# Patient Record
Sex: Male | Born: 1950
Health system: Southern US, Community
[De-identification: ages and names within clinical notes are randomized; demographics above are authoritative.]

## PROBLEM LIST (undated history)

## (undated) DIAGNOSIS — H269 Unspecified cataract: Secondary | ICD-10-CM

## (undated) DIAGNOSIS — E119 Type 2 diabetes mellitus without complications: Secondary | ICD-10-CM

## (undated) DIAGNOSIS — Z9989 Dependence on other enabling machines and devices: Secondary | ICD-10-CM

## (undated) DIAGNOSIS — G20 Parkinson's disease: Secondary | ICD-10-CM

## (undated) DIAGNOSIS — M51369 Other intervertebral disc degeneration, lumbar region without mention of lumbar back pain or lower extremity pain: Secondary | ICD-10-CM

## (undated) DIAGNOSIS — G4730 Sleep apnea, unspecified: Secondary | ICD-10-CM

## (undated) DIAGNOSIS — K219 Gastro-esophageal reflux disease without esophagitis: Secondary | ICD-10-CM

## (undated) DIAGNOSIS — G709 Myoneural disorder, unspecified: Secondary | ICD-10-CM

## (undated) DIAGNOSIS — I10 Essential (primary) hypertension: Secondary | ICD-10-CM

## (undated) DIAGNOSIS — G20A1 Parkinson's disease without dyskinesia, without mention of fluctuations: Secondary | ICD-10-CM

## (undated) DIAGNOSIS — F419 Anxiety disorder, unspecified: Secondary | ICD-10-CM

## (undated) DIAGNOSIS — J189 Pneumonia, unspecified organism: Secondary | ICD-10-CM

## (undated) DIAGNOSIS — J45909 Unspecified asthma, uncomplicated: Secondary | ICD-10-CM

## (undated) DIAGNOSIS — G4733 Obstructive sleep apnea (adult) (pediatric): Secondary | ICD-10-CM

## (undated) DIAGNOSIS — E785 Hyperlipidemia, unspecified: Secondary | ICD-10-CM

## (undated) DIAGNOSIS — M5136 Other intervertebral disc degeneration, lumbar region: Secondary | ICD-10-CM

## (undated) DIAGNOSIS — T7840XA Allergy, unspecified, initial encounter: Secondary | ICD-10-CM

## (undated) HISTORY — DX: Anxiety disorder, unspecified: F41.9

## (undated) HISTORY — DX: Unspecified asthma, uncomplicated: J45.909

## (undated) HISTORY — PX: JOINT REPLACEMENT: SHX530

## (undated) HISTORY — PX: SHOULDER ARTHROSCOPY W/ ROTATOR CUFF REPAIR: SHX2400

## (undated) HISTORY — DX: Sleep apnea, unspecified: G47.30

## (undated) HISTORY — PX: FRACTURE SURGERY: SHX138

## (undated) HISTORY — DX: Parkinson's disease: G20

## (undated) HISTORY — DX: Other intervertebral disc degeneration, lumbar region without mention of lumbar back pain or lower extremity pain: M51.369

## (undated) HISTORY — DX: Myoneural disorder, unspecified: G70.9

## (undated) HISTORY — DX: Dependence on other enabling machines and devices: Z99.89

## (undated) HISTORY — DX: Allergy, unspecified, initial encounter: T78.40XA

## (undated) HISTORY — DX: Gastro-esophageal reflux disease without esophagitis: K21.9

## (undated) HISTORY — DX: Parkinson's disease without dyskinesia, without mention of fluctuations: G20.A1

## (undated) HISTORY — DX: Hyperlipidemia, unspecified: E78.5

## (undated) HISTORY — DX: Unspecified cataract: H26.9

## (undated) HISTORY — DX: Obstructive sleep apnea (adult) (pediatric): G47.33

## (undated) HISTORY — PX: URETERAL STENT PLACEMENT: SHX822

## (undated) HISTORY — DX: Essential (primary) hypertension: I10

## (undated) HISTORY — DX: Other intervertebral disc degeneration, lumbar region: M51.36

---
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
## 1980-08-12 HISTORY — PX: NASAL SEPTUM SURGERY: SHX37

## 1999-08-13 HISTORY — PX: CARPAL TUNNEL RELEASE: SHX101

## 2002-01-05 ENCOUNTER — Ambulatory Visit (HOSPITAL_BASED_OUTPATIENT_CLINIC_OR_DEPARTMENT_OTHER): Admission: RE | Admit: 2002-01-05 | Discharge: 2002-01-06 | Payer: Self-pay | Admitting: Orthopaedic Surgery

## 2006-03-04 ENCOUNTER — Ambulatory Visit (HOSPITAL_COMMUNITY): Admission: RE | Admit: 2006-03-04 | Discharge: 2006-03-05 | Payer: Self-pay | Admitting: Orthopaedic Surgery

## 2006-08-30 ENCOUNTER — Emergency Department (HOSPITAL_COMMUNITY): Admission: EM | Admit: 2006-08-30 | Discharge: 2006-08-30 | Payer: Self-pay | Admitting: Emergency Medicine

## 2006-09-05 ENCOUNTER — Ambulatory Visit (HOSPITAL_BASED_OUTPATIENT_CLINIC_OR_DEPARTMENT_OTHER): Admission: RE | Admit: 2006-09-05 | Discharge: 2006-09-05 | Payer: Self-pay | Admitting: Urology

## 2006-09-19 ENCOUNTER — Ambulatory Visit (HOSPITAL_BASED_OUTPATIENT_CLINIC_OR_DEPARTMENT_OTHER): Admission: RE | Admit: 2006-09-19 | Discharge: 2006-09-19 | Payer: Self-pay | Admitting: Urology

## 2010-08-12 DIAGNOSIS — I10 Essential (primary) hypertension: Secondary | ICD-10-CM

## 2010-08-12 HISTORY — DX: Essential (primary) hypertension: I10

## 2010-12-20 ENCOUNTER — Encounter: Payer: Self-pay | Admitting: Family Medicine

## 2010-12-20 ENCOUNTER — Ambulatory Visit (INDEPENDENT_AMBULATORY_CARE_PROVIDER_SITE_OTHER): Payer: BC Managed Care – PPO | Admitting: Family Medicine

## 2010-12-20 DIAGNOSIS — R5383 Other fatigue: Secondary | ICD-10-CM | POA: Insufficient documentation

## 2010-12-20 DIAGNOSIS — R7301 Impaired fasting glucose: Secondary | ICD-10-CM

## 2010-12-20 DIAGNOSIS — E785 Hyperlipidemia, unspecified: Secondary | ICD-10-CM | POA: Insufficient documentation

## 2010-12-20 DIAGNOSIS — R635 Abnormal weight gain: Secondary | ICD-10-CM | POA: Insufficient documentation

## 2010-12-20 DIAGNOSIS — R5381 Other malaise: Secondary | ICD-10-CM

## 2010-12-20 DIAGNOSIS — I10 Essential (primary) hypertension: Secondary | ICD-10-CM | POA: Insufficient documentation

## 2010-12-20 MED ORDER — PHENTERMINE HCL 15 MG PO CAPS: 15.0000 mg | ORAL_CAPSULE | ORAL | Status: DC

## 2010-12-20 MED ORDER — SIMVASTATIN 10 MG PO TABS: 10.0000 mg | ORAL_TABLET | Freq: Every day | ORAL | Status: DC

## 2010-12-20 MED ORDER — OLMESARTAN MEDOXOMIL-HCTZ 40-25 MG PO TABS: 1.0000 | ORAL_TABLET | Freq: Every day | ORAL | Status: DC

## 2010-12-20 NOTE — Assessment & Plan Note (Signed)
BMI 46 with co-morbidities of OSA, dyslipidemia, IFG, HTN and LBP. He is very motivated to make changes but has struggled with weight regain and the habit of snacking at night.  We discussed the needed diet changes and plain for exercise in his routine.  Will add low dose phentermine 15 mg 2 hrs after breakfast that way it covers his late night snacking but hopefully does not cause insomnia.  Covered the common adverse SEs and he will call if any problems.  RTC for nurse BP/WT/ HR check in 1 month and to see me in 2 mos.  Goal for wt loss: 1-2 lbs/ wk.

## 2010-12-20 NOTE — Patient Instructions (Addendum)
EKG today. OK   Update fasting labs one morning downstairs. Will call you w/ results.  Start Phentermine 15 mg tab once daily (take 2 hrs after breakfast).  Return in 1 month for a nurse WT/ BP check and in 2 mos to see me. Call if any problems.

## 2010-12-20 NOTE — Assessment & Plan Note (Addendum)
EKG done today shows 63  NSR at bpm.  Left axis deviation, Qtc 413 ms, PR 188 ms, no sign of ischemia or arrhytmia. BP well controlled on Benicar/ HCTZ.  Denies any symptoms of angina and EKG is normal.  Definitely needs to work on weight loss, focusing on lean proteins, lots of veggies, some fruit, limited meals out and processed foods ( to reduce sodium intake).    Will update labs today and carefully watch his BP/ HR on phentermine.

## 2010-12-20 NOTE — Progress Notes (Signed)
Subjective:    Patient ID: Jonathan Yu, male    DOB: 1951/02/27, 60 y.o.   MRN: [id number]  HPI 60 yo WM presents for NOV.  He has a hx of IFG, HTN and dyslipidemia.  He is struggling to lose weight.  Last year, after being diagnosed with IFG, he lost 35 lbs on his own but has regained 30 lbs.  He denies chest pain or DOE.  He is doing well on his current meds, not on anything for his sugar.  His AM fastings are 110-160s.  Admits to a fairly poor diet and late night snacking.  Time is a factor.  He works long hrs and has a 58 yr old at home.  He would like to start going back to the gym to do weights and cardio.  He has lumbar DDD -- improved by seeing a chiropractor and has had RTC syndrome bilat which has kept him from lifting wts.  He tried OTC SENSA but it has not helped him lose weight.  Denies tremor, insomnia or heart palpitations.  Had labs done in 2011.  C/O feeling more tired since gaining weight.  BP 125/77  Pulse 85  Ht 5\' 6"  (1.676 m)  Wt 289 lb (131.09 kg)  BMI 46.65 kg/m2  SpO2 96%  Past Medical History  Diagnosis Date  . Allergy   . Hyperlipidemia   . Hypertension   . Diabetes mellitus   . DDD (degenerative disc disease), lumbar   . OSA on CPAP     Past Surgical History  Procedure Date  . Rtc repair   . Ureteral stent     Family History  Problem Relation Age of Onset  . Cancer Mother 73    Lung    History   Social History  . Marital Status: Single    Spouse Name: N/A    Number of Children: 5  . Years of Education: N/A   Occupational History  . sales/ DJ    Social History Main Topics  . Smoking status: Current Some Day Smoker    Types: Pipe, Cigars  . Smokeless tobacco: Not on file  . Alcohol Use: Yes  . Drug Use: No  . Sexually Active: Yes   Other Topics Concern  . Not on file   Social History Narrative  . No narrative on file    Not on File  Current outpatient prescriptions:Ascorbic Acid (VITAMIN C) 1000 MG tablet, Take 1,000 mg by  mouth daily.  , Disp: , Rfl: ;  aspirin 81 MG tablet, Take 81 mg by mouth daily.  , Disp: , Rfl: ;  cyclobenzaprine (FLEXERIL) 10 MG tablet, Take 10 mg by mouth at bedtime as needed.  , Disp: , Rfl: ;  DHEA 50 MG CAPS, Take by mouth.  , Disp: , Rfl: ;  fish oil-omega-3 fatty acids 1000 MG capsule, Take 2 g by mouth daily.  , Disp: , Rfl:  fluticasone (FLONASE) 50 MCG/ACT nasal spray, 2 sprays by Nasal route daily.  , Disp: , Rfl: ;  ibuprofen (ADVIL,MOTRIN) 800 MG tablet, Take 800 mg by mouth every 8 (eight) hours as needed.  , Disp: , Rfl: ;  Loratadine 10 MG CAPS, Take by mouth 2 (two) times daily.  , Disp: , Rfl: ;  Minoxidil (ROGAINE EXTRA STRENGTH FOR MEN EX), Apply topically.  , Disp: , Rfl:  olmesartan-hydrochlorothiazide (BENICAR HCT) 40-25 MG per tablet, Take 1 tablet by mouth daily., Disp: 90 tablet, Rfl: 1;  simvastatin (ZOCOR) 10  MG tablet, Take 1 tablet (10 mg total) by mouth at bedtime., Disp: 90 tablet, Rfl: 0;  Vitamin Mixture (CHROMIUM PICOLINATE PO), Take by mouth.  , Disp: , Rfl: ;  DISCONTD: olmesartan-hydrochlorothiazide (BENICAR HCT) 40-25 MG per tablet, Take 1 tablet by mouth daily.  , Disp: , Rfl:  DISCONTD: simvastatin (ZOCOR) 10 MG tablet, Take 10 mg by mouth at bedtime.  , Disp: , Rfl: ;  fexofenadine (ALLEGRA) 180 MG tablet, Take 180 mg by mouth daily.  , Disp: , Rfl: ;  phentermine 15 MG capsule, Take 1 capsule (15 mg total) by mouth every morning., Disp: 30 capsule, Rfl: 0    Review of Systems  Constitutional: Positive for fatigue and unexpected weight change. Negative for appetite change. Fever: gain.  Eyes: Negative for visual disturbance.  Respiratory: Negative for shortness of breath.   Cardiovascular: Negative for chest pain, palpitations and leg swelling.  Gastrointestinal: Negative for nausea, abdominal pain and diarrhea.  Genitourinary: Negative for frequency, decreased urine volume and difficulty urinating.  Musculoskeletal: Positive for back pain.  Neurological:  Negative for numbness and headaches.  Psychiatric/Behavioral: Negative for sleep disturbance and dysphoric mood. The patient is not nervous/anxious.         Objective:   Physical Exam  Constitutional: He appears well-developed and well-nourished.       Morbidly obese  Eyes: Conjunctivae are normal. No scleral icterus.  Neck: Neck supple. No thyromegaly present.  Cardiovascular: Normal rate, regular rhythm and normal heart sounds.   No murmur heard. Pulmonary/Chest: Effort normal and breath sounds normal. No respiratory distress. He has no wheezes.  Abdominal: Soft. Bowel sounds are normal. He exhibits no distension. There is no tenderness.  Musculoskeletal: He exhibits no edema.  Lymphadenopathy:    He has no cervical adenopathy.  Skin: Skin is warm and dry.  Psychiatric: He has a normal mood and affect.          Assessment & Plan:

## 2010-12-20 NOTE — Assessment & Plan Note (Signed)
Update fasting labs. On Zocor.  Will definitely need to work on diet/ exercise/ wt loss.  If seeing significant improvements in weight in 6 mos, will recheck labs.

## 2010-12-20 NOTE — Assessment & Plan Note (Signed)
Will get an A1C with his labs today.  If > 6.5. Would start metformin.  Work on diet, exercise, wt loss essential.

## 2010-12-21 ENCOUNTER — Emergency Department (HOSPITAL_BASED_OUTPATIENT_CLINIC_OR_DEPARTMENT_OTHER)
Admission: EM | Admit: 2010-12-21 | Discharge: 2010-12-21 | Disposition: A | Discharge status: Home / Self Care | Payer: No Typology Code available for payment source | Attending: Emergency Medicine | Admitting: Emergency Medicine

## 2010-12-21 DIAGNOSIS — I10 Essential (primary) hypertension: Secondary | ICD-10-CM | POA: Insufficient documentation

## 2010-12-21 DIAGNOSIS — M549 Dorsalgia, unspecified: Secondary | ICD-10-CM | POA: Insufficient documentation

## 2010-12-21 DIAGNOSIS — Y92410 Unspecified street and highway as the place of occurrence of the external cause: Secondary | ICD-10-CM | POA: Insufficient documentation

## 2010-12-21 DIAGNOSIS — E7800 Pure hypercholesterolemia, unspecified: Secondary | ICD-10-CM | POA: Insufficient documentation

## 2010-12-21 DIAGNOSIS — R109 Unspecified abdominal pain: Secondary | ICD-10-CM | POA: Insufficient documentation

## 2010-12-21 LAB — URINALYSIS, ROUTINE W REFLEX MICROSCOPIC
Bilirubin Urine: NEGATIVE
Glucose, UA: NEGATIVE mg/dL
Hgb urine dipstick: NEGATIVE
Ketones, ur: NEGATIVE mg/dL
Nitrite: NEGATIVE
Protein, ur: NEGATIVE mg/dL
Specific Gravity, Urine: 1.017 (ref 1.005–1.030)
Urobilinogen, UA: 0.2 mg/dL (ref 0.0–1.0)
pH: 7 (ref 5.0–8.0)

## 2011-01-09 ENCOUNTER — Encounter: Payer: Self-pay | Admitting: Family Medicine

## 2011-01-15 ENCOUNTER — Telehealth: Payer: Self-pay | Admitting: Family Medicine

## 2011-01-15 LAB — COMPLETE METABOLIC PANEL WITH GFR
ALT: 41 U/L (ref 0–53)
AST: 23 U/L (ref 0–37)
Albumin: 4.4 g/dL (ref 3.5–5.2)
Alkaline Phosphatase: 45 U/L (ref 39–117)
BUN: 15 mg/dL (ref 6–23)
CO2: 30 mEq/L (ref 19–32)
Calcium: 9.9 mg/dL (ref 8.4–10.5)
Chloride: 101 mEq/L (ref 96–112)
Creat: 0.98 mg/dL (ref 0.50–1.35)
GFR, Est African American: >60 mL/min (ref >60)
GFR, Est Non African American: >60 mL/min (ref >60)
Glucose, Bld: 133 mg/dL — ABNORMAL HIGH (ref 70–99)
Potassium: 4.4 mEq/L (ref 3.5–5.3)
Sodium: 139 mEq/L (ref 135–145)
Total Bilirubin: 1.4 mg/dL — ABNORMAL HIGH (ref 0.3–1.2)
Total Protein: 7 g/dL (ref 6.0–8.3)

## 2011-01-15 LAB — CBC WITH DIFFERENTIAL/PLATELET
Basophils Absolute: 0 10*3/uL (ref 0.0–0.1)
Basophils Relative: 0 % (ref 0–1)
Eosinophils Absolute: 0.2 10*3/uL (ref 0.0–0.7)
Eosinophils Relative: 3 % (ref 0–5)
HCT: 47.7 % (ref 39.0–52.0)
Hemoglobin: 15.6 g/dL (ref 13.0–17.0)
Lymphocytes Relative: 41 % (ref 12–46)
Lymphs Abs: 3.1 10*3/uL (ref 0.7–4.0)
MCH: 29.7 pg (ref 26.0–34.0)
MCHC: 32.7 g/dL (ref 30.0–36.0)
MCV: 90.7 fL (ref 78.0–100.0)
Monocytes Absolute: 0.6 10*3/uL (ref 0.1–1.0)
Monocytes Relative: 8 % (ref 3–12)
Neutro Abs: 3.6 10*3/uL (ref 1.7–7.7)
Neutrophils Relative %: 48 % (ref 43–77)
Platelets: 270 10*3/uL (ref 150–400)
RBC: 5.26 MIL/uL (ref 4.22–5.81)
RDW: 12.9 % (ref 11.5–15.5)
WBC: 7.6 10*3/uL (ref 4.0–10.5)

## 2011-01-15 LAB — TESTOSTERONE, FREE, TOTAL, SHBG
Sex Hormone Binding: 17 nmol/L (ref 13–71)
Testosterone, Free: 55.5 pg/mL (ref 47.0–244.0)
Testosterone-% Free: 2.7 % (ref 1.6–2.9)
Testosterone: 209.5 ng/dL — ABNORMAL LOW (ref 250–890)

## 2011-01-15 LAB — LIPID PANEL
Cholesterol: 174 mg/dL (ref 0–200)
HDL: 37 mg/dL — ABNORMAL LOW (ref >39)
LDL Cholesterol: 87 mg/dL (ref 0–99)
Total CHOL/HDL Ratio: 4.7 Ratio
Triglycerides: 251 mg/dL — ABNORMAL HIGH (ref <150)
VLDL: 50 mg/dL — ABNORMAL HIGH (ref 0–40)

## 2011-01-15 LAB — HEMOGLOBIN A1C
Hgb A1c MFr Bld: 6.8 % — ABNORMAL HIGH (ref <5.7)
Mean Plasma Glucose: 148 mg/dL — ABNORMAL HIGH (ref <117)

## 2011-01-15 LAB — TSH: TSH: 1.39 u[IU]/mL (ref 0.350–4.500)

## 2011-01-15 LAB — VITAMIN B12: Vitamin B-12: 525 pg/mL (ref 211–911)

## 2011-01-15 NOTE — Telephone Encounter (Signed)
LMOM for Pt to CB to schedule OV to discuss labs

## 2011-01-15 NOTE — Telephone Encounter (Signed)
Pls call pt and schedule him an office visit in the next wk to review lab results in person.

## 2011-01-23 ENCOUNTER — Ambulatory Visit: Payer: BC Managed Care – PPO | Admitting: Family Medicine

## 2011-01-24 ENCOUNTER — Encounter: Payer: Self-pay | Admitting: Family Medicine

## 2011-01-24 ENCOUNTER — Ambulatory Visit (INDEPENDENT_AMBULATORY_CARE_PROVIDER_SITE_OTHER): Payer: BC Managed Care – PPO | Admitting: Family Medicine

## 2011-01-24 VITALS — BP 119/70 | HR 78 | Ht 66.5 in | Wt 284.0 lb

## 2011-01-24 DIAGNOSIS — E291 Testicular hypofunction: Secondary | ICD-10-CM

## 2011-01-24 DIAGNOSIS — G4733 Obstructive sleep apnea (adult) (pediatric): Secondary | ICD-10-CM | POA: Insufficient documentation

## 2011-01-24 DIAGNOSIS — M5416 Radiculopathy, lumbar region: Secondary | ICD-10-CM | POA: Insufficient documentation

## 2011-01-24 DIAGNOSIS — IMO0002 Reserved for concepts with insufficient information to code with codable children: Secondary | ICD-10-CM

## 2011-01-24 DIAGNOSIS — E349 Endocrine disorder, unspecified: Secondary | ICD-10-CM | POA: Insufficient documentation

## 2011-01-24 DIAGNOSIS — E119 Type 2 diabetes mellitus without complications: Secondary | ICD-10-CM | POA: Insufficient documentation

## 2011-01-24 DIAGNOSIS — Z9989 Dependence on other enabling machines and devices: Secondary | ICD-10-CM | POA: Insufficient documentation

## 2011-01-24 DIAGNOSIS — I10 Essential (primary) hypertension: Secondary | ICD-10-CM

## 2011-01-24 DIAGNOSIS — E785 Hyperlipidemia, unspecified: Secondary | ICD-10-CM

## 2011-01-24 MED ORDER — PHENTERMINE HCL 30 MG PO CAPS: 30.0000 mg | ORAL_CAPSULE | ORAL | Status: DC

## 2011-01-24 MED ORDER — METFORMIN HCL ER 500 MG PO TB24: 500.0000 mg | ORAL_TABLET | Freq: Every day | ORAL | Status: DC
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
## 2011-01-24 NOTE — Assessment & Plan Note (Addendum)
BP looks great on Benicar/ HCTZ.  Labs recently done.  No increase in BP/ HR on Phentermine.

## 2011-01-24 NOTE — Assessment & Plan Note (Signed)
Doing well on Zocor but has high TGs and a low HDL which are diet/ obesity related.  Plan to recheck once > 20 lbs lost.
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
## 2011-01-24 NOTE — Assessment & Plan Note (Signed)
His testosterone (total ) did come back low along with a low normal SHBG (likely from obesity) and a low normal free testerosterone.  Will hold out on treatment at this time since we have other new meds to start today.  F/u in 1 month.

## 2011-01-24 NOTE — Assessment & Plan Note (Addendum)
BMi 47 c/w class III obesity complicated by T2DM, dyslipidemia, HTN, OSA. BP/HR good on phentermine and it did help him cut back on portion sizes.  New RX for 30 mg given.  F/u in 1 month.  Use this along with a low sugar/ low carb diet, avoiding late night snacking and aim for 1 hr exercise 4-5 days/ wk.

## 2011-01-24 NOTE — Assessment & Plan Note (Signed)
Newly diagnosed.  A1C is 6.8.  We discussed what this means.  He has a glucometer at home and will start checking an AM fasting and 2 hr after dinner reading.  Will set him up to see a nutritionist.  His dilated eye exam is UTD. Plan to do a Pneumovax, urine microalbumin and monofilament at his next visit.    Start metformin XR 500 mg once daily with breakfast.  Work on diet/ exercise/ wt loss.

## 2011-01-24 NOTE — Patient Instructions (Signed)
MRI L spine ordered for this weekend.  Stay on Advil as needed for pain (800 mg 3 x a day max) for now.  Start Metformin 500 mg XR with breakfast daily for sugars.  Dietary referral made for nutrition counseling. Work on low sugar/ low carb diet and aim for 1 hr of exercise 4- 5 days/ wk.  Add Phentermine each AM, 30 min before breakfast. Call me if any problems.  Return for f/u visit in 1 month.

## 2011-01-25 ENCOUNTER — Other Ambulatory Visit: Payer: BC Managed Care – PPO

## 2011-01-28 ENCOUNTER — Encounter: Payer: Self-pay | Admitting: Family Medicine

## 2011-01-28 ENCOUNTER — Ambulatory Visit
Admission: RE | Admit: 2011-01-28 | Discharge: 2011-01-28 | Disposition: A | Discharge status: Home / Self Care | Payer: BC Managed Care – PPO | Source: Ambulatory Visit | Attending: Family Medicine | Admitting: Family Medicine

## 2011-01-28 DIAGNOSIS — N200 Calculus of kidney: Secondary | ICD-10-CM | POA: Insufficient documentation

## 2011-01-28 DIAGNOSIS — E119 Type 2 diabetes mellitus without complications: Secondary | ICD-10-CM

## 2011-01-28 MED ORDER — GADOBENATE DIMEGLUMINE 529 MG/ML IV SOLN
20.0000 mL | Freq: Once | INTRAVENOUS | Status: AC | PRN
  Administered 2011-01-28: 20 mL via INTRAVENOUS

## 2011-01-29 ENCOUNTER — Telehealth: Payer: Self-pay | Admitting: Family Medicine

## 2011-01-29 DIAGNOSIS — M5136 Other intervertebral disc degeneration, lumbar region: Secondary | ICD-10-CM

## 2011-01-29 NOTE — Telephone Encounter (Signed)
Pls let pt know that he has disc dz at L4-5 that is pushing on the Left L5 nerve root.  Will get him in with Dr Shon Baton downstairs (orthopedist downstairs who  Specializes in backs).

## 2011-01-29 NOTE — Telephone Encounter (Signed)
Pt aware of the above  
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
## 2011-02-20 ENCOUNTER — Ambulatory Visit (INDEPENDENT_AMBULATORY_CARE_PROVIDER_SITE_OTHER): Payer: BC Managed Care – PPO | Admitting: Family Medicine

## 2011-02-20 ENCOUNTER — Encounter: Payer: Self-pay | Admitting: Family Medicine

## 2011-02-20 VITALS — BP 124/76 | HR 73 | Wt 279.0 lb

## 2011-02-20 DIAGNOSIS — E669 Obesity, unspecified: Secondary | ICD-10-CM

## 2011-02-20 DIAGNOSIS — E291 Testicular hypofunction: Secondary | ICD-10-CM

## 2011-02-20 MED ORDER — PHENTERMINE HCL 30 MG PO CAPS: 30.0000 mg | ORAL_CAPSULE | ORAL | Status: DC

## 2011-02-20 MED ORDER — TESTOSTERONE CYPIONATE 200 MG/ML IM SOLN
200.0000 mg | Freq: Once | INTRAMUSCULAR | Status: AC
  Administered 2011-02-20: 200 mg via INTRAMUSCULAR

## 2011-02-20 NOTE — Progress Notes (Signed)
  Subjective:    Patient ID: Jonathan Yu, male    DOB: October 06, 1950, 60 y.o.   MRN: [id number]  HPI  Pt denies chest pain, SOB, dizziness, or heart palpitations.  Taking meds as directed w/o problems.  Denies medication side effects.  5 min spent with pt. Patient doing well on appetite suppressant.  Here for nurse visit, weight, BP, HR check.  Denies problems with insomnia, heart palpitations or tremors.  Satisfied with weight loss thus far and is working on [employer] and regular exercise.       Review of Systems     Objective:   Physical Exam        Assessment & Plan:

## 2011-02-20 NOTE — Progress Notes (Signed)
  Subjective:    Patient ID: Jonathan Yu, male    DOB: 25-May-1951, 60 y.o.   MRN: [id number]  HPI  Patient doing well on appetite suppressant.  Here for nurse visit, weight, BP, HR check.  Denies problems with insomnia, heart palpitations or tremors.  Satisfied with weight loss thus far and is working on [employer] and regular exercise.    Review of Systems     Objective:   Physical Exam        Assessment & Plan:  Obesity:  Continue Phentermine.  He has lost 5 lbs in the first 4 wks.  BMI is down from 45 to 44.  His BP and HR look perfect.  He has co- morbidities of HTN, hyperlipidemia, DM, OSA and LBP.   Low Testosterone:  Will start on Testosterone Cypionate 200 mg IM once a month.  Recheck labs in 3 mos.  Will contact [facility] for his most up to date PSA.

## 2011-02-20 NOTE — Patient Instructions (Signed)
Start Testosterone 200 mg IM once a month.  PSA at the lab today downstairs.  Stay on Phentermine.  Return for f/u in 1 month (office visit)

## 2011-02-28 ENCOUNTER — Ambulatory Visit: Payer: BC Managed Care – PPO | Admitting: Family Medicine

## 2011-03-11 ENCOUNTER — Ambulatory Visit: Payer: BC Managed Care – PPO | Admitting: Family Medicine

## 2011-03-25 ENCOUNTER — Ambulatory Visit (INDEPENDENT_AMBULATORY_CARE_PROVIDER_SITE_OTHER): Payer: BC Managed Care – PPO | Admitting: Family Medicine

## 2011-03-25 VITALS — BP 112/74 | HR 87 | Ht 66.5 in | Wt 275.0 lb

## 2011-03-25 DIAGNOSIS — E291 Testicular hypofunction: Secondary | ICD-10-CM

## 2011-03-25 MED ORDER — TESTOSTERONE CYPIONATE 200 MG/ML IM SOLN
200.0000 mg | INTRAMUSCULAR | Status: DC
  Administered 2011-03-25: 200 mg via INTRAMUSCULAR

## 2011-03-25 NOTE — Progress Notes (Signed)
  Subjective:    Patient ID: Jonathan Yu, male    DOB: 10/27/1950, 60 y.o.   MRN: [id number]  HPI  Pt denies chest pain, SOB, dizziness, or heart palpitations.  Taking meds as directed w/o problems.  Denies medication side effects.  5 min spent with pt. Patient doing well on appetite suppressant.  Here for nurse visit, weight, BP, HR check.  Denies problems with insomnia, heart palpitations or tremors.  Satisfied with weight loss thus far and is working on [employer] and regular exercise.   Testosterone inj given  Also here for testosterone injection  Review of Systems     Objective:   Physical Exam        Assessment & Plan:  His blood pressure is well controlled and he has lost 4 more pounds which is fantastic.    Testosterone administered.

## 2011-03-27 MED ORDER — PHENTERMINE HCL 30 MG PO CAPS: 30.0000 mg | ORAL_CAPSULE | ORAL | Status: DC

## 2011-03-27 NOTE — Progress Notes (Signed)
Addended by: Ellsworth Lennox on: 03/27/2011 09:47 AM   Modules accepted: Orders

## 2011-04-01 ENCOUNTER — Other Ambulatory Visit: Payer: Self-pay | Admitting: Family Medicine

## 2011-04-25 ENCOUNTER — Ambulatory Visit (INDEPENDENT_AMBULATORY_CARE_PROVIDER_SITE_OTHER): Payer: BC Managed Care – PPO | Admitting: Family Medicine

## 2011-04-25 ENCOUNTER — Encounter: Payer: Self-pay | Admitting: Family Medicine

## 2011-04-25 VITALS — BP 100/62 | HR 76 | Wt 252.0 lb

## 2011-04-25 DIAGNOSIS — E119 Type 2 diabetes mellitus without complications: Secondary | ICD-10-CM

## 2011-04-25 DIAGNOSIS — I10 Essential (primary) hypertension: Secondary | ICD-10-CM

## 2011-04-25 DIAGNOSIS — J069 Acute upper respiratory infection, unspecified: Secondary | ICD-10-CM

## 2011-04-25 DIAGNOSIS — Z23 Encounter for immunization: Secondary | ICD-10-CM

## 2011-04-25 DIAGNOSIS — E291 Testicular hypofunction: Secondary | ICD-10-CM

## 2011-04-25 LAB — POCT GLYCOSYLATED HEMOGLOBIN (HGB A1C): Hemoglobin A1C: 5.8

## 2011-04-25 MED ORDER — PHENTERMINE HCL 30 MG PO CAPS: 30.0000 mg | ORAL_CAPSULE | ORAL | Status: DC

## 2011-04-25 MED ORDER — TESTOSTERONE CYPIONATE 200 MG/ML IM SOLN
200.0000 mg | Freq: Once | INTRAMUSCULAR | Status: AC
  Administered 2011-04-25: 200 mg via INTRAMUSCULAR

## 2011-04-25 MED ORDER — OLMESARTAN MEDOXOMIL-HCTZ 20-12.5 MG PO TABS: 1.0000 | ORAL_TABLET | Freq: Every day | ORAL | Status: DC

## 2011-04-25 NOTE — Patient Instructions (Addendum)
Decrease the Benicar. Recheck BP in one month with a nurse visit.   Stop the metformin and keep up the good work with your diet and exercise.

## 2011-04-25 NOTE — Progress Notes (Signed)
  Subjective:    Patient ID: Jonathan Yu, male    DOB: 12-Nov-1950, 60 y.o.   MRN: [id number]  HPI Has lost 26 lbs in the last moth. He has been using some over-the-counter antioxidant drinks. Has battled with his weight since 3rd grade.  Had some dizziness when bends over.  Strated about a month ago.  EVery time bent down and then got up would feel dizzy.  Sugars were normal. Out of glucose strips for the last month.  Diabetes-he says he is compliant with his medications. He has not checked his sugars in the last month because he has been out of strips. He denies any hypoglycemic episodes. No recent chest pain or short of breath.  URI sxs started last night.' ST and rhinorrhea.  Feels a little achey. Positive sick contact. No fever. Not using any over-the-counter cold medications.  Review of Systems     Objective:   Physical Exam  Constitutional: He is oriented to person, place, and time. He appears well-developed and well-nourished.  HENT:  Head: Normocephalic and atraumatic.  Right Ear: External ear normal.  Left Ear: External ear normal.  Nose: Nose normal.  Mouth/Throat: Oropharynx is clear and moist.       TMs and canals are clear.   Eyes: Conjunctivae and EOM are normal. Pupils are equal, round, and reactive to light.  Neck: Neck supple. No thyromegaly present.  Cardiovascular: Normal rate, regular rhythm and normal heart sounds.   Pulmonary/Chest: Effort normal and breath sounds normal.  Lymphadenopathy:    He has no cervical adenopathy.  Neurological: He is alert and oriented to person, place, and time.  Skin: Skin is warm and dry.  Psychiatric: He has a normal mood and affect. His behavior is normal.          Assessment & Plan:  HTN- Will decrease his Benicar to half of a tab. I think she will do well on this. I did ask that he come in for nurse blood pressure check in one month. If his blood pressure is still on the low end and we can consider discontinuing the ARB  and sticking with just the diuretic. He says he doesn't like taking at least a small amount of diuretic to help with lower extremity swelling. Continue eating a low salt diet.  Diabetes-he is extremely well controlled. His A1c looks fantastic today at 5.8. In fact I'm going to stop his metformin. I think his weight loss has made significant difference in his diabetic control. I was still like to see him back in 3 months and recheck an A1c off of the medication to make sure that he is maintaining control. Hopefully he will maintain dietary control. I congratulated him on his recent weight loss and encouraged him to continue with this.  Obesity-I did refill his phentermine today. He is doing fantastic.  URI-likely viral. Call if symptoms worsen or not better in one week. Handout given for acute symptomatic treatments.

## 2011-04-26 LAB — POCT UA - MICROALBUMIN

## 2011-05-02 ENCOUNTER — Telehealth: Payer: Self-pay | Admitting: *Deleted

## 2011-05-02 NOTE — Telephone Encounter (Signed)
Stop the benicar completely. Just monitor BP

## 2011-05-02 NOTE — Telephone Encounter (Signed)
Pt called stating he is still feeling dizzy at times. He is taking 1/2 benicar daily and BP is 112/68.

## 2011-05-02 NOTE — Telephone Encounter (Signed)
Pt aware.

## 2011-05-13 HISTORY — PX: ANKLE RECONSTRUCTION: SHX1151

## 2011-05-22 ENCOUNTER — Telehealth: Payer: Self-pay | Admitting: Family Medicine

## 2011-05-22 ENCOUNTER — Telehealth: Payer: Self-pay | Admitting: *Deleted

## 2011-05-22 DIAGNOSIS — S82891B Other fracture of right lower leg, initial encounter for open fracture type I or II: Secondary | ICD-10-CM

## 2011-05-22 DIAGNOSIS — S82209B Unspecified fracture of shaft of unspecified tibia, initial encounter for open fracture type I or II: Secondary | ICD-10-CM
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
## 2011-05-22 NOTE — Telephone Encounter (Signed)
Need referral to Dr. Lajoyce Corners for f/u

## 2011-05-24 ENCOUNTER — Telehealth: Payer: Self-pay | Admitting: Family Medicine
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
## 2011-05-28 ENCOUNTER — Ambulatory Visit: Payer: BC Managed Care – PPO

## 2011-05-29 ENCOUNTER — Other Ambulatory Visit: Payer: Self-pay | Admitting: *Deleted

## 2011-05-29 MED ORDER — HYDROCODONE-ACETAMINOPHEN 10-500 MG PO TABS: ORAL_TABLET | ORAL | Status: DC

## 2011-06-06 ENCOUNTER — Other Ambulatory Visit: Payer: Self-pay | Admitting: Orthopedic Surgery

## 2011-06-06 ENCOUNTER — Ambulatory Visit (INDEPENDENT_AMBULATORY_CARE_PROVIDER_SITE_OTHER): Payer: BC Managed Care – PPO | Admitting: Family Medicine

## 2011-06-06 ENCOUNTER — Ambulatory Visit
Admission: RE | Admit: 2011-06-06 | Discharge: 2011-06-06 | Disposition: A | Discharge status: Home / Self Care | Payer: BC Managed Care – PPO | Source: Ambulatory Visit | Attending: Orthopedic Surgery | Admitting: Orthopedic Surgery

## 2011-06-06 VITALS — BP 129/75 | HR 78 | Wt 236.0 lb

## 2011-06-06 DIAGNOSIS — S82899A Other fracture of unspecified lower leg, initial encounter for closed fracture: Secondary | ICD-10-CM

## 2011-06-06 DIAGNOSIS — E291 Testicular hypofunction: Secondary | ICD-10-CM

## 2011-06-06 DIAGNOSIS — E669 Obesity, unspecified: Secondary | ICD-10-CM

## 2011-06-06 DIAGNOSIS — E785 Hyperlipidemia, unspecified: Secondary | ICD-10-CM

## 2011-06-06 MED ORDER — PHENTERMINE HCL 30 MG PO CAPS: 30.0000 mg | ORAL_CAPSULE | ORAL | Status: DC

## 2011-06-06 MED ORDER — TESTOSTERONE CYPIONATE 200 MG/ML IM SOLN
200.0000 mg | Freq: Once | INTRAMUSCULAR | Status: AC
  Administered 2011-06-06: 200 mg via INTRAMUSCULAR
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
## 2011-06-07 ENCOUNTER — Ambulatory Visit (HOSPITAL_COMMUNITY)
Admission: RE | Admit: 2011-06-07 | Discharge: 2011-06-09 | Disposition: A | Discharge status: Home / Self Care | Payer: BC Managed Care – PPO | Source: Ambulatory Visit | Attending: Orthopedic Surgery | Admitting: Orthopedic Surgery

## 2011-06-07 DIAGNOSIS — Z01812 Encounter for preprocedural laboratory examination: Secondary | ICD-10-CM | POA: Insufficient documentation

## 2011-06-07 DIAGNOSIS — I10 Essential (primary) hypertension: Secondary | ICD-10-CM | POA: Insufficient documentation

## 2011-06-07 DIAGNOSIS — G4733 Obstructive sleep apnea (adult) (pediatric): Secondary | ICD-10-CM | POA: Insufficient documentation

## 2011-06-07 DIAGNOSIS — K219 Gastro-esophageal reflux disease without esophagitis: Secondary | ICD-10-CM | POA: Insufficient documentation

## 2011-06-07 DIAGNOSIS — E119 Type 2 diabetes mellitus without complications: Secondary | ICD-10-CM | POA: Insufficient documentation

## 2011-06-07 DIAGNOSIS — M24476 Recurrent dislocation, unspecified foot: Secondary | ICD-10-CM | POA: Insufficient documentation

## 2011-06-07 DIAGNOSIS — M24473 Recurrent dislocation, unspecified ankle: Secondary | ICD-10-CM | POA: Insufficient documentation

## 2011-06-07 DIAGNOSIS — IMO0002 Reserved for concepts with insufficient information to code with codable children: Secondary | ICD-10-CM | POA: Insufficient documentation

## 2011-06-07 LAB — SURGICAL PCR SCREEN
MRSA, PCR: NEGATIVE
Staphylococcus aureus: NEGATIVE

## 2011-06-07 LAB — COMPREHENSIVE METABOLIC PANEL
ALT: 31 U/L (ref 0–53)
AST: 23 U/L (ref 0–37)
Albumin: 3.7 g/dL (ref 3.5–5.2)
Alkaline Phosphatase: 57 U/L (ref 39–117)
BUN: 16 mg/dL (ref 6–23)
CO2: 25 mEq/L (ref 19–32)
Calcium: 10.3 mg/dL (ref 8.4–10.5)
Chloride: 104 mEq/L (ref 96–112)
Creatinine, Ser: 0.87 mg/dL (ref 0.50–1.35)
GFR calc Af Amer: >90 mL/min (ref >90)
GFR calc non Af Amer: >90 mL/min (ref >90)
Glucose, Bld: 104 mg/dL — ABNORMAL HIGH (ref 70–99)
Potassium: 4.2 mEq/L (ref 3.5–5.1)
Sodium: 140 mEq/L (ref 135–145)
Total Bilirubin: 0.8 mg/dL (ref 0.3–1.2)
Total Protein: 7.5 g/dL (ref 6.0–8.3)

## 2011-06-07 LAB — APTT: aPTT: 33 seconds (ref 24–37)

## 2011-06-07 LAB — PROTIME-INR
INR: 1.07 (ref 0.00–1.49)
Prothrombin Time: 14.1 s (ref 11.6–15.2)

## 2011-06-07 LAB — CBC
HCT: 40.5 % (ref 39.0–52.0)
Hemoglobin: 13.9 g/dL (ref 13.0–17.0)
MCH: 29.6 pg (ref 26.0–34.0)
MCHC: 34.3 g/dL (ref 30.0–36.0)
MCV: 86.4 fL (ref 78.0–100.0)
Platelets: 412 10*3/uL — ABNORMAL HIGH (ref 150–400)
RBC: 4.69 MIL/uL (ref 4.22–5.81)
RDW: 12.8 % (ref 11.5–15.5)
WBC: 9.2 10*3/uL (ref 4.0–10.5)

## 2011-06-07 LAB — GLUCOSE, CAPILLARY: Glucose-Capillary: 120 mg/dL — ABNORMAL HIGH (ref 70–99)
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
## 2011-06-13 ENCOUNTER — Other Ambulatory Visit: Payer: Self-pay | Admitting: Orthopedic Surgery

## 2011-06-13 ENCOUNTER — Ambulatory Visit
Admission: RE | Admit: 2011-06-13 | Discharge: 2011-06-13 | Disposition: A | Discharge status: Home / Self Care | Payer: BC Managed Care – PPO | Source: Ambulatory Visit | Attending: Orthopedic Surgery | Admitting: Orthopedic Surgery

## 2011-06-13 DIAGNOSIS — S82899A Other fracture of unspecified lower leg, initial encounter for closed fracture: Secondary | ICD-10-CM

## 2011-06-14 ENCOUNTER — Telehealth: Payer: Self-pay | Admitting: Family Medicine

## 2011-06-14 LAB — LIPID PANEL
Cholesterol: 151 mg/dL (ref 0–200)
HDL: 30 mg/dL — ABNORMAL LOW (ref >39)
LDL Cholesterol: 96 mg/dL (ref 0–99)
Total CHOL/HDL Ratio: 5 Ratio
Triglycerides: 125 mg/dL (ref <150)
VLDL: 25 mg/dL (ref 0–40)

## 2011-06-14 NOTE — Telephone Encounter (Signed)
Angel called with [facility] and reported pt is still non wt-bearing and they cannot start PT at this point.  They can provide other services in the interim and needs official orders as to what direction the provider would like to go at this point. Plan:  Sent encounter to the provider to advise. Jarvis Newcomer, LPN [facility] Dimes

## 2011-06-14 NOTE — Telephone Encounter (Signed)
No further orders. We will call them again when he is able to do PT. He doesn't need any further asst at this time.

## 2011-06-17 NOTE — Telephone Encounter (Signed)
Jonathan Yu w/ [facility] informed of instructions. KJ LPN

## 2011-06-19 ENCOUNTER — Other Ambulatory Visit: Payer: Self-pay | Admitting: Orthopedic Surgery

## 2011-06-19 DIAGNOSIS — S82899A Other fracture of unspecified lower leg, initial encounter for closed fracture: Secondary | ICD-10-CM

## 2011-06-20 ENCOUNTER — Ambulatory Visit
Admission: RE | Admit: 2011-06-20 | Discharge: 2011-06-20 | Disposition: A | Discharge status: Home / Self Care | Payer: BC Managed Care – PPO | Source: Ambulatory Visit | Attending: Orthopedic Surgery | Admitting: Orthopedic Surgery

## 2011-06-20 DIAGNOSIS — S82899A Other fracture of unspecified lower leg, initial encounter for closed fracture: Secondary | ICD-10-CM

## 2011-06-25 ENCOUNTER — Ambulatory Visit: Payer: BC Managed Care – PPO

## 2011-06-27 ENCOUNTER — Ambulatory Visit (INDEPENDENT_AMBULATORY_CARE_PROVIDER_SITE_OTHER): Payer: BC Managed Care – PPO | Admitting: Family Medicine

## 2011-06-27 VITALS — BP 136/81 | HR 79 | Wt 230.0 lb

## 2011-06-27 DIAGNOSIS — E291 Testicular hypofunction: Secondary | ICD-10-CM

## 2011-06-27 MED ORDER — TESTOSTERONE CYPIONATE 200 MG/ML IM SOLN
200.0000 mg | Freq: Once | INTRAMUSCULAR | Status: AC
  Administered 2011-06-27: 200 mg via INTRAMUSCULAR

## 2011-06-27 MED ORDER — PHENTERMINE HCL 30 MG PO CAPS: 30.0000 mg | ORAL_CAPSULE | ORAL | Status: DC

## 2011-06-27 NOTE — Progress Notes (Signed)
  Subjective:    Patient ID: Jonathan Yu, male    DOB: 03-24-1951, 60 y.o.   MRN: [id number] Testosterone injection and needs refill on Phentermine HPI    Review of Systems     Objective:   Physical Exam        Assessment & Plan:  Weight loss looks great. At goal will refill the phentermine. BP is ok.

## 2011-07-29 ENCOUNTER — Ambulatory Visit: Payer: BC Managed Care – PPO | Admitting: Family Medicine

## 2011-08-01 ENCOUNTER — Other Ambulatory Visit: Payer: Self-pay | Admitting: *Deleted

## 2011-08-01 ENCOUNTER — Ambulatory Visit (INDEPENDENT_AMBULATORY_CARE_PROVIDER_SITE_OTHER): Payer: BC Managed Care – PPO | Admitting: Family Medicine

## 2011-08-01 VITALS — BP 144/87 | HR 81 | Wt 232.0 lb

## 2011-08-01 DIAGNOSIS — E669 Obesity, unspecified: Secondary | ICD-10-CM

## 2011-08-01 DIAGNOSIS — E291 Testicular hypofunction: Secondary | ICD-10-CM

## 2011-08-01 MED ORDER — PHENTERMINE HCL 30 MG PO CAPS: 30.0000 mg | ORAL_CAPSULE | ORAL | Status: DC

## 2011-08-01 MED ORDER — TESTOSTERONE CYPIONATE 200 MG/ML IM SOLN
200.0000 mg | Freq: Once | INTRAMUSCULAR | Status: AC
  Administered 2011-08-01: 200 mg via INTRAMUSCULAR

## 2011-08-01 NOTE — Progress Notes (Signed)
  Subjective:    Patient ID: Jonathan Yu, male    DOB: 1950/11/23, 60 y.o.   MRN: [id number] BP check and wt check. Needs phentermine refilled and sent to [facility]. Testosterone injection given HPI    Review of Systems     Objective:   Physical Exam        Assessment & Plan:  Obesity - Weight loss.  Weight is up 2 lbs. Will refill one more month and f/u in one month for weight check. If doesn't lose weight will not refill.

## 2011-08-08 ENCOUNTER — Ambulatory Visit (INDEPENDENT_AMBULATORY_CARE_PROVIDER_SITE_OTHER): Payer: BC Managed Care – PPO | Admitting: Family Medicine

## 2011-08-08 ENCOUNTER — Encounter: Payer: Self-pay | Admitting: Family Medicine

## 2011-08-08 DIAGNOSIS — J329 Chronic sinusitis, unspecified: Secondary | ICD-10-CM

## 2011-08-08 NOTE — Progress Notes (Signed)
  Subjective:    Patient ID: Jonathan Yu, male    DOB: 02-26-1951, 60 y.o.   MRN: [id number]  HPI + sick contact.  Sputum with dark color and blood in the nose.  Feels a little better today.  Has been taking vitamin C as well. Voice is raspy and throat is raspy. Tried some cough meds ands started zinc.  Mild sinus pressure. No HA. Not taken any meds for this.   Review of Systems     Objective:   Physical Exam  Constitutional: He is oriented to person, place, and time. He appears well-developed and well-nourished.  HENT:  Head: Normocephalic and atraumatic.  Right Ear: External ear normal.  Left Ear: External ear normal.  Nose: Nose normal.  Mouth/Throat: Oropharynx is clear and moist.       TMs and canals are clear.   Eyes: Conjunctivae and EOM are normal. Pupils are equal, round, and reactive to light.  Neck: Neck supple. No thyromegaly present.  Cardiovascular: Normal rate and normal heart sounds.   Pulmonary/Chest: Effort normal and breath sounds normal.  Lymphadenopathy:    He has no cervical adenopathy.  Neurological: He is alert and oriented to person, place, and time.  Skin: Skin is warm and dry.  Psychiatric: He has a normal mood and affect.          Assessment & Plan:  Sinusitis - Likely viral.  Call if not better in one week or if suddenly gets worse.  Can continue the zince for next 3-4 days.

## 2011-08-08 NOTE — Patient Instructions (Signed)
Call if not better in one week or if suddenly getting worse.

## 2011-08-21 ENCOUNTER — Other Ambulatory Visit: Payer: Self-pay | Admitting: Orthopedic Surgery

## 2011-08-21 DIAGNOSIS — T148XXA Other injury of unspecified body region, initial encounter: Secondary | ICD-10-CM

## 2011-08-22 ENCOUNTER — Ambulatory Visit
Admission: RE | Admit: 2011-08-22 | Discharge: 2011-08-22 | Disposition: A | Discharge status: Home / Self Care | Payer: BC Managed Care – PPO | Source: Ambulatory Visit | Attending: Orthopedic Surgery | Admitting: Orthopedic Surgery

## 2011-08-22 DIAGNOSIS — T148XXA Other injury of unspecified body region, initial encounter: Secondary | ICD-10-CM

## 2011-09-02 ENCOUNTER — Ambulatory Visit (INDEPENDENT_AMBULATORY_CARE_PROVIDER_SITE_OTHER): Payer: BC Managed Care – PPO | Admitting: Physician Assistant

## 2011-09-02 DIAGNOSIS — E291 Testicular hypofunction: Secondary | ICD-10-CM

## 2011-09-02 NOTE — Progress Notes (Signed)
Patient ID: Jonathan Yu, male   DOB: 1950/11/24, 61 y.o.   MRN: [id number] Testosterone inj given

## 2011-09-06 LAB — PSA: PSA: 1.24 ng/mL

## 2011-09-10 ENCOUNTER — Encounter: Payer: Self-pay | Admitting: Family Medicine

## 2011-09-10 ENCOUNTER — Other Ambulatory Visit: Payer: Self-pay | Admitting: *Deleted

## 2011-09-10 DIAGNOSIS — N400 Enlarged prostate without lower urinary tract symptoms: Secondary | ICD-10-CM | POA: Insufficient documentation

## 2011-09-10 DIAGNOSIS — N529 Male erectile dysfunction, unspecified: Secondary | ICD-10-CM | POA: Insufficient documentation

## 2011-09-10 MED ORDER — PHENTERMINE HCL 30 MG PO CAPS: 30.0000 mg | ORAL_CAPSULE | ORAL | Status: DC

## 2011-09-12 ENCOUNTER — Telehealth: Payer: Self-pay | Admitting: *Deleted

## 2011-09-12 DIAGNOSIS — E785 Hyperlipidemia, unspecified: Secondary | ICD-10-CM

## 2011-09-12 NOTE — Telephone Encounter (Signed)
Labs enetered

## 2011-09-13 LAB — TESTOSTERONE: Testosterone: 849.65 ng/dL (ref 250–890)

## 2011-09-13 LAB — LIPID PANEL
Cholesterol: 122 mg/dL (ref 0–200)
HDL: 43 mg/dL (ref >39)
LDL Cholesterol: 69 mg/dL (ref 0–99)
Total CHOL/HDL Ratio: 2.8 Ratio
Triglycerides: 50 mg/dL (ref <150)
VLDL: 10 mg/dL (ref 0–40)

## 2011-09-17 ENCOUNTER — Encounter: Payer: Self-pay | Admitting: *Deleted

## 2011-09-30 ENCOUNTER — Ambulatory Visit: Payer: BC Managed Care – PPO | Admitting: Family Medicine

## 2011-09-30 ENCOUNTER — Ambulatory Visit (INDEPENDENT_AMBULATORY_CARE_PROVIDER_SITE_OTHER): Payer: BC Managed Care – PPO | Admitting: Family Medicine

## 2011-09-30 DIAGNOSIS — R42 Dizziness and giddiness: Secondary | ICD-10-CM

## 2011-09-30 DIAGNOSIS — Z0130 Encounter for examination of blood pressure without abnormal findings: Secondary | ICD-10-CM

## 2011-09-30 DIAGNOSIS — Z136 Encounter for screening for cardiovascular disorders: Secondary | ICD-10-CM

## 2011-09-30 NOTE — Progress Notes (Signed)
Patient ID: Jonathan Yu, male   DOB: 12-Aug-1951, 61 y.o.   MRN: [id number] Pt came in for BP check bc he is having occasional dizziness and thought BP was running low. If dizziness continues Pt will make an apt.    BP is normal. Make sure staying hydrated. Can stop the phentermine if continue and make an anppt.

## 2011-09-30 NOTE — Progress Notes (Signed)
Pt informed

## 2011-10-09 ENCOUNTER — Other Ambulatory Visit: Payer: Self-pay | Admitting: *Deleted

## 2011-10-09 MED ORDER — PHENTERMINE HCL 30 MG PO CAPS: 30.0000 mg | ORAL_CAPSULE | ORAL | Status: DC

## 2011-10-15 ENCOUNTER — Ambulatory Visit: Payer: BC Managed Care – PPO | Admitting: Family Medicine

## 2011-11-07 ENCOUNTER — Telehealth: Payer: Self-pay | Admitting: *Deleted

## 2011-11-07 NOTE — Telephone Encounter (Signed)
Dr. Linford Arnold can review this- I came in this morning and this was in my result box- looks like scanned and sent from [facility]

## 2011-11-07 NOTE — Telephone Encounter (Signed)
Reviewed notes 

## 2011-12-18 ENCOUNTER — Ambulatory Visit (INDEPENDENT_AMBULATORY_CARE_PROVIDER_SITE_OTHER): Payer: BC Managed Care – PPO | Admitting: Physician Assistant

## 2011-12-18 ENCOUNTER — Encounter: Payer: Self-pay | Admitting: Physician Assistant

## 2011-12-18 VITALS — BP 130/78 | HR 77 | Temp 98.1°F | Ht 65.5 in | Wt 204.0 lb

## 2011-12-18 DIAGNOSIS — J329 Chronic sinusitis, unspecified: Secondary | ICD-10-CM

## 2011-12-18 DIAGNOSIS — M436 Torticollis: Secondary | ICD-10-CM

## 2011-12-18 MED ORDER — AMOXICILLIN-POT CLAVULANATE 875-125 MG PO TABS: 1.0000 | ORAL_TABLET | Freq: Two times a day (BID) | ORAL | Status: AC

## 2011-12-18 NOTE — Progress Notes (Signed)
  Subjective:    Patient ID: Jonathan Yu, male    DOB: 1950/11/28, 61 y.o.   MRN: [id number]  HPI Patient presents with 1 1/2 weeks of sinus pressure and headache. He has tried sinus rinses, Mucinex D, alka seltzer plus and nothing has worked. He feels like his has been punched in the face. He has felt hot a couple of times last week but has not taken his temperature. Denies n/v, SOB. He has had a mild sore throat and ear pain that is mostly right ear. He has also tried the vaprorizer to help with nasal congestion.   He also complains of neck stiffness that has been ongoing for 1 week. He has a history of neck pain and stiffness. He has not tried anything to make better. He denies any injury or sleeping on it wrong.     Review of Systems     Objective:   Physical Exam  Constitutional: He is oriented to person, place, and time. He appears well-developed and well-nourished.  HENT:  Head: Normocephalic and atraumatic.  Right Ear: External ear normal.  Left Ear: External ear normal.       TM's were starting to get dull at the base but ossicles were able to be visualized and light reflex present. Bilateral maxillary tenderness with palpation. Oropharynx red without exudate. Turbinates red and swollen.  Eyes: Conjunctivae are normal.  Neck: Normal range of motion. Neck supple. No thyromegaly present.       Bilateral ant. Cervical lymphnode enlargement with mild tenderness.  Cardiovascular: Normal rate, regular rhythm and normal heart sounds.   Pulmonary/Chest: Effort normal and breath sounds normal. He has no wheezes.  Musculoskeletal:       Neck has normal ROM. Strength 5/5. NO tenderness to palpation.  Lymphadenopathy:    He has cervical adenopathy.  Neurological: He is alert and oriented to person, place, and time.  Skin: Skin is warm and dry.  Psychiatric: He has a normal mood and affect. His behavior is normal.          Assessment & Plan:  Sinusitis- Continue with symptomatic  treatment for sinuses. Start with Augmentin twice a day for 10 days. Call if not improving by Friday.   Neck stiffness-Use Relafen, since already have prescription, for neck pain and stiffness this may also help with sinus headache pain.

## 2011-12-18 NOTE — Patient Instructions (Signed)
Continue with symptomatic treatment for sinuses. Start with Augmentin twice a day for 10 days. Call if not improving by Friday.   Use Relafen for neck pain and stiffness this may also help with sinus headache pain.   Sinusitis Sinuses are air pockets within the bones of your face. The growth of bacteria within a sinus leads to infection. The infection prevents the sinuses from draining. This infection is called sinusitis. SYMPTOMS  There will be different areas of pain depending on which sinuses have become infected.  The maxillary sinuses often produce pain beneath the eyes.   Frontal sinusitis may cause pain in the middle of the forehead and above the eyes.  Other problems (symptoms) include:  Toothaches.   Colored, pus-like (purulent) drainage from the nose.   Swelling, warmth, and tenderness over the sinus areas may be signs of infection.  TREATMENT  Sinusitis is most often determined by an exam.X-rays may be taken. If x-rays have been taken, make sure you obtain your results or find out how you are to obtain them. Your caregiver may give you medications (antibiotics). These are medications that will help kill the bacteria causing the infection. You may also be given a medication (decongestant) that helps to reduce sinus swelling.  HOME CARE INSTRUCTIONS   Only take over-the-counter or prescription medicines for pain, discomfort, or fever as directed by your caregiver.   Drink extra fluids. Fluids help thin the mucus so your sinuses can drain more easily.   Applying either moist heat or ice packs to the sinus areas may help relieve discomfort.   Use saline nasal sprays to help moisten your sinuses. The sprays can be found at your local drugstore.  SEEK IMMEDIATE MEDICAL CARE IF:  You have a fever.   You have increasing pain, severe headaches, or toothache.   You have nausea, vomiting, or drowsiness.   You develop unusual swelling around the face or trouble seeing.  MAKE  SURE YOU:   Understand these instructions.   Will watch your condition.   Will get help right away if you are not doing well or get worse.  Document Released: 07/29/2005 Document Revised: 07/18/2011 Document Reviewed: 02/25/2007 [facility] Patient Information 2012 [place], [place].

## 2012-07-16 ENCOUNTER — Ambulatory Visit (INDEPENDENT_AMBULATORY_CARE_PROVIDER_SITE_OTHER): Payer: BC Managed Care – PPO | Admitting: Family Medicine

## 2012-07-16 ENCOUNTER — Encounter: Payer: Self-pay | Admitting: Family Medicine

## 2012-07-16 VITALS — BP 122/74 | HR 74 | Ht 65.5 in | Wt 230.0 lb

## 2012-07-16 DIAGNOSIS — Z298 Encounter for other specified prophylactic measures: Secondary | ICD-10-CM

## 2012-07-16 DIAGNOSIS — Z23 Encounter for immunization: Secondary | ICD-10-CM

## 2012-07-16 DIAGNOSIS — E291 Testicular hypofunction: Secondary | ICD-10-CM

## 2012-07-16 DIAGNOSIS — Z13220 Encounter for screening for lipoid disorders: Secondary | ICD-10-CM

## 2012-07-16 DIAGNOSIS — Z125 Encounter for screening for malignant neoplasm of prostate: Secondary | ICD-10-CM

## 2012-07-16 DIAGNOSIS — Z0000 Encounter for general adult medical examination without abnormal findings: Secondary | ICD-10-CM

## 2012-07-16 DIAGNOSIS — E119 Type 2 diabetes mellitus without complications: Secondary | ICD-10-CM

## 2012-07-16 DIAGNOSIS — E669 Obesity, unspecified: Secondary | ICD-10-CM

## 2012-07-16 LAB — HEMOGLOBIN A1C
Hgb A1c MFr Bld: 5.4 % (ref <5.7)
Mean Plasma Glucose: 108 mg/dL (ref <117)

## 2012-07-16 LAB — LIPID PANEL
Cholesterol: 192 mg/dL (ref 0–200)
HDL: 44 mg/dL (ref >39)
LDL Cholesterol: 122 mg/dL — ABNORMAL HIGH (ref 0–99)
Total CHOL/HDL Ratio: 4.4 Ratio
Triglycerides: 131 mg/dL (ref <150)
VLDL: 26 mg/dL (ref 0–40)

## 2012-07-16 LAB — BASIC METABOLIC PANEL WITH GFR
BUN: 19 mg/dL (ref 6–23)
CO2: 28 mEq/L (ref 19–32)
Calcium: 9.7 mg/dL (ref 8.4–10.5)
Chloride: 102 mEq/L (ref 96–112)
Creat: 0.9 mg/dL (ref 0.50–1.35)
GFR, Est African American: >89 mL/min
GFR, Est Non African American: >89 mL/min
Glucose, Bld: 94 mg/dL (ref 70–99)
Potassium: 4.6 mEq/L (ref 3.5–5.3)
Sodium: 138 mEq/L (ref 135–145)

## 2012-07-16 MED ORDER — PHENTERMINE HCL 30 MG PO CAPS: 30.0000 mg | ORAL_CAPSULE | ORAL | Status: DC

## 2012-07-16 NOTE — Patient Instructions (Signed)
Dr. Navin Dogan's General Advice Following Your Complete Physical Exam  The Benefits of Regular Exercise: Unless you suffer from an uncontrolled cardiovascular condition, studies strongly suggest that regular exercise and physical activity will add to both the quality and length of your life.  The World Health Organization recommends 150 minutes of moderate intensity aerobic activity every week.  This is best split over 3-4 days a week, and can be as simple as a brisk walk for just over 35 minutes "most days of the week".  This type of exercise has been shown to lower LDL-Cholesterol, lower average blood sugars, lower blood pressure, lower cardiovascular disease risk, improve memory, and increase one's overall sense of wellbeing.  The addition of anaerobic (or "strength training") exercises offers additional benefits including but not limited to increased metabolism, prevention of osteoporosis, and improved overall cholesterol levels.  How Can I Strive For A Low-Fat Diet?: Current guidelines recommend that 25-35 percent of your daily energy (food) intake should come from fats.  One might ask how can this be achieved without having to dissect each meal on a daily basis?  Switch to skim or 1% milk instead of whole milk.  Focus on lean meats such as ground turkey, fresh fish, baked chicken, and lean cuts of beef as your source of dietary protein.  Consume less than 300mg/day of dietary cholesterol.  Limit trans fatty acid consumption primarily by limiting synthetic trans fats such as partially hydrogenated oils (Ex: fried fast foods).  Focus efforts on reducing your intake of "solid" fats (Ex: Butter).  Substitute olive or vegetable oil for solid fats where possible.  Moderation of Salt Intake: Provided you don't carry a diagnosis of congestive heart failure nor renal failure, I recommend a daily allowance of no more than 2300 mg of salt (sodium).  Keeping under this daily goal is associated with a  decreased risk of cardiovascular events, creeping above it can lead to elevated blood pressures and increases your risk of cardiovascular events.  Milligrams (mg) of salt is listed on all nutrition labels, and your daily intake can add up faster than you think.  Most canned and frozen dinners can pack in over half your daily salt allowance in one meal.    Lifestyle Health Risks: Certain lifestyle choices carry specific health risks.  As you may already know, tobacco use has been associated with increasing one's risk of cardiovascular disease, pulmonary disease, numerous cancers, among many other issues.  What you may not know is that there are medications and nicotine replacement strategies that can more than double your chances of successfully quitting.  I would be thrilled to help manage your quitting strategy if you currently use tobacco products.  When it comes to alcohol use, I've yet to find an "ideal" daily allowance.  Provided an individual does not have a medical condition that is exacerbated by alcohol consumption, general guidelines determine "safe drinking" as no more than two standard drinks for a man or no more than one standard drink for a male per day.  However, much debate still exists on whether any amount of alcohol consumption is technically "safe".  My general advice, keep alcohol consumption to a minimum for general health promotion.  If you or others believe that alcohol, tobacco, or recreational drug use is interfering with your life, I would be happy to provide confidential counseling regarding treatment options.  General "Over The Counter" Nutrition Advice: Postmenopausal women should aim for a daily calcium intake of 1200 mg, however a significant   portion of this might already be provided by diets including milk, yogurt, cheese, and other dairy products.  Vitamin D has been shown to help preserve bone density, prevent fatigue, and has even been shown to help reduce falls in the  elderly.  Ensuring a daily intake of 800 Units of Vitamin D is a good place to start to enjoy the above benefits, we can easily check your Vitamin D level to see if you'd potentially benefit from supplementation beyond 800 Units a day.  Folic Acid intake should be of particular concern to women of childbearing age.  Daily consumption of 400-800 mcg of Folic Acid is recommended to minimize the chance of spinal cord defects in a fetus should pregnancy occur.    For many adults, accidents still remain one of the most common culprits when it comes to cause of death.  Some of the simplest but most effective preventitive habits you can adopt include regular seatbelt use, proper helmet use, securing firearms, and regularly testing your smoke and carbon monoxide detectors.  Milani Lowenstein B. Chaniyah Jahr DO [facility] [address], [address] [place],  [address] Phone: [phone]  

## 2012-07-16 NOTE — Progress Notes (Signed)
CC: Jonathan Yu is a 62 y.o. male is here for Annual Exam, wants to restart phentermine and flu and pneumonia vaccine  Colonoscopy: Colonoscopy Spring 2012 cleared for 5 years, no recent rectal bleeding or tarry-like stools Prostate:  1/13 1.24, Had a "good" PSA with his Urologist two months ago, believes it was less than 4.0  DEXA: Not indicated no risk factors for osteoporosis  Influenza Vaccine: He will receive this today Pneumovax: 06/2009 Td/Tdap: 04/2011 Zoster: Not indicated a however he will be turning 62 in 3 months  Subjective: HPI:  74 with PMHx of HTN, OSA, Type 2 DM, BPH, Obesity, HLD, History low T.  no major complaints however he would like to restart phentermine, he's been off of this for almost a year now, while on it he lost 30-40 pounds however has regained it over the past 2-3 months, he treats this to stress with his sons overcoming addiction issues. He also mentioned he's been a little bit more moody and emotional lately, the symptoms were also present when his testosterone was low but resolves when replacement began. He is no longer taking testosterone and wonders if we can check his testosterone today.  He's exercising most days of the week, cardio and strength training area he tries to watch what he eats sticking to a low-fat diet however he does have late night snacking issues.  There's no alcohol use, tobacco use, nor street drug use.  Over the past 2 weeks have you been bothered by: - Little interest or pleasure in doing things: no - Feeling down depressed or hopeless: no  He is no longer taking a daily aspirin as it was causing some lightheadedness.  Review of Systems - General ROS: negative for - chills, fever, night sweats, weight gain or weight loss Ophthalmic ROS: negative for - decreased vision Psychological ROS: negative for - anxiety or depression ENT ROS: negative for - hearing change, nasal congestion, tinnitus or allergies Hematological and  Lymphatic ROS: negative for - bleeding problems, bruising or swollen lymph nodes Breast ROS: negative Respiratory ROS: no cough, shortness of breath, or wheezing Cardiovascular ROS: no chest pain or dyspnea on exertion Gastrointestinal ROS: no abdominal pain, change in bowel habits, or black or bloody stools Genito-Urinary ROS: negative for - genital discharge, genital ulcers, incontinence or abnormal bleeding from genitals Musculoskeletal ROS: negative for - joint pain or muscle pain Neurological ROS: negative for - headaches or memory loss Dermatological ROS: negative for lumps, mole changes, rash and skin lesion changes  Past Medical History  Diagnosis Date  . Allergy   . Hyperlipidemia   . Hypertension   . Diabetes mellitus   . DDD (degenerative disc disease), lumbar   . OSA on CPAP   . Type II or unspecified type diabetes mellitus without mention of complication, not stated as uncontrolled 01/24/2011    Pneumovax given 06-2009      Family History  Problem Relation Age of Onset  . Cancer Mother 70    Lung     History  Substance Use Topics  . Smoking status: Current Some Day Smoker    Types: Pipe, Cigars  . Smokeless tobacco: Not on file  . Alcohol Use: Yes     Objective: Filed Vitals:   07/16/12 0858  BP: 122/74  Pulse: 74    General: No Acute Distress HEENT: Atraumatic, normocephalic, conjunctivae normal without scleral icterus.  No nasal discharge, hearing grossly intact, TMs with good landmarks bilaterally with no middle ear abnormalities, posterior pharynx  clear without oral lesions. Neck: Supple, trachea midline, no cervical nor supraclavicular adenopathy. Pulmonary: Clear to auscultation bilaterally without wheezing, rhonchi, nor rales. Cardiac: Regular rate and rhythm.  No murmurs, rubs, nor gallops. No peripheral edema.  2+ peripheral pulses bilaterally. Abdomen: Bowel sounds normal.  No masses.  Non-tender without rebound.  Negative Murphy's sign. GU:  Penis without lesions.  Bilateral descended non-tender testicles without palpable abnormal masses. MSK: Grossly intact, no signs of weakness.  Full strength throughout upper and lower extremities.  Full ROM in upper and lower extremities.  No midline spinal tenderness. Neuro: Gait unremarkable, CN II-XII grossly intact.  C5-C6 Reflex 2/4 Bilaterally, L4 Reflex 2/4 Bilaterally.  Cerebellar function intact. Skin: No rashes. Psych: Alert and oriented to person/place/time.  Thought process normal. No anxiety/depression.  Assessment & Plan: Jonathan Yu was seen today for annual exam, wants to restart phentermine and flu and pneumonia vaccine.  Diagnoses and associated orders for this visit:  Type 2 diabetes mellitus - HgB A1c - BASIC METABOLIC PANEL WITH GFR - Microalbumin / creatinine urine ratio  Need for lipid screening - Lipid panel  Prostate cancer screening  Annual physical exam - Lipid panel  Need for prophylactic immunotherapy - Flu vaccine greater than or equal to 3yo with preservative IM  Hypogonadism male - Testosterone, free, total  Obesity - phentermine 30 MG capsule; Take 1 capsule (30 mg total) by mouth every morning.    Depression screen negative, no need for immediate followup however will readdress annually or sooner if needed.  He has not had A1c in over 3 months, history of diabetes therefore we'll check this in addition to basic metabolic panel and microalbumin creatinine ratio. Lipid screen as above no need for PSA as this was done relatively recent for his report. He is up-to-date on immunizations other than an flu shot which will receive today. We'll check testosterone since its morning per patient's request. Restart phentermine he'll return in 30 days for a blood pressure, pulse, weight check.  Discuss health maintenance topics to promote general health and well-being, handout was given to reinforce use topics. I've asked him to take a baby aspirin daily, if  not tolerated please try every other day. Health maintenance topics updated above history of present illness  Return in about 4 weeks (around 08/13/2012) for BP and weight check.

## 2012-07-17 ENCOUNTER — Encounter: Payer: Self-pay | Admitting: Family Medicine

## 2012-07-17 LAB — TESTOSTERONE, FREE, TOTAL, SHBG
Sex Hormone Binding: 32 nmol/L (ref 13–71)
Testosterone, Free: 51.2 pg/mL (ref 47.0–244.0)
Testosterone-% Free: 2 % (ref 1.6–2.9)
Testosterone: 259.33 ng/dL — ABNORMAL LOW (ref 300–890)

## 2012-07-17 LAB — MICROALBUMIN / CREATININE URINE RATIO
Creatinine, Urine: 158.7 mg/dL
Microalb Creat Ratio: 5.9 mg/g (ref 0.0–30.0)
Microalb, Ur: 0.93 mg/dL (ref 0.00–1.89)

## 2012-08-03 ENCOUNTER — Ambulatory Visit: Payer: BC Managed Care – PPO

## 2012-08-14 ENCOUNTER — Encounter: Payer: Self-pay | Admitting: Family Medicine

## 2012-08-14 ENCOUNTER — Ambulatory Visit (INDEPENDENT_AMBULATORY_CARE_PROVIDER_SITE_OTHER): Payer: BC Managed Care – PPO | Admitting: Family Medicine

## 2012-08-14 VITALS — BP 117/74 | HR 88

## 2012-08-14 DIAGNOSIS — R635 Abnormal weight gain: Secondary | ICD-10-CM

## 2012-08-14 DIAGNOSIS — E291 Testicular hypofunction: Secondary | ICD-10-CM

## 2012-08-14 DIAGNOSIS — E669 Obesity, unspecified: Secondary | ICD-10-CM

## 2012-08-14 MED ORDER — TESTOSTERONE CYPIONATE 200 MG/ML IM SOLN
200.0000 mg | Freq: Once | INTRAMUSCULAR | Status: AC
  Administered 2012-08-14: 200 mg via INTRAMUSCULAR

## 2012-08-14 MED ORDER — PHENTERMINE HCL 30 MG PO CAPS: 30.0000 mg | ORAL_CAPSULE | ORAL | Status: DC

## 2012-08-14 NOTE — Progress Notes (Signed)
  Subjective:    Patient ID: Jonathan Yu, male    DOB: February 07, 1951, 62 y.o.   MRN: [id number]  HPI   Patient doing well on appetite suppressant.  Here for nurse visit, weight, BP, HR check.  Denies problems with insomnia, heart palpitations or tremors. Patient has gained some weight. Here to restart  testosterone injections. Review of Systems     Objective:   Physical Exam        Assessment & Plan:  Per Dr. Linford Arnold pt will come to get an injection once monthly per his request. Pt will schedule a f/u with her when the third injection is due. At that time will recheck labs. Pt aware that his phentermine will not be refilled at next BP and wt check if he has not lost weight. Pt does not want to start a cholesterol pill at this time and will do diet and exercise.  Agree with above.  Nani Gasser, MD

## 2012-09-22 ENCOUNTER — Telehealth: Payer: Self-pay | Admitting: *Deleted

## 2012-09-22 ENCOUNTER — Ambulatory Visit (INDEPENDENT_AMBULATORY_CARE_PROVIDER_SITE_OTHER): Payer: BC Managed Care – PPO | Admitting: Family Medicine

## 2012-09-22 VITALS — BP 129/78 | HR 66 | Wt 233.0 lb

## 2012-09-22 DIAGNOSIS — E291 Testicular hypofunction: Secondary | ICD-10-CM

## 2012-09-22 MED ORDER — TESTOSTERONE CYPIONATE 200 MG/ML IM SOLN
200.0000 mg | Freq: Once | INTRAMUSCULAR | Status: AC
  Administered 2012-09-22: 200 mg via INTRAMUSCULAR

## 2012-09-22 NOTE — Progress Notes (Signed)
  Subjective:    Patient ID: Jonathan Yu, male    DOB: [birth year], 62 y.o.   MRN: [id number] Testosterone injection 200mg  HPI    Review of Systems     Objective:   Physical Exam        Assessment & Plan:

## 2012-09-22 NOTE — Telephone Encounter (Signed)
Decline. His weight has gone up th last 2 times recorded.

## 2012-09-22 NOTE — Telephone Encounter (Signed)
Pt came in for testosterone injection & a weight check for phentermine.  I looked in his chart & there was not a weight entered in on his last visit.  His weight today was 233.  Would you like me to refill? Please advise

## 2012-09-22 NOTE — Telephone Encounter (Signed)
LMOM

## 2012-09-23 ENCOUNTER — Telehealth: Payer: Self-pay | Admitting: *Deleted

## 2012-09-23 NOTE — Telephone Encounter (Signed)
Ok to fill for one more month

## 2012-09-23 NOTE — Telephone Encounter (Signed)
Pt notified that his phentermine was declined. Pt states his weight was 235 his last visit.  This wasn't documented though.  Please advise.

## 2012-09-25 ENCOUNTER — Other Ambulatory Visit: Payer: Self-pay | Admitting: *Deleted

## 2012-09-25 DIAGNOSIS — E669 Obesity, unspecified: Secondary | ICD-10-CM

## 2012-09-25 MED ORDER — PHENTERMINE HCL 30 MG PO CAPS: 30.0000 mg | ORAL_CAPSULE | ORAL | Status: DC

## 2013-02-18 ENCOUNTER — Other Ambulatory Visit: Payer: Self-pay

## 2013-05-19 ENCOUNTER — Other Ambulatory Visit (HOSPITAL_COMMUNITY): Payer: Self-pay | Admitting: Orthopedic Surgery

## 2013-06-09 ENCOUNTER — Inpatient Hospital Stay (HOSPITAL_COMMUNITY): Admission: RE | Admit: 2013-06-09 | Payer: 59 | Source: Ambulatory Visit

## 2013-08-06 ENCOUNTER — Encounter (HOSPITAL_COMMUNITY): Payer: Self-pay | Admitting: Pharmacy Technician

## 2013-08-13 ENCOUNTER — Ambulatory Visit (HOSPITAL_COMMUNITY)
Admission: RE | Admit: 2013-08-13 | Discharge: 2013-08-13 | Disposition: A | Discharge status: Home / Self Care | Payer: 59 | Source: Ambulatory Visit | Attending: Orthopedic Surgery | Admitting: Orthopedic Surgery

## 2013-08-13 ENCOUNTER — Encounter (HOSPITAL_COMMUNITY): Payer: Self-pay

## 2013-08-13 ENCOUNTER — Encounter (HOSPITAL_COMMUNITY)
Admission: RE | Admit: 2013-08-13 | Discharge: 2013-08-13 | Disposition: A | Discharge status: Home / Self Care | Payer: 59 | Source: Ambulatory Visit | Attending: Orthopedic Surgery | Admitting: Orthopedic Surgery

## 2013-08-13 DIAGNOSIS — M12579 Traumatic arthropathy, unspecified ankle and foot: Secondary | ICD-10-CM | POA: Insufficient documentation

## 2013-08-13 DIAGNOSIS — Z01812 Encounter for preprocedural laboratory examination: Secondary | ICD-10-CM | POA: Insufficient documentation

## 2013-08-13 DIAGNOSIS — M47814 Spondylosis without myelopathy or radiculopathy, thoracic region: Secondary | ICD-10-CM | POA: Insufficient documentation

## 2013-08-13 DIAGNOSIS — Z01818 Encounter for other preprocedural examination: Secondary | ICD-10-CM | POA: Insufficient documentation

## 2013-08-13 DIAGNOSIS — Z01810 Encounter for preprocedural cardiovascular examination: Secondary | ICD-10-CM | POA: Insufficient documentation

## 2013-08-13 LAB — COMPREHENSIVE METABOLIC PANEL
ALT: 38 U/L (ref 0–53)
AST: 25 U/L (ref 0–37)
Albumin: 3.7 g/dL (ref 3.5–5.2)
Alkaline Phosphatase: 54 U/L (ref 39–117)
BUN: 20 mg/dL (ref 6–23)
CO2: 28 mEq/L (ref 19–32)
Calcium: 9.2 mg/dL (ref 8.4–10.5)
Chloride: 103 mEq/L (ref 96–112)
Creatinine, Ser: 0.79 mg/dL (ref 0.50–1.35)
GFR calc Af Amer: >90 mL/min (ref >90)
GFR calc non Af Amer: >90 mL/min (ref >90)
Glucose, Bld: 96 mg/dL (ref 70–99)
Potassium: 4.4 mEq/L (ref 3.7–5.3)
Sodium: 143 mEq/L (ref 137–147)
Total Bilirubin: 1 mg/dL (ref 0.3–1.2)
Total Protein: 7.1 g/dL (ref 6.0–8.3)

## 2013-08-13 LAB — APTT: aPTT: 28 seconds (ref 24–37)

## 2013-08-13 LAB — CBC
HCT: 41.2 % (ref 39.0–52.0)
Hemoglobin: 14.5 g/dL (ref 13.0–17.0)
MCH: 30.7 pg (ref 26.0–34.0)
MCHC: 35.2 g/dL (ref 30.0–36.0)
MCV: 87.1 fL (ref 78.0–100.0)
Platelets: 188 10*3/uL (ref 150–400)
RBC: 4.73 MIL/uL (ref 4.22–5.81)
RDW: 12.6 % (ref 11.5–15.5)
WBC: 6.6 10*3/uL (ref 4.0–10.5)

## 2013-08-13 LAB — PROTIME-INR
INR: 0.96 (ref 0.00–1.49)
Prothrombin Time: 12.6 seconds (ref 11.6–15.2)

## 2013-08-13 NOTE — Pre-Procedure Instructions (Signed)
Jonathan Yu  08/13/2013   Your procedure is scheduled on:  08/18/13  Report to [place]  2 * 3 at 630 AM.  Call this number if you have problems the morning of surgery: [phone]   Remember:   Do not eat food or drink liquids after midnight.   Take these medicines the morning of surgery with A SIP OF WATER: none   Do not wear jewelry, make-up or nail polish.  Do not wear lotions, powders, or perfumes. You may wear deodorant.  Do not shave 48 hours prior to surgery. Men may shave face and neck.  Do not bring valuables to the hospital.  [facility] is not responsible                  for any belongings or valuables.               Contacts, dentures or bridgework may not be worn into surgery.  Leave suitcase in the car. After surgery it may be brought to your room.  For patients admitted to the hospital, discharge time is determined by your                treatment team.               Patients discharged the day of surgery will not be allowed to drive  home.  Name and phone number of your driver:   Special Instructions: Incentive Spirometry - Practice and bring it with you on the day of surgery.   Please read over the following fact sheets that you were given: Pain Booklet, Coughing and Deep Breathing and Surgical Site Infection Prevention

## 2013-08-17 MED ORDER — CEFAZOLIN SODIUM-DEXTROSE 2-3 GM-% IV SOLR
2.0000 g | INTRAVENOUS | Status: AC
  Administered 2013-08-18: 2 g via INTRAVENOUS
  Filled 2013-08-17: qty 50

## 2013-08-18 ENCOUNTER — Encounter (HOSPITAL_COMMUNITY)
Admission: RE | Disposition: A | Discharge status: Home / Self Care | Payer: Self-pay | Source: Ambulatory Visit | Attending: Orthopedic Surgery

## 2013-08-18 ENCOUNTER — Inpatient Hospital Stay (HOSPITAL_COMMUNITY)
Admission: RE | Admit: 2013-08-18 | Discharge: 2013-08-21 | DRG: 470 | Disposition: A | Discharge status: Home / Self Care | Payer: 59 | Source: Ambulatory Visit | Attending: Orthopedic Surgery | Admitting: Orthopedic Surgery

## 2013-08-18 ENCOUNTER — Encounter (HOSPITAL_COMMUNITY): Payer: 59 | Admitting: Anesthesiology

## 2013-08-18 ENCOUNTER — Inpatient Hospital Stay (HOSPITAL_COMMUNITY): Payer: 59 | Admitting: Anesthesiology

## 2013-08-18 ENCOUNTER — Inpatient Hospital Stay (HOSPITAL_COMMUNITY): Payer: 59

## 2013-08-18 ENCOUNTER — Encounter (HOSPITAL_COMMUNITY): Payer: Self-pay | Admitting: *Deleted

## 2013-08-18 DIAGNOSIS — M12579 Traumatic arthropathy, unspecified ankle and foot: Principal | ICD-10-CM | POA: Diagnosis present

## 2013-08-18 DIAGNOSIS — F17200 Nicotine dependence, unspecified, uncomplicated: Secondary | ICD-10-CM | POA: Diagnosis present

## 2013-08-18 DIAGNOSIS — Z96661 Presence of right artificial ankle joint: Secondary | ICD-10-CM

## 2013-08-18 DIAGNOSIS — G4733 Obstructive sleep apnea (adult) (pediatric): Secondary | ICD-10-CM | POA: Diagnosis present

## 2013-08-18 DIAGNOSIS — Z6841 Body Mass Index (BMI) 40.0 and over, adult: Secondary | ICD-10-CM

## 2013-08-18 DIAGNOSIS — I10 Essential (primary) hypertension: Secondary | ICD-10-CM | POA: Diagnosis present

## 2013-08-18 DIAGNOSIS — E785 Hyperlipidemia, unspecified: Secondary | ICD-10-CM | POA: Diagnosis present

## 2013-08-18 DIAGNOSIS — Z472 Encounter for removal of internal fixation device: Secondary | ICD-10-CM

## 2013-08-18 DIAGNOSIS — E119 Type 2 diabetes mellitus without complications: Secondary | ICD-10-CM | POA: Diagnosis present

## 2013-08-18 HISTORY — PX: TOTAL ANKLE ARTHROPLASTY: SHX811

## 2013-08-18 LAB — GLUCOSE, CAPILLARY
Glucose-Capillary: 100 mg/dL — ABNORMAL HIGH (ref 70–99)
Glucose-Capillary: 142 mg/dL — ABNORMAL HIGH (ref 70–99)

## 2013-08-18 SURGERY — ARTHROPLASTY, ANKLE, TOTAL
Anesthesia: General | Site: Ankle | Laterality: Right

## 2013-08-18 MED ORDER — METOCLOPRAMIDE HCL 10 MG PO TABS: 5.0000 mg | ORAL_TABLET | Freq: Three times a day (TID) | ORAL | Status: DC | PRN

## 2013-08-18 MED ORDER — PHENTERMINE HCL 30 MG PO CAPS: 30.0000 mg | ORAL_CAPSULE | ORAL | Status: DC

## 2013-08-18 MED ORDER — MIDAZOLAM HCL 5 MG/5ML IJ SOLN
INTRAMUSCULAR | Status: DC | PRN
  Administered 2013-08-18: 2 mg via INTRAVENOUS

## 2013-08-18 MED ORDER — LACTATED RINGERS IV SOLN
INTRAVENOUS | Status: DC | PRN
  Administered 2013-08-18 (×2): via INTRAVENOUS

## 2013-08-18 MED ORDER — WARFARIN - PHARMACIST DOSING INPATIENT: Freq: Every day | Status: DC

## 2013-08-18 MED ORDER — WARFARIN VIDEO: Freq: Once | Status: DC

## 2013-08-18 MED ORDER — DEXTROSE 5 % IV SOLN
500.0000 mg | Freq: Four times a day (QID) | INTRAVENOUS | Status: DC | PRN
Start: 2013-08-18 — End: 2013-08-21
  Filled 2013-08-18: qty 5

## 2013-08-18 MED ORDER — OXYCODONE-ACETAMINOPHEN 5-325 MG PO TABS
1.0000 | ORAL_TABLET | ORAL | Status: DC | PRN
  Administered 2013-08-18 (×2): 1 via ORAL
  Administered 2013-08-19 (×3): 2 via ORAL
  Filled 2013-08-18: qty 1
  Filled 2013-08-18: qty 2
  Filled 2013-08-18: qty 1
  Filled 2013-08-18 (×2): qty 2

## 2013-08-18 MED ORDER — CEFAZOLIN SODIUM 1-5 GM-% IV SOLN
1.0000 g | Freq: Four times a day (QID) | INTRAVENOUS | Status: AC
  Administered 2013-08-18 – 2013-08-19 (×3): 1 g via INTRAVENOUS
  Filled 2013-08-18 (×4): qty 50

## 2013-08-18 MED ORDER — WARFARIN SODIUM 7.5 MG PO TABS
7.5000 mg | ORAL_TABLET | Freq: Once | ORAL | Status: AC
  Administered 2013-08-18: 7.5 mg via ORAL
  Filled 2013-08-18: qty 1

## 2013-08-18 MED ORDER — SODIUM CHLORIDE 0.9 % IV SOLN
INTRAVENOUS | Status: DC
  Administered 2013-08-18: 20 mL/h via INTRAVENOUS

## 2013-08-18 MED ORDER — PROMETHAZINE HCL 25 MG/ML IJ SOLN: 6.2500 mg | INTRAMUSCULAR | Status: DC | PRN

## 2013-08-18 MED ORDER — HYDROMORPHONE HCL PF 1 MG/ML IJ SOLN
0.5000 mg | INTRAMUSCULAR | Status: DC | PRN
  Administered 2013-08-18 – 2013-08-20 (×11): 1 mg via INTRAVENOUS
  Filled 2013-08-18 (×11): qty 1

## 2013-08-18 MED ORDER — LIDOCAINE HCL (CARDIAC) 20 MG/ML IV SOLN
INTRAVENOUS | Status: DC | PRN
  Administered 2013-08-18: 100 mg via INTRAVENOUS

## 2013-08-18 MED ORDER — METHOCARBAMOL 500 MG PO TABS
500.0000 mg | ORAL_TABLET | Freq: Four times a day (QID) | ORAL | Status: DC | PRN
  Administered 2013-08-18 – 2013-08-21 (×7): 500 mg via ORAL
  Filled 2013-08-18 (×7): qty 1

## 2013-08-18 MED ORDER — HYDROMORPHONE HCL PF 1 MG/ML IJ SOLN
INTRAMUSCULAR | Status: AC
  Administered 2013-08-18: 0.5 mg via INTRAVENOUS
  Filled 2013-08-18: qty 1

## 2013-08-18 MED ORDER — GLYCOPYRROLATE 0.2 MG/ML IJ SOLN
INTRAMUSCULAR | Status: DC | PRN
  Administered 2013-08-18: 0.6 mg via INTRAVENOUS

## 2013-08-18 MED ORDER — METOCLOPRAMIDE HCL 5 MG/ML IJ SOLN: 5.0000 mg | Freq: Three times a day (TID) | INTRAMUSCULAR | Status: DC | PRN

## 2013-08-18 MED ORDER — OXYCODONE HCL 5 MG PO TABS: 5.0000 mg | ORAL_TABLET | Freq: Once | ORAL | Status: DC | PRN

## 2013-08-18 MED ORDER — FENTANYL CITRATE 0.05 MG/ML IJ SOLN
INTRAMUSCULAR | Status: DC | PRN
  Administered 2013-08-18 (×2): 50 ug via INTRAVENOUS
  Administered 2013-08-18: 100 ug via INTRAVENOUS
  Administered 2013-08-18: 50 ug via INTRAVENOUS
  Administered 2013-08-18: 100 ug via INTRAVENOUS
  Administered 2013-08-18 (×3): 50 ug via INTRAVENOUS

## 2013-08-18 MED ORDER — ARTIFICIAL TEARS OP OINT
TOPICAL_OINTMENT | OPHTHALMIC | Status: DC | PRN
  Administered 2013-08-18: 1 via OPHTHALMIC

## 2013-08-18 MED ORDER — HYDROMORPHONE HCL PF 1 MG/ML IJ SOLN
0.2500 mg | INTRAMUSCULAR | Status: DC | PRN
  Administered 2013-08-18 (×3): 0.5 mg via INTRAVENOUS

## 2013-08-18 MED ORDER — ONDANSETRON HCL 4 MG PO TABS: 4.0000 mg | ORAL_TABLET | Freq: Four times a day (QID) | ORAL | Status: DC | PRN

## 2013-08-18 MED ORDER — 0.9 % SODIUM CHLORIDE (POUR BTL) OPTIME
TOPICAL | Status: DC | PRN
  Administered 2013-08-18: 1000 mL

## 2013-08-18 MED ORDER — ONDANSETRON HCL 4 MG/2ML IJ SOLN
INTRAMUSCULAR | Status: DC | PRN
  Administered 2013-08-18: 4 mg via INTRAVENOUS

## 2013-08-18 MED ORDER — HYDROMORPHONE HCL PF 1 MG/ML IJ SOLN
INTRAMUSCULAR | Status: AC
  Administered 2013-08-18: 1 mg
  Filled 2013-08-18: qty 1

## 2013-08-18 MED ORDER — ROCURONIUM BROMIDE 100 MG/10ML IV SOLN
INTRAVENOUS | Status: DC | PRN
  Administered 2013-08-18: 10 mg via INTRAVENOUS
  Administered 2013-08-18: 20 mg via INTRAVENOUS
  Administered 2013-08-18: 50 mg via INTRAVENOUS
  Administered 2013-08-18: 10 mg via INTRAVENOUS

## 2013-08-18 MED ORDER — ONDANSETRON HCL 4 MG/2ML IJ SOLN: 4.0000 mg | Freq: Four times a day (QID) | INTRAMUSCULAR | Status: DC | PRN

## 2013-08-18 MED ORDER — OXYCODONE HCL 5 MG/5ML PO SOLN
5.0000 mg | Freq: Once | ORAL | Status: DC | PRN
Start: 2013-08-18 — End: 2013-08-18
  Administered 2013-08-18: 5 mg via ORAL

## 2013-08-18 MED ORDER — PROPOFOL 10 MG/ML IV BOLUS
INTRAVENOUS | Status: DC | PRN
  Administered 2013-08-18: 200 mg via INTRAVENOUS

## 2013-08-18 MED ORDER — OXYCODONE HCL 5 MG PO TABS
ORAL_TABLET | ORAL | Status: AC
  Administered 2013-08-18: 5 mg
  Filled 2013-08-18: qty 1

## 2013-08-18 MED ORDER — OXYCODONE HCL 5 MG PO TABS
ORAL_TABLET | ORAL | Status: AC
  Filled 2013-08-18: qty 1

## 2013-08-18 MED ORDER — SUCCINYLCHOLINE CHLORIDE 20 MG/ML IJ SOLN
INTRAMUSCULAR | Status: DC | PRN
  Administered 2013-08-18: 120 mg via INTRAVENOUS

## 2013-08-18 MED ORDER — PATIENT'S GUIDE TO USING COUMADIN BOOK
Freq: Once | Status: AC
  Administered 2013-08-18: 18:00:00
  Filled 2013-08-18: qty 1

## 2013-08-18 MED ORDER — NEOSTIGMINE METHYLSULFATE 1 MG/ML IJ SOLN
INTRAMUSCULAR | Status: DC | PRN
  Administered 2013-08-18: 4 mg via INTRAVENOUS

## 2013-08-18 SURGICAL SUPPLY — 61 items
BANDAGE ESMARK 6X9 LF (GAUZE/BANDAGES/DRESSINGS) ×1 IMPLANT
BANDAGE GAUZE ELAST BULKY 4 IN (GAUZE/BANDAGES/DRESSINGS) ×3 IMPLANT
BIT DRILL 110X2.5XQCK CNCT (BIT) IMPLANT
BIT DRILL 2.5 (BIT) ×2
BIT DRILL STD 2.0MM (DRILL) IMPLANT
BIT DRL 110X2.5XQCK CNCT (BIT) ×1
BLADE SAW SGTL HD 18.5X60.5X1. (BLADE) ×2 IMPLANT
BLADE SURG 10 STRL SS (BLADE) IMPLANT
BNDG CMPR 9X6 STRL LF SNTH (GAUZE/BANDAGES/DRESSINGS) ×1
BNDG COHESIVE 4X5 TAN STRL (GAUZE/BANDAGES/DRESSINGS) ×2 IMPLANT
BNDG COHESIVE 6X5 TAN STRL LF (GAUZE/BANDAGES/DRESSINGS) ×1 IMPLANT
BNDG ESMARK 6X9 LF (GAUZE/BANDAGES/DRESSINGS) ×2
BUR ANKLE TM (BUR) ×1 IMPLANT
CLOTH BEACON ORANGE TIMEOUT ST (SAFETY) ×2 IMPLANT
COVER MAYO STAND STRL (DRAPES) IMPLANT
COVER SURGICAL LIGHT HANDLE (MISCELLANEOUS) ×2 IMPLANT
DRAPE INCISE IOBAN 66X45 STRL (DRAPES) ×2 IMPLANT
DRAPE OEC MINIVIEW 54X84 (DRAPES) IMPLANT
DRAPE U-SHAPE 47X51 STRL (DRAPES) ×2 IMPLANT
DRILL STANDARD 2.0MM (DRILL) ×2
DRSG ADAPTIC 3X8 NADH LF (GAUZE/BANDAGES/DRESSINGS) ×2 IMPLANT
DRSG PAD ABDOMINAL 8X10 ST (GAUZE/BANDAGES/DRESSINGS) ×1 IMPLANT
DURAPREP 26ML APPLICATOR (WOUND CARE) ×2 IMPLANT
ELECT REM PT RETURN 9FT ADLT (ELECTROSURGICAL) ×2
ELECTRODE REM PT RTRN 9FT ADLT (ELECTROSURGICAL) ×1 IMPLANT
GLOVE BIOGEL PI IND STRL 9 (GLOVE) ×1 IMPLANT
GLOVE BIOGEL PI INDICATOR 9 (GLOVE) ×1
GLOVE SURG ORTHO 9.0 STRL STRW (GLOVE) ×2 IMPLANT
GOWN PREVENTION PLUS XLARGE (GOWN DISPOSABLE) ×2 IMPLANT
GOWN SRG XL XLNG 56XLVL 4 (GOWN DISPOSABLE) ×2 IMPLANT
GOWN STRL NON-REIN XL XLG LVL4 (GOWN DISPOSABLE) ×4
INSERT TIBIAL PROLONG SZ4 P2 (Insert) ×1 IMPLANT
K-WIRE ANKLE TM 1.6 (WIRE) ×1 IMPLANT
KIT BASIN OR (CUSTOM PROCEDURE TRAY) ×2 IMPLANT
KIT CEMENT BONE 40GM (Cement) ×1 IMPLANT
KIT CEMENT MIX/DELIVERY SYS (Kit) ×1 IMPLANT
KIT ROOM TURNOVER OR (KITS) ×2 IMPLANT
NS IRRIG 1000ML POUR BTL (IV SOLUTION) ×2 IMPLANT
PACK ORTHO EXTREMITY (CUSTOM PROCEDURE TRAY) ×2 IMPLANT
PAD ARMBOARD 7.5X6 YLW CONV (MISCELLANEOUS) ×4 IMPLANT
PIN ANKLE TM 4X1500 (PIN) ×1 IMPLANT
PIN ANKLE TM 5X200 (PIN) ×2 IMPLANT
PIN CALCANEUS (PIN) ×2 IMPLANT
PLATE LOCKING DIST FIB 10H (Plate) ×1 IMPLANT
SCREW LOCK 14X2.7X NS (Screw) IMPLANT
SCREW LOCK 16X2.7X (Screw) IMPLANT
SCREW LOCKING 2.7X14 (Screw) ×2 IMPLANT
SCREW LOCKING 2.7X16 (Screw) ×2 IMPLANT
SCREW PERI 3.5X14MM W/2.7 (Screw) ×4 IMPLANT
SCREW PERI 3.5X2.7X14 (Screw) ×1 IMPLANT
SCREW PERI CORTICAL 3.5X20MM (Screw) ×1 IMPLANT
SPONGE GAUZE 4X4 12PLY (GAUZE/BANDAGES/DRESSINGS) ×2 IMPLANT
SPONGE LAP 18X18 X RAY DECT (DISPOSABLE) ×2 IMPLANT
SUCTION FRAZIER TIP 10 FR DISP (SUCTIONS) ×2 IMPLANT
SUT ETHILON 2 0 PSLX (SUTURE) ×6 IMPLANT
TALUS COMP ANKLE RT SZ4 (Ankle) ×1 IMPLANT
TIBIA TOTAL ANKLE TM ASZ4 (Ankle) ×1 IMPLANT
TOWEL OR 17X24 6PK STRL BLUE (TOWEL DISPOSABLE) ×2 IMPLANT
TOWEL OR 17X26 10 PK STRL BLUE (TOWEL DISPOSABLE) ×2 IMPLANT
TUBE CONNECTING 12X1/4 (SUCTIONS) ×2 IMPLANT
WATER STERILE IRR 1000ML POUR (IV SOLUTION) ×2 IMPLANT

## 2013-08-18 NOTE — Op Note (Signed)
OPERATIVE REPORT  DATE OF SURGERY: 08/18/2013  PATIENT:  Jonathan Yu,  63 y.o. male  PRE-OPERATIVE DIAGNOSIS:  Right Ankle Traumatic Arthritis. Shortening and malreduction of the fibula  POST-OPERATIVE DIAGNOSIS:  Right Ankle Traumatic Arthritis. Shortening and malreduction of the fibula  PROCEDURE:  Procedure(s): TOTAL ANKLE ARTHOPLASTY. Lengthening reconstruction internal fixation of the fibula. Removal of deep retained hardware. C-arm fluoroscopy   SURGEON:  Surgeon(s): Newt Minion, MD  ANESTHESIA:   general  EBL:  min ML  SPECIMEN:  No Specimen  TOURNIQUET:  * No tourniquets in log *  PROCEDURE DETAILS: Patient is a 63 year old gentleman who presents with traumatic arthritis of the right ankle. He has a valgus alignment widening of the medial joint space now reduction and shortening of the fibula. Patient initially had an open fracture this underwent to surgical interventions he eventually underwent a third surgical intervention for reconstruction of the fibula. Do to progressive traumatic arthritis patient presents at this time for total ankle arthroplasty and reconstruction of the fibula. Risks and benefits were discussed including an increased risk of infection neurovascular injury failure of the implant DVT need for additional surgery. Patient states he understands and wished to proceed at this time. Description of procedure patient was brought to the operating room and underwent a general anesthetic. After time out was called the right lower extremity was prepped using DuraPrep draped into a sterile field an [place] was used to cover all exposed skin. Attention was first focused on the Mal reduction of the fibula. A lateral incision was made 2 plates and screws were removed as well as 2 syndesmotic screws. A Weber B. osteotomy was then performed and the fibula 2 cm proximal to the joint line. The fibula was then taken down of the anterior talofibular ligament and retracted  distally. Patient has significant amount of scar tissue within the joint. The great toe time was taken to debride the anterior and posterior capsule a medial incision was made to also facilitate debridement of the medial gutter. Patient still had persistent valgus alignment but the joint was cleansed and was now mobile. The leg was then placed in the frame a transverse calcaneal pin was placed in alignment with the joints to try to reduce the valgus alignment of the hindfoot. A separate pin was then placed in the talus again in line with the joint space to further reduce the malalignment of valgus. This did facilitate alignment. 2 Steinmann pins were then placed in the tibia these were attached to the jig and this facilitate alignment of the tibia and this was aligned in both AP and lateral planes to be congruent. After alignment of the joint the milling jig was then placed and this was set to mimic the joint line. This was then milled for both the tibia and talus and further medial gutter was cleansed. The trial components were placed and this had a good stability with a +2 mm polyethylene tray. The talar and tibial components were placed and cement was used to reinforce the anchorage. C-arm fluoroscopy verified congruent mortise congruent alignment in the AP and lateral planes. The fibula was then translated laterally and stabilized with a plate and screws. The anterior talofibular ligament was reconstructed. Weightbearing radiographs confirm congruent joint space. The joint was irrigated throughout the case. Subcutaneous is closed in 2-0 Vicryl the skin was closed using nylon and staples. The wounds were covered with Adaptic orthopedic sponges AB dressing Kerlix and Coban. Patient was extubated taken to the PACU in  stable condition.  PLAN OF CARE: Admit to inpatient   PATIENT DISPOSITION:  PACU - hemodynamically stable.   Newt Minion, MD 08/18/2013 12:25 PM

## 2013-08-18 NOTE — Progress Notes (Signed)
ANTICOAGULATION CONSULT NOTE - Initial Consult  Pharmacy Consult for coumadin Indication: VTE prophylaxis  Allergies  Allergen Reactions  . Sesame Oil Hives and Shortness Of Breath    Patient Measurements: Height: 5\' 6"  (167.6 cm) Weight: 254 lb (115.214 kg) IBW/kg (Calculated) : 63.8   Temp: 96.9 F (36.1 C) (01/07 1224) Temp src: Oral (01/07 0703) BP: 132/63 mmHg (01/07 1345) Pulse Rate: 79 (01/07 1345)  Labs: No results found for this basename: HGB, HCT, PLT, APTT, LABPROT, INR, HEPARINUNFRC, CREATININE, CKTOTAL, CKMB, TROPONINI,  in the last 72 hours  Estimated Creatinine Clearance: 117.2 ml/min (by C-G formula based on Cr of 0.79).   Medical History: Past Medical History  Diagnosis Date  . Allergy   . Hyperlipidemia   . Hypertension   . DDD (degenerative disc disease), lumbar   . OSA on CPAP   . Diabetes mellitus     no meds  . Type II or unspecified type diabetes mellitus without mention of complication, not stated as uncontrolled 01/24/2011    Pneumovax given 06-2009     Medications:  Facility-administered medications prior to admission  Medication Dose Route Frequency Provider Last Rate Last Dose  . testosterone cypionate (DEPOTESTOTERONE CYPIONATE) injection 200 mg  200 mg Intramuscular Q30 days Hali Marry, MD   200 mg at 03/25/11 1058   Prescriptions prior to admission  Medication Sig Dispense Refill  . ibuprofen (ADVIL,MOTRIN) 200 MG tablet Take 800 mg by mouth as needed.      . Multiple Vitamin (MULTIVITAMIN WITH MINERALS) TABS tablet Take 1 tablet by mouth daily.      Marland Kitchen OVER THE COUNTER MEDICATION Take 1 tablet by mouth 4 (four) times daily - after meals and at bedtime. -pH balance formula Alkolete Yoli      . phentermine 30 MG capsule Take 1 capsule (30 mg total) by mouth every morning.  30 capsule  0    Assessment: 63 yo M to start coumadin per pharmacy for VTE px  S/p R ankle arthroplasty.  Coumadin score is 6. Wt 115 kg.  Baseline INR  0.96.   Goal of Therapy:  INR 2-3 Monitor platelets by anticoagulation protocol: Yes   Plan:  1. Coumadin 7.5 mg po x 1 dose today 2. Daily INR 3. Coumadin book and video ordered Eudelia Bunch, Pharm.D. [phone] 08/18/2013 3:58 PM

## 2013-08-18 NOTE — Preoperative (Signed)
Beta Blockers   Reason not to administer Beta Blockers:Not Applicable 

## 2013-08-18 NOTE — Anesthesia Postprocedure Evaluation (Signed)
  Anesthesia Post-op Note  Patient: Jonathan Yu  Procedure(s) Performed: Procedure(s) with comments: TOTAL ANKLE ARTHOPLASTY (Right) - Right Total Ankle Arthroplasty, Revision Fibular Fracture  Patient Location: PACU  Anesthesia Type:General  Level of Consciousness: awake and alert   Airway and Oxygen Therapy: Patient Spontanous Breathing  Post-op Pain: mild  Post-op Assessment: Post-op Vital signs reviewed  Post-op Vital Signs: stable  Complications: No apparent anesthesia complications

## 2013-08-18 NOTE — Anesthesia Procedure Notes (Signed)
Procedure Name: Intubation Date/Time: 08/18/2013 8:38 AM Performed by: Scheryl Darter Pre-anesthesia Checklist: Patient identified, Emergency Drugs available, Suction available, Patient being monitored and Timeout performed Patient Re-evaluated:Patient Re-evaluated prior to inductionOxygen Delivery Method: Circle system utilized Preoxygenation: Pre-oxygenation with 100% oxygen Intubation Type: IV induction Ventilation: Mask ventilation without difficulty Grade View: Grade III Tube type: Oral Tube size: 7.5 mm Number of attempts: 1 Airway Equipment and Method: Stylet Placement Confirmation: ETT inserted through vocal cords under direct vision,  positive ETCO2 and breath sounds checked- equal and bilateral Secured at: 25 cm Tube secured with: Tape Dental Injury: Teeth and Oropharynx as per pre-operative assessment

## 2013-08-18 NOTE — Transfer of Care (Signed)
Immediate Anesthesia Transfer of Care Note  Patient: Jonathan Yu  Procedure(s) Performed: Procedure(s) with comments: TOTAL ANKLE ARTHOPLASTY (Right) - Right Total Ankle Arthroplasty, Revision Fibular Fracture  Patient Location: PACU  Anesthesia Type:General  Level of Consciousness: awake, alert , oriented and sedated  Airway & Oxygen Therapy: Patient Spontanous Breathing and Patient connected to nasal cannula oxygen  Post-op Assessment: Report given to PACU RN, Post -op Vital signs reviewed and stable and Patient moving all extremities  Post vital signs: Reviewed and stable  Complications: No apparent anesthesia complications

## 2013-08-18 NOTE — H&P (Signed)
Jonathan Yu is an 63 y.o. male.   Chief Complaint: Osteoarthritis right ankle HPI: Patient is a 63 year old gentleman with traumatic arthritis right ankle. He has failed prolonged conservative therapy and presents at this time for total ankle arthroplasty.  Past Medical History  Diagnosis Date  . Allergy   . Hyperlipidemia   . Hypertension   . DDD (degenerative disc disease), lumbar   . OSA on CPAP   . Diabetes mellitus     no meds  . Type II or unspecified type diabetes mellitus without mention of complication, not stated as uncontrolled 01/24/2011    Pneumovax given 06-2009     Past Surgical History  Procedure Laterality Date  . Rtc repair    . Ureteral stent    . Ankle arthrodesis      Family History  Problem Relation Age of Onset  . Cancer Mother 49    Lung   Social History:  reports that he has been smoking Pipe and Cigars.  He does not have any smokeless tobacco history on file. He reports that he drinks alcohol. He reports that he does not use illicit drugs.  Allergies: No Known Allergies  No prescriptions prior to admission    No results found for this or any previous visit (from the past 48 hour(s)). No results found.  Review of Systems  All other systems reviewed and are negative.    There were no vitals taken for this visit. Physical Exam   On examination patient is crepitation with range of motion of the right ankle there is tenderness to palpation. He has a good dorsalis pedis pulse. Radiographs show destruction of the tibial talar joint with no significant malalignment. Assessment/Plan Assessment: Traumatic arthritis right ankle.  Plan: Will plan for right total ankle arthroplasty. Risks and benefits were discussed including risk of infection neurovascular injury pain failure of the implant need for additional surgery. Patient states he understands and wished to proceed at this time.  Jonathan Yu V 08/18/2013, 6:30 AM

## 2013-08-18 NOTE — Progress Notes (Signed)
Orthopedic Tech Progress Note Patient Details:  Jonathan Yu 11/01/1950 [id number] Patient said he has cam walker boot at home and did not need. Patient ID: Javed Cotto, male   DOB: [birth year], 63 y.o.   MRN: [id number]   Braulio Bosch 08/18/2013, 8:05 PM

## 2013-08-18 NOTE — Progress Notes (Signed)
Patient's glasses given to [facility]- dtr, in surgical waiting area.

## 2013-08-18 NOTE — Anesthesia Preprocedure Evaluation (Addendum)
Anesthesia Evaluation  Patient identified by MRN, date of birth, ID band Patient awake    Reviewed: Allergy & Precautions, H&P , NPO status , Patient's Chart, lab work & pertinent test results  History of Anesthesia Complications Negative for: history of anesthetic complications  Airway       Dental   Pulmonary sleep apnea , Current Smoker,          Cardiovascular hypertension,     Neuro/Psych  Neuromuscular disease    GI/Hepatic   Endo/Other  negative endocrine ROSneg diabetesMorbid obesityDenies DM  Renal/GU Renal InsufficiencyRenal diseasenegative Renal ROS     Musculoskeletal   Abdominal   Peds  Hematology   Anesthesia Other Findings   Reproductive/Obstetrics                          Anesthesia Physical Anesthesia Plan  ASA: III  Anesthesia Plan: General   Post-op Pain Management:    Induction: Intravenous  Airway Management Planned: Oral ETT  Additional Equipment:   Intra-op Plan:   Post-operative Plan: Extubation in OR  Informed Consent: I have reviewed the patients History and Physical, chart, labs and discussed the procedure including the risks, benefits and alternatives for the proposed anesthesia with the patient or authorized representative who has indicated his/her understanding and acceptance.   Dental advisory given  Plan Discussed with: CRNA and Surgeon  Anesthesia Plan Comments:         Anesthesia Quick Evaluation

## 2013-08-19 LAB — PROTIME-INR
INR: 1 (ref 0.00–1.49)
Prothrombin Time: 13 seconds (ref 11.6–15.2)

## 2013-08-19 MED ORDER — DIPHENHYDRAMINE HCL 12.5 MG/5ML PO ELIX
12.5000 mg | ORAL_SOLUTION | Freq: Four times a day (QID) | ORAL | Status: DC | PRN
Start: 2013-08-19 — End: 2013-08-21
  Administered 2013-08-19 – 2013-08-21 (×3): 25 mg via ORAL
  Filled 2013-08-19 (×3): qty 10

## 2013-08-19 MED ORDER — OXYCODONE HCL 5 MG PO TABS
5.0000 mg | ORAL_TABLET | ORAL | Status: DC | PRN
  Administered 2013-08-20 – 2013-08-21 (×7): 10 mg via ORAL
  Filled 2013-08-19 (×7): qty 2

## 2013-08-19 MED ORDER — WARFARIN SODIUM 7.5 MG PO TABS
7.5000 mg | ORAL_TABLET | Freq: Once | ORAL | Status: AC
  Administered 2013-08-19: 7.5 mg via ORAL
  Filled 2013-08-19: qty 1

## 2013-08-19 NOTE — Progress Notes (Signed)
ANTICOAGULATION CONSULT NOTE - Follow Up Consult  Pharmacy Consult for coumadin Indication: VTE prophylaxis  Allergies  Allergen Reactions  . Sesame Oil Hives and Shortness Of Breath    Patient Measurements: Height: 5\' 6"  (167.6 cm) Weight: 254 lb (115.214 kg) IBW/kg (Calculated) : 63.8   Vital Signs: Temp: 98.1 F (36.7 C) (01/08 0626) BP: 125/70 mmHg (01/08 0626) Pulse Rate: 95 (01/08 0626)  Labs:  Recent Labs  08/19/13 0440  LABPROT 13.0  INR 1.00    Estimated Creatinine Clearance: 117.2 ml/min (by C-G formula based on Cr of 0.79).  Assessment: Patient is a 63 y.o M s/p right TKA on coumadin for VTE prophylaxis.  INR is 1.0 with coumadin started last night.  No bleeding documented.  Goal of Therapy:  INR 2-3    Plan:  1) coumadin 7.5mg  PO x1 today  Dimetri Armitage P 08/19/2013,9:12 AM

## 2013-08-19 NOTE — Progress Notes (Signed)
Patient states that he wears CPAP at home but not on a regular basis.  Patient brought CPAP mask with him, but declined CPAP at this time.  VS stable, SPO2 = 95% on room air.  Will continue to monitor patient's VS and SPO2.

## 2013-08-19 NOTE — Evaluation (Signed)
Physical Therapy Evaluation Patient Details Name: Jonathan Yu MRN: [id number] DOB: 11-Jul-1951 Today's Date: 08/19/2013 Time: 1010-1033 PT Time Calculation (min): 23 min  PT Assessment / Plan / Recommendation History of Present Illness  Pt is a 63 y/o male admitted s/p total ankle arthroplasty.   Clinical Impression  This patient presents with acute pain and decreased functional independence following the above mentioned procedure. At the time of PT eval, pt was able to perform functional mobility and ambulation with min guard and occasional assist for movement/support of the RLE. This patient is appropriate for skilled PT interventions to address functional limitations, improve safety and independence with functional mobility, and return to PLOF.     PT Assessment  Patient needs continued PT services    Follow Up Recommendations  Outpatient PT (When appropriate per post-op protocol)    Does the patient have the potential to tolerate intense rehabilitation      Barriers to Discharge        Equipment Recommendations  Rolling walker with 5" wheels (Possibly - lent to a family member?)    Recommendations for Other Services     Frequency Min 5X/week    Precautions / Restrictions Precautions Precautions: Fall Restrictions Weight Bearing Restrictions: Yes RLE Weight Bearing: Non weight bearing   Pertinent Vitals/Pain Pt reports minimal pain throughout session, and 7.5/10 pain at end of session. Nursing notified.       Mobility  Bed Mobility Overal bed mobility: Needs Assistance Bed Mobility: Supine to Sit Supine to sit: Min assist General bed mobility comments: Assist for movement and support of RLE to transition to EOB. Transfers Overall transfer level: Needs assistance Equipment used: Rolling walker (2 wheeled) Transfers: Sit to/from Stand Sit to Stand: Min guard General transfer comment: VC's for hand placement on seated surface for safety.   Ambulation/Gait Ambulation/Gait assistance: Min guard Ambulation Distance (Feet): 40 Feet Assistive device: Rolling walker (2 wheeled) Gait Pattern/deviations: Step-to pattern Gait velocity: Decreased Gait velocity interpretation: Below normal speed for age/gender General Gait Details: VC's for NWB status and safety awareness with the RW.     Exercises Total Joint Exercises Ankle Circles/Pumps: 10 reps Quad Sets: 10 reps   PT Diagnosis: Difficulty walking;Acute pain  PT Problem List: Decreased strength;Decreased range of motion;Decreased activity tolerance;Decreased balance;Decreased mobility;Decreased knowledge of use of DME;Decreased safety awareness;Decreased knowledge of precautions;Pain PT Treatment Interventions: DME instruction;Gait training;Stair training;Functional mobility training;Therapeutic activities;Therapeutic exercise;Neuromuscular re-education;Patient/family education     PT Goals(Current goals can be found in the care plan section) Acute Rehab PT Goals Patient Stated Goal: To return home PT Goal Formulation: With patient Time For Goal Achievement: 09/02/13 Potential to Achieve Goals: Good  Visit Information  Last PT Received On: 08/19/13 Assistance Needed: +1 History of Present Illness: Pt is a 63 y/o male admitted s/p total ankle arthroplasty.        Prior [place] expects to be discharged to:: Private residence Living Arrangements: Alone Available Help at Discharge: Family;Available 24 hours/day Type of Home: House Home Access: Stairs to enter [employer] of Steps: 4 Entrance Stairs-Rails: None Home Layout: Two level;Full bath on main level Home Equipment: Walker - 2 wheels;Bedside commode;Crutches;Shower seat;Cane - single point;Electric scooter Additional Comments: 6 children that will be caring for him Prior Function Level of Independence: Independent with assistive  device(s) Communication Communication: No difficulties Dominant Hand: Right    Cognition  Cognition Arousal/Alertness: Awake/alert Behavior During Therapy: WFL for tasks assessed/performed Overall Cognitive Status: Within Functional Limits for tasks assessed  Extremity/Trunk Assessment Upper Extremity Assessment Upper Extremity Assessment: Defer to OT evaluation Lower Extremity Assessment Lower Extremity Assessment: Overall WFL for tasks assessed;RLE deficits/detail RLE Deficits / Details: Decreased strength and AROM consistent with TAA RLE: Unable to fully assess due to pain Cervical / Trunk Assessment Cervical / Trunk Assessment: Normal   Balance Balance Overall balance assessment: No apparent balance deficits (not formally assessed)  End of Session PT - End of Session Equipment Utilized During Treatment: Gait belt Activity Tolerance: Patient tolerated treatment well Patient left: in chair;with call bell/phone within reach Nurse Communication: Mobility status;Patient requests pain meds  GP     Jolyn Lent 08/19/2013, 12:34 PM  Jolyn Lent, [place], DPT [phone]

## 2013-08-19 NOTE — Discharge Instructions (Signed)
Information on my medicine - Coumadin®   (Warfarin) ° °This medication education was reviewed with me or my healthcare representative as part of my discharge preparation.  The pharmacist that spoke with me during my hospital stay was:  Demetri Goshert P, RPH ° °Why was Coumadin prescribed for you? °Coumadin was prescribed for you because you have a blood clot or a medical condition that can cause an increased risk of forming blood clots. Blood clots can cause serious health problems by blocking the flow of blood to the heart, lung, or brain. Coumadin can prevent harmful blood clots from forming. °As a reminder your indication for Coumadin is:   Blood Clot Prevention After Orthopedic Surgery ° °What test will check on my response to Coumadin? °While on Coumadin (warfarin) you will need to have an INR test regularly to ensure that your dose is keeping you in the desired range. The INR (international normalized ratio) number is calculated from the result of the laboratory test called prothrombin time (PT). ° °If an INR APPOINTMENT HAS NOT ALREADY BEEN MADE FOR YOU please schedule an appointment to have this lab work done by your health care provider within 7 days. °Your INR goal is usually a number between:  2 to 3 or your provider may give you a more narrow range like 2-2.5.  Ask your health care provider during an office visit what your goal INR is. ° °What  do you need to  know  About  COUMADIN? °Take Coumadin (warfarin) exactly as prescribed by your healthcare provider about the same time each day.  DO NOT stop taking without talking to the doctor who prescribed the medication.  Stopping without other blood clot prevention medication to take the place of Coumadin may increase your risk of developing a new clot or stroke.  Get refills before you run out. ° °What do you do if you miss a dose? °If you miss a dose, take it as soon as you remember on the same day then continue your regularly scheduled regimen the next day.   Do not take two doses of Coumadin at the same time. ° °Important Safety Information °A possible side effect of Coumadin (Warfarin) is an increased risk of bleeding. You should call your healthcare provider right away if you experience any of the following: °  Bleeding from an injury or your nose that does not stop. °  Unusual colored urine (red or dark brown) or unusual colored stools (red or black). °  Unusual bruising for unknown reasons. °  A serious fall or if you hit your head (even if there is no bleeding). ° °Some foods or medicines interact with Coumadin® (warfarin) and might alter your response to warfarin. To help avoid this: °  Eat a balanced diet, maintaining a consistent amount of Vitamin K. °  Notify your provider about major diet changes you plan to make. °  Avoid alcohol or limit your intake to 1 drink for women and 2 drinks for men per day. °(1 drink is 5 oz. wine, 12 oz. beer, or 1.5 oz. liquor.) ° °Make sure that ANY health care provider who prescribes medication for you knows that you are taking Coumadin (warfarin).  Also make sure the healthcare provider who is monitoring your Coumadin knows when you have started a new medication including herbals and non-prescription products. ° °Coumadin® (Warfarin)  Major Drug Interactions  °Increased Warfarin Effect Decreased Warfarin Effect  °Alcohol (large quantities) °Antibiotics (esp. Septra/Bactrim, Flagyl, Cipro) °Amiodarone (Cordarone) °Aspirin (  ASA) °Cimetidine (Tagamet) °Megestrol (Megace) °NSAIDs (ibuprofen, naproxen, etc.) °Piroxicam (Feldene) °Propafenone (Rythmol SR) °Propranolol (Inderal) °Isoniazid (INH) °Posaconazole (Noxafil) Barbiturates (Phenobarbital) °Carbamazepine (Tegretol) °Chlordiazepoxide (Librium) °Cholestyramine (Questran) °Griseofulvin °Oral Contraceptives °Rifampin °Sucralfate (Carafate) °Vitamin K  ° °Coumadin® (Warfarin) Major Herbal Interactions  °Increased Warfarin Effect Decreased Warfarin Effect  °Garlic °Ginseng °Ginkgo  biloba Coenzyme Q10 °Green tea °St. John’s wort   ° °Coumadin® (Warfarin) FOOD Interactions  °Eat a consistent number of servings per week of foods HIGH in Vitamin K °(1 serving = ½ cup)  °Collards (cooked, or boiled & drained) °Kale (cooked, or boiled & drained) °Mustard greens (cooked, or boiled & drained) °Parsley *serving size only = ¼ cup °Spinach (cooked, or boiled & drained) °Swiss chard (cooked, or boiled & drained) °Turnip greens (cooked, or boiled & drained)  °Eat a consistent number of servings per week of foods MEDIUM-HIGH in Vitamin K °(1 serving = 1 cup)  °Asparagus (cooked, or boiled & drained) °Broccoli (cooked, boiled & drained, or raw & chopped) °Brussel sprouts (cooked, or boiled & drained) *serving size only = ½ cup °Lettuce, raw (green leaf, endive, romaine) °Spinach, raw °Turnip greens, raw & chopped  ° °These websites have more information on Coumadin (warfarin):  www.coumadin.com; °www.ahrq.gov/consumer/coumadin.htm; ° ° °

## 2013-08-19 NOTE — Plan of Care (Signed)
Problem: Consults Goal: Diagnosis- Total Joint Replacement Right Total Ankle

## 2013-08-19 NOTE — Progress Notes (Signed)
Patient ID: Jonathan Yu, male   DOB: 1951/05/28, 63 y.o.   MRN: [id number] Postoperative day 1 right total ankle arthroplasty. Patient has good motion of the toes. No complaints at this time. Physical therapy progressive ambulation nonweightbearing on the right. Patient to wear his fracture boot while up ambulating. Plan for discharge to home tomorrow.

## 2013-08-20 LAB — PROTIME-INR
INR: 1.25 (ref 0.00–1.49)
Prothrombin Time: 15.4 seconds — ABNORMAL HIGH (ref 11.6–15.2)

## 2013-08-20 LAB — GLUCOSE, CAPILLARY: Glucose-Capillary: 114 mg/dL — ABNORMAL HIGH (ref 70–99)

## 2013-08-20 MED ORDER — BISACODYL 10 MG RE SUPP: 10.0000 mg | Freq: Every day | RECTAL | Status: DC | PRN

## 2013-08-20 MED ORDER — WARFARIN SODIUM 7.5 MG PO TABS
7.5000 mg | ORAL_TABLET | Freq: Once | ORAL | Status: AC
  Administered 2013-08-20: 7.5 mg via ORAL
  Filled 2013-08-20 (×2): qty 1

## 2013-08-20 MED ORDER — DOCUSATE SODIUM 100 MG PO CAPS
100.0000 mg | ORAL_CAPSULE | Freq: Two times a day (BID) | ORAL | Status: DC
  Administered 2013-08-20 (×2): 100 mg via ORAL
  Filled 2013-08-20 (×4): qty 1

## 2013-08-20 MED ORDER — SENNOSIDES-DOCUSATE SODIUM 8.6-50 MG PO TABS
1.0000 | ORAL_TABLET | Freq: Every evening | ORAL | Status: DC | PRN
  Administered 2013-08-20: 1 via ORAL
  Filled 2013-08-20: qty 1

## 2013-08-20 NOTE — Progress Notes (Signed)
Patient ID: Jonathan Yu, male   DOB: February 25, 1951, 63 y.o.   MRN: [id number] Patient is postoperative day 2 status post right total ankle arthroplasty. The incision is clean and dry. He moves his toes well. He was ambulating well with therapy yesterday but complains of increasing pain and swelling in his left shoulder and right ankle. Patient has had reactions to both Vicodin and Percocet the IV Dilaudid seems to be helping him the best. Will keep him in one additional night for pain control discharge Saturday morning. Prescription for stool softeners today.

## 2013-08-20 NOTE — Progress Notes (Signed)
Physical Therapy Treatment Patient Details Name: Jonathan Yu MRN: [id number] DOB: 05/24/1951 Today's Date: 08/20/2013 Time: 1017-1040 PT Time Calculation (min): 23 min  PT Assessment / Plan / Recommendation  History of Present Illness Pt is a 63 y/o male admitted s/p total ankle arthroplasty.    PT Comments   Pt very pleasant & willing to participate in therapy.  Trialed ambulation with RW vs crutches per pt's request.  Pt at min guard level for both but appeared more unsteady with crutches; pt even aware that he was more steady with walker at this time.  Pt also wanting to perform stairs with crutches- required min (A) for balance.     Follow Up Recommendations  Outpatient PT (when appropriate per post-op protocol)     Does the patient have the potential to tolerate intense rehabilitation     Barriers to Discharge        Equipment Recommendations  Rolling walker with 5" wheels    Recommendations for Other Services    Frequency Min 5X/week   Progress towards PT Goals Progress towards PT goals: Progressing toward goals  Plan Current plan remains appropriate    Precautions / Restrictions Precautions Precautions: Fall Restrictions RLE Weight Bearing: Non weight bearing   Pertinent Vitals/Pain "it's throbbing now" with ambulation.  Repositioned for comfort propped up on 2 pillows.      Mobility  Bed Mobility Overal bed mobility: Modified Independent Bed Mobility: Supine to Sit Supine to sit: Modified independent (Device/Increase time) Transfers Overall transfer level: Needs assistance Equipment used: Rolling walker (2 wheeled);Crutches Transfers: Sit to/from Stand Sit to Stand: Min guard General transfer comment: cues for hand placement with crutches/RW & safe placement when using crutches.   Ambulation/Gait Ambulation/Gait assistance: Min guard Ambulation Distance (Feet): 30 Feet Assistive device: Rolling walker (2 wheeled) Gait Pattern/deviations: Step-to  pattern General Gait Details: guarding for safety.  Pt wanted to trial with crutches.  Still at min guard level with crutches but more unsteady.   Stairs: Yes Stairs assistance: Min assist Stair Management: No rails;Step to pattern;Forwards;With crutches Number of Stairs: 3 General stair comments: (A) for balance.  Pt requesting to use crutches as he did not want to go backwards with walker.  Cues for crutch placement      PT Goals (current goals can now be found in the care plan section) Acute Rehab PT Goals PT Goal Formulation: With patient Time For Goal Achievement: 09/02/13 Potential to Achieve Goals: Good  Visit Information  Last PT Received On: 08/20/13 Assistance Needed: +1 History of Present Illness: Pt is a 63 y/o male admitted s/p total ankle arthroplasty.     Subjective Data      Cognition  Cognition Arousal/Alertness: Awake/alert Behavior During Therapy: WFL for tasks assessed/performed Overall Cognitive Status: Within Functional Limits for tasks assessed    Balance     End of Session PT - End of Session Activity Tolerance: Patient tolerated treatment well Patient left: in chair;with call bell/phone within reach Nurse Communication: Mobility status;Patient requests pain meds   GP     Sena Hitch 08/20/2013, 10:52 AM   Sarajane Marek, PTA [phone] 08/20/2013

## 2013-08-20 NOTE — Progress Notes (Signed)
ANTICOAGULATION CONSULT NOTE - Follow Up Consult  Pharmacy Consult for coumadin Indication: VTE prophylaxis  Allergies  Allergen Reactions  . Sesame Oil Hives and Shortness Of Breath    Patient Measurements: Height: 5\' 6"  (167.6 cm) Weight: 254 lb (115.214 kg) IBW/kg (Calculated) : 63.8  Vital Signs: Temp: 98.1 F (36.7 C) (01/09 1220) Temp src: Oral (01/09 1220) BP: 126/55 mmHg (01/09 1220) Pulse Rate: 88 (01/09 1220)  Labs:  Recent Labs  08/19/13 0440 08/20/13 0556  LABPROT 13.0 15.4*  INR 1.00 1.25    Estimated Creatinine Clearance: 117.2 ml/min (by C-G formula based on Cr of 0.79).  Assessment: Patient is a 63 y.o M s/p right ankle arthroplasty on coumadin for VTE prophylaxis.  INR is 1.25, trending up after Coumadin 7.5mg  x 2 doses.    Goal of Therapy:  INR 2-3   Plan:  1) Repeat Coumadin 7.5mg  PO x1 today  [employer], Pharm.D., BCPS Clinical Pharmacist Pager [phone] 08/20/2013 1:22 PM

## 2013-08-21 LAB — PROTIME-INR
INR: 1.32 (ref 0.00–1.49)
Prothrombin Time: 16.1 seconds — ABNORMAL HIGH (ref 11.6–15.2)

## 2013-08-21 LAB — GLUCOSE, CAPILLARY: Glucose-Capillary: 98 mg/dL (ref 70–99)

## 2013-08-21 MED ORDER — METHOCARBAMOL 500 MG PO TABS: 500.0000 mg | ORAL_TABLET | Freq: Three times a day (TID) | ORAL | Status: DC

## 2013-08-21 MED ORDER — OXYCODONE-ACETAMINOPHEN 5-325 MG PO TABS: 1.0000 | ORAL_TABLET | ORAL | Status: DC | PRN

## 2013-08-21 NOTE — Discharge Summary (Signed)
Physician Discharge Summary  Patient ID: Jonathan Yu MRN: [id number] DOB/AGE: [birth year] 63 y.o.  Admit date: 08/18/2013 Discharge date: 08/21/2013  Admission Diagnoses: Traumatic arthritis right ankle.  Discharge Diagnoses: Traumatic arthritis right ankle Active Problems:   History of total ankle replacement   Discharged Condition: stable  Hospital Course: Patient's hospital course was essentially unremarkable. He underwent a total ankle arthroplasty postoperatively patient progressed well had no complaints and was discharged to home in stable condition.  Consults: None  Significant Diagnostic Studies: labs: Routine labs  Treatments: surgery: See operative note  Discharge Exam: Blood pressure 131/59, pulse 75, temperature 98.6 F (37 C), temperature source Oral, resp. rate 18, height 5\' 6"  (1.676 m), weight 115.214 kg (254 lb), SpO2 95.00%. Incision/Wound: dressing clean dry and intact  Disposition: 01-Home or Self Care  Discharge Orders   Future Orders Complete By Expires   Call MD / Call 911  As directed    Comments:     If you experience chest pain or shortness of breath, CALL 911 and be transported to the hospital emergency room.  If you develope a fever above 101 F, pus (white drainage) or increased drainage or redness at the wound, or calf pain, call your surgeon's office.   Constipation Prevention  As directed    Comments:     Drink plenty of fluids.  Prune juice may be helpful.  You may use a stool softener, such as Colace (over the counter) 100 mg twice a day.  Use MiraLax (over the counter) for constipation as needed.   Diet - low sodium heart healthy  As directed    Increase activity slowly as tolerated  As directed        Medication List         ibuprofen 200 MG tablet  Commonly known as:  ADVIL,MOTRIN  Take 800 mg by mouth as needed.     methocarbamol 500 MG tablet  Commonly known as:  ROBAXIN  Take 1 tablet (500 mg total) by mouth 3 (three) times  daily.     multivitamin with minerals Tabs tablet  Take 1 tablet by mouth daily.     OVER THE COUNTER MEDICATION  Take 1 tablet by mouth 4 (four) times daily - after meals and at bedtime. -pH balance formula Alkolete Yoli     oxyCODONE-acetaminophen 5-325 MG per tablet  Commonly known as:  ROXICET  Take 1 tablet by mouth every 4 (four) hours as needed for severe pain.     phentermine 30 MG capsule  Take 1 capsule (30 mg total) by mouth every morning.           Follow-up Information   Follow up with DUDA,MARCUS V, MD In 1 week.   Specialty:  Orthopedic Surgery   Contact information:   [place] [address] [phone]       Signed: Newt Minion 08/21/2013, 8:48 AM

## 2013-08-21 NOTE — Progress Notes (Signed)
Physical Therapy Treatment Patient Details Name: Jonathan Yu MRN: [id number] DOB: 12-Apr-1951 Today's Date: 08/21/2013 Time: 9924-2683 PT Time Calculation (min): 23 min  PT Assessment / Plan / Recommendation  History of Present Illness Pt is a 63 y/o male admitted s/p total ankle arthroplasty.    PT Comments   Pt making good progress. A little more steady with crutches this session.  Follow Up Recommendations  Outpatient PT     Equipment Recommendations  Rolling walker with 5" wheels    Frequency Min 5X/week   Progress towards PT Goals Progress towards PT goals: Progressing toward goals  Plan Current plan remains appropriate    Precautions / Restrictions Precautions Precautions: Fall Restrictions RLE Weight Bearing: Non weight bearing    Mobility  Bed Mobility Overal bed mobility: Modified Independent Supine to sit: Modified independent (Device/Increase time) General bed mobility comments: with bed flat and no rails used. Transfers Overall transfer level: Modified independent Equipment used: Rolling walker (2 wheeled) Sit to Stand: Modified independent (Device/Increase time);Min guard General transfer comment: mod I with using RW and supervion/min guard for use of crutches with cues on technique and crutch position with transfers Ambulation/Gait Ambulation/Gait assistance: Min guard;Supervision Ambulation Distance (Feet): 50 Feet Assistive device: Rolling walker (2 wheeled) Gait Pattern/deviations: Step-to pattern Gait velocity: Decreased General Gait Details: mod i with use of walker. Min guard assist for use of crutches for 10 feet Stairs assistance: Min assist Stair Management: No rails;Step to pattern;Forwards;With crutches Number of Stairs: 3 General stair comments: asssist for balance with turning at top of stairs and with going down 1st step, min guard with last steps. Pt reports his son will be there to help him.       PT Goals (current goals can now be  found in the care plan section) Acute Rehab PT Goals Patient Stated Goal: To return home PT Goal Formulation: With patient Time For Goal Achievement: 09/02/13 Potential to Achieve Goals: Good  Visit Information  Last PT Received On: 08/21/13 Assistance Needed: +1 History of Present Illness: Pt is a 63 y/o male admitted s/p total ankle arthroplasty.     Subjective Data  Patient Stated Goal: To return home   Cognition  Cognition Arousal/Alertness: Awake/alert Behavior During Therapy: WFL for tasks assessed/performed Overall Cognitive Status: Within Functional Limits for tasks assessed    End of Session PT - End of Session Equipment Utilized During Treatment: Gait belt Activity Tolerance: Patient tolerated treatment well Patient left: in chair;with call bell/phone within reach Nurse Communication: Mobility status;Patient requests pain meds   GP     Willow Ora 08/21/2013, 12:35 PM  Willow Ora, PTA Office- [phone]

## 2013-08-21 NOTE — Progress Notes (Signed)
Patient provided with discharge instructions and follow up information. He is going home at this time with outpatient PT. He has been educated on weightbearing status and signs/symptoms of infection. He is going home with support from his wife.

## 2013-08-23 ENCOUNTER — Encounter (HOSPITAL_COMMUNITY): Payer: Self-pay | Admitting: Orthopedic Surgery

## 2013-09-06 ENCOUNTER — Other Ambulatory Visit (HOSPITAL_COMMUNITY): Payer: Self-pay | Admitting: Orthopedic Surgery

## 2013-09-06 ENCOUNTER — Encounter (HOSPITAL_COMMUNITY): Payer: Self-pay | Admitting: Pharmacy Technician

## 2013-09-07 ENCOUNTER — Encounter (HOSPITAL_COMMUNITY): Payer: Self-pay | Admitting: *Deleted

## 2013-09-07 ENCOUNTER — Telehealth: Payer: Self-pay | Admitting: *Deleted

## 2013-09-07 DIAGNOSIS — S82899B Other fracture of unspecified lower leg, initial encounter for open fracture type I or II: Secondary | ICD-10-CM

## 2013-09-07 MED ORDER — CEFAZOLIN SODIUM-DEXTROSE 2-3 GM-% IV SOLR
2.0000 g | INTRAVENOUS | Status: AC
  Administered 2013-09-08: 2 g via INTRAVENOUS

## 2013-09-07 NOTE — Addendum Note (Signed)
Addended by: Teddy Spike on: 09/07/2013 03:11 PM   Modules accepted: Orders

## 2013-09-07 NOTE — Telephone Encounter (Signed)
Calling for referral for pt to have surgery referral sent for [facility] pt is scheduled for tomorrow and will need to have the referral back dated for yesterday so that insurance will cover the visits. I gave this information to J. Todd for completion and informed her that this is Clinical cytogeneticist, Lahoma Crocker

## 2013-09-07 NOTE — Telephone Encounter (Signed)
Pt called back and stated that the referral will need a procedure code. He will call J.Todd back w/procedure code.Audelia Hives [place]

## 2013-09-08 ENCOUNTER — Encounter (HOSPITAL_COMMUNITY)
Admission: RE | Disposition: A | Discharge status: Home / Self Care | Payer: Self-pay | Source: Ambulatory Visit | Attending: Orthopedic Surgery

## 2013-09-08 ENCOUNTER — Encounter (HOSPITAL_COMMUNITY): Payer: 59 | Admitting: Anesthesiology

## 2013-09-08 ENCOUNTER — Ambulatory Visit (HOSPITAL_COMMUNITY): Payer: 59 | Admitting: Anesthesiology

## 2013-09-08 ENCOUNTER — Ambulatory Visit (HOSPITAL_COMMUNITY)
Admission: RE | Admit: 2013-09-08 | Discharge: 2013-09-08 | Disposition: A | Discharge status: Home / Self Care | Payer: 59 | Source: Ambulatory Visit | Attending: Orthopedic Surgery | Admitting: Orthopedic Surgery

## 2013-09-08 ENCOUNTER — Encounter (HOSPITAL_COMMUNITY): Payer: Self-pay

## 2013-09-08 DIAGNOSIS — M249 Joint derangement, unspecified: Secondary | ICD-10-CM | POA: Insufficient documentation

## 2013-09-08 DIAGNOSIS — G4733 Obstructive sleep apnea (adult) (pediatric): Secondary | ICD-10-CM | POA: Insufficient documentation

## 2013-09-08 DIAGNOSIS — S93439A Sprain of tibiofibular ligament of unspecified ankle, initial encounter: Secondary | ICD-10-CM

## 2013-09-08 DIAGNOSIS — F17200 Nicotine dependence, unspecified, uncomplicated: Secondary | ICD-10-CM | POA: Insufficient documentation

## 2013-09-08 DIAGNOSIS — M24176 Other articular cartilage disorders, unspecified foot: Secondary | ICD-10-CM | POA: Insufficient documentation

## 2013-09-08 DIAGNOSIS — E119 Type 2 diabetes mellitus without complications: Secondary | ICD-10-CM | POA: Insufficient documentation

## 2013-09-08 DIAGNOSIS — N289 Disorder of kidney and ureter, unspecified: Secondary | ICD-10-CM | POA: Insufficient documentation

## 2013-09-08 DIAGNOSIS — E785 Hyperlipidemia, unspecified: Secondary | ICD-10-CM | POA: Insufficient documentation

## 2013-09-08 DIAGNOSIS — I10 Essential (primary) hypertension: Secondary | ICD-10-CM | POA: Insufficient documentation

## 2013-09-08 DIAGNOSIS — Z96669 Presence of unspecified artificial ankle joint: Secondary | ICD-10-CM | POA: Insufficient documentation

## 2013-09-08 DIAGNOSIS — M24173 Other articular cartilage disorders, unspecified ankle: Secondary | ICD-10-CM | POA: Insufficient documentation

## 2013-09-08 HISTORY — PX: ORIF ANKLE FRACTURE: SHX5408

## 2013-09-08 LAB — BASIC METABOLIC PANEL
BUN: 19 mg/dL (ref 6–23)
CO2: 26 mEq/L (ref 19–32)
Calcium: 9.3 mg/dL (ref 8.4–10.5)
Chloride: 106 mEq/L (ref 96–112)
Creatinine, Ser: 0.91 mg/dL (ref 0.50–1.35)
GFR calc Af Amer: >90 mL/min (ref >90)
GFR calc non Af Amer: >90 mL/min (ref >90)
Glucose, Bld: 96 mg/dL (ref 70–99)
Potassium: 4.2 mEq/L (ref 3.7–5.3)
Sodium: 144 mEq/L (ref 137–147)

## 2013-09-08 LAB — CBC
HCT: 38.1 % — ABNORMAL LOW (ref 39.0–52.0)
Hemoglobin: 13 g/dL (ref 13.0–17.0)
MCH: 30.2 pg (ref 26.0–34.0)
MCHC: 34.1 g/dL (ref 30.0–36.0)
MCV: 88.6 fL (ref 78.0–100.0)
Platelets: 297 10*3/uL (ref 150–400)
RBC: 4.3 MIL/uL (ref 4.22–5.81)
RDW: 12.8 % (ref 11.5–15.5)
WBC: 7.4 10*3/uL (ref 4.0–10.5)

## 2013-09-08 LAB — GLUCOSE, CAPILLARY: Glucose-Capillary: 97 mg/dL (ref 70–99)

## 2013-09-08 SURGERY — OPEN REDUCTION INTERNAL FIXATION (ORIF) ANKLE FRACTURE
Anesthesia: General | Site: Ankle | Laterality: Right

## 2013-09-08 MED ORDER — ONDANSETRON HCL 4 MG/2ML IJ SOLN
INTRAMUSCULAR | Status: AC
  Filled 2013-09-08: qty 2

## 2013-09-08 MED ORDER — LIDOCAINE HCL (CARDIAC) 20 MG/ML IV SOLN
INTRAVENOUS | Status: AC
  Filled 2013-09-08: qty 5

## 2013-09-08 MED ORDER — PROPOFOL 10 MG/ML IV BOLUS
INTRAVENOUS | Status: DC | PRN
  Administered 2013-09-08: 200 mg via INTRAVENOUS

## 2013-09-08 MED ORDER — DEXAMETHASONE SODIUM PHOSPHATE 10 MG/ML IJ SOLN
INTRAMUSCULAR | Status: AC
  Filled 2013-09-08: qty 1

## 2013-09-08 MED ORDER — DIPHENHYDRAMINE HCL 50 MG/ML IJ SOLN
6.2500 mg | Freq: Once | INTRAMUSCULAR | Status: AC
  Administered 2013-09-08: 6.25 mg via INTRAVENOUS

## 2013-09-08 MED ORDER — MIDAZOLAM HCL 5 MG/5ML IJ SOLN
INTRAMUSCULAR | Status: DC | PRN
  Administered 2013-09-08: 2 mg via INTRAVENOUS

## 2013-09-08 MED ORDER — HYDROMORPHONE HCL PF 1 MG/ML IJ SOLN
INTRAMUSCULAR | Status: AC
  Filled 2013-09-08: qty 1

## 2013-09-08 MED ORDER — DEXAMETHASONE SODIUM PHOSPHATE 4 MG/ML IJ SOLN
INTRAMUSCULAR | Status: DC | PRN
  Administered 2013-09-08: 10 mg via INTRAVENOUS

## 2013-09-08 MED ORDER — 0.9 % SODIUM CHLORIDE (POUR BTL) OPTIME
TOPICAL | Status: DC | PRN
  Administered 2013-09-08: 1000 mL

## 2013-09-08 MED ORDER — HYDROMORPHONE HCL PF 1 MG/ML IJ SOLN
0.2500 mg | INTRAMUSCULAR | Status: DC | PRN
  Administered 2013-09-08 (×4): 0.5 mg via INTRAVENOUS

## 2013-09-08 MED ORDER — FENTANYL CITRATE 0.05 MG/ML IJ SOLN
INTRAMUSCULAR | Status: AC
  Filled 2013-09-08: qty 5

## 2013-09-08 MED ORDER — LACTATED RINGERS IV SOLN
INTRAVENOUS | Status: DC
  Administered 2013-09-08 (×3): via INTRAVENOUS

## 2013-09-08 MED ORDER — DIPHENHYDRAMINE HCL 50 MG/ML IJ SOLN
12.5000 mg | Freq: Once | INTRAMUSCULAR | Status: AC
  Administered 2013-09-08: 12.5 mg via INTRAVENOUS

## 2013-09-08 MED ORDER — PROPOFOL 10 MG/ML IV BOLUS
INTRAVENOUS | Status: AC
  Filled 2013-09-08: qty 20

## 2013-09-08 MED ORDER — LIDOCAINE HCL (CARDIAC) 10 MG/ML IV SOLN
INTRAVENOUS | Status: DC | PRN
  Administered 2013-09-08: 60 mg via INTRAVENOUS

## 2013-09-08 MED ORDER — ONDANSETRON HCL 4 MG/2ML IJ SOLN
INTRAMUSCULAR | Status: DC | PRN
  Administered 2013-09-08: 4 mg via INTRAVENOUS

## 2013-09-08 MED ORDER — ONDANSETRON HCL 4 MG/2ML IJ SOLN: 4.0000 mg | Freq: Once | INTRAMUSCULAR | Status: DC | PRN

## 2013-09-08 MED ORDER — DIPHENHYDRAMINE HCL 50 MG/ML IJ SOLN
INTRAMUSCULAR | Status: AC
  Filled 2013-09-08: qty 1

## 2013-09-08 MED ORDER — DIPHENHYDRAMINE HCL 50 MG/ML IJ SOLN: 6.2500 mg | Freq: Four times a day (QID) | INTRAMUSCULAR | Status: DC | PRN

## 2013-09-08 MED ORDER — FENTANYL CITRATE 0.05 MG/ML IJ SOLN
INTRAMUSCULAR | Status: DC | PRN
  Administered 2013-09-08: 50 ug via INTRAVENOUS
  Administered 2013-09-08: 100 ug via INTRAVENOUS
  Administered 2013-09-08 (×3): 50 ug via INTRAVENOUS
  Administered 2013-09-08 (×2): 100 ug via INTRAVENOUS

## 2013-09-08 MED ORDER — MIDAZOLAM HCL 2 MG/2ML IJ SOLN
INTRAMUSCULAR | Status: AC
  Filled 2013-09-08: qty 2

## 2013-09-08 SURGICAL SUPPLY — 52 items
BANDAGE ESMARK 6X9 LF (GAUZE/BANDAGES/DRESSINGS) IMPLANT
BNDG CMPR 9X4 STRL LF SNTH (GAUZE/BANDAGES/DRESSINGS) ×1
BNDG CMPR 9X6 STRL LF SNTH (GAUZE/BANDAGES/DRESSINGS)
BNDG COHESIVE 4X5 TAN STRL (GAUZE/BANDAGES/DRESSINGS) ×1 IMPLANT
BNDG ESMARK 4X9 LF (GAUZE/BANDAGES/DRESSINGS) ×1 IMPLANT
BNDG ESMARK 6X9 LF (GAUZE/BANDAGES/DRESSINGS)
BNDG GAUZE STRTCH 6 (GAUZE/BANDAGES/DRESSINGS) ×1 IMPLANT
COVER SURGICAL LIGHT HANDLE (MISCELLANEOUS) ×2 IMPLANT
CUFF TOURNIQUET SINGLE 34IN LL (TOURNIQUET CUFF) IMPLANT
CUFF TOURNIQUET SINGLE 44IN (TOURNIQUET CUFF) IMPLANT
DRAPE C-ARM MINI 42X72 WSTRAPS (DRAPES) IMPLANT
DRAPE INCISE IOBAN 66X45 STRL (DRAPES) ×2 IMPLANT
DRAPE OEC MINIVIEW 54X84 (DRAPES) ×1 IMPLANT
DRAPE PROXIMA HALF (DRAPES) ×2 IMPLANT
DRAPE U-SHAPE 47X51 STRL (DRAPES) ×2 IMPLANT
DRSG ADAPTIC 3X8 NADH LF (GAUZE/BANDAGES/DRESSINGS) ×2 IMPLANT
DURAPREP 26ML APPLICATOR (WOUND CARE) ×2 IMPLANT
ELECT REM PT RETURN 9FT ADLT (ELECTROSURGICAL) ×2
ELECTRODE REM PT RTRN 9FT ADLT (ELECTROSURGICAL) ×1 IMPLANT
GLOVE BIO SURGEON STRL SZ 6.5 (GLOVE) ×1 IMPLANT
GLOVE BIOGEL PI IND STRL 7.0 (GLOVE) IMPLANT
GLOVE BIOGEL PI IND STRL 9 (GLOVE) ×1 IMPLANT
GLOVE BIOGEL PI INDICATOR 7.0 (GLOVE) ×2
GLOVE BIOGEL PI INDICATOR 9 (GLOVE) ×1
GLOVE SURG ORTHO 9.0 STRL STRW (GLOVE) ×2 IMPLANT
GLOVE SURG SS PI 7.0 STRL IVOR (GLOVE) ×1 IMPLANT
GOWN STRL REUS W/ TWL LRG LVL3 (GOWN DISPOSABLE) IMPLANT
GOWN STRL REUS W/ TWL XL LVL3 (GOWN DISPOSABLE) IMPLANT
GOWN STRL REUS W/TWL LRG LVL3 (GOWN DISPOSABLE) ×4
GOWN STRL REUS W/TWL XL LVL3 (GOWN DISPOSABLE) ×4
KIT BASIN OR (CUSTOM PROCEDURE TRAY) ×2 IMPLANT
KIT ROOM TURNOVER OR (KITS) ×2 IMPLANT
MANIFOLD NEPTUNE II (INSTRUMENTS) ×1 IMPLANT
NS IRRIG 1000ML POUR BTL (IV SOLUTION) ×2 IMPLANT
PACK ORTHO EXTREMITY (CUSTOM PROCEDURE TRAY) ×2 IMPLANT
PAD ABD 8X10 STRL (GAUZE/BANDAGES/DRESSINGS) ×1 IMPLANT
PAD ARMBOARD 7.5X6 YLW CONV (MISCELLANEOUS) ×4 IMPLANT
PADDING CAST ABS 6INX4YD NS (CAST SUPPLIES) ×1
PADDING CAST ABS COTTON 6X4 NS (CAST SUPPLIES) IMPLANT
PADDING CAST COTTON 6X4 STRL (CAST SUPPLIES) ×1 IMPLANT
REPAIR TROPE KNTLS SS SYNDESMO (Orthopedic Implant) ×4 IMPLANT
SPONGE GAUZE 4X4 12PLY (GAUZE/BANDAGES/DRESSINGS) ×2 IMPLANT
SPONGE LAP 18X18 X RAY DECT (DISPOSABLE) ×2 IMPLANT
STAPLER VISISTAT 35W (STAPLE) ×1 IMPLANT
SUCTION FRAZIER TIP 10 FR DISP (SUCTIONS) ×2 IMPLANT
SUT ETHILON 2 0 FS 18 (SUTURE) ×1 IMPLANT
SUT ETHILON 2 0 PSLX (SUTURE) ×1 IMPLANT
SUT VIC AB 2-0 CTB1 (SUTURE) ×3 IMPLANT
TOWEL OR 17X24 6PK STRL BLUE (TOWEL DISPOSABLE) ×2 IMPLANT
TOWEL OR 17X26 10 PK STRL BLUE (TOWEL DISPOSABLE) ×2 IMPLANT
TUBE CONNECTING 12X1/4 (SUCTIONS) ×2 IMPLANT
WATER STERILE IRR 1000ML POUR (IV SOLUTION) ×1 IMPLANT

## 2013-09-08 NOTE — Transfer of Care (Signed)
Immediate Anesthesia Transfer of Care Note  Patient: Jonathan Yu  Procedure(s) Performed: Procedure(s): REPAIR SYNDESMOSIS DISRUPTION RIGHT ANKLE (Right)  Patient Location: PACU  Anesthesia Type:General  Level of Consciousness: awake, alert , oriented and patient cooperative  Airway & Oxygen Therapy: Patient Spontanous Breathing and Patient connected to nasal cannula oxygen  Post-op Assessment: Report given to PACU RN, Post -op Vital signs reviewed and stable and Patient moving all extremities  Post vital signs: Reviewed and stable  Complications: No apparent anesthesia complications

## 2013-09-08 NOTE — Op Note (Signed)
OPERATIVE REPORT  DATE OF SURGERY: 09/08/2013  PATIENT:  Jonathan Yu,  63 y.o. male  PRE-OPERATIVE DIAGNOSIS:  Right syndesmosis disruption  POST-OPERATIVE DIAGNOSIS:  right syndesmosis disruption  PROCEDURE:  Procedure(s): REPAIR SYNDESMOSIS DISRUPTION RIGHT ANKLE  SURGEON:  Surgeon(s): Newt Minion, MD  ANESTHESIA:   general  EBL:  min ML  SPECIMEN:  No Specimen  TOURNIQUET:  * No tourniquets in log *  PROCEDURE DETAILS: Patient is a 63 year old gentleman who presents for reconstruction of the syndesmosis right ankle. Patient is 2 weeks status post total ankle replacement. Patient has had progressive disruption of the syndesmosis and presents at this time for reconstruction. Risks and benefits were discussed including infection neurovascular injury recurrent disruption need for additional surgery. Patient states he understands and wished to proceed at this time. Description of procedure patient was brought to the operating room underwent a general anesthetic. After adequate levels of anesthesia were obtained patient's right lower extremity was first prepped using a knife scrub dried and then prepped using DuraPrep and draped into a sterile field. The previous lateral incision was used this was carried down to the fibular plate and percutaneous incision was made medially and a cane tong was used to repair the syndesmosis. C-arm fluoroscopy was used to verify reduction of the syndesmosis. Using the tight rope technique the tight rope was passed through the lateral plate to the medial cortex C-arm fluoroscopy verified reduction in the tight rope was pulled down and tightened. Too tight rope sutures were left in place. Too tight rope sutures failed to the mid portal and pulled through the medial cortex of the bone. These anchors of the tight rope were left in place. The wound was irrigated with normal saline the incisions were closed using 2-0 nylon. The wound was covered Adaptic  orthopedic sponges AB dressing web roll and Coban. Patient was extubated taken to the PACU in stable condition plan for discharge to home.  PLAN OF CARE: Discharge to home after PACU  PATIENT DISPOSITION:  PACU - hemodynamically stable.   Newt Minion, MD 09/08/2013 2:07 PM

## 2013-09-08 NOTE — Anesthesia Preprocedure Evaluation (Signed)
Anesthesia Evaluation  Patient identified by MRN, date of birth, ID band Patient awake    Reviewed: Allergy & Precautions, H&P , NPO status , Patient's Chart, lab work & pertinent test results  Airway       Dental   Pulmonary sleep apnea , Current Smoker,          Cardiovascular hypertension,     Neuro/Psych  Neuromuscular disease    GI/Hepatic   Endo/Other  diabetes, Type 2  Renal/GU Renal disease     Musculoskeletal   Abdominal   Peds  Hematology   Anesthesia Other Findings   Reproductive/Obstetrics                           Anesthesia Physical Anesthesia Plan  ASA: III  Anesthesia Plan: General   Post-op Pain Management:    Induction:   Airway Management Planned: LMA and Oral ETT  Additional Equipment:   Intra-op Plan:   Post-operative Plan: Extubation in OR  Informed Consent: I have reviewed the patients History and Physical, chart, labs and discussed the procedure including the risks, benefits and alternatives for the proposed anesthesia with the patient or authorized representative who has indicated his/her understanding and acceptance.     Plan Discussed with:   Anesthesia Plan Comments:         Anesthesia Quick Evaluation

## 2013-09-08 NOTE — H&P (Signed)
Jamahl Lemmons is an 63 y.o. male.   Chief Complaint: Widening of the syndesmosis right ankle status post total ankle arthroplasty HPI: Patient is a 63 year old gentleman who presents status post total ankle arthroplasty. Patient's initial radiographs showed a congruent ankle however this time patient has widening of the syndesmosis with a non-congruent ankle joint.  Past Medical History  Diagnosis Date  . Allergy   . Hyperlipidemia   . DDD (degenerative disc disease), lumbar   . Hypertension 2012    prior to weight loss  . OSA on CPAP     CPAP  . Diabetes mellitus     no meds  . Type II or unspecified type diabetes mellitus without mention of complication, not stated as uncontrolled 01/24/2011    Pneumovax given 06-2009     Past Surgical History  Procedure Laterality Date  . Rtc repair    . Ureteral stent    . Ankle arthrodesis    . Total ankle arthroplasty Right 08/18/2013    Procedure: TOTAL ANKLE ARTHOPLASTY;  Surgeon: Newt Minion, MD;  Location: [facility];  Service: Orthopedics;  Laterality: Right;  Right Total Ankle Arthroplasty, Revision Fibular Fracture  . Nasal septum surgery    . Ankle surgery      post fracture  . Rotator cuff repair Right     x 2    Family History  Problem Relation Age of Onset  . Cancer Mother 48    Lung   Social History:  reports that he has been smoking Pipe and Cigars.  He does not have any smokeless tobacco history on file. He reports that he drinks alcohol. He reports that he does not use illicit drugs.  Allergies:  Allergies  Allergen Reactions  . Sesame Oil Hives and Shortness Of Breath  . Percocet [Oxycodone-Acetaminophen] Rash  . Vicodin [Hydrocodone-Acetaminophen] Rash    No prescriptions prior to admission    No results found for this or any previous visit (from the past 48 hour(s)). No results found.  Review of Systems  All other systems reviewed and are negative.    There were no vitals taken for this visit. Physical  Exam  On examination the incisions are well-healed there is no redness no cellulitis no signs of infection. Radiographs shows a widening of the syndesmosis with a non-congruent ankle joint. Assessment/Plan Syndesmosis widening with a non-congruent ankle joint status post total ankle arthroplasty.  Plan: Will plan for reconstruction of the syndesmosis with the tight rope construct. Risks and benefits were discussed including infection neurovascular injury failure of fixation. Patient states he understands and wished to proceed at this time.  Journi Moffa V 09/08/2013, 6:48 AM

## 2013-09-08 NOTE — Anesthesia Postprocedure Evaluation (Signed)
  Anesthesia Post-op Note  Patient: Jonathan Yu  Procedure(s) Performed: Procedure(s): REPAIR SYNDESMOSIS DISRUPTION RIGHT ANKLE (Right)  Patient Location: PACU  Anesthesia Type:General  Level of Consciousness: awake, alert , oriented and patient cooperative  Airway and Oxygen Therapy: Patient Spontanous Breathing  Post-op Pain: moderate  Post-op Assessment: Post-op Vital signs reviewed, Patient's Cardiovascular Status Stable, Respiratory Function Stable, Patent Airway, No signs of Nausea or vomiting and Pain level controlled  Post-op Vital Signs: stable  Complications: No apparent anesthesia complications

## 2013-09-09 ENCOUNTER — Encounter (HOSPITAL_COMMUNITY): Payer: Self-pay | Admitting: Orthopedic Surgery

## 2013-09-21 ENCOUNTER — Telehealth: Payer: Self-pay | Admitting: Family Medicine

## 2013-09-21 NOTE — Telephone Encounter (Signed)
OK with me but will defer decision to Dr. Jerilynn Mages.

## 2013-09-21 NOTE — Telephone Encounter (Signed)
Pt called to schedule an appt and he wants to switch from Dr. Madilyn Fireman to Dr. Ileene Rubens.

## 2013-09-22 NOTE — Telephone Encounter (Signed)
Ok with me as well  

## 2013-09-23 ENCOUNTER — Encounter: Payer: Self-pay | Admitting: Family Medicine

## 2013-09-23 ENCOUNTER — Ambulatory Visit (INDEPENDENT_AMBULATORY_CARE_PROVIDER_SITE_OTHER): Payer: 59 | Admitting: Family Medicine

## 2013-09-23 VITALS — BP 131/86 | HR 76 | Ht 66.5 in | Wt 241.0 lb

## 2013-09-23 DIAGNOSIS — E785 Hyperlipidemia, unspecified: Secondary | ICD-10-CM

## 2013-09-23 DIAGNOSIS — Z13220 Encounter for screening for lipoid disorders: Secondary | ICD-10-CM

## 2013-09-23 DIAGNOSIS — E669 Obesity, unspecified: Secondary | ICD-10-CM

## 2013-09-23 DIAGNOSIS — E349 Endocrine disorder, unspecified: Secondary | ICD-10-CM

## 2013-09-23 DIAGNOSIS — E291 Testicular hypofunction: Secondary | ICD-10-CM

## 2013-09-23 DIAGNOSIS — I10 Essential (primary) hypertension: Secondary | ICD-10-CM

## 2013-09-23 DIAGNOSIS — Z0000 Encounter for general adult medical examination without abnormal findings: Secondary | ICD-10-CM

## 2013-09-23 DIAGNOSIS — Z79899 Other long term (current) drug therapy: Secondary | ICD-10-CM

## 2013-09-23 MED ORDER — LORCASERIN HCL 10 MG PO TABS: ORAL_TABLET | ORAL | Status: DC

## 2013-09-23 NOTE — Patient Instructions (Signed)
Dr. Avni Traore's General Advice Following Your Complete Physical Exam  The Benefits of Regular Exercise: Unless you suffer from an uncontrolled cardiovascular condition, studies strongly suggest that regular exercise and physical activity will add to both the quality and length of your life.  The World Health Organization recommends 150 minutes of moderate intensity aerobic activity every week.  This is best split over 3-4 days a week, and can be as simple as a brisk walk for just over 35 minutes "most days of the week".  This type of exercise has been shown to lower LDL-Cholesterol, lower average blood sugars, lower blood pressure, lower cardiovascular disease risk, improve memory, and increase one's overall sense of wellbeing.  The addition of anaerobic (or "strength training") exercises offers additional benefits including but not limited to increased metabolism, prevention of osteoporosis, and improved overall cholesterol levels.  How Can I Strive For A Low-Fat Diet?: Current guidelines recommend that 25-35 percent of your daily energy (food) intake should come from fats.  One might ask how can this be achieved without having to dissect each meal on a daily basis?  Switch to skim or 1% milk instead of whole milk.  Focus on lean meats such as ground turkey, fresh fish, baked chicken, and lean cuts of beef as your source of dietary protein.  Consume less than 300mg/day of dietary cholesterol.  Limit trans fatty acid consumption primarily by limiting synthetic trans fats such as partially hydrogenated oils (Ex: fried fast foods).  Focus efforts on reducing your intake of "solid" fats (Ex: Butter).  Substitute olive or vegetable oil for solid fats where possible.  Moderation of Salt Intake: Provided you don't carry a diagnosis of congestive heart failure nor renal failure, I recommend a daily allowance of no more than 2300 mg of salt (sodium).  Keeping under this daily goal is associated with a  decreased risk of cardiovascular events, creeping above it can lead to elevated blood pressures and increases your risk of cardiovascular events.  Milligrams (mg) of salt is listed on all nutrition labels, and your daily intake can add up faster than you think.  Most canned and frozen dinners can pack in over half your daily salt allowance in one meal.    Lifestyle Health Risks: Certain lifestyle choices carry specific health risks.  As you may already know, tobacco use has been associated with increasing one's risk of cardiovascular disease, pulmonary disease, numerous cancers, among many other issues.  What you may not know is that there are medications and nicotine replacement strategies that can more than double your chances of successfully quitting.  I would be thrilled to help manage your quitting strategy if you currently use tobacco products.  When it comes to alcohol use, I've yet to find an "ideal" daily allowance.  Provided an individual does not have a medical condition that is exacerbated by alcohol consumption, general guidelines determine "safe drinking" as no more than two standard drinks for a man or no more than one standard drink for a male per day.  However, much debate still exists on whether any amount of alcohol consumption is technically "safe".  My general advice, keep alcohol consumption to a minimum for general health promotion.  If you or others believe that alcohol, tobacco, or recreational drug use is interfering with your life, I would be happy to provide confidential counseling regarding treatment options.  General "Over The Counter" Nutrition Advice: Postmenopausal women should aim for a daily calcium intake of 1200 mg, however a significant   portion of this might already be provided by diets including milk, yogurt, cheese, and other dairy products.  Vitamin D has been shown to help preserve bone density, prevent fatigue, and has even been shown to help reduce falls in the  elderly.  Ensuring a daily intake of 800 Units of Vitamin D is a good place to start to enjoy the above benefits, we can easily check your Vitamin D level to see if you'd potentially benefit from supplementation beyond 800 Units a day.  Folic Acid intake should be of particular concern to women of childbearing age.  Daily consumption of 400-800 mcg of Folic Acid is recommended to minimize the chance of spinal cord defects in a fetus should pregnancy occur.    For many adults, accidents still remain one of the most common culprits when it comes to cause of death.  Some of the simplest but most effective preventitive habits you can adopt include regular seatbelt use, proper helmet use, securing firearms, and regularly testing your smoke and carbon monoxide detectors.  Jaikob Borgwardt B. Ilias Stcharles DO [facility] [address], [address] [place], [place] [address] Phone: [phone]   Testicular Self-Exam A self-examination of your testicles involves looking at and feeling your testicles for abnormal lumps or swelling. Several things can cause swelling, lumps, or pain in your testicles. Some of these causes are:  Injuries.  Inflammation.  Infection.  Accumulation of fluids around your testicle (hydrocele).  Twisted testicles (testicular torsion).  Testicular cancer. Self-examination of the testicles and groin areas may be advised if you are at risk for testicular cancer. Risks for testicular cancer include:  An undescended testicle (cryptorchidism).  A history of previous testicular cancer.  A family history of testicular cancer. The testicles are easiest to examine after warm baths or showers and are more difficult to examine when you are cold. This is because the muscles attached to the testicles retract and pull them up higher or into the abdomen. Follow these steps while you are standing:  Hold your penis away from your body.  Roll one testicle between your thumb and forefinger,  feeling the entire testicle.  Roll the other testicle between your thumb and forefinger, feeling the entire testicle. Feel for lumps, swelling, or discomfort. A normal testicle is egg shaped and feels firm. It is smooth and not tender. The spermatic cord can be felt as a firm spaghetti-like cord at the back of your testicle. It is also important to examine the crease between the front of your leg and your abdomen. Feel for any bumps that are tender. These could be enlarged lymph nodes.  Document Released: 11/04/2000 Document Revised: 03/31/2013 Document Reviewed: 01/18/2013 [employer] Patient Information 2014 [employer].  

## 2013-09-23 NOTE — Progress Notes (Signed)
CC: Jonathan Yu is a 63 y.o. male is here for Annual Exam   Subjective: HPI:  Colonoscopy:~ 2011 no polpys and was given tender clearance Prostate: Discussed screening risks/beneifts with patient today. PSA of 1.24 08/2011 consistently below 4 at [facility] urology, most recent check in October of 2014  Influenza Vaccine: [facility] in 2014 Pneumovax: 2010 Td/Tdap: 2012  His only acute complaint today is difficulty with losing weight complicated by recent right ankle fracture. Symptoms are complicated by constant hunger and cravings for food and snacking. No formal exercise routine are now due to injury. Has been battling weight for years.  Rare alcohol use no tobacco or recreational drug use  Review of Systems - General ROS: negative for - chills, fever, night sweats,  weight loss Ophthalmic ROS: negative for - decreased vision Psychological ROS: negative for - anxiety or depression ENT ROS: negative for - hearing change, nasal congestion, tinnitus or allergies Hematological and Lymphatic ROS: negative for - bleeding problems, bruising or swollen lymph nodes Breast ROS: negative Respiratory ROS: no cough, shortness of breath, or wheezing Cardiovascular ROS: no chest pain or dyspnea on exertion Gastrointestinal ROS: no abdominal pain, change in bowel habits, or black or bloody stools Genito-Urinary ROS: negative for - genital discharge, genital ulcers, incontinence or abnormal bleeding from genitals Musculoskeletal ROS: negative for - joint pain or muscle pain Neurological ROS: negative for - headaches or memory loss Dermatological ROS: negative for lumps, mole changes, rash and skin lesion changes  Past Medical History  Diagnosis Date  . Allergy   . Hyperlipidemia   . DDD (degenerative disc disease), lumbar   . Hypertension 2012    prior to weight loss  . OSA on CPAP     CPAP  . Diabetes mellitus     no meds  . Type II or unspecified type diabetes mellitus without mention of  complication, not stated as uncontrolled 01/24/2011    Pneumovax given 06-2009     Past Surgical History  Procedure Laterality Date  . Rtc repair    . Ureteral stent    . Ankle arthrodesis    . Total ankle arthroplasty Right 08/18/2013    Procedure: TOTAL ANKLE ARTHOPLASTY;  Surgeon: Newt Minion, MD;  Location: [place];  Service: Orthopedics;  Laterality: Right;  Right Total Ankle Arthroplasty, Revision Fibular Fracture  . Nasal septum surgery    . Ankle surgery      post fracture  . Rotator cuff repair Right     x 2  . Orif ankle fracture Right 09/08/2013    Procedure: REPAIR SYNDESMOSIS DISRUPTION RIGHT ANKLE;  Surgeon: Newt Minion, MD;  Location: [place];  Service: Orthopedics;  Laterality: Right;   Family History  Problem Relation Age of Onset  . Cancer Mother 28    Lung    History   Social History  . Marital Status: Divorced    Spouse Name: N/A    Number of Children: 51  . Years of Education: N/A   Occupational History  . sales/ DJ    Social History Main Topics  . Smoking status: Current Some Day Smoker    Types: Pipe, Cigars  . Smokeless tobacco: Not on file  . Alcohol Use: Yes     Comment: ocassional  . Drug Use: No  . Sexual Activity: Yes     Comment: 1 every 2 months   Other Topics Concern  . Not on file   Social History Narrative  . No narrative on file  Objective: BP 131/86  Pulse 76  Ht 5' 6.5" (1.689 m)  Wt 241 lb (109.317 kg)  BMI 38.32 kg/m2  General: No Acute Distress HEENT: Atraumatic, normocephalic, conjunctivae normal without scleral icterus.  No nasal discharge, hearing grossly intact, TMs with good landmarks bilaterally with no middle ear abnormalities, posterior pharynx clear without oral lesions. Neck: Supple, trachea midline, no cervical nor supraclavicular adenopathy. Pulmonary: Clear to auscultation bilaterally without wheezing, rhonchi, nor rales. Cardiac: Regular rate and rhythm.  No murmurs, rubs, nor gallops. No peripheral  edema.  2+ peripheral pulses bilaterally. Abdomen: Bowel sounds normal.  No masses.  Non-tender without rebound.  Negative Murphy's sign. GU: Bilateral descended testes with no inguinal hernia MSK: Grossly intact, no signs of weakness.  Full strength throughout upper and lower extremities.  Full ROM in upper and lower extremities.  No midline spinal tenderness. Neuro: Gait unremarkable, CN II-XII grossly intact.  C5-C6 Reflex 2/4 Bilaterally, L4 Reflex 2/4 Bilaterally.  Cerebellar function intact. Skin: No rashes. Psych: Alert and oriented to person/place/time.  Thought process normal. No anxiety/depression.  Assessment & Plan: Jonathan Yu was seen today for annual exam.  Diagnoses and associated orders for this visit:  Annual physical exam  Essential hypertension, benign - BASIC METABOLIC PANEL WITH GFR  Testosterone deficiency - CBC  High risk medication use - CBC  Lipid screening - Lipid panel  Dyslipidemia  Other Orders - Discontinue: Lorcaserin HCl 10 MG TABS; One by mouth twice a day. (Two week starter/sample) - Lorcaserin HCl 10 MG TABS; One by mouth twice a day - Lorcaserin HCl 10 MG TABS; One by mouth twice a day. (Starter Pack)    Obesity: Uncontrolled, Start belviq Dyslipidemia: Repeat routine lipid screening/panel Testosterone supplementation for hypogonadism: Checking hemoglobin Essential hypertension: Controlled checking renal function Healthy lifestyle interventions including but not limited to regular exercise, a healthy low fat diet, moderation of salt intake, the dangers of tobacco/alcohol/recreational drug use, nutrition supplementation, and accident avoidance were discussed with the patient and a handout was provided for future reference.  Return in about 3 months (around 12/21/2013) for Weight check.

## 2013-09-24 ENCOUNTER — Encounter: Payer: Self-pay | Admitting: Family Medicine

## 2013-09-24 LAB — CBC
HCT: 42 % (ref 39.0–52.0)
Hemoglobin: 14.2 g/dL (ref 13.0–17.0)
MCH: 29.6 pg (ref 26.0–34.0)
MCHC: 33.8 g/dL (ref 30.0–36.0)
MCV: 87.5 fL (ref 78.0–100.0)
Platelets: 274 10*3/uL (ref 150–400)
RBC: 4.8 MIL/uL (ref 4.22–5.81)
RDW: 13.8 % (ref 11.5–15.5)
WBC: 7.9 10*3/uL (ref 4.0–10.5)

## 2013-09-24 LAB — BASIC METABOLIC PANEL WITH GFR
BUN: 17 mg/dL (ref 6–23)
CO2: 28 mEq/L (ref 19–32)
Calcium: 9.7 mg/dL (ref 8.4–10.5)
Chloride: 103 mEq/L (ref 96–112)
Creat: 0.88 mg/dL (ref 0.50–1.35)
GFR, Est African American: >89 mL/min
GFR, Est Non African American: >89 mL/min
Glucose, Bld: 96 mg/dL (ref 70–99)
Potassium: 4.3 mEq/L (ref 3.5–5.3)
Sodium: 139 mEq/L (ref 135–145)

## 2013-09-24 LAB — LIPID PANEL
Cholesterol: 191 mg/dL (ref 0–200)
HDL: 45 mg/dL (ref >39)
LDL Cholesterol: 120 mg/dL — ABNORMAL HIGH (ref 0–99)
Total CHOL/HDL Ratio: 4.2 Ratio
Triglycerides: 129 mg/dL (ref <150)
VLDL: 26 mg/dL (ref 0–40)

## 2013-10-06 ENCOUNTER — Other Ambulatory Visit (HOSPITAL_COMMUNITY): Payer: Self-pay | Admitting: Orthopedic Surgery

## 2013-10-06 ENCOUNTER — Inpatient Hospital Stay: Admit: 2013-10-06 | Payer: Self-pay

## 2013-10-06 ENCOUNTER — Inpatient Hospital Stay (HOSPITAL_COMMUNITY)
Admission: AD | Admit: 2013-10-06 | Discharge: 2013-10-09 | DRG: 497 | Disposition: A | Discharge status: Home / Self Care | Payer: 59 | Source: Ambulatory Visit | Attending: Orthopedic Surgery | Admitting: Orthopedic Surgery

## 2013-10-06 ENCOUNTER — Encounter (HOSPITAL_COMMUNITY): Payer: Self-pay | Admitting: General Practice

## 2013-10-06 DIAGNOSIS — E785 Hyperlipidemia, unspecified: Secondary | ICD-10-CM | POA: Diagnosis present

## 2013-10-06 DIAGNOSIS — I10 Essential (primary) hypertension: Secondary | ICD-10-CM | POA: Diagnosis present

## 2013-10-06 DIAGNOSIS — E119 Type 2 diabetes mellitus without complications: Secondary | ICD-10-CM | POA: Diagnosis present

## 2013-10-06 DIAGNOSIS — Z79899 Other long term (current) drug therapy: Secondary | ICD-10-CM

## 2013-10-06 DIAGNOSIS — Y831 Surgical operation with implant of artificial internal device as the cause of abnormal reaction of the patient, or of later complication, without mention of misadventure at the time of the procedure: Secondary | ICD-10-CM | POA: Diagnosis present

## 2013-10-06 DIAGNOSIS — T847XXA Infection and inflammatory reaction due to other internal orthopedic prosthetic devices, implants and grafts, initial encounter: Principal | ICD-10-CM | POA: Diagnosis present

## 2013-10-06 DIAGNOSIS — G4733 Obstructive sleep apnea (adult) (pediatric): Secondary | ICD-10-CM | POA: Diagnosis present

## 2013-10-06 DIAGNOSIS — F17200 Nicotine dependence, unspecified, uncomplicated: Secondary | ICD-10-CM | POA: Diagnosis present

## 2013-10-06 HISTORY — DX: Pneumonia, unspecified organism: J18.9

## 2013-10-06 HISTORY — DX: Type 2 diabetes mellitus without complications: E11.9

## 2013-10-06 MED ORDER — VANCOMYCIN HCL IN DEXTROSE 1-5 GM/200ML-% IV SOLN
1000.0000 mg | Freq: Two times a day (BID) | INTRAVENOUS | Status: DC
  Administered 2013-10-06 – 2013-10-09 (×6): 1000 mg via INTRAVENOUS
  Filled 2013-10-06 (×7): qty 200

## 2013-10-06 MED ORDER — PIPERACILLIN-TAZOBACTAM 3.375 G IVPB
3.3750 g | Freq: Four times a day (QID) | INTRAVENOUS | Status: DC
  Administered 2013-10-06: 3.375 g via INTRAVENOUS
  Filled 2013-10-06 (×4): qty 50

## 2013-10-06 MED ORDER — PIPERACILLIN-TAZOBACTAM 3.375 G IVPB
3.3750 g | Freq: Three times a day (TID) | INTRAVENOUS | Status: DC
  Administered 2013-10-07 – 2013-10-09 (×7): 3.375 g via INTRAVENOUS
  Filled 2013-10-06 (×9): qty 50

## 2013-10-06 MED ORDER — PIPERACILLIN-TAZOBACTAM 3.375 G IVPB
3.3750 g | Freq: Four times a day (QID) | INTRAVENOUS | Status: DC
  Filled 2013-10-06 (×3): qty 50

## 2013-10-06 MED ORDER — VANCOMYCIN HCL IN DEXTROSE 1-5 GM/200ML-% IV SOLN: 1000.0000 mg | Freq: Two times a day (BID) | INTRAVENOUS | Status: DC

## 2013-10-06 MED ORDER — CHLORHEXIDINE GLUCONATE 4 % EX LIQD
60.0000 mL | Freq: Once | CUTANEOUS | Status: DC
  Filled 2013-10-06: qty 60

## 2013-10-06 MED ORDER — SODIUM CHLORIDE 0.9 % IV SOLN: INTRAVENOUS | Status: DC

## 2013-10-07 ENCOUNTER — Encounter (HOSPITAL_COMMUNITY)
Admission: AD | Disposition: A | Discharge status: Home / Self Care | Payer: Self-pay | Source: Ambulatory Visit | Attending: Orthopedic Surgery

## 2013-10-07 ENCOUNTER — Inpatient Hospital Stay (HOSPITAL_COMMUNITY): Payer: 59 | Admitting: Certified Registered"

## 2013-10-07 ENCOUNTER — Encounter (HOSPITAL_COMMUNITY): Payer: Self-pay | Admitting: Certified Registered"

## 2013-10-07 ENCOUNTER — Encounter (HOSPITAL_COMMUNITY): Payer: 59 | Admitting: Certified Registered"

## 2013-10-07 ENCOUNTER — Inpatient Hospital Stay (HOSPITAL_COMMUNITY): Admission: RE | Admit: 2013-10-07 | Payer: 59 | Source: Ambulatory Visit | Admitting: Orthopedic Surgery

## 2013-10-07 HISTORY — PX: HARDWARE REMOVAL: SHX979

## 2013-10-07 LAB — SURGICAL PCR SCREEN
MRSA, PCR: NEGATIVE
Staphylococcus aureus: NEGATIVE

## 2013-10-07 LAB — GLUCOSE, CAPILLARY: Glucose-Capillary: 94 mg/dL (ref 70–99)

## 2013-10-07 SURGERY — REMOVAL, HARDWARE
Anesthesia: General | Site: Ankle | Laterality: Right

## 2013-10-07 MED ORDER — VANCOMYCIN HCL 500 MG IV SOLR
INTRAVENOUS | Status: AC
  Filled 2013-10-07: qty 500

## 2013-10-07 MED ORDER — PROPOFOL 10 MG/ML IV BOLUS
INTRAVENOUS | Status: DC | PRN
  Administered 2013-10-07: 150 mg via INTRAVENOUS

## 2013-10-07 MED ORDER — LIDOCAINE HCL (CARDIAC) 20 MG/ML IV SOLN
INTRAVENOUS | Status: AC
  Filled 2013-10-07: qty 5

## 2013-10-07 MED ORDER — OXYCODONE HCL 5 MG/5ML PO SOLN: 5.0000 mg | Freq: Once | ORAL | Status: DC | PRN

## 2013-10-07 MED ORDER — SODIUM CHLORIDE 0.9 % IR SOLN
Status: DC | PRN
  Administered 2013-10-07 (×2): 1000 mL

## 2013-10-07 MED ORDER — MEPERIDINE HCL 25 MG/ML IJ SOLN
INTRAMUSCULAR | Status: AC
  Filled 2013-10-07: qty 1

## 2013-10-07 MED ORDER — HYDROMORPHONE HCL PF 1 MG/ML IJ SOLN
0.2500 mg | INTRAMUSCULAR | Status: DC | PRN
  Administered 2013-10-07 (×4): 0.5 mg via INTRAVENOUS

## 2013-10-07 MED ORDER — OXYCODONE HCL 5 MG PO TABS: 5.0000 mg | ORAL_TABLET | Freq: Once | ORAL | Status: DC | PRN

## 2013-10-07 MED ORDER — ONDANSETRON HCL 4 MG/2ML IJ SOLN: 4.0000 mg | Freq: Four times a day (QID) | INTRAMUSCULAR | Status: DC | PRN

## 2013-10-07 MED ORDER — SODIUM CHLORIDE 0.9 % IV SOLN: INTRAVENOUS | Status: DC

## 2013-10-07 MED ORDER — LIDOCAINE HCL (CARDIAC) 20 MG/ML IV SOLN
INTRAVENOUS | Status: DC | PRN
  Administered 2013-10-07: 100 mg via INTRAVENOUS

## 2013-10-07 MED ORDER — VANCOMYCIN HCL 500 MG IV SOLR
INTRAVENOUS | Status: DC | PRN
  Administered 2013-10-07: 160 mg

## 2013-10-07 MED ORDER — MIDAZOLAM HCL 2 MG/2ML IJ SOLN
INTRAMUSCULAR | Status: AC
  Filled 2013-10-07: qty 2

## 2013-10-07 MED ORDER — VANCOMYCIN HCL 1000 MG IV SOLR
INTRAVENOUS | Status: AC
  Filled 2013-10-07: qty 1000

## 2013-10-07 MED ORDER — LACTATED RINGERS IV SOLN
INTRAVENOUS | Status: DC
  Administered 2013-10-07: 08:00:00 via INTRAVENOUS

## 2013-10-07 MED ORDER — GENTAMICIN SULFATE 40 MG/ML IJ SOLN
INTRAMUSCULAR | Status: AC
  Filled 2013-10-07: qty 6

## 2013-10-07 MED ORDER — ONDANSETRON HCL 4 MG/2ML IJ SOLN
INTRAMUSCULAR | Status: DC | PRN
  Administered 2013-10-07: 4 mg via INTRAVENOUS

## 2013-10-07 MED ORDER — ONDANSETRON HCL 4 MG/2ML IJ SOLN: 4.0000 mg | Freq: Once | INTRAMUSCULAR | Status: DC | PRN

## 2013-10-07 MED ORDER — 0.9 % SODIUM CHLORIDE (POUR BTL) OPTIME
TOPICAL | Status: DC | PRN
  Administered 2013-10-07: 1000 mL

## 2013-10-07 MED ORDER — HYDROMORPHONE HCL PF 1 MG/ML IJ SOLN
0.5000 mg | INTRAMUSCULAR | Status: DC | PRN
  Administered 2013-10-07 – 2013-10-08 (×4): 1 mg via INTRAVENOUS
  Filled 2013-10-07 (×4): qty 1

## 2013-10-07 MED ORDER — METOCLOPRAMIDE HCL 5 MG/ML IJ SOLN: 5.0000 mg | Freq: Three times a day (TID) | INTRAMUSCULAR | Status: DC | PRN

## 2013-10-07 MED ORDER — MIDAZOLAM HCL 5 MG/5ML IJ SOLN
INTRAMUSCULAR | Status: DC | PRN
  Administered 2013-10-07: 2 mg via INTRAVENOUS

## 2013-10-07 MED ORDER — HYDROMORPHONE HCL PF 1 MG/ML IJ SOLN
INTRAMUSCULAR | Status: AC
  Filled 2013-10-07: qty 1

## 2013-10-07 MED ORDER — FENTANYL CITRATE 0.05 MG/ML IJ SOLN
INTRAMUSCULAR | Status: DC | PRN
  Administered 2013-10-07 (×3): 50 ug via INTRAVENOUS

## 2013-10-07 MED ORDER — METOCLOPRAMIDE HCL 10 MG PO TABS: 5.0000 mg | ORAL_TABLET | Freq: Three times a day (TID) | ORAL | Status: DC | PRN

## 2013-10-07 MED ORDER — GENTAMICIN SULFATE 40 MG/ML IJ SOLN
INTRAMUSCULAR | Status: DC | PRN
  Administered 2013-10-07: 160 mg via INTRAMUSCULAR

## 2013-10-07 MED ORDER — ONDANSETRON HCL 4 MG/2ML IJ SOLN
INTRAMUSCULAR | Status: AC
  Filled 2013-10-07: qty 2

## 2013-10-07 MED ORDER — ONDANSETRON HCL 4 MG PO TABS: 4.0000 mg | ORAL_TABLET | Freq: Four times a day (QID) | ORAL | Status: DC | PRN

## 2013-10-07 MED ORDER — MEPERIDINE HCL 25 MG/ML IJ SOLN
6.2500 mg | INTRAMUSCULAR | Status: DC | PRN
  Administered 2013-10-07: 12.5 mg via INTRAVENOUS

## 2013-10-07 MED ORDER — FENTANYL CITRATE 0.05 MG/ML IJ SOLN
INTRAMUSCULAR | Status: AC
  Filled 2013-10-07: qty 5

## 2013-10-07 MED ORDER — PROPOFOL 10 MG/ML IV BOLUS
INTRAVENOUS | Status: AC
  Filled 2013-10-07: qty 20

## 2013-10-07 SURGICAL SUPPLY — 61 items
BANDAGE ELASTIC 4 VELCRO ST LF (GAUZE/BANDAGES/DRESSINGS) IMPLANT
BANDAGE ELASTIC 6 VELCRO ST LF (GAUZE/BANDAGES/DRESSINGS) IMPLANT
BANDAGE ESMARK 6X9 LF (GAUZE/BANDAGES/DRESSINGS) IMPLANT
BANDAGE GAUZE ELAST BULKY 4 IN (GAUZE/BANDAGES/DRESSINGS) ×1 IMPLANT
BNDG CMPR 9X6 STRL LF SNTH (GAUZE/BANDAGES/DRESSINGS)
BNDG COHESIVE 4X5 TAN STRL (GAUZE/BANDAGES/DRESSINGS) ×1 IMPLANT
BNDG ESMARK 6X9 LF (GAUZE/BANDAGES/DRESSINGS)
CANISTER WOUND CARE 500ML ATS (WOUND CARE) ×1 IMPLANT
COVER SURGICAL LIGHT HANDLE (MISCELLANEOUS) ×2 IMPLANT
CUFF TOURNIQUET SINGLE 34IN LL (TOURNIQUET CUFF) IMPLANT
CUFF TOURNIQUET SINGLE 44IN (TOURNIQUET CUFF) IMPLANT
DRAPE C-ARM 42X72 X-RAY (DRAPES) IMPLANT
DRAPE INCISE IOBAN 66X45 STRL (DRAPES) IMPLANT
DRAPE OEC MINIVIEW 54X84 (DRAPES) ×1 IMPLANT
DRAPE ORTHO SPLIT 77X108 STRL (DRAPES) ×2
DRAPE SURG ORHT 6 SPLT 77X108 (DRAPES) IMPLANT
DRAPE U-SHAPE 47X51 STRL (DRAPES) ×1 IMPLANT
DRSG EMULSION OIL 3X3 NADH (GAUZE/BANDAGES/DRESSINGS) ×1 IMPLANT
DRSG PAD ABDOMINAL 8X10 ST (GAUZE/BANDAGES/DRESSINGS) ×1 IMPLANT
DRSG VAC ATS SM SENSATRAC (GAUZE/BANDAGES/DRESSINGS) ×1 IMPLANT
ELECT CAUTERY BLADE 6.4 (BLADE) ×1 IMPLANT
ELECT REM PT RETURN 9FT ADLT (ELECTROSURGICAL) ×2
ELECTRODE REM PT RTRN 9FT ADLT (ELECTROSURGICAL) ×1 IMPLANT
GLOVE BIOGEL PI IND STRL 6 (GLOVE) IMPLANT
GLOVE BIOGEL PI IND STRL 7.5 (GLOVE) IMPLANT
GLOVE BIOGEL PI IND STRL 9 (GLOVE) ×1 IMPLANT
GLOVE BIOGEL PI INDICATOR 6 (GLOVE) ×1
GLOVE BIOGEL PI INDICATOR 7.5 (GLOVE) ×1
GLOVE BIOGEL PI INDICATOR 9 (GLOVE) ×1
GLOVE SURG ORTHO 9.0 STRL STRW (GLOVE) ×2 IMPLANT
GLOVE SURG SS PI 7.0 STRL IVOR (GLOVE) ×2 IMPLANT
GOWN STRL REUS W/ TWL LRG LVL3 (GOWN DISPOSABLE) IMPLANT
GOWN STRL REUS W/ TWL XL LVL3 (GOWN DISPOSABLE) IMPLANT
GOWN STRL REUS W/TWL LRG LVL3 (GOWN DISPOSABLE) ×2
GOWN STRL REUS W/TWL XL LVL3 (GOWN DISPOSABLE) ×4
HANDPIECE INTERPULSE COAX TIP (DISPOSABLE) ×2
KIT BASIN OR (CUSTOM PROCEDURE TRAY) ×2 IMPLANT
KIT ROOM TURNOVER OR (KITS) ×2 IMPLANT
KIT STIMULAN RAPID CURE 5CC (Orthopedic Implant) ×1 IMPLANT
MANIFOLD NEPTUNE II (INSTRUMENTS) ×2 IMPLANT
NDL 18GX1X1/2 (RX/OR ONLY) (NEEDLE) IMPLANT
NEEDLE 18GX1X1/2 (RX/OR ONLY) (NEEDLE) ×2 IMPLANT
NS IRRIG 1000ML POUR BTL (IV SOLUTION) ×2 IMPLANT
PACK ORTHO EXTREMITY (CUSTOM PROCEDURE TRAY) ×2 IMPLANT
PAD ARMBOARD 7.5X6 YLW CONV (MISCELLANEOUS) ×4 IMPLANT
PAD CAST 4YDX4 CTTN HI CHSV (CAST SUPPLIES) ×1 IMPLANT
PADDING CAST COTTON 4X4 STRL (CAST SUPPLIES)
SET HNDPC FAN SPRY TIP SCT (DISPOSABLE) IMPLANT
SPONGE GAUZE 4X4 12PLY (GAUZE/BANDAGES/DRESSINGS) ×1 IMPLANT
SPONGE LAP 18X18 X RAY DECT (DISPOSABLE) ×3 IMPLANT
STAPLER VISISTAT 35W (STAPLE) ×1 IMPLANT
STOCKINETTE IMPERVIOUS 9X36 MD (GAUZE/BANDAGES/DRESSINGS) IMPLANT
SUT ETHILON 2 0 PSLX (SUTURE) ×4 IMPLANT
SUT VIC AB 0 CT1 27 (SUTURE) ×2
SUT VIC AB 0 CT1 27XBRD ANBCTR (SUTURE) IMPLANT
SUT VIC AB 2-0 CT1 27 (SUTURE) ×2
SUT VIC AB 2-0 CT1 TAPERPNT 27 (SUTURE) IMPLANT
SYR 20CC LL (SYRINGE) ×1 IMPLANT
TOWEL OR 17X24 6PK STRL BLUE (TOWEL DISPOSABLE) ×2 IMPLANT
TOWEL OR 17X26 10 PK STRL BLUE (TOWEL DISPOSABLE) ×2 IMPLANT
WATER STERILE IRR 1000ML POUR (IV SOLUTION) ×2 IMPLANT

## 2013-10-07 NOTE — Anesthesia Preprocedure Evaluation (Addendum)
Anesthesia Evaluation  Patient identified by MRN, date of birth, ID band Patient awake    Reviewed: Allergy & Precautions, H&P , NPO status , Patient's Chart, lab work & pertinent test results  Airway Mallampati: I TM Distance: >3 FB Neck ROM: Full    Dental  (+) Teeth Intact   Pulmonary sleep apnea and Continuous Positive Airway Pressure Ventilation , Current Smoker,          Cardiovascular hypertension, Rhythm:Regular Rate:Normal     Neuro/Psych    GI/Hepatic   Endo/Other  diabetes  Renal/GU History of kidney stones     Musculoskeletal   Abdominal   Peds  Hematology   Anesthesia Other Findings   Reproductive/Obstetrics                        Anesthesia Physical Anesthesia Plan  ASA: II  Anesthesia Plan: General   Post-op Pain Management:    Induction: Intravenous  Airway Management Planned: LMA  Additional Equipment:   Intra-op Plan:   Post-operative Plan: Extubation in OR  Informed Consent: I have reviewed the patients History and Physical, chart, labs and discussed the procedure including the risks, benefits and alternatives for the proposed anesthesia with the patient or authorized representative who has indicated his/her understanding and acceptance.   Dental advisory given  Plan Discussed with: Surgeon and CRNA  Anesthesia Plan Comments:        Anesthesia Quick Evaluation

## 2013-10-07 NOTE — Op Note (Signed)
OPERATIVE REPORT  DATE OF SURGERY: 10/07/2013  PATIENT:  Jonathan Yu,  63 y.o. male  PRE-OPERATIVE DIAGNOSIS:  Infected Hardware Right Ankle  POST-OPERATIVE DIAGNOSIS:  Infected Hardware Right Ankle  PROCEDURE:  Procedure(s): RIGHT ANKLE HARDWARE REMOVAL Local tissue rearrangement for a wound 7 x 3 cm. Application of wound VAC. Application antibiotic beads 5 cc of stimulant +500 mg vancomycin and 160 mg gentamicin  SURGEON:  Surgeon(s): Newt Minion, MD  ANESTHESIA:   general  EBL:  min ML  SPECIMEN:  No Specimen  TOURNIQUET:  * No tourniquets in log *  PROCEDURE DETAILS: Patient is a 63 year old gentleman status post fibular osteotomy for total ankle arthroplasty. Patient has had wound breakdown with exposed hardware wound is 2 x 5 mm and presents at this time for excision of the deep retained hardware for the fibular osteotomy. Risks and benefits were discussed including persistent infection need for additional surgery. Patient states he understands and wished to proceed this time. Description of procedure patient was brought to the operating room and underwent a general anesthetic. After adequate levels and anesthesia obtained patient's right lower extremity was prepped using DuraPrep and draped into a sterile field. His previous lateral incision was used and  the ulcer was ellipsed out with the incision. The hardware was removed the bone was debrided with an periosteal elevator there was good bleeding in the bone no evidence of avascular necrosis the osteotomy site was well aligned and stable. The hardware deep screws and a tight rope anchor was removed. There were too tight Robaxin was left in place. The wound was irrigated with pulsatile lavage. The tissue was healthy there is no evidence of purulence no evidence of abscess no fluid collections. The wound was then filled with antibiotic beads 5 cc of stimulant beads with 500 mg vancomycin and 160 mg gentamicin. The skin was  closed without tension the skin using 2-0 nylon. A wound VAC was applied set to 75 mm of suction. This had a good suction fit. The patient was extubated taken to the PACU in stable condition. Plan for discharge on Saturday continue thank and Zosyn in the hospital with discharge on doxycycline.  PLAN OF CARE: Admit to inpatient   PATIENT DISPOSITION:  PACU - hemodynamically stable.   Newt Minion, MD 10/07/2013 10:46 AM

## 2013-10-07 NOTE — Anesthesia Postprocedure Evaluation (Signed)
Anesthesia Post Note  Patient: Jonathan Yu  Procedure(s) Performed: Procedure(s) (LRB): RIGHT ANKLE HARDWARE REMOVAL (Right)  Anesthesia type: general  Patient location: PACU  Post pain: Pain level controlled  Post assessment: Patient's Cardiovascular Status Stable  Last Vitals:  Filed Vitals:   10/07/13 1145  BP:   Pulse: 93  Temp: 36.8 C  Resp: 17    Post vital signs: Reviewed and stable  Level of consciousness: sedated  Complications: No apparent anesthesia complications

## 2013-10-07 NOTE — H&P (Signed)
Jonathan Yu is an 63 y.o. male.   Chief Complaint: Exposed hardware right ankle HPI: Patient is a 63 year old gentleman who is status post total ankle arthroplasty. Patient has had the wound breakdown over the fibular plate has exposed hardware and presents at this time about 5 weeks postoperatively for removal of deep retained hardware.  Past Medical History  Diagnosis Date  . Allergy   . Hyperlipidemia     "no meds since losing 106# 2 yr ago" (10/06/2013)  . Walking pneumonia 1968  . OSA on CPAP     "don't wear mask much since losing 106# 2 yr ago" (10/06/2013)  . Type II diabetes mellitus     "no meds since losing 106# 2 yr ago" (10/06/2013)  . Hypertension 2012    "no meds since losing 106# 2 yr ago" (10/06/2013)  . DDD (degenerative disc disease), lumbar     Past Surgical History  Procedure Laterality Date  . Shoulder arthroscopy w/ rotator cuff repair Right 2006; 2009  . Ureteral stent placement  ~ 2010 X 2  . Ankle reconstruction Right 05/2011  . Total ankle arthroplasty Right 08/18/2013    Procedure: TOTAL ANKLE ARTHOPLASTY;  Surgeon: Newt Minion, MD;  Location: [place];  Service: Orthopedics;  Laterality: Right;  Right Total Ankle Arthroplasty, Revision Fibular Fracture  . Nasal septum surgery  1982  . Ankle fracture surgery Left 1973  . Orif ankle fracture Right 09/08/2013    Procedure: REPAIR SYNDESMOSIS DISRUPTION RIGHT ANKLE;  Surgeon: Newt Minion, MD;  Location: [place];  Service: Orthopedics;  Laterality: Right;  . Tonsillectomy  1959  . Carpal tunnel release Bilateral 2001    Family History  Problem Relation Age of Onset  . Cancer Mother 60    Lung   Social History:  reports that he has been smoking Pipe and Cigars.  He has never used smokeless tobacco. He reports that he drinks alcohol. He reports that he does not use illicit drugs.  Allergies:  Allergies  Allergen Reactions  . Sesame Oil Hives and Shortness Of Breath  . Percocet [Oxycodone-Acetaminophen]  Rash  . Vicodin [Hydrocodone-Acetaminophen] Rash    Medications Prior to Admission  Medication Dose Route Frequency Provider Last Rate Last Dose  . testosterone cypionate (DEPOTESTOTERONE CYPIONATE) injection 200 mg  200 mg Intramuscular Q30 days Hali Marry, MD   200 mg at 03/25/11 1058   Medications Prior to Admission  Medication Sig Dispense Refill  . ibuprofen (ADVIL,MOTRIN) 200 MG tablet Take 800 mg by mouth every 6 (six) hours as needed for moderate pain.       Marland Kitchen loratadine (CLARITIN) 10 MG tablet Take 10 mg by mouth daily.      . Lorcaserin HCl (BELVIQ) 10 MG TABS Take 1 tablet by mouth 2 (two) times daily.      . Multiple Vitamin (MULTIVITAMIN WITH MINERALS) TABS tablet Take 1 tablet by mouth daily.      . mupirocin ointment (BACTROBAN) 2 % Apply 1 application topically 2 (two) times daily.      . Omega-3 Fatty Acids (FISH OIL PO) Take 1 capsule by mouth daily.      Marland Kitchen OVER THE COUNTER MEDICATION Take 1 tablet by mouth 4 (four) times daily - after meals and at bedtime. -pH balance formula Alkolete Yoli      . vitamin C (ASCORBIC ACID) 500 MG tablet Take 500 mg by mouth daily.        No results found for this or any  previous visit (from the past 48 hour(s)). No results found.  Review of Systems  All other systems reviewed and are negative.    Blood pressure 109/56, pulse 60, temperature 97.7 F (36.5 C), resp. rate 18, height 5' 6.5" (1.689 m), weight 109.317 kg (241 lb), SpO2 97.00%. Physical Exam  On examination there is no cellulitis no purulence no signs of infection. The wound is approximately 1 cm in length 2 mm in width and there is exposed hardware. Assessment/Plan Assessment: Exposed hardware right fibula status post total ankle arthroplasty.  Plan: Will plan for removal of deep retained hardware placement of antibiotic beads local tissue rearrangement for wound closure and placement of a wound VAC. Risks and benefits were discussed including persistent  infection need for additional surgery. Patient states he understands was to proceed at this time.  Archer Moist V 10/07/2013, 6:41 AM

## 2013-10-07 NOTE — Transfer of Care (Signed)
Immediate Anesthesia Transfer of Care Note  Patient: Jonathan Yu  Procedure(s) Performed: Procedure(s): RIGHT ANKLE HARDWARE REMOVAL (Right)  Patient Location: PACU  Anesthesia Type:General  Level of Consciousness: awake, alert  and oriented  Airway & Oxygen Therapy: Patient Spontanous Breathing and Patient connected to nasal cannula oxygen  Post-op Assessment: Report given to PACU RN and Post -op Vital signs reviewed and stable  Post vital signs: Reviewed and stable  Complications: No apparent anesthesia complications

## 2013-10-08 ENCOUNTER — Encounter (HOSPITAL_COMMUNITY): Payer: Self-pay | Admitting: Orthopedic Surgery

## 2013-10-08 DIAGNOSIS — T847XXA Infection and inflammatory reaction due to other internal orthopedic prosthetic devices, implants and grafts, initial encounter: Secondary | ICD-10-CM | POA: Diagnosis present

## 2013-10-08 MED ORDER — DIPHENHYDRAMINE HCL 25 MG PO CAPS
50.0000 mg | ORAL_CAPSULE | Freq: Four times a day (QID) | ORAL | Status: DC | PRN
  Administered 2013-10-08 – 2013-10-09 (×2): 50 mg via ORAL
  Filled 2013-10-08 (×3): qty 2

## 2013-10-08 MED ORDER — DOXYCYCLINE HYCLATE 50 MG PO CAPS: 100.0000 mg | ORAL_CAPSULE | Freq: Two times a day (BID) | ORAL | Status: DC

## 2013-10-08 MED ORDER — HYDROMORPHONE HCL 2 MG PO TABS
2.0000 mg | ORAL_TABLET | ORAL | Status: DC | PRN
  Administered 2013-10-08 – 2013-10-09 (×3): 2 mg via ORAL
  Filled 2013-10-08 (×3): qty 1

## 2013-10-08 MED ORDER — HYDROMORPHONE HCL 2 MG PO TABS: 2.0000 mg | ORAL_TABLET | ORAL | Status: DC | PRN

## 2013-10-08 NOTE — Discharge Instructions (Signed)
Keep dressing clean and dry. Minimize weightbearing. Continue elevation.

## 2013-10-08 NOTE — Progress Notes (Addendum)
Patient ID: Jonathan Yu, male   DOB: Dec 22, 1950, 63 y.o.   MRN: [id number] Status post removal of deep retained hardware. Plan for discharge to home on Saturday. Prescriptions on chart for doxycycline and Dilaudid by mouth. Minimize weightbearing. Wound VAC removed. Wound edge is well approximated there is no swelling no cellulitis clear serosanguineous drainage. Mepilex dressing and Ace wrap applied. Patient will continue with Mepilex dressing changes at home and use his compression socks.

## 2013-10-08 NOTE — Progress Notes (Signed)
Utilization review completed. Jazzlene Huot, RN, BSN. 

## 2013-10-08 NOTE — Discharge Summary (Signed)
Physician Discharge Summary  Patient ID: Jonathan Yu MRN: [id number] DOB/AGE: March 12, 1951 63 y.o.  Admit date: 10/06/2013 Discharge date: 10/08/2013  Admission Diagnoses: Infected deep retained hardware lateral malleolus right ankle  Discharge Diagnoses: Same Active Problems:   Infected hardware in right leg   Discharged Condition: stable  Hospital Course: Patient's hospital course was essentially unremarkable. He underwent removal of deep retained hardware for the fibula. Antibiotic beads were placed a wound VAC was placed. Patient was maintained on IV vancomycin and Zosyn preoperatively and postoperatively. Plan for discharge to home on Saturday with oral doxycycline.  Consults: None  Significant Diagnostic Studies: labs: Routine labs  Treatments: surgery: See operative note  Discharge Exam: Blood pressure 124/59, pulse 70, temperature 98.3 F (36.8 C), temperature source Oral, resp. rate 18, height 5' 6.5" (1.689 m), weight 109.317 kg (241 lb), SpO2 93.00%. Incision/Wound: dressing clean dry and intact  Disposition: 01-Home or Self Care   Future Appointments Provider Department Dept Phone   12/21/2013 9:30 AM Marcial Pacas, DO [facility] [phone]       Medication List    ASK your doctor about these medications       BELVIQ 10 MG Tabs  Generic drug:  Lorcaserin HCl  Take 1 tablet by mouth 2 (two) times daily.     FISH OIL PO  Take 1 capsule by mouth daily.     ibuprofen 200 MG tablet  Commonly known as:  ADVIL,MOTRIN  Take 800 mg by mouth every 6 (six) hours as needed for moderate pain.     loratadine 10 MG tablet  Commonly known as:  CLARITIN  Take 10 mg by mouth daily.     multivitamin with minerals Tabs tablet  Take 1 tablet by mouth daily.     mupirocin ointment 2 %  Commonly known as:  BACTROBAN  Apply 1 application topically 2 (two) times daily.     OVER THE COUNTER MEDICATION  Take 1 tablet by mouth 4 (four)  times daily - after meals and at bedtime. -pH balance formula Alkolete Yoli     vitamin C 500 MG tablet  Commonly known as:  ASCORBIC ACID  Take 500 mg by mouth daily.           Follow-up Information   Follow up with DUDA,MARCUS V, MD In 1 week.   Specialty:  Orthopedic Surgery   Contact information:   [place] [address] [phone]       Signed: Newt Minion 10/08/2013, 6:30 AM

## 2013-10-09 NOTE — Progress Notes (Signed)
Written and verbal discharge instructions provided to patient and family. Prescriptions for doxycycline and dilaudid given to patient with side effects to watch for instructions. IV out, area neg for reddness or adverse signs or symptoms. Patient discharge to home via wheelchair to car driven by family. R leg shows no s/s of adversity.

## 2013-10-09 NOTE — Progress Notes (Signed)
Subjective: 2 Days Post-Op Procedure(s) (LRB): RIGHT ANKLE HARDWARE REMOVAL (Right) Patient reports pain as mild.    Objective: Vital signs in last 24 hours: Temp:  [97.7 F (36.5 C)-98.4 F (36.9 C)] 97.7 F (36.5 C) (02/28 0557) Pulse Rate:  [61-67] 61 (02/28 0557) Resp:  [18-19] 18 (02/28 0557) BP: (115-122)/(55-70) 122/70 mmHg (02/28 0557) SpO2:  [95 %-97 %] 96 % (02/28 0557)  Intake/Output from previous day: 02/27 0701 - 02/28 0700 In: 370 [P.O.:120; IV Piggyback:250] Out: 2700 [Urine:2700] Intake/Output this shift:    No results found for this basename: HGB,  in the last 72 hours No results found for this basename: WBC, RBC, HCT, PLT,  in the last 72 hours No results found for this basename: NA, K, CL, CO2, BUN, CREATININE, GLUCOSE, CALCIUM,  in the last 72 hours No results found for this basename: LABPT, INR,  in the last 72 hours  Sensation intact distally small amount of serosanguinous drainage  Assessment/Plan: 2 Days Post-Op Procedure(s) (LRB): RIGHT ANKLE HARDWARE REMOVAL (Right) Advance diet Discharge home today Going to [place] on Dilaudid and Doxycycline.  Jonathan Yu 10/09/2013, 10:07 AM

## 2013-11-18 ENCOUNTER — Other Ambulatory Visit (HOSPITAL_COMMUNITY): Payer: Self-pay | Admitting: Orthopedic Surgery

## 2013-11-18 ENCOUNTER — Inpatient Hospital Stay (HOSPITAL_COMMUNITY)
Admission: AD | Admit: 2013-11-18 | Discharge: 2013-11-22 | DRG: 497 | Disposition: A | Discharge status: Home / Self Care | Payer: 59 | Source: Ambulatory Visit | Attending: Orthopedic Surgery | Admitting: Orthopedic Surgery

## 2013-11-18 ENCOUNTER — Encounter (HOSPITAL_COMMUNITY): Payer: 59 | Admitting: Certified Registered Nurse Anesthetist

## 2013-11-18 ENCOUNTER — Encounter (HOSPITAL_COMMUNITY)
Admission: AD | Disposition: A | Discharge status: Home / Self Care | Payer: Self-pay | Source: Ambulatory Visit | Attending: Orthopedic Surgery

## 2013-11-18 ENCOUNTER — Encounter (HOSPITAL_COMMUNITY): Payer: Self-pay | Admitting: Surgery

## 2013-11-18 ENCOUNTER — Inpatient Hospital Stay (HOSPITAL_COMMUNITY): Payer: 59 | Admitting: Certified Registered Nurse Anesthetist

## 2013-11-18 DIAGNOSIS — Z96669 Presence of unspecified artificial ankle joint: Secondary | ICD-10-CM

## 2013-11-18 DIAGNOSIS — E119 Type 2 diabetes mellitus without complications: Secondary | ICD-10-CM | POA: Diagnosis present

## 2013-11-18 DIAGNOSIS — M869 Osteomyelitis, unspecified: Secondary | ICD-10-CM | POA: Diagnosis present

## 2013-11-18 DIAGNOSIS — B957 Other staphylococcus as the cause of diseases classified elsewhere: Secondary | ICD-10-CM

## 2013-11-18 DIAGNOSIS — M86169 Other acute osteomyelitis, unspecified tibia and fibula: Principal | ICD-10-CM | POA: Diagnosis present

## 2013-11-18 DIAGNOSIS — Z113 Encounter for screening for infections with a predominantly sexual mode of transmission: Secondary | ICD-10-CM

## 2013-11-18 DIAGNOSIS — T847XXA Infection and inflammatory reaction due to other internal orthopedic prosthetic devices, implants and grafts, initial encounter: Secondary | ICD-10-CM

## 2013-11-18 DIAGNOSIS — F17200 Nicotine dependence, unspecified, uncomplicated: Secondary | ICD-10-CM | POA: Diagnosis present

## 2013-11-18 DIAGNOSIS — M5416 Radiculopathy, lumbar region: Secondary | ICD-10-CM

## 2013-11-18 DIAGNOSIS — M86161 Other acute osteomyelitis, right tibia and fibula: Secondary | ICD-10-CM | POA: Diagnosis present

## 2013-11-18 DIAGNOSIS — Z6838 Body mass index (BMI) 38.0-38.9, adult: Secondary | ICD-10-CM

## 2013-11-18 DIAGNOSIS — E785 Hyperlipidemia, unspecified: Secondary | ICD-10-CM | POA: Diagnosis present

## 2013-11-18 DIAGNOSIS — Z888 Allergy status to other drugs, medicaments and biological substances status: Secondary | ICD-10-CM

## 2013-11-18 DIAGNOSIS — I10 Essential (primary) hypertension: Secondary | ICD-10-CM | POA: Diagnosis present

## 2013-11-18 DIAGNOSIS — G4733 Obstructive sleep apnea (adult) (pediatric): Secondary | ICD-10-CM | POA: Diagnosis present

## 2013-11-18 HISTORY — PX: I & D EXTREMITY: SHX5045

## 2013-11-18 LAB — CBC
HCT: 44.3 % (ref 39.0–52.0)
Hemoglobin: 15.4 g/dL (ref 13.0–17.0)
MCH: 28.8 pg (ref 26.0–34.0)
MCHC: 34.8 g/dL (ref 30.0–36.0)
MCV: 82.8 fL (ref 78.0–100.0)
Platelets: 230 10*3/uL (ref 150–400)
RBC: 5.35 MIL/uL (ref 4.22–5.81)
RDW: 13 % (ref 11.5–15.5)
WBC: 7.9 10*3/uL (ref 4.0–10.5)

## 2013-11-18 LAB — COMPREHENSIVE METABOLIC PANEL
ALT: 29 U/L (ref 0–53)
AST: 26 U/L (ref 0–37)
Albumin: 4 g/dL (ref 3.5–5.2)
Alkaline Phosphatase: 55 U/L (ref 39–117)
BUN: 20 mg/dL (ref 6–23)
CO2: 21 mEq/L (ref 19–32)
Calcium: 10 mg/dL (ref 8.4–10.5)
Chloride: 100 mEq/L (ref 96–112)
Creatinine, Ser: 0.73 mg/dL (ref 0.50–1.35)
GFR calc Af Amer: >90 mL/min (ref >90)
GFR calc non Af Amer: >90 mL/min (ref >90)
Glucose, Bld: 93 mg/dL (ref 70–99)
Potassium: 4.1 mEq/L (ref 3.7–5.3)
Sodium: 139 mEq/L (ref 137–147)
Total Bilirubin: 1.2 mg/dL (ref 0.3–1.2)
Total Protein: 7.5 g/dL (ref 6.0–8.3)

## 2013-11-18 LAB — GLUCOSE, CAPILLARY
Glucose-Capillary: 82 mg/dL (ref 70–99)
Glucose-Capillary: 103 mg/dL — ABNORMAL HIGH (ref 70–99)

## 2013-11-18 LAB — PROTIME-INR
INR: 0.94 (ref 0.00–1.49)
Prothrombin Time: 12.4 seconds (ref 11.6–15.2)

## 2013-11-18 SURGERY — IRRIGATION AND DEBRIDEMENT EXTREMITY
Anesthesia: General | Site: Ankle | Laterality: Right

## 2013-11-18 MED ORDER — VANCOMYCIN HCL 500 MG IV SOLR
INTRAVENOUS | Status: DC | PRN
  Administered 2013-11-18: 500 mg

## 2013-11-18 MED ORDER — METOCLOPRAMIDE HCL 5 MG/ML IJ SOLN: 10.0000 mg | Freq: Once | INTRAMUSCULAR | Status: DC | PRN

## 2013-11-18 MED ORDER — VANCOMYCIN HCL IN DEXTROSE 1-5 GM/200ML-% IV SOLN
1000.0000 mg | Freq: Two times a day (BID) | INTRAVENOUS | Status: AC
  Administered 2013-11-19: 1000 mg via INTRAVENOUS
  Filled 2013-11-18: qty 200

## 2013-11-18 MED ORDER — PROPOFOL 10 MG/ML IV BOLUS
INTRAVENOUS | Status: DC | PRN
  Administered 2013-11-18: 200 mg via INTRAVENOUS

## 2013-11-18 MED ORDER — VANCOMYCIN HCL IN DEXTROSE 1-5 GM/200ML-% IV SOLN
INTRAVENOUS | Status: AC
  Filled 2013-11-18: qty 200

## 2013-11-18 MED ORDER — LIDOCAINE HCL (CARDIAC) 20 MG/ML IV SOLN
INTRAVENOUS | Status: DC | PRN
  Administered 2013-11-18: 60 mg via INTRAVENOUS

## 2013-11-18 MED ORDER — PIPERACILLIN-TAZOBACTAM 3.375 G IVPB
3.3750 g | Freq: Three times a day (TID) | INTRAVENOUS | Status: DC
  Administered 2013-11-18 – 2013-11-19 (×3): 3.375 g via INTRAVENOUS
  Filled 2013-11-18 (×5): qty 50

## 2013-11-18 MED ORDER — LACTATED RINGERS IV SOLN
INTRAVENOUS | Status: DC
  Administered 2013-11-18: 16:00:00 via INTRAVENOUS

## 2013-11-18 MED ORDER — METOCLOPRAMIDE HCL 5 MG/ML IJ SOLN: 5.0000 mg | Freq: Three times a day (TID) | INTRAMUSCULAR | Status: DC | PRN

## 2013-11-18 MED ORDER — 0.9 % SODIUM CHLORIDE (POUR BTL) OPTIME
TOPICAL | Status: DC | PRN
  Administered 2013-11-18: 1000 mL

## 2013-11-18 MED ORDER — MIDAZOLAM HCL 5 MG/5ML IJ SOLN
INTRAMUSCULAR | Status: DC | PRN
  Administered 2013-11-18: 2 mg via INTRAVENOUS

## 2013-11-18 MED ORDER — SODIUM CHLORIDE 0.9 % IV SOLN
INTRAVENOUS | Status: DC
  Administered 2013-11-18: 21:00:00 via INTRAVENOUS

## 2013-11-18 MED ORDER — GENTAMICIN SULFATE 40 MG/ML IJ SOLN
INTRAMUSCULAR | Status: DC | PRN
  Administered 2013-11-18: 80 mg via INTRAMUSCULAR

## 2013-11-18 MED ORDER — FENTANYL CITRATE 0.05 MG/ML IJ SOLN
INTRAMUSCULAR | Status: AC
  Filled 2013-11-18: qty 5

## 2013-11-18 MED ORDER — LACTATED RINGERS IV SOLN
INTRAVENOUS | Status: DC | PRN
  Administered 2013-11-18: 19:00:00 via INTRAVENOUS

## 2013-11-18 MED ORDER — VANCOMYCIN HCL 1000 MG IV SOLR
1000.0000 mg | INTRAVENOUS | Status: DC | PRN
  Administered 2013-11-18: 1000 mg via INTRAVENOUS

## 2013-11-18 MED ORDER — TRAMADOL HCL 50 MG PO TABS
100.0000 mg | ORAL_TABLET | Freq: Four times a day (QID) | ORAL | Status: DC | PRN
  Administered 2013-11-21 (×2): 100 mg via ORAL
  Filled 2013-11-18 (×2): qty 2

## 2013-11-18 MED ORDER — ONDANSETRON HCL 4 MG/2ML IJ SOLN
INTRAMUSCULAR | Status: DC | PRN
  Administered 2013-11-18: 4 mg via INTRAVENOUS

## 2013-11-18 MED ORDER — ONDANSETRON HCL 4 MG PO TABS: 4.0000 mg | ORAL_TABLET | Freq: Four times a day (QID) | ORAL | Status: DC | PRN

## 2013-11-18 MED ORDER — SODIUM CHLORIDE 0.9 % IR SOLN
Status: DC | PRN
  Administered 2013-11-18: 3000 mL

## 2013-11-18 MED ORDER — HYDROMORPHONE HCL PF 1 MG/ML IJ SOLN
INTRAMUSCULAR | Status: AC
  Administered 2013-11-18: 0.5 mg via INTRAVENOUS
  Filled 2013-11-18: qty 2

## 2013-11-18 MED ORDER — ONDANSETRON HCL 4 MG/2ML IJ SOLN: 4.0000 mg | Freq: Four times a day (QID) | INTRAMUSCULAR | Status: DC | PRN

## 2013-11-18 MED ORDER — HYDROMORPHONE HCL PF 1 MG/ML IJ SOLN
0.5000 mg | INTRAMUSCULAR | Status: DC | PRN
  Administered 2013-11-18 – 2013-11-22 (×15): 1 mg via INTRAVENOUS
  Filled 2013-11-18 (×15): qty 1

## 2013-11-18 MED ORDER — METOCLOPRAMIDE HCL 10 MG PO TABS: 5.0000 mg | ORAL_TABLET | Freq: Three times a day (TID) | ORAL | Status: DC | PRN

## 2013-11-18 MED ORDER — MIDAZOLAM HCL 2 MG/2ML IJ SOLN
INTRAMUSCULAR | Status: AC
  Filled 2013-11-18: qty 2

## 2013-11-18 MED ORDER — PROPOFOL 10 MG/ML IV BOLUS
INTRAVENOUS | Status: AC
  Filled 2013-11-18: qty 20

## 2013-11-18 MED ORDER — HYDROMORPHONE HCL PF 1 MG/ML IJ SOLN
0.2500 mg | INTRAMUSCULAR | Status: DC | PRN
  Administered 2013-11-18 (×4): 0.5 mg via INTRAVENOUS

## 2013-11-18 MED ORDER — FENTANYL CITRATE 0.05 MG/ML IJ SOLN
INTRAMUSCULAR | Status: DC | PRN
  Administered 2013-11-18: 100 ug via INTRAVENOUS
  Administered 2013-11-18 (×3): 50 ug via INTRAVENOUS

## 2013-11-18 SURGICAL SUPPLY — 45 items
BLADE SURG 10 STRL SS (BLADE) IMPLANT
BNDG COHESIVE 4X5 TAN STRL (GAUZE/BANDAGES/DRESSINGS) IMPLANT
BNDG COHESIVE 6X5 TAN STRL LF (GAUZE/BANDAGES/DRESSINGS) IMPLANT
CANISTER WOUND CARE 500ML ATS (WOUND CARE) ×1 IMPLANT
COVER SURGICAL LIGHT HANDLE (MISCELLANEOUS) ×2 IMPLANT
CUFF TOURNIQUET SINGLE 18IN (TOURNIQUET CUFF) IMPLANT
CUFF TOURNIQUET SINGLE 24IN (TOURNIQUET CUFF) IMPLANT
CUFF TOURNIQUET SINGLE 34IN LL (TOURNIQUET CUFF) IMPLANT
CUFF TOURNIQUET SINGLE 44IN (TOURNIQUET CUFF) IMPLANT
DRAPE INCISE IOBAN 66X45 STRL (DRAPES) ×3 IMPLANT
DRAPE U-SHAPE 47X51 STRL (DRAPES) ×2 IMPLANT
DRSG ADAPTIC 3X8 NADH LF (GAUZE/BANDAGES/DRESSINGS) ×2 IMPLANT
DRSG VAC ATS SM SENSATRAC (GAUZE/BANDAGES/DRESSINGS) ×1 IMPLANT
DURAPREP 26ML APPLICATOR (WOUND CARE) ×2 IMPLANT
ELECT CAUTERY BLADE 6.4 (BLADE) IMPLANT
ELECT REM PT RETURN 9FT ADLT (ELECTROSURGICAL)
ELECTRODE REM PT RTRN 9FT ADLT (ELECTROSURGICAL) IMPLANT
GLOVE BIOGEL PI IND STRL 9 (GLOVE) ×1 IMPLANT
GLOVE BIOGEL PI INDICATOR 9 (GLOVE) ×1
GLOVE SURG ORTHO 9.0 STRL STRW (GLOVE) ×2 IMPLANT
GOWN STRL REUS W/ TWL XL LVL3 (GOWN DISPOSABLE) ×2 IMPLANT
GOWN STRL REUS W/TWL XL LVL3 (GOWN DISPOSABLE) ×4
HANDPIECE INTERPULSE COAX TIP (DISPOSABLE) ×4
KIT BASIN OR (CUSTOM PROCEDURE TRAY) ×2 IMPLANT
KIT ROOM TURNOVER OR (KITS) ×2 IMPLANT
KIT STIMULAN RAPID CURE 5CC (Orthopedic Implant) ×1 IMPLANT
MANIFOLD NEPTUNE II (INSTRUMENTS) ×2 IMPLANT
NS IRRIG 1000ML POUR BTL (IV SOLUTION) ×2 IMPLANT
PACK ORTHO EXTREMITY (CUSTOM PROCEDURE TRAY) ×2 IMPLANT
PAD ARMBOARD 7.5X6 YLW CONV (MISCELLANEOUS) ×4 IMPLANT
PADDING CAST COTTON 6X4 STRL (CAST SUPPLIES) ×2 IMPLANT
SET HNDPC FAN SPRY TIP SCT (DISPOSABLE) IMPLANT
SPONGE GAUZE 4X4 12PLY (GAUZE/BANDAGES/DRESSINGS) ×2 IMPLANT
SPONGE LAP 18X18 X RAY DECT (DISPOSABLE) ×3 IMPLANT
STOCKINETTE IMPERVIOUS 9X36 MD (GAUZE/BANDAGES/DRESSINGS) IMPLANT
SUT ETHILON 2 0 PSLX (SUTURE) ×1 IMPLANT
SWAB COLLECTION DEVICE MRSA (MISCELLANEOUS) ×2 IMPLANT
SWAB CULTURE LIQUID MINI MALE (MISCELLANEOUS) ×2 IMPLANT
TOWEL OR 17X24 6PK STRL BLUE (TOWEL DISPOSABLE) ×2 IMPLANT
TOWEL OR 17X26 10 PK STRL BLUE (TOWEL DISPOSABLE) ×2 IMPLANT
TUBE ANAEROBIC SPECIMEN COL (MISCELLANEOUS) ×1 IMPLANT
TUBE CONNECTING 12X1/4 (SUCTIONS) ×2 IMPLANT
UNDERPAD 30X30 INCONTINENT (UNDERPADS AND DIAPERS) ×2 IMPLANT
WATER STERILE IRR 1000ML POUR (IV SOLUTION) ×1 IMPLANT
YANKAUER SUCT BULB TIP NO VENT (SUCTIONS) ×2 IMPLANT

## 2013-11-18 NOTE — Anesthesia Postprocedure Evaluation (Signed)
Anesthesia Post Note  Patient: Jonathan Yu  Procedure(s) Performed: Procedure(s) (LRB): IRRIGATION AND DEBRIDEMENT EXTREMITY (Right)  Anesthesia type: General  Patient location: PACU  Post pain: Pain level controlled  Post assessment: Patient's Cardiovascular Status Stable  Last Vitals:  Filed Vitals:   11/18/13 2012  BP: 126/77  Pulse:   Temp:   Resp:     Post vital signs: Reviewed and stable  Level of consciousness: alert  Complications: No apparent anesthesia complications

## 2013-11-18 NOTE — Anesthesia Preprocedure Evaluation (Signed)
Anesthesia Evaluation  Patient identified by MRN, date of birth, ID band Patient awake    Reviewed: Allergy & Precautions, H&P , NPO status , Patient's Chart, lab work & pertinent test results, reviewed documented beta blocker date and time   Airway Mallampati: II TM Distance: >3 FB Neck ROM: full    Dental   Pulmonary sleep apnea , pneumonia -, resolved, Current Smoker,  breath sounds clear to auscultation        Cardiovascular hypertension, On Medications Rhythm:regular     Neuro/Psych  Neuromuscular disease negative psych ROS   GI/Hepatic negative GI ROS, Neg liver ROS,   Endo/Other  diabetesMorbid obesity  Renal/GU Renal diseasenegative Renal ROS  negative genitourinary   Musculoskeletal   Abdominal   Peds  Hematology negative hematology ROS (+)   Anesthesia Other Findings See surgeon's H&P   Reproductive/Obstetrics negative OB ROS                           Anesthesia Physical Anesthesia Plan  ASA: III  Anesthesia Plan: General   Post-op Pain Management:    Induction: Intravenous  Airway Management Planned: LMA  Additional Equipment:   Intra-op Plan:   Post-operative Plan:   Informed Consent: I have reviewed the patients History and Physical, chart, labs and discussed the procedure including the risks, benefits and alternatives for the proposed anesthesia with the patient or authorized representative who has indicated his/her understanding and acceptance.   Dental Advisory Given  Plan Discussed with: CRNA and Surgeon  Anesthesia Plan Comments:         Anesthesia Quick Evaluation

## 2013-11-18 NOTE — Progress Notes (Signed)
Orthopedic Tech Progress Note Patient Details:  Jonathan Yu [birth year] [id number]  Casting Type of Cast: Short leg cast Cast Material: Fiberglass Cast Intervention: Removal     Ashok Cordia 11/18/2013, 5:18 PM

## 2013-11-18 NOTE — Progress Notes (Signed)
ANTIBIOTIC CONSULT NOTE - INITIAL  Pharmacy Consult for Zosyn Indication: R fibula osteo  Allergies  Allergen Reactions  . Sesame Oil Hives and Shortness Of Breath  . Percocet [Oxycodone-Acetaminophen] Rash  . Vicodin [Hydrocodone-Acetaminophen] Rash    Patient Measurements: Height: 5\' 6"  (167.6 cm) Weight: 240 lb (108.863 kg) IBW/kg (Calculated) : 63.8 Adjusted Body Weight:   Vital Signs: Temp: 97 F (36.1 C) (04/09 2026) Temp src: Oral (04/09 1531) BP: 119/82 mmHg (04/09 2026) Pulse Rate: 73 (04/09 2031) Intake/Output from previous day:   Intake/Output from this shift: Total I/O In: 60 [I.V.:60] Out: 25 [Drains:25]  Labs:  Recent Labs  11/18/13 1522  WBC 7.9  HGB 15.4  PLT 230  CREATININE 0.73   Estimated Creatinine Clearance: 112.2 ml/min (by C-G formula based on Cr of 0.73). No results found for this basename: VANCOTROUGH, VANCOPEAK, VANCORANDOM, GENTTROUGH, GENTPEAK, GENTRANDOM, TOBRATROUGH, TOBRAPEAK, TOBRARND, AMIKACINPEAK, AMIKACINTROU, AMIKACIN,  in the last 72 hours   Microbiology: No results found for this or any previous visit (from the past 720 hour(s)).  Medical History: Past Medical History  Diagnosis Date  . Allergy   . Hyperlipidemia     "no meds since losing 106# 2 yr ago" (10/06/2013)  . Walking pneumonia 1968  . OSA on CPAP     "don't wear mask much since losing 106# 2 yr ago" (10/06/2013)  . Type II diabetes mellitus     "no meds since losing 106# 2 yr ago" (10/06/2013)  . Hypertension 2012    "no meds since losing 106# 2 yr ago" (10/06/2013)  . DDD (degenerative disc disease), lumbar     Medications:   Assessment: 63 y/o M s/p ankle arthoplasty with infection from retained hardware, hardware removed, and has had progressive wound breakdown with further need for I&D. Has had placement of abx beads, PICC line, and plans for 6wks abs. ID consult pending. Cultures pending  Labs: WBC 7.9. Scr 0.73, CrCl 112   Plan:  Zosyn 3.375g IV  q8hrs.  No further dose adjustment required unless CrCl<20. Pharmacy will sign off. Please reconsult if further dosing assistance required. Thanks!  Prince Couey S. Alford Highland, PharmD, [facility] Clinical Staff Pharmacist Pager [phone]  Coralyn Pear Alford Highland 11/18/2013,8:57 PM

## 2013-11-18 NOTE — Op Note (Signed)
OPERATIVE REPORT  DATE OF SURGERY: 11/18/2013  PATIENT:  Jonathan Yu,  63 y.o. male  PRE-OPERATIVE DIAGNOSIS:  Infected Right Fibula  POST-OPERATIVE DIAGNOSIS:  Infected Right Fibula  PROCEDURE:  Procedure(s): IRRIGATION AND DEBRIDEMENT EXTREMITY excision skin soft tissue muscle and bone. Excision of skin soft tissue muscle and bone as well as removal of 2 tight rope anchors Cultures obtained x2. Placement of antibiotic beads with stimulant 5 cc with 500 mg vancomycin 160 mg gentamicin. Local tissue rearrangement wound 15 x 1 cm. Placement of an incisional wound VAC.  SURGEON:  Surgeon(s): Newt Minion, MD  ANESTHESIA:   general  EBL:  min ML  SPECIMEN:  Source of Specimen:  Wound cultures x2  TOURNIQUET:  * No tourniquets in log *  PROCEDURE DETAILS: Patient is a 63 year old gentleman status post total ankle status post infection from fibular hardware who has had the hardware removed underwent irrigation and debridement and this had a wound dermatitis and wound breakdown and presents at this time for repeat irrigation and debridement of the fibula. Risks and benefits were discussed including nonhealing of the wound need for further debridement. Patient states he understands and wishes to proceed at this time. Description of procedure patient brought to operating room underwent a general anesthetic. After adequate levels of anesthesia were obtained patient's right lower extremity was prepped using Hibiclens scrub to dry and then prepped using DuraPrep and draped into a sterile field. An elliptical incision was made around the wound dehiscence which was approximately 1 cm wide and approximately 15 cm long. This was carried sharply down to bone. The bone was debridement there were 2 tight rope anchors which were loose and could be the source of the infection these were removed and the tight rope suture was removed as well as the buttons. The bone was debrided back to bleeding viable  bone. The wound was irrigated with pulsatile lavage. The wound had good healthy muscle there is no purulence no abscess no signs of any significant infection cultures were obtained. The wound was then filled with antibiotic beads with stimulant beads 5 cc with 500 mg vancomycin 160 mg gentamicin. The wound was closed with local tissue rearrangement with 2-0 nylon. The wound closed without tension the skin. A wound VAC was applied a posterior splint was applied patient was extubated taken to the PACU in stable condition.  PLAN OF CARE: Admit to inpatient   PATIENT DISPOSITION:  PACU - hemodynamically stable.   Newt Minion, MD 11/18/2013 7:39 PM

## 2013-11-18 NOTE — H&P (Signed)
Jonathan Yu is an 63 y.o. male.   Chief Complaint: Osteomyelitis right fibula HPI: Patient is 63 year old gentleman status post total ankle arthroplasty status post infection from retained hardware of the fibula who has had fibular hardware removed has had progressive wound breakdown and presents at this time for repeat irrigation and debridement.  Past Medical History  Diagnosis Date  . Allergy   . Hyperlipidemia     "no meds since losing 106# 2 yr ago" (10/06/2013)  . Walking pneumonia 1968  . OSA on CPAP     "don't wear mask much since losing 106# 2 yr ago" (10/06/2013)  . Type II diabetes mellitus     "no meds since losing 106# 2 yr ago" (10/06/2013)  . Hypertension 2012    "no meds since losing 106# 2 yr ago" (10/06/2013)  . DDD (degenerative disc disease), lumbar     Past Surgical History  Procedure Laterality Date  . Shoulder arthroscopy w/ rotator cuff repair Right 2006; 2009  . Ureteral stent placement  ~ 2010 X 2  . Ankle reconstruction Right 05/2011  . Total ankle arthroplasty Right 08/18/2013    Procedure: TOTAL ANKLE ARTHOPLASTY;  Surgeon: Newt Minion, MD;  Location: [facility];  Service: Orthopedics;  Laterality: Right;  Right Total Ankle Arthroplasty, Revision Fibular Fracture  . Nasal septum surgery  1982  . Ankle fracture surgery Left 1973  . Orif ankle fracture Right 09/08/2013    Procedure: REPAIR SYNDESMOSIS DISRUPTION RIGHT ANKLE;  Surgeon: Newt Minion, MD;  Location: [facility];  Service: Orthopedics;  Laterality: Right;  . Tonsillectomy  1959  . Carpal tunnel release Bilateral 2001  . Hardware removal Right 10/07/2013    Procedure: RIGHT ANKLE HARDWARE REMOVAL;  Surgeon: Newt Minion, MD;  Location: [facility];  Service: Orthopedics;  Laterality: Right;    Family History  Problem Relation Age of Onset  . Cancer Mother 60    Lung   Social History:  reports that he has been smoking [employer] Cigars.  He has never used smokeless tobacco. He reports that he drinks  alcohol. He reports that he does not use illicit drugs.  Allergies:  Allergies  Allergen Reactions  . Sesame Oil Hives and Shortness Of Breath  . Percocet [Oxycodone-Acetaminophen] Rash  . Vicodin [Hydrocodone-Acetaminophen] Rash    Medications Prior to Admission  Medication Dose Route Frequency Provider Last Rate Last Dose  . testosterone cypionate (DEPOTESTOTERONE CYPIONATE) injection 200 mg  200 mg Intramuscular Q30 days Hali Marry, MD   200 mg at 03/25/11 1058   Medications Prior to Admission  Medication Sig Dispense Refill  . doxycycline (VIBRAMYCIN) 50 MG capsule Take 2 capsules (100 mg total) by mouth 2 (two) times daily.  60 capsule  0  . HYDROmorphone (DILAUDID) 2 MG tablet Take 1 tablet (2 mg total) by mouth every 4 (four) hours as needed for severe pain.  30 tablet  0  . ibuprofen (ADVIL,MOTRIN) 200 MG tablet Take 800 mg by mouth every 6 (six) hours as needed for moderate pain.       Marland Kitchen loratadine (CLARITIN) 10 MG tablet Take 10 mg by mouth daily.      . Lorcaserin HCl (BELVIQ) 10 MG TABS Take 1 tablet by mouth 2 (two) times daily.      . Multiple Vitamin (MULTIVITAMIN WITH MINERALS) TABS tablet Take 1 tablet by mouth daily.      . Omega-3 Fatty Acids (FISH OIL PO) Take 1 capsule by mouth daily.      Marland Kitchen  vitamin C (ASCORBIC ACID) 500 MG tablet Take 500 mg by mouth daily.      . mupirocin ointment (BACTROBAN) 2 % Apply 1 application topically 2 (two) times daily.      Marland Kitchen OVER THE COUNTER MEDICATION Take 1 tablet by mouth 4 (four) times daily - after meals and at bedtime. -pH balance formula Alkolete Yoli        Results for orders placed during the hospital encounter of 11/18/13 (from the past 48 hour(s))  CBC     Status: None   Collection Time    11/18/13  3:22 PM      Result Value Ref Range   WBC 7.9  4.0 - 10.5 K/uL   RBC 5.35  4.22 - 5.81 MIL/uL   Hemoglobin 15.4  13.0 - 17.0 g/dL   HCT 44.3  39.0 - 52.0 %   MCV 82.8  78.0 - 100.0 fL   MCH 28.8  26.0 - 34.0 pg    MCHC 34.8  30.0 - 36.0 g/dL   RDW 13.0  11.5 - 15.5 %   Platelets 230  150 - 400 K/uL  COMPREHENSIVE METABOLIC PANEL     Status: None   Collection Time    11/18/13  3:22 PM      Result Value Ref Range   Sodium 139  137 - 147 mEq/L   Potassium 4.1  3.7 - 5.3 mEq/L   Chloride 100  96 - 112 mEq/L   CO2 21  19 - 32 mEq/L   Glucose, Bld 93  70 - 99 mg/dL   BUN 20  6 - 23 mg/dL   Creatinine, Ser 0.73  0.50 - 1.35 mg/dL   Calcium 10.0  8.4 - 10.5 mg/dL   Total Protein 7.5  6.0 - 8.3 g/dL   Albumin 4.0  3.5 - 5.2 g/dL   AST 26  0 - 37 U/L   ALT 29  0 - 53 U/L   Alkaline Phosphatase 55  39 - 117 U/L   Total Bilirubin 1.2  0.3 - 1.2 mg/dL   GFR calc non Af Amer >90  >90 mL/min   GFR calc Af Amer >90  >90 mL/min   Comment: (NOTE)     The eGFR has been calculated using the CKD EPI equation.     This calculation has not been validated in all clinical situations.     eGFR's persistently <90 mL/min signify possible Chronic Kidney     Disease.  PROTIME-INR     Status: None   Collection Time    11/18/13  3:22 PM      Result Value Ref Range   Prothrombin Time 12.4  11.6 - 15.2 seconds   INR 0.94  0.00 - 1.49  GLUCOSE, CAPILLARY     Status: None   Collection Time    11/18/13  3:51 PM      Result Value Ref Range   Glucose-Capillary 82  70 - 99 mg/dL  GLUCOSE, CAPILLARY     Status: Abnormal   Collection Time    11/18/13  7:38 PM      Result Value Ref Range   Glucose-Capillary 103 (*) 70 - 99 mg/dL   No results found.  Review of Systems  All other systems reviewed and are negative.   Blood pressure 138/82, pulse 78, temperature 98 F (36.7 C), temperature source Oral, resp. rate 16, height $RemoveBe'5\' 6"'RDkoTTsnr$  (1.676 m), weight 108.863 kg (240 lb), SpO2 97.00%. Physical Exam  On examination patient  has wound breakdown with dermatitis around the wound edges. Assessment/Plan Assessment: Osteomyelitis fibula secondary to complications from hardware.  Plan: We'll plan for repeat irrigation and  debridement of the fibula removal of the deep syndesmotic tight rope anchors. Debridement of bone skin soft tissue muscle placement of antibiotic beads placement of a PICC line with IV antibiotics for 6 weeks. We'll consult infectious disease.  Newt Minion 11/18/2013, 7:52 PM

## 2013-11-18 NOTE — Transfer of Care (Signed)
Immediate Anesthesia Transfer of Care Note  Patient: Jonathan Yu  Procedure(s) Performed: Procedure(s) with comments: IRRIGATION AND DEBRIDEMENT EXTREMITY (Right) - Irrigation and Debridement Right Fibula , Place Antibiotic Beads and VAC  Patient Location: PACU  Anesthesia Type:General  Level of Consciousness: awake, alert  and oriented  Airway & Oxygen Therapy: Patient Spontanous Breathing and Patient connected to nasal cannula oxygen  Post-op Assessment: Report given to PACU RN and Post -op Vital signs reviewed and stable  Post vital signs: Reviewed and stable  Complications: No apparent anesthesia complications

## 2013-11-19 DIAGNOSIS — G4733 Obstructive sleep apnea (adult) (pediatric): Secondary | ICD-10-CM

## 2013-11-19 DIAGNOSIS — Y849 Medical procedure, unspecified as the cause of abnormal reaction of the patient, or of later complication, without mention of misadventure at the time of the procedure: Secondary | ICD-10-CM

## 2013-11-19 DIAGNOSIS — M5137 Other intervertebral disc degeneration, lumbosacral region: Secondary | ICD-10-CM

## 2013-11-19 DIAGNOSIS — E785 Hyperlipidemia, unspecified: Secondary | ICD-10-CM

## 2013-11-19 DIAGNOSIS — M86169 Other acute osteomyelitis, unspecified tibia and fibula: Principal | ICD-10-CM

## 2013-11-19 DIAGNOSIS — I10 Essential (primary) hypertension: Secondary | ICD-10-CM

## 2013-11-19 DIAGNOSIS — E119 Type 2 diabetes mellitus without complications: Secondary | ICD-10-CM

## 2013-11-19 DIAGNOSIS — T847XXA Infection and inflammatory reaction due to other internal orthopedic prosthetic devices, implants and grafts, initial encounter: Secondary | ICD-10-CM

## 2013-11-19 MED ORDER — SODIUM CHLORIDE 0.9 % IJ SOLN
10.0000 mL | INTRAMUSCULAR | Status: DC | PRN
  Administered 2013-11-20 – 2013-11-22 (×2): 10 mL

## 2013-11-19 MED ORDER — RIFAMPIN 300 MG PO CAPS
300.0000 mg | ORAL_CAPSULE | Freq: Two times a day (BID) | ORAL | Status: DC
  Administered 2013-11-19 – 2013-11-22 (×6): 300 mg via ORAL
  Filled 2013-11-19 (×7): qty 1

## 2013-11-19 MED ORDER — DEXTROSE 5 % IV SOLN
2.0000 g | INTRAVENOUS | Status: DC
  Administered 2013-11-19 – 2013-11-21 (×3): 2 g via INTRAVENOUS
  Filled 2013-11-19 (×4): qty 2

## 2013-11-19 MED ORDER — DIPHENHYDRAMINE HCL 12.5 MG/5ML PO ELIX
25.0000 mg | ORAL_SOLUTION | Freq: Four times a day (QID) | ORAL | Status: DC | PRN
  Administered 2013-11-19 – 2013-11-22 (×3): 25 mg via ORAL
  Filled 2013-11-19 (×3): qty 10

## 2013-11-19 NOTE — Progress Notes (Signed)
UR completed. Pavlos Yon RN CCM Case Mgmt phone [phone] 

## 2013-11-19 NOTE — Progress Notes (Signed)
Orthopedic Tech Progress Note Patient Details:  Jonathan Yu 17-Feb-1951 [id number]  Patient ID: [name], male   DOB: 23-Sep-1950, 63 y.o.   MRN: [id number]   [name] 11/19/2013, 4:16 PMTrapeze bar

## 2013-11-19 NOTE — Progress Notes (Signed)
Peripherally Inserted Central Catheter/Midline Placement  The IV Nurse has discussed with the patient and/or persons authorized to consent for the patient, the purpose of this procedure and the potential benefits and risks involved with this procedure.  The benefits include less needle sticks, lab draws from the catheter and patient may be discharged home with the catheter.  Risks include, but not limited to, infection, bleeding, blood clot (thrombus formation), and puncture of an artery; nerve damage and irregular heat beat.  Alternatives to this procedure were also discussed.  PICC/Midline Placement Documentation        Jonathan Yu 11/19/2013, 6:47 PM

## 2013-11-19 NOTE — Care Management Note (Signed)
  CARE MANAGEMENT NOTE 11/19/2013  Patient:  Jonathan Yu,Jonathan Yu   Account Number:  [id number]  Date Initiated:  11/19/2013  Documentation initiated by:  [facility]  Subjective/Objective Assessment:   osteomyelitis of the right fibula     Action/Plan:   HH, home IV abx   Anticipated DC Date:  11/21/2013   Anticipated DC Plan:  [place]  CM consult      Choice offered to / List presented to:  C-1 Patient        [place] arranged  HH-1 RN  IV Antibiotics      [place].   Status of service:  In process, will continue to follow Medicare Important Message given?   (If response is "NO", the following Medicare IM given date fields will be blank) Date Medicare IM given:   Date Additional Medicare IM given:    Discharge Disposition:    Per UR Regulation:  Reviewed for med. necessity/level of care/duration of stay  If discussed at [place] of Stay Meetings, dates discussed:    Comments:  11/19/13 10:18 AM Ricki Miller, RN BSN Case Manager Spoke with patient concerning home health needs at discharge. Choice offered. Patient selected [facility], referral called to Pearletha Forge RN, [place]. Patient will be on IV antibiotics for 6 weeks.

## 2013-11-19 NOTE — Consult Note (Signed)
[facility] for Infectious Disease    Date of Admission:  11/18/2013  Date of Consult:  11/19/2013  Reason for Consult: infected right fibula sp I and D and removal of anchors  Referring Physician: Dr. Sharol Given   HPI: Jonathan Yu is an 63 y.o. male with extensive surgical history. He had  traumatic arthritis of the right ankle. He has a valgus alignment widening of the medial joint spac  reduction and shortening of the fibula. Patient initially had an open fracture this underwent to surgical interventions he eventually underwent a third surgical intervention for reconstruction of the fibula. Due  progressive traumatic arthritis patient  Had  total ankle arthroplasty and reconstruction of the fibula.on 08/18/13  He then underwent  REPAIR SYNDESMOSIS DISRUPTION RIGHT ANKLE  On 09/08/13.  He developed wound breakdown with  with exposed hardware wound is 2 x 5 mm and underwent removal of hardware from right ankle on 10/07/13.   He had further wound breakdown and he underwent Excision of skin soft tissue muscle and bone as well as removal of 2 tight rope anchors on 11/18/13 with deep cultures having been taken.   He was on oral doxycycline for 2 weeks prior to the surgery.  Gram stain negative, cultures incubating.    Past Medical History  Diagnosis Date  . Allergy   . Hyperlipidemia     "no meds since losing 106# 2 yr ago" (10/06/2013)  . Walking pneumonia 1968  . OSA on CPAP     "don't wear mask much since losing 106# 2 yr ago" (10/06/2013)  . Type II diabetes mellitus     "no meds since losing 106# 2 yr ago" (10/06/2013)  . Hypertension 2012    "no meds since losing 106# 2 yr ago" (10/06/2013)  . DDD (degenerative disc disease), lumbar     Past Surgical History  Procedure Laterality Date  . Shoulder arthroscopy w/ rotator cuff repair Right 2006; 2009  . Ureteral stent placement  ~ 2010 X 2  . Ankle reconstruction Right 05/2011  . Total ankle arthroplasty Right 08/18/2013   Procedure: TOTAL ANKLE ARTHOPLASTY;  Surgeon: Newt Minion, MD;  Location: [place] [facility];  Service: Orthopedics;  Laterality: Right;  Right Total Ankle Arthroplasty, Revision Fibular Fracture  . Nasal septum surgery  1982  . Ankle fracture surgery Left 1973  . Orif ankle fracture Right 09/08/2013    Procedure: REPAIR SYNDESMOSIS DISRUPTION RIGHT ANKLE;  Surgeon: Newt Minion, MD;  Location: [facility];  Service: Orthopedics;  Laterality: Right;  . Tonsillectomy  1959  . Carpal tunnel release Bilateral 2001  . Hardware removal Right 10/07/2013    Procedure: RIGHT ANKLE HARDWARE REMOVAL;  Surgeon: Newt Minion, MD;  Location: [facility];  Service: Orthopedics;  Laterality: Right;  ergies:   Allergies  Allergen Reactions  . Sesame Oil Hives and Shortness Of Breath  . Percocet [Oxycodone-Acetaminophen] Rash  . Vicodin [Hydrocodone-Acetaminophen] Rash     Medications: I have reviewed patients current medications as documented in Epic Anti-infectives   Start     Dose/Rate Route Frequency Ordered Stop   11/19/13 2200  rifampin (RIFADIN) capsule 300 mg     300 mg Oral Every 12 hours 11/19/13 1657     11/19/13 1800  cefTRIAXone (ROCEPHIN) 2 g in dextrose 5 % 50 mL IVPB     2 g 100 mL/hr over 30 Minutes Intravenous Every 24 hours 11/19/13 1657     11/19/13 0630  vancomycin (VANCOCIN) IVPB 1000 mg/200  mL premix    Comments:  Anticipate 6 weeks of IV vancomycin. Please have pharmacy dose for 6 weeks of treatment.   1,000 mg 200 mL/hr over 60 Minutes Intravenous Every 12 hours 11/18/13 2050 11/19/13 0741   11/18/13 2200  piperacillin-tazobactam (ZOSYN) IVPB 3.375 g  Status:  Discontinued     3.375 g 12.5 mL/hr over 240 Minutes Intravenous 3 times per day 11/18/13 2050 11/19/13 1651   11/18/13 1927  gentamicin (GARAMYCIN) injection  Status:  Discontinued       As needed 11/18/13 1927 11/18/13 1931   11/18/13 1926  vancomycin (VANCOCIN) powder  Status:  Discontinued       As needed 11/18/13 1926 11/18/13 1931        Social History:  reports that he has been smoking [employer].  He has never used smokeless tobacco. He reports that he drinks alcohol. He reports that he does not use illicit drugs.  Family History  Problem Relation Age of Onset  . Cancer Mother 67    Lung    As in HPI and primary teams notes otherwise 12 point review of systems is negative  Blood pressure 109/62, pulse 79, temperature 97.9 F (36.6 C), temperature source Oral, resp. rate 18, height _0  (1.676 m), weight 240 lb (108.863 kg), SpO2 94.00%. General: Alert and awake, oriented x3, not in any acute distress. HEENT: anicteric sclera,EOMI, oropharynx clear and without exudate CVS regular rate, normal r,  no murmur rubs or gallops Chest: clear to auscultation bilaterally, no wheezing, rales or rhonchi Abdomen: soft nontender, nondistended, normal bowel sounds, Extremities:  Ankle with dressing Skin: no rashes Neuro: nonfocal, strength and sensation intact   Results for orders placed during the hospital encounter of 11/18/13 (from the past 48 hour(s))  CBC     Status: None   Collection Time    11/18/13  3:22 PM      Result Value Ref Range   WBC 7.9  4.0 - 10.5 K/uL   RBC 5.35  4.22 - 5.81 MIL/uL   Hemoglobin 15.4  13.0 - 17.0 g/dL   HCT 44.3  39.0 - 52.0 %   MCV 82.8  78.0 - 100.0 fL   MCH 28.8  26.0 - 34.0 pg   MCHC 34.8  30.0 - 36.0 g/dL   RDW 13.0  11.5 - 15.5 %   Platelets 230  150 - 400 K/uL  COMPREHENSIVE METABOLIC PANEL     Status: None   Collection Time    11/18/13  3:22 PM      Result Value Ref Range   Sodium 139  137 - 147 mEq/L   Potassium 4.1  3.7 - 5.3 mEq/L   Chloride 100  96 - 112 mEq/L   CO2 21  19 - 32 mEq/L   Glucose, Bld 93  70 - 99 mg/dL   BUN 20  6 - 23 mg/dL   Creatinine, Ser 0.73  0.50 - 1.35 mg/dL   Calcium 10.0  8.4 - 10.5 mg/dL   Total Protein 7.5  6.0 - 8.3 g/dL   Albumin 4.0  3.5 - 5.2 g/dL   AST 26  0 - 37 U/L   ALT 29  0 - 53 U/L   Alkaline Phosphatase 55  39 -  117 U/L   Total Bilirubin 1.2  0.3 - 1.2 mg/dL   GFR calc non Af Amer >90  >90 mL/min   GFR calc Af Amer >90  >90 mL/min   Comment: (  NOTE)     The eGFR has been calculated using the CKD EPI equation.     This calculation has not been validated in all clinical situations.     eGFR's persistently <90 mL/min signify possible Chronic Kidney     Disease.  PROTIME-INR     Status: None   Collection Time    11/18/13  3:22 PM      Result Value Ref Range   Prothrombin Time 12.4  11.6 - 15.2 seconds   INR 0.94  0.00 - 1.49  GLUCOSE, CAPILLARY     Status: None   Collection Time    11/18/13  3:51 PM      Result Value Ref Range   Glucose-Capillary 82  70 - 99 mg/dL  WOUND CULTURE     Status: None   Collection Time    11/18/13  7:26 PM      Result Value Ref Range   Specimen Description WOUND RIGHT ANKLE     Special Requests NONE     Gram Stain       Value: RARE WBC PRESENT,BOTH PMN AND MONONUCLEAR     NO SQUAMOUS EPITHELIAL CELLS SEEN     NO ORGANISMS SEEN     Performed at [facility]   Culture PENDING     Report Status PENDING    ANAEROBIC CULTURE     Status: None   Collection Time    11/18/13  7:26 PM      Result Value Ref Range   Specimen Description WOUND RIGHT ANKLE     Special Requests NONE     Gram Stain       Value: RARE WBC PRESENT,BOTH PMN AND MONONUCLEAR     NO SQUAMOUS EPITHELIAL CELLS SEEN     NO ORGANISMS SEEN     Performed at [facility]   Culture       Value: NO ANAEROBES ISOLATED; CULTURE IN PROGRESS FOR 5 DAYS     Performed at [facility]   Report Status PENDING    GLUCOSE, CAPILLARY     Status: Abnormal   Collection Time    11/18/13  7:38 PM      Result Value Ref Range   Glucose-Capillary 103 (*) 70 - 99 mg/dL      Component Value Date/Time   SDES WOUND RIGHT ANKLE 11/18/2013 1926   SDES WOUND RIGHT ANKLE 11/18/2013 1926   SPECREQUEST NONE 11/18/2013 1926   SPECREQUEST NONE 11/18/2013 1926   CULT PENDING 11/18/2013 1926   CULT  Value:  NO ANAEROBES ISOLATED; CULTURE IN PROGRESS FOR 5 DAYS Performed at [facility] 11/18/2013 1926   REPTSTATUS PENDING 11/18/2013 1926   REPTSTATUS PENDING 11/18/2013 1926   No results found.   Recent Results (from the past 720 hour(s))  WOUND CULTURE     Status: None   Collection Time    11/18/13  7:26 PM      Result Value Ref Range Status   Specimen Description WOUND RIGHT ANKLE   Final   Special Requests NONE   Final   Gram Stain     Final   Value: RARE WBC PRESENT,BOTH PMN AND MONONUCLEAR     NO SQUAMOUS EPITHELIAL CELLS SEEN     NO ORGANISMS SEEN     Performed at [facility]   Culture PENDING   Incomplete   Report Status PENDING   Incomplete  ANAEROBIC CULTURE     Status: None   Collection Time  11/18/13  7:26 PM      Result Value Ref Range Status   Specimen Description WOUND RIGHT ANKLE   Final   Special Requests NONE   Final   Gram Stain     Final   Value: RARE WBC PRESENT,BOTH PMN AND MONONUCLEAR     NO SQUAMOUS EPITHELIAL CELLS SEEN     NO ORGANISMS SEEN     Performed at [facility]   Culture     Final   Value: NO ANAEROBES ISOLATED; CULTURE IN PROGRESS FOR 5 DAYS     Performed at [facility]   Report Status PENDING   Incomplete     Impression/Recommendation  Active Problems:   Acute osteomyelitis of right fibula   Jonathan Yu is a 63 y.o. 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 He still has remaining Total ankle arthroplasty though this was not felt to be overtly infected itself   #1 OSteomyelitis of fibula +/- infection of hardware in ankle   --will change to vancomycin, ceftriaxone and rifampin, pending ID and sensis  --anticipate 8 weeks of parenteral abx + likely rifampin with close followup with [place] in ID and with Dr. Sharol Given  #2 Screening: check HIV and hepatitis panel   11/19/2013, 7:35 PM   Thank you so much for this interesting consult  Dr. Baxter Flattery  will be covering this weekend and is available for questions and will follow cultures, I will be back on Monday.  [facility] [phone] (pager) [phone] (office) 11/19/2013, 7:35 PM  [place] 11/19/2013, 7:35 PM

## 2013-11-19 NOTE — Progress Notes (Signed)
Patient ID: Jonathan Yu, male   DOB: 31-Jan-1951, 63 y.o.   MRN: [id number] Postoperative day 1 irrigation and debridement and placement of antibiotic beads with vancomycin and gentamicin for osteomyelitis of the right fibula. Patient is currently on IV vancomycin and Zosyn. Intraoperative cultures are pending. Will have any PICC line placed today. Plan for infectious disease consult for 6 weeks of IV antibiotics

## 2013-11-20 LAB — SEDIMENTATION RATE: Sed Rate: 12 mm/hr (ref 0–16)

## 2013-11-20 LAB — HEPATITIS PANEL, ACUTE
HCV Ab: NEGATIVE
Hep A IgM: NONREACTIVE
Hep B C IgM: NONREACTIVE
Hepatitis B Surface Ag: NEGATIVE

## 2013-11-20 LAB — C-REACTIVE PROTEIN: CRP: 1.2 mg/dL — ABNORMAL HIGH (ref <0.60)

## 2013-11-20 NOTE — Progress Notes (Signed)
Subjective: Pt stable - pain ok vac on   Objective: Vital signs in last 24 hours: Temp:  [97.9 F (36.6 C)-98.4 F (36.9 C)] 98.1 F (36.7 C) (04/11 4401) Pulse Rate:  [76-79] 76 (04/11 0614) Resp:  [18] 18 (04/11 0614) BP: (108-132)/(62-75) 132/63 mmHg (04/11 0614) SpO2:  [92 %-98 %] 98 % (04/11 0614)  Intake/Output from previous day: 04/10 0701 - 04/11 0700 In: 1440 [P.O.:1200; I.V.:240] Out: 900 [Urine:850; Drains:50] Intake/Output this shift:    Exam:  Dorsiflexion/Plantar flexion intact Compartment soft  Labs:  Recent Labs  11/18/13 1522  HGB 15.4    Recent Labs  11/18/13 1522  WBC 7.9  RBC 5.35  HCT 44.3  PLT 230    Recent Labs  11/18/13 1522  NA 139  K 4.1  CL 100  CO2 21  BUN 20  CREATININE 0.73  GLUCOSE 93  CALCIUM 10.0    Recent Labs  11/18/13 1522  INR 0.94    Assessment/Plan: Plan to cont iv abx - thx to id for input to narrow coverage - dc next week   Meredith Pel 11/20/2013, 7:26 AM

## 2013-11-21 LAB — WOUND CULTURE

## 2013-11-21 NOTE — Progress Notes (Signed)
Subjective: 3 Days Post-Op Procedure(s) (LRB): IRRIGATION AND DEBRIDEMENT EXTREMITY (Right) Patient reports pain as mild.    Objective: Vital signs in last 24 hours: Temp:  [97.9 F (36.6 C)-98 F (36.7 C)] 97.9 F (36.6 C) (04/12 0611) Pulse Rate:  [69-72] 69 (04/12 0611) Resp:  [16-18] 16 (04/12 0611) BP: (105-132)/(61-71) 105/61 mmHg (04/12 0611) SpO2:  [95 %] 95 % (04/12 0611)  Intake/Output from previous day: 04/11 0701 - 04/12 0700 In: 680 [P.O.:680] Out: 500 [Urine:500] Intake/Output this shift: Total I/O In: 360 [P.O.:360] Out: 0    Recent Labs  11/18/13 1522  HGB 15.4    Recent Labs  11/18/13 1522  WBC 7.9  RBC 5.35  HCT 44.3  PLT 230    Recent Labs  11/18/13 1522  NA 139  K 4.1  CL 100  CO2 21  BUN 20  CREATININE 0.73  GLUCOSE 93  CALCIUM 10.0    Recent Labs  11/18/13 1522  INR 0.94    Neurologically intact  Assessment/Plan: 3 Days Post-Op Procedure(s) (LRB): IRRIGATION AND DEBRIDEMENT EXTREMITY (Right)   Continue IV ABX  For infection  Marybelle Killings 11/21/2013, 2:31 PM

## 2013-11-22 ENCOUNTER — Encounter (HOSPITAL_COMMUNITY): Payer: Self-pay | Admitting: Orthopedic Surgery

## 2013-11-22 DIAGNOSIS — Y831 Surgical operation with implant of artificial internal device as the cause of abnormal reaction of the patient, or of later complication, without mention of misadventure at the time of the procedure: Secondary | ICD-10-CM

## 2013-11-22 LAB — HIV-1 RNA QUANT-NO REFLEX-BLD
HIV 1 RNA Quant: <20 copies/mL (ref <20)
HIV-1 RNA Quant, Log: <1.3 {Log} (ref <1.30)

## 2013-11-22 MED ORDER — HYDROMORPHONE HCL 2 MG PO TABS: 2.0000 mg | ORAL_TABLET | ORAL | Status: DC | PRN

## 2013-11-22 MED ORDER — RIFAMPIN 300 MG PO CAPS: 300.0000 mg | ORAL_CAPSULE | Freq: Two times a day (BID) | ORAL | Status: DC

## 2013-11-22 MED ORDER — HYDROMORPHONE HCL 2 MG PO TABS
2.0000 mg | ORAL_TABLET | ORAL | Status: DC | PRN
  Administered 2013-11-22: 2 mg via ORAL
  Filled 2013-11-22 (×2): qty 1

## 2013-11-22 MED ORDER — HEPARIN SOD (PORK) LOCK FLUSH 100 UNIT/ML IV SOLN
250.0000 [IU] | INTRAVENOUS | Status: AC | PRN
  Administered 2013-11-22: 500 [IU]

## 2013-11-22 MED ORDER — DEXTROSE 5 % IV SOLN: 2.0000 g | INTRAVENOUS | Status: DC

## 2013-11-22 MED ORDER — VANCOMYCIN HCL IN DEXTROSE 1-5 GM/200ML-% IV SOLN
1000.0000 mg | Freq: Two times a day (BID) | INTRAVENOUS | Status: DC
  Administered 2013-11-22: 1000 mg via INTRAVENOUS
  Filled 2013-11-22 (×2): qty 200

## 2013-11-22 MED ORDER — VANCOMYCIN HCL IN DEXTROSE 1-5 GM/200ML-% IV SOLN: 1000.0000 mg | Freq: Two times a day (BID) | INTRAVENOUS | Status: DC

## 2013-11-22 NOTE — Discharge Summary (Signed)
Physician Discharge Summary  Patient ID: Jonathan Yu MRN: [id number] DOB/AGE: [birth year] 61 y.o.  Admit date: 11/18/2013 Discharge date: 11/22/2013  Admission Diagnoses: Osteomyelitis right fibula  Discharge Diagnoses:  Active Problems:   Acute osteomyelitis of right fibula   Discharged Condition: stable  Hospital Course: Patient's hospital course was essentially unremarkable. He underwent irrigation debridement of skin soft tissue muscle and bone. Placement of antibiotic beads placement of wound VAC. Infectious disease was consulted and patient was discharged on 8 weeks of IV antibiotics and oral antibiotics.  Consults: ID  Significant Diagnostic Studies: labs: Routine labs  Treatments: surgery: See operative note  Discharge Exam: Blood pressure 128/75, pulse 70, temperature 98.3 F (36.8 C), temperature source Oral, resp. rate 18, height 5\' 6"  (1.676 m), weight 108.863 kg (240 lb), SpO2 98.00%. Incision/Wound: incision clean dry and intact at time of discharge  Disposition: 01-Home or Self Care  Discharge Orders   Future Appointments Provider Department Dept Phone   12/21/2013 9:30 AM Marcial Pacas, DO [facility] [place] [phone]   Future Orders Complete By Expires   Call MD / Call 911  As directed    Constipation Prevention  As directed    Diet - low sodium heart healthy  As directed    Increase activity slowly as tolerated  As directed        Medication List    STOP taking these medications       doxycycline 50 MG capsule  Commonly known as:  VIBRAMYCIN      TAKE these medications       BELVIQ 10 MG Tabs  Generic drug:  Lorcaserin HCl  Take 1 tablet by mouth 2 (two) times daily.     cefTRIAXone 2 g in dextrose 5 % 50 mL  Inject 2 g into the vein daily.     FISH OIL PO  Take 1 capsule by mouth daily.     HYDROmorphone 2 MG tablet  Commonly known as:  DILAUDID  Take 1 tablet (2 mg total) by mouth every 4 (four) hours as  needed for severe pain.     ibuprofen 200 MG tablet  Commonly known as:  ADVIL,MOTRIN  Take 800 mg by mouth every 6 (six) hours as needed for moderate pain.     loratadine 10 MG tablet  Commonly known as:  CLARITIN  Take 10 mg by mouth daily.     multivitamin with minerals Tabs tablet  Take 1 tablet by mouth daily.     mupirocin ointment 2 %  Commonly known as:  BACTROBAN  Apply 1 application topically 2 (two) times daily.     OVER THE COUNTER MEDICATION  Take 1 tablet by mouth 4 (four) times daily - after meals and at bedtime. -pH balance formula Alkolete Yoli     rifampin 300 MG capsule  Commonly known as:  RIFADIN  Take 1 capsule (300 mg total) by mouth every 12 (twelve) hours.     vancomycin 1 GM/200ML Soln  Commonly known as:  VANCOCIN  Inject 200 mLs (1,000 mg total) into the vein every 12 (twelve) hours.     vitamin C 500 MG tablet  Commonly known as:  ASCORBIC ACID  Take 500 mg by mouth daily.           Follow-up Information   Follow up with Lilly Gasser V, MD In 1 week.   Specialty:  Orthopedic Surgery   Contact information:   [place] [address]  [phone]       Signed: Newt Minion 11/22/2013, 5:27 PM

## 2013-11-22 NOTE — Progress Notes (Signed)
Patient ID: Jonathan Yu, male   DOB: 07/01/1951, 63 y.o.   MRN: [id number] Plan for discharge to home today pending confirmation from infectious disease of antibiotics to be discharged with.  Infectious disease recommends vancomycin ceftriaxone and rifampin.  Patient is currently only on ceftriaxone and rifampin.  Will need the vancomycin ordered for discharge if this is recommended by infectious disease.

## 2013-11-22 NOTE — Progress Notes (Signed)
[place] for Infectious Disease    Subjective: No new complaints   Antibiotics:  Anti-infectives   Start     Dose/Rate Route Frequency Ordered Stop   11/22/13 0800  vancomycin (VANCOCIN) IVPB 1000 mg/200 mL premix    Comments:  Please dose for discharge to home for 8 weeks of IV vancomycin   1,000 mg 200 mL/hr over 60 Minutes Intravenous Every 12 hours 11/22/13 0648     11/22/13 0000  cefTRIAXone 2 g in dextrose 5 % 50 mL     2 g 100 mL/hr over 30 Minutes Intravenous Every 24 hours 11/22/13 0636     11/22/13 0000  rifampin (RIFADIN) 300 MG capsule     300 mg Oral Every 12 hours 11/22/13 0636     11/22/13 0000  vancomycin (VANCOCIN) 1 GM/200ML SOLN     1,000 mg 200 mL/hr over 60 Minutes Intravenous Every 12 hours 11/22/13 0649     11/19/13 2200  rifampin (RIFADIN) capsule 300 mg     300 mg Oral Every 12 hours 11/19/13 1657     11/19/13 1800  cefTRIAXone (ROCEPHIN) 2 g in dextrose 5 % 50 mL IVPB  Status:  Discontinued     2 g 100 mL/hr over 30 Minutes Intravenous Every 24 hours 11/19/13 1657 11/22/13 0944   11/19/13 0630  vancomycin (VANCOCIN) IVPB 1000 mg/200 mL premix    Comments:  Anticipate 6 weeks of IV vancomycin. Please have pharmacy dose for 6 weeks of treatment.   1,000 mg 200 mL/hr over 60 Minutes Intravenous Every 12 hours 11/18/13 2050 11/19/13 0741   11/18/13 2200  piperacillin-tazobactam (ZOSYN) IVPB 3.375 g  Status:  Discontinued     3.375 g 12.5 mL/hr over 240 Minutes Intravenous 3 times per day 11/18/13 2050 11/19/13 1651   11/18/13 1927  gentamicin (GARAMYCIN) injection  Status:  Discontinued       As needed 11/18/13 1927 11/18/13 1931   11/18/13 1926  vancomycin (VANCOCIN) powder  Status:  Discontinued       As needed 11/18/13 1926 11/18/13 1931      Medications: Scheduled Meds: . rifampin  300 mg Oral Q12H  . testosterone cypionate  200 mg Intramuscular Q30 days  . vancomycin  1,000 mg Intravenous Q12H   Continuous Infusions: . sodium  chloride 20 mL/hr at 11/18/13 2105   PRN Meds:.diphenhydrAMINE, HYDROmorphone (DILAUDID) injection, HYDROmorphone, metoCLOPramide (REGLAN) injection, metoCLOPramide, ondansetron (ZOFRAN) IV, ondansetron, sodium chloride, traMADol    Objective: Weight change:   Intake/Output Summary (Last 24 hours) at 11/22/13 1315 Last data filed at 11/21/13 2048  Gross per 24 hour  Intake    240 ml  Output    400 ml  Net   -160 ml   Blood pressure 128/75, pulse 70, temperature 98.3 F (36.8 C), temperature source Oral, resp. rate 18, height 5\' 6"  (1.676 m), weight 240 lb (108.863 kg), SpO2 98.00%. Temp:  [98 F (36.7 C)-98.3 F (36.8 C)] 98.3 F (36.8 C) (04/13 0700) Pulse Rate:  [60-70] 70 (04/13 0700) Resp:  [18] 18 (04/13 0700) BP: (128-129)/(66-75) 128/75 mmHg (04/13 0700) SpO2:  [93 %-98 %] 98 % (04/12 2047)  Physical Exam: General: Alert and awake, oriented x3, not in any acute distress. HEENT: anicteric sclera, pupils reactive to light and accommodation, EOMI CVS regular rate, normal r,  no murmur rubs or gallops Chest: clear to auscultation bilaterally, no wheezing, rales or rhonchi Abdomen: soft nontender, nondistended, normal bowel sounds, Extremities: no  clubbing or edema  noted bilaterally Skin: no rashes Lymph: no new lymphadenopathy Neuro: nonfocal  CBC:  Recent Labs Lab 11/18/13 1522  HGB 15.4  HCT 44.3  PLT 230  INR 0.94     BMET No results found for this basename: NA, K, CL, CO2, GLUCOSE, BUN, CREATININE, CALCIUM,  in the last 72 hours   Liver Panel  No results found for this basename: PROT, ALBUMIN, AST, ALT, ALKPHOS, BILITOT, BILIDIR, IBILI,  in the last 72 hours     Sedimentation Rate  Recent Labs  11/20/13 0532  ESRSEDRATE 12   C-Reactive Protein  Recent Labs  11/20/13 0532  CRP 1.2*    Micro Results: Recent Results (from the past 240 hour(s))  WOUND CULTURE     Status: None   Collection Time    11/18/13  7:26 PM      Result Value  Ref Range Status   Specimen Description WOUND RIGHT ANKLE   Final   Special Requests NONE   Final   Gram Stain     Final   Value: RARE WBC PRESENT,BOTH PMN AND MONONUCLEAR     NO SQUAMOUS EPITHELIAL CELLS SEEN     NO ORGANISMS SEEN     Performed at [facility]   Culture     Final   Value: FEW STAPHYLOCOCCUS SPECIES (COAGULASE NEGATIVE)     Performed at [facility]   Report Status 11/21/2013 FINAL   Final  ANAEROBIC CULTURE     Status: None   Collection Time    11/18/13  7:26 PM      Result Value Ref Range Status   Specimen Description WOUND RIGHT ANKLE   Final   Special Requests NONE   Final   Gram Stain     Final   Value: RARE WBC PRESENT,BOTH PMN AND MONONUCLEAR     NO SQUAMOUS EPITHELIAL CELLS SEEN     NO ORGANISMS SEEN     Performed at [facility]   Culture     Final   Value: NO ANAEROBES ISOLATED; CULTURE IN PROGRESS FOR 5 DAYS     Performed at [facility]   Report Status PENDING   Incomplete    Studies/Results: No results found.    Assessment/Plan:  Active Problems:   Acute osteomyelitis of right fibula    Jonathan Yu is a 63 y.o. 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 He still has remaining Total ankle arthroplasty though this was not felt to be overtly infected itself. Cultures yielded COag negative staph  #1 OSteomyelitis of fibula +/- infection of hardware in ankle  --narrwow to vancomycin, and rifampin, --anticipate 8 weeks of parenteral abx + likely rifampin with close followup with [place] in ID and with Dr. Sharol Given  I will arrange [facility] in my clinic next 2-3 weeks.     LOS: 4 days   Truman Hayward 11/22/2013, 1:15 PM

## 2013-11-23 LAB — ANAEROBIC CULTURE

## 2013-12-20 ENCOUNTER — Encounter: Payer: Self-pay | Admitting: Infectious Disease

## 2013-12-21 ENCOUNTER — Ambulatory Visit (INDEPENDENT_AMBULATORY_CARE_PROVIDER_SITE_OTHER): Payer: 59 | Admitting: Family Medicine

## 2013-12-21 ENCOUNTER — Encounter: Payer: Self-pay | Admitting: Family Medicine

## 2013-12-21 VITALS — BP 134/82 | HR 83 | Wt 266.0 lb

## 2013-12-21 DIAGNOSIS — E669 Obesity, unspecified: Secondary | ICD-10-CM

## 2013-12-21 DIAGNOSIS — M869 Osteomyelitis, unspecified: Secondary | ICD-10-CM

## 2013-12-21 NOTE — Progress Notes (Signed)
CC: Jonathan Yu is a 63 y.o. male is here for bp and wt check   Subjective: HPI:  Followup obesity: He stopped taking belviq 2 weeks after starting medication as he did not see any weight loss. He denies any known side effects or intolerance. He has not been able to engage in any physical activity due to multiple surgeries on his right lower extremity since I saw him last.  He would like to discuss his recent troubles with osteomyelitis in the right lower extremity. He has had to have hardware removed but still retains internal fixation hardware in his right ankle. We went over his recent hospitalization and progress notes from infectious disease and his orthopedic physician.  He denies any pain in his right lower extremity nor discharge over the past week and has been on ankle myosin on a daily basis managed by infectious disease. He has a PICC line in the right upper extremity and believes that he will be through with vancomycin in approximately one week. He denies fevers, chills, motor or sensory disturbances in the right lower extremity. He denies hearing loss or any rash.   Review Of Systems Outlined In HPI  Past Medical History  Diagnosis Date  . Allergy   . Hyperlipidemia     "no meds since losing 106# 2 yr ago" (10/06/2013)  . Walking pneumonia 1968  . OSA on CPAP     "don't wear mask much since losing 106# 2 yr ago" (10/06/2013)  . Type II diabetes mellitus     "no meds since losing 106# 2 yr ago" (10/06/2013)  . Hypertension 2012    "no meds since losing 106# 2 yr ago" (10/06/2013)  . DDD (degenerative disc disease), lumbar     Past Surgical History  Procedure Laterality Date  . Shoulder arthroscopy w/ rotator cuff repair Right 2006; 2009  . Ureteral stent placement  ~ 2010 X 2  . Ankle reconstruction Right 05/2011  . Total ankle arthroplasty Right 08/18/2013    Procedure: TOTAL ANKLE ARTHOPLASTY;  Surgeon: Newt Minion, MD;  Location: [place];  Service: Orthopedics;   Laterality: Right;  Right Total Ankle Arthroplasty, Revision Fibular Fracture  . Nasal septum surgery  1982  . Ankle fracture surgery Left 1973  . Orif ankle fracture Right 09/08/2013    Procedure: REPAIR SYNDESMOSIS DISRUPTION RIGHT ANKLE;  Surgeon: Newt Minion, MD;  Location: [place];  Service: Orthopedics;  Laterality: Right;  . Tonsillectomy  1959  . Carpal tunnel release Bilateral 2001  . Hardware removal Right 10/07/2013    Procedure: RIGHT ANKLE HARDWARE REMOVAL;  Surgeon: Newt Minion, MD;  Location: [place];  Service: Orthopedics;  Laterality: Right;  . I&d extremity Right 11/18/2013    Procedure: IRRIGATION AND DEBRIDEMENT EXTREMITY;  Surgeon: Newt Minion, MD;  Location: [place];  Service: Orthopedics;  Laterality: Right;  Irrigation and Debridement Right Fibula , Place Antibiotic Beads and VAC   Family History  Problem Relation Age of Onset  . Cancer Mother 61    Lung    History   Social History  . Marital Status: Divorced    Spouse Name: N/A    Number of Children: 36  . Years of Education: N/A   Occupational History  . sales/ DJ    Social History Main Topics  . Smoking status: Current Some Day Smoker -- 6 years    Types: Pipe, Cigars  . Smokeless tobacco: Never Used     Comment: 10/06/2013 "use pipe  or cigar 1-2 times/month"  . Alcohol Use: Yes     Comment: 10/06/2013 "have 4-5 drinks/yr"  . Drug Use: No  . Sexual Activity: Yes     Comment: 1 every 2 months   Other Topics Concern  . Not on file   Social History Narrative  . No narrative on file     Objective: BP 134/82  Pulse 83  Wt 266 lb (120.657 kg)  General: Alert and Oriented, No Acute Distress HEENT: Pupils equal, round, reactive to light. Conjunctivae clear. Moist mucous membranes pharynx unremarkable Lungs: Clear to auscultation bilaterally, no wheezing/ronchi/rales.  Comfortable work of breathing. Good air movement. Cardiac: Regular rate and rhythm. Normal S1/S2.  No murmurs, rubs, nor gallops.    Abdomen: Obese soft nontender Extremities: No peripheral edema.  Strong peripheral pulses. Well healing surgical scar extending from approximately the head of the fibula to the lateral malleolus on the right lower extremity without any discharge and only mild erythema at the surgical site. Neurovascularly intact in the right lower extremity Mental Status: No depression, anxiety, nor agitation. Skin: Warm and dry. Right PICC line shows no signs of infection, no tenderness redness nor discharge at the site of insertion  Assessment & Plan: Amin was seen today for bp and wt check.  Diagnoses and associated orders for this visit:  Obesity  Osteomyelitis    obesity: Uncontrolled discussed that belviq does not typically show signs of weight loss in the first 2 weeks and patient's typically need to wait one to 2 months before weight loss is apparent. We both agree that his weight loss will be best managed once he is able to get physically active and he is cleared from an orthopedic standpoint with respect to his right lower family. I've offered to coordinate physical therapy for him is his orthopedist does not do so in order to guide the patient with a safe return to the gym Osteomyelitis: Discussed with patient I believe he is on the proper course of treatment and that he is feeling well, continue to follow orthopedic and infectious disease recommendations  25 minutes spent face-to-face during visit today of which at least 50% was counseling or coordinating care regarding: 1. Obesity   2. Osteomyelitis       Return if symptoms worsen or fail to improve.

## 2013-12-27 ENCOUNTER — Telehealth: Payer: Self-pay | Admitting: *Deleted

## 2013-12-27 NOTE — Telephone Encounter (Signed)
I would like him to be on oral bactrim DS one tablet twice daily along with continue rifampin 300mg  twice daily

## 2013-12-27 NOTE — Telephone Encounter (Signed)
Patient Advanced nurse susan called and advised that the patient PICC came out on Saturday and he only had 3 doses of his medication left. She wanted to let the doctor know and ask if the patient should be seen sooner or if he wants him on oral antibiotics. Advised her will ask the doctor and give the patient a call once he answers.

## 2013-12-29 ENCOUNTER — Other Ambulatory Visit: Payer: Self-pay | Admitting: *Deleted

## 2013-12-29 DIAGNOSIS — M86169 Other acute osteomyelitis, unspecified tibia and fibula: Secondary | ICD-10-CM

## 2013-12-29 MED ORDER — SULFAMETHOXAZOLE-TMP DS 800-160 MG PO TABS: 1.0000 | ORAL_TABLET | Freq: Two times a day (BID) | ORAL | Status: DC

## 2013-12-29 MED ORDER — RIFAMPIN 300 MG PO CAPS: 300.0000 mg | ORAL_CAPSULE | Freq: Two times a day (BID) | ORAL | Status: DC

## 2013-12-29 NOTE — Telephone Encounter (Signed)
Per Dr Tommy Medal called the patient and advised him we are aware that his PICC came out on Saturday. Dr Tommy Medal wants him to continue his Rifampin and also add Bactrim DS. Verified his pharmacy and advised him to keep his appt next week 01/05/14.

## 2013-12-31 ENCOUNTER — Encounter: Payer: Self-pay | Admitting: Infectious Disease

## 2014-01-05 ENCOUNTER — Ambulatory Visit (INDEPENDENT_AMBULATORY_CARE_PROVIDER_SITE_OTHER): Payer: 59 | Admitting: Infectious Disease

## 2014-01-05 ENCOUNTER — Telehealth: Payer: Self-pay | Admitting: Family Medicine

## 2014-01-05 ENCOUNTER — Encounter: Payer: Self-pay | Admitting: Infectious Disease

## 2014-01-05 VITALS — BP 125/71 | HR 68 | Temp 98.1°F | Wt 268.0 lb

## 2014-01-05 DIAGNOSIS — T847XXA Infection and inflammatory reaction due to other internal orthopedic prosthetic devices, implants and grafts, initial encounter: Secondary | ICD-10-CM

## 2014-01-05 DIAGNOSIS — M86169 Other acute osteomyelitis, unspecified tibia and fibula: Secondary | ICD-10-CM

## 2014-01-05 DIAGNOSIS — S82891A Other fracture of right lower leg, initial encounter for closed fracture: Secondary | ICD-10-CM

## 2014-01-05 DIAGNOSIS — B957 Other staphylococcus as the cause of diseases classified elsewhere: Secondary | ICD-10-CM

## 2014-01-05 LAB — CBC WITH DIFFERENTIAL/PLATELET
Basophils Absolute: 0.1 10*3/uL (ref 0.0–0.1)
Basophils Relative: 1 % (ref 0–1)
Eosinophils Absolute: 0.3 10*3/uL (ref 0.0–0.7)
Eosinophils Relative: 5 % (ref 0–5)
HCT: 38.4 % — ABNORMAL LOW (ref 39.0–52.0)
Hemoglobin: 13 g/dL (ref 13.0–17.0)
Lymphocytes Relative: 38 % (ref 12–46)
Lymphs Abs: 2.2 10*3/uL (ref 0.7–4.0)
MCH: 27.6 pg (ref 26.0–34.0)
MCHC: 33.9 g/dL (ref 30.0–36.0)
MCV: 81.5 fL (ref 78.0–100.0)
Monocytes Absolute: 0.6 10*3/uL (ref 0.1–1.0)
Monocytes Relative: 10 % (ref 3–12)
Neutro Abs: 2.7 10*3/uL (ref 1.7–7.7)
Neutrophils Relative %: 46 % (ref 43–77)
Platelets: 229 10*3/uL (ref 150–400)
RBC: 4.71 MIL/uL (ref 4.22–5.81)
RDW: 15 % (ref 11.5–15.5)
WBC: 5.8 10*3/uL (ref 4.0–10.5)

## 2014-01-05 LAB — COMPLETE METABOLIC PANEL WITH GFR
ALT: 20 U/L (ref 0–53)
AST: 17 U/L (ref 0–37)
Albumin: 3.8 g/dL (ref 3.5–5.2)
Alkaline Phosphatase: 71 U/L (ref 39–117)
BUN: 16 mg/dL (ref 6–23)
CO2: 28 mEq/L (ref 19–32)
Calcium: 9.2 mg/dL (ref 8.4–10.5)
Chloride: 104 mEq/L (ref 96–112)
Creat: 0.92 mg/dL (ref 0.50–1.35)
GFR, Est African American: >89 mL/min
GFR, Est Non African American: 89 mL/min
Glucose, Bld: 84 mg/dL (ref 70–99)
Potassium: 4.5 mEq/L (ref 3.5–5.3)
Sodium: 139 mEq/L (ref 135–145)
Total Bilirubin: 0.5 mg/dL (ref 0.2–1.2)
Total Protein: 6.6 g/dL (ref 6.0–8.3)

## 2014-01-05 LAB — C-REACTIVE PROTEIN: CRP: <0.5 mg/dL (ref <0.60)

## 2014-01-05 NOTE — Progress Notes (Signed)
   Subjective:    Patient ID: Jonathan Yu, male    DOB: 1951-08-11, 63 y.o.   MRN: [id number]  HPI  63 y.o. male with Osteomyelitis hardware associated infection in fibula sp I and D and removal of anchors. He still has remaining Total ankle arthroplasty though this was not felt to be overtly infected itself. Cultures yielded COag negative staphylococcus. He was on course to complete 8 weeks of IV vancomycin with oral rifampin and he finished all of these except for one day when PICC failed. Changed to bactrim DS bid and rifampin and continued on these. WOund doing well and Dr. Sharol Given happy with progress. He is now weight bearing. He is with minimal pain at surgical site no fevers, chills or malaise.     Review of Systems  Constitutional: Negative for fever, chills, diaphoresis, activity change, appetite change, fatigue and unexpected weight change.  HENT: Negative for congestion, rhinorrhea, sinus pressure, sneezing, sore throat and trouble swallowing.   Eyes: Negative for photophobia and visual disturbance.  Respiratory: Negative for cough, chest tightness, shortness of breath, wheezing and stridor.   Cardiovascular: Negative for chest pain, palpitations and leg swelling.  Gastrointestinal: Negative for nausea, vomiting, abdominal pain, diarrhea, constipation, blood in stool, abdominal distention and anal bleeding.  Genitourinary: Negative for dysuria, hematuria, flank pain and difficulty urinating.  Musculoskeletal: Negative for arthralgias, back pain, gait problem, joint swelling and myalgias.  Skin: Positive for wound. Negative for color change, pallor and rash.  Neurological: Negative for dizziness, tremors, weakness and light-headedness.  Hematological: Negative for adenopathy. Does not bruise/bleed easily.  Psychiatric/Behavioral: Negative for behavioral problems, confusion, sleep disturbance, dysphoric mood, decreased concentration and agitation.       Objective:   Physical Exam    Nursing note and vitals reviewed. Constitutional: He is oriented to person, place, and time. He appears well-developed and well-nourished. No distress.  HENT:  Head: Normocephalic and atraumatic.  Mouth/Throat: Oropharynx is clear and moist. No oropharyngeal exudate.  Eyes: Conjunctivae and EOM are normal. No scleral icterus.  Neck: Normal range of motion. Neck supple. No JVD present.  Cardiovascular: Normal rate, regular rhythm and normal heart sounds.   No murmur heard. Pulmonary/Chest: Effort normal. No respiratory distress. He has no wheezes.  Abdominal: Soft. He exhibits no distension. There is no tenderness.  Musculoskeletal: He exhibits edema. He exhibits no tenderness.       Feet:  Lymphadenopathy:    He has no cervical adenopathy.  Neurological: He is alert and oriented to person, place, and time. He exhibits normal muscle tone. Coordination normal.  Skin: Skin is warm and dry. He is not diaphoretic. No erythema. No pallor.  Psychiatric: He has a normal mood and affect. His behavior is normal. Judgment and thought content normal.    Surgical site healing well no purulence:          Assessment & Plan:  \Osteomyelitis hardware associated infection in fibula sp I and D and removal of anchors. He still has remaining Total ankle arthroplasty though this was not felt to be overtly infected itself. Cultures yielded COag negative staph sp 8 weeks of IV vanco and rifampin (though had difficulty getting therapeutic levels of vancomycin and was on up 6 grams a day at one point.  I have still some anxiety re the total ankle artrhoplasty  Recheck ESR, CRp cbc cmp    Continue bactrim bid and rifampin and rtc in 2 months will likely extend for total of 6 months if not longer

## 2014-01-05 NOTE — Telephone Encounter (Signed)
Patient called and states that his insurance requires him to get a NEW referral for him to see Dr.Duda, [facility]vere[facility]- [facility] for a surgery follow-up. Patient is also needing a new referral for Dr.Cornelius VanDam with Infectious Disease in [place].

## 2014-01-05 NOTE — Telephone Encounter (Signed)
Jonathan Yu, Referrals placed up front with [facility]

## 2014-01-06 LAB — SEDIMENTATION RATE: Sed Rate: 5 mm/hr (ref 0–16)

## 2014-03-07 ENCOUNTER — Ambulatory Visit: Payer: 59 | Admitting: Infectious Disease

## 2014-03-09 ENCOUNTER — Encounter: Payer: Self-pay | Admitting: Infectious Disease

## 2014-03-09 ENCOUNTER — Ambulatory Visit (INDEPENDENT_AMBULATORY_CARE_PROVIDER_SITE_OTHER): Payer: Medicare PPO | Admitting: Infectious Disease

## 2014-03-09 VITALS — BP 127/84 | HR 65 | Temp 98.0°F | Wt 255.0 lb

## 2014-03-09 DIAGNOSIS — T847XXS Infection and inflammatory reaction due to other internal orthopedic prosthetic devices, implants and grafts, sequela: Secondary | ICD-10-CM

## 2014-03-09 DIAGNOSIS — T889XXS Complication of surgical and medical care, unspecified, sequela: Secondary | ICD-10-CM

## 2014-03-09 DIAGNOSIS — B957 Other staphylococcus as the cause of diseases classified elsewhere: Secondary | ICD-10-CM

## 2014-03-09 LAB — C-REACTIVE PROTEIN: CRP: <0.5 mg/dL (ref <0.60)

## 2014-03-09 LAB — SEDIMENTATION RATE: Sed Rate: 4 mm/hr (ref 0–16)

## 2014-03-09 NOTE — Progress Notes (Signed)
 Subjective:    Patient ID: Jonathan Yu, male    DOB: 05/23/1951, 63 y.o.   MRN: [id number]  HPI   63 y.o. male with Osteomyelitis hardware associated infection in fibula sp I and D and removal of anchors. He still has remaining Total ankle arthroplasty though this was not felt to be overtly infected itself. Cultures yielded COag negative staphylococcus. He was on course to complete 8 weeks of IV vancomycin with oral rifampin and he finished all of these except for one day when PICC failed. Changed to bactrim DS bid and rifampin and continued on these. WOund doing well and Dr. Duda happy with progress. I saw him in May and his ESR and CRP were encouraging and he was doing well. He took bactrim and rifampin for a month then doxy for a week and has been off all abx since then with no return of pain or systemic ssx.    Review of Systems  Constitutional: Negative for fever, chills, diaphoresis, activity change, appetite change, fatigue and unexpected weight change.  HENT: Negative for congestion, rhinorrhea, sinus pressure, sneezing, sore throat and trouble swallowing.   Eyes: Negative for photophobia and visual disturbance.  Respiratory: Negative for cough, chest tightness, shortness of breath, wheezing and stridor.   Cardiovascular: Negative for chest pain, palpitations and leg swelling.  Gastrointestinal: Negative for nausea, vomiting, abdominal pain, diarrhea, constipation, blood in stool, abdominal distention and anal bleeding.  Genitourinary: Negative for dysuria, hematuria, flank pain and difficulty urinating.  Musculoskeletal: Negative for arthralgias, back pain, gait problem, joint swelling and myalgias.  Skin: Positive for wound. Negative for color change, pallor and rash.  Neurological: Negative for dizziness, tremors, weakness and light-headedness.  Hematological: Negative for adenopathy. Does not bruise/bleed easily.  Psychiatric/Behavioral: Negative for behavioral problems,  confusion, sleep disturbance, dysphoric mood, decreased concentration and agitation.       Objective:   Physical Exam  Nursing note and vitals reviewed. Constitutional: He is oriented to person, place, and time. He appears well-developed and well-nourished. No distress.  HENT:  Head: Normocephalic and atraumatic.  Mouth/Throat: Oropharynx is clear and moist. No oropharyngeal exudate.  Eyes: Conjunctivae and EOM are normal. No scleral icterus.  Neck: Normal range of motion. Neck supple. No JVD present.  Cardiovascular: Normal rate, regular rhythm and normal heart sounds.   No murmur heard. Pulmonary/Chest: Effort normal. No respiratory distress. He has no wheezes.  Abdominal: Soft. He exhibits no distension. There is no tenderness.  Musculoskeletal: He exhibits edema. He exhibits no tenderness.       Feet:  Lymphadenopathy:    He has no cervical adenopathy.  Neurological: He is alert and oriented to person, place, and time. He exhibits normal muscle tone. Coordination normal.  Skin: Skin is warm and dry. He is not diaphoretic. No erythema. No pallor.  Psychiatric: He has a normal mood and affect. His behavior is normal. Judgment and thought content normal.    Surgical site healing well no purulence picutre in May:      Picture today:         Assessment & Plan:  \Osteomyelitis hardware associated infection in fibula sp I and D and removal of anchors. He still has remaining Total ankle arthroplasty though this was not felt to be overtly infected itself. Cultures yielded COag negative staph sp 8 weeks of IV vanco and rifampin (though had difficulty getting therapeutic levels of vancomycin and was on up 6 grams a day at one point. I DID and DO have still   some anxiety re the total ankle artrhoplasty but he appears to be doing well off abx  Recheck ESR, CRp today and RTC in 3 months time  I spent greater than 25 minutes with the patient including greater than 50% of time in face  to face counsel of the patient and in coordination of their care.     Continue bactrim bid and rifampin and rtc in 2 months will likely extend for total of 6 months if not longer 

## 2014-03-23 ENCOUNTER — Encounter: Payer: Self-pay | Admitting: Family Medicine

## 2014-03-23 ENCOUNTER — Ambulatory Visit (INDEPENDENT_AMBULATORY_CARE_PROVIDER_SITE_OTHER): Payer: Medicare PPO | Admitting: Family Medicine

## 2014-03-23 VITALS — BP 134/81 | HR 61 | Wt 256.0 lb

## 2014-03-23 DIAGNOSIS — I10 Essential (primary) hypertension: Secondary | ICD-10-CM

## 2014-03-23 DIAGNOSIS — E669 Obesity, unspecified: Secondary | ICD-10-CM

## 2014-03-23 DIAGNOSIS — E785 Hyperlipidemia, unspecified: Secondary | ICD-10-CM

## 2014-03-23 DIAGNOSIS — H16139 Photokeratitis, unspecified eye: Secondary | ICD-10-CM

## 2014-03-23 DIAGNOSIS — L570 Actinic keratosis: Secondary | ICD-10-CM

## 2014-03-23 DIAGNOSIS — Z23 Encounter for immunization: Secondary | ICD-10-CM

## 2014-03-23 DIAGNOSIS — R635 Abnormal weight gain: Secondary | ICD-10-CM

## 2014-03-23 MED ORDER — PHENTERMINE HCL 30 MG PO CAPS: 30.0000 mg | ORAL_CAPSULE | ORAL | Status: DC

## 2014-03-23 NOTE — Progress Notes (Signed)
CC: Jonathan Yu is a 63 y.o. male is here for Follow-up   Subjective: HPI:  Followup dyslipidemia: Patient continues to want to avoid taking a statin or any other cholesterol medication. He's been focusing on diet by minimizing carbs in his diet and reducing fat in his diet. Additionally portion control has been helping him with losing weight. However he believes that portion control is his biggest struggle. Denies chest pain shortness of breath limb claudication or right upper quadrant pain  Followup obesity and abnormal weight gain: Patient reports that over the past month or two he has been yo-yo-ing with respect to weight and ability to stick to a portion-controlled diet.  Is wondering if he can go back on phentermine to help with portion control. He just started going back to the gym this week has not had any pain in his right lower extremity.  Followup essential hypertension: No outside blood pressures report. He continues to focus on salt restriction and exercise to keep this in check. Denies motor or sensory disturbances peripheral edema nor orthopnea  He has a spot on his left chest just medial to the nipple that he believes has been present for a little over a year now. He tells me gets scaly and somewhat tender, no interventions as of yet. He does not be getting bigger or smaller over the past year. He denies discharge or bleeding.   Review Of Systems Outlined In HPI  Past Medical History  Diagnosis Date  . Allergy   . Hyperlipidemia     "no meds since losing 106# 2 yr ago" (10/06/2013)  . Walking pneumonia 1968  . OSA on CPAP     "don't wear mask much since losing 106# 2 yr ago" (10/06/2013)  . Type II diabetes mellitus     "no meds since losing 106# 2 yr ago" (10/06/2013)  . Hypertension 2012    "no meds since losing 106# 2 yr ago" (10/06/2013)  . DDD (degenerative disc disease), lumbar     Past Surgical History  Procedure Laterality Date  . Shoulder arthroscopy w/  rotator cuff repair Right 2006; 2009  . Ureteral stent placement  ~ 2010 X 2  . Ankle reconstruction Right 05/2011  . Total ankle arthroplasty Right 08/18/2013    Procedure: TOTAL ANKLE ARTHOPLASTY;  Surgeon: Newt Minion, MD;  Location: [place];  Service: Orthopedics;  Laterality: Right;  Right Total Ankle Arthroplasty, Revision Fibular Fracture  . Nasal septum surgery  1982  . Ankle fracture surgery Left 1973  . Orif ankle fracture Right 09/08/2013    Procedure: REPAIR SYNDESMOSIS DISRUPTION RIGHT ANKLE;  Surgeon: Newt Minion, MD;  Location: [place];  Service: Orthopedics;  Laterality: Right;  . Tonsillectomy  1959  . Carpal tunnel release Bilateral 2001  . Hardware removal Right 10/07/2013    Procedure: RIGHT ANKLE HARDWARE REMOVAL;  Surgeon: Newt Minion, MD;  Location: [place];  Service: Orthopedics;  Laterality: Right;  . I&d extremity Right 11/18/2013    Procedure: IRRIGATION AND DEBRIDEMENT EXTREMITY;  Surgeon: Newt Minion, MD;  Location: [place];  Service: Orthopedics;  Laterality: Right;  Irrigation and Debridement Right Fibula , Place Antibiotic Beads and VAC   Family History  Problem Relation Age of Onset  . Cancer Mother 87    Lung    History   Social History  . Marital Status: Divorced    Spouse Name: N/A    Number of Children: 73  . Years of Education: N/A  Occupational History  . sales/ DJ    Social History Main Topics  . Smoking status: Current Some Day Smoker -- 6 years    Types: Pipe, Cigars  . Smokeless tobacco: Never Used     Comment: 10/06/2013 "use pipe or cigar 1-2 times/month"  . Alcohol Use: Yes     Comment: 10/06/2013 "have 4-5 drinks/yr"  . Drug Use: No  . Sexual Activity: Yes     Comment: 1 every 2 months   Other Topics Concern  . Not on file   Social History Narrative  . No narrative on file     Objective: BP 134/81  Pulse 61  Wt 256 lb (116.121 kg)  General: Alert and Oriented, No Acute Distress HEENT: Pupils equal, round, reactive to  light. Conjunctivae clear.  Moist mucous membranes pharynx unremarkable Lungs: Clear to auscultation bilaterally, no wheezing/ronchi/rales.  Comfortable work of breathing. Good air movement. Cardiac: Regular rate and rhythm. Normal S1/S2.  No murmurs, rubs, nor gallops.   Abdomen: Obese and soft Extremities: No peripheral edema.  Strong peripheral pulses.  Mental Status: No depression, anxiety, nor agitation. Skin: Warm and dry. 1 cm diameter actinic keratosis noninflamed on the left chest just medial to the nipple without fluctuance or tenderness nor discharge  Assessment & Plan: Cartel was seen today for follow-up.  Diagnoses and associated orders for this visit:  Abnormal weight gain - phentermine 30 MG capsule; Take 1 capsule (30 mg total) by mouth every morning.  Actinic keratitis, unspecified laterality  Essential hypertension, benign  Obesity  Flu vaccine need - Flu Vaccine QUAD 36+ mos IM  Dyslipidemia    Abnormal weight gain in the setting of obesity: Restarting phentermine to help with portion control provided he continues to visit the gym most days of the week Actinic keratosis: Cryotherapy today, if not resolved in 3 months we'll consider biopsy Essential hypertension: Controlled chronic condition continue diet and exercise interventions Dyslipidemia: He politely declines a lipid panel today and would rather have this done at the next followup visit after he gets back into his daily gym routine   Return in about 4 weeks (around 04/20/2014) for Nurse Visit Weight Check.  Cryotherapy Procedure Note  Pre-operative Diagnosis: Actinic keratosis  Post-operative Diagnosis: Actinic keratosis  Locations: left chest, medial  Indications: pre-malignant  Anesthesia: none  Procedure Details  History of allergy to iodine: no. Pacemaker? no.  Patient informed of risks (permanent scarring, infection, light or dark discoloration, bleeding, infection, weakness, numbness  and recurrence of the lesion) and benefits of the procedure and verbal informed consent obtained.  The areas are treated with liquid nitrogen therapy, frozen until ice ball extended 3 mm beyond lesion, allowed to thaw, and treated again. The patient tolerated procedure well.  The patient was instructed on post-op care, warned that there may be blister formation, redness and pain. Recommend OTC analgesia as needed for pain.  Condition: Stable  Complications: none.  Plan: 1. Instructed to keep the area dry and covered for 24-48h and clean thereafter. 2. Warning signs of infection were reviewed.   3. Recommended that the patient use OTC analgesics as needed for pain.  4. Return in 3 months.

## 2014-04-22 ENCOUNTER — Ambulatory Visit (INDEPENDENT_AMBULATORY_CARE_PROVIDER_SITE_OTHER): Payer: Medicare PPO | Admitting: Family Medicine

## 2014-04-22 ENCOUNTER — Encounter: Payer: Self-pay | Admitting: *Deleted

## 2014-04-22 VITALS — BP 119/75 | HR 78 | Ht 66.0 in | Wt 256.0 lb

## 2014-04-22 DIAGNOSIS — R635 Abnormal weight gain: Secondary | ICD-10-CM

## 2014-04-22 MED ORDER — PHENTERMINE HCL 30 MG PO CAPS: 30.0000 mg | ORAL_CAPSULE | ORAL | Status: DC

## 2014-04-22 NOTE — Progress Notes (Signed)
   Subjective:    Patient ID: Jonathan Yu, male    DOB: 11-Jan-1951, 63 y.o.   MRN: [id number]  HPI [name] reported today for weight check and med refill for phentermine. He denies any CP or SOB and states he "feels great". [name], CMA   Review of Systems     Objective:   Physical Exam        Assessment & Plan:

## 2014-05-20 ENCOUNTER — Ambulatory Visit (INDEPENDENT_AMBULATORY_CARE_PROVIDER_SITE_OTHER): Payer: Medicare PPO | Admitting: Family Medicine

## 2014-05-20 ENCOUNTER — Encounter: Payer: Self-pay | Admitting: Family Medicine

## 2014-05-20 VITALS — BP 144/85 | HR 71 | Ht 67.0 in | Wt 255.0 lb

## 2014-05-20 DIAGNOSIS — R635 Abnormal weight gain: Secondary | ICD-10-CM

## 2014-05-20 MED ORDER — PHENTERMINE HCL 30 MG PO CAPS: 30.0000 mg | ORAL_CAPSULE | ORAL | Status: DC

## 2014-05-20 NOTE — Progress Notes (Signed)
Mild weight loss, refill provided.

## 2014-05-20 NOTE — Progress Notes (Signed)
   Subjective:    Patient ID: Jonathan Yu, male    DOB: November 24, 1950, 63 y.o.   MRN: [id number]  HPI Jonathan Yu reported to office today for weight and BP check to obtain phentermine refill. He denies CP, SOB or headaches at this time. He reports feeling somewhat fatigued but not ill. His BP was elevated at first and rechecked at end of visit where it came down to 132/80. [name], CMA    Review of Systems     Objective:   Physical Exam        Assessment & Plan:   Jonathan Yu was told to schedule appointment to see Dr. Ileene Rubens when next refill is due. He verbalized understanding.

## 2014-06-08 ENCOUNTER — Ambulatory Visit: Payer: Medicare PPO | Admitting: Infectious Disease

## 2014-06-10 ENCOUNTER — Encounter: Payer: Self-pay | Admitting: Infectious Disease

## 2014-06-10 ENCOUNTER — Ambulatory Visit (INDEPENDENT_AMBULATORY_CARE_PROVIDER_SITE_OTHER): Payer: Medicare PPO | Admitting: Infectious Disease

## 2014-06-10 VITALS — BP 129/88 | HR 83 | Temp 98.9°F | Wt 271.0 lb

## 2014-06-10 DIAGNOSIS — Z23 Encounter for immunization: Secondary | ICD-10-CM

## 2014-06-10 DIAGNOSIS — M009 Pyogenic arthritis, unspecified: Secondary | ICD-10-CM

## 2014-06-10 NOTE — Progress Notes (Signed)
Subjective:    Patient ID: Jonathan Yu, male    DOB: 12-31-1950, 63 y.o.   MRN: [id number]  HPI   63 y.o. male with Osteomyelitis hardware associated infection in fibula sp I and D and removal of anchors. He still has remaining Total ankle arthroplasty though this was not felt to be overtly infected itself. Cultures yielded COag negative staphylococcus. He was on course to complete 8 weeks of IV vancomycin with oral rifampin and he finished all of these except for one day when PICC failed. Changed to bactrim DS bid and rifampin and continued on these.    He took bactrim and rifampin for a month then doxy for a week and has been off all abx since then with no return of pain or systemic ssx.   He has now off antibiotics for many many months and has had no recurrence of symptoms of pain at rest or fever or malaise inflammatory markers have been normal.   Review of Systems  Constitutional: Negative for fever, chills, diaphoresis, activity change, appetite change, fatigue and unexpected weight change.  HENT: Negative for congestion, rhinorrhea, sinus pressure, sneezing, sore throat and trouble swallowing.   Eyes: Negative for photophobia and visual disturbance.  Respiratory: Negative for cough, chest tightness, shortness of breath, wheezing and stridor.   Cardiovascular: Negative for chest pain, palpitations and leg swelling.  Gastrointestinal: Negative for nausea, vomiting, abdominal pain, diarrhea, constipation, blood in stool, abdominal distention and anal bleeding.  Genitourinary: Negative for dysuria, hematuria, flank pain and difficulty urinating.  Musculoskeletal: Negative for arthralgias, back pain, gait problem, joint swelling and myalgias.  Skin: Positive for wound. Negative for color change, pallor and rash.  Neurological: Negative for dizziness, tremors, weakness and light-headedness.  Hematological: Negative for adenopathy. Does not bruise/bleed easily.  Psychiatric/Behavioral:  Negative for behavioral problems, confusion, sleep disturbance, dysphoric mood, decreased concentration and agitation.       Objective:   Physical Exam  Nursing note and vitals reviewed. Constitutional: He is oriented to person, place, and time. He appears well-developed and well-nourished. No distress.  HENT:  Head: Normocephalic and atraumatic.  Mouth/Throat: Oropharynx is clear and moist. No oropharyngeal exudate.  Eyes: Conjunctivae and EOM are normal. No scleral icterus.  Neck: Normal range of motion. Neck supple. No JVD present.  Cardiovascular: Normal rate, regular rhythm and normal heart sounds.   No murmur heard. Pulmonary/Chest: Effort normal. No respiratory distress. He has no wheezes.  Abdominal: Soft. He exhibits no distension. There is no tenderness.  Musculoskeletal: He exhibits edema. He exhibits no tenderness.       Feet:  Lymphadenopathy:    He has no cervical adenopathy.  Neurological: He is alert and oriented to person, place, and time. He exhibits normal muscle tone. Coordination normal.  Skin: Skin is warm and dry. He is not diaphoretic. No erythema. No pallor.  Psychiatric: He has a normal mood and affect. His behavior is normal. Judgment and thought content normal.    Surgical site healing well no purulence picutre in May:      Picture last visit     Ankle looks great today     Assessment & Plan:  Osteomyelitis hardware associated infection in fibula sp I and D and removal of anchors. He still has remaining Total ankle arthroplasty though this was not felt to be overtly infected itself. Cultures yielded COag negative staph sp 8 weeks of IV vanco and rifampin (though had difficulty getting therapeutic levels of vancomycin and was on up 6  grams a day at one point. I DID and DO have still some anxiety re the total ankle artrhoplasty but he appears to be doing well off abx  RTC prn    Continue bactrim bid and rifampin and rtc in 2 months will likely  extend for total of 6 months if not longer

## 2014-06-24 ENCOUNTER — Ambulatory Visit (INDEPENDENT_AMBULATORY_CARE_PROVIDER_SITE_OTHER): Payer: Medicare PPO | Admitting: Family Medicine

## 2014-06-24 ENCOUNTER — Encounter: Payer: Self-pay | Admitting: Family Medicine

## 2014-06-24 VITALS — BP 123/77 | HR 81 | Wt 267.0 lb

## 2014-06-24 DIAGNOSIS — E669 Obesity, unspecified: Secondary | ICD-10-CM

## 2014-06-24 MED ORDER — TOPIRAMATE 50 MG PO TABS: 50.0000 mg | ORAL_TABLET | Freq: Two times a day (BID) | ORAL | Status: DC

## 2014-06-24 NOTE — Progress Notes (Signed)
CC: Jonathan Yu is a 63 y.o. male is here for f/u phentermine   Subjective: HPI:  Follow-up obesity: Patient states that he believes phentermine is no longer effective at helping with portion control. It still causes difficulty with sleep. He denies any other side effects. He is having difficulty with losing weight despite calorie counting. He's having difficulty getting back into his normal exercise routine due to some mild persistent right ankle pain over the past months.   Denies fevers, chills, gastrointestinal complaints, skin complaints, genitourinary complaints.      Review Of Systems Outlined In HPI  Past Medical History  Diagnosis Date  . Allergy   . Hyperlipidemia     "no meds since losing 106# 2 yr ago" (10/06/2013)  . Walking pneumonia 1968  . OSA on CPAP     "don't wear mask much since losing 106# 2 yr ago" (10/06/2013)  . Type II diabetes mellitus     "no meds since losing 106# 2 yr ago" (10/06/2013)  . Hypertension 2012    "no meds since losing 106# 2 yr ago" (10/06/2013)  . DDD (degenerative disc disease), lumbar     Past Surgical History  Procedure Laterality Date  . Shoulder arthroscopy w/ rotator cuff repair Right 2006; 2009  . Ureteral stent placement  ~ 2010 X 2  . Ankle reconstruction Right 05/2011  . Total ankle arthroplasty Right 08/18/2013    Procedure: TOTAL ANKLE ARTHOPLASTY;  Surgeon: Newt Minion, MD;  Location: [place];  Service: Orthopedics;  Laterality: Right;  Right Total Ankle Arthroplasty, Revision Fibular Fracture  . Nasal septum surgery  1982  . Ankle fracture surgery Left 1973  . Orif ankle fracture Right 09/08/2013    Procedure: REPAIR SYNDESMOSIS DISRUPTION RIGHT ANKLE;  Surgeon: Newt Minion, MD;  Location: [place];  Service: Orthopedics;  Laterality: Right;  . Tonsillectomy  1959  . Carpal tunnel release Bilateral 2001  . Hardware removal Right 10/07/2013    Procedure: RIGHT ANKLE HARDWARE REMOVAL;  Surgeon: Newt Minion, MD;  Location:  [place];  Service: Orthopedics;  Laterality: Right;  . I&d extremity Right 11/18/2013    Procedure: IRRIGATION AND DEBRIDEMENT EXTREMITY;  Surgeon: Newt Minion, MD;  Location: [place];  Service: Orthopedics;  Laterality: Right;  Irrigation and Debridement Right Fibula , Place Antibiotic Beads and VAC   Family History  Problem Relation Age of Onset  . Cancer Mother 48    Lung    History   Social History  . Marital Status: Divorced    Spouse Name: N/A    Number of Children: 60  . Years of Education: N/A   Occupational History  . sales/ DJ    Social History Main Topics  . Smoking status: Current Some Day Smoker -- 6 years    Types: Pipe, Cigars  . Smokeless tobacco: Never Used     Comment: 10/06/2013 "use pipe or cigar 1-2 times/month"  . Alcohol Use: Yes     Comment: 10/06/2013 "have 4-5 drinks/yr"  . Drug Use: No  . Sexual Activity: Yes     Comment: 1 every 2 months   Other Topics Concern  . Not on file   Social History Narrative     Objective: BP 123/77 mmHg  Pulse 81  Wt 267 lb (121.11 kg)  Vital signs reviewed. General: Alert and Oriented, No Acute Distress HEENT: Pupils equal, round, reactive to light. Conjunctivae clear.  External ears unremarkable.  Moist mucous membranes. Lungs: Clear and comfortable  work of breathing, speaking in full sentences without accessory muscle use. Cardiac: Regular rate and rhythm.  Neuro: CN II-XII grossly intact, gait normal. Extremities: No peripheral edema.  Strong peripheral pulses.  Mental Status: No depression, anxiety, nor agitation. Logical though process. Skin: Warm and dry.  Assessment & Plan: Jonathan Yu was seen today for f/u phentermine.  Diagnoses and associated orders for this visit:  Obesity - Discontinue: topiramate (TOPAMAX) 50 MG tablet; Take 1 tablet (50 mg total) by mouth 2 (two) times daily. - topiramate (TOPAMAX) 50 MG tablet; Take 1 tablet (50 mg total) by mouth 2 (two) times daily.    Obesity: Stopping  phentermine begin Topamax. Continue dietary and exercise interventions.   Return in about 2 months (around 08/24/2014).

## 2014-07-05 ENCOUNTER — Telehealth: Payer: Self-pay | Admitting: *Deleted

## 2014-07-05 NOTE — Telephone Encounter (Signed)
This is an FYI

## 2014-07-05 NOTE — Telephone Encounter (Signed)
closed

## 2014-07-05 NOTE — Telephone Encounter (Signed)
Pt wanted you to know that he was not able to continue to take the topamax because it made him feel disoriented. He also states he didn't feel like it was really controlling his appetite

## 2014-08-09 ENCOUNTER — Encounter: Payer: Self-pay | Admitting: Infectious Disease

## 2014-08-26 ENCOUNTER — Ambulatory Visit: Payer: Medicare PPO | Admitting: Family Medicine

## 2014-09-23 ENCOUNTER — Ambulatory Visit (INDEPENDENT_AMBULATORY_CARE_PROVIDER_SITE_OTHER): Payer: Medicare PPO | Admitting: Family Medicine

## 2014-09-23 ENCOUNTER — Ambulatory Visit: Payer: Medicare PPO | Admitting: Family Medicine

## 2014-09-23 ENCOUNTER — Encounter: Payer: Self-pay | Admitting: Family Medicine

## 2014-09-23 VITALS — BP 120/76 | HR 69 | Wt 251.0 lb

## 2014-09-23 DIAGNOSIS — I10 Essential (primary) hypertension: Secondary | ICD-10-CM

## 2014-09-23 DIAGNOSIS — E119 Type 2 diabetes mellitus without complications: Secondary | ICD-10-CM

## 2014-09-23 DIAGNOSIS — E669 Obesity, unspecified: Secondary | ICD-10-CM

## 2014-09-23 LAB — POCT GLYCOSYLATED HEMOGLOBIN (HGB A1C): Hemoglobin A1C: 5.4

## 2014-09-23 MED ORDER — PHENTERMINE HCL 30 MG PO CAPS: 30.0000 mg | ORAL_CAPSULE | ORAL | Status: DC

## 2014-09-23 NOTE — Addendum Note (Signed)
Addended by: Terance Hart on: 09/23/2014 02:51 PM   Modules accepted: Orders

## 2014-09-23 NOTE — Progress Notes (Signed)
CC: Jonathan Yu is a 64 y.o. male is here for restart phentermine   Subjective: HPI:  Follow-up obesity: He's been losing 2-3 pounds weak ever since the middle of January since he restarted going to gym and watching what he eats. He is having difficulty with controlling himself with snacking late in the day. This occurs on a daily basis and is moderate to severe in severity. He had leftover phentermine that he is taking occasionally over the last week has noticed that this helps drastically with the above snacking problem. Is working out at least 4 days a week doing cardio without any chest pain  Essential hypertension: Continues to not require any antihypertensive medication. He's noticed that blood pressure remains below 140/95 daily basis and is at its lowest the days that he is working out. Denies chest pain shortness of breath orthopnea nor peripheral edema  Follow-up type 2 diabetes: Denies polyuria polyphagia or polydipsia. No poorly healing wounds. Currently not taking any anti-hyperglycemics. No outside blood sugars to report   Review Of Systems Outlined In HPI  Past Medical History  Diagnosis Date  . Allergy   . Hyperlipidemia     "no meds since losing 106# 2 yr ago" (10/06/2013)  . Walking pneumonia 1968  . OSA on CPAP     "don't wear mask much since losing 106# 2 yr ago" (10/06/2013)  . Type II diabetes mellitus     "no meds since losing 106# 2 yr ago" (10/06/2013)  . Hypertension 2012    "no meds since losing 106# 2 yr ago" (10/06/2013)  . DDD (degenerative disc disease), lumbar     Past Surgical History  Procedure Laterality Date  . Shoulder arthroscopy w/ rotator cuff repair Right 2006; 2009  . Ureteral stent placement  ~ 2010 X 2  . Ankle reconstruction Right 05/2011  . Total ankle arthroplasty Right 08/18/2013    Procedure: TOTAL ANKLE ARTHOPLASTY;  Surgeon: Newt Minion, MD;  Location: [place];  Service: Orthopedics;  Laterality: Right;  Right Total Ankle  Arthroplasty, Revision Fibular Fracture  . Nasal septum surgery  1982  . Ankle fracture surgery Left 1973  . Orif ankle fracture Right 09/08/2013    Procedure: REPAIR SYNDESMOSIS DISRUPTION RIGHT ANKLE;  Surgeon: Newt Minion, MD;  Location: [place];  Service: Orthopedics;  Laterality: Right;  . Tonsillectomy  1959  . Carpal tunnel release Bilateral 2001  . Hardware removal Right 10/07/2013    Procedure: RIGHT ANKLE HARDWARE REMOVAL;  Surgeon: Newt Minion, MD;  Location: [place];  Service: Orthopedics;  Laterality: Right;  . I&d extremity Right 11/18/2013    Procedure: IRRIGATION AND DEBRIDEMENT EXTREMITY;  Surgeon: Newt Minion, MD;  Location: [place];  Service: Orthopedics;  Laterality: Right;  Irrigation and Debridement Right Fibula , Place Antibiotic Beads and VAC   Family History  Problem Relation Age of Onset  . Cancer Mother 21    Lung    History   Social History  . Marital Status: Divorced    Spouse Name: N/A  . Number of Children: 5  . Years of Education: N/A   Occupational History  . sales/ DJ    Social History Main Topics  . Smoking status: Current Some Day Smoker -- 6 years    Types: Pipe, Cigars  . Smokeless tobacco: Never Used     Comment: 10/06/2013 "use pipe or cigar 1-2 times/month"  . Alcohol Use: Yes     Comment: 10/06/2013 "have 4-5 drinks/yr"  .  Drug Use: No  . Sexual Activity: Yes     Comment: 1 every 2 months   Other Topics Concern  . Not on file   Social History Narrative     Objective: BP 120/76 mmHg  Pulse 69  Wt 251 lb (113.853 kg)  General: Alert and Oriented, No Acute Distress HEENT: Pupils equal, round, reactive to light. Conjunctivae clear.   Lungs: Clear to auscultation bilaterally, no wheezing/ronchi/rales.  Comfortable work of breathing. Good air movement. Cardiac: Regular rate and rhythm. Normal S1/S2.  No murmurs, rubs, nor gallops.   Abdomen: Obese Extremities: No peripheral edema.  Strong peripheral pulses.  Mental Status: No  depression, anxiety, nor agitation. Skin: Warm and dry.  Assessment & Plan: Errin was seen today for restart phentermine.  Diagnoses and all orders for this visit:  Obesity  Essential hypertension, benign  Type 2 diabetes mellitus without complication  Other orders -     phentermine 30 MG capsule; Take 1 capsule (30 mg total) by mouth every morning.   Obesity: Congratulated his weight loss, encouraged him to continue working out 4 days a week. Is requesting another trial phentermine, approved if he continues to lose weight. Type 2 diabetes: Clinically controlled will check an A1c today essential hypertension: Controlled no need for antihypertensives ay   Return in about 4 weeks (around 10/21/2014) for CPE.

## 2014-09-30 ENCOUNTER — Ambulatory Visit: Payer: Medicare PPO | Admitting: Family Medicine

## 2014-11-01 ENCOUNTER — Encounter: Payer: Medicare PPO | Admitting: Sports Medicine

## 2014-11-01 ENCOUNTER — Encounter: Payer: Self-pay | Admitting: Family Medicine

## 2014-11-01 ENCOUNTER — Ambulatory Visit (INDEPENDENT_AMBULATORY_CARE_PROVIDER_SITE_OTHER): Payer: Medicare PPO | Admitting: Family Medicine

## 2014-11-01 VITALS — BP 124/78 | HR 70 | Ht 65.5 in | Wt 242.0 lb

## 2014-11-01 DIAGNOSIS — Z0000 Encounter for general adult medical examination without abnormal findings: Secondary | ICD-10-CM | POA: Diagnosis not present

## 2014-11-01 MED ORDER — PHENTERMINE HCL 30 MG PO CAPS: 30.0000 mg | ORAL_CAPSULE | ORAL | Status: DC

## 2014-11-01 MED ORDER — ZOSTER VACCINE LIVE 19400 UNT/0.65ML ~~LOC~~ SOLR: 0.6500 mL | Freq: Once | SUBCUTANEOUS | Status: DC

## 2014-11-01 NOTE — Progress Notes (Signed)
Subjective:    Jonathan Yu is a 64 y.o. male who presents for Medicare Initial preventive examination.   Preventive Screening-Counseling & Management  Tobacco History  Smoking status  . Current Some Day Smoker -- 6 years  . Types: Pipe, Cigars  Smokeless tobacco  . Never Used    Comment: 10/06/2013 "use pipe or cigar 1-2 times/month"    Colonoscopy:~ 2011 no polpys and was given ten year clearance Prostate: Discussed screening risks/beneifts with patient today. PSA of 1.24 08/2011 consistently below 4 at [facility], most recent check in October of 2014  Influenza Vaccine: UTD Pneumovax: 2010 Td/Tdap: 2012 Zoster: given Rx today  Problems Prior to Visit 1. Difficulty losing weight however improving with exercise regimens.  Current Problems (verified) Patient Active Problem List   Diagnosis Date Noted  . Obesity 12/21/2013  . Osteomyelitis 12/21/2013  . Acute osteomyelitis of right fibula 11/18/2013  . Infected hardware in right leg 10/08/2013  . History of total ankle replacement 08/18/2013  . ED (erectile dysfunction) 09/10/2011  . BPH (benign prostatic hyperplasia) 09/10/2011  . Kidney stones 01/28/2011  . Testosterone deficiency 01/24/2011  . OSA on CPAP 01/24/2011  . Right lumbar radiculopathy 01/24/2011  . Essential hypertension, benign 12/20/2010  . Fatigue 12/20/2010  . Dyslipidemia 12/20/2010    Medications Prior to Visit Current Outpatient Prescriptions on File Prior to Visit  Medication Sig Dispense Refill  . ibuprofen (ADVIL,MOTRIN) 200 MG tablet Take 800 mg by mouth every 6 (six) hours as needed for moderate pain.     Marland Kitchen loratadine (CLARITIN) 10 MG tablet Take 10 mg by mouth daily.    . Misc. Devices (FLEX THERAPY) MISC by Does not apply route.    . Multiple Vitamin (MULTIVITAMIN WITH MINERALS) TABS tablet Take 1 tablet by mouth daily.    . Omega-3 Fatty Acids (FISH OIL PO) Take 1 capsule by mouth daily.    Marland Kitchen OVER THE COUNTER MEDICATION Take 1  tablet by mouth 4 (four) times daily - after meals and at bedtime. -pH balance formula Alkolete Yoli     No current facility-administered medications on file prior to visit.    Current Medications (verified) Current Outpatient Prescriptions  Medication Sig Dispense Refill  . ibuprofen (ADVIL,MOTRIN) 200 MG tablet Take 800 mg by mouth every 6 (six) hours as needed for moderate pain.     Marland Kitchen loratadine (CLARITIN) 10 MG tablet Take 10 mg by mouth daily.    . Misc. Devices (FLEX THERAPY) MISC by Does not apply route.    . Multiple Vitamin (MULTIVITAMIN WITH MINERALS) TABS tablet Take 1 tablet by mouth daily.    . Omega-3 Fatty Acids (FISH OIL PO) Take 1 capsule by mouth daily.    Marland Kitchen OVER THE COUNTER MEDICATION Take 1 tablet by mouth 4 (four) times daily - after meals and at bedtime. -pH balance formula Alkolete Yoli    . phentermine 30 MG capsule Take 1 capsule (30 mg total) by mouth every morning. 30 capsule 0  . zoster vaccine live, PF, (ZOSTAVAX) 28413 UNT/0.65ML injection Inject 19,400 Units into the skin once. 1 each 0   No current facility-administered medications for this visit.     Allergies (verified) Sesame oil; Topamax; Percocet; and Vicodin   PAST HISTORY  Family History Family History  Problem Relation Age of Onset  . Cancer Mother 48    Lung    Social History History  Substance Use Topics  . Smoking status: Current Some Day Smoker -- 6 years  Types: Pipe, Cigars  . Smokeless tobacco: Never Used     Comment: 10/06/2013 "use pipe or cigar 1-2 times/month"  . Alcohol Use: Yes     Comment: 10/06/2013 "have 4-5 drinks/yr"    Are there smokers in your home (other than you)?  No  Risk Factors Current exercise habits: Gym/ health club routine includes cardio and light weights.  Dietary issues discussed: DASH   Cardiac risk factors: advanced age (older than 58 for men, 64 for women), dyslipidemia, hypertension and male gender.  Depression Screen (Note: if answer to  either of the following is "Yes", a more complete depression screening is indicated)   Q1: Over the past two weeks, have you felt down, depressed or hopeless? No  Q2: Over the past two weeks, have you felt little interest or pleasure in doing things? No  Have you lost interest or pleasure in daily life? No  Do you often feel hopeless? No  Do you cry easily over simple problems? No  Activities of Daily Living In your present state of health, do you have any difficulty performing the following activities?:  Driving? No Managing money?  No Feeding yourself? No Getting from bed to chair? No Climbing a flight of stairs? No Preparing food and eating?: No Bathing or showering? No Getting dressed: No Getting to the toilet? No Using the toilet:No Moving around from place to place: No In the past year have you fallen or had a near fall?:No   Are you sexually active?  No  Do you have more than one partner?  No  Hearing Difficulties: Yes Do you often ask people to speak up or repeat themselves? Yes Do you experience ringing or noises in your ears? No Do you have difficulty understanding soft or whispered voices? No   Do you feel that you have a problem with memory? No  Do you often misplace items? No  Do you feel safe at home?  No  Cognitive Testing  Alert? Yes  Normal Appearance?Yes  Oriented to person? Yes  Place? Yes   Time? Yes  Recall of three objects?  Yes  Can perform simple calculations? Yes  Displays appropriate judgment?Yes  Can read the correct time from a watch face?Yes   Advanced Directives have been discussed with the patient? Yes   List the Names of Other Physician/Practitioners you currently use: 1.  Dr. Carson Myrtle any recent Medical Services you may have received from other than [facility] providers in the past year (date may be approximate).  Immunization History  Administered Date(s) Administered  . Influenza Split 07/16/2012  . Influenza Whole 04/25/2011   . Influenza,inj,Quad PF,36+ Mos 03/23/2014, 06/10/2014  . Influenza-Unspecified 05/12/2013  . Pneumococcal Conjugate-13 06/16/2009  . Tdap 04/25/2011    Screening Tests Health Maintenance  Topic Date Due  . HIV Screening  09/17/1967  . OPHTHALMOLOGY EXAM  07/24/2012  . ZOSTAVAX  09/16/2012  . FOOT EXAM  07/16/2013  . URINE MICROALBUMIN  07/16/2013  . PNEUMOCOCCAL POLYSACCHARIDE VACCINE (2) 06/12/2014  . INFLUENZA VACCINE  03/13/2015  . HEMOGLOBIN A1C  03/24/2015  . COLONOSCOPY  12/18/2019  . TETANUS/TDAP  04/24/2021    All answers were reviewed with the patient and necessary referrals were made:  Marcial Pacas, DO   11/01/2014   History reviewed: allergies, current medications, past family history, past medical history, past social history, past surgical history and problem list  Review of Systems Review of Systems - General ROS: negative for - chills, fever, night sweats,  weight gain or weight loss Ophthalmic ROS: negative for - decreased vision Psychological ROS: negative for - anxiety or depression ENT ROS: negative for - hearing change, nasal congestion, tinnitus or allergies Hematological and Lymphatic ROS: negative for - bleeding problems, bruising or swollen lymph nodes Breast ROS: negative Respiratory ROS: no cough, shortness of breath, or wheezing Cardiovascular ROS: no chest pain or dyspnea on exertion Gastrointestinal ROS: no abdominal pain, change in bowel habits, or black or bloody stools Genito-Urinary ROS: negative for - genital discharge, genital ulcers, incontinence or abnormal bleeding from genitals Musculoskeletal ROS: negative for - joint pain or muscle pain Neurological ROS: negative for - headaches or memory loss Dermatological ROS: negative for lumps, mole changes, rash and skin lesion changes   Objective:     Vision by Snellen chart: right eye:20/40, left eye:20/50 Blood pressure 124/78, pulse 70, height 5' 5.5" (1.664 m), weight 242 lb (109.77  kg). Body mass index is 39.64 kg/(m^2).  General: No Acute Distress HEENT: Atraumatic, normocephalic, conjunctivae normal without scleral icterus.  No nasal discharge, hearing grossly intact, TMs with good landmarks bilaterally with no middle ear abnormalities, posterior pharynx clear without oral lesions. Neck: Supple, trachea midline, no cervical nor supraclavicular adenopathy. Pulmonary: Clear to auscultation bilaterally without wheezing, rhonchi, nor rales. Cardiac: Regular rate and rhythm.  No murmurs, rubs, nor gallops. No peripheral edema.  2+ peripheral pulses bilaterally. Abdomen: Bowel sounds normal.  No masses.  Non-tender without rebound.  Negative Murphy's sign. GU: Penis without lesions.  Bilateral descended non-tender testicles without palpable abnormal masses. MSK: Grossly intact, no signs of weakness.  Full strength throughout upper and lower extremities.  Full ROM in upper and lower extremities.  No midline spinal tenderness. Neuro: Gait unremarkable, CN II-XII grossly intact.  C5-C6 Reflex 2/4 Bilaterally, L4 Reflex 2/4 Bilaterally.  Cerebellar function intact. Skin: No rashes. Psych: Alert and oriented to person/place/time.  Thought process normal. No anxiety/depression.     Assessment:      hearing loss and vision loss, he is going to consider going to see him scrubbed to have his hearing checked but right now he is not interested in considering hearing aids so no formal referral to cardiology. Will be following up with his ophthalmologist for vision loss.      Plan:     During the course of the visit the patient was educated and counseled about appropriate screening and preventive services including:    Prostate cancer screening  Zostavas  Diet review for nutrition referral? Not indicated  Patient Instructions (the written plan) was given to the patient.  Medicare Attestation I have personally reviewed: The patient's medical and social history Their use of  alcohol, tobacco or illicit drugs Their current medications and supplements The patient's functional ability including ADLs,fall risks, home safety risks, cognitive, and hearing and visual impairment Diet and physical activities Evidence for depression or mood disorders  The patient's weight, height, BMI, and visual acuity have been recorded in the chart.  I have made referrals, counseling, and provided education to the patient based on review of the above and I have provided the patient with a written personalized care plan for preventive services.     Marcial Pacas, DO   11/01/2014

## 2014-11-02 ENCOUNTER — Telehealth: Payer: Self-pay | Admitting: Family Medicine

## 2014-11-02 DIAGNOSIS — E785 Hyperlipidemia, unspecified: Secondary | ICD-10-CM

## 2014-11-02 LAB — CBC
HCT: 46.9 % (ref 39.0–52.0)
Hemoglobin: 15.9 g/dL (ref 13.0–17.0)
MCH: 29.6 pg (ref 26.0–34.0)
MCHC: 33.9 g/dL (ref 30.0–36.0)
MCV: 87.3 fL (ref 78.0–100.0)
MPV: 10.2 fL (ref 8.6–12.4)
Platelets: 241 10*3/uL (ref 150–400)
RBC: 5.37 MIL/uL (ref 4.22–5.81)
RDW: 13.9 % (ref 11.5–15.5)
WBC: 6.3 10*3/uL (ref 4.0–10.5)

## 2014-11-02 LAB — COMPLETE METABOLIC PANEL WITH GFR
ALT: 31 U/L (ref 0–53)
AST: 21 U/L (ref 0–37)
Albumin: 4.2 g/dL (ref 3.5–5.2)
Alkaline Phosphatase: 55 U/L (ref 39–117)
BUN: 19 mg/dL (ref 6–23)
CO2: 29 mEq/L (ref 19–32)
Calcium: 9.6 mg/dL (ref 8.4–10.5)
Chloride: 103 mEq/L (ref 96–112)
Creat: 0.89 mg/dL (ref 0.50–1.35)
GFR, Est African American: >89 mL/min
GFR, Est Non African American: >89 mL/min
Glucose, Bld: 94 mg/dL (ref 70–99)
Potassium: 4.4 mEq/L (ref 3.5–5.3)
Sodium: 138 mEq/L (ref 135–145)
Total Bilirubin: 0.7 mg/dL (ref 0.2–1.2)
Total Protein: 7 g/dL (ref 6.0–8.3)

## 2014-11-02 LAB — LIPID PANEL
Cholesterol: 180 mg/dL (ref 0–200)
HDL: 41 mg/dL (ref >=40)
LDL Cholesterol: 121 mg/dL — ABNORMAL HIGH (ref 0–99)
Total CHOL/HDL Ratio: 4.4 Ratio
Triglycerides: 91 mg/dL (ref <150)
VLDL: 18 mg/dL (ref 0–40)

## 2014-11-02 LAB — PSA: PSA: 2.63 ng/mL (ref <=4.00)

## 2014-11-02 NOTE — Telephone Encounter (Signed)
Jonathan Yu, Will you please let patient know that his kidney function, liver function, PSA, blood sugar and blood cell counts were all normal.  His cholesterol was almost identical to last year putting him at a 10% risk of having a heart attack in the next ten years.  Like last year I'd recommend starting a cholesterol lowering medication called atorvastatin, please let me know if he's interested in taking me up on this offer.

## 2014-11-07 NOTE — Telephone Encounter (Signed)
Pt notified. At this time he does not want to start medication. He has started an exercise program and diet. I advised that we could recheck labs in 3-6 months. He wanted me to rout to provider to make sure this was ok

## 2014-11-08 NOTE — Telephone Encounter (Signed)
Understood 

## 2014-11-29 ENCOUNTER — Encounter: Payer: Self-pay | Admitting: Family Medicine

## 2014-11-29 ENCOUNTER — Ambulatory Visit (INDEPENDENT_AMBULATORY_CARE_PROVIDER_SITE_OTHER): Payer: Medicare PPO | Admitting: Family Medicine

## 2014-11-29 VITALS — BP 138/81 | HR 70 | Wt 241.0 lb

## 2014-11-29 DIAGNOSIS — E669 Obesity, unspecified: Secondary | ICD-10-CM | POA: Diagnosis not present

## 2014-11-29 DIAGNOSIS — R635 Abnormal weight gain: Secondary | ICD-10-CM | POA: Diagnosis not present

## 2014-11-29 MED ORDER — PHENTERMINE HCL 37.5 MG PO CAPS: 37.5000 mg | ORAL_CAPSULE | ORAL | Status: DC

## 2014-11-29 NOTE — Progress Notes (Signed)
CC: Jonathan Yu is a 64 y.o. male is here for follow up on meds   Subjective: HPI:  Follow-up obesity: He tells me that he feels like his current dose of phentermine was not as potent as his dose in the past. It has helped with appetite suppression but only to a mild degree. No formal exercise routine but he has been extremely physically active with hobbies and helping family members move since I saw him last. He feels like he's lost a few pounds of weight. He is trying to reduce the amount of carbohydrates in his diet along with overall calories. No gastrointestinal complaints.   Review Of Systems Outlined In HPI  Past Medical History  Diagnosis Date  . Allergy   . Hyperlipidemia     "no meds since losing 106# 2 yr ago" (10/06/2013)  . Walking pneumonia 1968  . OSA on CPAP     "don't wear mask much since losing 106# 2 yr ago" (10/06/2013)  . Type II diabetes mellitus     "no meds since losing 106# 2 yr ago" (10/06/2013)  . Hypertension 2012    "no meds since losing 106# 2 yr ago" (10/06/2013)  . DDD (degenerative disc disease), lumbar     Past Surgical History  Procedure Laterality Date  . Shoulder arthroscopy w/ rotator cuff repair Right 2006; 2009  . Ureteral stent placement  ~ 2010 X 2  . Ankle reconstruction Right 05/2011  . Total ankle arthroplasty Right 08/18/2013    Procedure: TOTAL ANKLE ARTHOPLASTY;  Surgeon: Newt Minion, MD;  Location: [place];  Service: Orthopedics;  Laterality: Right;  Right Total Ankle Arthroplasty, Revision Fibular Fracture  . Nasal septum surgery  1982  . Ankle fracture surgery Left 1973  . Orif ankle fracture Right 09/08/2013    Procedure: REPAIR SYNDESMOSIS DISRUPTION RIGHT ANKLE;  Surgeon: Newt Minion, MD;  Location: [place];  Service: Orthopedics;  Laterality: Right;  . Tonsillectomy  1959  . Carpal tunnel release Bilateral 2001  . Hardware removal Right 10/07/2013    Procedure: RIGHT ANKLE HARDWARE REMOVAL;  Surgeon: Newt Minion, MD;   Location: [place];  Service: Orthopedics;  Laterality: Right;  . I&d extremity Right 11/18/2013    Procedure: IRRIGATION AND DEBRIDEMENT EXTREMITY;  Surgeon: Newt Minion, MD;  Location: [place];  Service: Orthopedics;  Laterality: Right;  Irrigation and Debridement Right Fibula , Place Antibiotic Beads and VAC   Family History  Problem Relation Age of Onset  . Cancer Mother 61    Lung    History   Social History  . Marital Status: Divorced    Spouse Name: N/A  . Number of Children: 5  . Years of Education: N/A   Occupational History  . sales/ DJ    Social History Main Topics  . Smoking status: Current Some Day Smoker -- 6 years    Types: Pipe, Cigars  . Smokeless tobacco: Never Used     Comment: 10/06/2013 "use pipe or cigar 1-2 times/month"  . Alcohol Use: Yes     Comment: 10/06/2013 "have 4-5 drinks/yr"  . Drug Use: No  . Sexual Activity: Yes     Comment: 1 every 2 months   Other Topics Concern  . Not on file   Social History Narrative     Objective: BP 138/81 mmHg  Pulse 70  Wt 241 lb (109.317 kg)  Vital signs reviewed. General: Alert and Oriented, No Acute Distress HEENT: Pupils equal, round, reactive to light.  Conjunctivae clear.  External ears unremarkable.  Moist mucous membranes. Lungs: Clear and comfortable work of breathing, speaking in full sentences without accessory muscle use. Cardiac: Regular rate and rhythm.  Neuro: CN II-XII grossly intact, gait normal. Extremities: No peripheral edema.  Strong peripheral pulses.  Mental Status: No depression, anxiety, nor agitation. Logical though process. Skin: Warm and dry.  Assessment & Plan: [name] was seen today for follow up on meds.  Diagnoses and all orders for this visit:  Abnormal weight gain  Obesity  Other orders -     phentermine 37.5 MG capsule; Take 1 capsule (37.5 mg total) by mouth every morning.   Obesity: Informed him that I gave him the 30 mg formulation as opposed to 37.5 formulation  to leave room for improvement if he was in a situation where he was having difficult losing weight. Since he has lost weight but not as much as we expected increasing to the 37.5 mg formulation. Discussed physical activity and sodium reduction to help keep blood pressure down.  Return in about 4 weeks (around 12/27/2014) for Nurse visit BP/Weight.

## 2014-12-27 ENCOUNTER — Ambulatory Visit (INDEPENDENT_AMBULATORY_CARE_PROVIDER_SITE_OTHER): Payer: Medicare PPO | Admitting: Family Medicine

## 2014-12-27 ENCOUNTER — Encounter: Payer: Self-pay | Admitting: Family Medicine

## 2014-12-27 VITALS — BP 131/81 | HR 68 | Wt 239.0 lb

## 2014-12-27 DIAGNOSIS — R635 Abnormal weight gain: Secondary | ICD-10-CM

## 2014-12-27 MED ORDER — PHENTERMINE HCL 37.5 MG PO CAPS: 37.5000 mg | ORAL_CAPSULE | ORAL | Status: DC

## 2014-12-27 NOTE — Progress Notes (Signed)
   Subjective:    Patient ID: Jonathan Yu, male    DOB: 1950-11-03, 64 y.o.   MRN: [id number]  HPI  Mareo Portilla is here for blood pressure and weight check. Diet and exercise is going well. Denies trouble sleeping or palpitations.    Review of Systems     Objective:   Physical Exam        Assessment & Plan:  Abnormal weight loss - Patient has lost weight. A refill will be faxed to the pharmacy.

## 2014-12-27 NOTE — Progress Notes (Signed)
Success with weight loss, refills provided

## 2015-01-24 ENCOUNTER — Telehealth: Payer: Self-pay | Admitting: *Deleted

## 2015-01-24 ENCOUNTER — Ambulatory Visit (INDEPENDENT_AMBULATORY_CARE_PROVIDER_SITE_OTHER): Payer: Medicare PPO | Admitting: Family Medicine

## 2015-01-24 VITALS — BP 136/89 | HR 69 | Wt 238.0 lb

## 2015-01-24 DIAGNOSIS — R635 Abnormal weight gain: Secondary | ICD-10-CM

## 2015-01-24 MED ORDER — PHENTERMINE HCL 37.5 MG PO CAPS: 37.5000 mg | ORAL_CAPSULE | ORAL | Status: DC

## 2015-01-24 NOTE — Progress Notes (Signed)
Weight loss success, Rx in Andrea's inbox.

## 2015-01-24 NOTE — Telephone Encounter (Signed)
Pt notified and rx faxed

## 2015-01-24 NOTE — Progress Notes (Signed)
   Subjective:    Patient ID: Jonathan Yu, male    DOB: 07-07-1951, 64 y.o.   MRN: [id number]  HPI Patient is here for blood pressure and weight check. Denies any trouble sleeping, palpitations, or any other medication problems. Pt reports he has not been very careful with his diet this past month because he has been helping two of his children move. He also reports his Orthopedic Dr. Sharol Given put the patient on "nitro patches" for some achilles pain he has been having. Pt used these patches for three days then discontinued them due to headaches. States he contacted his Ortho and advised them he was no longer using the patches.    Review of Systems     Objective:   Physical Exam        Assessment & Plan:  Patient has lost some weight, reports he was 7 pounds lighter last week than he is today. A refill for Phentermine will need to be approved by PCP prior to being sent to patient preferred pharmacy. Patient advised to schedule a four week nurse visit and keep her upcoming appointment with her PCP. Verbalized understanding, no further questions.

## 2015-03-10 ENCOUNTER — Ambulatory Visit (INDEPENDENT_AMBULATORY_CARE_PROVIDER_SITE_OTHER): Payer: Medicare PPO | Admitting: Family Medicine

## 2015-03-10 VITALS — BP 130/88 | HR 83 | Wt 249.0 lb

## 2015-03-10 DIAGNOSIS — R635 Abnormal weight gain: Secondary | ICD-10-CM

## 2015-03-10 MED ORDER — PHENTERMINE HCL 37.5 MG PO CAPS: 37.5000 mg | ORAL_CAPSULE | ORAL | Status: DC

## 2015-03-10 NOTE — Progress Notes (Signed)
Weight loss success, refill ok.

## 2015-03-10 NOTE — Progress Notes (Signed)
Pt.notified

## 2015-03-10 NOTE — Progress Notes (Signed)
   Subjective:    Patient ID: Jonathan Yu, male    DOB: June 07, 1951, 64 y.o.   MRN: [id number]  HPI  Patient is here for blood pressure and weight check. Denies any trouble sleeping, palpitations, or any other medication problems. Pt has not been exercising or dieting lately, and has been off his Phentermine for the past 8 days.   Review of Systems     Objective:   Physical Exam        Assessment & Plan:  Patient has not lost weight. A refill for Phentermine will be sent to Provider for review based on weight gain. Advised I would contact him regarding the status on his refill. Verbalized understanding, no further questions.

## 2015-09-28 DIAGNOSIS — M25571 Pain in right ankle and joints of right foot: Secondary | ICD-10-CM | POA: Diagnosis not present

## 2015-09-28 DIAGNOSIS — M7542 Impingement syndrome of left shoulder: Secondary | ICD-10-CM | POA: Diagnosis not present

## 2015-09-28 DIAGNOSIS — M25511 Pain in right shoulder: Secondary | ICD-10-CM | POA: Diagnosis not present

## 2015-09-29 ENCOUNTER — Ambulatory Visit (INDEPENDENT_AMBULATORY_CARE_PROVIDER_SITE_OTHER): Payer: Medicare PPO | Admitting: Family Medicine

## 2015-09-29 ENCOUNTER — Encounter: Payer: Self-pay | Admitting: Family Medicine

## 2015-09-29 VITALS — BP 137/83 | HR 73 | Wt 243.0 lb

## 2015-09-29 DIAGNOSIS — Z23 Encounter for immunization: Secondary | ICD-10-CM

## 2015-09-29 DIAGNOSIS — I10 Essential (primary) hypertension: Secondary | ICD-10-CM | POA: Diagnosis not present

## 2015-09-29 DIAGNOSIS — Z0000 Encounter for general adult medical examination without abnormal findings: Secondary | ICD-10-CM | POA: Diagnosis not present

## 2015-09-29 DIAGNOSIS — Z125 Encounter for screening for malignant neoplasm of prostate: Secondary | ICD-10-CM | POA: Diagnosis not present

## 2015-09-29 DIAGNOSIS — E785 Hyperlipidemia, unspecified: Secondary | ICD-10-CM

## 2015-09-29 NOTE — Progress Notes (Signed)
Subjective:    Jonathan Yu is a 65 y.o. male who presents for Medicare Annual/Subsequent preventive examination.   Preventive Screening-Counseling & Management  Tobacco History  Smoking status  . Current Some Day Smoker -- 6 years  . Types: Pipe, Cigars  Smokeless tobacco  . Never Used    Comment: 10/06/2013 "use pipe or cigar 1-2 times/month"    Colonoscopy: UTD from 2011 Prostate: Discussed screening risks/beneifts with patient today, needs a PSA   Influenza Vaccine: will receive today Pneumovax: UTD Td/Tdap: UTD from 2012 Zoster: UTD    Problems Prior to Visit 1. None  Current Problems (verified) Patient Active Problem List   Diagnosis Date Noted  . Obesity 12/21/2013  . Osteomyelitis ([place]) 12/21/2013  . Acute osteomyelitis of right fibula ([place]) 11/18/2013  . Infected hardware in right leg ([place]) 10/08/2013  . History of total ankle replacement 08/18/2013  . ED (erectile dysfunction) 09/10/2011  . BPH (benign prostatic hyperplasia) 09/10/2011  . Kidney stones 01/28/2011  . Testosterone deficiency 01/24/2011  . OSA on CPAP 01/24/2011  . Right lumbar radiculopathy 01/24/2011  . Essential hypertension, benign 12/20/2010  . Fatigue 12/20/2010  . Dyslipidemia 12/20/2010    Medications Prior to Visit Current Outpatient Prescriptions on File Prior to Visit  Medication Sig Dispense Refill  . ibuprofen (ADVIL,MOTRIN) 200 MG tablet Take 800 mg by mouth every 6 (six) hours as needed for moderate pain.     Marland Kitchen loratadine (CLARITIN) 10 MG tablet Take 10 mg by mouth daily.    . Misc. Devices (FLEX THERAPY) MISC by Does not apply route.    . Multiple Vitamin (MULTIVITAMIN WITH MINERALS) TABS tablet Take 1 tablet by mouth daily.    . naproxen sodium (ALEVE) 220 MG tablet Take 220 mg by mouth 2 (two) times daily with a meal.    . Omega-3 Fatty Acids (FISH OIL PO) Take 1 capsule by mouth daily.    Marland Kitchen OVER THE COUNTER MEDICATION Take 1 tablet by mouth 4 (four) times daily -  after meals and at bedtime. -pH balance formula Alkolete Yoli    . zoster vaccine live, PF, (ZOSTAVAX) 16109 UNT/0.65ML injection Inject 19,400 Units into the skin once. 1 each 0   No current facility-administered medications on file prior to visit.    Current Medications (verified) Current Outpatient Prescriptions  Medication Sig Dispense Refill  . ibuprofen (ADVIL,MOTRIN) 200 MG tablet Take 800 mg by mouth every 6 (six) hours as needed for moderate pain.     Marland Kitchen loratadine (CLARITIN) 10 MG tablet Take 10 mg by mouth daily.    . Misc. Devices (FLEX THERAPY) MISC by Does not apply route.    . Multiple Vitamin (MULTIVITAMIN WITH MINERALS) TABS tablet Take 1 tablet by mouth daily.    . naproxen sodium (ALEVE) 220 MG tablet Take 220 mg by mouth 2 (two) times daily with a meal.    . Omega-3 Fatty Acids (FISH OIL PO) Take 1 capsule by mouth daily.    Marland Kitchen OVER THE COUNTER MEDICATION Take 1 tablet by mouth 4 (four) times daily - after meals and at bedtime. -pH balance formula Alkolete Yoli    . zoster vaccine live, PF, (ZOSTAVAX) 60454 UNT/0.65ML injection Inject 19,400 Units into the skin once. 1 each 0   No current facility-administered medications for this visit.     Allergies (verified) Sesame oil; Topamax; Percocet; and Vicodin   PAST HISTORY  Family History Family History  Problem Relation Age of Onset  . Cancer Mother 102  Lung    Social History Social History  Substance Use Topics  . Smoking status: Current Some Day Smoker -- 6 years    Types: Pipe, Cigars  . Smokeless tobacco: Never Used     Comment: 10/06/2013 "use pipe or cigar 1-2 times/month"  . Alcohol Use: Yes     Comment: 10/06/2013 "have 4-5 drinks/yr"    Are there smokers in your home (other than you)?  Yes  Risk Factors Current exercise habits: Gym/ health club routine includes cardio and light weights.  Dietary issues discussed: DASH   Cardiac risk factors: advanced age (older than 51 for men, 20 for women)  and dyslipidemia.  Depression Screen (Note: if answer to either of the following is "Yes", a more complete depression screening is indicated)   Q1: Over the past two weeks, have you felt down, depressed or hopeless? No  Q2: Over the past two weeks, have you felt little interest or pleasure in doing things? No  Have you lost interest or pleasure in daily life? No  Do you often feel hopeless? No  Do you cry easily over simple problems? No  Activities of Daily Living In your present state of health, do you have any difficulty performing the following activities?:  Driving? No Managing money?  No Feeding yourself? No Getting from bed to chair? No Climbing a flight of stairs? No Preparing food and eating?: No Bathing or showering? No Getting dressed: No Getting to the toilet? No Using the toilet:No Moving around from place to place: No In the past year have you fallen or had a near fall?:No   Are you sexually active?  Yes  Do you have more than one partner?  No  Hearing Difficulties: No Do you often ask people to speak up or repeat themselves? No Do you experience ringing or noises in your ears? No Do you have difficulty understanding soft or whispered voices? No   Do you feel that you have a problem with memory? No  Do you often misplace items? No  Do you feel safe at home?  Yes Cognitive Testing  Alert? Yes  Normal Appearance?Yes  Oriented to person? Yes  Place? Yes   Time? Yes  Recall of three objects?  Yes  Can perform simple calculations? Yes  Displays appropriate judgment?Yes  Can read the correct time from a watch face?Yes   Advanced Directives have been discussed with the patient? Yes   List the Names of Other Physician/Practitioners you currently use: 1.  [facility] any recent Medical Services you may have received from other than [facility] providers in the past year (date may be approximate).  Immunization History  Administered Date(s) Administered  .  Influenza Split 07/16/2012  . Influenza Whole 04/25/2011  . Influenza,inj,Quad PF,36+ Mos 03/23/2014, 06/10/2014  . Influenza-Unspecified 05/12/2013  . Pneumococcal Conjugate-13 06/16/2009  . Tdap 04/25/2011  . Zoster 10/11/2014    Screening Tests Health Maintenance  Topic Date Due  . HIV Screening  09/17/1967  . OPHTHALMOLOGY EXAM  07/24/2012  . ZOSTAVAX  09/16/2012  . FOOT EXAM  07/16/2013  . URINE MICROALBUMIN  07/16/2013  . PNEUMOCOCCAL POLYSACCHARIDE VACCINE (2) 06/12/2014  . INFLUENZA VACCINE  03/13/2015  . HEMOGLOBIN A1C  03/24/2015  . COLONOSCOPY  12/18/2019  . TETANUS/TDAP  04/24/2021  . Hepatitis C Screening  Completed    All answers were reviewed with the patient and necessary referrals were made:  Marcial Pacas, DO   09/29/2015   History reviewed: allergies, current medications,  past family history, past medical history, past social history, past surgical history and problem list  Review of Systems Review of Systems - General ROS: negative for - chills, fever, night sweats, weight gain or weight loss Ophthalmic ROS: negative for - decreased vision Psychological ROS: negative for - anxiety or depression ENT ROS: negative for - hearing change, nasal congestion, tinnitus or allergies Hematological and Lymphatic ROS: negative for - bleeding problems, bruising or swollen lymph nodes Breast ROS: negative Respiratory ROS: no cough, shortness of breath, or wheezing Cardiovascular ROS: no chest pain or dyspnea on exertion Gastrointestinal ROS: no abdominal pain, change in bowel habits, or black or bloody stools Genito-Urinary ROS: negative for - genital discharge, genital ulcers, incontinence or abnormal bleeding from genitals Musculoskeletal ROS: negative for - joint pain or muscle pain Neurological ROS: negative for - headaches or memory loss Dermatological ROS: negative for lumps, mole changes, rash and skin lesion changes   Objective:     Vision by Snellen chart:  right eye:20/30, left eye:20/50 Blood pressure 137/83, pulse 73, weight 243 lb (110.224 kg). Body mass index is 39.81 kg/(m^2).  General: No Acute Distress HEENT: Atraumatic, normocephalic, conjunctivae normal without scleral icterus.  No nasal discharge, hearing grossly intact, TMs with good landmarks bilaterally with no middle ear abnormalities, posterior pharynx clear without oral lesions. Neck: Supple, trachea midline, no cervical nor supraclavicular adenopathy. Pulmonary: Clear to auscultation bilaterally without wheezing, rhonchi, nor rales. Cardiac: Regular rate and rhythm.  No murmurs, rubs, nor gallops. No peripheral edema.  2+ peripheral pulses bilaterally. Abdomen: Bowel sounds normal.  No masses.  Non-tender without rebound.  Negative Murphy's sign. MSK: Grossly intact, no signs of weakness.  Full strength throughout upper and lower extremities.  Full ROM in upper and lower extremities.  No midline spinal tenderness. Neuro: Gait unremarkable, CN II-XII grossly intact.  C5-C6 Reflex 2/4 Bilaterally, L4 Reflex 2/4 Bilaterally.  Cerebellar function intact. Skin: No rashes. Psych: Alert and oriented to person/place/time.  Thought process normal. No anxiety/depression.     Assessment:     Healthy adult due for labs and flu shot today     Plan:     During the course of the visit the patient was educated and counseled about appropriate screening and preventive services including:    Influenza vaccine  Prostate cancer screening  Diabetes screening  Diet review for nutrition referral? Not Indicated ____   Patient Instructions (the written plan) was given to the patient.  Medicare Attestation I have personally reviewed: The patient's medical and social history Their use of alcohol, tobacco or illicit drugs Their current medications and supplements The patient's functional ability including ADLs,fall risks, home safety risks, cognitive, and hearing and visual  impairment Diet and physical activities Evidence for depression or mood disorders  The patient's weight, height, BMI, and visual acuity have been recorded in the chart.  I have made referrals, counseling, and provided education to the patient based on review of the above and I have provided the patient with a written personalized care plan for preventive services.     Marcial Pacas, DO   09/29/2015

## 2015-09-30 LAB — LIPID PANEL
Cholesterol: 222 mg/dL — ABNORMAL HIGH (ref 125–200)
HDL: 50 mg/dL (ref >=40)
LDL Cholesterol: 158 mg/dL — ABNORMAL HIGH (ref <130)
Total CHOL/HDL Ratio: 4.4 Ratio (ref <=5.0)
Triglycerides: 71 mg/dL (ref <150)
VLDL: 14 mg/dL (ref <30)

## 2015-09-30 LAB — COMPLETE METABOLIC PANEL WITH GFR
ALT: 28 U/L (ref 9–46)
AST: 18 U/L (ref 10–35)
Albumin: 4.3 g/dL (ref 3.6–5.1)
Alkaline Phosphatase: 44 U/L (ref 40–115)
BUN: 22 mg/dL (ref 7–25)
CO2: 22 mmol/L (ref 20–31)
Calcium: 9.6 mg/dL (ref 8.6–10.3)
Chloride: 101 mmol/L (ref 98–110)
Creat: 0.86 mg/dL (ref 0.70–1.25)
GFR, Est African American: >89 mL/min (ref >=60)
GFR, Est Non African American: >89 mL/min (ref >=60)
Glucose, Bld: 112 mg/dL — ABNORMAL HIGH (ref 65–99)
Potassium: 4.2 mmol/L (ref 3.5–5.3)
Sodium: 138 mmol/L (ref 135–146)
Total Bilirubin: 1.3 mg/dL — ABNORMAL HIGH (ref 0.2–1.2)
Total Protein: 7.3 g/dL (ref 6.1–8.1)

## 2015-09-30 LAB — PSA: PSA: 1.6 ng/mL (ref <=4.00)

## 2015-09-30 LAB — CBC
HCT: 46.5 % (ref 39.0–52.0)
Hemoglobin: 16.1 g/dL (ref 13.0–17.0)
MCH: 30.4 pg (ref 26.0–34.0)
MCHC: 34.6 g/dL (ref 30.0–36.0)
MCV: 87.7 fL (ref 78.0–100.0)
MPV: 9.8 fL (ref 8.6–12.4)
Platelets: 249 10*3/uL (ref 150–400)
RBC: 5.3 MIL/uL (ref 4.22–5.81)
RDW: 13.3 % (ref 11.5–15.5)
WBC: 12.9 10*3/uL — ABNORMAL HIGH (ref 4.0–10.5)

## 2015-12-26 IMAGING — CR DG CHEST 2V
2 series · 2 of 2 positions shown · non-contrast
Comparison: None.

CLINICAL DATA: Preop evaluation.

EXAM:
CHEST  2 VIEW

[w chest pa]
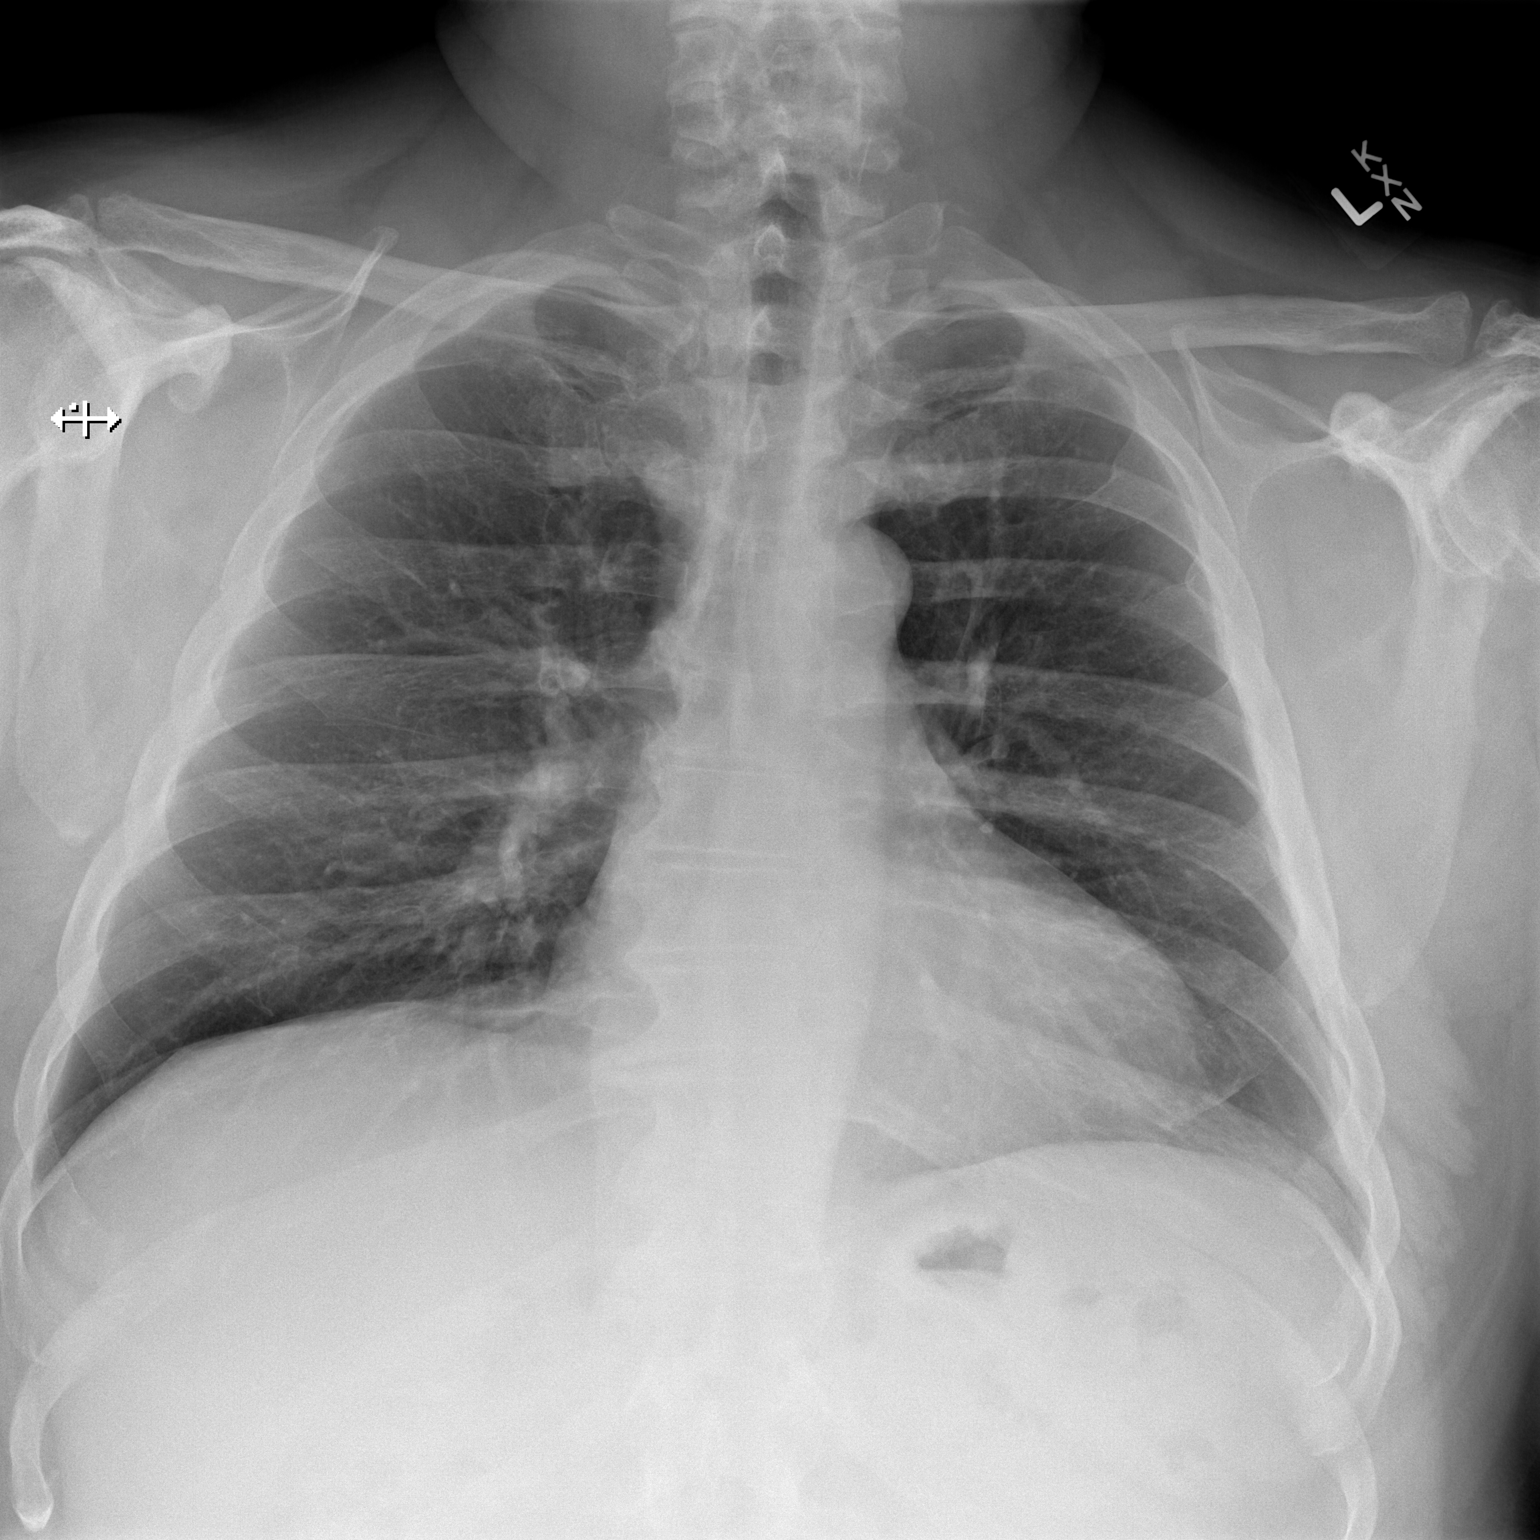

[w chest lat]
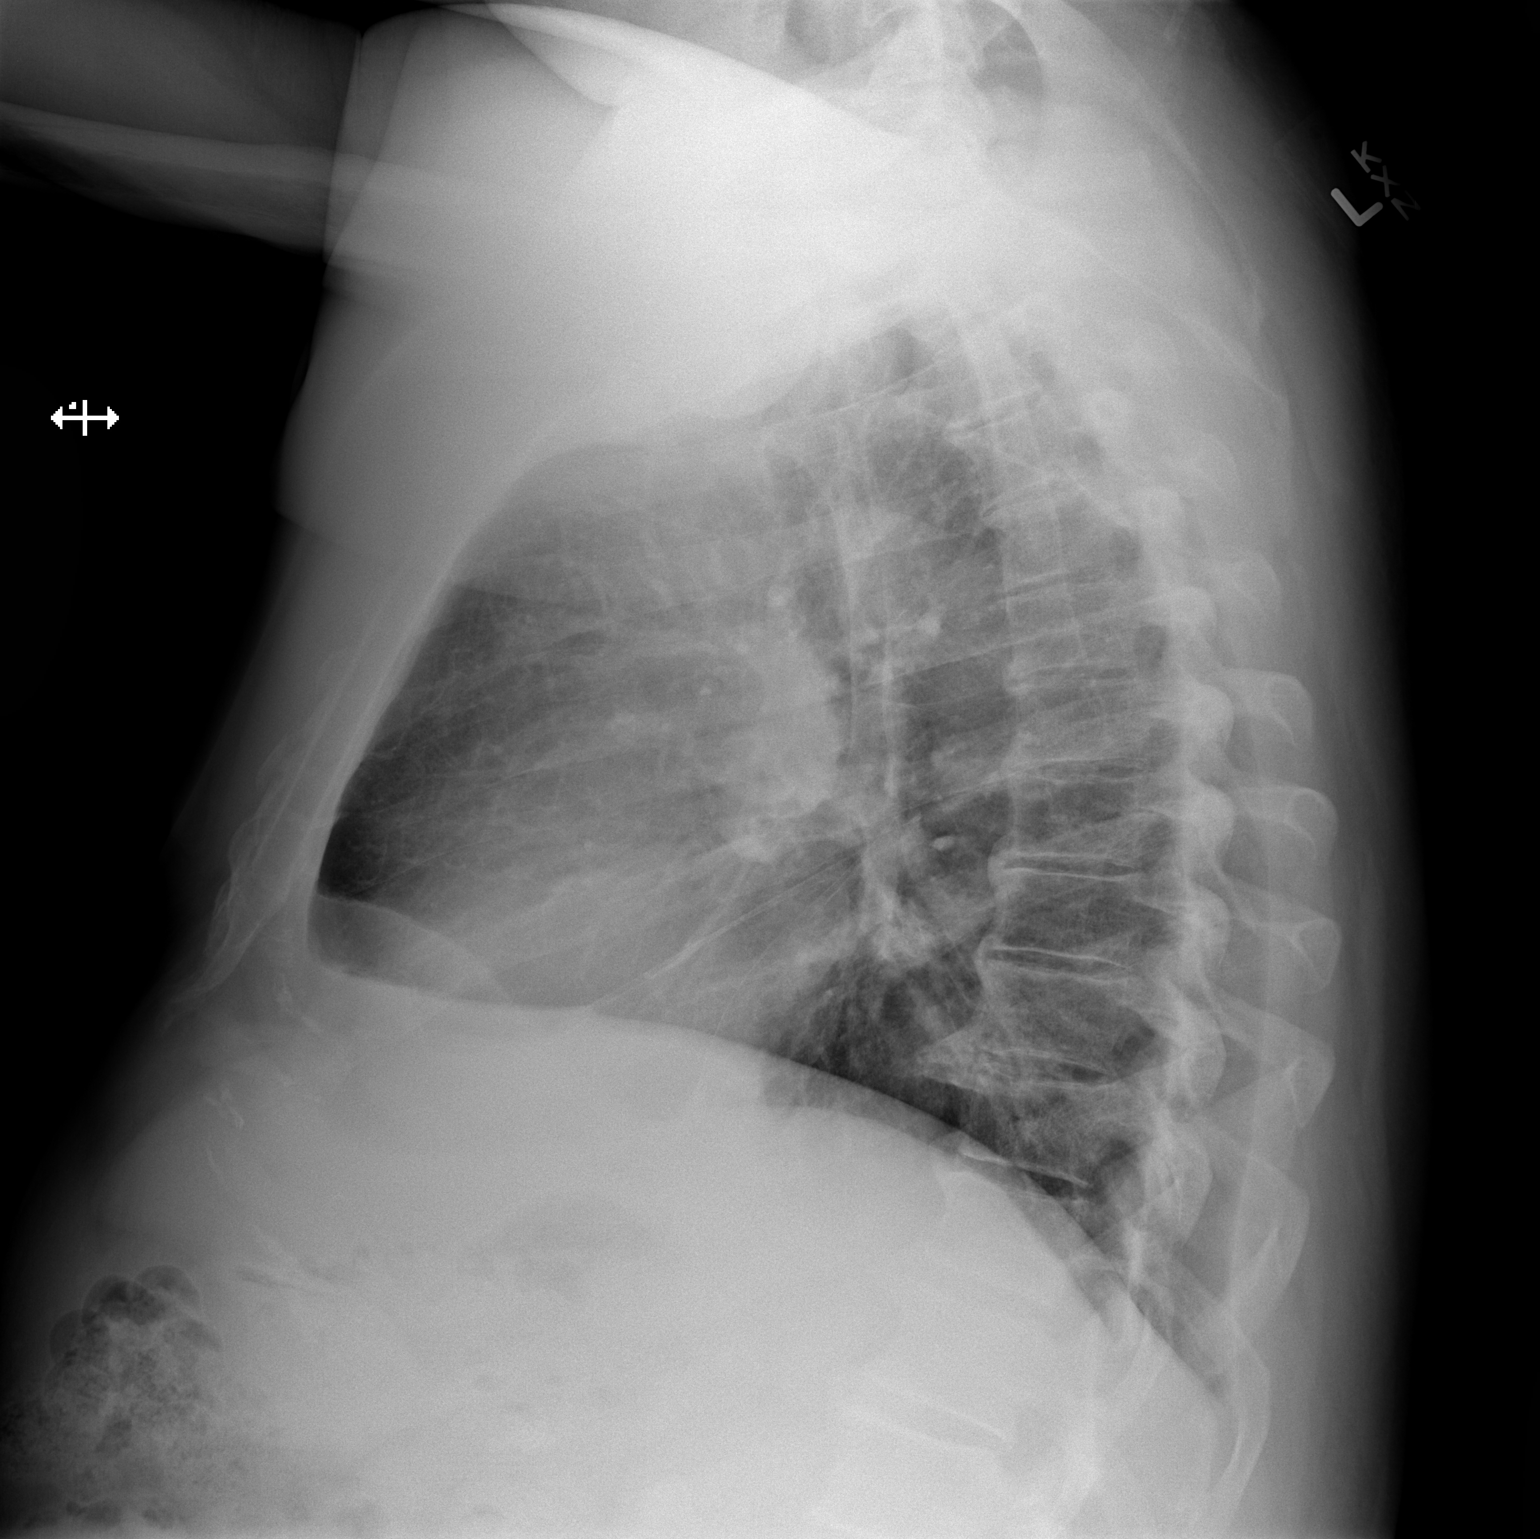

[2 of 2 positions shown; findings below may reference images not displayed]

FINDINGS: Low lung volumes. The heart size and mediastinal contours are within
normal limits. Both lungs are clear. Degenerative changes are
appreciated within the mid and lower thoracic spine.
IMPRESSION: No active cardiopulmonary disease.

## 2016-01-02 ENCOUNTER — Ambulatory Visit (INDEPENDENT_AMBULATORY_CARE_PROVIDER_SITE_OTHER): Payer: Medicare PPO | Admitting: Family Medicine

## 2016-01-02 ENCOUNTER — Encounter: Payer: Self-pay | Admitting: Family Medicine

## 2016-01-02 VITALS — BP 122/82 | HR 76 | Ht 66.0 in | Wt 262.0 lb

## 2016-01-02 DIAGNOSIS — T148 Other injury of unspecified body region: Secondary | ICD-10-CM

## 2016-01-02 DIAGNOSIS — T148XXA Other injury of unspecified body region, initial encounter: Secondary | ICD-10-CM

## 2016-01-02 DIAGNOSIS — K2970 Gastritis, unspecified, without bleeding: Secondary | ICD-10-CM | POA: Diagnosis not present

## 2016-01-02 DIAGNOSIS — E349 Endocrine disorder, unspecified: Secondary | ICD-10-CM

## 2016-01-02 DIAGNOSIS — E291 Testicular hypofunction: Secondary | ICD-10-CM

## 2016-01-02 NOTE — Progress Notes (Signed)
CC: Jonathan Yu is a 65 y.o. male is here for Heartburn   Subjective: HPI:  Epigastric discomfort localized right at the epigastric region which is only been bothering him 1-2 times a week for the last couple months. It goes away within a few seconds by itself and he can resolve it immediately by taking a drink of water. He denies any radiation of the pain. He denies any exertional component to the pain. Only occurs if he is lying down at night or if he just ate a large meal. No interventions as of yet. He denies nausea or new back pain. Denies any new motor or sensory disturbances. Slight improvement over the past week with reduction of caffeine intake.  Follow-up hypogonadism: He wants to know if it's a good idea for him to start taking testosterone again. He's felt fatigued over the past month and just overall feels weak. The only reason he stopped taking testosterone years ago was that it caused his PSA level to go by a few tenths.  He's having some pain in the bottom of his right foot. It's been present for a matter of weeks. It's not getting any better or worse. It all happened after he thinks he got a splinter. He can't reach it or see it. He denies any fevers, chills or swollen lymph nodes     Review Of Systems Outlined In HPI  Past Medical History  Diagnosis Date  . Allergy   . Hyperlipidemia     "no meds since losing 106# 2 yr ago" (10/06/2013)  . Walking pneumonia 1968  . OSA on CPAP     "don't wear mask much since losing 106# 2 yr ago" (10/06/2013)  . Type II diabetes mellitus ([place])     "no meds since losing 106# 2 yr ago" (10/06/2013)  . Hypertension 2012    "no meds since losing 106# 2 yr ago" (10/06/2013)  . DDD (degenerative disc disease), lumbar     Past Surgical History  Procedure Laterality Date  . Shoulder arthroscopy w/ rotator cuff repair Right 2006; 2009  . Ureteral stent placement  ~ 2010 X 2  . Ankle reconstruction Right 05/2011  . Total ankle arthroplasty  Right 08/18/2013    Procedure: TOTAL ANKLE ARTHOPLASTY;  Surgeon: Newt Minion, MD;  Location: [place];  Service: Orthopedics;  Laterality: Right;  Right Total Ankle Arthroplasty, Revision Fibular Fracture  . Nasal septum surgery  1982  . Ankle fracture surgery Left 1973  . Orif ankle fracture Right 09/08/2013    Procedure: REPAIR SYNDESMOSIS DISRUPTION RIGHT ANKLE;  Surgeon: Newt Minion, MD;  Location: [place];  Service: Orthopedics;  Laterality: Right;  . Tonsillectomy  1959  . Carpal tunnel release Bilateral 2001  . Hardware removal Right 10/07/2013    Procedure: RIGHT ANKLE HARDWARE REMOVAL;  Surgeon: Newt Minion, MD;  Location: [place];  Service: Orthopedics;  Laterality: Right;  . I&d extremity Right 11/18/2013    Procedure: IRRIGATION AND DEBRIDEMENT EXTREMITY;  Surgeon: Newt Minion, MD;  Location: [place];  Service: Orthopedics;  Laterality: Right;  Irrigation and Debridement Right Fibula , Place Antibiotic Beads and VAC   Family History  Problem Relation Age of Onset  . Cancer Mother 74    Lung    Social History   Social History  . Marital Status: Divorced    Spouse Name: N/A  . Number of Children: 5  . Years of Education: N/A   Occupational History  . sales/ DJ  Social History Main Topics  . Smoking status: Current Some Day Smoker -- 6 years    Types: Pipe, Cigars  . Smokeless tobacco: Never Used     Comment: 10/06/2013 "use pipe or cigar 1-2 times/month"  . Alcohol Use: Yes     Comment: 10/06/2013 "have 4-5 drinks/yr"  . Drug Use: No  . Sexual Activity: Yes     Comment: 1 every 2 months   Other Topics Concern  . Not on file   Social History Narrative     Objective: BP 122/82 mmHg  Pulse 76  Ht 5\' 6"  (1.676 m)  Wt 262 lb (118.842 kg)  BMI 42.31 kg/m2  Vital signs reviewed. General: Alert and Oriented, No Acute Distress HEENT: Pupils equal, round, reactive to light. Conjunctivae clear.  External ears unremarkable.  Moist mucous membranes. Lungs: Clear and  comfortable work of breathing, speaking in full sentences without accessory muscle use. Cardiac: Regular rate and rhythm.  Neuro: CN II-XII grossly intact, gait normal. Extremities: No peripheral edema.  Strong peripheral pulses.  Mental Status: No depression, anxiety, nor agitation. Logical though process. Skin: Warm and dry. Small black macule on the bottom of the lateral right foot. No signs of infection.  Assessment & Plan: Johnn was seen today for heartburn.  Diagnoses and all orders for this visit:  Testosterone deficiency -     Testosterone  Gastritis  Splinter   Testosterone deficiency: Seems reasonable to restart testosterone is now symptomatic and his PSA with and CBC were normal in February. We'll restart former regimen if testosterone returns deficient again. Gastritis: I recommend he start taking ranitidine on a daily basis however he politely declines Splinter: Start Epsom salt soaks 15 minutes twice a day for the next week and if no better return for removal.  25 minutes spent face-to-face during visit today of which at least 50% was counseling or coordinating care regarding: 1. Testosterone deficiency   2. Gastritis   3. Splinter      Return in about 3 months (around 04/03/2016).

## 2016-01-03 ENCOUNTER — Telehealth: Payer: Self-pay | Admitting: Family Medicine

## 2016-01-03 LAB — TESTOSTERONE: Testosterone: 156 ng/dL — ABNORMAL LOW (ref 250–827)

## 2016-01-03 NOTE — Telephone Encounter (Signed)
Pt notified and does want to start the testosterone injections. He will call back to schedule the injections as a nurse visit

## 2016-01-03 NOTE — Telephone Encounter (Signed)
Will you please let patient know that his testosterone level was deficient as predicted and if he's interested in starting testosterone shots I've placed an order for this to be done as a nurse visit every three weeks.

## 2016-01-04 ENCOUNTER — Ambulatory Visit (INDEPENDENT_AMBULATORY_CARE_PROVIDER_SITE_OTHER): Payer: Medicare PPO | Admitting: Sports Medicine

## 2016-01-04 VITALS — BP 139/82 | HR 78

## 2016-01-04 DIAGNOSIS — E291 Testicular hypofunction: Secondary | ICD-10-CM

## 2016-01-04 MED ORDER — TESTOSTERONE CYPIONATE 200 MG/ML IM SOLN
200.0000 mg | Freq: Once | INTRAMUSCULAR | Status: AC
  Administered 2016-01-04: 200 mg via INTRAMUSCULAR

## 2016-01-04 NOTE — Progress Notes (Signed)
   Subjective:    Patient ID: Jonathan Yu, male    DOB: [birth year], 65 y.o.   MRN: [id number] Testosterone injection given LUOQ without complications.  Pt advised to return in 3 weeks for next injection.  Beatris Ship, CMA HPI    Review of Systems     Objective:   Physical Exam        Assessment & Plan:

## 2016-01-25 ENCOUNTER — Encounter: Payer: Self-pay | Admitting: Family Medicine

## 2016-01-25 ENCOUNTER — Ambulatory Visit (INDEPENDENT_AMBULATORY_CARE_PROVIDER_SITE_OTHER): Payer: Medicare PPO | Admitting: Family Medicine

## 2016-01-25 VITALS — BP 149/80 | HR 65

## 2016-01-25 DIAGNOSIS — M795 Residual foreign body in soft tissue: Secondary | ICD-10-CM

## 2016-01-25 DIAGNOSIS — E291 Testicular hypofunction: Secondary | ICD-10-CM

## 2016-01-25 MED ORDER — TESTOSTERONE CYPIONATE 200 MG/ML IM SOLN
200.0000 mg | Freq: Once | INTRAMUSCULAR | Status: AC
  Administered 2016-01-25: 200 mg via INTRAMUSCULAR

## 2016-01-25 NOTE — Progress Notes (Signed)
Jonathan Yu is a 65 y.o. male who presents to [facility] today for foreign body right foot. Patient stepped on a piece of glass with his right foot approximately a month ago. It healed over but continues to be bothersome at times. He notes mild pain. No fevers or chills nausea vomiting or diarrhea. No skin redness or tenderness. He feels well otherwise.   Past Medical History  Diagnosis Date  . Allergy   . Hyperlipidemia     "no meds since losing 106# 2 yr ago" (10/06/2013)  . Walking pneumonia 1968  . OSA on CPAP     "don't wear mask much since losing 106# 2 yr ago" (10/06/2013)  . Type II diabetes mellitus ([place])     "no meds since losing 106# 2 yr ago" (10/06/2013)  . Hypertension 2012    "no meds since losing 106# 2 yr ago" (10/06/2013)  . DDD (degenerative disc disease), lumbar    Past Surgical History  Procedure Laterality Date  . Shoulder arthroscopy w/ rotator cuff repair Right 2006; 2009  . Ureteral stent placement  ~ 2010 X 2  . Ankle reconstruction Right 05/2011  . Total ankle arthroplasty Right 08/18/2013    Procedure: TOTAL ANKLE ARTHOPLASTY;  Surgeon: Newt Minion, MD;  Location: [facility];  Service: Orthopedics;  Laterality: Right;  Right Total Ankle Arthroplasty, Revision Fibular Fracture  . Nasal septum surgery  1982  . Ankle fracture surgery Left 1973  . Orif ankle fracture Right 09/08/2013    Procedure: REPAIR SYNDESMOSIS DISRUPTION RIGHT ANKLE;  Surgeon: Newt Minion, MD;  Location: [facility];  Service: Orthopedics;  Laterality: Right;  . Tonsillectomy  1959  . Carpal tunnel release Bilateral 2001  . Hardware removal Right 10/07/2013    Procedure: RIGHT ANKLE HARDWARE REMOVAL;  Surgeon: Newt Minion, MD;  Location: [facility];  Service: Orthopedics;  Laterality: Right;  . I&d extremity Right 11/18/2013    Procedure: IRRIGATION AND DEBRIDEMENT EXTREMITY;   Surgeon: Newt Minion, MD;  Location: [facility];  Service: Orthopedics;  Laterality: Right;  Irrigation and Debridement Right Fibula , Place Antibiotic Beads and VAC   Social History  Substance Use Topics  . Smoking status: Current Some Day Smoker -- 6 years    Types: Pipe, Cigars  . Smokeless tobacco: Never Used     Comment: 10/06/2013 "use pipe or cigar 1-2 times/month"  . Alcohol Use: Yes     Comment: 10/06/2013 "have 4-5 drinks/yr"   family history includes Cancer (age of onset: 19) in his mother.  ROS as above:  Medications: Current Outpatient Prescriptions  Medication Sig Dispense Refill  . Misc. Devices (FLEX THERAPY) MISC by Does not apply route.    . Multiple Vitamin (MULTIVITAMIN WITH MINERALS) TABS tablet Take 1 tablet by mouth daily.    . naproxen sodium (ALEVE) 220 MG tablet Take 220 mg by mouth 2 (two) times daily with a meal.    . Omega-3 Fatty Acids (FISH OIL PO) Take 1 capsule by mouth daily.    Marland Kitchen OVER THE COUNTER MEDICATION Take 1 tablet by mouth 4 (four) times daily - after meals and at bedtime. -pH balance formula Alkolete Yoli    . testosterone cypionate (DEPOTESTOSTERONE CYPIONATE) 200 MG/ML injection Inject 1 mL (200 mg total) into the muscle every 21 ( twenty-one) days. 10 mL 0  . zoster vaccine live, PF, (ZOSTAVAX) 60454 UNT/0.65ML injection Inject 19,400 Units into the skin  once. (Patient not taking: Reported on 01/25/2016) 1 each 0   No current facility-administered medications for this visit.   Allergies  Allergen Reactions  . Sesame Oil Hives and Shortness Of Breath  . Topamax [Topiramate]     Cognitive decline  . Percocet [Oxycodone-Acetaminophen] Rash  . Vicodin [Hydrocodone-Acetaminophen] Rash     Exam:  BP 149/80 mmHg  Pulse 65 Gen: Well NAD Right plantar foot lateral aspect of mid foot with scabbed area mildly tender to touch with no erythema or exudate.  Removal of foreign body: Consent obtained and timeout performed. Skin cleaned with  alcohol and cold spray applied and 2 mL of lidocaine without epinephrine injected achieving good skin anesthesia. Skin again cleaned with alcohol and a sharp incision was made at the area of scab and keratin.  A scraping sound was heard and the wound was debrided with a small forceps. A scraping sound was not hurt again and this wound was further explored. No obvious fleck of glass was seen however I'm highly suspicious that I removed a tiny fleck of glass that was causing symptoms. A Band-Aid was applied  No results found for this or any previous visit (from the past 24 hour(s)). No results found.    Assessment and Plan: 65 y.o. male with foreign body right foot. Removed today hopefully. Plan for watchful waiting. Follow-up with PCP.  Discussed warning signs or symptoms. Please see discharge instructions. Patient expresses understanding.

## 2016-01-25 NOTE — Progress Notes (Signed)
Patient came into office today for testosterone injection. Denies chest pain, shortness of breath, headaches and problems associated with taking this medication. Patient states he has had no abnornal mood swings. Patient tolerated injection in [place] well without complications. Patient advised to schedule his next injection for 2 weeks from today.  Patient also reports he stepped on a piece of glass about a month ago and is concerned it is still in his right foot. Added Pt on with Dr. Georgina Snell for evaluation.

## 2016-01-25 NOTE — Patient Instructions (Signed)
Thank you for coming in today. Return as needed.   Incision and Drainage Incision and drainage is a procedure in which a sac-like structure (cystic structure) is opened and drained. The area to be drained usually contains material such as pus, fluid, or blood.  LET YOUR CAREGIVER KNOW ABOUT:   Allergies to medicine.  Medicines taken, including vitamins, herbs, eyedrops, over-the-counter medicines, and creams.  Use of steroids (by mouth or creams).  Previous problems with anesthetics or numbing medicines.  History of bleeding problems or blood clots.  Previous surgery.  Other health problems, including diabetes and kidney problems.  Possibility of pregnancy, if this applies. RISKS AND COMPLICATIONS  Pain.  Bleeding.  Scarring.  Infection. BEFORE THE PROCEDURE  You may need to have an ultrasound or other imaging tests to see how large or deep your cystic structure is. Blood tests may also be used to determine if you have an infection or how severe the infection is. You may need to have a tetanus shot. PROCEDURE  The affected area is cleaned with a cleaning fluid. The cyst area will then be numbed with a medicine (local anesthetic). A small incision will be made in the cystic structure. A syringe or catheter may be used to drain the contents of the cystic structure, or the contents may be squeezed out. The area will then be flushed with a cleansing solution. After cleansing the area, it is often gently packed with a gauze or another wound dressing. Once it is packed, it will be covered with gauze and tape or some other type of wound dressing. AFTER THE PROCEDURE   Often, you will be allowed to go home right after the procedure.  You may be given antibiotic medicine to prevent or heal an infection.  If the area was packed with gauze or some other wound dressing, you will likely need to come back in 1 to 2 days to get it removed.  The area should heal in about 14 days.   This  information is not intended to replace advice given to you by your health care provider. Make sure you discuss any questions you have with your health care provider.   Document Released: 01/22/2001 Document Revised: 01/28/2012 Document Reviewed: 09/23/2011 Elsevier Interactive Patient Education [employer].

## 2016-02-15 ENCOUNTER — Ambulatory Visit (INDEPENDENT_AMBULATORY_CARE_PROVIDER_SITE_OTHER): Payer: Medicare PPO | Admitting: Family Medicine

## 2016-02-15 VITALS — BP 143/85 | HR 73 | Wt 279.0 lb

## 2016-02-15 DIAGNOSIS — I10 Essential (primary) hypertension: Secondary | ICD-10-CM

## 2016-02-15 DIAGNOSIS — E291 Testicular hypofunction: Secondary | ICD-10-CM

## 2016-02-15 MED ORDER — TESTOSTERONE CYPIONATE 200 MG/ML IM SOLN
200.0000 mg | Freq: Once | INTRAMUSCULAR | Status: AC
  Administered 2016-02-15: 200 mg via INTRAMUSCULAR

## 2016-02-15 NOTE — Progress Notes (Signed)
   Subjective:    Patient ID: Jonathan Yu, male    DOB: [birth year], 65 y.o.   MRN: [id number]  HPI Patient came into office today for testosterone injection. Denies chest pain, shortness of breath, headaches and problems associated with taking this medication. Patient states he has had no abnornal mood swings..   Review of Systems     Objective:   Physical Exam        Assessment & Plan:  Patient tolerated injection in [place] well without complications. Patient advised to schedule his next injection for 3 weeks from today.   Also noted blood pressure has been elevated the last 2 times he has been here. He is technically due for a six-month follow-up for blood pressure etc. next month. Please encourage him to keep that.  Agree with above. Labs UTD>  Beatrice Lecher, MD

## 2016-03-07 ENCOUNTER — Ambulatory Visit: Payer: Medicare PPO

## 2016-03-07 ENCOUNTER — Encounter: Payer: Self-pay | Admitting: Family Medicine

## 2016-03-07 ENCOUNTER — Ambulatory Visit (INDEPENDENT_AMBULATORY_CARE_PROVIDER_SITE_OTHER): Payer: Medicare PPO | Admitting: Family Medicine

## 2016-03-07 VITALS — BP 161/91 | HR 73 | Wt 278.0 lb

## 2016-03-07 DIAGNOSIS — N529 Male erectile dysfunction, unspecified: Secondary | ICD-10-CM | POA: Diagnosis not present

## 2016-03-07 DIAGNOSIS — I10 Essential (primary) hypertension: Secondary | ICD-10-CM | POA: Diagnosis not present

## 2016-03-07 DIAGNOSIS — E291 Testicular hypofunction: Secondary | ICD-10-CM

## 2016-03-07 DIAGNOSIS — M62838 Other muscle spasm: Secondary | ICD-10-CM

## 2016-03-07 MED ORDER — SILDENAFIL CITRATE 100 MG PO TABS: 25.0000 mg | ORAL_TABLET | Freq: Every day | ORAL | 11 refills | Status: DC | PRN

## 2016-03-07 MED ORDER — TESTOSTERONE CYPIONATE 200 MG/ML IM SOLN
200.0000 mg | Freq: Once | INTRAMUSCULAR | Status: AC
  Administered 2016-03-07: 200 mg via INTRAMUSCULAR

## 2016-03-07 MED ORDER — CYCLOBENZAPRINE HCL 10 MG PO TABS: 10.0000 mg | ORAL_TABLET | Freq: Three times a day (TID) | ORAL | 0 refills | Status: DC | PRN

## 2016-03-07 NOTE — Addendum Note (Signed)
Addended by: Delrae Alfred on: 03/07/2016 12:01 PM   Modules accepted: Orders

## 2016-03-07 NOTE — Progress Notes (Signed)
CC: Jonathan Yu is a 65 y.o. male is here for Spasms and Erectile Dysfunction   Subjective: HPI:  He occasionally gets back spasms around the scapula if he's been lifting heavy objects earlier in the day. He's been managing this with leftover Flexeril that's about 63 years old. He tells me that it seems to work but he is wondering if the medication has expired. He denies any pain with breathing, cough, wheezing or shortness of breath. He denies any exertional chest pain.  He would like to get a prescription for some help with erectile issues. 1 -2times a month he has difficulty maintaining his erection but no difficulty initiating erections. He has no genitourinary complaints.  Review Of Systems Outlined In HPI  Past Medical History:  Diagnosis Date  . Allergy   . DDD (degenerative disc disease), lumbar   . Hyperlipidemia    "no meds since losing 106# 2 yr ago" (10/06/2013)  . Hypertension 2012   "no meds since losing 106# 2 yr ago" (10/06/2013)  . OSA on CPAP    "don't wear mask much since losing 106# 2 yr ago" (10/06/2013)  . Type II diabetes mellitus ([place])    "no meds since losing 106# 2 yr ago" (10/06/2013)  . Walking pneumonia 1968    Past Surgical History:  Procedure Laterality Date  . ANKLE FRACTURE SURGERY Left 1973  . ANKLE RECONSTRUCTION Right 05/2011  . CARPAL TUNNEL RELEASE Bilateral 2001  . HARDWARE REMOVAL Right 10/07/2013   Procedure: RIGHT ANKLE HARDWARE REMOVAL;  Surgeon: Newt Minion, MD;  Location: [facility];  Service: Orthopedics;  Laterality: Right;  . I&D EXTREMITY Right 11/18/2013   Procedure: IRRIGATION AND DEBRIDEMENT EXTREMITY;  Surgeon: Newt Minion, MD;  Location: [facility];  Service: Orthopedics;  Laterality: Right;  Irrigation and Debridement Right Fibula , Place Antibiotic Beads and VAC  . NASAL SEPTUM SURGERY  1982  . ORIF ANKLE FRACTURE Right 09/08/2013   Procedure: REPAIR SYNDESMOSIS DISRUPTION RIGHT ANKLE;  Surgeon: Newt Minion, MD;  Location: [facility];   Service: Orthopedics;  Laterality: Right;  . SHOULDER ARTHROSCOPY W/ ROTATOR CUFF REPAIR Right 2006; 2009  . TONSILLECTOMY  1959  . TOTAL ANKLE ARTHROPLASTY Right 08/18/2013   Procedure: TOTAL ANKLE ARTHOPLASTY;  Surgeon: Newt Minion, MD;  Location: [facility];  Service: Orthopedics;  Laterality: Right;  Right Total Ankle Arthroplasty, Revision Fibular Fracture  . URETERAL STENT PLACEMENT  ~ 2010 X 2   Family History  Problem Relation Age of Onset  . Cancer Mother 14    Lung    Social History   Social History  . Marital status: Divorced    Spouse name: N/A  . Number of children: 5  . Years of education: N/A   Occupational History  . sales/ DJ    Social History Main Topics  . Smoking status: Current Some Day Smoker    Years: 6.00    Types: Pipe, Cigars  . Smokeless tobacco: Never Used     Comment: 10/06/2013 "use pipe or cigar 1-2 times/month"  . Alcohol use Yes     Comment: 10/06/2013 "have 4-5 drinks/yr"  . Drug use: No  . Sexual activity: Yes     Comment: 1 every 2 months   Other Topics Concern  . Not on file   Social History Narrative  . No narrative on file     Objective: BP (!) 161/91   Pulse 73   Wt 278 lb (126.1 kg)   BMI 44.87  kg/m   Vital signs reviewed. General: Alert and Oriented, No Acute Distress HEENT: Pupils equal, round, reactive to light. Conjunctivae clear.  External ears unremarkable.  Moist mucous membranes. Lungs: Clear and comfortable work of breathing, speaking in full sentences without accessory muscle use. Cardiac: Regular rate and rhythm.  Neuro: CN II-XII grossly intact, gait normal. Extremities: No peripheral edema.  Strong peripheral pulses.  Mental Status: No depression, anxiety, nor agitation. Logical though process. Skin: Warm and dry.  Assessment & Plan: Jonathan Yu was seen today for spasms and erectile dysfunction.  Diagnoses and all orders for this visit:  Essential hypertension, benign  Erectile dysfunction, unspecified  erectile dysfunction type  Muscle spasm  Other orders -     cyclobenzaprine (FLEXERIL) 10 MG tablet; Take 1 tablet (10 mg total) by mouth 3 (three) times daily as needed for muscle spasms. -     sildenafil (VIAGRA) 100 MG tablet; Take 0.5-1 tablets (50-100 mg total) by mouth daily as needed for erectile dysfunction.   Essential hypertension: He is not interested in starting medication at this time. Erectile dysfunction: Start Viagra Muscle spasm: Throw out old prescription of cyclobenzaprine and begin using new prescription on as-needed basis.  Discussed with this patient that I will be resigning from my position here with [facility] in September in order to stay with my family who will be moving to [facility]. I let him know about the providers that are still accepting patients and I feel that this individual will be under great care if he/she stays here with [facility].  Return for Testosterone Shot.

## 2016-03-27 DIAGNOSIS — M7542 Impingement syndrome of left shoulder: Secondary | ICD-10-CM | POA: Diagnosis not present

## 2016-03-27 DIAGNOSIS — I872 Venous insufficiency (chronic) (peripheral): Secondary | ICD-10-CM | POA: Diagnosis not present

## 2016-03-28 ENCOUNTER — Ambulatory Visit (INDEPENDENT_AMBULATORY_CARE_PROVIDER_SITE_OTHER): Payer: Medicare PPO | Admitting: Family Medicine

## 2016-03-28 ENCOUNTER — Encounter: Payer: Self-pay | Admitting: Family Medicine

## 2016-03-28 VITALS — BP 143/86 | HR 72 | Wt 272.0 lb

## 2016-03-28 DIAGNOSIS — E291 Testicular hypofunction: Secondary | ICD-10-CM | POA: Diagnosis not present

## 2016-03-28 DIAGNOSIS — Z23 Encounter for immunization: Secondary | ICD-10-CM

## 2016-03-28 DIAGNOSIS — N529 Male erectile dysfunction, unspecified: Secondary | ICD-10-CM

## 2016-03-28 MED ORDER — TESTOSTERONE CYPIONATE 200 MG/ML IM SOLN
200.0000 mg | Freq: Once | INTRAMUSCULAR | Status: AC
  Administered 2016-03-28: 200 mg via INTRAMUSCULAR

## 2016-03-28 MED ORDER — SILDENAFIL CITRATE 100 MG PO TABS: 25.0000 mg | ORAL_TABLET | Freq: Every day | ORAL | 11 refills | Status: DC | PRN

## 2016-03-28 MED ORDER — CYCLOBENZAPRINE HCL 10 MG PO TABS: 10.0000 mg | ORAL_TABLET | Freq: Three times a day (TID) | ORAL | 1 refills | Status: DC | PRN

## 2016-03-28 NOTE — Progress Notes (Signed)
CC: Jonathan Yu is a 65 y.o. male is here for No chief complaint on file.   Subjective: HPI:  Follow-up erectile dysfunction: For some reason his insurance will only fill 6 tabs of Viagra every month and a perception for 8 tabs was not able to be used for only 6 tabs.  Since taking cycling to previous pain has gone away completely. He wants to have a larger supply at home since I'm leaving soon.  Review Of Systems Outlined In HPI  Past Medical History:  Diagnosis Date  . Allergy   . DDD (degenerative disc disease), lumbar   . Hyperlipidemia    "no meds since losing 106# 2 yr ago" (10/06/2013)  . Hypertension 2012   "no meds since losing 106# 2 yr ago" (10/06/2013)  . OSA on CPAP    "don't wear mask much since losing 106# 2 yr ago" (10/06/2013)  . Type II diabetes mellitus ([place])    "no meds since losing 106# 2 yr ago" (10/06/2013)  . Walking pneumonia 1968    Past Surgical History:  Procedure Laterality Date  . ANKLE FRACTURE SURGERY Left 1973  . ANKLE RECONSTRUCTION Right 05/2011  . CARPAL TUNNEL RELEASE Bilateral 2001  . HARDWARE REMOVAL Right 10/07/2013   Procedure: RIGHT ANKLE HARDWARE REMOVAL;  Surgeon: Newt Minion, MD;  Location: [facility];  Service: Orthopedics;  Laterality: Right;  . I&D EXTREMITY Right 11/18/2013   Procedure: IRRIGATION AND DEBRIDEMENT EXTREMITY;  Surgeon: Newt Minion, MD;  Location: [facility];  Service: Orthopedics;  Laterality: Right;  Irrigation and Debridement Right Fibula , Place Antibiotic Beads and VAC  . NASAL SEPTUM SURGERY  1982  . ORIF ANKLE FRACTURE Right 09/08/2013   Procedure: REPAIR SYNDESMOSIS DISRUPTION RIGHT ANKLE;  Surgeon: Newt Minion, MD;  Location: [facility];  Service: Orthopedics;  Laterality: Right;  . SHOULDER ARTHROSCOPY W/ ROTATOR CUFF REPAIR Right 2006; 2009  . TONSILLECTOMY  1959  . TOTAL ANKLE ARTHROPLASTY Right 08/18/2013   Procedure: TOTAL ANKLE ARTHOPLASTY;  Surgeon: Newt Minion, MD;  Location: [facility];  Service: Orthopedics;   Laterality: Right;  Right Total Ankle Arthroplasty, Revision Fibular Fracture  . URETERAL STENT PLACEMENT  ~ 2010 X 2   Family History  Problem Relation Age of Onset  . Cancer Mother 31    Lung    Social History   Social History  . Marital status: Divorced    Spouse name: N/A  . Number of children: 5  . Years of education: N/A   Occupational History  . sales/ DJ    Social History Main Topics  . Smoking status: Current Some Day Smoker    Years: 6.00    Types: Pipe, Cigars  . Smokeless tobacco: Never Used     Comment: 10/06/2013 "use pipe or cigar 1-2 times/month"  . Alcohol use Yes     Comment: 10/06/2013 "have 4-5 drinks/yr"  . Drug use: No  . Sexual activity: Yes     Comment: 1 every 2 months   Other Topics Concern  . Not on file   Social History Narrative  . No narrative on file     Objective: BP (!) 143/86   Pulse 72   Wt 272 lb (123.4 kg)   BMI 43.90 kg/m   Vital signs reviewed. General: Alert and Oriented, No Acute Distress HEENT: Pupils equal, round, reactive to light. Conjunctivae clear.  External ears unremarkable.  Moist mucous membranes. Lungs: Clear and comfortable work of breathing, speaking in full sentences  without accessory muscle use. Cardiac: Regular rate and rhythm.  Neuro: CN II-XII grossly intact, gait normal. Extremities: No peripheral edema.  Strong peripheral pulses.  Mental Status: No depression, anxiety, nor agitation. Logical though process. Skin: Warm and dry.  Assessment & Plan: Diagnoses and all orders for this visit:  Erectile dysfunction, unspecified erectile dysfunction type  Needs flu shot -     Flu Vaccine QUAD 36+ mos IM  Hypogonadism in male -     testosterone cypionate (DEPOTESTOSTERONE CYPIONATE) injection 200 mg; Inject 1 mL (200 mg total) into the muscle once.  Other orders -     sildenafil (VIAGRA) 100 MG tablet; Take 0.5-1 tablets (50-100 mg total) by mouth daily as needed for erectile dysfunction. -      cyclobenzaprine (FLEXERIL) 10 MG tablet; Take 1 tablet (10 mg total) by mouth 3 (three) times daily as needed for muscle spasms.  Erectile dysfunction: Per his request I change the prescription of Viagra so is more affordable He is due for his testosterone shot today, I recommended he get his level checked again in about a month. Refills for cyclobenzaprine per his request   Return in about 3 months (around 06/28/2016).

## 2016-04-03 ENCOUNTER — Telehealth: Payer: Self-pay

## 2016-04-03 MED ORDER — SILDENAFIL CITRATE 20 MG PO TABS: ORAL_TABLET | ORAL | 2 refills | Status: DC

## 2016-04-03 NOTE — Telephone Encounter (Signed)
Request complete

## 2016-04-18 ENCOUNTER — Ambulatory Visit (INDEPENDENT_AMBULATORY_CARE_PROVIDER_SITE_OTHER): Payer: Medicare PPO | Admitting: Osteopathic Medicine

## 2016-04-18 VITALS — BP 142/78 | HR 77 | Temp 98.0°F | Resp 16 | Wt 279.0 lb

## 2016-04-18 DIAGNOSIS — E291 Testicular hypofunction: Secondary | ICD-10-CM

## 2016-04-18 MED ORDER — TESTOSTERONE CYPIONATE 200 MG/ML IM SOLN
200.0000 mg | INTRAMUSCULAR | Status: DC
  Administered 2016-04-18: 200 mg via INTRAMUSCULAR

## 2016-04-18 NOTE — Progress Notes (Signed)
Patient is here for a testosterone injection. Denies chest pain, shortness fo breath, headaches and problems with medicaton or mood changes. States started new weight loss program today.  Patient tolerated the injection well without complications. Patient advised to schedule next appointment in 21 days and to make it early to accommodate testosterone level lab per Dr.Hommel's note.  PAK

## 2016-04-18 NOTE — Progress Notes (Signed)
Reviewed nurse note, no concerns other than mild elevated BP. Due for followup 06/2016, has apt w/ Dr Georgina Snell scheduled.  BP (!) 142/78 (BP Location: Left Arm, Patient Position: Sitting, Cuff Size: Large)   Pulse 77   Temp 98 F (36.7 C) (Oral)   Resp 16   Wt 279 lb (126.6 kg)   SpO2 96%   BMI 45.03 kg/m

## 2016-05-08 ENCOUNTER — Ambulatory Visit (INDEPENDENT_AMBULATORY_CARE_PROVIDER_SITE_OTHER): Payer: Medicare PPO | Admitting: Family Medicine

## 2016-05-08 VITALS — BP 144/68 | HR 74 | Wt 280.0 lb

## 2016-05-08 DIAGNOSIS — E291 Testicular hypofunction: Secondary | ICD-10-CM | POA: Diagnosis not present

## 2016-05-08 DIAGNOSIS — R7989 Other specified abnormal findings of blood chemistry: Secondary | ICD-10-CM

## 2016-05-08 MED ORDER — TESTOSTERONE CYPIONATE 200 MG/ML IM SOLN
200.0000 mg | Freq: Once | INTRAMUSCULAR | Status: AC
  Administered 2016-05-08: 200 mg via INTRAMUSCULAR

## 2016-05-08 NOTE — Progress Notes (Signed)
Patient came into office today for testosterone injection. Denies chest pain, shortness of breath, headaches and problems associated with taking this medication. Patient states he has had no abnornal mood swings. Patient was due for repeat testosterone levels today, he had this blood work completed prior to testosterone injection. Patient tolerated injection in [place] well without complications. Patient advised to schedule his next injection for 3 weeks from today.

## 2016-05-09 ENCOUNTER — Ambulatory Visit: Payer: Medicare PPO

## 2016-05-09 LAB — TESTOSTERONE TOTAL,FREE,BIO, MALES
Albumin: 3.9 g/dL (ref 3.6–5.1)
Sex Hormone Binding: 25 nmol/L (ref 22–77)
Testosterone, Bioavailable: 75.5 ng/dL — ABNORMAL LOW (ref 110.0–575.0)
Testosterone, Free: 42 pg/mL — ABNORMAL LOW (ref 46.0–224.0)
Testosterone: 259 ng/dL (ref 250–827)

## 2016-05-29 ENCOUNTER — Ambulatory Visit (INDEPENDENT_AMBULATORY_CARE_PROVIDER_SITE_OTHER): Payer: Medicare PPO | Admitting: Family Medicine

## 2016-05-29 ENCOUNTER — Ambulatory Visit: Payer: Medicare PPO

## 2016-05-29 ENCOUNTER — Encounter: Payer: Self-pay | Admitting: Family Medicine

## 2016-05-29 VITALS — BP 145/76 | HR 67 | Wt 264.0 lb

## 2016-05-29 DIAGNOSIS — E785 Hyperlipidemia, unspecified: Secondary | ICD-10-CM

## 2016-05-29 DIAGNOSIS — E349 Endocrine disorder, unspecified: Secondary | ICD-10-CM | POA: Diagnosis not present

## 2016-05-29 DIAGNOSIS — I10 Essential (primary) hypertension: Secondary | ICD-10-CM | POA: Diagnosis not present

## 2016-05-29 MED ORDER — TESTOSTERONE CYPIONATE 200 MG/ML IM SOLN
200.0000 mg | Freq: Once | INTRAMUSCULAR | Status: AC
  Administered 2016-05-29: 200 mg via INTRAMUSCULAR

## 2016-05-29 NOTE — Patient Instructions (Signed)
Thank you for coming in today. Get fasting morning labs between testosterone shots.  Bring you blood pressure log and device to the next shot.

## 2016-05-29 NOTE — Progress Notes (Signed)
Jonathan Yu is a 65 y.o. male who presents to [facility] today for follow up HTN and Hypo testosterone.   Patient has been receiving testosterone shots every 3 weeks for the last several months to years. He notes with the shot she feels much better with more vigor. However his testosterone levels have been consistently a bit low. The levels are being checked just prior to the administration of the shot every 3 weeks.  Family when he is here for his testosterone shots his blood pressure has been elevated. He denies any chest pain palpitations or shortness of breath. He does note that in the past when he lost weight his blood pressure went away. He recently gained some weight recently and has been working to lose it again.  Lastly he notes a history of elevated cholesterol. He does not take any medications for cholesterol.   Past Medical History:  Diagnosis Date  . Allergy   . DDD (degenerative disc disease), lumbar   . Hyperlipidemia    "no meds since losing 106# 2 yr ago" (10/06/2013)  . Hypertension 2012   "no meds since losing 106# 2 yr ago" (10/06/2013)  . OSA on CPAP    "don't wear mask much since losing 106# 2 yr ago" (10/06/2013)  . Type II diabetes mellitus ([place])    "no meds since losing 106# 2 yr ago" (10/06/2013)  . Walking pneumonia 1968   Past Surgical History:  Procedure Laterality Date  . ANKLE FRACTURE SURGERY Left 1973  . ANKLE RECONSTRUCTION Right 05/2011  . CARPAL TUNNEL RELEASE Bilateral 2001  . HARDWARE REMOVAL Right 10/07/2013   Procedure: RIGHT ANKLE HARDWARE REMOVAL;  Surgeon: Newt Minion, MD;  Location: [facility];  Service: Orthopedics;  Laterality: Right;  . I&D EXTREMITY Right 11/18/2013   Procedure: IRRIGATION AND DEBRIDEMENT EXTREMITY;  Surgeon: Newt Minion, MD;  Location: [facility];  Service: Orthopedics;  Laterality: Right;  Irrigation and  Debridement Right Fibula , Place Antibiotic Beads and VAC  . NASAL SEPTUM SURGERY  1982  . ORIF ANKLE FRACTURE Right 09/08/2013   Procedure: REPAIR SYNDESMOSIS DISRUPTION RIGHT ANKLE;  Surgeon: Newt Minion, MD;  Location: [facility];  Service: Orthopedics;  Laterality: Right;  . SHOULDER ARTHROSCOPY W/ ROTATOR CUFF REPAIR Right 2006; 2009  . TONSILLECTOMY  1959  . TOTAL ANKLE ARTHROPLASTY Right 08/18/2013   Procedure: TOTAL ANKLE ARTHOPLASTY;  Surgeon: Newt Minion, MD;  Location: [facility];  Service: Orthopedics;  Laterality: Right;  Right Total Ankle Arthroplasty, Revision Fibular Fracture  . URETERAL STENT PLACEMENT  ~ 2010 X 2   Social History  Substance Use Topics  . Smoking status: Current Some Day Smoker    Years: 6.00    Types: Pipe, Cigars  . Smokeless tobacco: Never Used     Comment: 10/06/2013 "use pipe or cigar 1-2 times/month"  . Alcohol use Yes     Comment: 10/06/2013 "have 4-5 drinks/yr"   family history includes Cancer (age of onset: 105) in his mother.  ROS as above:  Medications: Current Outpatient Prescriptions  Medication Sig Dispense Refill  . cyclobenzaprine (FLEXERIL) 10 MG tablet Take 1 tablet (10 mg total) by mouth 3 (three) times daily as needed for muscle spasms. 90 tablet 1  . ibuprofen (ADVIL,MOTRIN) 200 MG tablet Take 200 mg by mouth every 6 (six) hours as needed.    . Misc. Devices (FLEX THERAPY) MISC by Does not apply route.    Marland Kitchen  Multiple Vitamin (MULTIVITAMIN WITH MINERALS) TABS tablet Take 1 tablet by mouth daily.    . Omega-3 Fatty Acids (FISH OIL PO) Take 1 capsule by mouth daily.    Marland Kitchen OVER THE COUNTER MEDICATION Take 1 tablet by mouth 4 (four) times daily - after meals and at bedtime. -pH balance formula Alkolete Yoli    . sildenafil (REVATIO) 20 MG tablet 1-4 tabs by mouth in a 24 hour period as needed for intercourse 30 tablet 2  . testosterone cypionate (DEPOTESTOSTERONE CYPIONATE) 200 MG/ML injection Inject 1 mL (200 mg total) into the muscle every 21 (  twenty-one) days. 10 mL 0  . naproxen sodium (ALEVE) 220 MG tablet Take 220 mg by mouth 2 (two) times daily with a meal.    . sildenafil (VIAGRA) 100 MG tablet Take 0.5-1 tablets (50-100 mg total) by mouth daily as needed for erectile dysfunction. (Patient not taking: Reported on 05/29/2016) 6 tablet 11   No current facility-administered medications for this visit.    Allergies  Allergen Reactions  . Sesame Oil Hives and Shortness Of Breath  . Topamax [Topiramate]     Cognitive decline  . Percocet [Oxycodone-Acetaminophen] Rash  . Vicodin [Hydrocodone-Acetaminophen] Rash    Health Maintenance Health Maintenance  Topic Date Due  . HIV Screening  09/17/1967  . OPHTHALMOLOGY EXAM  07/24/2012  . FOOT EXAM  07/16/2013  . URINE MICROALBUMIN  07/16/2013  . PNEUMOCOCCAL POLYSACCHARIDE VACCINE (2) 06/12/2014  . HEMOGLOBIN A1C  03/24/2015  . COLONOSCOPY  12/18/2019  . TETANUS/TDAP  04/24/2021  . INFLUENZA VACCINE  Completed  . ZOSTAVAX  Completed  . Hepatitis C Screening  Completed     Exam:  BP (!) 145/76   Pulse 67   Wt 264 lb (119.7 kg)   BMI 42.61 kg/m  Gen: Well NAD Obese HEENT: EOMI,  MMM Lungs: Normal work of breathing. CTABL Heart: RRR no MRG Abd: NABS, Soft. Nondistended, Nontender Exts: Brisk capillary refill, warm and well perfused.   Lab Results  Component Value Date   TESTOSTERONE 259 05/08/2016     No results found for this or any previous visit (from the past 72 hour(s)). No results found.    Assessment and Plan: 65 y.o. male with  Low testosterone: I don't think the testosterone values we've been giving her very accurate as they are right at the very end of the cycle of injections. Plan to recheck testosterone in the morning about midway between the testosterone cycles. Additionally we'll recheck fasting lipids 123456 metabolic panel etc. labs listed below.  Hypertension: I suspect patient simply has hypertension. We'll plan for home blood pressure log  and recheck in about 3 weeks. If still elevated I think it's a good idea to be on blood pressure medications.  Hyperlipidemia: Recheck lipids.   Orders Placed This Encounter  Procedures  . CBC  . COMPLETE METABOLIC PANEL WITH GFR  . Hemoglobin A1c  . Lipid panel  . VITAMIN D 25 Hydroxy (Vit-D Deficiency, Fractures)  . Testosterone  . PSA    Discussed warning signs or symptoms. Please see discharge instructions. Patient expresses understanding.

## 2016-05-30 ENCOUNTER — Ambulatory Visit: Payer: Medicare PPO

## 2016-06-11 DIAGNOSIS — E349 Endocrine disorder, unspecified: Secondary | ICD-10-CM | POA: Diagnosis not present

## 2016-06-11 DIAGNOSIS — Z1321 Encounter for screening for nutritional disorder: Secondary | ICD-10-CM | POA: Diagnosis not present

## 2016-06-11 DIAGNOSIS — Z131 Encounter for screening for diabetes mellitus: Secondary | ICD-10-CM | POA: Diagnosis not present

## 2016-06-11 DIAGNOSIS — I10 Essential (primary) hypertension: Secondary | ICD-10-CM | POA: Diagnosis not present

## 2016-06-11 DIAGNOSIS — Z125 Encounter for screening for malignant neoplasm of prostate: Secondary | ICD-10-CM | POA: Diagnosis not present

## 2016-06-11 DIAGNOSIS — E785 Hyperlipidemia, unspecified: Secondary | ICD-10-CM | POA: Diagnosis not present

## 2016-06-11 LAB — COMPLETE METABOLIC PANEL WITH GFR
ALT: 30 U/L (ref 9–46)
AST: 24 U/L (ref 10–35)
Albumin: 3.9 g/dL (ref 3.6–5.1)
Alkaline Phosphatase: 37 U/L — ABNORMAL LOW (ref 40–115)
BUN: 15 mg/dL (ref 7–25)
CO2: 26 mmol/L (ref 20–31)
Calcium: 9.2 mg/dL (ref 8.6–10.3)
Chloride: 102 mmol/L (ref 98–110)
Creat: 0.94 mg/dL (ref 0.70–1.25)
GFR, Est African American: >89 mL/min (ref >=60)
GFR, Est Non African American: 86 mL/min (ref >=60)
Glucose, Bld: 110 mg/dL — ABNORMAL HIGH (ref 65–99)
Potassium: 4.4 mmol/L (ref 3.5–5.3)
Sodium: 137 mmol/L (ref 135–146)
Total Bilirubin: 1.8 mg/dL — ABNORMAL HIGH (ref 0.2–1.2)
Total Protein: 6.6 g/dL (ref 6.1–8.1)

## 2016-06-11 LAB — CBC
HCT: 50.5 % — ABNORMAL HIGH (ref 38.5–50.0)
Hemoglobin: 17.2 g/dL — ABNORMAL HIGH (ref 13.2–17.1)
MCH: 30 pg (ref 27.0–33.0)
MCHC: 34.1 g/dL (ref 32.0–36.0)
MCV: 88.1 fL (ref 80.0–100.0)
MPV: 9.9 fL (ref 7.5–12.5)
Platelets: 232 10*3/uL (ref 140–400)
RBC: 5.73 MIL/uL (ref 4.20–5.80)
RDW: 14.3 % (ref 11.0–15.0)
WBC: 6.5 10*3/uL (ref 3.8–10.8)

## 2016-06-11 LAB — LIPID PANEL
Cholesterol: 186 mg/dL (ref 125–200)
HDL: 30 mg/dL — ABNORMAL LOW (ref >=40)
LDL Cholesterol: 115 mg/dL (ref <130)
Total CHOL/HDL Ratio: 6.2 Ratio — ABNORMAL HIGH (ref <=5.0)
Triglycerides: 206 mg/dL — ABNORMAL HIGH (ref <150)
VLDL: 41 mg/dL — ABNORMAL HIGH (ref <30)

## 2016-06-11 LAB — HEMOGLOBIN A1C
Hgb A1c MFr Bld: 5.4 % (ref <5.7)
Mean Plasma Glucose: 108 mg/dL

## 2016-06-12 LAB — PSA: PSA: 1.8 ng/mL (ref <=4.0)

## 2016-06-12 LAB — TESTOSTERONE: Testosterone: 409 ng/dL (ref 250–827)

## 2016-06-12 LAB — VITAMIN D 25 HYDROXY (VIT D DEFICIENCY, FRACTURES): Vit D, 25-Hydroxy: 19 ng/mL — ABNORMAL LOW (ref 30–100)

## 2016-06-12 MED ORDER — ATORVASTATIN CALCIUM 40 MG PO TABS: 40.0000 mg | ORAL_TABLET | Freq: Every day | ORAL | 0 refills | Status: DC

## 2016-06-12 NOTE — Addendum Note (Signed)
Addended by: Gregor Hams on: 06/12/2016 07:56 AM   Modules accepted: Orders

## 2016-06-19 ENCOUNTER — Ambulatory Visit: Payer: Medicare PPO | Admitting: Family Medicine

## 2016-06-19 ENCOUNTER — Encounter: Payer: Self-pay | Admitting: Family Medicine

## 2016-06-19 ENCOUNTER — Ambulatory Visit (INDEPENDENT_AMBULATORY_CARE_PROVIDER_SITE_OTHER): Payer: Medicare PPO | Admitting: Family Medicine

## 2016-06-19 VITALS — BP 159/87 | HR 86 | Wt 272.0 lb

## 2016-06-19 DIAGNOSIS — E349 Endocrine disorder, unspecified: Secondary | ICD-10-CM | POA: Diagnosis not present

## 2016-06-19 DIAGNOSIS — E291 Testicular hypofunction: Secondary | ICD-10-CM | POA: Diagnosis not present

## 2016-06-19 DIAGNOSIS — I10 Essential (primary) hypertension: Secondary | ICD-10-CM

## 2016-06-19 DIAGNOSIS — E785 Hyperlipidemia, unspecified: Secondary | ICD-10-CM | POA: Diagnosis not present

## 2016-06-19 MED ORDER — TESTOSTERONE CYPIONATE 200 MG/ML IM SOLN
200.0000 mg | Freq: Once | INTRAMUSCULAR | Status: AC
  Administered 2016-06-19: 200 mg via INTRAMUSCULAR

## 2016-06-19 NOTE — Progress Notes (Signed)
Jonathan Yu is a 65 y.o. male who presents to [facility] today for hypertension hyperlipidemia and low testosterone.  Hypertension: Patient has a history of hypertension that previously was controlled with weight loss. He was found to have a mildly elevated blood pressure recently. He obtain home screening lipid pressure log over the last few weeks which shows pressures typically in the 130s. He denies any chest pain palpitations or shortness of breath. He is currently actively losing weight.  Hyperlipidemia: Patient has a history of hyperlipidemia with an estimated Framingham risk score of around 20%. He is very reluctant to consider statins. No chest pains palpitations or shortness of breath.  Low testosterone: Currently receiving supplementation. No change in libido.    Past Medical History:  Diagnosis Date  . Allergy   . DDD (degenerative disc disease), lumbar   . Hyperlipidemia    "no meds since losing 106# 2 yr ago" (10/06/2013)  . Hypertension 2012   "no meds since losing 106# 2 yr ago" (10/06/2013)  . OSA on CPAP    "don't wear mask much since losing 106# 2 yr ago" (10/06/2013)  . Type II diabetes mellitus ([place] Yu)    "no meds since losing 106# 2 yr ago" (10/06/2013)  . Walking pneumonia 1968   Past Surgical History:  Procedure Laterality Date  . ANKLE FRACTURE SURGERY Left 1973  . ANKLE RECONSTRUCTION Right 05/2011  . CARPAL TUNNEL RELEASE Bilateral 2001  . HARDWARE REMOVAL Right 10/07/2013   Procedure: RIGHT ANKLE HARDWARE REMOVAL;  Surgeon: Newt Minion, MD;  Location: [facility];  Service: Orthopedics;  Laterality: Right;  . I&D EXTREMITY Right 11/18/2013   Procedure: IRRIGATION AND DEBRIDEMENT EXTREMITY;  Surgeon: Newt Minion, MD;  Location: [facility];  Service: Orthopedics;  Laterality: Right;  Irrigation and Debridement Right Fibula , Place Antibiotic Beads  and VAC  . NASAL SEPTUM SURGERY  1982  . ORIF ANKLE FRACTURE Right 09/08/2013   Procedure: REPAIR SYNDESMOSIS DISRUPTION RIGHT ANKLE;  Surgeon: Newt Minion, MD;  Location: [facility];  Service: Orthopedics;  Laterality: Right;  . SHOULDER ARTHROSCOPY W/ ROTATOR CUFF REPAIR Right 2006; 2009  . TONSILLECTOMY  1959  . TOTAL ANKLE ARTHROPLASTY Right 08/18/2013   Procedure: TOTAL ANKLE ARTHOPLASTY;  Surgeon: Newt Minion, MD;  Location: [facility];  Service: Orthopedics;  Laterality: Right;  Right Total Ankle Arthroplasty, Revision Fibular Fracture  . URETERAL STENT PLACEMENT  ~ 2010 X 2   Social History  Substance Use Topics  . Smoking status: Current Some Day Smoker    Years: 6.00    Types: Pipe, Cigars  . Smokeless tobacco: Never Used     Comment: 10/06/2013 "use pipe or cigar 1-2 times/month"  . Alcohol use Yes     Comment: 10/06/2013 "have 4-5 drinks/yr"   family history includes Cancer (age of onset: 77) in his mother.  ROS as above:  Medications: Current Outpatient Prescriptions  Medication Sig Dispense Refill  . cyclobenzaprine (FLEXERIL) 10 MG tablet Take 1 tablet (10 mg total) by mouth 3 (three) times daily as needed for muscle spasms. 90 tablet 1  . ibuprofen (ADVIL,MOTRIN) 200 MG tablet Take 200 mg by mouth every 6 (six) hours as needed.    . Misc. Devices (FLEX THERAPY) MISC by Does not apply route.    . Multiple Vitamin (MULTIVITAMIN WITH MINERALS) TABS tablet Take 1 tablet by mouth daily.    . naproxen sodium (ALEVE) 220 MG tablet  Take 220 mg by mouth 2 (two) times daily with a meal.    . Omega-3 Fatty Acids (FISH OIL PO) Take 1 capsule by mouth daily.    Marland Kitchen OVER THE COUNTER MEDICATION Take 1 tablet by mouth 4 (four) times daily - after meals and at bedtime. -pH balance formula Alkolete Yoli    . sildenafil (REVATIO) 20 MG tablet 1-4 tabs by mouth in a 24 hour period as needed for intercourse 30 tablet 2  . sildenafil (VIAGRA) 100 MG tablet Take 0.5-1 tablets (50-100 mg total) by  mouth daily as needed for erectile dysfunction. (Patient not taking: Reported on 05/29/2016) 6 tablet 11  . testosterone cypionate (DEPOTESTOSTERONE CYPIONATE) 200 MG/ML injection Inject 1 mL (200 mg total) into the muscle every 21 ( twenty-one) days. 10 mL 0   No current facility-administered medications for this visit.    Allergies  Allergen Reactions  . Sesame Oil Hives and Shortness Of Breath  . Topamax [Topiramate]     Cognitive decline  . Percocet [Oxycodone-Acetaminophen] Rash  . Vicodin [Hydrocodone-Acetaminophen] Rash    Health Maintenance Health Maintenance  Topic Date Due  . HIV Screening  09/17/1967  . COLONOSCOPY  12/18/2019  . TETANUS/TDAP  04/24/2021  . INFLUENZA VACCINE  Completed  . ZOSTAVAX  Completed  . Hepatitis C Screening  Completed     Exam:  BP (!) 159/87   Pulse 86   Wt 272 lb (123.4 kg)   BMI 43.90 kg/m  Gen: Well NAD OBESE  HEENT: EOMI,  MMM Lungs: Normal work of breathing. CTABL Heart: RRR no MRG Abd: NABS, Soft. Nondistended, Nontender Exts: Brisk capillary refill, warm and well perfused.   Lab Results  Component Value Date   TESTOSTERONE 409 06/11/2016     No results found for this or any previous visit (from the past 72 hour(s)). No results found.    Assessment and Plan: 65 y.o. male with  Hypertension: Blood pressure reasonably controlled at home. Plan to work on weight loss and lifestyle change as patient would like to avoid medications if possible. Recheck in a few months.  Hyperlipidemia: Elevated Framingham risk score. Patient would like to work on lifestyle changes. Recheck in the near future.  Low testosterone: Recent labs were in the normal range. Continue current regimen.   No orders of the defined types were placed in this encounter.   Discussed warning signs or symptoms. Please see discharge instructions. Patient expresses understanding.

## 2016-06-19 NOTE — Patient Instructions (Signed)
Thank you for coming in today. We will work on lifestyle.  Continue weight loss. Recheck in 5-6 months.  Continue the blood pressure log.  Return sooner if needed.

## 2016-06-20 ENCOUNTER — Ambulatory Visit: Payer: Medicare PPO | Admitting: Family Medicine

## 2016-07-10 ENCOUNTER — Ambulatory Visit (INDEPENDENT_AMBULATORY_CARE_PROVIDER_SITE_OTHER): Payer: Medicare PPO | Admitting: Family Medicine

## 2016-07-10 VITALS — BP 146/87 | HR 69

## 2016-07-10 DIAGNOSIS — E291 Testicular hypofunction: Secondary | ICD-10-CM | POA: Diagnosis not present

## 2016-07-10 MED ORDER — TESTOSTERONE CYPIONATE 200 MG/ML IM SOLN
200.0000 mg | Freq: Once | INTRAMUSCULAR | Status: AC
  Administered 2016-07-10: 200 mg via INTRAMUSCULAR

## 2016-07-10 NOTE — Progress Notes (Signed)
Pt is here for a testosterone injection. Pt tolerated injection well in the [place]OQ without any complications. Pt advised to make next appointment in 21 days.

## 2016-07-31 ENCOUNTER — Ambulatory Visit (INDEPENDENT_AMBULATORY_CARE_PROVIDER_SITE_OTHER): Payer: Medicare PPO | Admitting: Family Medicine

## 2016-07-31 VITALS — BP 130/84 | HR 66 | Wt 276.0 lb

## 2016-07-31 DIAGNOSIS — E291 Testicular hypofunction: Secondary | ICD-10-CM | POA: Diagnosis not present

## 2016-07-31 MED ORDER — TESTOSTERONE CYPIONATE 200 MG/ML IM SOLN
200.0000 mg | Freq: Once | INTRAMUSCULAR | Status: AC
  Administered 2016-07-31: 200 mg via INTRAMUSCULAR

## 2016-07-31 NOTE — Progress Notes (Signed)
Patient came into office today for testosterone injection. Denies chest pain, shortness of breath, headaches and problems associated with taking this medication. Patient states he has had no abnornal mood swings. Patient tolerated injection in LUOQ well without complications. Patient advised to schedule his next injection for 3 weeks from today. 

## 2016-08-21 ENCOUNTER — Ambulatory Visit (INDEPENDENT_AMBULATORY_CARE_PROVIDER_SITE_OTHER): Payer: Medicare PPO | Admitting: Sports Medicine

## 2016-08-21 VITALS — BP 152/83 | HR 66 | Wt 279.0 lb

## 2016-08-21 DIAGNOSIS — E291 Testicular hypofunction: Secondary | ICD-10-CM

## 2016-08-21 MED ORDER — TESTOSTERONE CYPIONATE 200 MG/ML IM SOLN
200.0000 mg | Freq: Once | INTRAMUSCULAR | Status: AC
  Administered 2016-08-21: 200 mg via INTRAMUSCULAR

## 2016-08-21 NOTE — Progress Notes (Signed)
Patient came into office today for testosterone injection. Denies chest pain, shortness of breath, headaches and problems associated with taking this medication. Patient states he has had no abnornal mood swings, does state he has been having some anxiety latelty. Advised Pt to schedule a follow up with his PCP regarding those new symptoms.    Patient BP was elevated in office today, he does have the following home readings: 08-13-16: 136/82 08-03-16: 123/72 (rt arm), 129/73 (lt arm) 08-02-16: 149/79 07-30-16: 142/85 (lt arm), 137/74 (rt arm)  Patient tolerated injection in [place] well without complications. Patient advised to schedule his next injection for 3 weeks from today. Verbalized understanding.

## 2016-09-11 ENCOUNTER — Ambulatory Visit (INDEPENDENT_AMBULATORY_CARE_PROVIDER_SITE_OTHER): Payer: Medicare PPO | Admitting: Family Medicine

## 2016-09-11 VITALS — BP 148/73 | HR 65 | Ht 66.25 in | Wt 282.0 lb

## 2016-09-11 DIAGNOSIS — E349 Endocrine disorder, unspecified: Secondary | ICD-10-CM | POA: Diagnosis not present

## 2016-09-11 MED ORDER — TESTOSTERONE CYPIONATE 200 MG/ML IM SOLN
200.0000 mg | INTRAMUSCULAR | Status: DC
  Administered 2016-09-11: 200 mg via INTRAMUSCULAR

## 2016-09-11 NOTE — Progress Notes (Signed)
   Subjective:    Patient ID: Jonathan Yu, male    DOB: 01-07-1951, 66 y.o.   MRN: [id number]  HPI Pt is here for a testosterone injection. Denies chest pain, shortness of breath, or medication problems.   Review of Systems  Respiratory: Negative for shortness of breath.   Cardiovascular: Negative for chest pain.       Objective:   Physical Exam        Assessment & Plan:  Pt tolerated injection well without complications. Pt advised to schedule next injection in 21 days.

## 2016-09-12 ENCOUNTER — Encounter (INDEPENDENT_AMBULATORY_CARE_PROVIDER_SITE_OTHER): Payer: Self-pay | Admitting: Orthopedic Surgery

## 2016-09-12 ENCOUNTER — Ambulatory Visit (INDEPENDENT_AMBULATORY_CARE_PROVIDER_SITE_OTHER): Payer: Medicare PPO | Admitting: Orthopedic Surgery

## 2016-09-12 VITALS — Ht 66.0 in | Wt 282.0 lb

## 2016-09-12 DIAGNOSIS — Z96661 Presence of right artificial ankle joint: Secondary | ICD-10-CM | POA: Diagnosis not present

## 2016-09-12 DIAGNOSIS — I87323 Chronic venous hypertension (idiopathic) with inflammation of bilateral lower extremity: Secondary | ICD-10-CM | POA: Insufficient documentation

## 2016-09-12 DIAGNOSIS — M1712 Unilateral primary osteoarthritis, left knee: Secondary | ICD-10-CM

## 2016-09-12 MED ORDER — METHYLPREDNISOLONE ACETATE 40 MG/ML IJ SUSP
40.0000 mg | INTRAMUSCULAR | Status: AC | PRN
  Administered 2016-09-12: 40 mg via INTRA_ARTICULAR

## 2016-09-12 MED ORDER — LIDOCAINE HCL 1 % IJ SOLN
5.0000 mL | INTRAMUSCULAR | Status: AC | PRN
  Administered 2016-09-12: 5 mL

## 2016-09-12 NOTE — Progress Notes (Signed)
Office Visit Note   Patient: Jonathan Yu           Date of Birth: [birth year]           MRN: [id number] Visit Date: 09/12/2016              Requested by: Gregor Hams, MD [place] [address] [place], [place] [address] PCP: Lynne Leader, MD  Chief Complaint  Patient presents with  . Right Ankle - Follow-up    S/p a right ankle hardware removal 10/07/13 and irrigation and debridement on 11/18/13    HPI: Pt states that he is here for a routine check up. Right ankle is doing well and he does not have any questions today. He states that he does notice some swelling with significant increase in activity but otherwise he is doing well and has no concerns. Autumn L Forrest, RMA  Patient complains of increased swelling in both lower extremities complains of effusion of the left knee with mechanical symptoms. Patient is asymptomatic with his right total ankle.  Assessment & Plan: Visit Diagnoses:  1. Unilateral primary osteoarthritis, left knee   2. History of total ankle replacement, right   3. Idiopathic chronic venous hypertension of both lower extremities with inflammation     Plan: Recommended knee-high 15-20 mm compression stockings to be worn daily. Injection of the left knee discussed that he may require repeat injection. Follow-up as needed  Follow-Up Instructions: Return if symptoms worsen or fail to improve.   Ortho Exam Examination patient is alert oriented no adenopathy well-dressed normal affect normal respiratory effort he has a normal gait. Exam his left knee has a moderate effusion he is tender to palpation of the medial and lateral joint line closing cruciate are stable there is crepitation with range of motion of the left knee. Examination patient has pain-free range of motion of the right ankle his foot is plantar grade he has good subtalar motion. Examination patient is pitting edema with brawny skin color changes with venous insufficiency bilateral lower extremities  no ulcers no cellulitis.  Imaging: No results found.  Orders:  No orders of the defined types were placed in this encounter.  No orders of the defined types were placed in this encounter.    Procedures: Large Joint Inj Date/Time: 09/12/2016 3:45 PM Performed by: DUDA, MARCUS V Authorized by: Newt Minion   Consent Given by:  Patient Site marked: the procedure site was marked   Timeout: prior to procedure the correct patient, procedure, and site was verified   Indications:  Pain and diagnostic evaluation Location:  Knee Site:  L knee Prep: patient was prepped and draped in usual sterile fashion   Needle Size:  22 G Needle Length:  1.5 inches Approach:  Anteromedial Ultrasound Guidance: No   Fluoroscopic Guidance: No   Arthrogram: No   Medications:  5 mL lidocaine 1 %; 40 mg methylPREDNISolone acetate 40 MG/ML Aspiration Attempted: No   Patient tolerance:  Patient tolerated the procedure well with no immediate complications    Clinical Data: No additional findings.  Subjective: Review of Systems  Objective: Vital Signs: Ht 5\' 6"  (1.676 m)   Wt 282 lb (127.9 kg)   BMI 45.52 kg/m   Specialty Comments:  No specialty comments available.  PMFS History: Patient Active Problem List   Diagnosis Date Noted  . Unilateral primary osteoarthritis, left knee 09/12/2016  . Idiopathic chronic venous hypertension of both lower extremities with inflammation 09/12/2016  . Muscle  spasm 03/07/2016  . Gastritis 01/02/2016  . Obesity 12/21/2013  . Osteomyelitis ([place]) 12/21/2013  . Acute osteomyelitis of right fibula ([place]) 11/18/2013  . Infected hardware in right leg ([place]) 10/08/2013  . History of total ankle replacement, right 08/18/2013  . ED (erectile dysfunction) 09/10/2011  . BPH (benign prostatic hyperplasia) 09/10/2011  . Kidney stones 01/28/2011  . Testosterone deficiency 01/24/2011  . OSA on CPAP 01/24/2011  . Right lumbar radiculopathy 01/24/2011  . Essential  hypertension, benign 12/20/2010  . Fatigue 12/20/2010  . Dyslipidemia 12/20/2010   Past Medical History:  Diagnosis Date  . Allergy   . DDD (degenerative disc disease), lumbar   . Hyperlipidemia    "no meds since losing 106# 2 yr ago" (10/06/2013)  . Hypertension 2012   "no meds since losing 106# 2 yr ago" (10/06/2013)  . OSA on CPAP    "don't wear mask much since losing 106# 2 yr ago" (10/06/2013)  . Type II diabetes mellitus ([place])    "no meds since losing 106# 2 yr ago" (10/06/2013)  . Walking pneumonia 1968    Family History  Problem Relation Age of Onset  . Cancer Mother 57    Lung    Past Surgical History:  Procedure Laterality Date  . ANKLE FRACTURE SURGERY Left 1973  . ANKLE RECONSTRUCTION Right 05/2011  . CARPAL TUNNEL RELEASE Bilateral 2001  . HARDWARE REMOVAL Right 10/07/2013   Procedure: RIGHT ANKLE HARDWARE REMOVAL;  Surgeon: Newt Minion, MD;  Location: [facility];  Service: Orthopedics;  Laterality: Right;  . I&D EXTREMITY Right 11/18/2013   Procedure: IRRIGATION AND DEBRIDEMENT EXTREMITY;  Surgeon: Newt Minion, MD;  Location: [facility];  Service: Orthopedics;  Laterality: Right;  Irrigation and Debridement Right Fibula , Place Antibiotic Beads and VAC  . NASAL SEPTUM SURGERY  1982  . ORIF ANKLE FRACTURE Right 09/08/2013   Procedure: REPAIR SYNDESMOSIS DISRUPTION RIGHT ANKLE;  Surgeon: Newt Minion, MD;  Location: [facility];  Service: Orthopedics;  Laterality: Right;  . SHOULDER ARTHROSCOPY W/ ROTATOR CUFF REPAIR Right 2006; 2009  . TONSILLECTOMY  1959  . TOTAL ANKLE ARTHROPLASTY Right 08/18/2013   Procedure: TOTAL ANKLE ARTHOPLASTY;  Surgeon: Newt Minion, MD;  Location: [facility];  Service: Orthopedics;  Laterality: Right;  Right Total Ankle Arthroplasty, Revision Fibular Fracture  . URETERAL STENT PLACEMENT  ~ 2010 X 2   Social History   Occupational History  . sales/ DJ    Social History Main Topics  . Smoking status: Current Some Day Smoker    Years: 6.00    Types: Pipe,  Cigars  . Smokeless tobacco: Never Used     Comment: 10/06/2013 "use pipe or cigar 1-2 times/month"  . Alcohol use Yes     Comment: 10/06/2013 "have 4-5 drinks/yr"  . Drug use: No  . Sexual activity: Yes     Comment: 1 every 2 months

## 2016-10-02 ENCOUNTER — Ambulatory Visit: Payer: Medicare PPO

## 2016-10-02 ENCOUNTER — Encounter: Payer: Self-pay | Admitting: Family Medicine

## 2016-10-02 ENCOUNTER — Ambulatory Visit (INDEPENDENT_AMBULATORY_CARE_PROVIDER_SITE_OTHER): Payer: Medicare PPO

## 2016-10-02 ENCOUNTER — Ambulatory Visit (INDEPENDENT_AMBULATORY_CARE_PROVIDER_SITE_OTHER): Payer: Medicare PPO | Admitting: Family Medicine

## 2016-10-02 VITALS — BP 137/72 | HR 79 | Temp 99.0°F | Wt 276.0 lb

## 2016-10-02 DIAGNOSIS — E291 Testicular hypofunction: Secondary | ICD-10-CM | POA: Diagnosis not present

## 2016-10-02 DIAGNOSIS — R059 Cough, unspecified: Secondary | ICD-10-CM

## 2016-10-02 DIAGNOSIS — R05 Cough: Secondary | ICD-10-CM | POA: Diagnosis not present

## 2016-10-02 MED ORDER — TESTOSTERONE CYPIONATE 200 MG/ML IM SOLN
200.0000 mg | Freq: Once | INTRAMUSCULAR | Status: AC
  Administered 2016-10-02: 200 mg via INTRAMUSCULAR

## 2016-10-02 MED ORDER — GUAIFENESIN-CODEINE 100-10 MG/5ML PO SOLN
5.0000 mL | Freq: Every evening | ORAL | 0 refills | Status: DC | PRN
Start: 2016-10-02 — End: 2016-10-23

## 2016-10-02 MED ORDER — ALBUTEROL SULFATE HFA 108 (90 BASE) MCG/ACT IN AERS: 2.0000 | INHALATION_SPRAY | Freq: Four times a day (QID) | RESPIRATORY_TRACT | 0 refills | Status: DC | PRN

## 2016-10-02 MED ORDER — BENZONATATE 200 MG PO CAPS: 200.0000 mg | ORAL_CAPSULE | Freq: Three times a day (TID) | ORAL | 0 refills | Status: DC | PRN

## 2016-10-02 MED ORDER — PREDNISONE 10 MG PO TABS: 30.0000 mg | ORAL_TABLET | Freq: Every day | ORAL | 0 refills | Status: DC

## 2016-10-02 NOTE — Progress Notes (Signed)
Jonathan Yu is a 66 y.o. male who presents to [facility] today for cough congestion wheezing shortness of breath chills and fever. Symptoms present for about a week. Patient notes positive sick contacts at home. He smokes cigars but not cigarettes. He denies any personal history of asthma but has wheezed previously. He denies vomiting diarrhea chest pain palpitation or severe shortness of breath.   Past Medical History:  Diagnosis Date  . Allergy   . DDD (degenerative disc disease), lumbar   . Hyperlipidemia    "no meds since losing 106# 2 yr ago" (10/06/2013)  . Hypertension 2012   "no meds since losing 106# 2 yr ago" (10/06/2013)  . OSA on CPAP    "don't wear mask much since losing 106# 2 yr ago" (10/06/2013)  . Type II diabetes mellitus ([place])    "no meds since losing 106# 2 yr ago" (10/06/2013)  . Walking pneumonia 1968   Past Surgical History:  Procedure Laterality Date  . ANKLE FRACTURE SURGERY Left 1973  . ANKLE RECONSTRUCTION Right 05/2011  . CARPAL TUNNEL RELEASE Bilateral 2001  . HARDWARE REMOVAL Right 10/07/2013   Procedure: RIGHT ANKLE HARDWARE REMOVAL;  Surgeon: Newt Minion, MD;  Location: [facility];  Service: Orthopedics;  Laterality: Right;  . I&D EXTREMITY Right 11/18/2013   Procedure: IRRIGATION AND DEBRIDEMENT EXTREMITY;  Surgeon: Newt Minion, MD;  Location: [facility];  Service: Orthopedics;  Laterality: Right;  Irrigation and Debridement Right Fibula , Place Antibiotic Beads and VAC  . NASAL SEPTUM SURGERY  1982  . ORIF ANKLE FRACTURE Right 09/08/2013   Procedure: REPAIR SYNDESMOSIS DISRUPTION RIGHT ANKLE;  Surgeon: Newt Minion, MD;  Location: [facility];  Service: Orthopedics;  Laterality: Right;  . SHOULDER ARTHROSCOPY W/ ROTATOR CUFF REPAIR Right 2006; 2009  . TONSILLECTOMY  1959  . TOTAL ANKLE ARTHROPLASTY Right 08/18/2013   Procedure: TOTAL ANKLE  ARTHOPLASTY;  Surgeon: Newt Minion, MD;  Location: [facility];  Service: Orthopedics;  Laterality: Right;  Right Total Ankle Arthroplasty, Revision Fibular Fracture  . URETERAL STENT PLACEMENT  ~ 2010 X 2   Social History  Substance Use Topics  . Smoking status: Current Some Day Smoker    Years: 6.00    Types: Pipe, Cigars  . Smokeless tobacco: Never Used     Comment: 10/06/2013 "use pipe or cigar 1-2 times/month"  . Alcohol use Yes     Comment: 10/06/2013 "have 4-5 drinks/yr"   family history includes Cancer (age of onset: 54) in his mother.  ROS as above:  Medications: Current Outpatient Prescriptions  Medication Sig Dispense Refill  . albuterol (PROVENTIL HFA;VENTOLIN HFA) 108 (90 Base) MCG/ACT inhaler Inhale 2 puffs into the lungs every 6 (six) hours as needed for wheezing or shortness of breath. 1 Inhaler 0  . benzonatate (TESSALON) 200 MG capsule Take 1 capsule (200 mg total) by mouth 3 (three) times daily as needed for cough. 45 capsule 0  . cyclobenzaprine (FLEXERIL) 10 MG tablet Take 1 tablet (10 mg total) by mouth 3 (three) times daily as needed for muscle spasms. 90 tablet 1  . guaiFENesin-codeine 100-10 MG/5ML syrup Take 5 mLs by mouth at bedtime as needed for cough. 120 mL 0  . ibuprofen (ADVIL,MOTRIN) 200 MG tablet Take 200 mg by mouth every 6 (six) hours as needed.    . Misc. Devices (FLEX THERAPY) MISC by Does not apply route.    . Multiple Vitamin (MULTIVITAMIN  WITH MINERALS) TABS tablet Take 1 tablet by mouth daily.    . naproxen sodium (ALEVE) 220 MG tablet Take 220 mg by mouth 2 (two) times daily with a meal.    . Omega-3 Fatty Acids (FISH OIL PO) Take 1 capsule by mouth daily.    Marland Kitchen OVER THE COUNTER MEDICATION Take 1 tablet by mouth 4 (four) times daily - after meals and at bedtime. -pH balance formula Alkolete Yoli    . predniSONE (DELTASONE) 10 MG tablet Take 3 tablets (30 mg total) by mouth daily with breakfast. 15 tablet 0  . sildenafil (REVATIO) 20 MG tablet 1-4  tabs by mouth in a 24 hour period as needed for intercourse 30 tablet 2  . sildenafil (VIAGRA) 100 MG tablet Take 0.5-1 tablets (50-100 mg total) by mouth daily as needed for erectile dysfunction. 6 tablet 11  . testosterone cypionate (DEPOTESTOSTERONE CYPIONATE) 200 MG/ML injection Inject 1 mL (200 mg total) into the muscle every 21 ( twenty-one) days. 10 mL 0   No current facility-administered medications for this visit.    Allergies  Allergen Reactions  . Sesame Oil Hives and Shortness Of Breath  . Topamax [Topiramate]     Cognitive decline  . Percocet [Oxycodone-Acetaminophen] Rash  . Vicodin [Hydrocodone-Acetaminophen] Rash    Health Maintenance Health Maintenance  Topic Date Due  . HIV Screening  09/17/1965  . COLONOSCOPY  12/18/2019  . TETANUS/TDAP  04/24/2021  . INFLUENZA VACCINE  Completed  . Hepatitis C Screening  Completed     Exam:  BP 137/72   Pulse 79   Temp 99 F (37.2 C) (Oral)   Wt 276 lb (125.2 kg)   SpO2 96%   BMI 44.55 kg/m  Gen: Well NAD Nontoxic appearing HEENT: EOMI,  MMM clear nasal discharge Lungs: Normal work of breathing. Wheezing present bilaterally with prolonged expiratory phase. Heart: RRR no MRG Abd: NABS, Soft. Nondistended, Nontender Exts: Brisk capillary refill, warm and well perfused.    No results found for this or any previous visit (from the past 72 hour(s)). Dg Chest 2 View  Result Date: 10/02/2016 CLINICAL DATA:  Cough, congestion, fatigue EXAM: CHEST  2 VIEW COMPARISON:  Chest x-ray of 08/13/2013 FINDINGS: No active infiltrate or effusion is seen. Mediastinal and hilar contours are unremarkable. The heart is borderline enlarged. There is some peribronchial thickening which may indicate bronchitis. There are degenerative changes throughout the thoracic spine. IMPRESSION: 1. No pneumonia or effusion. 2. Cannot exclude bronchitis. Electronically Signed   By: Ivar Drape M.D.   On: 10/02/2016 15:41      Assessment and Plan: 66  y.o. male with viral illness possibly flu complicated by bronchitis or asthma exacerbation. Plan to treat with prednisone albuterol Tessalon Perles and codeine cough syrup. Chest x-ray pending.  Patient was given routine testosterone injection today as per usual.   Orders Placed This Encounter  Procedures  . DG Chest 2 View    Order Specific Question:   Reason for exam:    Answer:   Cough, assess intra-thoracic pathology    Order Specific Question:   Preferred imaging location?    Answer:   [facility]   Meds ordered this encounter  Medications  . predniSONE (DELTASONE) 10 MG tablet    Sig: Take 3 tablets (30 mg total) by mouth daily with breakfast.    Dispense:  15 tablet    Refill:  0  . albuterol (PROVENTIL HFA;VENTOLIN HFA) 108 (90 Base) MCG/ACT inhaler    Sig: Inhale  2 puffs into the lungs every 6 (six) hours as needed for wheezing or shortness of breath.    Dispense:  1 Inhaler    Refill:  0  . benzonatate (TESSALON) 200 MG capsule    Sig: Take 1 capsule (200 mg total) by mouth 3 (three) times daily as needed for cough.    Dispense:  45 capsule    Refill:  0  . guaiFENesin-codeine 100-10 MG/5ML syrup    Sig: Take 5 mLs by mouth at bedtime as needed for cough.    Dispense:  120 mL    Refill:  0  . testosterone cypionate (DEPOTESTOSTERONE CYPIONATE) injection 200 mg     Discussed warning signs or symptoms. Please see discharge instructions. Patient expresses understanding.

## 2016-10-02 NOTE — Patient Instructions (Signed)
Thank you for coming in today. Take the cough medicines.  Take prednisone.  Get xray today.  Call or go to the emergency room if you get worse, have trouble breathing, have chest pains, or palpitations.    Acute Bronchitis, Adult Acute bronchitis is when air tubes (bronchi) in the lungs suddenly get swollen. The condition can make it hard to breathe. It can also cause these symptoms:  A cough.  Coughing up clear, yellow, or green mucus.  Wheezing.  Chest congestion.  Shortness of breath.  A fever.  Body aches.  Chills.  A sore throat. Follow these instructions at home: Medicines  Take over-the-counter and prescription medicines only as told by your doctor.  If you were prescribed an antibiotic medicine, take it as told by your doctor. Do not stop taking the antibiotic even if you start to feel better. General instructions  Rest.  Drink enough fluids to keep your pee (urine) clear or pale yellow.  Avoid smoking and secondhand smoke. If you smoke and you need help quitting, ask your doctor. Quitting will help your lungs heal faster.  Use an inhaler, cool mist vaporizer, or humidifier as told by your doctor.  Keep all follow-up visits as told by your doctor. This is important. How is this prevented? To lower your risk of getting this condition again:  Wash your hands often with soap and water. If you cannot use soap and water, use hand sanitizer.  Avoid contact with people who have cold symptoms.  Try not to touch your hands to your mouth, nose, or eyes.  Make sure to get the flu shot every year. Contact a doctor if:  Your symptoms do not get better in 2 weeks. Get help right away if:  You cough up blood.  You have chest pain.  You have very bad shortness of breath.  You become dehydrated.  You faint (pass out) or keep feeling like you are going to pass out.  You keep throwing up (vomiting).  You have a very bad headache.  Your fever or chills  gets worse. This information is not intended to replace advice given to you by your health care provider. Make sure you discuss any questions you have with your health care provider. Document Released: 01/15/2008 Document Revised: 03/06/2016 Document Reviewed: 01/17/2016 Elsevier Interactive Patient Education  2017 [employer].

## 2016-10-23 ENCOUNTER — Ambulatory Visit: Payer: Medicare PPO

## 2016-10-23 ENCOUNTER — Ambulatory Visit (INDEPENDENT_AMBULATORY_CARE_PROVIDER_SITE_OTHER): Payer: Medicare PPO | Admitting: Family Medicine

## 2016-10-23 VITALS — BP 142/86 | HR 72

## 2016-10-23 VITALS — BP 144/86 | HR 77 | Temp 98.2°F | Ht 66.0 in | Wt 268.7 lb

## 2016-10-23 DIAGNOSIS — E291 Testicular hypofunction: Secondary | ICD-10-CM | POA: Diagnosis not present

## 2016-10-23 DIAGNOSIS — Z0000 Encounter for general adult medical examination without abnormal findings: Secondary | ICD-10-CM

## 2016-10-23 DIAGNOSIS — H9190 Unspecified hearing loss, unspecified ear: Secondary | ICD-10-CM

## 2016-10-23 MED ORDER — TESTOSTERONE CYPIONATE 200 MG/ML IM SOLN
200.0000 mg | Freq: Once | INTRAMUSCULAR | Status: AC
  Administered 2016-10-23: 200 mg via INTRAMUSCULAR

## 2016-10-23 NOTE — Progress Notes (Unsigned)
Subjective:   Jonathan Yu is a 66 y.o. male who presents for Medicare Annual/Subsequent preventive examination.  Review of Systems:  Cardiac Risk Factors include: advanced age (>75men, >39 women);diabetes mellitus;smoking/ tobacco exposure;obesity (BMI >30kg/m2);hypertension;male gender;sedentary lifestyle;dyslipidemia     Objective:    Vitals: BP (!) 144/86 (BP Location: Right Arm, Patient Position: Sitting, Cuff Size: Normal)   Pulse 77   Temp 98.2 F (36.8 C) (Oral)   Ht 5\' 6"  (1.676 m)   Wt 268 lb 11.2 oz (121.9 kg)   SpO2 97%   BMI 43.37 kg/m   Body mass index is 43.37 kg/m.  Tobacco History  Smoking Status  . Current Some Day Smoker  . Years: 6.00  . Types: Pipe, Cigars  Smokeless Tobacco  . Never Used    Comment: 10/06/2013 "use pipe or cigar 1-2 times/month"     Ready to quit: No Counseling given: No   Past Medical History:  Diagnosis Date  . Allergy   . DDD (degenerative disc disease), lumbar   . Hyperlipidemia    "no meds since losing 106# 2 yr ago" (10/06/2013)  . Hypertension 2012   "no meds since losing 106# 2 yr ago" (10/06/2013)  . OSA on CPAP    "don't wear mask much since losing 106# 2 yr ago" (10/06/2013)  . Type II diabetes mellitus ([place])    "no meds since losing 106# 2 yr ago" (10/06/2013)  . Walking pneumonia 1968   Past Surgical History:  Procedure Laterality Date  . ANKLE FRACTURE SURGERY Left 1973  . ANKLE RECONSTRUCTION Right 05/2011  . CARPAL TUNNEL RELEASE Bilateral 2001  . HARDWARE REMOVAL Right 10/07/2013   Procedure: RIGHT ANKLE HARDWARE REMOVAL;  Surgeon: Newt Minion, MD;  Location: [facility];  Service: Orthopedics;  Laterality: Right;  . I&D EXTREMITY Right 11/18/2013   Procedure: IRRIGATION AND DEBRIDEMENT EXTREMITY;  Surgeon: Newt Minion, MD;  Location: [facility];  Service: Orthopedics;  Laterality: Right;  Irrigation and Debridement Right Fibula , Place Antibiotic Beads and VAC  . NASAL SEPTUM SURGERY  1982  . ORIF ANKLE  FRACTURE Right 09/08/2013   Procedure: REPAIR SYNDESMOSIS DISRUPTION RIGHT ANKLE;  Surgeon: Newt Minion, MD;  Location: [facility];  Service: Orthopedics;  Laterality: Right;  . SHOULDER ARTHROSCOPY W/ ROTATOR CUFF REPAIR Right 2006; 2009  . TONSILLECTOMY  1959  . TOTAL ANKLE ARTHROPLASTY Right 08/18/2013   Procedure: TOTAL ANKLE ARTHOPLASTY;  Surgeon: Newt Minion, MD;  Location: [facility];  Service: Orthopedics;  Laterality: Right;  Right Total Ankle Arthroplasty, Revision Fibular Fracture  . URETERAL STENT PLACEMENT  ~ 2010 X 2   Family History  Problem Relation Age of Onset  . Cancer Mother 39    Lung  . Alzheimer's disease Father   . Parkinson's disease Father   . Scoliosis Sister   . Cleft lip Sister    History  Sexual Activity  . Sexual activity: Yes    Comment: 1 every 2 months    Outpatient Encounter Prescriptions as of 10/23/2016  Medication Sig  . cyclobenzaprine (FLEXERIL) 10 MG tablet Take 1 tablet (10 mg total) by mouth 3 (three) times daily as needed for muscle spasms.  Marland Kitchen ibuprofen (ADVIL,MOTRIN) 200 MG tablet Take 200 mg by mouth every 6 (six) hours as needed.  . loratadine (CLARITIN) 10 MG tablet Take 10 mg by mouth daily.  . Misc. Devices (FLEX THERAPY) MISC by Does not apply route.  . Multiple Vitamin (MULTIVITAMIN WITH MINERALS) TABS tablet Take  1 tablet by mouth daily.  . naproxen sodium (ALEVE) 220 MG tablet Take 220 mg by mouth 2 (two) times daily with a meal.  . Omega-3 Fatty Acids (FISH OIL PO) Take 1 capsule by mouth daily.  Marland Kitchen OVER THE COUNTER MEDICATION Take 1 tablet by mouth 4 (four) times daily - after meals and at bedtime. -pH balance formula Alkolete Yoli  . sildenafil (REVATIO) 20 MG tablet 1-4 tabs by mouth in a 24 hour period as needed for intercourse  . testosterone cypionate (DEPOTESTOSTERONE CYPIONATE) 200 MG/ML injection Inject 1 mL (200 mg total) into the muscle every 21 ( twenty-one) days.  . [DISCONTINUED] albuterol (PROVENTIL HFA;VENTOLIN HFA) 108  (90 Base) MCG/ACT inhaler Inhale 2 puffs into the lungs every 6 (six) hours as needed for wheezing or shortness of breath.  . [DISCONTINUED] benzonatate (TESSALON) 200 MG capsule Take 1 capsule (200 mg total) by mouth 3 (three) times daily as needed for cough.  . [DISCONTINUED] guaiFENesin-codeine 100-10 MG/5ML syrup Take 5 mLs by mouth at bedtime as needed for cough.  . [DISCONTINUED] predniSONE (DELTASONE) 10 MG tablet Take 3 tablets (30 mg total) by mouth daily with breakfast.  . [DISCONTINUED] sildenafil (VIAGRA) 100 MG tablet Take 0.5-1 tablets (50-100 mg total) by mouth daily as needed for erectile dysfunction.   No facility-administered encounter medications on file as of 10/23/2016.     Activities of Daily Living In your present state of health, do you have any difficulty performing the following activities: 10/23/2016  Hearing? Y  Vision? N  Difficulty concentrating or making decisions? N  Walking or climbing stairs? Y  Dressing or bathing? N  Doing errands, shopping? N  Preparing Food and eating ? N  Using the Toilet? N  In the past six months, have you accidently leaked urine? N  Do you have problems with loss of bowel control? N  Managing your Medications? N  Managing your Finances? N  Housekeeping or managing your Housekeeping? N  Some recent data might be hidden    Patient Care Team: Gregor Hams, MD as PCP - General (Family Medicine)   Assessment:    Exercise Activities and Dietary recommendations Current Exercise Habits: The patient does not participate in regular exercise at present, Exercise limited by: orthopedic condition(s)  Goals    . Quit smoking / using tobacco    . Weight (lb) < 197 lb (89.4 kg)          Starting 10/23/16, I will attempt to decrease my current weight to reach my goal weight of 197 lb. I would also like to increase the mobility of my ankle and both my shoulders.       Fall Risk Fall Risk  10/23/2016 11/01/2014  Falls in the past year?  Yes No  Number falls in past yr: 2 or more -  Injury with Fall? Yes -  Risk for fall due to : Impaired balance/gait -  Follow up Falls prevention discussed -   Depression Screen PHQ 2/9 Scores 10/23/2016 11/01/2014  PHQ - 2 Score 1 0   Hearing Screening Comments: Last hearing screen was done several years ago. Pt has noticed a decreased in hearing and would like a referral for audiology.  Vision Screening Comments: Last eye exam was done last year, with Dr. Serina Cowper at [facility]. Pt states he will contact them to get his eye exam.   Cognitive Function MMSE - Mini Mental State Exam 10/23/2016  Not completed: (No Data)  Immunization History  Administered Date(s) Administered  . Influenza Split 07/16/2012  . Influenza Whole 04/25/2011  . Influenza,inj,Quad PF,36+ Mos 03/23/2014, 06/10/2014, 09/29/2015, 03/28/2016  . Influenza-Unspecified 05/12/2013  . Pneumococcal Conjugate-13 06/16/2009  . Tdap 04/25/2011  . Zoster 10/11/2014   Screening Tests Health Maintenance  Topic Date Due  . HIV Screening  09/17/1965  . COLONOSCOPY  12/18/2019  . TETANUS/TDAP  04/24/2021  . INFLUENZA VACCINE  Completed  . Hepatitis C Screening  Completed      Plan:    I have personally reviewed and addressed the Medicare Annual Wellness questionnaire and have noted the following in the patient's chart:  A. Medical and social history B. Use of alcohol, tobacco or illicit drugs  C. Current medications and supplements D. Functional ability and status E.  Nutritional status F.  Physical activity G. Advance directives H. List of other physicians I.  Hospitalizations, surgeries, and ER visits in previous 12 months J.  [place] to include hearing, vision, cognitive, depression L. Referrals and appointments - Referral for audiology (decreased hearing)                   and C3 referral for assistance for CPAP equipment.   In addition, I have reviewed and discussed with patient certain  preventive protocols, quality metrics, and best practice recommendations. A written personalized care plan for preventive services as well as general preventive health recommendations were provided to patient.  See attached scanned questionnaire for additional information.   Signed,   Allyn Kenner, LPN Health Advisor

## 2016-10-23 NOTE — Progress Notes (Unsigned)
Quick Notes   Health Maintenance:  Pt due for HIV screening.   Abnormal Screen: None; Not of age for cognitive functions.     Patient Concerns:  Pt has starting taking Claritin due to some sinus drainage, states it seems to be working. Pt also would like to discuss skin spots that have started to come up.     Nurse Concerns:  None

## 2016-10-23 NOTE — Patient Instructions (Addendum)
Jonathan Yu , Thank you for taking time to come for your Medicare Wellness Visit. I appreciate your ongoing commitment to your health goals. Please review the following plan we discussed and let me know if I can assist you in the future.   Screening recommendations/referrals: Colonoscopy: Due 12/18/2019 Recommended yearly ophthalmology/optometry visit for glaucoma screening and checkup Recommended yearly dental visit for hygiene and checkup  Vaccinations: Influenza vaccine: Due 03/28/2017 Pneumococcal vaccine: Due at age 84 Tdap vaccine: Due 04/24/2021 Shingles vaccine: Due Now  Advanced directives: Please fill out, sign and have notarized.   Conditions/risks identified: Discussed skin spots Next appointment: 11/13/16 for your next injection  Preventive Care 40-64 Years, Male Preventive care refers to lifestyle choices and visits with your health care provider that can promote health and wellness. What does preventive care include?  A yearly physical exam. This is also called an annual well check.  Dental exams once or twice a year.  Routine eye exams. Ask your health care provider how often you should have your eyes checked.  Personal lifestyle choices, including:  Daily care of your teeth and gums.  Regular physical activity.  Eating a healthy diet.  Avoiding tobacco and drug use.  Limiting alcohol use.  Practicing safe sex.  Taking low-dose aspirin every day starting at age 32. What happens during an annual well check? The services and screenings done by your health care provider during your annual well check will depend on your age, overall health, lifestyle risk factors, and family history of disease. Counseling  Your health care provider may ask you questions about your:  Alcohol use.  Tobacco use.  Drug use.  Emotional well-being.  Home and relationship well-being.  Sexual activity.  Eating habits.  Work and work Statistician. Screening  You may have  the following tests or measurements:  Height, weight, and BMI.  Blood pressure.  Lipid and cholesterol levels. These may be checked every 5 years, or more frequently if you are over 32 years old.  Skin check.  Lung cancer screening. You may have this screening every year starting at age 9 if you have a 30-pack-year history of smoking and currently smoke or have quit within the past 15 years.  Fecal occult blood test (FOBT) of the stool. You may have this test every year starting at age 76.  Flexible sigmoidoscopy or colonoscopy. You may have a sigmoidoscopy every 5 years or a colonoscopy every 10 years starting at age 36.  Prostate cancer screening. Recommendations will vary depending on your family history and other risks.  Hepatitis C blood test.  Hepatitis B blood test.  Sexually transmitted disease (STD) testing.  Diabetes screening. This is done by checking your blood sugar (glucose) after you have not eaten for a while (fasting). You may have this done every 1-3 years. Discuss your test results, treatment options, and if necessary, the need for more tests with your health care provider. Vaccines  Your health care provider may recommend certain vaccines, such as:  Influenza vaccine. This is recommended every year.  Tetanus, diphtheria, and acellular pertussis (Tdap, Td) vaccine. You may need a Td booster every 10 years.  Zoster vaccine. You may need this after age 50.  Pneumococcal 13-valent conjugate (PCV13) vaccine. You may need this if you have certain conditions and have not been vaccinated.  Pneumococcal polysaccharide (PPSV23) vaccine. You may need one or two doses if you smoke cigarettes or if you have certain conditions. Talk to your health care provider about which  screenings and vaccines you need and how often you need them. This information is not intended to replace advice given to you by your health care provider. Make sure you discuss any questions you have  with your health care provider. Document Released: 08/25/2015 Document Revised: 04/17/2016 Document Reviewed: 05/30/2015 Elsevier Interactive Patient Education  2017 [place] Prevention in the Home Falls can cause injuries. They can happen to people of all ages. There are many things you can do to make your home safe and to help prevent falls. What can I do on the outside of my home?  Regularly fix the edges of walkways and driveways and fix any cracks.  Remove anything that might make you trip as you walk through a door, such as a raised step or threshold.  Trim any bushes or trees on the path to your home.  Use bright outdoor lighting.  Clear any walking paths of anything that might make someone trip, such as rocks or tools.  Regularly check to see if handrails are loose or broken. Make sure that both sides of any steps have handrails.  Any raised decks and porches should have guardrails on the edges.  Have any leaves, snow, or ice cleared regularly.  Use sand or salt on walking paths during winter.  Clean up any spills in your garage right away. This includes oil or grease spills. What can I do in the bathroom?  Use night lights.  Install grab bars by the toilet and in the tub and shower. Do not use towel bars as grab bars.  Use non-skid mats or decals in the tub or shower.  If you need to sit down in the shower, use a plastic, non-slip stool.  Keep the floor dry. Clean up any water that spills on the floor as soon as it happens.  Remove soap buildup in the tub or shower regularly.  Attach bath mats securely with double-sided non-slip rug tape.  Do not have throw rugs and other things on the floor that can make you trip. What can I do in the bedroom?  Use night lights.  Make sure that you have a light by your bed that is easy to reach.  Do not use any sheets or blankets that are too big for your bed. They should not hang down onto the floor.  Have a  firm chair that has side arms. You can use this for support while you get dressed.  Do not have throw rugs and other things on the floor that can make you trip. What can I do in the kitchen?  Clean up any spills right away.  Avoid walking on wet floors.  Keep items that you use a lot in easy-to-reach places.  If you need to reach something above you, use a strong step stool that has a grab bar.  Keep electrical cords out of the way.  Do not use floor polish or wax that makes floors slippery. If you must use wax, use non-skid floor wax.  Do not have throw rugs and other things on the floor that can make you trip. What can I do with my stairs?  Do not leave any items on the stairs.  Make sure that there are handrails on both sides of the stairs and use them. Fix handrails that are broken or loose. Make sure that handrails are as long as the stairways.  Check any carpeting to make sure that it is firmly attached to the stairs.  Fix any carpet that is loose or worn.  Avoid having throw rugs at the top or bottom of the stairs. If you do have throw rugs, attach them to the floor with carpet tape.  Make sure that you have a light switch at the top of the stairs and the bottom of the stairs. If you do not have them, ask someone to add them for you. What else can I do to help prevent falls?  Wear shoes that:  Do not have high heels.  Have rubber bottoms.  Are comfortable and fit you well.  Are closed at the toe. Do not wear sandals.  If you use a stepladder:  Make sure that it is fully opened. Do not climb a closed stepladder.  Make sure that both sides of the stepladder are locked into place.  Ask someone to hold it for you, if possible.  Clearly mark and make sure that you can see:  Any grab bars or handrails.  First and last steps.  Where the edge of each step is.  Use tools that help you move around (mobility aids) if they are needed. These  include:  Canes.  Walkers.  Scooters.  Crutches.  Turn on the lights when you go into a dark area. Replace any light bulbs as soon as they burn out.  Set up your furniture so you have a clear path. Avoid moving your furniture around.  If any of your floors are uneven, fix them.  If there are any pets around you, be aware of where they are.  Review your medicines with your doctor. Some medicines can make you feel dizzy. This can increase your chance of falling. Ask your doctor what other things that you can do to help prevent falls. This information is not intended to replace advice given to you by your health care provider. Make sure you discuss any questions you have with your health care provider. Document Released: 05/25/2009 Document Revised: 01/04/2016 Document Reviewed: 09/02/2014 Elsevier Interactive Patient Education  2017 [employer].

## 2016-10-23 NOTE — Progress Notes (Signed)
Patient came into office today for testosterone injection. Denies chest pain, shortness of breath, headaches and problems associated with taking this medication. Patient states he has had no abnornal mood swings. Patient tolerated injection in RUOQ well without complications. Patient advised to schedule his next injection for 3 weeks from today.

## 2016-11-13 ENCOUNTER — Ambulatory Visit (INDEPENDENT_AMBULATORY_CARE_PROVIDER_SITE_OTHER): Payer: Medicare PPO | Admitting: Family Medicine

## 2016-11-13 VITALS — BP 143/80 | HR 66 | Ht 66.0 in | Wt 268.0 lb

## 2016-11-13 DIAGNOSIS — E349 Endocrine disorder, unspecified: Secondary | ICD-10-CM | POA: Diagnosis not present

## 2016-11-13 MED ORDER — TESTOSTERONE CYPIONATE 200 MG/ML IM SOLN
200.0000 mg | Freq: Once | INTRAMUSCULAR | Status: AC
  Administered 2016-11-13: 200 mg via INTRAMUSCULAR

## 2016-11-13 NOTE — Progress Notes (Signed)
   Subjective:    Patient ID: Jonathan Yu, male    DOB: [birth year], 66 y.o.   MRN: [id number]  HPI Pt is here for a Testosterone Injection today. Pt denies mood swings, palpitations, chest pain, or any medication problems.    Review of Systems     Objective:   Physical Exam        Assessment & Plan:  Pt tolerated injection in LUOQ well and without complications. Pt advised to schedule a nurse visit in 3  weeks.

## 2016-12-03 ENCOUNTER — Ambulatory Visit (INDEPENDENT_AMBULATORY_CARE_PROVIDER_SITE_OTHER): Payer: Medicare PPO | Admitting: Osteopathic Medicine

## 2016-12-03 VITALS — BP 131/78 | HR 87

## 2016-12-03 DIAGNOSIS — E291 Testicular hypofunction: Secondary | ICD-10-CM | POA: Diagnosis not present

## 2016-12-03 MED ORDER — TESTOSTERONE CYPIONATE 200 MG/ML IM SOLN
200.0000 mg | Freq: Once | INTRAMUSCULAR | Status: AC
  Administered 2016-12-03: 200 mg via INTRAMUSCULAR

## 2016-12-03 NOTE — Progress Notes (Signed)
Patient came into office today for testosterone injection. Denies chest pain, shortness of breath, headaches and problems associated with taking this medication. Patient states he has had no abnornal mood swings. Patient tolerated injection in RUOQ well without complications. Patient advised to schedule his next injection for 3 weeks from today.

## 2016-12-04 ENCOUNTER — Ambulatory Visit: Payer: Medicare PPO

## 2016-12-25 ENCOUNTER — Ambulatory Visit (INDEPENDENT_AMBULATORY_CARE_PROVIDER_SITE_OTHER): Payer: Medicare PPO | Admitting: Family Medicine

## 2016-12-25 VITALS — BP 148/71 | HR 60 | Ht 66.0 in | Wt 271.0 lb

## 2016-12-25 DIAGNOSIS — E349 Endocrine disorder, unspecified: Secondary | ICD-10-CM

## 2016-12-25 DIAGNOSIS — I87323 Chronic venous hypertension (idiopathic) with inflammation of bilateral lower extremity: Secondary | ICD-10-CM

## 2016-12-25 MED ORDER — TESTOSTERONE CYPIONATE 200 MG/ML IM SOLN
200.0000 mg | Freq: Once | INTRAMUSCULAR | Status: AC
  Administered 2016-12-25: 200 mg via INTRAMUSCULAR

## 2016-12-25 NOTE — Progress Notes (Signed)
   Subjective:    Patient ID: Jonathan Yu, male    DOB: [birth year], 66 y.o.   MRN: [id number]  HPI Pt is here for a Testosterone Injection today. Pt denies mood swings, palpitations, chest pain, or any medication problems.      Review of Systems     Objective:   Physical Exam        Assessment & Plan:  Pt tolerated injection in  LUOQ well and without complications. Pt advised to schedule a nurse visit in   weeks.

## 2016-12-25 NOTE — Patient Instructions (Signed)
Thank you for coming in today. Get labs tested fasting in the morning before 10am about midway between testosterone shots.  Make the next shot appointment a regular doctor visit with Dr Georgina Snell.  Keep a home blood pressure log.

## 2017-01-07 DIAGNOSIS — I87323 Chronic venous hypertension (idiopathic) with inflammation of bilateral lower extremity: Secondary | ICD-10-CM | POA: Diagnosis not present

## 2017-01-07 DIAGNOSIS — Z13220 Encounter for screening for lipoid disorders: Secondary | ICD-10-CM | POA: Diagnosis not present

## 2017-01-07 DIAGNOSIS — E349 Endocrine disorder, unspecified: Secondary | ICD-10-CM | POA: Diagnosis not present

## 2017-01-07 DIAGNOSIS — Z125 Encounter for screening for malignant neoplasm of prostate: Secondary | ICD-10-CM | POA: Diagnosis not present

## 2017-01-08 LAB — CBC
HCT: 47.8 % (ref 38.5–50.0)
Hemoglobin: 16 g/dL (ref 13.2–17.1)
MCH: 30.2 pg (ref 27.0–33.0)
MCHC: 33.5 g/dL (ref 32.0–36.0)
MCV: 90.4 fL (ref 80.0–100.0)
MPV: 9.7 fL (ref 7.5–12.5)
Platelets: 221 10*3/uL (ref 140–400)
RBC: 5.29 MIL/uL (ref 4.20–5.80)
RDW: 13.8 % (ref 11.0–15.0)
WBC: 7.5 10*3/uL (ref 3.8–10.8)

## 2017-01-08 LAB — LIPID PANEL W/REFLEX DIRECT LDL
Cholesterol: 166 mg/dL (ref <200)
HDL: 27 mg/dL — ABNORMAL LOW (ref >40)
LDL-Cholesterol: 110 mg/dL — ABNORMAL HIGH
Non-HDL Cholesterol (Calc): 139 mg/dL — ABNORMAL HIGH (ref <130)
Total CHOL/HDL Ratio: 6.1 Ratio — ABNORMAL HIGH (ref <5.0)
Triglycerides: 175 mg/dL — ABNORMAL HIGH (ref <150)

## 2017-01-08 LAB — COMPLETE METABOLIC PANEL WITH GFR
ALT: 29 U/L (ref 9–46)
AST: 24 U/L (ref 10–35)
Albumin: 3.5 g/dL — ABNORMAL LOW (ref 3.6–5.1)
Alkaline Phosphatase: 39 U/L — ABNORMAL LOW (ref 40–115)
BUN: 11 mg/dL (ref 7–25)
CO2: 26 mmol/L (ref 20–31)
Calcium: 8.8 mg/dL (ref 8.6–10.3)
Chloride: 106 mmol/L (ref 98–110)
Creat: 0.99 mg/dL (ref 0.70–1.25)
GFR, Est African American: >89 mL/min (ref >=60)
GFR, Est Non African American: 80 mL/min (ref >=60)
Glucose, Bld: 98 mg/dL (ref 65–99)
Potassium: 4 mmol/L (ref 3.5–5.3)
Sodium: 140 mmol/L (ref 135–146)
Total Bilirubin: 1.5 mg/dL — ABNORMAL HIGH (ref 0.2–1.2)
Total Protein: 6.1 g/dL (ref 6.1–8.1)

## 2017-01-08 LAB — PSA: PSA: 1.7 ng/mL (ref <=4.0)

## 2017-01-08 LAB — TESTOSTERONE: Testosterone: 355 ng/dL (ref 250–827)

## 2017-01-15 ENCOUNTER — Encounter: Payer: Self-pay | Admitting: Family Medicine

## 2017-01-15 ENCOUNTER — Ambulatory Visit (INDEPENDENT_AMBULATORY_CARE_PROVIDER_SITE_OTHER): Payer: Medicare PPO | Admitting: Family Medicine

## 2017-01-15 VITALS — BP 176/80 | HR 55 | Wt 272.0 lb

## 2017-01-15 DIAGNOSIS — I10 Essential (primary) hypertension: Secondary | ICD-10-CM

## 2017-01-15 DIAGNOSIS — E291 Testicular hypofunction: Secondary | ICD-10-CM | POA: Diagnosis not present

## 2017-01-15 DIAGNOSIS — N529 Male erectile dysfunction, unspecified: Secondary | ICD-10-CM | POA: Diagnosis not present

## 2017-01-15 DIAGNOSIS — E349 Endocrine disorder, unspecified: Secondary | ICD-10-CM

## 2017-01-15 DIAGNOSIS — E785 Hyperlipidemia, unspecified: Secondary | ICD-10-CM | POA: Diagnosis not present

## 2017-01-15 MED ORDER — SILDENAFIL CITRATE 20 MG PO TABS: ORAL_TABLET | ORAL | 12 refills | Status: DC

## 2017-01-15 MED ORDER — TESTOSTERONE CYPIONATE 200 MG/ML IM SOLN
200.0000 mg | Freq: Once | INTRAMUSCULAR | Status: AC
  Administered 2017-01-15: 200 mg via INTRAMUSCULAR

## 2017-01-15 MED ORDER — OLMESARTAN MEDOXOMIL 20 MG PO TABS: 20.0000 mg | ORAL_TABLET | Freq: Every day | ORAL | 0 refills | Status: DC

## 2017-01-15 NOTE — Progress Notes (Signed)
Jonathan Yu is a 66 y.o. male who presents to [facility]: [facility] today for follow-up hypertension testosterone and hyperlipidemia.  Hypertension: Patient notes his blood pressure typically is in the high 130s to 140s at home. He denies chest pain palpitations or shortness of breath and feels well otherwise. In the past he took Benicar and had some lightheadedness when it was combined with hydrochlorothiazide. He had a cough with Ace inhibitors. He no longer is taking any medications.  Testosterone: Patient is doing well with testosterone supplementation noting increased libido and energy.  Hyperlipidemia: Patient is resistant to taking statins. He does not follow a regular diet. He denies chest pain palpitations or shortness of breath.   Past Medical History:  Diagnosis Date  . Allergy   . DDD (degenerative disc disease), lumbar   . Hyperlipidemia    "no meds since losing 106# 2 yr ago" (10/06/2013)  . Hypertension 2012   "no meds since losing 106# 2 yr ago" (10/06/2013)  . OSA on CPAP    "don't wear mask much since losing 106# 2 yr ago" (10/06/2013)  . Type II diabetes mellitus ([place])    "no meds since losing 106# 2 yr ago" (10/06/2013)  . Walking pneumonia 1968   Past Surgical History:  Procedure Laterality Date  . ANKLE FRACTURE SURGERY Left 1973  . ANKLE RECONSTRUCTION Right 05/2011  . CARPAL TUNNEL RELEASE Bilateral 2001  . HARDWARE REMOVAL Right 10/07/2013   Procedure: RIGHT ANKLE HARDWARE REMOVAL;  Surgeon: Newt Minion, MD;  Location: [facility];  Service: Orthopedics;  Laterality: Right;  . I&D EXTREMITY Right 11/18/2013   Procedure: IRRIGATION AND DEBRIDEMENT EXTREMITY;  Surgeon: Newt Minion, MD;  Location: [facility];  Service: Orthopedics;  Laterality: Right;  Irrigation and Debridement Right Fibula , Place Antibiotic Beads and VAC  . NASAL SEPTUM SURGERY  1982  .  ORIF ANKLE FRACTURE Right 09/08/2013   Procedure: REPAIR SYNDESMOSIS DISRUPTION RIGHT ANKLE;  Surgeon: Newt Minion, MD;  Location: [facility];  Service: Orthopedics;  Laterality: Right;  . SHOULDER ARTHROSCOPY W/ ROTATOR CUFF REPAIR Right 2006; 2009  . TONSILLECTOMY  1959  . TOTAL ANKLE ARTHROPLASTY Right 08/18/2013   Procedure: TOTAL ANKLE ARTHOPLASTY;  Surgeon: Newt Minion, MD;  Location: [facility];  Service: Orthopedics;  Laterality: Right;  Right Total Ankle Arthroplasty, Revision Fibular Fracture  . URETERAL STENT PLACEMENT  ~ 2010 X 2   Social History  Substance Use Topics  . Smoking status: Current Some Day Smoker    Years: 6.00    Types: Pipe, Cigars  . Smokeless tobacco: Never Used     Comment: 10/06/2013 "use pipe or cigar 1-2 times/month"  . Alcohol use Yes     Comment: 10/06/2013 "have 4-5 drinks/yr"   family history includes Alzheimer's disease in his father; Cancer (age of onset: 55) in his mother; Cleft lip in his sister; Parkinson's disease in his father; Scoliosis in his sister.  ROS as above:  Medications: Current Outpatient Prescriptions  Medication Sig Dispense Refill  . cyclobenzaprine (FLEXERIL) 10 MG tablet Take 1 tablet (10 mg total) by mouth 3 (three) times daily as needed for muscle spasms. 90 tablet 1  . ibuprofen (ADVIL,MOTRIN) 200 MG tablet Take 200 mg by mouth every 6 (six) hours as needed.    . loratadine (CLARITIN) 10 MG tablet Take 10 mg by mouth daily.    . Misc. Devices (FLEX THERAPY) MISC by Does not apply  route.    . Multiple Vitamin (MULTIVITAMIN WITH MINERALS) TABS tablet Take 1 tablet by mouth daily.    . naproxen sodium (ALEVE) 220 MG tablet Take 220 mg by mouth 2 (two) times daily with a meal.    . Omega-3 Fatty Acids (FISH OIL PO) Take 1 capsule by mouth daily.    Marland Kitchen OVER THE COUNTER MEDICATION Take 1 tablet by mouth 4 (four) times daily - after meals and at bedtime. -pH balance formula Alkolete Yoli    . sildenafil (REVATIO) 20 MG tablet 2-5 tabs by  mouth in a 24 hour period as needed for intercourse 50 tablet 12  . testosterone cypionate (DEPOTESTOSTERONE CYPIONATE) 200 MG/ML injection Inject 1 mL (200 mg total) into the muscle every 21 ( twenty-one) days. 10 mL 0  . olmesartan (BENICAR) 20 MG tablet Take 1 tablet (20 mg total) by mouth daily. 90 tablet 0   No current facility-administered medications for this visit.    Allergies  Allergen Reactions  . Sesame Oil Hives and Shortness Of Breath  . Topamax [Topiramate]     Cognitive decline  . Percocet [Oxycodone-Acetaminophen] Rash  . Vicodin [Hydrocodone-Acetaminophen] Rash    Health Maintenance Health Maintenance  Topic Date Due  . HIV Screening  11/21/2017 (Originally 09/17/1965)  . INFLUENZA VACCINE  03/12/2017  . COLONOSCOPY  12/18/2019  . TETANUS/TDAP  04/24/2021  . Hepatitis C Screening  Completed     Exam:  BP (!) 176/80   Pulse (!) 55   Wt 272 lb (123.4 kg)   BMI 43.90 kg/m  Gen: Well NAD Obese HEENT: EOMI,  MMM Lungs: Normal work of breathing. CTABL Heart: RRR no MRG Abd: NABS, Soft. Nondistended, Nontender Exts: Brisk capillary refill, warm and well perfused.   Lab Results  Component Value Date   CHOL 166 01/07/2017   HDL 27 (L) 01/07/2017   LDLCALC 115 06/11/2016   TRIG 175 (H) 01/07/2017   CHOLHDL 6.1 (H) 01/07/2017     Chemistry      Component Value Date/Time   NA 140 01/07/2017 1149   K 4.0 01/07/2017 1149   CL 106 01/07/2017 1149   CO2 26 01/07/2017 1149   BUN 11 01/07/2017 1149   CREATININE 0.99 01/07/2017 1149      Component Value Date/Time   CALCIUM 8.8 01/07/2017 1149   ALKPHOS 39 (L) 01/07/2017 1149   AST 24 01/07/2017 1149   ALT 29 01/07/2017 1149   BILITOT 1.5 (H) 01/07/2017 1149     Lab Results  Component Value Date   TESTOSTERONE 355 01/07/2017      No results found for this or any previous visit (from the past 72 hour(s)). No results found.    Assessment and Plan: 66 y.o. male with  Hypertension not at goal  start taking Benicar daily. Recheck labs and blood pressure in about a month.  Hyperlipidemia: Also not at goal. Work on diet and exercise eventually probably will start a statin.  Testosterone: At goal. Continue current regimen.  We had a lengthy discussion about diet and exercise and obesity.   Orders Placed This Encounter  Procedures  . BASIC METABOLIC PANEL WITH GFR   Meds ordered this encounter  Medications  . testosterone cypionate (DEPOTESTOSTERONE CYPIONATE) injection 200 mg  . olmesartan (BENICAR) 20 MG tablet    Sig: Take 1 tablet (20 mg total) by mouth daily.    Dispense:  90 tablet    Refill:  0  . sildenafil (REVATIO) 20 MG tablet  Sig: 2-5 tabs by mouth in a 24 hour period as needed for intercourse    Dispense:  50 tablet    Refill:  12     Discussed warning signs or symptoms. Please see discharge instructions. Patient expresses understanding.  I spent 40 minutes with this patient, greater than 50% was face-to-face time counseling regarding the above diagnosis.

## 2017-01-15 NOTE — Patient Instructions (Addendum)
Thank you for coming in today. Continue testosterone. Start Benicar 1/2 or 1 tablet daily.  This medicine may work best if taken at night.  We need to check labs and blood pressure in 3-6 weeks.  We can do this at the next testosterone shot.   Otherwise we should recheck in 3 months.   Working on weight loss will help.  Try using a food logging system.  Measure your food.   I do recommend Aspirin daily.   We should do a shave biopsy of the mark on your chest.  Shedule a dedicated visit for that.   Olmesartan tablets What is this medicine? OLMESARTAN (all mi SAR tan) is used to treat high blood pressure. This medicine may be used for other purposes; ask your health care provider or pharmacist if you have questions. COMMON BRAND NAME(S): Benicar What should I tell my health care provider before I take this medicine? They need to know if you have any of these conditions: -if you are on a special diet, such as a low-salt diet -kidney or liver disease -an unusual or allergic reaction to olmesartan, other medicines, foods, dyes, or preservatives -pregnant or trying to get pregnant -breast-feeding How should I use this medicine? Take this medicine by mouth with a glass of water. Follow the directions on the prescription label. This medicine can be taken with or without food. Take your doses at regular intervals. Do not take your medicine more often than directed. Do not stop taking except on the advice of your doctor or health care professional. Talk to your pediatrician regarding the use of this medicine in children. While this drug may be prescribed for children as young as 6 years for selected conditions, precautions do apply. Overdosage: If you think you have taken too much of this medicine contact a poison control center or emergency room at once. NOTE: This medicine is only for you. Do not share this medicine with others. What if I miss a dose? If you miss a dose, take it as soon  as you can. If it is almost time for your next dose, take only that dose. Do not take double or extra doses. What may interact with this medicine? -blood pressure medicines -diuretics, especially triamterene, spironolactone or amiloride -potassium salts or potassium supplements This list may not describe all possible interactions. Give your health care provider a list of all the medicines, herbs, non-prescription drugs, or dietary supplements you use. Also tell them if you smoke, drink alcohol, or use illegal drugs. Some items may interact with your medicine. What should I watch for while using this medicine? Visit your doctor or health care professional for regular checks on your progress. Check your blood pressure as directed. Ask your doctor or health care professional what your blood pressure should be and when you should contact him or her. Call your doctor or health care professional if you notice an irregular or fast heart beat. Women should inform their doctor if they wish to become pregnant or think they might be pregnant. There is a potential for serious side effects to an unborn child, particularly in the second or third trimester. Talk to your health care professional or pharmacist for more information. You may get drowsy or dizzy. Do not drive, use machinery, or do anything that needs mental alertness until you know how this drug affects you. Do not stand or sit up quickly, especially if you are an older patient. This reduces the risk of dizzy or fainting  spells. Alcohol can make you more drowsy and dizzy. Avoid alcoholic drinks. Avoid salt substitutes unless you are told otherwise by your doctor or health care professional. Do not treat yourself for coughs, colds, or pain while you are taking this medicine without asking your doctor or health care professional for advice. Some ingredients may increase your blood pressure. What side effects may I notice from receiving this medicine? Side  effects that you should report to your doctor or health care professional as soon as possible: -confusion, dizziness, light headedness or fainting spells -decreased amount of urine passed -diarrhea -difficulty breathing or swallowing, hoarseness, or tightening of the throat -fast or irregular heart beat, palpitations, or chest pain -skin rash, itching -swelling of your face, lips, tongue, hands, or feet -vomiting -weight loss Side effects that usually do not require medical attention (report to your doctor or health care professional if they continue or are bothersome): -cough -decreased sexual function or desire -headache -nasal congestion or stuffiness -nausea -sore or cramping muscles This list may not describe all possible side effects. Call your doctor for medical advice about side effects. You may report side effects to FDA at 1-800-FDA-1088. Where should I keep my medicine? Keep out of the reach of children. Store your medicine at room temperature between 20 and 25 degrees C (68 and 77 degrees F). Throw away any unused medicine after the expiration date. NOTE: This sheet is a summary. It may not cover all possible information. If you have questions about this medicine, talk to your doctor, pharmacist, or health care provider.  2018 Elsevier/Gold Standard (2012-02-12 13:02:23)

## 2017-02-05 ENCOUNTER — Ambulatory Visit (INDEPENDENT_AMBULATORY_CARE_PROVIDER_SITE_OTHER): Payer: Medicare PPO | Admitting: Family Medicine

## 2017-02-05 ENCOUNTER — Encounter: Payer: Self-pay | Admitting: Family Medicine

## 2017-02-05 VITALS — BP 151/76 | HR 92 | Ht 66.0 in | Wt 273.0 lb

## 2017-02-05 DIAGNOSIS — M19019 Primary osteoarthritis, unspecified shoulder: Secondary | ICD-10-CM

## 2017-02-05 DIAGNOSIS — E291 Testicular hypofunction: Secondary | ICD-10-CM | POA: Diagnosis not present

## 2017-02-05 DIAGNOSIS — L989 Disorder of the skin and subcutaneous tissue, unspecified: Secondary | ICD-10-CM | POA: Diagnosis not present

## 2017-02-05 DIAGNOSIS — D045 Carcinoma in situ of skin of trunk: Secondary | ICD-10-CM | POA: Diagnosis not present

## 2017-02-05 DIAGNOSIS — I10 Essential (primary) hypertension: Secondary | ICD-10-CM | POA: Diagnosis not present

## 2017-02-05 MED ORDER — TESTOSTERONE CYPIONATE 200 MG/ML IM SOLN
200.0000 mg | INTRAMUSCULAR | Status: DC
Start: 2017-02-05 — End: 2017-04-16
  Administered 2017-02-05: 200 mg via INTRAMUSCULAR

## 2017-02-05 MED ORDER — TESTOSTERONE CYPIONATE 200 MG/ML IM SOLN: 200.0000 mg | INTRAMUSCULAR | Status: DC

## 2017-02-05 NOTE — Progress Notes (Signed)
Jonathan Yu is a 66 y.o. male who presents to [facility]: [facility] today for follow up HTN, skin lesion, and right acromioclavicular DJD.  Hypertension: Patient has a history of hypertension that has been trying to manage with diet and exercise. He has trouble keeping track of his diet or exercising regularly. He currently Takes olmesartan 20 mg daily. He notes his blood pressures at home have been reasonably well controlled. Home BP 132/82 and 129/79 and 141/74. He denies chest pain palpitations lightheadedness or shortness of breath.  Skin lesion: Patient has a small scaly around 1 cm reddish skin lesion on his chest wall. He's had previous treatment with liquid nitrogen and had return of the skin lesion. He denies night sweats weight loss or swollen lymph nodes.  Right acromioclavicular DJD. Patient has a history of several right shoulder surgeries and an arthritic right acromioclavicular joint. He's had aspiration injections in the past. He notes swelling and pain returning and is interested in injection today if possible.   Past Medical History:  Diagnosis Date  . Allergy   . DDD (degenerative disc disease), lumbar   . Hyperlipidemia    "no meds since losing 106# 2 yr ago" (10/06/2013)  . Hypertension 2012   "no meds since losing 106# 2 yr ago" (10/06/2013)  . OSA on CPAP    "don't wear mask much since losing 106# 2 yr ago" (10/06/2013)  . Type II diabetes mellitus ([place])    "no meds since losing 106# 2 yr ago" (10/06/2013)  . Walking pneumonia 1968   Past Surgical History:  Procedure Laterality Date  . ANKLE FRACTURE SURGERY Left 1973  . ANKLE RECONSTRUCTION Right 05/2011  . CARPAL TUNNEL RELEASE Bilateral 2001  . HARDWARE REMOVAL Right 10/07/2013   Procedure: RIGHT ANKLE HARDWARE REMOVAL;  Surgeon: Newt Minion, MD;  Location: [place] [facility];  Service: Orthopedics;   Laterality: Right;  . I&D EXTREMITY Right 11/18/2013   Procedure: IRRIGATION AND DEBRIDEMENT EXTREMITY;  Surgeon: Newt Minion, MD;  Location: [place];  Service: Orthopedics;  Laterality: Right;  Irrigation and Debridement Right Fibula , Place Antibiotic Beads and VAC  . NASAL SEPTUM SURGERY  1982  . ORIF ANKLE FRACTURE Right 09/08/2013   Procedure: REPAIR SYNDESMOSIS DISRUPTION RIGHT ANKLE;  Surgeon: Newt Minion, MD;  Location: [facility];  Service: Orthopedics;  Laterality: Right;  . SHOULDER ARTHROSCOPY W/ ROTATOR CUFF REPAIR Right 2006; 2009  . TONSILLECTOMY  1959  . TOTAL ANKLE ARTHROPLASTY Right 08/18/2013   Procedure: TOTAL ANKLE ARTHOPLASTY;  Surgeon: Newt Minion, MD;  Location: [place];  Service: Orthopedics;  Laterality: Right;  Right Total Ankle Arthroplasty, Revision Fibular Fracture  . URETERAL STENT PLACEMENT  ~ 2010 X 2   Social History  Substance Use Topics  . Smoking status: Current Some Day Smoker    Years: 6.00    Types: Pipe, Cigars  . Smokeless tobacco: Never Used     Comment: 10/06/2013 "use pipe or cigar 1-2 times/month"  . Alcohol use Yes     Comment: 10/06/2013 "have 4-5 drinks/yr"   family history includes Alzheimer's disease in his father; Cancer (age of onset: 59) in his mother; Cleft lip in his sister; Parkinson's disease in his father; Scoliosis in his sister.  ROS as above:  Medications: Current Outpatient Prescriptions  Medication Sig Dispense Refill  . cyclobenzaprine (FLEXERIL) 10 MG tablet Take 1 tablet (10 mg total) by mouth 3 (three) times daily as  needed for muscle spasms. 90 tablet 1  . ibuprofen (ADVIL,MOTRIN) 200 MG tablet Take 200 mg by mouth every 6 (six) hours as needed.    . loratadine (CLARITIN) 10 MG tablet Take 10 mg by mouth daily.    . Misc. Devices (FLEX THERAPY) MISC by Does not apply route.    . Multiple Vitamin (MULTIVITAMIN WITH MINERALS) TABS tablet Take 1 tablet by mouth daily.    . naproxen sodium (ALEVE) 220 MG tablet Take 220 mg by  mouth 2 (two) times daily with a meal.    . olmesartan (BENICAR) 20 MG tablet Take 1 tablet (20 mg total) by mouth daily. 90 tablet 0  . Omega-3 Fatty Acids (FISH OIL PO) Take 1 capsule by mouth daily.    Marland Kitchen OVER THE COUNTER MEDICATION Take 1 tablet by mouth 4 (four) times daily - after meals and at bedtime. -pH balance formula Alkolete Yoli    . sildenafil (REVATIO) 20 MG tablet 2-5 tabs by mouth in a 24 hour period as needed for intercourse 50 tablet 12  . testosterone cypionate (DEPOTESTOSTERONE CYPIONATE) 200 MG/ML injection Inject 1 mL (200 mg total) into the muscle every 21 ( twenty-one) days. 10 mL 0   Current Facility-Administered Medications  Medication Dose Route Frequency Provider Last Rate Last Dose  . testosterone cypionate (DEPOTESTOSTERONE CYPIONATE) injection 200 mg  200 mg Intramuscular Q14 Days Gregor Hams, MD   200 mg at 02/05/17 6160   Allergies  Allergen Reactions  . Sesame Oil Hives and Shortness Of Breath  . Topamax [Topiramate]     Cognitive decline  . Percocet [Oxycodone-Acetaminophen] Rash  . Vicodin [Hydrocodone-Acetaminophen] Rash    Health Maintenance Health Maintenance  Topic Date Due  . HIV Screening  11/21/2017 (Originally 09/17/1965)  . INFLUENZA VACCINE  03/12/2017  . COLONOSCOPY  12/18/2019  . TETANUS/TDAP  04/24/2021  . Hepatitis C Screening  Completed     Exam:  BP (!) 151/76   Pulse 92   Ht 5\' 6"  (1.676 m)   Wt 273 lb (123.8 kg)   BMI 44.06 kg/m   Wt Readings from Last 10 Encounters:  02/05/17 273 lb (123.8 kg)  01/15/17 272 lb (123.4 kg)  12/25/16 271 lb (122.9 kg)  11/13/16 268 lb (121.6 kg)  10/23/16 268 lb 11.2 oz (121.9 kg)  10/02/16 276 lb (125.2 kg)  09/12/16 282 lb (127.9 kg)  09/11/16 282 lb 0.6 oz (127.9 kg)  08/21/16 279 lb (126.6 kg)  07/31/16 276 lb (125.2 kg)    Gen: Well NAD HEENT: EOMI,  MMM Lungs: Normal work of breathing. CTABL Heart: RRR no MRG Abd: NABS, Soft. Nondistended, Nontender Exts: Brisk capillary  refill, warm and well perfused.  Skin: 1.2 cm flat scaly erythematous lesion central chest wall. MSK: Nontender mass overlying the right acromioclavicular joint.  Shave biopsy: Consent obtained timeout performed. Skin cleaned with alcohol. Cold spray applied. 1 ml of Marcaine with epinephrine injected achieving good anesthesia. Skin was again cleaned with alcohol. A deep shave biopsy was used to remove the skin lesion. A dressing was applied. Patient tolerated the procedure well.  Procedure: Real-time Ultrasound Guided Injection of right AC joint  Device: GE Logiq E  Images permanently stored and available for review in the ultrasound unit. Verbal informed consent obtained. Discussed risks and benefits of procedure. Warned about infection bleeding damage to structures skin hypopigmentation and fat atrophy among others. Patient expresses understanding and agreement Time-out conducted.  Noted no overlying erythema, induration, or other signs  of local infection.  Skin prepped in a sterile fashion.  Local anesthesia: Topical Ethyl chloride.  With sterile technique and under real time ultrasound guidance: 40mg  depomedrol and 3ml marcaine injected easily.  Completed without difficulty  Pain immediately resolved suggesting accurate placement of the medication.  Advised to call if fevers/chills, erythema, induration, drainage, or persistent bleeding.  Images permanently stored and available for review in the ultrasound unit.  Impression: Technically successful ultrasound guided injection.      Assessment and Plan: 66 y.o. male with  Hypertension: Not well controlled. Increase losartan to 40 mg and recheck in 3-4 weeks.  Skin lesion: Concerning for squamous cell cancer. Shave biopsy today. Pathology pending  Right acromioclavicular joint pain and swelling due to DJD. Injected today. Continue watchful waiting and follow-up.   No orders of the defined types were placed in  this encounter.  Meds ordered this encounter  Medications  . DISCONTD: testosterone cypionate (DEPOTESTOSTERONE CYPIONATE) injection 200 mg  . testosterone cypionate (DEPOTESTOSTERONE CYPIONATE) injection 200 mg     Discussed warning signs or symptoms. Please see discharge instructions. Patient expresses understanding.

## 2017-02-05 NOTE — Patient Instructions (Addendum)
Thank you for coming in today. I recommend increasing the Benicar to 40mg  (2 pills daily).  Send me blood pressure logs from home.  We will recheck labs in about 1 month.   As for the shave biopsy you can change the dressing tonight or this afternoon.  We should have results in 1 week or so.   As for the injection you should notice less swelling and pain in the next few days.   Call or go to the ER if you develop a large red swollen joint with extreme pain or oozing puss.   Recheck in about 1 month.    Skin Biopsy, Care After Refer to this sheet in the next few weeks. These instructions provide you with information about caring for yourself after your procedure. Your health care provider may also give you more specific instructions. Your treatment has been planned according to current medical practices, but problems sometimes occur. Call your health care provider if you have any problems or questions after your procedure. What can I expect after the procedure? After the procedure, it is common to have:  Soreness.  Bruising.  Itching.  Follow these instructions at home:  Rest and then return to your normal activities as told by your health care provider.  Take over-the-counter and prescription medicines only as told by your health care provider.  Follow instructions from your health care provider about how to take care of your biopsy site.Make sure you: ? Wash your hands with soap and water before you change your bandage (dressing). If soap and water are not available, use hand sanitizer. ? Change your dressing as told by your health care provider. ? Leave stitches (sutures), skin glue, or adhesive strips in place. These skin closures may need to stay in place for 2 weeks or longer. If adhesive strip edges start to loosen and curl up, you may trim the loose edges. Do not remove adhesive strips completely unless your health care provider tells you to do that. If the biopsy area  bleeds, apply gentle pressure for 10 minutes.  Check your biopsy site every day for signs of infection. Check for: ? More redness, swelling, or pain. ? More fluid or blood. ? Warmth. ? Pus or a bad smell.  Keep all follow-up visits as told by your health care provider. This is important. Contact a health care provider if:  You have more redness, swelling, or pain around your biopsy site.  You have more fluid or blood coming from your biopsy site.  Your biopsy site feels warm to the touch.  You have pus or a bad smell coming from your biopsy site.  You have a fever. Get help right away if:  You have bleeding that does not stop with pressure or a dressing. This information is not intended to replace advice given to you by your health care provider. Make sure you discuss any questions you have with your health care provider. Document Released: 08/25/2015 Document Revised: 03/24/2016 Document Reviewed: 10/26/2014 Elsevier Interactive Patient Education  [employer].

## 2017-02-26 ENCOUNTER — Encounter: Payer: Self-pay | Admitting: Family Medicine

## 2017-02-26 ENCOUNTER — Ambulatory Visit (INDEPENDENT_AMBULATORY_CARE_PROVIDER_SITE_OTHER): Payer: Medicare PPO | Admitting: Family Medicine

## 2017-02-26 VITALS — BP 148/85 | HR 64 | Ht 66.0 in | Wt 271.0 lb

## 2017-02-26 DIAGNOSIS — C4492 Squamous cell carcinoma of skin, unspecified: Secondary | ICD-10-CM

## 2017-02-26 DIAGNOSIS — E349 Endocrine disorder, unspecified: Secondary | ICD-10-CM

## 2017-02-26 DIAGNOSIS — L570 Actinic keratosis: Secondary | ICD-10-CM

## 2017-02-26 MED ORDER — TESTOSTERONE CYPIONATE 200 MG/ML IM SOLN
200.0000 mg | Freq: Once | INTRAMUSCULAR | Status: AC
  Administered 2017-02-26: 200 mg via INTRAMUSCULAR

## 2017-02-26 NOTE — Patient Instructions (Addendum)
Thank you for coming in today. Recheck as directed.  Keep the wounds clean.    Actinic Keratosis An actinic keratosis is a precancerous growth on the skin. This means that it could develop into skin cancer if it is not treated. About 1% of these growths (actinic keratoses) turn into skin cancer within one year if they are not treated. It is important to have all of these growths evaluated to determine the best treatment approach. What are the causes? This condition is caused by getting too much ultraviolet (UV) radiation from the sun or other UV light sources. What increases the risk? The following factors may make you more likely to develop this condition:  Having light-colored skin and blue eyes.  Having blonde or red hair.  Spending a lot of time in the sun.  Inadequate skin protection when outdoors. This may include: ? Not using sunscreen properly. ? Not covering up skin that is exposed to sunlight.  Aging. The risk of developing an actinic keratosis increases with age.  What are the signs or symptoms? Actinic keratoses look like scaly, rough spots of skin.They can be as small as a pinhead or as big as a quarter. They may itch, hurt, or feel sensitive. In most cases, the growths become red. In some cases, they may be skin-colored, light tan, dark tan, pink, or a combination of any of these colors. There may be a small piece of pink or gray skin (skin tag) growing from the actinic keratosis. In some cases, it may be easier to notice actinic keratoses by feeling them, rather than seeing them. Actinic keratoses appear most often on areas of skin that get a lot of sun exposure, including the scalp, face, ears, lips, upper back, forearms, and the backs of the hands. Sometimes, actinic keratoses disappear, but many reappear a few days to a few weeks later. How is this diagnosed? This condition is usually diagnosed with a physical exam. A tissue sample may be removed from the actinic  keratosis and examined under a microscope (biopsy). How is this treated?  Treatment for this condition may include:  Scraping off the actinic keratosis (curettage).  Freezing the actinic keratosis with liquid nitrogen (cryosurgery). This causes the growth to eventually fall off the skin.  Applying medicated creams or gels to destroy the cells in the growth.  Applying chemicals to the actinic keratosis to make the outer layers of skin peel off (chemical peel).  Photodynamic therapy. In this procedure, medicated cream is applied to the actinic keratosis. This cream increases your skin's sensitivity to light. Then, a strong light is aimed at the actinic keratosis to destroy cells in the growth.  Follow these instructions at home: Skin care  Apply cool, wet cloths (cool compresses) to the affected areas.  Do not scratch your skin.  Check your skin regularly for any growths, especially growths that: ? Start to itch or bleed. ? Change in size, shape, or color. Caring for the treated area  Keep the treated area clean and dry as told by your health care provider.  Do not apply any medicine, cream, or lotion to the treated area unless your health care provider tells you to do that.  Do not pick at blisters or try to break them open. This can cause infection and scarring.  If you have red or irritated skin after treatment, follow instructions from your health care provider about how to take care of the treated area. Make sure you: ? Wash your hands with  soap and water before you change your bandage (dressing). If soap and water are not available, use hand sanitizer. ? Change your dressing as told by your health care provider.  If you have red or irritated skin after treatment, check your treated area every day for signs of infection. Check for: ? Swelling, pain, or more redness. ? Fluid or blood. ? Warmth. ? Pus or a bad smell. General instructions  Take over-the-counter and  prescription medicines only as told by your health care provider.  Return to your normal activities as told by your health care provider. Ask your health care provider what activities are safe for you.  Do not use any tobacco products, such as cigarettes, chewing tobacco, and e-cigarettes. If you need help quitting, ask your health care provider.  Have a skin exam done every year by a health care provider who is a skin conditions specialist (dermatologist).  Keep all follow-up visits as told by your health care provider. This is important. How is this prevented?  Do not get sunburns.  Try to avoid the sun between 10:00 a.m. and 4:00 p.m. This is when the UV light is the strongest.  Use a sunscreen or sunblock with SPF 30 (sun protection factor 30) or greater.  Apply sunscreen before you are exposed to sunlight, and reapply periodically as often as directed by the instructions on the sunscreen container.  Always wear sunglasses that have UV protection, and always wear hats and clothing to protect your skin from sunlight.  When possible, avoid medicines that increase your sensitivity to sunlight. These include: ? Certain antibiotic medicines. ? Certain water pills (diuretics). ? Certain prescription medicines that are used to treat acne (retinoids).  Do not use tanning beds or other indoor tanning devices. Contact a health care provider if:  You notice any changes or new growths on your skin.  You have swelling, pain, or more redness around your treated area.  You have fluid or blood coming from your treated area.  Your treated area feels warm to the touch.  You have pus or a bad smell coming from your treated area.  You have a fever.  You have a blister that becomes large and painful. This information is not intended to replace advice given to you by your health care provider. Make sure you discuss any questions you have with your health care provider. Document Released:  10/25/2008 Document Revised: 03/29/2016 Document Reviewed: 04/08/2015 Elsevier Interactive Patient Education  [employer].

## 2017-02-26 NOTE — Progress Notes (Signed)
Jonathan Yu is a 66 y.o. male who presents to [facility]: [facility] today for follow-up skin lesion. Patient was seen recently and had a biopsy that diagnosed and treated squamous cell in situ on his chest. He has several other small scaly patches on his forearms that he would like addressed. He feels well with no fevers or chills.  He does note a small crack in the skin of his right heel. He notes this is pretty typical for him which typically is treated with a butterfly dressing.   Past Medical History:  Diagnosis Date  . Allergy   . DDD (degenerative disc disease), lumbar   . Hyperlipidemia    "no meds since losing 106# 2 yr ago" (10/06/2013)  . Hypertension 2012   "no meds since losing 106# 2 yr ago" (10/06/2013)  . OSA on CPAP    "don't wear mask much since losing 106# 2 yr ago" (10/06/2013)  . Type II diabetes mellitus ([place])    "no meds since losing 106# 2 yr ago" (10/06/2013)  . Walking pneumonia 1968   Past Surgical History:  Procedure Laterality Date  . ANKLE FRACTURE SURGERY Left 1973  . ANKLE RECONSTRUCTION Right 05/2011  . CARPAL TUNNEL RELEASE Bilateral 2001  . HARDWARE REMOVAL Right 10/07/2013   Procedure: RIGHT ANKLE HARDWARE REMOVAL;  Surgeon: Newt Minion, MD;  Location: [place];  Service: Orthopedics;  Laterality: Right;  . I&D EXTREMITY Right 11/18/2013   Procedure: IRRIGATION AND DEBRIDEMENT EXTREMITY;  Surgeon: Newt Minion, MD;  Location: [place];  Service: Orthopedics;  Laterality: Right;  Irrigation and Debridement Right Fibula , Place Antibiotic Beads and VAC  . NASAL SEPTUM SURGERY  1982  . ORIF ANKLE FRACTURE Right 09/08/2013   Procedure: REPAIR SYNDESMOSIS DISRUPTION RIGHT ANKLE;  Surgeon: Newt Minion, MD;  Location: [place];  Service: Orthopedics;  Laterality: Right;  . SHOULDER ARTHROSCOPY W/ ROTATOR CUFF REPAIR Right 2006; 2009  . TONSILLECTOMY   1959  . TOTAL ANKLE ARTHROPLASTY Right 08/18/2013   Procedure: TOTAL ANKLE ARTHOPLASTY;  Surgeon: Newt Minion, MD;  Location: [facility];  Service: Orthopedics;  Laterality: Right;  Right Total Ankle Arthroplasty, Revision Fibular Fracture  . URETERAL STENT PLACEMENT  ~ 2010 X 2   Social History  Substance Use Topics  . Smoking status: Current Some Day Smoker    Years: 6.00    Types: Pipe, Cigars  . Smokeless tobacco: Never Used     Comment: 10/06/2013 "use pipe or cigar 1-2 times/month"  . Alcohol use Yes     Comment: 10/06/2013 "have 4-5 drinks/yr"   family history includes Alzheimer's disease in his father; Cancer (age of onset: 53) in his mother; Cleft lip in his sister; Parkinson's disease in his father; Scoliosis in his sister.  ROS as above:  Medications: Current Outpatient Prescriptions  Medication Sig Dispense Refill  . cyclobenzaprine (FLEXERIL) 10 MG tablet Take 1 tablet (10 mg total) by mouth 3 (three) times daily as needed for muscle spasms. 90 tablet 1  . ibuprofen (ADVIL,MOTRIN) 200 MG tablet Take 200 mg by mouth every 6 (six) hours as needed.    . loratadine (CLARITIN) 10 MG tablet Take 10 mg by mouth daily.    . Misc. Devices (FLEX THERAPY) MISC by Does not apply route.    . Multiple Vitamin (MULTIVITAMIN WITH MINERALS) TABS tablet Take 1 tablet by mouth daily.    . naproxen sodium (ALEVE) 220 MG tablet  Take 220 mg by mouth 2 (two) times daily with a meal.    . olmesartan (BENICAR) 20 MG tablet Take 1 tablet (20 mg total) by mouth daily. 90 tablet 0  . Omega-3 Fatty Acids (FISH OIL PO) Take 1 capsule by mouth daily.    Marland Kitchen OVER THE COUNTER MEDICATION Take 1 tablet by mouth 4 (four) times daily - after meals and at bedtime. -pH balance formula Alkolete Yoli    . sildenafil (REVATIO) 20 MG tablet 2-5 tabs by mouth in a 24 hour period as needed for intercourse 50 tablet 12  . testosterone cypionate (DEPOTESTOSTERONE CYPIONATE) 200 MG/ML injection Inject 1 mL (200 mg total) into  the muscle every 21 ( twenty-one) days. 10 mL 0   Current Facility-Administered Medications  Medication Dose Route Frequency Provider Last Rate Last Dose  . testosterone cypionate (DEPOTESTOSTERONE CYPIONATE) injection 200 mg  200 mg Intramuscular Q14 Days Gregor Hams, MD   200 mg at 02/05/17 5300   Allergies  Allergen Reactions  . Sesame Oil Hives and Shortness Of Breath  . Topamax [Topiramate]     Cognitive decline  . Percocet [Oxycodone-Acetaminophen] Rash  . Vicodin [Hydrocodone-Acetaminophen] Rash    Health Maintenance Health Maintenance  Topic Date Due  . HIV Screening  11/21/2017 (Originally 09/17/1965)  . INFLUENZA VACCINE  03/12/2017  . COLONOSCOPY  12/18/2019  . TETANUS/TDAP  04/24/2021  . Hepatitis C Screening  Completed     Exam:  BP (!) 148/85   Pulse 64   Ht 5\' 6"  (1.676 m)   Wt 271 lb (122.9 kg)   BMI 43.74 kg/m  Gen: Well NAD HEENT: EOMI,  MMM Lungs: Normal work of breathing. CTABL Heart: RRR no MRG Abd: NABS, Soft. Nondistended, Nontender Exts: Brisk capillary refill, warm and well perfused.  Skin: Well-healing area central chest. Forearms bilaterally have scaly raised erythematous areas consistent in appearance with actinic keratoses. Right heel thickened cracked skin. No bleeding nontender no erythema.  Cryotherapy. 6 small actinic keratoses less than 1 cm areas bilateral forearms treated with cryosurgery 2  Cracked skin right heel treated with Steri-Strip per patient request  No results found for this or any previous visit (from the past 72 hour(s)). No results found.    Assessment and Plan: 66 y.o. male with  Squamous cell in situ successfully treated.  Actinic keratoses bilateral forearms treated with cryotherapy today     No orders of the defined types were placed in this encounter.  Meds ordered this encounter  Medications  . testosterone cypionate (DEPOTESTOSTERONE CYPIONATE) injection 200 mg     Discussed warning signs or  symptoms. Please see discharge instructions. Patient expresses understanding.

## 2017-03-12 ENCOUNTER — Ambulatory Visit (INDEPENDENT_AMBULATORY_CARE_PROVIDER_SITE_OTHER): Payer: Medicare PPO | Admitting: Orthopedic Surgery

## 2017-03-12 ENCOUNTER — Ambulatory Visit (INDEPENDENT_AMBULATORY_CARE_PROVIDER_SITE_OTHER): Payer: Medicare PPO

## 2017-03-12 ENCOUNTER — Encounter (INDEPENDENT_AMBULATORY_CARE_PROVIDER_SITE_OTHER): Payer: Self-pay | Admitting: Orthopedic Surgery

## 2017-03-12 VITALS — Ht 66.0 in | Wt 271.0 lb

## 2017-03-12 DIAGNOSIS — M25571 Pain in right ankle and joints of right foot: Secondary | ICD-10-CM

## 2017-03-12 DIAGNOSIS — Z96661 Presence of right artificial ankle joint: Secondary | ICD-10-CM | POA: Diagnosis not present

## 2017-03-12 NOTE — Progress Notes (Signed)
Office Visit Note   Patient: Jonathan Yu           Date of Birth: 25-Apr-1951           MRN: [id number] Visit Date: 03/12/2017              Requested by: Gregor Hams, [address] [place], [place] [id number] PCP: Gregor Hams, MD  Chief Complaint  Patient presents with  . Right Ankle - Follow-up    sp a right ankle hardware removal 10/07/13 and irrigation and debridement on 11/18/13        HPI: Patient is a 66 year old gentleman who is status post total ankle arthroplasty on the right. Patient states he has start up stiffness he states its after therapy he has excellent range of motion. Patient states he feels like he's been having increasing swelling. Patient is currently wearing flip-flops.  Assessment & Plan: Visit Diagnoses:  1. Pain in right ankle and joints of right foot   2. History of total ankle replacement, right     Plan: Recommended compression stockings for the venous insufficiency. Continue with his activities continue with scar massage.  Follow-Up Instructions: Return if symptoms worsen or fail to improve.   Ortho Exam  Patient is alert, oriented, no adenopathy, well-dressed, normal affect, normal respiratory effort. On examination patient is a slight antalgic gait both feet are externally rotated. He has good pulses the incisions are well healed. He has dorsiflexion of 20 plantar flexion of 20. There is no redness no cellulitis no tenderness to palpation he does have venous stasis swelling with brawny skin color changes. There is no instability with range of motion of the ankle there is no pain with range of motion or palpation.  Imaging: Xr Ankle Complete Right  Result Date: 03/12/2017 Three-view radiographs of the right ankle shows a congruent total ankle arthroplasty. There is no evidence of loosening. Patient does have some scar tissues in the medial and lateral router. 2 retained FiberWire buttons are in place.   Labs: Lab  Results  Component Value Date   HGBA1C 5.4 06/11/2016   HGBA1C 5.4 09/23/2014   HGBA1C 5.4 07/16/2012   ESRSEDRATE 4 03/09/2014   ESRSEDRATE 5 01/05/2014   ESRSEDRATE 12 11/20/2013   CRP <0.5 03/09/2014   CRP <0.5 01/05/2014   CRP 1.2 (H) 11/20/2013   REPTSTATUS 11/21/2013 FINAL 11/18/2013   REPTSTATUS 11/23/2013 FINAL 11/18/2013   GRAMSTAIN  11/18/2013    RARE WBC PRESENT,BOTH PMN AND MONONUCLEAR NO SQUAMOUS EPITHELIAL CELLS SEEN NO ORGANISMS SEEN Performed at [facility]  11/18/2013    RARE WBC PRESENT,BOTH PMN AND MONONUCLEAR NO SQUAMOUS EPITHELIAL CELLS SEEN NO ORGANISMS SEEN Performed at [facility]  11/18/2013    FEW STAPHYLOCOCCUS SPECIES (COAGULASE NEGATIVE) Performed at [facility]  11/18/2013    NO ANAEROBES ISOLATED Performed at [facility]    Orders:  Orders Placed This Encounter  Procedures  . XR Ankle Complete Right   No orders of the defined types were placed in this encounter.    Procedures: No procedures performed  Clinical Data: No additional findings.  ROS:  All other systems negative, except as noted in the HPI. Review of Systems  Objective: Vital Signs: Ht 5\' 6"  (1.676 m)   Wt 271 lb (122.9 kg)   BMI 43.74 kg/m   Specialty Comments:  No specialty comments available.  PMFS History: Patient Active  Problem List   Diagnosis Date Noted  . Unilateral primary osteoarthritis, left knee 09/12/2016  . Idiopathic chronic venous hypertension of both lower extremities with inflammation 09/12/2016  . Muscle spasm 03/07/2016  . Morbid obesity ([place]) 12/21/2013  . Osteomyelitis ([facility] [place]) 12/21/2013  . History of total ankle replacement, right 08/18/2013  . ED (erectile dysfunction) 09/10/2011  . BPH (benign prostatic hyperplasia) 09/10/2011  . Kidney stones 01/28/2011  . Testosterone deficiency 01/24/2011  . OSA on CPAP 01/24/2011  . Right lumbar radiculopathy 01/24/2011  .  Essential hypertension, benign 12/20/2010  . Dyslipidemia 12/20/2010   Past Medical History:  Diagnosis Date  . Allergy   . DDD (degenerative disc disease), lumbar   . Hyperlipidemia    "no meds since losing 106# 2 yr ago" (10/06/2013)  . Hypertension 2012   "no meds since losing 106# 2 yr ago" (10/06/2013)  . OSA on CPAP    "don't wear mask much since losing 106# 2 yr ago" (10/06/2013)  . Type II diabetes mellitus ([place])    "no meds since losing 106# 2 yr ago" (10/06/2013)  . Walking pneumonia 1968    Family History  Problem Relation Age of Onset  . Cancer Mother 12       Lung  . Alzheimer's disease Father   . Parkinson's disease Father   . Scoliosis Sister   . Cleft lip Sister     Past Surgical History:  Procedure Laterality Date  . ANKLE FRACTURE SURGERY Left 1973  . ANKLE RECONSTRUCTION Right 05/2011  . CARPAL TUNNEL RELEASE Bilateral 2001  . HARDWARE REMOVAL Right 10/07/2013   Procedure: RIGHT ANKLE HARDWARE REMOVAL;  Surgeon: Newt Minion, MD;  Location: [facility];  Service: Orthopedics;  Laterality: Right;  . I&D EXTREMITY Right 11/18/2013   Procedure: IRRIGATION AND DEBRIDEMENT EXTREMITY;  Surgeon: Newt Minion, MD;  Location: [facility];  Service: Orthopedics;  Laterality: Right;  Irrigation and Debridement Right Fibula , Place Antibiotic Beads and VAC  . NASAL SEPTUM SURGERY  1982  . ORIF ANKLE FRACTURE Right 09/08/2013   Procedure: REPAIR SYNDESMOSIS DISRUPTION RIGHT ANKLE;  Surgeon: Newt Minion, MD;  Location: [facility];  Service: Orthopedics;  Laterality: Right;  . SHOULDER ARTHROSCOPY W/ ROTATOR CUFF REPAIR Right 2006; 2009  . TONSILLECTOMY  1959  . TOTAL ANKLE ARTHROPLASTY Right 08/18/2013   Procedure: TOTAL ANKLE ARTHOPLASTY;  Surgeon: Newt Minion, MD;  Location: [facility];  Service: Orthopedics;  Laterality: Right;  Right Total Ankle Arthroplasty, Revision Fibular Fracture  . URETERAL STENT PLACEMENT  ~ 2010 X 2   Social History   Occupational History  . sales/ DJ     Social History Main Topics  . Smoking status: Current Some Day Smoker    Years: 6.00    Types: Pipe, Cigars  . Smokeless tobacco: Never Used     Comment: 10/06/2013 "use pipe or cigar 1-2 times/month"  . Alcohol use Yes     Comment: 10/06/2013 "have 4-5 drinks/yr"  . Drug use: No  . Sexual activity: Yes     Comment: 1 every 2 months

## 2017-03-19 ENCOUNTER — Ambulatory Visit (INDEPENDENT_AMBULATORY_CARE_PROVIDER_SITE_OTHER): Payer: Medicare PPO | Admitting: Family Medicine

## 2017-03-19 VITALS — BP 141/70 | HR 63 | Ht 66.0 in | Wt 273.0 lb

## 2017-03-19 DIAGNOSIS — E291 Testicular hypofunction: Secondary | ICD-10-CM

## 2017-03-19 MED ORDER — TESTOSTERONE CYPIONATE 200 MG/ML IM SOLN
200.0000 mg | INTRAMUSCULAR | Status: DC
  Administered 2017-03-19: 200 mg via INTRAMUSCULAR

## 2017-03-19 NOTE — Patient Instructions (Addendum)
Patient is here for testosterone injection. Denies chest pain, shortness of breath, headaches and problems with medication or mood changes. Patient did request refill on Benicar 20 mg. He was told to increase his medication to 2 tablets a day. Please advise if a new script will be sent for the 40 mg or to refill the 20 mg with directions to take 1 pill in the morning and 1 pill at night.  Patient tolerated injection well without complications. Patient advised to schedule next injection in 3 weeks. Kemar Pandit,CMA

## 2017-03-21 NOTE — Progress Notes (Signed)
Testosterone injection 

## 2017-03-26 ENCOUNTER — Other Ambulatory Visit: Payer: Self-pay | Admitting: Family Medicine

## 2017-03-26 MED ORDER — OLMESARTAN MEDOXOMIL 40 MG PO TABS: 40.0000 mg | ORAL_TABLET | Freq: Every day | ORAL | 1 refills | Status: DC

## 2017-03-26 NOTE — Telephone Encounter (Signed)
Done

## 2017-03-26 NOTE — Telephone Encounter (Signed)
Patient called a little upset because he told the nurse last week that he needed a refill of the new prescription of Benicar that Dr.Corey had increased sent over to [facility] located at [facility] in [place], [place] Patient states that he is now out of this med and Suzie Portela tells him they have not received anything.

## 2017-03-26 NOTE — Telephone Encounter (Signed)
Patient was seen last week at a nurse visit. In the office note CMA asked if an increase of the Benicar 20 mg could be sent to the pharmacy. Patient reported taking Benicar 20 mg 2 tablets daily and he was expecting Benicar 40 mg 1 tablet once daily to be sent to the pharmacy. In last office visit there is a mention of an increase. Please advise. Or sign new Rx for Benicar 40 mg once daily.

## 2017-04-09 ENCOUNTER — Ambulatory Visit: Payer: Medicare PPO

## 2017-04-11 ENCOUNTER — Ambulatory Visit: Payer: Medicare PPO

## 2017-04-16 ENCOUNTER — Ambulatory Visit (INDEPENDENT_AMBULATORY_CARE_PROVIDER_SITE_OTHER): Payer: Medicare PPO | Admitting: Family Medicine

## 2017-04-16 VITALS — BP 140/76 | HR 70 | Wt 275.0 lb

## 2017-04-16 DIAGNOSIS — Z23 Encounter for immunization: Secondary | ICD-10-CM

## 2017-04-16 DIAGNOSIS — E291 Testicular hypofunction: Secondary | ICD-10-CM

## 2017-04-16 MED ORDER — TESTOSTERONE CYPIONATE 200 MG/ML IM SOLN
200.0000 mg | INTRAMUSCULAR | Status: DC
  Administered 2017-04-16: 200 mg via INTRAMUSCULAR

## 2017-04-16 NOTE — Progress Notes (Signed)
Pt here for testosterone injection no SOB,CP or mood swings. Injection tolerated well given in [place]. Pt to RTC in 3 weeks for next injection.Jonathan Yu   Pt given flu shot while here today. He tolerated well.Jonathan Yu

## 2017-05-08 ENCOUNTER — Ambulatory Visit (INDEPENDENT_AMBULATORY_CARE_PROVIDER_SITE_OTHER): Payer: Medicare PPO | Admitting: Family Medicine

## 2017-05-08 VITALS — BP 145/72 | HR 61

## 2017-05-08 DIAGNOSIS — E291 Testicular hypofunction: Secondary | ICD-10-CM

## 2017-05-08 MED ORDER — TESTOSTERONE CYPIONATE 200 MG/ML IM SOLN
200.0000 mg | Freq: Once | INTRAMUSCULAR | Status: AC
  Administered 2017-05-08: 200 mg via INTRAMUSCULAR

## 2017-05-08 NOTE — Progress Notes (Signed)
Patient came into office today for testosterone injection. Denies chest pain, shortness of breath, headaches and problems associated with taking this medication. Patient states he has had no abnornal mood swings. Patient tolerated injection in [place] well without complications. Patient advised to schedule his next injection for 3 weeks from today.  Pt's BP was elevated, reports taking his medication daily. Denies any side effects. Per PCP, Pt to schedule next appt with PCP for eval. Appointment scheduled.

## 2017-05-29 ENCOUNTER — Ambulatory Visit (INDEPENDENT_AMBULATORY_CARE_PROVIDER_SITE_OTHER): Payer: Medicare PPO | Admitting: Family Medicine

## 2017-05-29 ENCOUNTER — Encounter: Payer: Self-pay | Admitting: Family Medicine

## 2017-05-29 VITALS — BP 126/79 | HR 66 | Wt 274.0 lb

## 2017-05-29 DIAGNOSIS — E349 Endocrine disorder, unspecified: Secondary | ICD-10-CM

## 2017-05-29 DIAGNOSIS — I87323 Chronic venous hypertension (idiopathic) with inflammation of bilateral lower extremity: Secondary | ICD-10-CM | POA: Diagnosis not present

## 2017-05-29 DIAGNOSIS — N400 Enlarged prostate without lower urinary tract symptoms: Secondary | ICD-10-CM

## 2017-05-29 DIAGNOSIS — L719 Rosacea, unspecified: Secondary | ICD-10-CM | POA: Diagnosis not present

## 2017-05-29 DIAGNOSIS — I10 Essential (primary) hypertension: Secondary | ICD-10-CM

## 2017-05-29 DIAGNOSIS — E291 Testicular hypofunction: Secondary | ICD-10-CM | POA: Diagnosis not present

## 2017-05-29 DIAGNOSIS — E785 Hyperlipidemia, unspecified: Secondary | ICD-10-CM

## 2017-05-29 MED ORDER — TESTOSTERONE CYPIONATE 200 MG/ML IM SOLN
200.0000 mg | Freq: Once | INTRAMUSCULAR | Status: AC
  Administered 2017-05-29: 200 mg via INTRAMUSCULAR

## 2017-05-29 MED ORDER — METRONIDAZOLE 0.75 % EX GEL
1.0000 | Freq: Two times a day (BID) | CUTANEOUS | 3 refills | Status: DC
Start: 2017-05-29 — End: 2017-06-09

## 2017-05-29 NOTE — Progress Notes (Signed)
Jonathan Yu is a 66 y.o. male who presents to [facility] today for hypertension, low testosterone, lipids, and rash.  Hypertension: Jonathan Yu takes Scientist, water quality daily. He tolerates the medication well with no chest pain palpitations or shortness of breath. He feels well otherwise.  Low testosterone: Jonathan Yu receives injectable testosterone every 3 weeks in the clinic. He notes the testosterone has improved his libido and energy. He feels well and notes that he tolerates it well. He is due for lab recheck in a few months.  Hyperlipidemia: Jonathan Yu has had elevated cholesterol the past. He is not currently on any management. He is due for recheck in about 3 months.  Rash: Jonathan Yu notes a rash on his face and malar pattern that is mildly erythematous and sometimes burns and tingles. He has done some research and is concerned that he may have rosacea.   Past Medical History:  Diagnosis Date  . Allergy   . DDD (degenerative disc disease), lumbar   . Hyperlipidemia    "no meds since losing 106# 2 yr ago" (10/06/2013)  . Hypertension 2012   "no meds since losing 106# 2 yr ago" (10/06/2013)  . OSA on CPAP    "don't wear mask much since losing 106# 2 yr ago" (10/06/2013)  . Type II diabetes mellitus ([facility])    "no meds since losing 106# 2 yr ago" (10/06/2013)  . Walking pneumonia 1968   Past Surgical History:  Procedure Laterality Date  . ANKLE FRACTURE SURGERY Left 1973  . ANKLE RECONSTRUCTION Right 05/2011  . CARPAL TUNNEL RELEASE Bilateral 2001  . HARDWARE REMOVAL Right 10/07/2013   Procedure: RIGHT ANKLE HARDWARE REMOVAL;  Surgeon: Newt Minion, MD;  Location: [facility];  Service: Orthopedics;  Laterality: Right;  . I&D EXTREMITY Right 11/18/2013   Procedure: IRRIGATION AND DEBRIDEMENT EXTREMITY;  Surgeon: Newt Minion, MD;  Location: [facility];  Service: Orthopedics;   Laterality: Right;  Irrigation and Debridement Right Fibula , Place Antibiotic Beads and VAC  . NASAL SEPTUM SURGERY  1982  . ORIF ANKLE FRACTURE Right 09/08/2013   Procedure: REPAIR SYNDESMOSIS DISRUPTION RIGHT ANKLE;  Surgeon: Newt Minion, MD;  Location: [facility];  Service: Orthopedics;  Laterality: Right;  . SHOULDER ARTHROSCOPY W/ ROTATOR CUFF REPAIR Right 2006; 2009  . TONSILLECTOMY  1959  . TOTAL ANKLE ARTHROPLASTY Right 08/18/2013   Procedure: TOTAL ANKLE ARTHOPLASTY;  Surgeon: Newt Minion, MD;  Location: [facility];  Service: Orthopedics;  Laterality: Right;  Right Total Ankle Arthroplasty, Revision Fibular Fracture  . URETERAL STENT PLACEMENT  ~ 2010 X 2   Social History  Substance Use Topics  . Smoking status: Current Some Day Smoker    Years: 6.00    Types: Pipe, Cigars  . Smokeless tobacco: Never Used     Comment: 10/06/2013 "use pipe or cigar 1-2 times/month"  . Alcohol use Yes     Comment: 10/06/2013 "have 4-5 drinks/yr"   family history includes Alzheimer's disease in his father; Cancer (age of onset: 23) in his mother; Cleft lip in his sister; Parkinson's disease in his father; Scoliosis in his sister.  ROS as above:  Medications: Current Outpatient Prescriptions  Medication Sig Dispense Refill  . ibuprofen (ADVIL,MOTRIN) 200 MG tablet Take 200 mg by mouth every 6 (six) hours as needed.    . loratadine (CLARITIN) 10 MG tablet Take 10 mg by mouth daily.    . Misc. Devices (FLEX THERAPY) MISC by Does  not apply route.    . Multiple Vitamin (MULTIVITAMIN WITH MINERALS) TABS tablet Take 1 tablet by mouth daily.    . naproxen sodium (ALEVE) 220 MG tablet Take 220 mg by mouth 2 (two) times daily with a meal.    . olmesartan (BENICAR) 40 MG tablet Take 1 tablet (40 mg total) by mouth daily. 90 tablet 1  . Omega-3 Fatty Acids (FISH OIL PO) Take 1 capsule by mouth daily.    Marland Kitchen OVER THE COUNTER MEDICATION Take 1 tablet by mouth 4 (four) times daily - after meals and at bedtime. -pH  balance formula Alkolete Yoli    . sildenafil (REVATIO) 20 MG tablet 2-5 tabs by mouth in a 24 hour period as needed for intercourse 50 tablet 12  . metroNIDAZOLE (METROGEL) 0.75 % gel Apply 1 application topically 2 (two) times daily. 45 g 3   Current Facility-Administered Medications  Medication Dose Route Frequency Provider Last Rate Last Dose  . testosterone cypionate (DEPOTESTOSTERONE CYPIONATE) injection 200 mg  200 mg Intramuscular Q21 days Gregor Hams, MD   200 mg at 04/16/17 1449   Allergies  Allergen Reactions  . Sesame Oil Hives and Shortness Of Breath  . Topamax [Topiramate]     Cognitive decline  . Percocet [Oxycodone-Acetaminophen] Rash  . Vicodin [Hydrocodone-Acetaminophen] Rash    Health Maintenance Health Maintenance  Topic Date Due  . HIV Screening  11/21/2017 (Originally 09/17/1965)  . COLONOSCOPY  12/18/2019  . TETANUS/TDAP  04/24/2021  . INFLUENZA VACCINE  Completed  . Hepatitis C Screening  Completed     Exam:  BP 126/79   Pulse 66   Wt 274 lb (124.3 kg)   BMI 44.22 kg/m  Gen: Well NAD HEENT: EOMI,  MMM Lungs: Normal work of breathing. CTABL Heart: RRR no MRG Abd: NABS, Soft. Nondistended, Nontender Exts: Brisk capillary refill, warm and well perfused.  Skin macular with occasional tiny papule malar pattern rash on face consistent with rosacea.   No results found for this or any previous visit (from the past 72 hour(s)). No results found.    Assessment and Plan: 66 y.o. male with  Hypertension: Blood pressure well controlled. Plan to continue current medications and recheck metabolic panel and about 2 months.  Low testosterone: Seems to be doing quite well with testosterone. Plan to continue current regimen and recheck testosterone as well as CBC and PSA in about 2 months.  Hyperlipidemia: Recheck fasting lipids at the same visit in about 2 months.  Obesity: Noted today. Plan to check A1c and TSH. Work on weight loss and decreased calorie  diet.  Rash: Rosacea. Empiric treatment with metronidazole gel.  Recheck 6 months.   Orders Placed This Encounter  Procedures  . CBC  . COMPLETE METABOLIC PANEL WITH GFR  . Lipid Panel w/reflex Direct LDL  . Testosterone  . Hemoglobin A1c  . Uric acid  . TSH  . PSA   Meds ordered this encounter  Medications  . testosterone cypionate (DEPOTESTOSTERONE CYPIONATE) injection 200 mg  . metroNIDAZOLE (METROGEL) 0.75 % gel    Sig: Apply 1 application topically 2 (two) times daily.    Dispense:  45 g    Refill:  3     Discussed warning signs or symptoms. Please see discharge instructions. Patient expresses understanding.

## 2017-05-29 NOTE — Patient Instructions (Signed)
Thank you for coming in today. Get labs in the near future fasting between testosterone injections.  Recheck in 6 months or sooner if needed.  Use metrogel for rosacea. If not better let me know we can use other treatments.    Rosacea Rosacea is a long-term (chronic) condition that affects the skin of the face, including the cheeks, nose, brow, and chin. This condition can also affect the eyes. Rosacea causes blood vessels near the surface of the skin to enlarge, which results in redness. What are the causes? The cause of this condition is not known. Certain triggers can make rosacea worse, including:  Hot baths.  Exercise.  Sunlight.  Very hot or cold temperatures.  Hot or spicy foods and drinks.  Drinking alcohol.  Stress.  Taking blood pressure medicine.  Long-term use of topical steroids on the face.  What increases the risk? This condition is more likely to develop in:  People who are older than 64 years of age.  Women.  People who have light-colored skin (light complexion).  People who have a family history of rosacea.  What are the signs or symptoms? Symptoms of this condition include:  Redness of the face.  Red bumps or pimples on the face.  A red, enlarged nose.  Blushing easily.  Red lines on the skin.  Irritated or burning feeling in the eyes.  Swollen eyelids.  How is this diagnosed? This condition is diagnosed with a medical history and physical exam. How is this treated? There is no cure for this condition, but treatment can help to control your symptoms. Your health care provider may recommend that you see a skin specialist (dermatologist). Treatment may include:  Antibiotic medicines that are applied to the skin or taken as a pill.  Laser treatment to improve the appearance of the skin.  Surgery. This is rare.  Your health care provider will also recommend the best way to take care of your skin. Even after your skin improves, you  will likely need to continue treatment to prevent your rosacea from coming back. Follow these instructions at home: Skin Care Take care of your skin as told by your health care provider. You may be told to do these things:  Wash your skin gently two or more times each day.  Use mild soap.  Use a sunscreen or sunblock with SPF 30 or greater.  Use gentle cosmetics that are meant for sensitive skin.  Shave with an electric shaver instead of a blade.  Lifestyle  Try to keep track of what foods trigger this condition. Avoid any triggers. These may include: ? Spicy foods. ? Seafood. ? Cheese. ? Hot liquids. ? Nuts. ? Chocolate. ? Iodized salt.  Do not drink alcohol.  Avoid extremely cold or hot temperatures.  Try to reduce your stress. If you need help, talk with your health care provider.  When you exercise, do these things to stay cool: ? Limit your sun exposure. ? Use a fan. ? Do shorter and more frequent intervals of exercise. General instructions  Keep all follow-up visits as told by your health care provider. This is important.  Take over-the-counter and prescription medicines only as told by your health care provider.  If your eyelids are affected, apply warm compresses to them. Do this as told by your health care provider.  If you were prescribed an antibiotic medicine, apply or take it as told by your health care provider. Do not stop using the antibiotic even if your condition  improves. Contact a health care provider if:  Your symptoms get worse.  Your symptoms do not improve after two months of treatment.  You have new symptoms.  You have any changes in vision or you have problems with your eyes, such as redness or itching.  You feel depressed.  You lose your appetite.  You have trouble concentrating. This information is not intended to replace advice given to you by your health care provider. Make sure you discuss any questions you have with your  health care provider. Document Released: 09/05/2004 Document Revised: 01/04/2016 Document Reviewed: 10/05/2014 Elsevier Interactive Patient Education  2018 [place].  Metronidazole skin gel, cream, or lotion What is this medicine? METRONIDAZOLE (me troe NI da zole) is an antiinfective. This medicine is used to treat rosacea, also known as adult acne. It reduces redness and inflammation and the number of pimples. This medicine may be used for other purposes; ask your health care provider or pharmacist if you have questions. COMMON BRAND NAME(S): MetroCream, MetroGel, MetroLotion, Noritate, Nydamax, Rosadan, Vitazol What should I tell my health care provider before I take this medicine? They need to know if you have any of these conditions: -anemia or other blood disorders -if you drink alcohol containing drinks -other chronic illness -an unusual or allergic reaction to metronidazole, or other medicines, foods, dyes, or preservatives -pregnant or trying to get pregnant -breast-feeding How should I use this medicine? This medicine is for external use only. Follow the directions on the prescription label. Wash hands before and after application. Wash the affected area with mild soap and water. Pat dry. Apply a small amount of this medicine to the affected area and rub gently. Do not get this medicine in your eyes. If you do, rinse out with plenty of cool tap water. Use this medicine at regular intervals. Do not use more often than directed. Finish the full course prescribed by your doctor or health care professional even if you think you are better. Do not stop using except on your doctor's advice. Talk to your pediatrician regarding the use of this medicine in children. Special care may be needed. Overdosage: If you think you have taken too much of this medicine contact a poison control center or emergency room at once. NOTE: This medicine is only for you. Do not share this medicine with  others. What if I miss a dose? If you miss a dose, use it as soon as you can. If it is almost time for your next dose, use only that dose. Do not use double or extra doses. What may interact with this medicine? -alcohol or any product that contains alcohol -warfarin This list may not describe all possible interactions. Give your health care provider a list of all the medicines, herbs, non-prescription drugs, or dietary supplements you use. Also tell them if you smoke, drink alcohol, or use illegal drugs. Some items may interact with your medicine. What should I watch for while using this medicine? Tell your doctor or health care professional if your skin does not improve in 3 weeks. Further healing can take up to 12 weeks. You can use makeup while using this medicine. Only use water based products and apply lightly. You may also be able to use sunscreens or moisturizers, ask your doctor or health care professional. Wait at least 5 minutes after applying this medicine before applying any of these additional products. What side effects may I notice from receiving this medicine? Side effects that you should report to  your doctor or health care professional as soon as possible: -if a new skin condition or rash develops or if rosacea gets worse -loss of feeling or tingling of treated area -nausea Side effects that usually do not require medical attention (report to your doctor or health care professional if they continue or are bothersome): -burning, itching, or redness -dry skin -watering eyes This list may not describe all possible side effects. Call your doctor for medical advice about side effects. You may report side effects to FDA at 1-800-FDA-1088. Where should I keep my medicine? Keep out of the reach of children. Store at room temperature between 20 and 25 degrees C (68 and 77 degrees F). Do not freeze. Throw away any unused medicine after the expiration date. NOTE: This sheet is a  summary. It may not cover all possible information. If you have questions about this medicine, talk to your doctor, pharmacist, or health care provider.  2018 Elsevier/Gold Standard (2015-08-30 19:47:22)

## 2017-06-02 ENCOUNTER — Telehealth: Payer: Self-pay | Admitting: Family Medicine

## 2017-06-02 NOTE — Telephone Encounter (Signed)
Dr. Georgina Snell,    [name] called and stated that the recent medication you prescribed to him is also 100.00 and was calling like you had asked to let you know.   [name]

## 2017-06-06 NOTE — Telephone Encounter (Signed)
Pt questions if PCP is going to send something different that he can afford. Will route.

## 2017-06-09 ENCOUNTER — Other Ambulatory Visit: Payer: Self-pay

## 2017-06-09 MED ORDER — METRONIDAZOLE 1 % EX GEL: Freq: Every day | CUTANEOUS | 0 refills | Status: DC

## 2017-06-09 NOTE — Telephone Encounter (Signed)
Pt advised.

## 2017-06-09 NOTE — Telephone Encounter (Signed)
I have changed the strength and I think it will be cheaper at [facility]. If it is not with a good Rx coupon the gel is $36 at [facility].   The other topical medicines are even more expensive ($400) per tube.   Let me know what he wants to do.

## 2017-06-19 ENCOUNTER — Ambulatory Visit: Payer: Medicare PPO

## 2017-06-20 ENCOUNTER — Ambulatory Visit (INDEPENDENT_AMBULATORY_CARE_PROVIDER_SITE_OTHER): Payer: Medicare PPO | Admitting: Sports Medicine

## 2017-06-20 VITALS — BP 143/85 | HR 66

## 2017-06-20 DIAGNOSIS — E291 Testicular hypofunction: Secondary | ICD-10-CM

## 2017-06-20 DIAGNOSIS — I10 Essential (primary) hypertension: Secondary | ICD-10-CM

## 2017-06-20 MED ORDER — METRONIDAZOLE 1 % EX GEL: Freq: Every day | CUTANEOUS | 12 refills | Status: DC

## 2017-06-20 MED ORDER — OLMESARTAN MEDOXOMIL-HCTZ 40-12.5 MG PO TABS: 1.0000 | ORAL_TABLET | Freq: Every day | ORAL | 3 refills | Status: DC

## 2017-06-20 MED ORDER — TESTOSTERONE CYPIONATE 200 MG/ML IM SOLN
200.0000 mg | Freq: Once | INTRAMUSCULAR | Status: AC
  Administered 2017-06-20: 200 mg via INTRAMUSCULAR

## 2017-06-20 NOTE — Progress Notes (Signed)
Patient came into office today for testosterone injection. Denies chest pain, shortness of breath, headaches and problems associated with taking this medication. Patient states he has had no abnornal mood swings. Patient tolerated injection in [place] well without complications. Patient advised to schedule his next injection for 3 weeks from today.

## 2017-06-20 NOTE — Addendum Note (Signed)
Addended by: Gregor Hams on: 06/20/2017 02:38 PM   Modules accepted: Orders

## 2017-06-20 NOTE — Telephone Encounter (Signed)
Pt states he went to [facility] to get Rx, and the GoodRx offer was not valid. Requesting new Rx. Will route.

## 2017-06-20 NOTE — Assessment & Plan Note (Signed)
Has been on only Benicar, switching to Benicar HCTZ, previous blood pressures over the past several years have been on and off elevated. Return in 2 weeks to repeat the blood pressure and likely for renal function.

## 2017-06-20 NOTE — Telephone Encounter (Signed)
I called Jonathan Yu.  Plan to send 1% metrogel to [facility] and pay cash prn

## 2017-07-01 DIAGNOSIS — E349 Endocrine disorder, unspecified: Secondary | ICD-10-CM | POA: Diagnosis not present

## 2017-07-01 DIAGNOSIS — E291 Testicular hypofunction: Secondary | ICD-10-CM | POA: Diagnosis not present

## 2017-07-01 DIAGNOSIS — I10 Essential (primary) hypertension: Secondary | ICD-10-CM | POA: Diagnosis not present

## 2017-07-01 DIAGNOSIS — I87323 Chronic venous hypertension (idiopathic) with inflammation of bilateral lower extremity: Secondary | ICD-10-CM | POA: Diagnosis not present

## 2017-07-01 DIAGNOSIS — E785 Hyperlipidemia, unspecified: Secondary | ICD-10-CM | POA: Diagnosis not present

## 2017-07-01 DIAGNOSIS — N400 Enlarged prostate without lower urinary tract symptoms: Secondary | ICD-10-CM | POA: Diagnosis not present

## 2017-07-02 ENCOUNTER — Encounter: Payer: Self-pay | Admitting: Family Medicine

## 2017-07-02 ENCOUNTER — Ambulatory Visit (INDEPENDENT_AMBULATORY_CARE_PROVIDER_SITE_OTHER): Payer: Medicare PPO | Admitting: Family Medicine

## 2017-07-02 ENCOUNTER — Other Ambulatory Visit: Payer: Self-pay | Admitting: Family Medicine

## 2017-07-02 VITALS — BP 138/65 | HR 71 | Temp 98.2°F | Resp 16 | Wt 274.1 lb

## 2017-07-02 DIAGNOSIS — I10 Essential (primary) hypertension: Secondary | ICD-10-CM | POA: Diagnosis not present

## 2017-07-02 DIAGNOSIS — K219 Gastro-esophageal reflux disease without esophagitis: Secondary | ICD-10-CM | POA: Insufficient documentation

## 2017-07-02 DIAGNOSIS — R062 Wheezing: Secondary | ICD-10-CM

## 2017-07-02 LAB — LIPID PANEL W/REFLEX DIRECT LDL
Cholesterol: 195 mg/dL (ref <200)
HDL: 31 mg/dL — ABNORMAL LOW (ref >40)
LDL Cholesterol (Calc): 124 mg/dL (calc) — ABNORMAL HIGH
Non-HDL Cholesterol (Calc): 164 mg/dL (calc) — ABNORMAL HIGH (ref <130)
Total CHOL/HDL Ratio: 6.3 (calc) — ABNORMAL HIGH (ref <5.0)
Triglycerides: 247 mg/dL — ABNORMAL HIGH (ref <150)

## 2017-07-02 LAB — COMPLETE METABOLIC PANEL WITH GFR
AG Ratio: 1.4 (calc) (ref 1.0–2.5)
ALT: 38 U/L (ref 9–46)
AST: 24 U/L (ref 10–35)
Albumin: 4 g/dL (ref 3.6–5.1)
Alkaline phosphatase (APISO): 42 U/L (ref 40–115)
BUN: 14 mg/dL (ref 7–25)
CO2: 31 mmol/L (ref 20–32)
Calcium: 9.7 mg/dL (ref 8.6–10.3)
Chloride: 101 mmol/L (ref 98–110)
Creat: 0.98 mg/dL (ref 0.70–1.25)
GFR, Est African American: 94 mL/min/{1.73_m2} (ref >=60)
GFR, Est Non African American: 81 mL/min/{1.73_m2} (ref >=60)
Globulin: 2.9 g/dL (calc) (ref 1.9–3.7)
Glucose, Bld: 128 mg/dL — ABNORMAL HIGH (ref 65–99)
Potassium: 4.3 mmol/L (ref 3.5–5.3)
Sodium: 138 mmol/L (ref 135–146)
Total Bilirubin: 2.2 mg/dL — ABNORMAL HIGH (ref 0.2–1.2)
Total Protein: 6.9 g/dL (ref 6.1–8.1)

## 2017-07-02 LAB — TESTOSTERONE: Testosterone: 593 ng/dL (ref 250–827)

## 2017-07-02 LAB — URIC ACID: Uric Acid, Serum: 7.2 mg/dL (ref 4.0–8.0)

## 2017-07-02 LAB — PSA: PSA: 1.8 ng/mL (ref <=4.0)

## 2017-07-02 LAB — HEMOGLOBIN A1C
Hgb A1c MFr Bld: 6.1 % of total Hgb — ABNORMAL HIGH (ref <5.7)
Mean Plasma Glucose: 128 (calc)
eAG (mmol/L): 7.1 (calc)

## 2017-07-02 LAB — CBC
HCT: 49.4 % (ref 38.5–50.0)
Hemoglobin: 16.6 g/dL (ref 13.2–17.1)
MCH: 29.8 pg (ref 27.0–33.0)
MCHC: 33.6 g/dL (ref 32.0–36.0)
MCV: 88.7 fL (ref 80.0–100.0)
MPV: 9.8 fL (ref 7.5–12.5)
Platelets: 266 10*3/uL (ref 140–400)
RBC: 5.57 10*6/uL (ref 4.20–5.80)
RDW: 12.1 % (ref 11.0–15.0)
WBC: 7.8 10*3/uL (ref 3.8–10.8)

## 2017-07-02 LAB — TSH: TSH: 2.31 mIU/L (ref 0.40–4.50)

## 2017-07-02 MED ORDER — OLMESARTAN MEDOXOMIL-HCTZ 40-12.5 MG PO TABS: 1.0000 | ORAL_TABLET | Freq: Every day | ORAL | 3 refills | Status: DC

## 2017-07-02 NOTE — Patient Instructions (Addendum)
Thank you for coming in today. Work on weight loss.  Continue Pepcid once of twice daily.  Let me know if you want to switch to a proton pump inhibitor.  Let me know if breathing gets worse.   Recheck in 6 months.  Return sooner if needed.   Gastroesophageal Reflux Scan A gastroesophageal reflux scan is a procedure that is used to check for gastroesophageal reflux, which is the backward flow of stomach contents into the tube that carries food from the mouth to the stomach (esophagus). The scan can also show if any stomach contents are inhaled (aspirated) into your lungs. You may need this scan if you have symptoms such as heartburn, vomiting, swallowing problems, or regurgitation. Regurgitation means that swallowed food is returning from the stomach to the esophagus. For this scan, you will drink a liquid that contains a small amount of a radioactive substance (tracer). A scanner with a camera that detects the radioactive tracer is used to see if any of the material backs up into your esophagus. Tell a health care provider about:  Any allergies you have.  All medicines you are taking, including vitamins, herbs, eye drops, creams, and over-the-counter medicines.  Any blood disorders you have.  Any surgeries you have had.  Any medical conditions you have.  If you are pregnant or you think that you may be pregnant.  If you are breastfeeding. What are the risks? Generally, this is a safe procedure. However, problems may occur, including:  Exposure to radiation (a small amount).  Allergic reaction to the radioactive substance. This is rare.  What happens before the procedure?  Ask your health care provider about changing or stopping your regular medicines. This is especially important if you are taking diabetes medicines or blood thinners.  Follow your health care provider's instructions about eating or drinking restrictions. What happens during the procedure?  You will be asked  to drink a liquid that contains a small amount of a radioactive tracer. This liquid will probably be similar to orange juice.  You will assume a position lying on your back.  A series of images will be taken of your esophagus and upper stomach.  You may be asked to move into different positions to help determine if reflux occurs more often when you are in specific positions.  For adults, an abdominal binder with an inflatable cuff may be placed on the belly (abdomen). This may be used to increase abdominal pressure. More images will be taken to see if the increased pressure causes reflux to occur. The procedure may vary among health care providers and hospitals. What happens after the procedure?  Return to your normal activities and your normal diet as directed by your health care provider.  The radioactive tracer will leave your body over the next few days. Drink enough fluid to keep your urine clear or pale yellow. This will help to flush the tracer out of your body.  It is your responsibility to obtain your test results. Ask your health care provider or the department performing the test when and how you will get your results. This information is not intended to replace advice given to you by your health care provider. Make sure you discuss any questions you have with your health care provider. Document Released: 09/19/2005 Document Revised: 04/22/2016 Document Reviewed: 05/10/2014 Elsevier Interactive Patient Education  [employer].

## 2017-07-02 NOTE — Progress Notes (Signed)
Jonathan Yu is a 66 y.o. male who presents to [facility] today for follow-up of hypertension.  Jonathan Yu was started on Benicar HCT a few weeks ago.  He tolerates medications well with no chest pain palpitations or shortness of breath.  Additionally he notes that he is experiencing worse acid reflux symptoms.  He takes Pepcid daily.  He would like to avoid increasing the Pepcid to PPI if possible.  He is reluctant to take chronic medications.  He is interested in lifestyle change to help improve his acid reflux.  He knows that his obesity contributes to his acid reflux.  Additionally over the last few weeks he notes some worsening wheezing.  He takes Claritin daily.  He denies chest pain palpitations or shortness of breath.  He was prescribed an albuterol inhaler previously but did not pick it up because of cost.   Past Medical History:  Diagnosis Date  . Allergy   . DDD (degenerative disc disease), lumbar   . Hyperlipidemia    "no meds since losing 106# 2 yr ago" (10/06/2013)  . Hypertension 2012   "no meds since losing 106# 2 yr ago" (10/06/2013)  . OSA on CPAP    "don't wear mask much since losing 106# 2 yr ago" (10/06/2013)  . Type II diabetes mellitus ([place])    "no meds since losing 106# 2 yr ago" (10/06/2013)  . Walking pneumonia 1968   Past Surgical History:  Procedure Laterality Date  . ANKLE FRACTURE SURGERY Left 1973  . ANKLE RECONSTRUCTION Right 05/2011  . CARPAL TUNNEL RELEASE Bilateral 2001  . HARDWARE REMOVAL Right 10/07/2013   Procedure: RIGHT ANKLE HARDWARE REMOVAL;  Surgeon: Newt Minion, MD;  Location: [facility];  Service: Orthopedics;  Laterality: Right;  . I&D EXTREMITY Right 11/18/2013   Procedure: IRRIGATION AND DEBRIDEMENT EXTREMITY;  Surgeon: Newt Minion, MD;  Location: [facility];  Service: Orthopedics;  Laterality: Right;  Irrigation and Debridement  Right Fibula , Place Antibiotic Beads and VAC  . NASAL SEPTUM SURGERY  1982  . ORIF ANKLE FRACTURE Right 09/08/2013   Procedure: REPAIR SYNDESMOSIS DISRUPTION RIGHT ANKLE;  Surgeon: Newt Minion, MD;  Location: [facility];  Service: Orthopedics;  Laterality: Right;  . SHOULDER ARTHROSCOPY W/ ROTATOR CUFF REPAIR Right 2006; 2009  . TONSILLECTOMY  1959  . TOTAL ANKLE ARTHROPLASTY Right 08/18/2013   Procedure: TOTAL ANKLE ARTHOPLASTY;  Surgeon: Newt Minion, MD;  Location: [facility];  Service: Orthopedics;  Laterality: Right;  Right Total Ankle Arthroplasty, Revision Fibular Fracture  . URETERAL STENT PLACEMENT  ~ 2010 X 2   Social History   Tobacco Use  . Smoking status: Current Some Day Smoker    Years: 6.00    Types: Pipe, Cigars  . Smokeless tobacco: Never Used  . Tobacco comment: 10/06/2013 "use pipe or cigar 1-2 times/month"  Substance Use Topics  . Alcohol use: Yes    Comment: 10/06/2013 "have 4-5 drinks/yr"   family history includes Alzheimer's disease in his father; Cancer (age of onset: 22) in his mother; Cleft lip in his sister; Parkinson's disease in his father; Scoliosis in his sister.  ROS as above:  Medications: Current Outpatient Medications  Medication Sig Dispense Refill  . ibuprofen (ADVIL,MOTRIN) 200 MG tablet Take 200 mg by mouth every 6 (six) hours as needed.    . loratadine (CLARITIN) 10 MG tablet Take 10 mg by mouth daily.    . metroNIDAZOLE (METROGEL) 1 %  gel Apply daily topically. 60 g 12  . Misc. Devices (FLEX THERAPY) MISC by Does not apply route.    . Multiple Vitamin (MULTIVITAMIN WITH MINERALS) TABS tablet Take 1 tablet by mouth daily.    . naproxen sodium (ALEVE) 220 MG tablet Take 220 mg by mouth 2 (two) times daily with a meal.    . olmesartan-hydrochlorothiazide (BENICAR HCT) 40-12.5 MG tablet Take 1 tablet by mouth daily. 90 tablet 3  . Omega-3 Fatty Acids (FISH OIL PO) Take 1 capsule by mouth daily.    Marland Kitchen OVER THE COUNTER MEDICATION Take 1 tablet by mouth 4  (four) times daily - after meals and at bedtime. -pH balance formula Alkolete Yoli    . sildenafil (REVATIO) 20 MG tablet 2-5 tabs by mouth in a 24 hour period as needed for intercourse 50 tablet 12   No current facility-administered medications for this visit.    Allergies  Allergen Reactions  . Sesame Oil Hives and Shortness Of Breath  . Topamax [Topiramate]     Cognitive decline  . Percocet [Oxycodone-Acetaminophen] Rash  . Vicodin [Hydrocodone-Acetaminophen] Rash    Health Maintenance Health Maintenance  Topic Date Due  . HIV Screening  11/21/2017 (Originally 09[birth year]/1967)  . COLONOSCOPY  12/18/2019  . TETANUS/TDAP  04/24/2021  . INFLUENZA VACCINE  Completed  . Hepatitis C Screening  Completed     Exam:  BP 138/65   Pulse 71   Temp 98.2 F (36.8 C) (Oral)   Resp 16   Wt 274 lb 1.9 oz (124.3 kg)   SpO2 97%   BMI 44.24 kg/m  Gen: Well NAD nontoxic appearing HEENT: EOMI,  MMM normal posterior pharynx Lungs: Normal work of breathing. CTABL Heart: RRR no MRG Abd: NABS, Soft. Nondistended, Nontender Exts: Brisk capillary refill, warm and well perfused.        Assessment and Plan: 66 y.o. male with  Hypertension: Blood pressure goal creatinine and potassium reviewed recently.  Continue Benicar HCT.  Acid reflux: Not well controlled.  Discussed lifestyle modification.  Patient will continue H2 blockers for now.  I think he probably do better with PPI although he is reluctant to take them.  He will let me know if he wants to start.  Recheck in 6 months.  Wheezing: Short-term intermittent wheezing is likely viral versus worsening.  Exam is benign today.  We discussed options.  Plan for continued antihistamines.  He is declined offered albuterol inhaler Singulair.  Will follow along and if worsening he will let me know.  Next step would probably be steroids.   No orders of the defined types were placed in this encounter.  Meds ordered this encounter  Medications  .  olmesartan-hydrochlorothiazide (BENICAR HCT) 40-12.5 MG tablet    Sig: Take 1 tablet by mouth daily.    Dispense:  90 tablet    Refill:  3     Discussed warning signs or symptoms. Please see discharge instructions. Patient expresses understanding.

## 2017-07-10 ENCOUNTER — Ambulatory Visit (INDEPENDENT_AMBULATORY_CARE_PROVIDER_SITE_OTHER): Payer: Medicare PPO | Admitting: Family Medicine

## 2017-07-10 VITALS — BP 131/73 | HR 71

## 2017-07-10 DIAGNOSIS — E349 Endocrine disorder, unspecified: Secondary | ICD-10-CM

## 2017-07-10 MED ORDER — TESTOSTERONE CYPIONATE 200 MG/ML IM SOLN
200.0000 mg | Freq: Once | INTRAMUSCULAR | Status: AC
  Administered 2017-07-10: 200 mg via INTRAMUSCULAR

## 2017-07-10 NOTE — Progress Notes (Signed)
   Subjective:    Patient ID: Jonathan Yu, male    DOB: 1951/07/26, 66 y.o.   MRN: [id number]  HPI  Berman Grainger is here for a testosterone injection. Denies chest pain, shortness of breath, headaches or mood changes.   Review of Systems     Objective:   Physical Exam        Assessment & Plan:  Hypogonadism - Patient tolerated injection well without complications. Patient advised to schedule next injection 21 days from today.

## 2017-07-31 ENCOUNTER — Encounter: Payer: Self-pay | Admitting: Family Medicine

## 2017-07-31 ENCOUNTER — Ambulatory Visit: Payer: Medicare PPO | Admitting: Family Medicine

## 2017-07-31 ENCOUNTER — Ambulatory Visit: Payer: Medicare PPO

## 2017-07-31 VITALS — BP 163/81 | HR 60 | Temp 97.8°F | Wt 282.0 lb

## 2017-07-31 DIAGNOSIS — E349 Endocrine disorder, unspecified: Secondary | ICD-10-CM

## 2017-07-31 DIAGNOSIS — J40 Bronchitis, not specified as acute or chronic: Secondary | ICD-10-CM

## 2017-07-31 MED ORDER — TESTOSTERONE CYPIONATE 200 MG/ML IM SOLN
200.0000 mg | INTRAMUSCULAR | Status: DC
  Administered 2017-07-31: 200 mg via INTRAMUSCULAR

## 2017-07-31 MED ORDER — PREDNISONE 10 MG PO TABS: 30.0000 mg | ORAL_TABLET | Freq: Every day | ORAL | 0 refills | Status: DC

## 2017-07-31 MED ORDER — ALBUTEROL SULFATE HFA 108 (90 BASE) MCG/ACT IN AERS: 2.0000 | INHALATION_SPRAY | Freq: Four times a day (QID) | RESPIRATORY_TRACT | 0 refills | Status: DC | PRN

## 2017-07-31 MED ORDER — GUAIFENESIN-CODEINE 100-10 MG/5ML PO SOLN
5.0000 mL | Freq: Every evening | ORAL | 0 refills | Status: DC | PRN
Start: 2017-07-31 — End: 2017-08-21

## 2017-07-31 MED ORDER — AZITHROMYCIN 250 MG PO TABS
250.0000 mg | ORAL_TABLET | Freq: Every day | ORAL | 0 refills | Status: DC
Start: 2017-07-31 — End: 2017-08-21

## 2017-07-31 NOTE — Patient Instructions (Signed)
Thank you for coming in today. Take prednisone and use albuterol for wheezing, cough and shortness of breath.  Use codeine as needed for cough.  Take azithromycin antibiotic if not better.   Call or go to the emergency room if you get worse, have trouble breathing, have chest pains, or palpitations.    Acute Bronchitis, Adult Acute bronchitis is when air tubes (bronchi) in the lungs suddenly get swollen. The condition can make it hard to breathe. It can also cause these symptoms:  A cough.  Coughing up clear, yellow, or green mucus.  Wheezing.  Chest congestion.  Shortness of breath.  A fever.  Body aches.  Chills.  A sore throat.  Follow these instructions at home: Medicines  Take over-the-counter and prescription medicines only as told by your doctor.  If you were prescribed an antibiotic medicine, take it as told by your doctor. Do not stop taking the antibiotic even if you start to feel better. General instructions  Rest.  Drink enough fluids to keep your pee (urine) clear or pale yellow.  Avoid smoking and secondhand smoke. If you smoke and you need help quitting, ask your doctor. Quitting will help your lungs heal faster.  Use an inhaler, cool mist vaporizer, or humidifier as told by your doctor.  Keep all follow-up visits as told by your doctor. This is important. How is this prevented? To lower your risk of getting this condition again:  Wash your hands often with soap and water. If you cannot use soap and water, use hand sanitizer.  Avoid contact with people who have cold symptoms.  Try not to touch your hands to your mouth, nose, or eyes.  Make sure to get the flu shot every year.  Contact a doctor if:  Your symptoms do not get better in 2 weeks. Get help right away if:  You cough up blood.  You have chest pain.  You have very bad shortness of breath.  You become dehydrated.  You faint (pass out) or keep feeling like you are going to  pass out.  You keep throwing up (vomiting).  You have a very bad headache.  Your fever or chills gets worse. This information is not intended to replace advice given to you by your health care provider. Make sure you discuss any questions you have with your health care provider. Document Released: 01/15/2008 Document Revised: 03/06/2016 Document Reviewed: 01/17/2016 Elsevier Interactive Patient Education  [employer].

## 2017-07-31 NOTE — Progress Notes (Signed)
Jonathan Yu is a 66 y.o. male who presents to [facility] today for cough congestion runny nose and wheezing and shortness of breath.  Symptoms present for about 5 days.  He is improving a bit but notes they are quite obnoxious.  The cough interferes with sleep.  He notes some wheezing and notes his symptoms are consistent with asthma that he had as a child.  He does not have asthma as an adult.   Past Medical History:  Diagnosis Date  . Allergy   . DDD (degenerative disc disease), lumbar   . Hyperlipidemia    "no meds since losing 106# 2 yr ago" (10/06/2013)  . Hypertension 2012   "no meds since losing 106# 2 yr ago" (10/06/2013)  . OSA on CPAP    "don't wear mask much since losing 106# 2 yr ago" (10/06/2013)  . Type II diabetes mellitus ([place])    "no meds since losing 106# 2 yr ago" (10/06/2013)  . Walking pneumonia 1968   Past Surgical History:  Procedure Laterality Date  . ANKLE FRACTURE SURGERY Left 1973  . ANKLE RECONSTRUCTION Right 05/2011  . CARPAL TUNNEL RELEASE Bilateral 2001  . HARDWARE REMOVAL Right 10/07/2013   Procedure: RIGHT ANKLE HARDWARE REMOVAL;  Surgeon: Newt Minion, MD;  Location: [facility];  Service: Orthopedics;  Laterality: Right;  . I&D EXTREMITY Right 11/18/2013   Procedure: IRRIGATION AND DEBRIDEMENT EXTREMITY;  Surgeon: Newt Minion, MD;  Location: [facility];  Service: Orthopedics;  Laterality: Right;  Irrigation and Debridement Right Fibula , Place Antibiotic Beads and VAC  . NASAL SEPTUM SURGERY  1982  . ORIF ANKLE FRACTURE Right 09/08/2013   Procedure: REPAIR SYNDESMOSIS DISRUPTION RIGHT ANKLE;  Surgeon: Newt Minion, MD;  Location: [facility];  Service: Orthopedics;  Laterality: Right;  . SHOULDER ARTHROSCOPY W/ ROTATOR CUFF REPAIR Right 2006; 2009  . TONSILLECTOMY  1959  . TOTAL ANKLE ARTHROPLASTY Right 08/18/2013   Procedure: TOTAL ANKLE  ARTHOPLASTY;  Surgeon: Newt Minion, MD;  Location: [facility];  Service: Orthopedics;  Laterality: Right;  Right Total Ankle Arthroplasty, Revision Fibular Fracture  . URETERAL STENT PLACEMENT  ~ 2010 X 2   Social History   Tobacco Use  . Smoking status: Current Some Day Smoker    Years: 6.00    Types: Pipe, Cigars  . Smokeless tobacco: Never Used  . Tobacco comment: 10/06/2013 "use pipe or cigar 1-2 times/month"  Substance Use Topics  . Alcohol use: Yes    Comment: 10/06/2013 "have 4-5 drinks/yr"   family history includes Alzheimer's disease in his father; Cancer (age of onset: 55) in his mother; Cleft lip in his sister; Parkinson's disease in his father; Scoliosis in his sister.  ROS as above:  Medications: Current Outpatient Medications  Medication Sig Dispense Refill  . ibuprofen (ADVIL,MOTRIN) 200 MG tablet Take 200 mg by mouth every 6 (six) hours as needed.    . loratadine (CLARITIN) 10 MG tablet Take 10 mg by mouth daily.    . metroNIDAZOLE (METROGEL) 1 % gel Apply daily topically. 60 g 12  . Misc. Devices (FLEX THERAPY) MISC by Does not apply route.    . Multiple Vitamin (MULTIVITAMIN WITH MINERALS) TABS tablet Take 1 tablet by mouth daily.    . naproxen sodium (ALEVE) 220 MG tablet Take 220 mg by mouth 2 (two) times daily with a meal.    . olmesartan-hydrochlorothiazide (BENICAR HCT) 40-12.5 MG tablet Take 1  tablet by mouth daily. 90 tablet 3  . Omega-3 Fatty Acids (FISH OIL PO) Take 1 capsule by mouth daily.    Marland Kitchen OVER THE COUNTER MEDICATION Take 1 tablet by mouth 4 (four) times daily - after meals and at bedtime. -pH balance formula Alkolete Yoli    . sildenafil (REVATIO) 20 MG tablet 2-5 tabs by mouth in a 24 hour period as needed for intercourse 50 tablet 12  . albuterol (PROVENTIL HFA;VENTOLIN HFA) 108 (90 Base) MCG/ACT inhaler Inhale 2 puffs into the lungs every 6 (six) hours as needed for wheezing or shortness of breath. 1 Inhaler 0  . azithromycin (ZITHROMAX) 250 MG tablet  Take 1 tablet (250 mg total) by mouth daily. Take first 2 tablets together, then 1 every day until finished. 6 tablet 0  . guaiFENesin-codeine 100-10 MG/5ML syrup Take 5 mLs by mouth at bedtime as needed for cough. 120 mL 0  . predniSONE (DELTASONE) 10 MG tablet Take 3 tablets (30 mg total) by mouth daily with breakfast. 15 tablet 0   Current Facility-Administered Medications  Medication Dose Route Frequency Provider Last Rate Last Dose  . testosterone cypionate (DEPOTESTOSTERONE CYPIONATE) injection 200 mg  200 mg Intramuscular Q21 days Gregor Hams, MD   200 mg at 07/31/17 1041   Allergies  Allergen Reactions  . Sesame Oil Hives and Shortness Of Breath  . Topamax [Topiramate]     Cognitive decline  . Percocet [Oxycodone-Acetaminophen] Rash  . Vicodin [Hydrocodone-Acetaminophen] Rash    Health Maintenance Health Maintenance  Topic Date Due  . HIV Screening  11/21/2017 (Originally 09/17/1967)  . COLONOSCOPY  12/18/2019  . TETANUS/TDAP  04/24/2021  . INFLUENZA VACCINE  Completed  . Hepatitis C Screening  Completed     Exam:  BP (!) 163/81   Pulse 60   Temp 97.8 F (36.6 C) (Oral)   Wt 282 lb (127.9 kg)   SpO2 96%   BMI 45.52 kg/m  Gen: Well NAD HEENT: EOMI,  MMM clear nasal discharge.  Posterior pharynx mildly erythematous.  Normal tympanic membranes bilaterally. Lungs: Normal work of breathing.  Wheezing present bilaterally. Heart: RRR no MRG Abd: NABS, Soft. Nondistended, Nontender Exts: Brisk capillary refill, warm and well perfused.    No results found for this or any previous visit (from the past 72 hour(s)). No results found.    Assessment and Plan: 66 y.o. male with cough and congestion with wheezing.  Symptoms likely due to bronchitis.  Possible some component of reactive airway disease.  Will treat with prednisone and codeine cough syrup as well as an albuterol inhaler.  Use azithromycin as a backup.  Testosterone injection today   No orders of the  defined types were placed in this encounter.  Meds ordered this encounter  Medications  . testosterone cypionate (DEPOTESTOSTERONE CYPIONATE) injection 200 mg  . predniSONE (DELTASONE) 10 MG tablet    Sig: Take 3 tablets (30 mg total) by mouth daily with breakfast.    Dispense:  15 tablet    Refill:  0  . azithromycin (ZITHROMAX) 250 MG tablet    Sig: Take 1 tablet (250 mg total) by mouth daily. Take first 2 tablets together, then 1 every day until finished.    Dispense:  6 tablet    Refill:  0  . guaiFENesin-codeine 100-10 MG/5ML syrup    Sig: Take 5 mLs by mouth at bedtime as needed for cough.    Dispense:  120 mL    Refill:  0  . albuterol (  PROVENTIL HFA;VENTOLIN HFA) 108 (90 Base) MCG/ACT inhaler    Sig: Inhale 2 puffs into the lungs every 6 (six) hours as needed for wheezing or shortness of breath.    Dispense:  1 Inhaler    Refill:  0    Generic equil ok     Discussed warning signs or symptoms. Please see discharge instructions. Patient expresses understanding.

## 2017-08-21 ENCOUNTER — Ambulatory Visit (INDEPENDENT_AMBULATORY_CARE_PROVIDER_SITE_OTHER): Payer: Medicare PPO | Admitting: Family Medicine

## 2017-08-21 VITALS — BP 122/74 | HR 64

## 2017-08-21 DIAGNOSIS — E349 Endocrine disorder, unspecified: Secondary | ICD-10-CM

## 2017-08-21 MED ORDER — TESTOSTERONE CYPIONATE 200 MG/ML IM SOLN
200.0000 mg | Freq: Once | INTRAMUSCULAR | Status: AC
  Administered 2017-08-21: 200 mg via INTRAMUSCULAR

## 2017-08-21 NOTE — Progress Notes (Signed)
Testosterone given, 200mg , LUOQ without complications. Just an FYI-pt is only taking a half tab of his bp medication now.

## 2017-09-10 ENCOUNTER — Ambulatory Visit (INDEPENDENT_AMBULATORY_CARE_PROVIDER_SITE_OTHER): Payer: Medicare PPO | Admitting: Physician Assistant

## 2017-09-10 ENCOUNTER — Ambulatory Visit (INDEPENDENT_AMBULATORY_CARE_PROVIDER_SITE_OTHER): Payer: Medicare PPO | Admitting: Orthopedic Surgery

## 2017-09-10 VITALS — BP 138/73 | HR 62 | Wt 264.0 lb

## 2017-09-10 DIAGNOSIS — E349 Endocrine disorder, unspecified: Secondary | ICD-10-CM

## 2017-09-10 MED ORDER — TESTOSTERONE CYPIONATE 200 MG/ML IM SOLN
200.0000 mg | Freq: Once | INTRAMUSCULAR | Status: AC
  Administered 2017-09-10: 200 mg via INTRAMUSCULAR

## 2017-09-10 NOTE — Progress Notes (Signed)
BP Readings from Last 3 Encounters:  09/10/17 138/73  08/21/17 122/74  07/31/17 (!) 163/81  Patient should be taking the full tablet of Benicar BP goal <130/80

## 2017-09-10 NOTE — Progress Notes (Addendum)
   Subjective:    Patient ID: Jonathan Yu, male    DOB: 02-01-1951, 67 y.o.   MRN: [id number]  HPI  Sohil Timko is here for a testosterone injection. Denies chest pain, shortness of breath, headaches or mood changes.   Hypertension - Patient has been losing weight. The weight loss has caused his blood pressure to drop. He has dizzy episodes. His home blood pressure readings have been around 104/62. He decided to cut his blood pressure medication in half.     Review of Systems     Objective:   Physical Exam Vitals:   09/10/17 1019  BP: 138/73  Pulse: 62  SpO2: 96%          Assessment & Plan:  Patient tolerated injection well without complications. Patient advised to schedule next injection 21 days from today.   Hypertension - Patient advised to continue to check blood pressure readings at home. Blood pressure is higher than 130/80.

## 2017-09-11 ENCOUNTER — Ambulatory Visit: Payer: Medicare PPO

## 2017-09-15 ENCOUNTER — Telehealth: Payer: Self-pay | Admitting: Family Medicine

## 2017-09-15 ENCOUNTER — Ambulatory Visit: Payer: Medicare PPO | Admitting: Osteopathic Medicine

## 2017-09-15 ENCOUNTER — Encounter: Payer: Self-pay | Admitting: Osteopathic Medicine

## 2017-09-15 VITALS — BP 135/72 | HR 80 | Temp 98.4°F | Wt 262.0 lb

## 2017-09-15 DIAGNOSIS — S8992XA Unspecified injury of left lower leg, initial encounter: Secondary | ICD-10-CM

## 2017-09-15 NOTE — Telephone Encounter (Signed)
Patient has requested that a prescription for Benicar be called in to his pharmacy (not [facility]). His pharmacy has not had [facility] in stock for quiet awhile.   Patient states that he feels that he only needs to take half a pill per day due to losing weight and naturally lowering his blood pressure. When he takes a whole pill, he becomes light-headed. Please advise. Thanks!

## 2017-09-15 NOTE — Progress Notes (Signed)
HPI: Jonathan Yu is a 67 y.o. male who  has a past medical history of Allergy, DDD (degenerative disc disease), lumbar, Hyperlipidemia, Hypertension (2012), OSA on CPAP, Type II diabetes mellitus ([place]), and Walking pneumonia (1968).  he presents to [facility] today, 09/15/17,  for chief complaint of: Blood blister on leg   Was doing some work in his bathroom few days ago and fell through the studs in the floor, abrasion on his abdomen, leg went through between the studs - bruised now evolving into small lood blisters - was adviesd to get checked out, not particularly painful. See photos below. Leg has been feeling weird in the back, but swelling has overall gone down significantly, knee is a bit sore but better than it was    Past medical, surgical, social and family history reviewed:  Patient Active Problem List   Diagnosis Date Noted  . GERD (gastroesophageal reflux disease) 07/02/2017  . Rosacea 05/29/2017  . Unilateral primary osteoarthritis, left knee 09/12/2016  . Idiopathic chronic venous hypertension of both lower extremities with inflammation 09/12/2016  . Muscle spasm 03/07/2016  . Morbid obesity ([place]) 12/21/2013  . Osteomyelitis ([place]) 12/21/2013  . History of total ankle replacement, right 08/18/2013  . ED (erectile dysfunction) 09/10/2011  . BPH (benign prostatic hyperplasia) 09/10/2011  . Kidney stones 01/28/2011  . Testosterone deficiency 01/24/2011  . OSA on CPAP 01/24/2011  . Right lumbar radiculopathy 01/24/2011  . Essential hypertension, benign 12/20/2010  . Dyslipidemia 12/20/2010    Past Surgical History:  Procedure Laterality Date  . ANKLE FRACTURE SURGERY Left 1973  . ANKLE RECONSTRUCTION Right 05/2011  . CARPAL TUNNEL RELEASE Bilateral 2001  . HARDWARE REMOVAL Right 10/07/2013   Procedure: RIGHT ANKLE HARDWARE REMOVAL;  Surgeon: Newt Minion, MD;  Location: [place];  Service: Orthopedics;  Laterality: Right;  . I&D  EXTREMITY Right 11/18/2013   Procedure: IRRIGATION AND DEBRIDEMENT EXTREMITY;  Surgeon: Newt Minion, MD;  Location: [place];  Service: Orthopedics;  Laterality: Right;  Irrigation and Debridement Right Fibula , Place Antibiotic Beads and VAC  . NASAL SEPTUM SURGERY  1982  . ORIF ANKLE FRACTURE Right 09/08/2013   Procedure: REPAIR SYNDESMOSIS DISRUPTION RIGHT ANKLE;  Surgeon: Newt Minion, MD;  Location: [place];  Service: Orthopedics;  Laterality: Right;  . SHOULDER ARTHROSCOPY W/ ROTATOR CUFF REPAIR Right 2006; 2009  . TONSILLECTOMY  1959  . TOTAL ANKLE ARTHROPLASTY Right 08/18/2013   Procedure: TOTAL ANKLE ARTHOPLASTY;  Surgeon: Newt Minion, MD;  Location: [place];  Service: Orthopedics;  Laterality: Right;  Right Total Ankle Arthroplasty, Revision Fibular Fracture  . URETERAL STENT PLACEMENT  ~ 2010 X 2    Social History   Tobacco Use  . Smoking status: Current Some Day Smoker    Years: 6.00    Types: Pipe, Cigars  . Smokeless tobacco: Never Used  . Tobacco comment: 10/06/2013 "use pipe or cigar 1-2 times/month"  Substance Use Topics  . Alcohol use: Yes    Comment: 10/06/2013 "have 4-5 drinks/yr"    Family History  Problem Relation Age of Onset  . Cancer Mother 58       Lung  . Alzheimer's disease Father   . Parkinson's disease Father   . Scoliosis Sister   . Cleft lip Sister      Current medication list and allergy/intolerance information reviewed:    Current Outpatient Medications  Medication Sig Dispense Refill  . albuterol (PROVENTIL HFA;VENTOLIN HFA) 108 (90 Base) MCG/ACT inhaler  Inhale 2 puffs into the lungs every 6 (six) hours as needed for wheezing or shortness of breath. 1 Inhaler 0  . ibuprofen (ADVIL,MOTRIN) 200 MG tablet Take 200 mg by mouth every 6 (six) hours as needed.    . loratadine (CLARITIN) 10 MG tablet Take 10 mg by mouth daily.    . metroNIDAZOLE (METROGEL) 1 % gel Apply daily topically. 60 g 12  . Misc. Devices (FLEX THERAPY) MISC by Does not apply route.     . Multiple Vitamin (MULTIVITAMIN WITH MINERALS) TABS tablet Take 1 tablet by mouth daily.    . naproxen sodium (ALEVE) 220 MG tablet Take 220 mg by mouth 2 (two) times daily with a meal.    . olmesartan-hydrochlorothiazide (BENICAR HCT) 40-12.5 MG tablet Take 1 tablet by mouth daily. (Patient taking differently: Take 0.5 tablets by mouth daily. ) 90 tablet 3  . Omega-3 Fatty Acids (FISH OIL PO) Take 1 capsule by mouth daily.    Marland Kitchen OVER THE COUNTER MEDICATION Take 1 tablet by mouth 4 (four) times daily - after meals and at bedtime. -pH balance formula Alkolete Yoli    . sildenafil (REVATIO) 20 MG tablet 2-5 tabs by mouth in a 24 hour period as needed for intercourse 50 tablet 12   Current Facility-Administered Medications  Medication Dose Route Frequency Provider Last Rate Last Dose  . testosterone cypionate (DEPOTESTOSTERONE CYPIONATE) injection 200 mg  200 mg Intramuscular Q21 days Gregor Hams, MD   200 mg at 07/31/17 1041    Allergies  Allergen Reactions  . Sesame Oil Hives and Shortness Of Breath  . Topamax [Topiramate]     Cognitive decline  . Percocet [Oxycodone-Acetaminophen] Rash  . Vicodin [Hydrocodone-Acetaminophen] Rash      Review of Systems:  Constitutional:  No  fever, no chills  HEENT: No  headache, no vision change  Cardiac: No  chest pain, No  pressure  Respiratory:  No  shortness of breath  Gastrointestinal: No  abdominal pain  Musculoskeletal: +myalgia/arthralgia  Skin: No  Rash, +other wounds/concerning lesions   Exam:  BP 135/72   Pulse 80   Temp 98.4 F (36.9 C) (Oral)   Wt 262 lb 0.6 oz (118.9 kg)   BMI 42.29 kg/m   Constitutional: VS see above. General Appearance: alert, well-developed, well-nourished, NAD  Eyes: Normal lids and conjunctive, non-icteric sclera  Neck: No masses, trachea midline.   Respiratory: Normal respiratory effort. no wheeze, no rhonchi, no rales  Cardiovascular: S1/S2 normal, no murmur, no rub/gallop auscultated.  RRR. No lower extremity edema.   Musculoskeletal: Gait normal. No clubbing/cyanosis of digits. Negative anterior/posterior drawer test, negative meniscal injury, negative medial/lateral ligamentous laxity. No effusion. Normal patellar glide.  Neurological: Normal balance/coordination. No tremor..   Skin: warm, dry, intact. Bruise ecchymoses as below - see photos  Psychiatric: Normal judgment/insight. Normal mood and affect. Oriented x3.    Then:    Now:

## 2017-09-16 ENCOUNTER — Telehealth: Payer: Self-pay | Admitting: Family Medicine

## 2017-09-16 MED ORDER — OLMESARTAN MEDOXOMIL 40 MG PO TABS: 40.0000 mg | ORAL_TABLET | Freq: Every day | ORAL | 1 refills | Status: DC

## 2017-09-16 MED ORDER — HYDROCHLOROTHIAZIDE 12.5 MG PO TABS: 12.5000 mg | ORAL_TABLET | Freq: Every day | ORAL | 1 refills | Status: DC

## 2017-09-16 NOTE — Telephone Encounter (Signed)
Combo olmesartan/HCTZ on backorder. Separate rx written

## 2017-09-16 NOTE — Telephone Encounter (Signed)
Dr. Georgina Snell please see noted and make changes as necessary. Rashaud Ybarbo,CMA

## 2017-09-16 NOTE — Telephone Encounter (Signed)
1/2 pill is fine

## 2017-09-17 NOTE — Telephone Encounter (Signed)
Patient has been advised. Babe Anthis,CMA  

## 2017-09-17 NOTE — Progress Notes (Signed)
Now:    A/P:  Left leg injury, initial encounter - Sterilize skin and use scalpel to lance blood blisters, bandage applied. Patient tolerated the procedure well. Declines x-ray. Follow-up as needed

## 2017-09-22 ENCOUNTER — Encounter (INDEPENDENT_AMBULATORY_CARE_PROVIDER_SITE_OTHER): Payer: Self-pay | Admitting: Orthopedic Surgery

## 2017-09-22 ENCOUNTER — Ambulatory Visit (INDEPENDENT_AMBULATORY_CARE_PROVIDER_SITE_OTHER): Payer: Medicare PPO | Admitting: Orthopedic Surgery

## 2017-09-22 ENCOUNTER — Ambulatory Visit (INDEPENDENT_AMBULATORY_CARE_PROVIDER_SITE_OTHER): Payer: Medicare PPO

## 2017-09-22 ENCOUNTER — Ambulatory Visit (INDEPENDENT_AMBULATORY_CARE_PROVIDER_SITE_OTHER): Payer: Self-pay

## 2017-09-22 DIAGNOSIS — L02416 Cutaneous abscess of left lower limb: Secondary | ICD-10-CM

## 2017-09-22 DIAGNOSIS — M79662 Pain in left lower leg: Secondary | ICD-10-CM | POA: Diagnosis not present

## 2017-09-22 DIAGNOSIS — M7989 Other specified soft tissue disorders: Secondary | ICD-10-CM

## 2017-09-22 DIAGNOSIS — L03116 Cellulitis of left lower limb: Secondary | ICD-10-CM | POA: Diagnosis not present

## 2017-09-22 DIAGNOSIS — M25512 Pain in left shoulder: Secondary | ICD-10-CM | POA: Diagnosis not present

## 2017-09-22 MED ORDER — METHYLPREDNISOLONE ACETATE 40 MG/ML IJ SUSP
40.0000 mg | INTRAMUSCULAR | Status: AC | PRN
  Administered 2017-09-22: 40 mg via INTRA_ARTICULAR

## 2017-09-22 MED ORDER — DOXYCYCLINE HYCLATE 100 MG PO TABS: 100.0000 mg | ORAL_TABLET | Freq: Two times a day (BID) | ORAL | 0 refills | Status: DC

## 2017-09-22 MED ORDER — LIDOCAINE HCL 1 % IJ SOLN
5.0000 mL | INTRAMUSCULAR | Status: AC | PRN
  Administered 2017-09-22: 5 mL

## 2017-09-22 NOTE — Progress Notes (Signed)
Office Visit Note   Patient: Jonathan Yu           Date of Birth: 05/12/1951           MRN: [id number] Visit Date: 09/22/2017              Requested by: Gregor Hams, [address] [place], [place] [address] PCP: Gregor Hams, MD  Chief Complaint  Patient presents with  . Left Leg - Pain, Injury  . Left Shoulder - Injury, Pain      HPI: Patient is a 67 year old gentleman who presents for evaluation for 3 separate issues.  Patient is status post a fall through the floor joist while remodeling his bathroom about 1-1/2 weeks ago.  Patient states he had some scrapes on the left leg he then developed some blood blisters sutures he states that these were lanced and patient presents complaining of cellulitis and pain in the posterior medial aspect of the left calf.  Patient states he also has had some shoulder pain with known rotator cuff pathology he states that after the fall on the left side the shoulder has been more painful.  Patient is also status post a right total ankle arthroplasty for which she states is asymptomatic.  Assessment & Plan: Visit Diagnoses:  1. Pain and swelling of left lower leg   2. Acute pain of left shoulder   3. Cellulitis and abscess of left leg     Plan: The left shoulder was injected he will continue with compression stockings for both lower extremities a prescription was called in for doxycycline for the cellulitis of the left leg follow-up Thursday if he is still symptomatic we would need to consider surgical debridement of the infected hematoma on Friday.  No intervention for his right total ankle arthroplasty.  Follow-Up Instructions: Return in about 1 week (around 09/29/2017).   Ortho Exam  Patient is alert, oriented, no adenopathy, well-dressed, normal affect, normal respiratory effort. Examination patient has cellulitis and induration over the medial aspect the left calf.  There is ecchymosis and bruising down to his ankle  secondary from the hematoma.  This is most likely due to his anti-inflammatories and omega-3 fish oil that he takes.  There is no purulent drainage no odor.  He has pain with Neer and Hawkins impingement test of the left shoulder.  He has good range of motion of the right ankle which is asymptomatic.  Imaging: Xr Tibia/fibula Left  Result Date: 09/22/2017 2 view radiographs of the left tibia shows no evidence of any fractures there are some phleboliths in the subcutaneous tissue.  No images are attached to the encounter.  Labs: Lab Results  Component Value Date   HGBA1C 6.1 (H) 07/01/2017   HGBA1C 5.4 06/11/2016   HGBA1C 5.4 09/23/2014   ESRSEDRATE 4 03/09/2014   ESRSEDRATE 5 01/05/2014   ESRSEDRATE 12 11/20/2013   CRP <0.5 03/09/2014   CRP <0.5 01/05/2014   CRP 1.2 (H) 11/20/2013   LABURIC 7.2 07/01/2017   REPTSTATUS 11/21/2013 FINAL 11/18/2013   REPTSTATUS 11/23/2013 FINAL 11/18/2013   GRAMSTAIN  11/18/2013    RARE WBC PRESENT,BOTH PMN AND MONONUCLEAR NO SQUAMOUS EPITHELIAL CELLS SEEN NO ORGANISMS SEEN Performed at [place]  11/18/2013    RARE WBC PRESENT,BOTH PMN AND MONONUCLEAR NO SQUAMOUS EPITHELIAL CELLS SEEN NO ORGANISMS SEEN Performed at [place]  11/18/2013    FEW STAPHYLOCOCCUS SPECIES (COAGULASE NEGATIVE) Performed at  [facility]   CULT  11/18/2013    NO ANAEROBES ISOLATED Performed at [facility]    @LABSALLVALUES (HGBA1)@  There is no height or weight on file to calculate BMI.  Orders:  Orders Placed This Encounter  Procedures  . XR Tibia/Fibula Left   Meds ordered this encounter  Medications  . doxycycline (VIBRA-TABS) 100 MG tablet    Sig: Take 1 tablet (100 mg total) by mouth 2 (two) times daily.    Dispense:  60 tablet    Refill:  0     Procedures: Large Joint Inj: R subacromial bursa on 09/22/2017 11:31 AM Indications: diagnostic evaluation and pain Details: 22 G 1.5 in needle,  posterior approach  Arthrogram: No  Medications: 5 mL lidocaine 1 %; 40 mg methylPREDNISolone acetate 40 MG/ML Outcome: tolerated well, no immediate complications Procedure, treatment alternatives, risks and benefits explained, specific risks discussed. Consent was given by the patient. Immediately prior to procedure a time out was called to verify the correct patient, procedure, equipment, support staff and site/side marked as required. Patient was prepped and draped in the usual sterile fashion.      Clinical Data: No additional findings.  ROS:  All other systems negative, except as noted in the HPI. Review of Systems  Objective: Vital Signs: There were no vitals taken for this visit.  Specialty Comments:  No specialty comments available.  PMFS History: Patient Active Problem List   Diagnosis Date Noted  . Pain and swelling of left lower leg 09/22/2017  . Acute pain of left shoulder 09/22/2017  . Cellulitis and abscess of left leg 09/22/2017  . GERD (gastroesophageal reflux disease) 07/02/2017  . Rosacea 05/29/2017  . Unilateral primary osteoarthritis, left knee 09/12/2016  . Idiopathic chronic venous hypertension of both lower extremities with inflammation 09/12/2016  . Muscle spasm 03/07/2016  . Morbid obesity ([place]) 12/21/2013  . Osteomyelitis ([place]) 12/21/2013  . History of total ankle replacement, right 08/18/2013  . ED (erectile dysfunction) 09/10/2011  . BPH (benign prostatic hyperplasia) 09/10/2011  . Kidney stones 01/28/2011  . Testosterone deficiency 01/24/2011  . OSA on CPAP 01/24/2011  . Right lumbar radiculopathy 01/24/2011  . Essential hypertension, benign 12/20/2010  . Dyslipidemia 12/20/2010   Past Medical History:  Diagnosis Date  . Allergy   . DDD (degenerative disc disease), lumbar   . Hyperlipidemia    "no meds since losing 106# 2 yr ago" (10/06/2013)  . Hypertension 2012   "no meds since losing 106# 2 yr ago" (10/06/2013)  . OSA on CPAP     "don't wear mask much since losing 106# 2 yr ago" (10/06/2013)  . Type II diabetes mellitus ([place])    "no meds since losing 106# 2 yr ago" (10/06/2013)  . Walking pneumonia 1968    Family History  Problem Relation Age of Onset  . Cancer Mother 20       Lung  . Alzheimer's disease Father   . Parkinson's disease Father   . Scoliosis Sister   . Cleft lip Sister     Past Surgical History:  Procedure Laterality Date  . ANKLE FRACTURE SURGERY Left 1973  . ANKLE RECONSTRUCTION Right 05/2011  . CARPAL TUNNEL RELEASE Bilateral 2001  . HARDWARE REMOVAL Right 10/07/2013   Procedure: RIGHT ANKLE HARDWARE REMOVAL;  Surgeon: Newt Minion, MD;  Location: [place];  Service: Orthopedics;  Laterality: Right;  . I&D EXTREMITY Right 11/18/2013   Procedure: IRRIGATION AND DEBRIDEMENT EXTREMITY;  Surgeon: Newt Minion, MD;  Location:  [place] [facility];  Service: Orthopedics;  Laterality: Right;  Irrigation and Debridement Right Fibula , Place Antibiotic Beads and VAC  . NASAL SEPTUM SURGERY  1982  . ORIF ANKLE FRACTURE Right 09/08/2013   Procedure: REPAIR SYNDESMOSIS DISRUPTION RIGHT ANKLE;  Surgeon: Newt Minion, MD;  Location: [place];  Service: Orthopedics;  Laterality: Right;  . SHOULDER ARTHROSCOPY W/ ROTATOR CUFF REPAIR Right 2006; 2009  . TONSILLECTOMY  1959  . TOTAL ANKLE ARTHROPLASTY Right 08/18/2013   Procedure: TOTAL ANKLE ARTHOPLASTY;  Surgeon: Newt Minion, MD;  Location: [place];  Service: Orthopedics;  Laterality: Right;  Right Total Ankle Arthroplasty, Revision Fibular Fracture  . URETERAL STENT PLACEMENT  ~ 2010 X 2   Social History   Occupational History  . Occupation: sales/ DJ  Tobacco Use  . Smoking status: Current Some Day Smoker    Years: 6.00    Types: Pipe, Cigars  . Smokeless tobacco: Never Used  . Tobacco comment: 10/06/2013 "use pipe or cigar 1-2 times/month"  Substance and Sexual Activity  . Alcohol use: Yes    Comment: 10/06/2013 "have 4-5 drinks/yr"  . Drug use: No  . Sexual activity:  Yes    Comment: 1 every 2 months

## 2017-09-25 ENCOUNTER — Telehealth: Payer: Self-pay | Admitting: Family Medicine

## 2017-09-25 ENCOUNTER — Encounter (INDEPENDENT_AMBULATORY_CARE_PROVIDER_SITE_OTHER): Payer: Self-pay | Admitting: Orthopedic Surgery

## 2017-09-25 ENCOUNTER — Ambulatory Visit (INDEPENDENT_AMBULATORY_CARE_PROVIDER_SITE_OTHER): Payer: Medicare PPO | Admitting: Orthopedic Surgery

## 2017-09-25 VITALS — Ht 66.0 in | Wt 262.0 lb

## 2017-09-25 DIAGNOSIS — M79662 Pain in left lower leg: Secondary | ICD-10-CM

## 2017-09-25 DIAGNOSIS — M7989 Other specified soft tissue disorders: Secondary | ICD-10-CM

## 2017-09-25 DIAGNOSIS — L02416 Cutaneous abscess of left lower limb: Secondary | ICD-10-CM | POA: Diagnosis not present

## 2017-09-25 DIAGNOSIS — L03116 Cellulitis of left lower limb: Secondary | ICD-10-CM | POA: Diagnosis not present

## 2017-09-25 MED ORDER — IRBESARTAN 300 MG PO TABS: 300.0000 mg | ORAL_TABLET | Freq: Every day | ORAL | 0 refills | Status: DC

## 2017-09-25 NOTE — Progress Notes (Signed)
Office Visit Note   Patient: Jonathan Yu           Date of Birth: [birth year]           MRN: [id number] Visit Date: 09/25/2017              Requested by: Gregor Hams, [address] [place], [place] [address] PCP: Gregor Hams, MD  Chief Complaint  Patient presents with  . Right Leg - Wound Check, Pain, Edema, Follow-up      HPI: Patient is a 67 year old gentleman who presents in follow-up for traumatic hematoma left leg.  Patient fell through the rafters of his house.  He is currently on doxycycline.  He feels like the swelling and infection is improving.  Assessment & Plan: Visit Diagnoses:  1. Pain and swelling of left lower leg   2. Cellulitis and abscess of left leg     Plan: Patient will get a new pair of compression stockings his current ones are extremely old and not providing enough compression discussed the importance of using the muscle pump in the calf to decrease swelling the importance of elevation.  Discussed that there is anything that changes in the ulceration drainage or increased redness to follow-up immediately.  Follow-Up Instructions: Return in about 1 week (around 10/02/2017).   Ortho Exam  Patient is alert, oriented, no adenopathy, well-dressed, normal affect, normal respiratory effort. Examination patient has ecchymosis and bruising down to his toes.  He has a good pulse he has pitting edema up to the tibial tubercle.  The redness and dermatitis is resolving the bruising skin abrasions are stable with no skin breakdown.  Imaging: No results found. No images are attached to the encounter.  Labs: Lab Results  Component Value Date   HGBA1C 6.1 (H) 07/01/2017   HGBA1C 5.4 06/11/2016   HGBA1C 5.4 09/23/2014   ESRSEDRATE 4 03/09/2014   ESRSEDRATE 5 01/05/2014   ESRSEDRATE 12 11/20/2013   CRP <0.5 03/09/2014   CRP <0.5 01/05/2014   CRP 1.2 (H) 11/20/2013   LABURIC 7.2 07/01/2017   REPTSTATUS 11/21/2013 FINAL 11/18/2013   REPTSTATUS 11/23/2013 FINAL 11/18/2013   GRAMSTAIN  11/18/2013    RARE WBC PRESENT,BOTH PMN AND MONONUCLEAR NO SQUAMOUS EPITHELIAL CELLS SEEN NO ORGANISMS SEEN Performed at [place]  11/18/2013    RARE WBC PRESENT,BOTH PMN AND MONONUCLEAR NO SQUAMOUS EPITHELIAL CELLS SEEN NO ORGANISMS SEEN Performed at [facility]  11/18/2013    FEW STAPHYLOCOCCUS SPECIES (COAGULASE NEGATIVE) Performed at [place]  11/18/2013    NO ANAEROBES ISOLATED Performed at [facility]    @LABSALLVALUES (HGBA1)@  Body mass index is 42.29 kg/m.  Orders:  No orders of the defined types were placed in this encounter.  No orders of the defined types were placed in this encounter.    Procedures: No procedures performed  Clinical Data: No additional findings.  ROS:  All other systems negative, except as noted in the HPI. Review of Systems  Objective: Vital Signs: Ht 5\' 6"  (1.676 m)   Wt 262 lb (118.8 kg)   BMI 42.29 kg/m   Specialty Comments:  No specialty comments available.  PMFS History: Patient Active Problem List   Diagnosis Date Noted  . Pain and swelling of left lower leg 09/22/2017  . Acute pain of left shoulder 09/22/2017  . Cellulitis and abscess of left leg 09/22/2017  . GERD (gastroesophageal reflux disease)  07/02/2017  . Rosacea 05/29/2017  . Unilateral primary osteoarthritis, left knee 09/12/2016  . Idiopathic chronic venous hypertension of both lower extremities with inflammation 09/12/2016  . Muscle spasm 03/07/2016  . Morbid obesity ([place]) 12/21/2013  . Osteomyelitis ([place]) 12/21/2013  . History of total ankle replacement, right 08/18/2013  . ED (erectile dysfunction) 09/10/2011  . BPH (benign prostatic hyperplasia) 09/10/2011  . Kidney stones 01/28/2011  . Testosterone deficiency 01/24/2011  . OSA on CPAP 01/24/2011  . Right lumbar radiculopathy 01/24/2011  . Essential hypertension, benign  12/20/2010  . Dyslipidemia 12/20/2010   Past Medical History:  Diagnosis Date  . Allergy   . DDD (degenerative disc disease), lumbar   . Hyperlipidemia    "no meds since losing 106# 2 yr ago" (10/06/2013)  . Hypertension 2012   "no meds since losing 106# 2 yr ago" (10/06/2013)  . OSA on CPAP    "don't wear mask much since losing 106# 2 yr ago" (10/06/2013)  . Type II diabetes mellitus ([place])    "no meds since losing 106# 2 yr ago" (10/06/2013)  . Walking pneumonia 1968    Family History  Problem Relation Age of Onset  . Cancer Mother 59       Lung  . Alzheimer's disease Father   . Parkinson's disease Father   . Scoliosis Sister   . Cleft lip Sister     Past Surgical History:  Procedure Laterality Date  . ANKLE FRACTURE SURGERY Left 1973  . ANKLE RECONSTRUCTION Right 05/2011  . CARPAL TUNNEL RELEASE Bilateral 2001  . HARDWARE REMOVAL Right 10/07/2013   Procedure: RIGHT ANKLE HARDWARE REMOVAL;  Surgeon: Newt Minion, MD;  Location: [place];  Service: Orthopedics;  Laterality: Right;  . I&D EXTREMITY Right 11/18/2013   Procedure: IRRIGATION AND DEBRIDEMENT EXTREMITY;  Surgeon: Newt Minion, MD;  Location: [place];  Service: Orthopedics;  Laterality: Right;  Irrigation and Debridement Right Fibula , Place Antibiotic Beads and VAC  . NASAL SEPTUM SURGERY  1982  . ORIF ANKLE FRACTURE Right 09/08/2013   Procedure: REPAIR SYNDESMOSIS DISRUPTION RIGHT ANKLE;  Surgeon: Newt Minion, MD;  Location: [place];  Service: Orthopedics;  Laterality: Right;  . SHOULDER ARTHROSCOPY W/ ROTATOR CUFF REPAIR Right 2006; 2009  . TONSILLECTOMY  1959  . TOTAL ANKLE ARTHROPLASTY Right 08/18/2013   Procedure: TOTAL ANKLE ARTHOPLASTY;  Surgeon: Newt Minion, MD;  Location: [place];  Service: Orthopedics;  Laterality: Right;  Right Total Ankle Arthroplasty, Revision Fibular Fracture  . URETERAL STENT PLACEMENT  ~ 2010 X 2   Social History   Occupational History  . Occupation: sales/ DJ  Tobacco Use  . Smoking  status: Current Some Day Smoker    Years: 6.00    Types: Pipe, Cigars  . Smokeless tobacco: Never Used  . Tobacco comment: 10/06/2013 "use pipe or cigar 1-2 times/month"  Substance and Sexual Activity  . Alcohol use: Yes    Comment: 10/06/2013 "have 4-5 drinks/yr"  . Drug use: No  . Sexual activity: Yes    Comment: 1 every 2 months

## 2017-09-25 NOTE — Telephone Encounter (Signed)
Olmesartan is on backorder. Will switch to ibersartan.

## 2017-10-02 ENCOUNTER — Encounter: Payer: Self-pay | Admitting: Family Medicine

## 2017-10-02 ENCOUNTER — Telehealth (INDEPENDENT_AMBULATORY_CARE_PROVIDER_SITE_OTHER): Payer: Self-pay | Admitting: Orthopedic Surgery

## 2017-10-02 ENCOUNTER — Ambulatory Visit (INDEPENDENT_AMBULATORY_CARE_PROVIDER_SITE_OTHER): Payer: Medicare PPO

## 2017-10-02 ENCOUNTER — Ambulatory Visit: Payer: Medicare PPO

## 2017-10-02 ENCOUNTER — Ambulatory Visit (INDEPENDENT_AMBULATORY_CARE_PROVIDER_SITE_OTHER): Payer: Medicare PPO | Admitting: Orthopedic Surgery

## 2017-10-02 ENCOUNTER — Encounter (INDEPENDENT_AMBULATORY_CARE_PROVIDER_SITE_OTHER): Payer: Self-pay | Admitting: Orthopedic Surgery

## 2017-10-02 ENCOUNTER — Ambulatory Visit: Payer: Medicare PPO | Admitting: Family Medicine

## 2017-10-02 VITALS — Ht 66.0 in | Wt 259.0 lb

## 2017-10-02 VITALS — BP 158/81 | HR 75 | Wt 259.0 lb

## 2017-10-02 DIAGNOSIS — I10 Essential (primary) hypertension: Secondary | ICD-10-CM

## 2017-10-02 DIAGNOSIS — G8929 Other chronic pain: Secondary | ICD-10-CM | POA: Insufficient documentation

## 2017-10-02 DIAGNOSIS — L03116 Cellulitis of left lower limb: Secondary | ICD-10-CM

## 2017-10-02 DIAGNOSIS — T792XXA Traumatic secondary and recurrent hemorrhage and seroma, initial encounter: Secondary | ICD-10-CM

## 2017-10-02 DIAGNOSIS — E349 Endocrine disorder, unspecified: Secondary | ICD-10-CM | POA: Diagnosis not present

## 2017-10-02 DIAGNOSIS — M25512 Pain in left shoulder: Secondary | ICD-10-CM

## 2017-10-02 DIAGNOSIS — L02416 Cutaneous abscess of left lower limb: Secondary | ICD-10-CM

## 2017-10-02 DIAGNOSIS — M75122 Complete rotator cuff tear or rupture of left shoulder, not specified as traumatic: Secondary | ICD-10-CM

## 2017-10-02 MED ORDER — TESTOSTERONE CYPIONATE 200 MG/ML IM SOLN
200.0000 mg | INTRAMUSCULAR | Status: DC
  Administered 2017-10-02: 200 mg via INTRAMUSCULAR

## 2017-10-02 MED ORDER — LOSARTAN POTASSIUM 50 MG PO TABS: 50.0000 mg | ORAL_TABLET | Freq: Every day | ORAL | 0 refills | Status: DC

## 2017-10-02 MED ORDER — DIAZEPAM 10 MG PO TABS: 10.0000 mg | ORAL_TABLET | Freq: Four times a day (QID) | ORAL | 0 refills | Status: DC | PRN

## 2017-10-02 NOTE — Progress Notes (Signed)
Jonathan Yu is a 67 y.o. male who presents to [facility] today for hypertension, left shoulder pain, left calf seroma.  Hypertension: Jonathan Yu previously was taking Benicar HCTZ.  This medicine became unavailable due to recall.  He has been out of medicines for some time now.  He notes that until he injured his left leg recently his blood pressure was well controlled at home with no chest pains palpitations lightheadedness or dizziness.  His blood pressure typically was in the 160 systolic.  He notes that since he injured his leg his blood pressure has been a bit higher into the 140s or 150s.  He notes that when he was on his full dose of Benicar HCTZ he experienced lightheadedness and dizziness.  Left leg wound: Jonathan Yu was seen several times for significant contusion to the left leg on February 4.  He ultimately followed up with Dr. Sharol Given at  [facility].  At that time he was thought to have cellulitis on top of hematoma.  He was empirically treated with doxycycline.  Additionally his calf hematoma was treated with compression.  He notes on doxycycline with compression the pain has improved as has the swelling.  He continues however to experience moderate pain and swelling at the left calf.  He notes that he has a follow-up appointment with his orthopedic surgeon today.  Additionally Jonathan Yu notes continued pain in his left shoulder.  He has had ongoing chronic pain thought to be due to rotator cuff tendinopathy for some time now.  The pain worsened after he fell about 2 or 3 weeks ago.  He notes since he fell he has had worsening pain and weakness with overhead motion.  At this point he is already had a trial of steroid injection recently and is thinking maybe it is time for MRI or surgery.  As noted above he has a follow-up appointment today with his orthopedic  surgeon..   Past Medical History:  Diagnosis Date  . Allergy   . DDD (degenerative disc disease), lumbar   . Hyperlipidemia    "no meds since losing 106# 2 yr ago" (10/06/2013)  . Hypertension 2012   "no meds since losing 106# 2 yr ago" (10/06/2013)  . OSA on CPAP    "don't wear mask much since losing 106# 2 yr ago" (10/06/2013)  . Type II diabetes mellitus ([place])    "no meds since losing 106# 2 yr ago" (10/06/2013)  . Walking pneumonia 1968   Past Surgical History:  Procedure Laterality Date  . ANKLE FRACTURE SURGERY Left 1973  . ANKLE RECONSTRUCTION Right 05/2011  . CARPAL TUNNEL RELEASE Bilateral 2001  . HARDWARE REMOVAL Right 10/07/2013   Procedure: RIGHT ANKLE HARDWARE REMOVAL;  Surgeon: Newt Minion, MD;  Location: [facility];  Service: Orthopedics;  Laterality: Right;  . I&D EXTREMITY Right 11/18/2013   Procedure: IRRIGATION AND DEBRIDEMENT EXTREMITY;  Surgeon: Newt Minion, MD;  Location: [facility];  Service: Orthopedics;  Laterality: Right;  Irrigation and Debridement Right Fibula , Place Antibiotic Beads and VAC  . NASAL SEPTUM SURGERY  1982  . ORIF ANKLE FRACTURE Right 09/08/2013   Procedure: REPAIR SYNDESMOSIS DISRUPTION RIGHT ANKLE;  Surgeon: Newt Minion, MD;  Location: [facility];  Service: Orthopedics;  Laterality: Right;  . SHOULDER ARTHROSCOPY W/ ROTATOR CUFF REPAIR Right 2006; 2009  . TONSILLECTOMY  1959  . TOTAL ANKLE ARTHROPLASTY Right 08/18/2013   Procedure: TOTAL ANKLE ARTHOPLASTY;  Surgeon:  Newt Minion, MD;  Location: [facility];  Service: Orthopedics;  Laterality: Right;  Right Total Ankle Arthroplasty, Revision Fibular Fracture  . URETERAL STENT PLACEMENT  ~ 2010 X 2   Social History   Tobacco Use  . Smoking status: Current Some Day Smoker    Years: 6.00    Types: Pipe, Cigars  . Smokeless tobacco: Never Used  . Tobacco comment: 10/06/2013 "use pipe or cigar 1-2 times/month"  Substance Use Topics  . Alcohol use: Yes    Comment: 10/06/2013 "have 4-5 drinks/yr"   family  history includes Alzheimer's disease in his father; Cancer (age of onset: 37) in his mother; Cleft lip in his sister; Parkinson's disease in his father; Scoliosis in his sister.  ROS as above:  Medications: Current Outpatient Medications  Medication Sig Dispense Refill  . albuterol (PROVENTIL HFA;VENTOLIN HFA) 108 (90 Base) MCG/ACT inhaler Inhale 2 puffs into the lungs every 6 (six) hours as needed for wheezing or shortness of breath. 1 Inhaler 0  . hydrochlorothiazide (HYDRODIURIL) 12.5 MG tablet Take 1 tablet (12.5 mg total) by mouth daily. 90 tablet 1  . ibuprofen (ADVIL,MOTRIN) 200 MG tablet Take 200 mg by mouth every 6 (six) hours as needed.    . irbesartan (AVAPRO) 300 MG tablet Take 1 tablet (300 mg total) by mouth daily. 90 tablet 0  . loratadine (CLARITIN) 10 MG tablet Take 10 mg by mouth daily.    . metroNIDAZOLE (METROGEL) 1 % gel Apply daily topically. 60 g 12  . Misc. Devices (FLEX THERAPY) MISC by Does not apply route.    . Multiple Vitamin (MULTIVITAMIN WITH MINERALS) TABS tablet Take 1 tablet by mouth daily.    . naproxen sodium (ALEVE) 220 MG tablet Take 220 mg by mouth 2 (two) times daily with a meal.    . Omega-3 Fatty Acids (FISH OIL PO) Take 1 capsule by mouth daily.    Marland Kitchen OVER THE COUNTER MEDICATION Take 1 tablet by mouth 4 (four) times daily - after meals and at bedtime. -pH balance formula Alkolete Yoli    . sildenafil (REVATIO) 20 MG tablet 2-5 tabs by mouth in a 24 hour period as needed for intercourse 50 tablet 12   Current Facility-Administered Medications  Medication Dose Route Frequency Provider Last Rate Last Dose  . testosterone cypionate (DEPOTESTOSTERONE CYPIONATE) injection 200 mg  200 mg Intramuscular Q21 days Gregor Hams, MD   200 mg at 07/31/17 1041  . testosterone cypionate (DEPOTESTOSTERONE CYPIONATE) injection 200 mg  200 mg Intramuscular Q21 days Gregor Hams, MD   200 mg at 10/02/17 1049   Allergies  Allergen Reactions  . Sesame Oil Hives and  Shortness Of Breath  . Topamax [Topiramate]     Cognitive decline  . Percocet [Oxycodone-Acetaminophen] Rash  . Vicodin [Hydrocodone-Acetaminophen] Rash    Health Maintenance Health Maintenance  Topic Date Due  . PNA vac Low Risk Adult (2 of 2 - PPSV23) 09/16/2017  . HIV Screening  11/21/2017 (Originally 09/17/1965)  . COLONOSCOPY  12/18/2019  . TETANUS/TDAP  04/24/2021  . INFLUENZA VACCINE  Completed  . Hepatitis C Screening  Completed     Exam:  BP (!) 158/81   Pulse 75   Wt 259 lb (117.5 kg)   BMI 41.80 kg/m  Gen: Well NAD HEENT: EOMI,  MMM Lungs: Normal work of breathing. CTABL Heart: RRR no MRG Abd: NABS, Soft. Nondistended, Nontender Exts: Brisk capillary refill, warm and well perfused.  Left calf: Large fluctuant swollen mass at  the left medial calf tender to palpation.  The overlying skin is mildly erythematous with no induration.  Foot motion is intact distally.  Capillary refill is intact distally. Left shoulder: Normal-appearing tender to palpation decrease abduction external and internal range of motion due to pain.  Patient guards with range of motion therefore impingement and strength testing not performed.  Limited musculoskeletal ultrasound of the left calf reveals a large hypoechoic fluid collection deep to the subcutaneous tissue consistent in appearance with seroma.  No significant Doppler flow seen within the cyst.  Normal bony structures.   Assessment and Plan: 67 y.o. male with  Hypertension: A bit elevated today but historically better with weight loss.  We called the pharmacy and determined that losartan is available.  Plan to start losartan 50 mg daily and recheck in about a month.  Left leg seroma: Improving with conservative management.  This is also managed with orthopedics.  My plan would be continued conservative management and consideration of aspiration if not resolving.  However Dr. Sharol Given seems to be primarily managing this problem.  We will  comanage.  Left shoulder pain: I agree this is likely rotator cuff tendinitis and after a recent fall and failing injection I think it is reasonable to proceed with MRI for surgical planning.  Continue comanagement with orthopedics.  I am happy to provide any assistance at this time.   No orders of the defined types were placed in this encounter.  Meds ordered this encounter  Medications  . testosterone cypionate (DEPOTESTOSTERONE CYPIONATE) injection 200 mg     Discussed warning signs or symptoms. Please see discharge instructions. Patient expresses understanding.

## 2017-10-02 NOTE — Progress Notes (Addendum)
Office Visit Note   Patient: Jonathan Yu           Date of Birth: 01/21/1951           MRN: [id number] Visit Date: 10/02/2017              Requested by: Gregor Hams, [address] [place], [place] [id number] PCP: Gregor Hams, MD  Chief Complaint  Patient presents with  . Left Leg - Follow-up    cellulitis and abscess   . Left Shoulder - Pain      HPI: Patient is a 67 year old gentleman who presents in follow-up for his left shoulder rotator cuff pathology as well as traumatic abrasion to the left calf.  Patient has been wearing compression stockings elevation and is currently on doxycycline and feels like his leg is getting better.  He was seen by his primary care physician and the option of aspiration of the seroma was suggested.  Assessment & Plan: Visit Diagnoses:  1. Chronic left shoulder pain   2. Cellulitis and abscess of left leg   3. Complete tear of left rotator cuff     Plan: Will set patient up for an MRI scan of the left shoulder to further evaluate the rotator cuff tear.  We will give him a prescription for Valium to help with scanner.  He will continue with compression stockings his leg is wrinkling well now and the seroma appears to be improving in the left calf we will hold on any open debridement.  Follow-Up Instructions: Return in about 2 weeks (around 10/16/2017).   Ortho Exam  Patient is alert, oriented, no adenopathy, well-dressed, normal affect, normal respiratory effort. Examination patient now has wrinkling of the skin there is no cellulitis there is no open wound there is no tenderness to palpation around the wound area.  Patient has abduction and flexion of only to 70 degrees he has pain with Neer and Hawkins impingement test pain with drop arm test.  Imaging: Xr Shoulder Left  Result Date: 10/02/2017 3 view radiographs of the right shoulder shows a congruent glenohumeral joint.  Patient has arthropathy of the [facility] joint with  decreased subacromial joint space consistent with a chronic rotator cuff tear.  No images are attached to the encounter.  Labs: Lab Results  Component Value Date   HGBA1C 6.1 (H) 07/01/2017   HGBA1C 5.4 06/11/2016   HGBA1C 5.4 09/23/2014   ESRSEDRATE 4 03/09/2014   ESRSEDRATE 5 01/05/2014   ESRSEDRATE 12 11/20/2013   CRP <0.5 03/09/2014   CRP <0.5 01/05/2014   CRP 1.2 (H) 11/20/2013   LABURIC 7.2 07/01/2017   REPTSTATUS 11/21/2013 FINAL 11/18/2013   REPTSTATUS 11/23/2013 FINAL 11/18/2013   GRAMSTAIN  11/18/2013    RARE WBC PRESENT,BOTH PMN AND MONONUCLEAR NO SQUAMOUS EPITHELIAL CELLS SEEN NO ORGANISMS SEEN Performed at [facility]  11/18/2013    RARE WBC PRESENT,BOTH PMN AND MONONUCLEAR NO SQUAMOUS EPITHELIAL CELLS SEEN NO ORGANISMS SEEN Performed at [facility]  11/18/2013    FEW STAPHYLOCOCCUS SPECIES (COAGULASE NEGATIVE) Performed at [facility]  11/18/2013    NO ANAEROBES ISOLATED Performed at [facility]    @LABSALLVALUES ([email or web address])@  Body mass index is 41.8 kg/m.  Orders:  Orders Placed This Encounter  Procedures  . XR Shoulder Left  . MR Shoulder Left w/o contrast   Meds ordered this encounter  Medications  . diazepam (VALIUM) 10  MG tablet    Sig: Take 1 tablet (10 mg total) by mouth every 6 (six) hours as needed. Take 1 tablet 30 minutes prior to procedure may repeat if needed.    Dispense:  5 tablet    Refill:  0     Procedures: No procedures performed  Clinical Data: No additional findings.  ROS:  All other systems negative, except as noted in the HPI. Review of Systems  Objective: Vital Signs: Ht 5\' 6"  (1.676 m)   Wt 259 lb (117.5 kg)   BMI 41.80 kg/m   Specialty Comments:  No specialty comments available.  PMFS History: Patient Active Problem List   Diagnosis Date Noted  . Seroma due to trauma ([place]) 10/02/2017  . Complete tear of left rotator cuff 10/02/2017  .  Chronic left shoulder pain 10/02/2017  . Pain and swelling of left lower leg 09/22/2017  . Acute pain of left shoulder 09/22/2017  . Cellulitis and abscess of left leg 09/22/2017  . GERD (gastroesophageal reflux disease) 07/02/2017  . Rosacea 05/29/2017  . Unilateral primary osteoarthritis, left knee 09/12/2016  . Idiopathic chronic venous hypertension of both lower extremities with inflammation 09/12/2016  . Muscle spasm 03/07/2016  . Morbid obesity ([place]) 12/21/2013  . Osteomyelitis ([place]) 12/21/2013  . History of total ankle replacement, right 08/18/2013  . ED (erectile dysfunction) 09/10/2011  . BPH (benign prostatic hyperplasia) 09/10/2011  . Kidney stones 01/28/2011  . Testosterone deficiency 01/24/2011  . OSA on CPAP 01/24/2011  . Right lumbar radiculopathy 01/24/2011  . Essential hypertension, benign 12/20/2010  . Dyslipidemia 12/20/2010   Past Medical History:  Diagnosis Date  . Allergy   . DDD (degenerative disc disease), lumbar   . Hyperlipidemia    "no meds since losing 106# 2 yr ago" (10/06/2013)  . Hypertension 2012   "no meds since losing 106# 2 yr ago" (10/06/2013)  . OSA on CPAP    "don't wear mask much since losing 106# 2 yr ago" (10/06/2013)  . Type II diabetes mellitus ([place])    "no meds since losing 106# 2 yr ago" (10/06/2013)  . Walking pneumonia 1968    Family History  Problem Relation Age of Onset  . Cancer Mother 49       Lung  . Alzheimer's disease Father   . Parkinson's disease Father   . Scoliosis Sister   . Cleft lip Sister     Past Surgical History:  Procedure Laterality Date  . ANKLE FRACTURE SURGERY Left 1973  . ANKLE RECONSTRUCTION Right 05/2011  . CARPAL TUNNEL RELEASE Bilateral 2001  . HARDWARE REMOVAL Right 10/07/2013   Procedure: RIGHT ANKLE HARDWARE REMOVAL;  Surgeon: Newt Minion, MD;  Location: [facility];  Service: Orthopedics;  Laterality: Right;  . I&D EXTREMITY Right 11/18/2013   Procedure: IRRIGATION AND DEBRIDEMENT EXTREMITY;   Surgeon: Newt Minion, MD;  Location: [facility];  Service: Orthopedics;  Laterality: Right;  Irrigation and Debridement Right Fibula , Place Antibiotic Beads and VAC  . NASAL SEPTUM SURGERY  1982  . ORIF ANKLE FRACTURE Right 09/08/2013   Procedure: REPAIR SYNDESMOSIS DISRUPTION RIGHT ANKLE;  Surgeon: Newt Minion, MD;  Location: [facility];  Service: Orthopedics;  Laterality: Right;  . SHOULDER ARTHROSCOPY W/ ROTATOR CUFF REPAIR Right 2006; 2009  . TONSILLECTOMY  1959  . TOTAL ANKLE ARTHROPLASTY Right 08/18/2013   Procedure: TOTAL ANKLE ARTHOPLASTY;  Surgeon: Newt Minion, MD;  Location: [facility];  Service: Orthopedics;  Laterality: Right;  Right Total Ankle Arthroplasty, Revision  Fibular Fracture  . URETERAL STENT PLACEMENT  ~ 2010 X 2   Social History   Occupational History  . Occupation: sales/ DJ  Tobacco Use  . Smoking status: Current Some Day Smoker    Years: 6.00    Types: Pipe, Cigars  . Smokeless tobacco: Never Used  . Tobacco comment: 10/06/2013 "use pipe or cigar 1-2 times/month"  Substance and Sexual Activity  . Alcohol use: Yes    Comment: 10/06/2013 "have 4-5 drinks/yr"  . Drug use: No  . Sexual activity: Yes    Comment: 1 every 2 months

## 2017-10-02 NOTE — Telephone Encounter (Signed)
Pharmacy needs correct directions for valium since there are 2 sets, CB # [phone]

## 2017-10-02 NOTE — Progress Notes (Signed)
xr lef

## 2017-10-02 NOTE — Patient Instructions (Signed)
Thank you for coming in today. Coordinate care with Dr Sharol Given. Let me know what the plan is.  I can drain the seroma as well if needed.   Start losartan 50 daily for BP and kidneys.    Seroma A seroma is a collection of fluid on the body that looks like swelling or a mass. Seromas form where tissue has been injured or cut. Seromas vary in size. Some are small and painless. Others may become large and cause pain or discomfort. Many seromas go away on their own as the fluid is naturally absorbed by the body, and some seromas need to be drained. What are the causes? Seromas form as the result of damage to tissue or the removal of tissue. This tissue damage may occur during surgery or because of an injury or trauma. When tissue is disrupted or removed, empty space is created. The body's natural defense system (immune system) causes fluid to enter the empty space and form a seroma. What are the signs or symptoms? Symptoms of this condition include:  Swelling at the site of a surgical cut (incision) or an injury.  Drainage of clear fluid at the surgery or injury site.  Discomfort or pain.  How is this diagnosed? This condition is diagnosed based on your symptoms, your medical history, and a physical exam. During the exam, your health care provider will press on the seroma. You may also have tests, including:  Blood tests.  Imaging tests, such as an ultrasound or CT scan.  How is this treated? Some seromas go away (resolve) on their own. Your health care provider may monitor you to make sure the seroma does not cause any complications. If your seroma does not resolve on its own, treatment may include:  Using a needle to drain the fluid from the seroma (needle aspiration).  Inserting a flexible tube (catheter) to drain the fluid.  Applying a bandage (dressing), such as an elastic bandage or binder.  Antibiotic medicines, if the seroma becomes infected.  In rare cases, surgery may be done  to remove the seroma and repair the area. Follow these instructions at home:  If you were prescribed an antibiotic medicine, take it as told by your health care provider. Do not stop taking the antibiotic even if you start to feel better.  Return to your normal activities as told by your health care provider. Ask your health care provider what activities are safe for you.  Take over-the-counter and prescription medicines only as told by your health care provider.  Check your seroma every day for signs of infection. Check for: ? Redness or pain. ? Fluid or pus. ? More swelling. ? Warmth.  Keep all follow-up visits as told by your health care provider. This is important. Contact a health care provider if:  You have a fever.  You have redness or pain at the site of the seroma.  You have fluid or pus coming from the seroma.  Your seroma is more swollen or is getting bigger.  Your seroma is warm to the touch. This information is not intended to replace advice given to you by your health care provider. Make sure you discuss any questions you have with your health care provider. Document Released: 11/23/2012 Document Revised: 05/10/2016 Document Reviewed: 05/10/2016 Elsevier Interactive Patient Education  2018 [employer].   Losartan tablets What is this medicine? LOSARTAN (loe SAR tan) is used to treat high blood pressure and to reduce the risk of stroke in certain patients.  This drug also slows the progression of kidney disease in patients with diabetes. This medicine may be used for other purposes; ask your health care provider or pharmacist if you have questions. COMMON BRAND NAME(S): Cozaar What should I tell my health care provider before I take this medicine? They need to know if you have any of these conditions: -heart failure -kidney or liver disease -an unusual or allergic reaction to losartan, other medicines, foods, dyes, or preservatives -pregnant or trying to get  pregnant -breast-feeding How should I use this medicine? Take this medicine by mouth with a glass of water. Follow the directions on the prescription label. This medicine can be taken with or without food. Take your doses at regular intervals. Do not take your medicine more often than directed. Talk to your pediatrician regarding the use of this medicine in children. Special care may be needed. Overdosage: If you think you have taken too much of this medicine contact a poison control center or emergency room at once. NOTE: This medicine is only for you. Do not share this medicine with others. What if I miss a dose? If you miss a dose, take it as soon as you can. If it is almost time for your next dose, take only that dose. Do not take double or extra doses. What may interact with this medicine? -blood pressure medicines -diuretics, especially triamterene, spironolactone, or amiloride -fluconazole -NSAIDs, medicines for pain and inflammation, like ibuprofen or naproxen -potassium salts or potassium supplements -rifampin This list may not describe all possible interactions. Give your health care provider a list of all the medicines, herbs, non-prescription drugs, or dietary supplements you use. Also tell them if you smoke, drink alcohol, or use illegal drugs. Some items may interact with your medicine. What should I watch for while using this medicine? Visit your doctor or health care professional for regular checks on your progress. Check your blood pressure as directed. Ask your doctor or health care professional what your blood pressure should be and when you should contact him or her. Call your doctor or health care professional if you notice an irregular or fast heart beat. Women should inform their doctor if they wish to become pregnant or think they might be pregnant. There is a potential for serious side effects to an unborn child, particularly in the second or third trimester. Talk to your  health care professional or pharmacist for more information. You may get drowsy or dizzy. Do not drive, use machinery, or do anything that needs mental alertness until you know how this drug affects you. Do not stand or sit up quickly, especially if you are an older patient. This reduces the risk of dizzy or fainting spells. Alcohol can make you more drowsy and dizzy. Avoid alcoholic drinks. Avoid salt substitutes unless you are told otherwise by your doctor or health care professional. Do not treat yourself for coughs, colds, or pain while you are taking this medicine without asking your doctor or health care professional for advice. Some ingredients may increase your blood pressure. What side effects may I notice from receiving this medicine? Side effects that you should report to your doctor or health care professional as soon as possible: -confusion, dizziness, light headedness or fainting spells -decreased amount of urine passed -difficulty breathing or swallowing, hoarseness, or tightening of the throat -fast or irregular heart beat, palpitations, or chest pain -skin rash, itching -swelling of your face, lips, tongue, hands, or feet Side effects that usually do not require  medical attention (report to your doctor or health care professional if they continue or are bothersome): -cough -decreased sexual function or desire -headache -nasal congestion or stuffiness -nausea or stomach pain -sore or cramping muscles This list may not describe all possible side effects. Call your doctor for medical advice about side effects. You may report side effects to FDA at 1-800-FDA-1088. Where should I keep my medicine? Keep out of the reach of children. Store at room temperature between 15 and 30 degrees C (59 and 86 degrees F). Protect from light. Keep container tightly closed. Throw away any unused medicine after the expiration date. NOTE: This sheet is a summary. It may not cover all possible  information. If you have questions about this medicine, talk to your doctor, pharmacist, or health care provider.  2018 Elsevier/Gold Standard (2007-10-09 16:42:18)

## 2017-10-02 NOTE — Telephone Encounter (Signed)
Called and lm on vm to advise that this is 10 mg 1 po 30-1 hr prior to procedure and repeat if needed.

## 2017-10-02 NOTE — Addendum Note (Signed)
Addended by: Meridee Score on: 10/02/2017 03:23 PM   Modules accepted: Level of Service

## 2017-10-16 ENCOUNTER — Ambulatory Visit (INDEPENDENT_AMBULATORY_CARE_PROVIDER_SITE_OTHER): Payer: Medicare PPO | Admitting: Orthopedic Surgery

## 2017-10-17 ENCOUNTER — Telehealth (INDEPENDENT_AMBULATORY_CARE_PROVIDER_SITE_OTHER): Payer: Self-pay

## 2017-10-17 NOTE — Telephone Encounter (Signed)
Faxed order to [facility] in [place] to [phone].

## 2017-10-20 ENCOUNTER — Ambulatory Visit (INDEPENDENT_AMBULATORY_CARE_PROVIDER_SITE_OTHER): Payer: Medicare PPO

## 2017-10-20 DIAGNOSIS — S46812D Strain of other muscles, fascia and tendons at shoulder and upper arm level, left arm, subsequent encounter: Secondary | ICD-10-CM | POA: Diagnosis not present

## 2017-10-20 DIAGNOSIS — W138XXD Fall from, out of or through other building or structure, subsequent encounter: Secondary | ICD-10-CM

## 2017-10-20 DIAGNOSIS — S46112D Strain of muscle, fascia and tendon of long head of biceps, left arm, subsequent encounter: Secondary | ICD-10-CM

## 2017-10-20 DIAGNOSIS — M19012 Primary osteoarthritis, left shoulder: Secondary | ICD-10-CM | POA: Diagnosis not present

## 2017-10-20 DIAGNOSIS — M25512 Pain in left shoulder: Principal | ICD-10-CM

## 2017-10-20 DIAGNOSIS — G8929 Other chronic pain: Secondary | ICD-10-CM

## 2017-10-22 ENCOUNTER — Ambulatory Visit (INDEPENDENT_AMBULATORY_CARE_PROVIDER_SITE_OTHER): Payer: Medicare PPO | Admitting: Orthopedic Surgery

## 2017-10-23 ENCOUNTER — Ambulatory Visit (INDEPENDENT_AMBULATORY_CARE_PROVIDER_SITE_OTHER): Payer: Medicare PPO | Admitting: Orthopedic Surgery

## 2017-10-23 ENCOUNTER — Ambulatory Visit: Payer: Medicare PPO

## 2017-10-23 ENCOUNTER — Encounter (INDEPENDENT_AMBULATORY_CARE_PROVIDER_SITE_OTHER): Payer: Self-pay | Admitting: Orthopedic Surgery

## 2017-10-23 VITALS — Ht 66.0 in | Wt 259.0 lb

## 2017-10-23 DIAGNOSIS — M75122 Complete rotator cuff tear or rupture of left shoulder, not specified as traumatic: Secondary | ICD-10-CM | POA: Diagnosis not present

## 2017-10-23 DIAGNOSIS — I87323 Chronic venous hypertension (idiopathic) with inflammation of bilateral lower extremity: Secondary | ICD-10-CM | POA: Diagnosis not present

## 2017-10-23 NOTE — Progress Notes (Signed)
Office Visit Note   Patient: Jonathan Yu           Date of Birth: Oct 31, 1950           MRN: [id number] Visit Date: 10/23/2017              Requested by: Gregor Hams, MD [address] [place], [place] [address] PCP: Gregor Hams, MD  Chief Complaint  Patient presents with  . Right Shoulder - Pain    S/p MRI 10/20/17  . Left Leg - Pain    followup cellulitis      HPI: Patient is a 67 year old gentleman who is status post hematoma to the medial aspect of the left calf with a chronic left shoulder rotator cuff tear.  Patient states the does not have full range of motion of the shoulder has pain with activities of daily living he has completed his course of doxycycline he is not wearing the compression stockings for the left lower extremity at this time.  Assessment & Plan: Visit Diagnoses:  1. Complete tear of left rotator cuff   2. Idiopathic chronic venous hypertension of both lower extremities with inflammation     Plan: Recommend he wear the compression stockings daily there is no signs of infection no further need for doxycycline for the left calf.  Patient does use a CPAP machine with his chronic retracted rotator cuff tear his best option would be debridement and decompression.  Risks and benefits were discussed feel that he should achieve approximately 75% improvement in his symptoms.  We will plan for surgery at [facility] due to his sleep apnea.  Follow-Up Instructions: Return in about 2 weeks (around 11/06/2017).   Ortho Exam  Patient is alert, oriented, no adenopathy, well-dressed, normal affect, normal respiratory effort. Examination patient has active abduction of flexion to 70 degrees of the left shoulder.  He has pain with [place] Hawkins impingement test he has pain to palpation over the biceps tendon.  The hematoma is resolving in the left calf there is no signs of infection.  Review of the MRI scan of the left shoulder shows a complete tear and retraction  of the rotator cuff proximal to the glenoid.  he also has  atrophy of the muscle belly, tear of the long head of the biceps, and with AC spurs with osteoarthritis.  Imaging: No results found. No images are attached to the encounter.  Labs: Lab Results  Component Value Date   HGBA1C 6.1 (H) 07/01/2017   HGBA1C 5.4 06/11/2016   HGBA1C 5.4 09/23/2014   ESRSEDRATE 4 03/09/2014   ESRSEDRATE 5 01/05/2014   ESRSEDRATE 12 11/20/2013   CRP <0.5 03/09/2014   CRP <0.5 01/05/2014   CRP 1.2 (H) 11/20/2013   LABURIC 7.2 07/01/2017   REPTSTATUS 11/21/2013 FINAL 11/18/2013   REPTSTATUS 11/23/2013 FINAL 11/18/2013   GRAMSTAIN  11/18/2013    RARE WBC PRESENT,BOTH PMN AND MONONUCLEAR NO SQUAMOUS EPITHELIAL CELLS SEEN NO ORGANISMS SEEN Performed at [facility]  11/18/2013    RARE WBC PRESENT,BOTH PMN AND MONONUCLEAR NO SQUAMOUS EPITHELIAL CELLS SEEN NO ORGANISMS SEEN Performed at [facility]  11/18/2013    FEW STAPHYLOCOCCUS SPECIES (COAGULASE NEGATIVE) Performed at [facility]  11/18/2013    NO ANAEROBES ISOLATED Performed at [facility]    @LABSALLVALUES (HGBA1)@  Body mass index is 41.8 kg/m.  Orders:  No orders of the defined types were placed in  this encounter.  No orders of the defined types were placed in this encounter.    Procedures: No procedures performed  Clinical Data: No additional findings.  ROS:  All other systems negative, except as noted in the HPI. Review of Systems  Objective: Vital Signs: Ht 5\' 6"  (1.676 m)   Wt 259 lb (117.5 kg)   BMI 41.80 kg/m   Specialty Comments:  No specialty comments available.  PMFS History: Patient Active Problem List   Diagnosis Date Noted  . Seroma due to trauma ([place]) 10/02/2017  . Complete tear of left rotator cuff 10/02/2017  . Chronic left shoulder pain 10/02/2017  . Pain and swelling of left lower leg 09/22/2017  . Acute pain of left shoulder  09/22/2017  . Cellulitis and abscess of left leg 09/22/2017  . GERD (gastroesophageal reflux disease) 07/02/2017  . Rosacea 05/29/2017  . Unilateral primary osteoarthritis, left knee 09/12/2016  . Idiopathic chronic venous hypertension of both lower extremities with inflammation 09/12/2016  . Muscle spasm 03/07/2016  . Morbid obesity ([place]) 12/21/2013  . Osteomyelitis ([place]) 12/21/2013  . History of total ankle replacement, right 08/18/2013  . ED (erectile dysfunction) 09/10/2011  . BPH (benign prostatic hyperplasia) 09/10/2011  . Kidney stones 01/28/2011  . Testosterone deficiency 01/24/2011  . OSA on CPAP 01/24/2011  . Right lumbar radiculopathy 01/24/2011  . Essential hypertension, benign 12/20/2010  . Dyslipidemia 12/20/2010   Past Medical History:  Diagnosis Date  . Allergy   . DDD (degenerative disc disease), lumbar   . Hyperlipidemia    "no meds since losing 106# 2 yr ago" (10/06/2013)  . Hypertension 2012   "no meds since losing 106# 2 yr ago" (10/06/2013)  . OSA on CPAP    "don't wear mask much since losing 106# 2 yr ago" (10/06/2013)  . Type II diabetes mellitus ([place])    "no meds since losing 106# 2 yr ago" (10/06/2013)  . Walking pneumonia 1968    Family History  Problem Relation Age of Onset  . Cancer Mother 83       Lung  . Alzheimer's disease Father   . Parkinson's disease Father   . Scoliosis Sister   . Cleft lip Sister     Past Surgical History:  Procedure Laterality Date  . ANKLE FRACTURE SURGERY Left 1973  . ANKLE RECONSTRUCTION Right 05/2011  . CARPAL TUNNEL RELEASE Bilateral 2001  . HARDWARE REMOVAL Right 10/07/2013   Procedure: RIGHT ANKLE HARDWARE REMOVAL;  Surgeon: Newt Minion, MD;  Location: [facility];  Service: Orthopedics;  Laterality: Right;  . I&D EXTREMITY Right 11/18/2013   Procedure: IRRIGATION AND DEBRIDEMENT EXTREMITY;  Surgeon: Newt Minion, MD;  Location: [facility];  Service: Orthopedics;  Laterality: Right;  Irrigation and Debridement Right  Fibula , Place Antibiotic Beads and VAC  . NASAL SEPTUM SURGERY  1982  . ORIF ANKLE FRACTURE Right 09/08/2013   Procedure: REPAIR SYNDESMOSIS DISRUPTION RIGHT ANKLE;  Surgeon: Newt Minion, MD;  Location: [facility];  Service: Orthopedics;  Laterality: Right;  . SHOULDER ARTHROSCOPY W/ ROTATOR CUFF REPAIR Right 2006; 2009  . TONSILLECTOMY  1959  . TOTAL ANKLE ARTHROPLASTY Right 08/18/2013   Procedure: TOTAL ANKLE ARTHOPLASTY;  Surgeon: Newt Minion, MD;  Location: [facility];  Service: Orthopedics;  Laterality: Right;  Right Total Ankle Arthroplasty, Revision Fibular Fracture  . URETERAL STENT PLACEMENT  ~ 2010 X 2   Social History   Occupational History  . Occupation: sales/ DJ  Tobacco Use  . Smoking status:  Current Some Day Smoker    Years: 6.00    Types: Pipe, Cigars  . Smokeless tobacco: Never Used  . Tobacco comment: 10/06/2013 "use pipe or cigar 1-2 times/month"  Substance and Sexual Activity  . Alcohol use: Yes    Comment: 10/06/2013 "have 4-5 drinks/yr"  . Drug use: No  . Sexual activity: Yes    Comment: 1 every 2 months

## 2017-10-24 ENCOUNTER — Ambulatory Visit (INDEPENDENT_AMBULATORY_CARE_PROVIDER_SITE_OTHER): Payer: Medicare PPO | Admitting: Osteopathic Medicine

## 2017-10-24 ENCOUNTER — Telehealth (INDEPENDENT_AMBULATORY_CARE_PROVIDER_SITE_OTHER): Payer: Self-pay | Admitting: Orthopedic Surgery

## 2017-10-24 VITALS — BP 134/82 | HR 72

## 2017-10-24 DIAGNOSIS — E291 Testicular hypofunction: Secondary | ICD-10-CM | POA: Diagnosis not present

## 2017-10-24 MED ORDER — TESTOSTERONE CYPIONATE 200 MG/ML IM SOLN
200.0000 mg | Freq: Once | INTRAMUSCULAR | Status: AC
  Administered 2017-10-24: 200 mg via INTRAMUSCULAR

## 2017-10-24 NOTE — Progress Notes (Signed)
Pt advised.

## 2017-10-24 NOTE — Progress Notes (Signed)
Patient came into office today for testosterone injection. Denies chest pain, shortness of breath, headaches and problems associated with taking this medication. Patient states he has had no abnornal mood swings. Patient tolerated injection in [place] well without complications. Patient advised to schedule his next injection for 3 weeks from today. Patient does question if he should start getting his testosterone injections every 14 days vs 21 days. Will route for review.

## 2017-10-24 NOTE — Telephone Encounter (Signed)
Patient needs to know if he should wear his compression socks while he sleeps. Can leave a message if needed # [phone]

## 2017-10-24 NOTE — Telephone Encounter (Signed)
Spoke with patient and advised he can remove the socks at night and put them back on each morning.

## 2017-10-24 NOTE — Progress Notes (Signed)
Looks like Dr. Georgina Snell ordered the testosterone for every 21 days. Would have him follow up in 3 weeks for BP injection. Any other questions, let me know!

## 2017-10-27 ENCOUNTER — Telehealth (INDEPENDENT_AMBULATORY_CARE_PROVIDER_SITE_OTHER): Payer: Self-pay

## 2017-10-27 NOTE — Telephone Encounter (Signed)
Called pt with followup on the question he had on Friday about his compression stocking.  Per Dr. Sharol Given, ideally he needs to work his way up to wearing the stocking all the time.  Pt will try this.

## 2017-11-13 ENCOUNTER — Ambulatory Visit (INDEPENDENT_AMBULATORY_CARE_PROVIDER_SITE_OTHER): Payer: Medicare PPO | Admitting: Family Medicine

## 2017-11-13 VITALS — BP 132/74 | HR 62 | Temp 98.5°F | Wt 268.6 lb

## 2017-11-13 DIAGNOSIS — E291 Testicular hypofunction: Secondary | ICD-10-CM

## 2017-11-13 MED ORDER — TESTOSTERONE CYPIONATE 200 MG/ML IM SOLN
200.0000 mg | Freq: Once | INTRAMUSCULAR | Status: AC
  Administered 2017-11-13: 200 mg via INTRAMUSCULAR

## 2017-11-13 NOTE — Progress Notes (Signed)
Pt presented today for testosterone injection. Denies chest pain, SOB, headaches, mood changes or problems with medication. Pt stated feeling stressed today with daily life activities. Pt's blood pressure reading for today was 134/74, pulse 62. Pt was notified that he is due for pneumonia injection #2, as per pt - he will complete vaccine at next visit with pcp. Last injection was on LUOQ. No induration or bruising noted on injection site. Pt tolerated injection on RUOQ well without complications. Patient was advised to make next appt for testosterone injection in 3 wks & is aware that we will call him for any updates or changes.

## 2017-12-04 ENCOUNTER — Ambulatory Visit (INDEPENDENT_AMBULATORY_CARE_PROVIDER_SITE_OTHER): Payer: Medicare PPO | Admitting: Family Medicine

## 2017-12-04 VITALS — BP 134/81 | HR 64

## 2017-12-04 DIAGNOSIS — E291 Testicular hypofunction: Secondary | ICD-10-CM | POA: Diagnosis not present

## 2017-12-04 MED ORDER — TESTOSTERONE CYPIONATE 200 MG/ML IM SOLN
200.0000 mg | Freq: Once | INTRAMUSCULAR | Status: AC
  Administered 2017-12-04: 200 mg via INTRAMUSCULAR

## 2017-12-04 NOTE — Progress Notes (Signed)
Testosterone injection 200mg  given in [place] with no complication. Pt to return in 3 weeks for next injection.

## 2017-12-25 ENCOUNTER — Ambulatory Visit (INDEPENDENT_AMBULATORY_CARE_PROVIDER_SITE_OTHER): Payer: Medicare PPO | Admitting: Family Medicine

## 2017-12-25 VITALS — BP 122/73 | HR 57 | Wt 255.0 lb

## 2017-12-25 DIAGNOSIS — E291 Testicular hypofunction: Secondary | ICD-10-CM | POA: Diagnosis not present

## 2017-12-25 MED ORDER — TESTOSTERONE CYPIONATE 200 MG/ML IM SOLN
200.0000 mg | Freq: Once | INTRAMUSCULAR | Status: AC
  Administered 2017-12-25: 200 mg via INTRAMUSCULAR

## 2017-12-25 MED ORDER — LOSARTAN POTASSIUM 50 MG PO TABS: 50.0000 mg | ORAL_TABLET | Freq: Every day | ORAL | 0 refills | Status: DC

## 2017-12-25 NOTE — Progress Notes (Signed)
Patient came into office today for testosterone injection. Denies chest pain, shortness of breath, headaches and problems associated with taking this medication. Patient states he has had no abnornal mood swings. Patient tolerated injection in [place] (as requested) well without complications. Pt was due to get injection in [place] but he was having some right buttock pain from working on his car. Patient advised to schedule his next injection for 3 weeks from today. Pt also request refill on losartan. Rx sent.

## 2017-12-25 NOTE — Progress Notes (Signed)
Vitals:   12/25/17 1057  BP: 122/73  Pulse: (!) 57   BP controlled.  GFR controlled at last check.  OK to refill losartan.

## 2018-01-14 ENCOUNTER — Ambulatory Visit (INDEPENDENT_AMBULATORY_CARE_PROVIDER_SITE_OTHER): Payer: Medicare PPO | Admitting: Family Medicine

## 2018-01-14 DIAGNOSIS — E291 Testicular hypofunction: Secondary | ICD-10-CM

## 2018-01-14 MED ORDER — TESTOSTERONE CYPIONATE 200 MG/ML IM SOLN
200.0000 mg | Freq: Once | INTRAMUSCULAR | Status: AC
  Administered 2018-01-14: 200 mg via INTRAMUSCULAR

## 2018-01-14 NOTE — Progress Notes (Signed)
   Subjective:    Patient ID: Jonathan Yu, male    DOB: 02/24/1951, 67 y.o.   MRN: [id number]  HPI  Jonathan Yu is here for a testosterone injection. Denies chest pain, shortness of breath, headaches or mood changes.   Review of Systems     Objective:   Physical Exam        Assessment & Plan:  Patient tolerated injection well without complications. Patient advised to schedule next injection 21 days from today.  

## 2018-02-05 ENCOUNTER — Ambulatory Visit (INDEPENDENT_AMBULATORY_CARE_PROVIDER_SITE_OTHER): Payer: Medicare PPO | Admitting: Family Medicine

## 2018-02-05 VITALS — BP 146/80 | HR 62 | Wt 261.0 lb

## 2018-02-05 DIAGNOSIS — E291 Testicular hypofunction: Secondary | ICD-10-CM | POA: Diagnosis not present

## 2018-02-05 MED ORDER — TESTOSTERONE CYPIONATE 200 MG/ML IM SOLN
200.0000 mg | INTRAMUSCULAR | Status: DC
  Administered 2018-02-05: 200 mg via INTRAMUSCULAR

## 2018-02-05 NOTE — Progress Notes (Signed)
   Subjective:    Patient ID: Jonathan Yu, male    DOB: 07/29/1951, 67 y.o.   MRN: [id number]  HPI Patient comes into office today for testosterone injections. Patient has no complaints of chest pain, palpitations, SOB, dizziness or any mood changes. Patient tolerated injection well without any complications. KG LPN  Patient states hasnt taken blood pressure medication this morning. KG LPN  Review of Systems     Objective:   Physical Exam        Assessment & Plan:  Advised patient to follow up for next injection in 3 weeks. KG LPN

## 2018-02-26 ENCOUNTER — Ambulatory Visit (INDEPENDENT_AMBULATORY_CARE_PROVIDER_SITE_OTHER): Payer: Medicare PPO | Admitting: Family Medicine

## 2018-02-26 VITALS — BP 137/78 | HR 75 | Temp 98.5°F | Wt 264.0 lb

## 2018-02-26 DIAGNOSIS — E291 Testicular hypofunction: Secondary | ICD-10-CM | POA: Diagnosis not present

## 2018-02-26 MED ORDER — TESTOSTERONE CYPIONATE 200 MG/ML IM SOLN
200.0000 mg | Freq: Once | INTRAMUSCULAR | Status: AC
  Administered 2018-02-26: 200 mg via INTRAMUSCULAR

## 2018-02-26 NOTE — Progress Notes (Signed)
   Subjective:    Patient ID: Jonathan Yu, male    DOB: [birth year], 67 y.o.   MRN: [id number]  HPI Patient is here for a testosterone injection. Denies chest pain, shortness of breath, headaches, problems with mood change, or medication problems.    Review of Systems     Objective:   Physical Exam        Assessment & Plan:  Patient tolerated injection in [place] well without complications. Patient advised to schedule his next injection for 3 weeks from today.

## 2018-03-19 ENCOUNTER — Ambulatory Visit (INDEPENDENT_AMBULATORY_CARE_PROVIDER_SITE_OTHER): Payer: Medicare PPO | Admitting: Sports Medicine

## 2018-03-19 VITALS — BP 134/81 | HR 65

## 2018-03-19 DIAGNOSIS — E291 Testicular hypofunction: Secondary | ICD-10-CM

## 2018-03-19 MED ORDER — TESTOSTERONE CYPIONATE 200 MG/ML IM SOLN
200.0000 mg | Freq: Once | INTRAMUSCULAR | Status: AC
  Administered 2018-03-19: 200 mg via INTRAMUSCULAR

## 2018-03-19 NOTE — Progress Notes (Signed)
Jonathan Yu presents to the clinic for a testosterone injection. Denies SOB, chest pain, headache, and mood changes. Pt tolerated injection in [place] well without complications.  Pt advise to make next appointment in 21 days. -[facility]

## 2018-03-31 ENCOUNTER — Other Ambulatory Visit: Payer: Self-pay | Admitting: Family Medicine

## 2018-04-09 ENCOUNTER — Ambulatory Visit: Payer: Medicare PPO

## 2018-04-10 ENCOUNTER — Ambulatory Visit (INDEPENDENT_AMBULATORY_CARE_PROVIDER_SITE_OTHER): Payer: Medicare PPO | Admitting: Family Medicine

## 2018-04-10 ENCOUNTER — Telehealth: Payer: Self-pay

## 2018-04-10 VITALS — BP 137/72 | HR 76

## 2018-04-10 DIAGNOSIS — E291 Testicular hypofunction: Secondary | ICD-10-CM

## 2018-04-10 MED ORDER — TESTOSTERONE CYPIONATE 200 MG/ML IM SOLN
200.0000 mg | Freq: Once | INTRAMUSCULAR | Status: AC
  Administered 2018-04-10: 200 mg via INTRAMUSCULAR

## 2018-04-10 MED ORDER — TADALAFIL 20 MG PO TABS: 10.0000 mg | ORAL_TABLET | ORAL | 11 refills | Status: DC | PRN

## 2018-04-10 NOTE — Progress Notes (Signed)
   Subjective:    Patient ID: Jonathan Yu, male    DOB: 08/25/1950, 67 y.o.   MRN: [id number]  HPI  Treyvonne Tata is here for a testosterone injection. Denies chest pain, shortness of breath, headaches or mood changes.   Review of Systems     Objective:   Physical Exam        Assessment & Plan:  Patient tolerated injection well without complications. Patient advised to schedule next injection 21 days from today.

## 2018-04-10 NOTE — Telephone Encounter (Signed)
Jguadalupe would like to switch to Cialis and would like a 90 day sent to [facility] in [place]. Please advise.

## 2018-04-10 NOTE — Telephone Encounter (Signed)
Pt advised.

## 2018-04-10 NOTE — Telephone Encounter (Signed)
Cialis sent to [facility] in [place].  Take every other day as needed for sex.

## 2018-04-30 ENCOUNTER — Ambulatory Visit (INDEPENDENT_AMBULATORY_CARE_PROVIDER_SITE_OTHER): Payer: Medicare PPO | Admitting: Family Medicine

## 2018-04-30 VITALS — BP 164/78 | HR 56 | Temp 97.7°F | Wt 270.0 lb

## 2018-04-30 DIAGNOSIS — R7303 Prediabetes: Secondary | ICD-10-CM

## 2018-04-30 DIAGNOSIS — Z23 Encounter for immunization: Secondary | ICD-10-CM

## 2018-04-30 DIAGNOSIS — G4733 Obstructive sleep apnea (adult) (pediatric): Secondary | ICD-10-CM

## 2018-04-30 DIAGNOSIS — I10 Essential (primary) hypertension: Secondary | ICD-10-CM

## 2018-04-30 DIAGNOSIS — E291 Testicular hypofunction: Secondary | ICD-10-CM

## 2018-04-30 DIAGNOSIS — Z9989 Dependence on other enabling machines and devices: Secondary | ICD-10-CM

## 2018-04-30 DIAGNOSIS — N400 Enlarged prostate without lower urinary tract symptoms: Secondary | ICD-10-CM

## 2018-04-30 MED ORDER — TESTOSTERONE CYPIONATE 200 MG/ML IM SOLN
200.0000 mg | Freq: Once | INTRAMUSCULAR | Status: AC
  Administered 2018-04-30: 200 mg via INTRAMUSCULAR

## 2018-04-30 NOTE — Progress Notes (Signed)
Blood pressure elevated and patient overdue for labs.  Plan for fasting lab recheck in the interval between the next follow-up.  Recheck blood pressure in 3 weeks at next testosterone injection.  Also complete screening for depression at that visit as well.

## 2018-04-30 NOTE — Progress Notes (Signed)
   Subjective:    Patient ID: Jonathan Yu, male    DOB: 1951/01/12, 67 y.o.   MRN: [id number]  HPI Patient is here for a testosterone injection. Denies chest pain, shortness of breath, headaches, problems with mood change, or medication problems.  Blood pressure was high at first check and also at recheck after 10 minutes of rest.    Review of Systems     Objective:   Physical Exam        Assessment & Plan:  Patient tolerated testosterone injection in [place] and flu shot in left deltoid well without complications. Patient advised to schedule his next injection for 3 weeks from today.  Vitals reviewed with Dr Georgina Snell, he has ordered labs and pt was instructed to have labs done in 1 to 1 and 1/2 weeks, fasting, before 10 AM. Will recheck BP in 3 weeks at next nurse visit.

## 2018-05-11 DIAGNOSIS — G4733 Obstructive sleep apnea (adult) (pediatric): Secondary | ICD-10-CM | POA: Diagnosis not present

## 2018-05-11 DIAGNOSIS — Z9989 Dependence on other enabling machines and devices: Secondary | ICD-10-CM | POA: Diagnosis not present

## 2018-05-11 DIAGNOSIS — I10 Essential (primary) hypertension: Secondary | ICD-10-CM | POA: Diagnosis not present

## 2018-05-11 DIAGNOSIS — R7303 Prediabetes: Secondary | ICD-10-CM | POA: Diagnosis not present

## 2018-05-11 DIAGNOSIS — E291 Testicular hypofunction: Secondary | ICD-10-CM | POA: Diagnosis not present

## 2018-05-11 DIAGNOSIS — N400 Enlarged prostate without lower urinary tract symptoms: Secondary | ICD-10-CM | POA: Diagnosis not present

## 2018-05-12 LAB — PSA: PSA: 2 ng/mL (ref <=4.0)

## 2018-05-12 LAB — COMPLETE METABOLIC PANEL WITH GFR
AG Ratio: 1.5 (calc) (ref 1.0–2.5)
ALT: 29 U/L (ref 9–46)
AST: 20 U/L (ref 10–35)
Albumin: 3.9 g/dL (ref 3.6–5.1)
Alkaline phosphatase (APISO): 40 U/L (ref 40–115)
BUN: 14 mg/dL (ref 7–25)
CO2: 29 mmol/L (ref 20–32)
Calcium: 9.3 mg/dL (ref 8.6–10.3)
Chloride: 102 mmol/L (ref 98–110)
Creat: 0.96 mg/dL (ref 0.70–1.25)
GFR, Est African American: 96 mL/min/{1.73_m2} (ref >=60)
GFR, Est Non African American: 83 mL/min/{1.73_m2} (ref >=60)
Globulin: 2.6 g/dL (calc) (ref 1.9–3.7)
Glucose, Bld: 116 mg/dL — ABNORMAL HIGH (ref 65–99)
Potassium: 4.5 mmol/L (ref 3.5–5.3)
Sodium: 138 mmol/L (ref 135–146)
Total Bilirubin: 2.2 mg/dL — ABNORMAL HIGH (ref 0.2–1.2)
Total Protein: 6.5 g/dL (ref 6.1–8.1)

## 2018-05-12 LAB — LIPID PANEL W/REFLEX DIRECT LDL
Cholesterol: 200 mg/dL — ABNORMAL HIGH (ref <200)
HDL: 37 mg/dL — ABNORMAL LOW (ref >40)
LDL Cholesterol (Calc): 129 mg/dL (calc) — ABNORMAL HIGH
Non-HDL Cholesterol (Calc): 163 mg/dL (calc) — ABNORMAL HIGH (ref <130)
Total CHOL/HDL Ratio: 5.4 (calc) — ABNORMAL HIGH (ref <5.0)
Triglycerides: 200 mg/dL — ABNORMAL HIGH (ref <150)

## 2018-05-12 LAB — CBC
HCT: 49.1 % (ref 38.5–50.0)
Hemoglobin: 16.7 g/dL (ref 13.2–17.1)
MCH: 29.9 pg (ref 27.0–33.0)
MCHC: 34 g/dL (ref 32.0–36.0)
MCV: 88 fL (ref 80.0–100.0)
MPV: 9.9 fL (ref 7.5–12.5)
Platelets: 224 10*3/uL (ref 140–400)
RBC: 5.58 10*6/uL (ref 4.20–5.80)
RDW: 12.6 % (ref 11.0–15.0)
WBC: 6.7 10*3/uL (ref 3.8–10.8)

## 2018-05-12 LAB — HEMOGLOBIN A1C
Hgb A1c MFr Bld: 5.8 % of total Hgb — ABNORMAL HIGH (ref <5.7)
Mean Plasma Glucose: 120 (calc)
eAG (mmol/L): 6.6 (calc)

## 2018-05-12 LAB — TESTOSTERONE: Testosterone: 317 ng/dL (ref 250–827)

## 2018-05-13 MED ORDER — ATORVASTATIN CALCIUM 40 MG PO TABS: 40.0000 mg | ORAL_TABLET | Freq: Every day | ORAL | 3 refills | Status: DC

## 2018-05-13 NOTE — Addendum Note (Signed)
Addended by: Gregor Hams on: 05/13/2018 05:44 AM   Modules accepted: Orders

## 2018-05-21 ENCOUNTER — Ambulatory Visit (INDEPENDENT_AMBULATORY_CARE_PROVIDER_SITE_OTHER): Payer: Medicare PPO | Admitting: Family Medicine

## 2018-05-21 VITALS — BP 134/85 | HR 72 | Wt 268.0 lb

## 2018-05-21 DIAGNOSIS — L570 Actinic keratosis: Secondary | ICD-10-CM

## 2018-05-21 DIAGNOSIS — E291 Testicular hypofunction: Secondary | ICD-10-CM | POA: Diagnosis not present

## 2018-05-21 MED ORDER — TESTOSTERONE CYPIONATE 200 MG/ML IM SOLN
200.0000 mg | Freq: Once | INTRAMUSCULAR | Status: AC
  Administered 2018-05-21: 200 mg via INTRAMUSCULAR

## 2018-05-21 MED ORDER — CETIRIZINE HCL 10 MG PO TABS: 10.0000 mg | ORAL_TABLET | Freq: Every day | ORAL | 3 refills | Status: DC

## 2018-05-21 NOTE — Patient Instructions (Signed)
Thank you for coming in today. Keep the skin covered if it crusts.  Recheck as needed.    Actinic Keratosis An actinic keratosis is a precancerous growth on the skin. This means that it could develop into skin cancer if it is not treated. About 1% of these growths (actinic keratoses) turn into skin cancer within one year if they are not treated. It is important to have all of these growths evaluated to determine the best treatment approach. What are the causes? This condition is caused by getting too much ultraviolet (UV) radiation from the sun or other UV light sources. What increases the risk? The following factors may make you more likely to develop this condition:  Having light-colored skin and blue eyes.  Having blonde or red hair.  Spending a lot of time in the sun.  Inadequate skin protection when outdoors. This may include: ? Not using sunscreen properly. ? Not covering up skin that is exposed to sunlight.  Aging. The risk of developing an actinic keratosis increases with age.  What are the signs or symptoms? Actinic keratoses look like scaly, rough spots of skin.They can be as small as a pinhead or as big as a quarter. They may itch, hurt, or feel sensitive. In most cases, the growths become red. In some cases, they may be skin-colored, light tan, dark tan, pink, or a combination of any of these colors. There may be a small piece of pink or gray skin (skin tag) growing from the actinic keratosis. In some cases, it may be easier to notice actinic keratoses by feeling them, rather than seeing them. Actinic keratoses appear most often on areas of skin that get a lot of sun exposure, including the scalp, face, ears, lips, upper back, forearms, and the backs of the hands. Sometimes, actinic keratoses disappear, but many reappear a few days to a few weeks later. How is this diagnosed? This condition is usually diagnosed with a physical exam. A tissue sample may be removed from the  actinic keratosis and examined under a microscope (biopsy). How is this treated?  Treatment for this condition may include:  Scraping off the actinic keratosis (curettage).  Freezing the actinic keratosis with liquid nitrogen (cryosurgery). This causes the growth to eventually fall off the skin.  Applying medicated creams or gels to destroy the cells in the growth.  Applying chemicals to the actinic keratosis to make the outer layers of skin peel off (chemical peel).  Photodynamic therapy. In this procedure, medicated cream is applied to the actinic keratosis. This cream increases your skin's sensitivity to light. Then, a strong light is aimed at the actinic keratosis to destroy cells in the growth.  Follow these instructions at home: Skin care  Apply cool, wet cloths (cool compresses) to the affected areas.  Do not scratch your skin.  Check your skin regularly for any growths, especially growths that: ? Start to itch or bleed. ? Change in size, shape, or color. Caring for the treated area  Keep the treated area clean and dry as told by your health care provider.  Do not apply any medicine, cream, or lotion to the treated area unless your health care provider tells you to do that.  Do not pick at blisters or try to break them open. This can cause infection and scarring.  If you have red or irritated skin after treatment, follow instructions from your health care provider about how to take care of the treated area. Make sure you: ? Wash  your hands with soap and water before you change your bandage (dressing). If soap and water are not available, use hand sanitizer. ? Change your dressing as told by your health care provider.  If you have red or irritated skin after treatment, check your treated area every day for signs of infection. Check for: ? Swelling, pain, or more redness. ? Fluid or blood. ? Warmth. ? Pus or a bad smell. General instructions  Take over-the-counter and  prescription medicines only as told by your health care provider.  Return to your normal activities as told by your health care provider. Ask your health care provider what activities are safe for you.  Do not use any tobacco products, such as cigarettes, chewing tobacco, and e-cigarettes. If you need help quitting, ask your health care provider.  Have a skin exam done every year by a health care provider who is a skin conditions specialist (dermatologist).  Keep all follow-up visits as told by your health care provider. This is important. How is this prevented?  Do not get sunburns.  Try to avoid the sun between 10:00 a.m. and 4:00 p.m. This is when the UV light is the strongest.  Use a sunscreen or sunblock with SPF 30 (sun protection factor 30) or greater.  Apply sunscreen before you are exposed to sunlight, and reapply periodically as often as directed by the instructions on the sunscreen container.  Always wear sunglasses that have UV protection, and always wear hats and clothing to protect your skin from sunlight.  When possible, avoid medicines that increase your sensitivity to sunlight. These include: ? Certain antibiotic medicines. ? Certain water pills (diuretics). ? Certain prescription medicines that are used to treat acne (retinoids).  Do not use tanning beds or other indoor tanning devices. Contact a health care provider if:  You notice any changes or new growths on your skin.  You have swelling, pain, or more redness around your treated area.  You have fluid or blood coming from your treated area.  Your treated area feels warm to the touch.  You have pus or a bad smell coming from your treated area.  You have a fever.  You have a blister that becomes large and painful. This information is not intended to replace advice given to you by your health care provider. Make sure you discuss any questions you have with your health care provider. Document Released:  10/25/2008 Document Revised: 03/29/2016 Document Reviewed: 04/08/2015 Elsevier Interactive Patient Education  [employer].

## 2018-05-21 NOTE — Progress Notes (Signed)
Jonathan Yu is a 67 y.o. male who presents to [facility] today for skin lesion left face.  Jonathan Yu was originally scheduled today for a testosterone injection nurse visit.  He does note however a skin lesion on the left side of his face.  He has a history of squamous cell carcinoma in situ on his chest and is worried that the skin lesion on his face may be a precancerous or cancerous lesion.  He notes it has been there for several weeks and is scaly and somewhat crusted.  He notes it does not itch and is not painful.  No fevers or chills.  No treatment tried yet.   ROS as above:  Exam:  BP 134/85   Pulse 72   Wt 268 lb (121.6 kg)   BMI 43.26 kg/m   Wt Readings from Last 5 Encounters:  05/21/18 268 lb (121.6 kg)  04/30/18 270 lb (122.5 kg)  02/26/18 264 lb (119.7 kg)  02/05/18 261 lb (118.4 kg)  01/14/18 259 lb (117.5 kg)    Gen: Well NAD HEENT: EOMI,  MMM Lungs: Normal work of breathing. CTABL Heart: RRR no MRG Abd: NABS, Soft. Nondistended, Nontender Exts: Brisk capillary refill, warm and well perfused.  Skin: Small less than 1 cm macular somewhat scaly erythematous lesion left face just anterior to ear.  Liquid nitrogen cryotherapy ablation: Consent obtained and timeout performed. Skin lesion treated with liquid nitrogen jet to achieve Lois Huxley for 10 seconds x 3 Patient tolerated the procedure well with no issues.    Assessment and Plan: 67 y.o. male with skin lesion left side of face consistent in appearance with actinic keratosis.  Treated with liquid nitrogen.  If returning next step would be excisional biopsy or shave biopsy.    No orders of the defined types were placed in this encounter.  Meds ordered this encounter  Medications  . testosterone cypionate (DEPOTESTOSTERONE CYPIONATE) injection 200 mg  . cetirizine (ZYRTEC) 10 MG tablet   Sig: Take 1 tablet (10 mg total) by mouth daily.    Dispense:  90 tablet    Refill:  3     Historical information moved to improve visibility of documentation.  Past Medical History:  Diagnosis Date  . Allergy   . DDD (degenerative disc disease), lumbar   . Hyperlipidemia    "no meds since losing 106# 2 yr ago" (10/06/2013)  . Hypertension 2012   "no meds since losing 106# 2 yr ago" (10/06/2013)  . OSA on CPAP    "don't wear mask much since losing 106# 2 yr ago" (10/06/2013)  . Type II diabetes mellitus ([place])    "no meds since losing 106# 2 yr ago" (10/06/2013)  . Walking pneumonia 1968   Past Surgical History:  Procedure Laterality Date  . ANKLE FRACTURE SURGERY Left 1973  . ANKLE RECONSTRUCTION Right 05/2011  . CARPAL TUNNEL RELEASE Bilateral 2001  . HARDWARE REMOVAL Right 10/07/2013   Procedure: RIGHT ANKLE HARDWARE REMOVAL;  Surgeon: Newt Minion, MD;  Location: [place];  Service: Orthopedics;  Laterality: Right;  . I&D EXTREMITY Right 11/18/2013   Procedure: IRRIGATION AND DEBRIDEMENT EXTREMITY;  Surgeon: Newt Minion, MD;  Location: [place];  Service: Orthopedics;  Laterality: Right;  Irrigation and Debridement Right Fibula , Place Antibiotic Beads and VAC  . NASAL SEPTUM SURGERY  1982  . ORIF ANKLE FRACTURE Right 09/08/2013   Procedure: REPAIR SYNDESMOSIS DISRUPTION RIGHT ANKLE;  Surgeon:  Newt Minion, MD;  Location: [place];  Service: Orthopedics;  Laterality: Right;  . SHOULDER ARTHROSCOPY W/ ROTATOR CUFF REPAIR Right 2006; 2009  . TONSILLECTOMY  1959  . TOTAL ANKLE ARTHROPLASTY Right 08/18/2013   Procedure: TOTAL ANKLE ARTHOPLASTY;  Surgeon: Newt Minion, MD;  Location: [place];  Service: Orthopedics;  Laterality: Right;  Right Total Ankle Arthroplasty, Revision Fibular Fracture  . URETERAL STENT PLACEMENT  ~ 2010 X 2   Social History   Tobacco Use  . Smoking status: Current Some Day Smoker    Years: 6.00    Types: Pipe, Cigars  . Smokeless tobacco: Never Used  . Tobacco  comment: 10/06/2013 "use pipe or cigar 1-2 times/month"  Substance Use Topics  . Alcohol use: Yes    Comment: 10/06/2013 "have 4-5 drinks/yr"   family history includes Alzheimer's disease in his father; Cancer (age of onset: 75) in his mother; Cleft lip in his sister; Parkinson's disease in his father; Scoliosis in his sister.  Medications: Current Outpatient Medications  Medication Sig Dispense Refill  . albuterol (PROVENTIL HFA;VENTOLIN HFA) 108 (90 Base) MCG/ACT inhaler Inhale 2 puffs into the lungs every 6 (six) hours as needed for wheezing or shortness of breath. 1 Inhaler 0  . atorvastatin (LIPITOR) 40 MG tablet Take 1 tablet (40 mg total) by mouth daily. 90 tablet 3  . cetirizine (ZYRTEC) 10 MG tablet Take 1 tablet (10 mg total) by mouth daily. 90 tablet 3  . ibuprofen (ADVIL,MOTRIN) 200 MG tablet Take 200 mg by mouth every 6 (six) hours as needed.    . loratadine (CLARITIN) 10 MG tablet Take 10 mg by mouth daily.    Marland Kitchen losartan (COZAAR) 50 MG tablet TAKE 1 TABLET BY MOUTH ONCE DAILY 90 tablet 0  . metroNIDAZOLE (METROGEL) 1 % gel Apply daily topically. 60 g 12  . Misc. Devices (FLEX THERAPY) MISC by Does not apply route.    . Multiple Vitamin (MULTIVITAMIN WITH MINERALS) TABS tablet Take 1 tablet by mouth daily.    . naproxen sodium (ALEVE) 220 MG tablet Take 220 mg by mouth 2 (two) times daily with a meal.    . Omega-3 Fatty Acids (FISH OIL PO) Take 1 capsule by mouth daily.    Marland Kitchen OVER THE COUNTER MEDICATION Take 1 tablet by mouth 4 (four) times daily - after meals and at bedtime. -pH balance formula Alkolete Yoli    . tadalafil (ADCIRCA/CIALIS) 20 MG tablet Take 0.5-1 tablets (10-20 mg total) by mouth every other day as needed for erectile dysfunction. 30 tablet 11   No current facility-administered medications for this visit.    Allergies  Allergen Reactions  . Sesame Oil Hives and Shortness Of Breath  . Topamax [Topiramate]     Cognitive decline  . Percocet  [Oxycodone-Acetaminophen] Rash  . Vicodin [Hydrocodone-Acetaminophen] Rash     Discussed warning signs or symptoms. Please see discharge instructions. Patient expresses understanding.

## 2018-05-21 NOTE — Progress Notes (Signed)
Jonathan Yu is here for a testosterone injection.  Denies palpitation, SOB, and mood changes.  Pt tolerated injection well in the RUOQ without any complications.  Pt advised to make next appointment. -EH/RMA

## 2018-06-11 ENCOUNTER — Ambulatory Visit: Payer: Medicare PPO

## 2018-07-02 ENCOUNTER — Ambulatory Visit (INDEPENDENT_AMBULATORY_CARE_PROVIDER_SITE_OTHER): Payer: Medicare PPO | Admitting: Family Medicine

## 2018-07-02 VITALS — BP 137/81 | HR 61 | Ht 66.0 in | Wt 265.0 lb

## 2018-07-02 DIAGNOSIS — E291 Testicular hypofunction: Secondary | ICD-10-CM

## 2018-07-02 MED ORDER — TESTOSTERONE CYPIONATE 200 MG/ML IM SOLN
200.0000 mg | Freq: Once | INTRAMUSCULAR | Status: AC
  Administered 2018-07-02: 200 mg via INTRAMUSCULAR

## 2018-07-02 NOTE — Progress Notes (Signed)
Patient is here today for his testosterone shot. Patient denies chest pain, shortness of breath, headaches, problems with medication, or mood changes. Patient tolerated injection well in the LUOQ. Patient states he has already made his next appointment for his testosterone shot.   Patient states he will also need a refill on his blood pressure medication coming up soon. Note will be routed to provider. No further questions or concerns at this time. Casilda Carls, CMA.

## 2018-07-08 ENCOUNTER — Other Ambulatory Visit: Payer: Self-pay | Admitting: Family Medicine

## 2018-07-23 ENCOUNTER — Ambulatory Visit (INDEPENDENT_AMBULATORY_CARE_PROVIDER_SITE_OTHER): Payer: Medicare PPO | Admitting: Family Medicine

## 2018-07-23 VITALS — BP 129/78 | HR 91 | Temp 97.6°F | Wt 273.0 lb

## 2018-07-23 DIAGNOSIS — E291 Testicular hypofunction: Secondary | ICD-10-CM | POA: Diagnosis not present

## 2018-07-23 DIAGNOSIS — R05 Cough: Secondary | ICD-10-CM

## 2018-07-23 DIAGNOSIS — R059 Cough, unspecified: Secondary | ICD-10-CM

## 2018-07-23 MED ORDER — BENZONATATE 200 MG PO CAPS: 200.0000 mg | ORAL_CAPSULE | Freq: Three times a day (TID) | ORAL | 0 refills | Status: DC | PRN

## 2018-07-23 MED ORDER — TESTOSTERONE CYPIONATE 200 MG/ML IM SOLN
200.0000 mg | Freq: Once | INTRAMUSCULAR | Status: AC
  Administered 2018-07-23: 200 mg via INTRAMUSCULAR

## 2018-07-23 NOTE — Progress Notes (Signed)
Will refill Tessalon Perles.  Recheck in the near future.  Patient also due for pneumococcal 23 vaccine.  We will try to administer at the next visit.

## 2018-07-23 NOTE — Progress Notes (Signed)
Pt in today for testosterone injection. Medications and allergies reviewed. Pt denies chest pain, shortness of breath, headaches and denies problems with medication or mood changes. Pt received 200 mg of testosterone in LUOQ . Patient tolerated injection well without complications. Pt advised to schedule next injection in 21 days. Pt has also been complaining of on and off congestion for abouts 4 weeks. 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 Advised patient I would forward to provider for review.

## 2018-08-14 ENCOUNTER — Ambulatory Visit (INDEPENDENT_AMBULATORY_CARE_PROVIDER_SITE_OTHER): Payer: Medicare PPO | Admitting: Family Medicine

## 2018-08-14 VITALS — BP 137/78 | HR 64 | Temp 97.4°F | Wt 275.0 lb

## 2018-08-14 DIAGNOSIS — Z23 Encounter for immunization: Secondary | ICD-10-CM

## 2018-08-14 DIAGNOSIS — E291 Testicular hypofunction: Secondary | ICD-10-CM

## 2018-08-14 MED ORDER — TESTOSTERONE CYPIONATE 200 MG/ML IM SOLN
200.0000 mg | Freq: Once | INTRAMUSCULAR | Status: AC
  Administered 2018-08-14: 200 mg via INTRAMUSCULAR

## 2018-08-14 NOTE — Progress Notes (Signed)
Pt in today for testosterone injection. Medications and allergies reviewed. Pt denies chest pain, shortness of breath, headaches and denies problems with medication or mood changes. Pt received testosterone inRUOQ . Patient tolerated injection well without complications. Pt advised to schedule next injection. Pt was also due for Prevnar 23, Pt was given prevnar 23 in left deltoid. Pt  Requested a refill on his appetite suppressant he use to take . Advised patient I would send request to provider for review.

## 2018-09-02 ENCOUNTER — Telehealth: Payer: Self-pay | Admitting: Family Medicine

## 2018-09-02 ENCOUNTER — Ambulatory Visit (INDEPENDENT_AMBULATORY_CARE_PROVIDER_SITE_OTHER): Payer: Medicare PPO | Admitting: Physician Assistant

## 2018-09-02 VITALS — BP 128/73 | HR 62 | Temp 98.6°F | Wt 259.0 lb

## 2018-09-02 DIAGNOSIS — E291 Testicular hypofunction: Secondary | ICD-10-CM

## 2018-09-02 MED ORDER — TESTOSTERONE CYPIONATE 200 MG/ML IM SOLN
200.0000 mg | Freq: Once | INTRAMUSCULAR | Status: AC
  Administered 2018-09-02: 200 mg via INTRAMUSCULAR

## 2018-09-02 NOTE — Telephone Encounter (Signed)
Pt is currently on a diet plan and is doing well has lost 16 lbs, pt is struggling with appetite and is requesting an appetite suppressant. Pt states he has used short term before and worked well.

## 2018-09-02 NOTE — Progress Notes (Signed)
Pt in today for testosterone injection. Medications and allergies reviewed. Pt denies chest pain, shortness of breath, headaches and denies problems with medication or mood changes. Pt received testosterone in [place] . Patient tolerated injection well without complications. Pt advised to schedule next injection.   Agree with above plan. Iran Planas PA-C

## 2018-09-03 ENCOUNTER — Ambulatory Visit: Payer: Medicare PPO

## 2018-09-04 NOTE — Telephone Encounter (Signed)
Due to the risks of some diet medications and the complexity of dosing and other health concerns a face-to-face visit with me will be needed to prescribe any medication like that.  Please schedule an appointment with me.

## 2018-09-04 NOTE — Telephone Encounter (Signed)
Pt advised. He will call back to schedule.  

## 2018-09-14 DIAGNOSIS — M545 Low back pain: Secondary | ICD-10-CM | POA: Diagnosis not present

## 2018-09-14 DIAGNOSIS — N50811 Right testicular pain: Secondary | ICD-10-CM | POA: Diagnosis not present

## 2018-09-17 ENCOUNTER — Telehealth: Payer: Self-pay | Admitting: Family Medicine

## 2018-09-17 NOTE — Telephone Encounter (Signed)
Received note from [facility].  This is regarding kidney stones.  He has had kidney stones in the past and was seen by urology.  Urology suspected pain mostly related to neuromuscular or neurologic in origin and likely not due to kidney stone..  Plan to perform noncontrast CT scan.  Date of service 09/14/2018

## 2018-09-24 ENCOUNTER — Ambulatory Visit (INDEPENDENT_AMBULATORY_CARE_PROVIDER_SITE_OTHER): Payer: Medicare PPO | Admitting: Family Medicine

## 2018-09-24 VITALS — BP 133/75 | HR 62 | Wt 261.0 lb

## 2018-09-24 DIAGNOSIS — E291 Testicular hypofunction: Secondary | ICD-10-CM | POA: Diagnosis not present

## 2018-09-24 MED ORDER — TESTOSTERONE CYPIONATE 200 MG/ML IM SOLN
200.0000 mg | Freq: Once | INTRAMUSCULAR | Status: AC
  Administered 2018-09-24: 200 mg via INTRAMUSCULAR

## 2018-09-24 NOTE — Progress Notes (Signed)
Acute Office Visit  Subjective:    Patient ID: Jonathan Yu, male    DOB: 25-Aug-1950, 68 y.o.   MRN: [id number]  Chief Complaint  Patient presents with  . Hypogonadism    HPI Jonathan Yu is here for a testosterone injection. Denies chest pain, shortness of breath, headaches or mood changes.   Past Medical History:  Diagnosis Date  . Allergy   . DDD (degenerative disc disease), lumbar   . Hyperlipidemia    "no meds since losing 106# 2 yr ago" (10/06/2013)  . Hypertension 2012   "no meds since losing 106# 2 yr ago" (10/06/2013)  . OSA on CPAP    "don't wear mask much since losing 106# 2 yr ago" (10/06/2013)  . Type II diabetes mellitus ([place])    "no meds since losing 106# 2 yr ago" (10/06/2013)  . Walking pneumonia 1968    Past Surgical History:  Procedure Laterality Date  . ANKLE FRACTURE SURGERY Left 1973  . ANKLE RECONSTRUCTION Right 05/2011  . CARPAL TUNNEL RELEASE Bilateral 2001  . HARDWARE REMOVAL Right 10/07/2013   Procedure: RIGHT ANKLE HARDWARE REMOVAL;  Surgeon: Newt Minion, MD;  Location: [place];  Service: Orthopedics;  Laterality: Right;  . I&D EXTREMITY Right 11/18/2013   Procedure: IRRIGATION AND DEBRIDEMENT EXTREMITY;  Surgeon: Newt Minion, MD;  Location: [place];  Service: Orthopedics;  Laterality: Right;  Irrigation and Debridement Right Fibula , Place Antibiotic Beads and VAC  . NASAL SEPTUM SURGERY  1982  . ORIF ANKLE FRACTURE Right 09/08/2013   Procedure: REPAIR SYNDESMOSIS DISRUPTION RIGHT ANKLE;  Surgeon: Newt Minion, MD;  Location: [place];  Service: Orthopedics;  Laterality: Right;  . SHOULDER ARTHROSCOPY W/ ROTATOR CUFF REPAIR Right 2006; 2009  . TONSILLECTOMY  1959  . TOTAL ANKLE ARTHROPLASTY Right 08/18/2013   Procedure: TOTAL ANKLE ARTHOPLASTY;  Surgeon: Newt Minion, MD;  Location: [facility];  Service: Orthopedics;  Laterality: Right;  Right Total Ankle Arthroplasty, Revision Fibular Fracture  . URETERAL STENT PLACEMENT  ~ 2010 X 2    Family  History  Problem Relation Age of Onset  . Cancer Mother 19       Lung  . Alzheimer's disease Father   . Parkinson's disease Father   . Scoliosis Sister   . Cleft lip Sister     Social History   Socioeconomic History  . Marital status: Divorced    Spouse name: Not on file  . Number of children: 5  . Years of education: Not on file  . Highest education level: Not on file  Occupational History  . Occupation: sales/ DJ  Social Needs  . Financial resource strain: Not on file  . Food insecurity:    Worry: Not on file    Inability: Not on file  . Transportation needs:    Medical: Not on file    Non-medical: Not on file  Tobacco Use  . Smoking status: Current Some Day Smoker    Years: 6.00    Types: Pipe, Cigars  . Smokeless tobacco: Never Used  . Tobacco comment: 10/06/2013 "use pipe or cigar 1-2 times/month"  Substance and Sexual Activity  . Alcohol use: Yes    Comment: 10/06/2013 "have 4-5 drinks/yr"  . Drug use: No  . Sexual activity: Yes    Comment: 1 every 2 months  Lifestyle  . Physical activity:    Days per week: Not on file    Minutes per session: Not on file  . Stress: Not  on file  Relationships  . Social connections:    Talks on phone: Not on file    Gets together: Not on file    Attends religious service: Not on file    Active member of club or organization: Not on file    Attends meetings of clubs or organizations: Not on file    Relationship status: Not on file  . Intimate partner violence:    Fear of current or ex partner: Not on file    Emotionally abused: Not on file    Physically abused: Not on file    Forced sexual activity: Not on file  Other Topics Concern  . Not on file  Social History Narrative  . Not on file    Outpatient Medications Prior to Visit  Medication Sig Dispense Refill  . albuterol (PROVENTIL HFA;VENTOLIN HFA) 108 (90 Base) MCG/ACT inhaler Inhale 2 puffs into the lungs every 6 (six) hours as needed for wheezing or shortness of  breath. 1 Inhaler 0  . atorvastatin (LIPITOR) 40 MG tablet Take 1 tablet (40 mg total) by mouth daily. 90 tablet 3  . benzonatate (TESSALON) 200 MG capsule Take 1 capsule (200 mg total) by mouth 3 (three) times daily as needed for cough. 45 capsule 0  . cetirizine (ZYRTEC) 10 MG tablet Take 1 tablet (10 mg total) by mouth daily. 90 tablet 3  . ibuprofen (ADVIL,MOTRIN) 200 MG tablet Take 200 mg by mouth every 6 (six) hours as needed.    . loratadine (CLARITIN) 10 MG tablet Take 10 mg by mouth daily.    Marland Kitchen losartan (COZAAR) 50 MG tablet TAKE 1 TABLET BY MOUTH ONCE DAILY 90 tablet 0  . Misc. Devices (FLEX THERAPY) MISC by Does not apply route.    . Multiple Vitamin (MULTIVITAMIN WITH MINERALS) TABS tablet Take 1 tablet by mouth daily.    . naproxen sodium (ALEVE) 220 MG tablet Take 220 mg by mouth 2 (two) times daily with a meal.    . Omega-3 Fatty Acids (FISH OIL PO) Take 1 capsule by mouth daily.    Marland Kitchen OVER THE COUNTER MEDICATION Take 1 tablet by mouth 4 (four) times daily - after meals and at bedtime. -pH balance formula Alkolete Yoli    . tadalafil (ADCIRCA/CIALIS) 20 MG tablet Take 0.5-1 tablets (10-20 mg total) by mouth every other day as needed for erectile dysfunction. 30 tablet 11  . metroNIDAZOLE (METROGEL) 1 % gel Apply daily topically. 60 g 12   Facility-Administered Medications Prior to Visit  Medication Dose Route Frequency Provider Last Rate Last Dose  . testosterone cypionate (DEPOTESTOSTERONE CYPIONATE) injection 200 mg  200 mg Intramuscular Once Breeback, Jade L, PA-C        Allergies  Allergen Reactions  . Sesame Oil Hives and Shortness Of Breath  . Topamax [Topiramate]     Cognitive decline  . Percocet [Oxycodone-Acetaminophen] Rash  . Vicodin [Hydrocodone-Acetaminophen] Rash    ROS     Objective:    Physical Exam  BP 133/75   Pulse 62   Wt 261 lb (118.4 kg)   SpO2 95%   BMI 42.13 kg/m  Wt Readings from Last 3 Encounters:  09/24/18 261 lb (118.4 kg)   09/02/18 259 lb (117.5 kg)  08/14/18 275 lb (124.7 kg)    There are no preventive care reminders to display for this patient.  There are no preventive care reminders to display for this patient.   Lab Results  Component Value Date   TSH 2.31  07/01/2017   Lab Results  Component Value Date   WBC 6.7 05/11/2018   HGB 16.7 05/11/2018   HCT 49.1 05/11/2018   MCV 88.0 05/11/2018   PLT 224 05/11/2018   Lab Results  Component Value Date   NA 138 05/11/2018   K 4.5 05/11/2018   CO2 29 05/11/2018   GLUCOSE 116 (H) 05/11/2018   BUN 14 05/11/2018   CREATININE 0.96 05/11/2018   BILITOT 2.2 (H) 05/11/2018   ALKPHOS 39 (L) 01/07/2017   AST 20 05/11/2018   ALT 29 05/11/2018   PROT 6.5 05/11/2018   ALBUMIN 3.5 (L) 01/07/2017   CALCIUM 9.3 05/11/2018   Lab Results  Component Value Date   CHOL 200 (H) 05/11/2018   Lab Results  Component Value Date   HDL 37 (L) 05/11/2018   Lab Results  Component Value Date   LDLCALC 129 (H) 05/11/2018   Lab Results  Component Value Date   TRIG 200 (H) 05/11/2018   Lab Results  Component Value Date   CHOLHDL 5.4 (H) 05/11/2018   Lab Results  Component Value Date   HGBA1C 5.8 (H) 05/11/2018       Assessment & Plan:  Low testosterone-Patient tolerated injection well without complications. Patient advised to schedule next injection 21 days from today.   Problem List Items Addressed This Visit    Hypogonadism in male - Primary   Relevant Medications   testosterone cypionate (DEPOTESTOSTERONE CYPIONATE) injection 200 mg (Start on 09/24/2018 10:45 AM)       Meds ordered this encounter  Medications  . testosterone cypionate (DEPOTESTOSTERONE CYPIONATE) injection 200 mg     Lavell Luster, CMA

## 2018-10-10 ENCOUNTER — Other Ambulatory Visit: Payer: Self-pay | Admitting: Family Medicine

## 2018-10-15 ENCOUNTER — Ambulatory Visit (INDEPENDENT_AMBULATORY_CARE_PROVIDER_SITE_OTHER): Payer: Medicare PPO | Admitting: Family Medicine

## 2018-10-15 VITALS — BP 121/75 | HR 60 | Wt 255.0 lb

## 2018-10-15 DIAGNOSIS — E291 Testicular hypofunction: Secondary | ICD-10-CM

## 2018-10-15 MED ORDER — TESTOSTERONE CYPIONATE 200 MG/ML IM SOLN
200.0000 mg | Freq: Once | INTRAMUSCULAR | Status: AC
  Administered 2018-10-15: 200 mg via INTRAMUSCULAR

## 2018-10-15 NOTE — Progress Notes (Signed)
Established Patient Office Visit  Subjective:  Patient ID: Jonathan Yu, male    DOB: 1951/08/09  Age: 68 y.o. MRN: [id number]  CC:  Chief Complaint  Patient presents with  . Hypogonadism    HPI Jonathan Yu is here for a testosterone injection. Denies chest pain, shortness of breath, headaches or mood changes.    Past Medical History:  Diagnosis Date  . Allergy   . DDD (degenerative disc disease), lumbar   . Hyperlipidemia    "no meds since losing 106# 2 yr ago" (10/06/2013)  . Hypertension 2012   "no meds since losing 106# 2 yr ago" (10/06/2013)  . OSA on CPAP    "don't wear mask much since losing 106# 2 yr ago" (10/06/2013)  . Type II diabetes mellitus ([place])    "no meds since losing 106# 2 yr ago" (10/06/2013)  . Walking pneumonia 1968    Past Surgical History:  Procedure Laterality Date  . ANKLE FRACTURE SURGERY Left 1973  . ANKLE RECONSTRUCTION Right 05/2011  . CARPAL TUNNEL RELEASE Bilateral 2001  . HARDWARE REMOVAL Right 10/07/2013   Procedure: RIGHT ANKLE HARDWARE REMOVAL;  Surgeon: Newt Minion, MD;  Location: [place];  Service: Orthopedics;  Laterality: Right;  . I&D EXTREMITY Right 11/18/2013   Procedure: IRRIGATION AND DEBRIDEMENT EXTREMITY;  Surgeon: Newt Minion, MD;  Location: [place];  Service: Orthopedics;  Laterality: Right;  Irrigation and Debridement Right Fibula , Place Antibiotic Beads and VAC  . NASAL SEPTUM SURGERY  1982  . ORIF ANKLE FRACTURE Right 09/08/2013   Procedure: REPAIR SYNDESMOSIS DISRUPTION RIGHT ANKLE;  Surgeon: Newt Minion, MD;  Location: [place];  Service: Orthopedics;  Laterality: Right;  . SHOULDER ARTHROSCOPY W/ ROTATOR CUFF REPAIR Right 2006; 2009  . TONSILLECTOMY  1959  . TOTAL ANKLE ARTHROPLASTY Right 08/18/2013   Procedure: TOTAL ANKLE ARTHOPLASTY;  Surgeon: Newt Minion, MD;  Location: [place];  Service: Orthopedics;  Laterality: Right;  Right Total Ankle Arthroplasty, Revision Fibular Fracture  . URETERAL STENT PLACEMENT  ~ 2010  X 2    Family History  Problem Relation Age of Onset  . Cancer Mother 17       Lung  . Alzheimer's disease Father   . Parkinson's disease Father   . Scoliosis Sister   . Cleft lip Sister     Social History   Socioeconomic History  . Marital status: Divorced    Spouse name: Not on file  . Number of children: 5  . Years of education: Not on file  . Highest education level: Not on file  Occupational History  . Occupation: sales/ DJ  Social Needs  . Financial resource strain: Not on file  . Food insecurity:    Worry: Not on file    Inability: Not on file  . Transportation needs:    Medical: Not on file    Non-medical: Not on file  Tobacco Use  . Smoking status: Current Some Day Smoker    Years: 6.00    Types: Pipe, Cigars  . Smokeless tobacco: Never Used  . Tobacco comment: 10/06/2013 "use pipe or cigar 1-2 times/month"  Substance and Sexual Activity  . Alcohol use: Yes    Comment: 10/06/2013 "have 4-5 drinks/yr"  . Drug use: No  . Sexual activity: Yes    Comment: 1 every 2 months  Lifestyle  . Physical activity:    Days per week: Not on file    Minutes per session: Not on file  .  Stress: Not on file  Relationships  . Social connections:    Talks on phone: Not on file    Gets together: Not on file    Attends religious service: Not on file    Active member of club or organization: Not on file    Attends meetings of clubs or organizations: Not on file    Relationship status: Not on file  . Intimate partner violence:    Fear of current or ex partner: Not on file    Emotionally abused: Not on file    Physically abused: Not on file    Forced sexual activity: Not on file  Other Topics Concern  . Not on file  Social History Narrative  . Not on file    Outpatient Medications Prior to Visit  Medication Sig Dispense Refill  . albuterol (PROVENTIL HFA;VENTOLIN HFA) 108 (90 Base) MCG/ACT inhaler Inhale 2 puffs into the lungs every 6 (six) hours as needed for  wheezing or shortness of breath. 1 Inhaler 0  . atorvastatin (LIPITOR) 40 MG tablet Take 1 tablet (40 mg total) by mouth daily. 90 tablet 3  . cetirizine (ZYRTEC) 10 MG tablet Take 1 tablet (10 mg total) by mouth daily. 90 tablet 3  . ibuprofen (ADVIL,MOTRIN) 200 MG tablet Take 200 mg by mouth every 6 (six) hours as needed.    . loratadine (CLARITIN) 10 MG tablet Take 10 mg by mouth daily.    Marland Kitchen losartan (COZAAR) 50 MG tablet Take 1 tablet by mouth once daily 90 tablet 0  . Misc. Devices (FLEX THERAPY) MISC by Does not apply route.    . Multiple Vitamin (MULTIVITAMIN WITH MINERALS) TABS tablet Take 1 tablet by mouth daily.    . naproxen sodium (ALEVE) 220 MG tablet Take 220 mg by mouth 2 (two) times daily with a meal.    . Omega-3 Fatty Acids (FISH OIL PO) Take 1 capsule by mouth daily.    Marland Kitchen OVER THE COUNTER MEDICATION Take 1 tablet by mouth 4 (four) times daily - after meals and at bedtime. -pH balance formula Alkolete Yoli    . tadalafil (ADCIRCA/CIALIS) 20 MG tablet Take 0.5-1 tablets (10-20 mg total) by mouth every other day as needed for erectile dysfunction. 30 tablet 11  . benzonatate (TESSALON) 200 MG capsule Take 1 capsule (200 mg total) by mouth 3 (three) times daily as needed for cough. 45 capsule 0   No facility-administered medications prior to visit.     Allergies  Allergen Reactions  . Sesame Oil Hives and Shortness Of Breath  . Topamax [Topiramate]     Cognitive decline  . Percocet [Oxycodone-Acetaminophen] Rash  . Vicodin [Hydrocodone-Acetaminophen] Rash    ROS Review of Systems    Objective:    Physical Exam  BP 121/75   Pulse 60   Wt 255 lb (115.7 kg)   SpO2 95%   BMI 41.16 kg/m  Wt Readings from Last 3 Encounters:  10/15/18 255 lb (115.7 kg)  09/24/18 261 lb (118.4 kg)  09/02/18 259 lb (117.5 kg)     There are no preventive care reminders to display for this patient.  There are no preventive care reminders to display for this patient.  Lab Results   Component Value Date   TSH 2.31 07/01/2017   Lab Results  Component Value Date   WBC 6.7 05/11/2018   HGB 16.7 05/11/2018   HCT 49.1 05/11/2018   MCV 88.0 05/11/2018   PLT 224 05/11/2018   Lab Results  Component Value Date   NA 138 05/11/2018   K 4.5 05/11/2018   CO2 29 05/11/2018   GLUCOSE 116 (H) 05/11/2018   BUN 14 05/11/2018   CREATININE 0.96 05/11/2018   BILITOT 2.2 (H) 05/11/2018   ALKPHOS 39 (L) 01/07/2017   AST 20 05/11/2018   ALT 29 05/11/2018   PROT 6.5 05/11/2018   ALBUMIN 3.5 (L) 01/07/2017   CALCIUM 9.3 05/11/2018   Lab Results  Component Value Date   CHOL 200 (H) 05/11/2018   Lab Results  Component Value Date   HDL 37 (L) 05/11/2018   Lab Results  Component Value Date   LDLCALC 129 (H) 05/11/2018   Lab Results  Component Value Date   TRIG 200 (H) 05/11/2018   Lab Results  Component Value Date   CHOLHDL 5.4 (H) 05/11/2018   Lab Results  Component Value Date   HGBA1C 5.8 (H) 05/11/2018      Assessment & Plan:  Patient tolerated injection well without complications. Patient advised to schedule next injection 21 days from today.     Problem List Items Addressed This Visit    Hypogonadism in male - Primary   Relevant Medications   testosterone cypionate (DEPOTESTOSTERONE CYPIONATE) injection 200 mg (Completed)      Meds ordered this encounter  Medications  . testosterone cypionate (DEPOTESTOSTERONE CYPIONATE) injection 200 mg    Follow-up: Return in about 3 weeks (around 11/05/2018) for testosterone injection. Durene Romans, Monico Blitz, [place]

## 2018-11-05 ENCOUNTER — Other Ambulatory Visit: Payer: Self-pay

## 2018-11-05 ENCOUNTER — Ambulatory Visit (INDEPENDENT_AMBULATORY_CARE_PROVIDER_SITE_OTHER): Payer: Medicare PPO | Admitting: Family Medicine

## 2018-11-05 ENCOUNTER — Ambulatory Visit: Payer: Medicare PPO

## 2018-11-05 ENCOUNTER — Telehealth (INDEPENDENT_AMBULATORY_CARE_PROVIDER_SITE_OTHER): Payer: Medicare PPO

## 2018-11-05 VITALS — BP 134/73 | HR 64

## 2018-11-05 DIAGNOSIS — E291 Testicular hypofunction: Secondary | ICD-10-CM

## 2018-11-05 DIAGNOSIS — L719 Rosacea, unspecified: Secondary | ICD-10-CM

## 2018-11-05 MED ORDER — TESTOSTERONE CYPIONATE 200 MG/ML IM SOLN
200.0000 mg | Freq: Once | INTRAMUSCULAR | Status: AC
  Administered 2018-11-05: 200 mg via INTRAMUSCULAR

## 2018-11-05 MED ORDER — "NEEDLE (DISP) 18G X 1"" MISC": 99 refills | Status: DC

## 2018-11-05 MED ORDER — "SYRINGE 21G X 1"" 12 ML MISC": 99 refills | Status: DC

## 2018-11-05 MED ORDER — TESTOSTERONE CYPIONATE 200 MG/ML IM SOLN: 200.0000 mg | INTRAMUSCULAR | 0 refills | Status: DC

## 2018-11-05 NOTE — Progress Notes (Signed)
Established Patient Office Visit  Subjective:  Patient ID: Jonathan Yu, male    DOB: Feb 03, 1951  Age: 68 y.o. MRN: [id number]  CC:  Chief Complaint  Patient presents with  . Hypogonadism    HPI  Jonathan Yu is here for a testosterone injection. Denies chest pain, shortness of breath, headaches or mood changes. He was given instructions today how to self injection testosterone for future injections.    Past Medical History:  Diagnosis Date  . Allergy   . DDD (degenerative disc disease), lumbar   . Hyperlipidemia    "no meds since losing 106# 2 yr ago" (10/06/2013)  . Hypertension 2012   "no meds since losing 106# 2 yr ago" (10/06/2013)  . OSA on CPAP    "don't wear mask much since losing 106# 2 yr ago" (10/06/2013)  . Type II diabetes mellitus ([place])    "no meds since losing 106# 2 yr ago" (10/06/2013)  . Walking pneumonia 1968    Past Surgical History:  Procedure Laterality Date  . ANKLE FRACTURE SURGERY Left 1973  . ANKLE RECONSTRUCTION Right 05/2011  . CARPAL TUNNEL RELEASE Bilateral 2001  . HARDWARE REMOVAL Right 10/07/2013   Procedure: RIGHT ANKLE HARDWARE REMOVAL;  Surgeon: Newt Minion, MD;  Location: [place];  Service: Orthopedics;  Laterality: Right;  . I&D EXTREMITY Right 11/18/2013   Procedure: IRRIGATION AND DEBRIDEMENT EXTREMITY;  Surgeon: Newt Minion, MD;  Location: [place];  Service: Orthopedics;  Laterality: Right;  Irrigation and Debridement Right Fibula , Place Antibiotic Beads and VAC  . NASAL SEPTUM SURGERY  1982  . ORIF ANKLE FRACTURE Right 09/08/2013   Procedure: REPAIR SYNDESMOSIS DISRUPTION RIGHT ANKLE;  Surgeon: Newt Minion, MD;  Location: [place];  Service: Orthopedics;  Laterality: Right;  . SHOULDER ARTHROSCOPY W/ ROTATOR CUFF REPAIR Right 2006; 2009  . TONSILLECTOMY  1959  . TOTAL ANKLE ARTHROPLASTY Right 08/18/2013   Procedure: TOTAL ANKLE ARTHOPLASTY;  Surgeon: Newt Minion, MD;  Location: [place];  Service: Orthopedics;  Laterality: Right;   Right Total Ankle Arthroplasty, Revision Fibular Fracture  . URETERAL STENT PLACEMENT  ~ 2010 X 2    Family History  Problem Relation Age of Onset  . Cancer Mother 27       Lung  . Alzheimer's disease Father   . Parkinson's disease Father   . Scoliosis Sister   . Cleft lip Sister     Social History   Socioeconomic History  . Marital status: Divorced    Spouse name: Not on file  . Number of children: 5  . Years of education: Not on file  . Highest education level: Not on file  Occupational History  . Occupation: sales/ DJ  Social Needs  . Financial resource strain: Not on file  . Food insecurity:    Worry: Not on file    Inability: Not on file  . Transportation needs:    Medical: Not on file    Non-medical: Not on file  Tobacco Use  . Smoking status: Current Some Day Smoker    Years: 6.00    Types: Pipe, Cigars  . Smokeless tobacco: Never Used  . Tobacco comment: 10/06/2013 "use pipe or cigar 1-2 times/month"  Substance and Sexual Activity  . Alcohol use: Yes    Comment: 10/06/2013 "have 4-5 drinks/yr"  . Drug use: No  . Sexual activity: Yes    Comment: 1 every 2 months  Lifestyle  . Physical activity:    Days per week:  Not on file    Minutes per session: Not on file  . Stress: Not on file  Relationships  . Social connections:    Talks on phone: Not on file    Gets together: Not on file    Attends religious service: Not on file    Active member of club or organization: Not on file    Attends meetings of clubs or organizations: Not on file    Relationship status: Not on file  . Intimate partner violence:    Fear of current or ex partner: Not on file    Emotionally abused: Not on file    Physically abused: Not on file    Forced sexual activity: Not on file  Other Topics Concern  . Not on file  Social History Narrative  . Not on file    Outpatient Medications Prior to Visit  Medication Sig Dispense Refill  . albuterol (PROVENTIL HFA;VENTOLIN HFA) 108  (90 Base) MCG/ACT inhaler Inhale 2 puffs into the lungs every 6 (six) hours as needed for wheezing or shortness of breath. 1 Inhaler 0  . atorvastatin (LIPITOR) 40 MG tablet Take 1 tablet (40 mg total) by mouth daily. 90 tablet 3  . cetirizine (ZYRTEC) 10 MG tablet Take 1 tablet (10 mg total) by mouth daily. 90 tablet 3  . ibuprofen (ADVIL,MOTRIN) 200 MG tablet Take 200 mg by mouth every 6 (six) hours as needed.    . loratadine (CLARITIN) 10 MG tablet Take 10 mg by mouth daily.    Marland Kitchen losartan (COZAAR) 50 MG tablet Take 1 tablet by mouth once daily 90 tablet 0  . Misc. Devices (FLEX THERAPY) MISC by Does not apply route.    . Multiple Vitamin (MULTIVITAMIN WITH MINERALS) TABS tablet Take 1 tablet by mouth daily.    . naproxen sodium (ALEVE) 220 MG tablet Take 220 mg by mouth 2 (two) times daily with a meal.    . Omega-3 Fatty Acids (FISH OIL PO) Take 1 capsule by mouth daily.    Marland Kitchen OVER THE COUNTER MEDICATION Take 1 tablet by mouth 4 (four) times daily - after meals and at bedtime. -pH balance formula Alkolete Yoli    . tadalafil (ADCIRCA/CIALIS) 20 MG tablet Take 0.5-1 tablets (10-20 mg total) by mouth every other day as needed for erectile dysfunction. 30 tablet 11   No facility-administered medications prior to visit.     Allergies  Allergen Reactions  . Sesame Oil Hives and Shortness Of Breath  . Topamax [Topiramate]     Cognitive decline  . Percocet [Oxycodone-Acetaminophen] Rash  . Vicodin [Hydrocodone-Acetaminophen] Rash    ROS Review of Systems    Objective:    Physical Exam  BP (!) 144/63   Pulse 64   SpO2 97%  Wt Readings from Last 3 Encounters:  10/15/18 255 lb (115.7 kg)  09/24/18 261 lb (118.4 kg)  09/02/18 259 lb (117.5 kg)     There are no preventive care reminders to display for this patient.  There are no preventive care reminders to display for this patient.  Lab Results  Component Value Date   TSH 2.31 07/01/2017   Lab Results  Component Value Date    WBC 6.7 05/11/2018   HGB 16.7 05/11/2018   HCT 49.1 05/11/2018   MCV 88.0 05/11/2018   PLT 224 05/11/2018   Lab Results  Component Value Date   NA 138 05/11/2018   K 4.5 05/11/2018   CO2 29 05/11/2018   GLUCOSE 116 (H)  05/11/2018   BUN 14 05/11/2018   CREATININE 0.96 05/11/2018   BILITOT 2.2 (H) 05/11/2018   ALKPHOS 39 (L) 01/07/2017   AST 20 05/11/2018   ALT 29 05/11/2018   PROT 6.5 05/11/2018   ALBUMIN 3.5 (L) 01/07/2017   CALCIUM 9.3 05/11/2018   Lab Results  Component Value Date   CHOL 200 (H) 05/11/2018   Lab Results  Component Value Date   HDL 37 (L) 05/11/2018   Lab Results  Component Value Date   LDLCALC 129 (H) 05/11/2018   Lab Results  Component Value Date   TRIG 200 (H) 05/11/2018   Lab Results  Component Value Date   CHOLHDL 5.4 (H) 05/11/2018   Lab Results  Component Value Date   HGBA1C 5.8 (H) 05/11/2018      Assessment & Plan:  Hypogonadism - Patient tolerated injection well without complications.    Problem List Items Addressed This Visit    Hypogonadism in male - Primary   Relevant Medications   testosterone cypionate (DEPOTESTOSTERONE CYPIONATE) injection 200 mg (Completed) (Start on 11/05/2018 11:00 AM)      Meds ordered this encounter  Medications  . testosterone cypionate (DEPOTESTOSTERONE CYPIONATE) injection 200 mg    Follow-up: No follow-ups on file.    Lavell Luster, [place]

## 2018-11-05 NOTE — Telephone Encounter (Signed)
Jonathan Yu is requesting a different medication for his Rosacea. The cream is not working as well as it did in the past. Please advise.

## 2018-11-05 NOTE — Telephone Encounter (Signed)
Metrogel gel 1 % daily

## 2018-11-05 NOTE — Progress Notes (Signed)
Agree with documentation as above.  Prescriptions sent to pharmacy for self administration at home.  He can certainly call us if he has any questions or concerns and will still need routine follow-up blood work.  Beatrice Lecher, MD

## 2018-11-05 NOTE — Telephone Encounter (Signed)
Which medicine is he currently using? Please provide specifics.

## 2018-11-06 MED ORDER — AZELAIC ACID 15 % EX GEL: 1.0000 "application " | Freq: Two times a day (BID) | CUTANEOUS | 12 refills | Status: DC

## 2018-11-06 NOTE — Telephone Encounter (Signed)
Pt advised. States he is going to [employer] for [employer].

## 2018-11-06 NOTE — Telephone Encounter (Signed)
We will switch from metronidazole gel to azelaic acid gel.  Apply twice daily.  The cheapest place to get this medicine with a good Rx coupon is about $46 at [facility].  It is about $96 with a good Rx coupon at [facility].  If insurance does not cover this well please let me know and I can send a new prescription to [facility].  Additionally please send my chart administration access code via text message to patient so he can get signed up for my chart.  That will make it easier to communicate with the patient.   Total time spent 5 minutes.

## 2019-01-10 ENCOUNTER — Other Ambulatory Visit: Payer: Self-pay | Admitting: Family Medicine

## 2019-01-28 ENCOUNTER — Ambulatory Visit (INDEPENDENT_AMBULATORY_CARE_PROVIDER_SITE_OTHER): Payer: Medicare PPO | Admitting: Family Medicine

## 2019-01-28 ENCOUNTER — Other Ambulatory Visit: Payer: Self-pay

## 2019-01-28 VITALS — BP 140/77 | HR 65 | Wt 272.0 lb

## 2019-01-28 DIAGNOSIS — E291 Testicular hypofunction: Secondary | ICD-10-CM | POA: Diagnosis not present

## 2019-01-28 MED ORDER — TESTOSTERONE CYPIONATE 200 MG/ML IM SOLN
200.0000 mg | Freq: Once | INTRAMUSCULAR | Status: AC
  Administered 2019-01-28: 200 mg via INTRAMUSCULAR

## 2019-01-28 NOTE — Progress Notes (Signed)
   Subjective:    Patient ID: Jonathan Yu, male    DOB: 04/27/1951, 68 y.o.   MRN: [id number]  HPI Patient is here for a testosterone injection. Denies chest pain, shortness of breath, headaches, problems with mood change, or medication problems.  Review of Systems     Objective:   Physical Exam        Assessment & Plan:  Patient tolerated injection in LUOQ well without complications. Patient advised to schedule his next injection for 3 weeks from today.  Pt was administering at home but states it was more expensive, will be getting injections in office now.

## 2019-02-14 IMAGING — DX DG CHEST 2V
2 series · 2 of 2 positions shown · non-contrast
Comparison: Chest x-ray of 08/13/2013

CLINICAL DATA: Cough, congestion, fatigue

EXAM:
CHEST  2 VIEW

[chest pa]
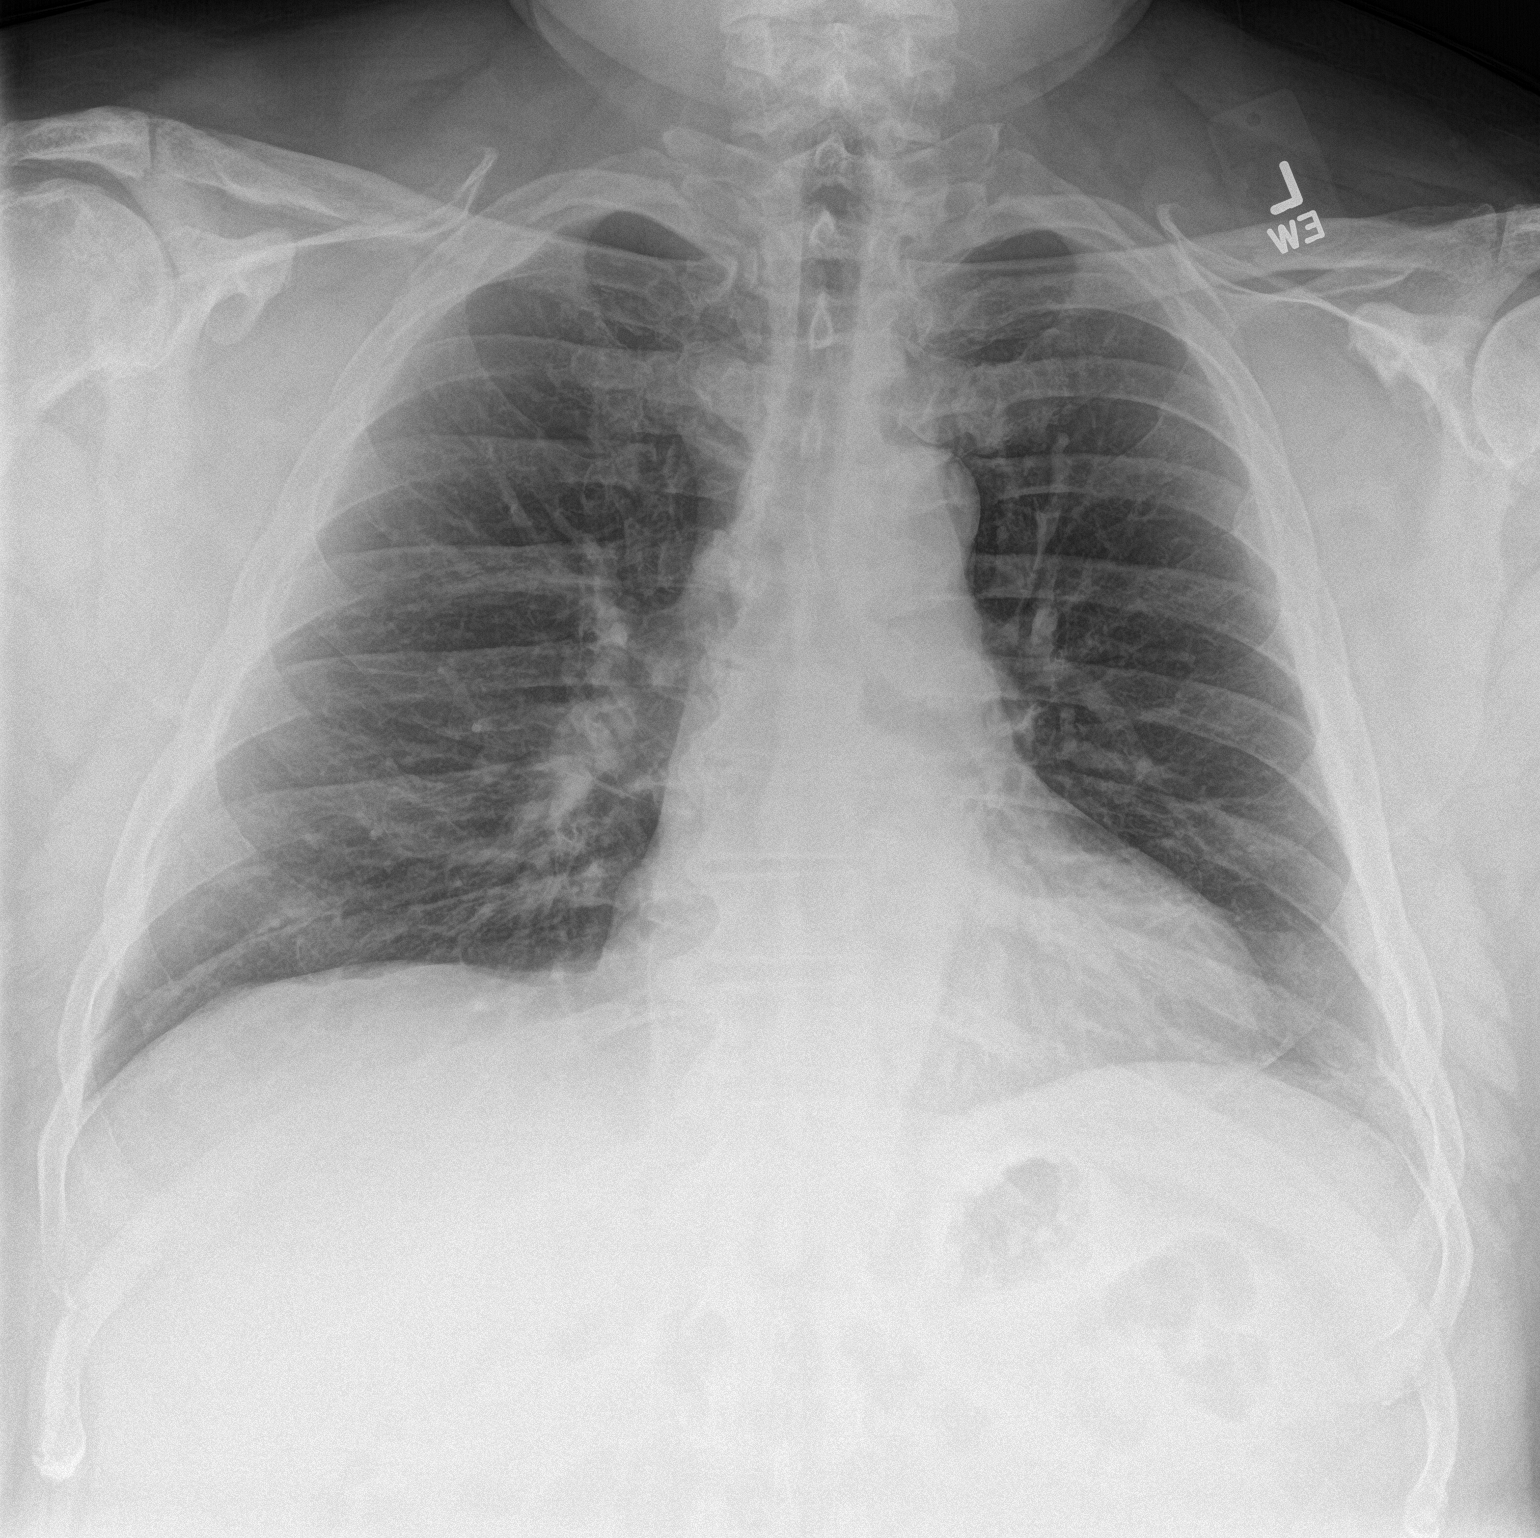

[chest lat]
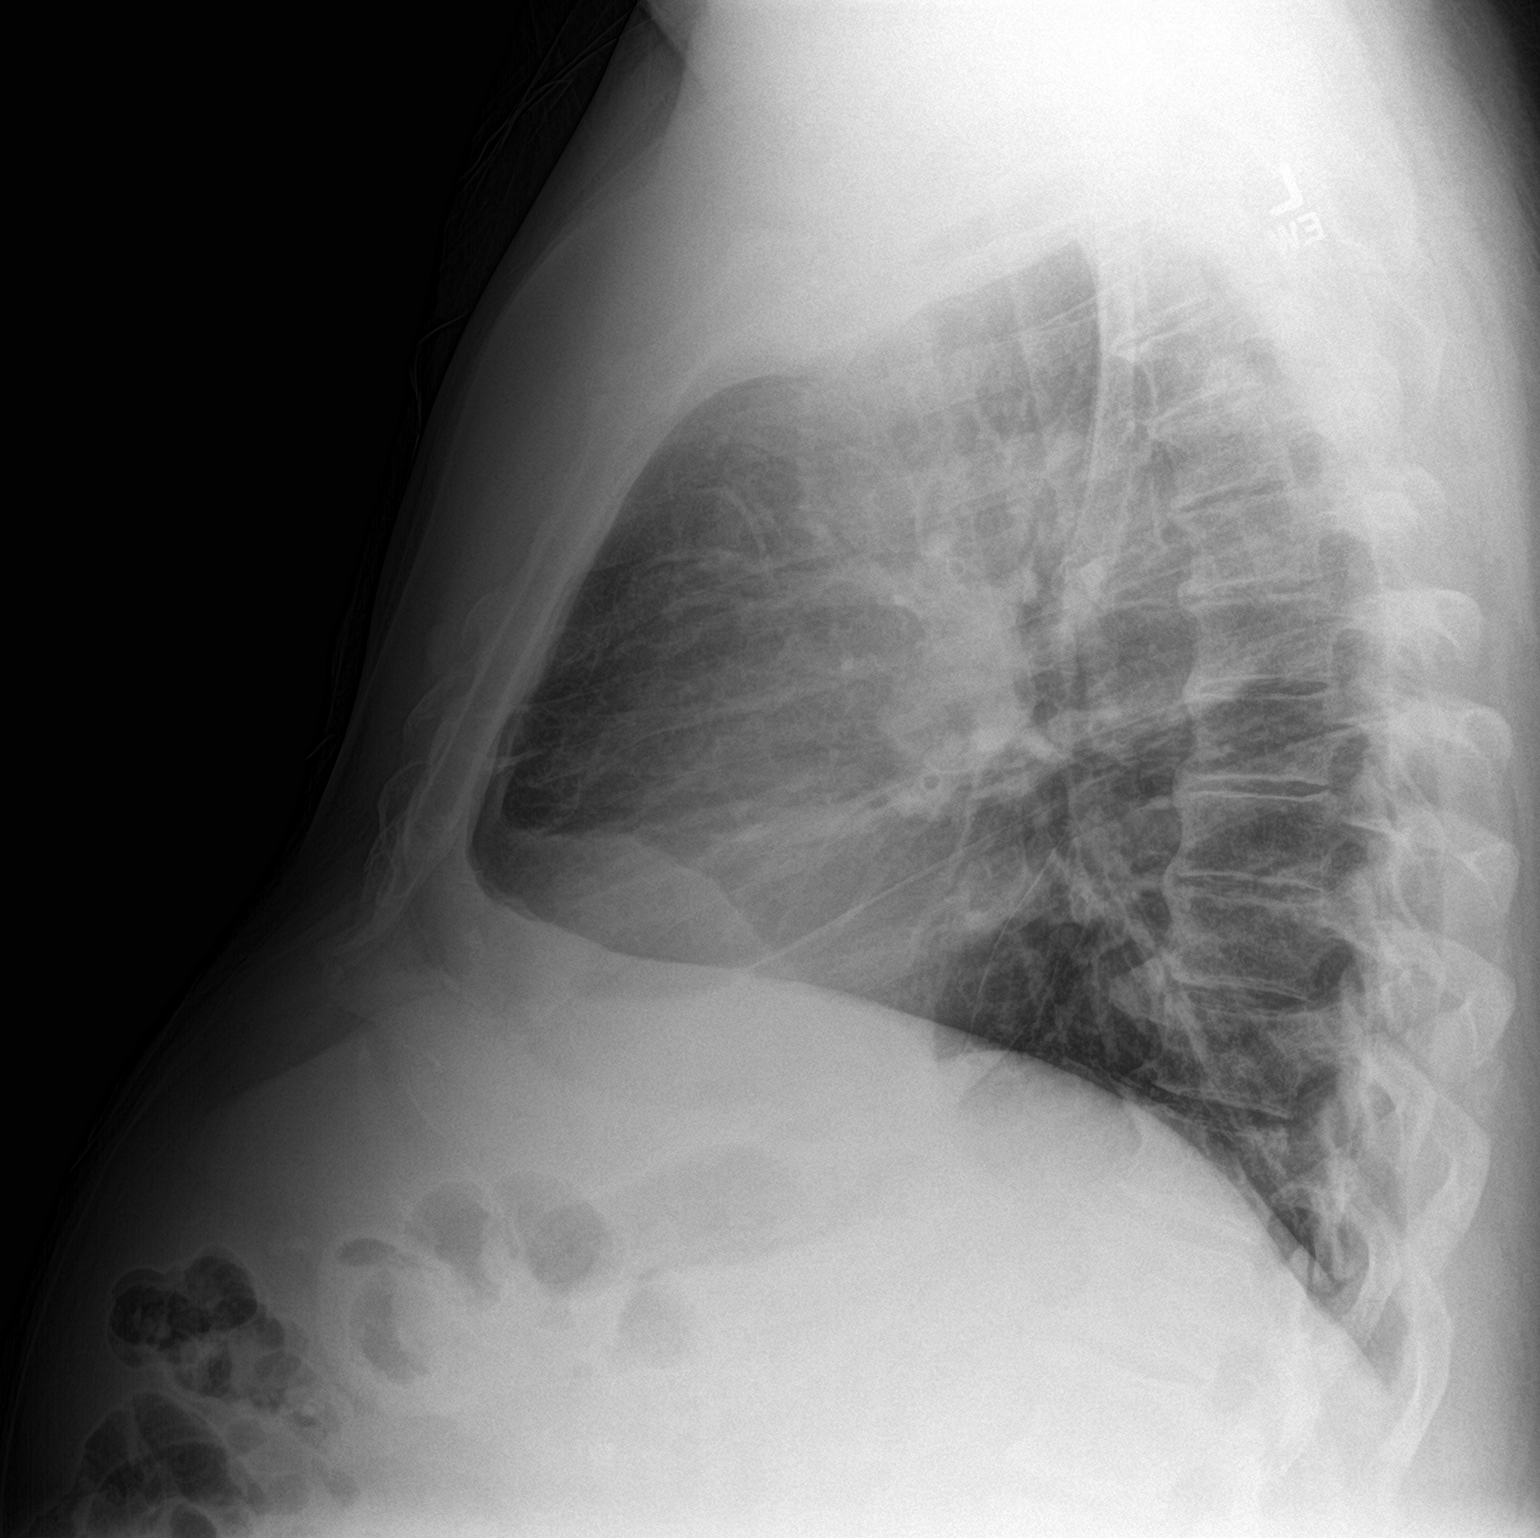

[2 of 2 positions shown; findings below may reference images not displayed]

FINDINGS: No active infiltrate or effusion is seen. Mediastinal and hilar
contours are unremarkable. The heart is borderline enlarged. There
is some peribronchial thickening which may indicate bronchitis.
There are degenerative changes throughout the thoracic spine.
IMPRESSION: 1. No pneumonia or effusion.
2. Cannot exclude bronchitis.

## 2019-02-18 ENCOUNTER — Other Ambulatory Visit: Payer: Self-pay

## 2019-02-18 ENCOUNTER — Ambulatory Visit (INDEPENDENT_AMBULATORY_CARE_PROVIDER_SITE_OTHER): Payer: Medicare PPO | Admitting: Family Medicine

## 2019-02-18 VITALS — BP 127/52 | HR 70 | Temp 98.7°F | Ht 66.0 in | Wt 271.0 lb

## 2019-02-18 DIAGNOSIS — E291 Testicular hypofunction: Secondary | ICD-10-CM | POA: Diagnosis not present

## 2019-02-18 MED ORDER — TESTOSTERONE CYPIONATE 200 MG/ML IM SOLN
200.0000 mg | Freq: Once | INTRAMUSCULAR | Status: AC
  Administered 2019-02-18: 200 mg via INTRAMUSCULAR

## 2019-02-18 NOTE — Progress Notes (Signed)
Patient presents to clinic for Testosterone injection in [place] with no immediate complications. Patient denies any chest pain, shortness of breath, headaches or mood swings associated with taking the Testosterone. Patient was instructed to make a follow up in 3 weeks for the next injection.

## 2019-03-11 ENCOUNTER — Other Ambulatory Visit: Payer: Self-pay

## 2019-03-11 ENCOUNTER — Ambulatory Visit (INDEPENDENT_AMBULATORY_CARE_PROVIDER_SITE_OTHER): Payer: Medicare PPO | Admitting: Osteopathic Medicine

## 2019-03-11 VITALS — BP 140/82 | HR 63 | Ht 66.0 in | Wt 271.0 lb

## 2019-03-11 DIAGNOSIS — E291 Testicular hypofunction: Secondary | ICD-10-CM | POA: Diagnosis not present

## 2019-03-11 MED ORDER — TESTOSTERONE CYPIONATE 200 MG/ML IM SOLN
200.0000 mg | INTRAMUSCULAR | Status: DC
  Administered 2019-03-11: 200 mg via INTRAMUSCULAR

## 2019-03-11 NOTE — Progress Notes (Signed)
Patient presents to clinic for Testosterone injection in  with no complications. Patient denies any chest pain, shortness of breath, headaches or mood swings associated with taking the Testosterone.  200 ml of DepoTestosterone was given RUOQ Patient was instructed to make a follow up in 3 weeks for the next injection. Rhonda Cunningham,CMA

## 2019-04-01 ENCOUNTER — Other Ambulatory Visit: Payer: Self-pay

## 2019-04-01 ENCOUNTER — Ambulatory Visit: Payer: Medicare PPO

## 2019-04-01 ENCOUNTER — Ambulatory Visit (INDEPENDENT_AMBULATORY_CARE_PROVIDER_SITE_OTHER): Payer: Medicare PPO | Admitting: Family Medicine

## 2019-04-01 VITALS — BP 150/75 | HR 59 | Wt 270.0 lb

## 2019-04-01 DIAGNOSIS — E291 Testicular hypofunction: Secondary | ICD-10-CM

## 2019-04-01 MED ORDER — TESTOSTERONE CYPIONATE 200 MG/ML IM SOLN
200.0000 mg | INTRAMUSCULAR | Status: DC
  Administered 2019-04-01 – 2019-06-03 (×3): 200 mg via INTRAMUSCULAR

## 2019-04-01 NOTE — Progress Notes (Signed)
   Subjective:    Patient ID: Jonathan Yu, male    DOB: 1951-01-30, 68 y.o.   MRN: [id number]  HPI Patient is here for a testosterone injection. Denies chest pain, shortness of breath, headaches, and problems with medication or mood.Richard Ritchey,CMA   Review of Systems     Objective:   Physical Exam        Assessment & Plan:  Patient tolerated injection well without complications. Patient advised to schedule next injection in 21 days.

## 2019-04-13 ENCOUNTER — Other Ambulatory Visit: Payer: Self-pay | Admitting: Family Medicine

## 2019-04-22 ENCOUNTER — Ambulatory Visit: Payer: Medicare PPO | Admitting: Family Medicine

## 2019-04-22 ENCOUNTER — Other Ambulatory Visit: Payer: Self-pay

## 2019-04-22 VITALS — BP 128/71 | HR 71 | Temp 98.0°F | Ht 66.0 in | Wt 271.0 lb

## 2019-04-22 DIAGNOSIS — Z5181 Encounter for therapeutic drug level monitoring: Secondary | ICD-10-CM

## 2019-04-22 DIAGNOSIS — I10 Essential (primary) hypertension: Secondary | ICD-10-CM

## 2019-04-22 DIAGNOSIS — E785 Hyperlipidemia, unspecified: Secondary | ICD-10-CM

## 2019-04-22 DIAGNOSIS — E291 Testicular hypofunction: Secondary | ICD-10-CM

## 2019-04-22 DIAGNOSIS — R7303 Prediabetes: Secondary | ICD-10-CM

## 2019-04-22 DIAGNOSIS — N400 Enlarged prostate without lower urinary tract symptoms: Secondary | ICD-10-CM

## 2019-04-22 DIAGNOSIS — Z23 Encounter for immunization: Secondary | ICD-10-CM | POA: Diagnosis not present

## 2019-04-22 DIAGNOSIS — Z125 Encounter for screening for malignant neoplasm of prostate: Secondary | ICD-10-CM

## 2019-04-22 NOTE — Progress Notes (Signed)
Pt here for testosterone injection no SOB,CP or mood swings. Injection tolerated well given in RUOQ. He will RTC in 3 weeks for next injection.   Before giving him the Testosterone injection. I obtained his labs that were ordered for him. Pt reports that he was fasting.   Pt here for flu shot. Afebrile,no recent illness. Vaccination given, pt tolerated well.Maryruth Eve, Lahoma Crocker, CMA

## 2019-04-22 NOTE — Progress Notes (Signed)
Patient due for routine lab work as part of testosterone monitoring.  This ideally should be done every 6 months. Labs to be done fasting in between injections.  Injection today would mean labs done on or around Monday, September 21

## 2019-04-23 LAB — COMPLETE METABOLIC PANEL WITH GFR
AG Ratio: 1.4 (calc) (ref 1.0–2.5)
ALT: 34 U/L (ref 9–46)
AST: 23 U/L (ref 10–35)
Albumin: 4 g/dL (ref 3.6–5.1)
Alkaline phosphatase (APISO): 45 U/L (ref 35–144)
BUN: 13 mg/dL (ref 7–25)
CO2: 24 mmol/L (ref 20–32)
Calcium: 9.3 mg/dL (ref 8.6–10.3)
Chloride: 104 mmol/L (ref 98–110)
Creat: 0.97 mg/dL (ref 0.70–1.25)
GFR, Est African American: 94 mL/min/{1.73_m2} (ref >=60)
GFR, Est Non African American: 81 mL/min/{1.73_m2} (ref >=60)
Globulin: 2.8 g/dL (calc) (ref 1.9–3.7)
Glucose, Bld: 125 mg/dL — ABNORMAL HIGH (ref 65–99)
Potassium: 4.3 mmol/L (ref 3.5–5.3)
Sodium: 137 mmol/L (ref 135–146)
Total Bilirubin: 2.3 mg/dL — ABNORMAL HIGH (ref 0.2–1.2)
Total Protein: 6.8 g/dL (ref 6.1–8.1)

## 2019-04-23 LAB — CBC
HCT: 51.7 % — ABNORMAL HIGH (ref 38.5–50.0)
Hemoglobin: 17.4 g/dL — ABNORMAL HIGH (ref 13.2–17.1)
MCH: 30 pg (ref 27.0–33.0)
MCHC: 33.7 g/dL (ref 32.0–36.0)
MCV: 89.1 fL (ref 80.0–100.0)
MPV: 9.6 fL (ref 7.5–12.5)
Platelets: 189 10*3/uL (ref 140–400)
RBC: 5.8 10*6/uL (ref 4.20–5.80)
RDW: 12.5 % (ref 11.0–15.0)
WBC: 6.5 10*3/uL (ref 3.8–10.8)

## 2019-04-23 LAB — LIPID PANEL W/REFLEX DIRECT LDL
Cholesterol: 122 mg/dL (ref <200)
HDL: 40 mg/dL (ref >=40)
LDL Cholesterol (Calc): 62 mg/dL (calc)
Non-HDL Cholesterol (Calc): 82 mg/dL (calc) (ref <130)
Total CHOL/HDL Ratio: 3.1 (calc) (ref <5.0)
Triglycerides: 118 mg/dL (ref <150)

## 2019-04-23 LAB — HEMOGLOBIN A1C
Hgb A1c MFr Bld: 6.2 % of total Hgb — ABNORMAL HIGH (ref <5.7)
Mean Plasma Glucose: 131 (calc)
eAG (mmol/L): 7.3 (calc)

## 2019-04-23 LAB — TESTOSTERONE: Testosterone: 253 ng/dL (ref 250–827)

## 2019-04-23 LAB — PSA: PSA: 1.7 ng/mL (ref <=4.0)

## 2019-05-13 ENCOUNTER — Other Ambulatory Visit: Payer: Self-pay

## 2019-05-13 ENCOUNTER — Ambulatory Visit (INDEPENDENT_AMBULATORY_CARE_PROVIDER_SITE_OTHER): Payer: Medicare PPO | Admitting: Family Medicine

## 2019-05-13 VITALS — BP 124/72 | HR 74 | Temp 98.2°F | Wt 272.0 lb

## 2019-05-13 DIAGNOSIS — E291 Testicular hypofunction: Secondary | ICD-10-CM | POA: Diagnosis not present

## 2019-05-13 MED ORDER — TESTOSTERONE CYPIONATE 200 MG/ML IM SOLN
200.0000 mg | Freq: Once | INTRAMUSCULAR | Status: AC
  Administered 2019-05-13: 200 mg via INTRAMUSCULAR

## 2019-05-13 NOTE — Progress Notes (Signed)
Patient is here for a testosterone injection. Denies SOB, chest pains, headaches, palpitations, mood or medication changes. Pt tolerated injection well on LUOQ without complications. Patient was advised to schedule next injection in  21 days.

## 2019-05-24 ENCOUNTER — Other Ambulatory Visit: Payer: Self-pay | Admitting: Family Medicine

## 2019-06-03 ENCOUNTER — Encounter: Payer: Self-pay | Admitting: Family Medicine

## 2019-06-03 ENCOUNTER — Ambulatory Visit: Payer: Medicare PPO | Admitting: Family Medicine

## 2019-06-03 ENCOUNTER — Other Ambulatory Visit: Payer: Self-pay

## 2019-06-03 VITALS — BP 145/75 | HR 67 | Temp 98.2°F | Wt 272.0 lb

## 2019-06-03 DIAGNOSIS — K625 Hemorrhage of anus and rectum: Secondary | ICD-10-CM | POA: Diagnosis not present

## 2019-06-03 DIAGNOSIS — G4733 Obstructive sleep apnea (adult) (pediatric): Secondary | ICD-10-CM

## 2019-06-03 DIAGNOSIS — E291 Testicular hypofunction: Secondary | ICD-10-CM

## 2019-06-03 DIAGNOSIS — K219 Gastro-esophageal reflux disease without esophagitis: Secondary | ICD-10-CM

## 2019-06-03 DIAGNOSIS — I10 Essential (primary) hypertension: Secondary | ICD-10-CM | POA: Diagnosis not present

## 2019-06-03 DIAGNOSIS — Z9989 Dependence on other enabling machines and devices: Secondary | ICD-10-CM | POA: Diagnosis not present

## 2019-06-03 DIAGNOSIS — H9202 Otalgia, left ear: Secondary | ICD-10-CM | POA: Diagnosis not present

## 2019-06-03 DIAGNOSIS — L719 Rosacea, unspecified: Secondary | ICD-10-CM

## 2019-06-03 DIAGNOSIS — E785 Hyperlipidemia, unspecified: Secondary | ICD-10-CM

## 2019-06-03 MED ORDER — LOSARTAN POTASSIUM 50 MG PO TABS: 50.0000 mg | ORAL_TABLET | Freq: Every day | ORAL | 3 refills | Status: DC

## 2019-06-03 MED ORDER — AZELAIC ACID 15 % EX GEL: 1.0000 "application " | Freq: Two times a day (BID) | CUTANEOUS | 12 refills | Status: AC

## 2019-06-03 MED ORDER — TADALAFIL 20 MG PO TABS: ORAL_TABLET | ORAL | 12 refills | Status: DC

## 2019-06-03 MED ORDER — AMBULATORY NON FORMULARY MEDICATION: 0 refills | Status: AC

## 2019-06-03 MED ORDER — ATORVASTATIN CALCIUM 40 MG PO TABS: 40.0000 mg | ORAL_TABLET | Freq: Every day | ORAL | 3 refills | Status: DC

## 2019-06-03 MED ORDER — AMBULATORY NON FORMULARY MEDICATION: 0 refills | Status: DC

## 2019-06-03 MED ORDER — TESTOSTERONE CYPIONATE 200 MG/ML IM SOLN: 200.0000 mg | Freq: Once | INTRAMUSCULAR | Status: DC

## 2019-06-03 NOTE — Patient Instructions (Addendum)
Thank you for coming in today. Continue current medicines.  I will try to get your Sleep study and mask renewed.  Try using some rubbing alcohol mixed 50/50 with hydrogen peroxide drops in the ear.   You should hear from [facility] soon.   Recheck with Dr Zigmund Daniel in 6 months.

## 2019-06-03 NOTE — Addendum Note (Signed)
Addended by: Dory Larsen on: 06/03/2019 03:54 PM   Modules accepted: Orders

## 2019-06-03 NOTE — Progress Notes (Signed)
Jonathan Yu is a 68 y.o. male who presents to [facility] today for discuss several medical issues.  CPAP and OSA; Jonathan Yu has a history of obstructive sleep apnea.  He has been using CPAP provided by [facility] for years now.  He notes that the mask is wearing out and he thinks he needs new equipment.  He mentions that he had a sleep study at [facility] ago perhaps 2011 or earlier.  He uses the CPAP nightly and definitely feels better rested and feels better while using it.  When he does not use it he feels terrible the next day.  Additionally he notes some ankle swelling bilaterally.  She has a history of significant ankle fracture on the right requiring surgery complicated by infected surgical hardware.  He receives the majority of his ankle orthopedic care for up via Dr. Sharol Given.  He notes that he has been using compression stockings recently which definitely help his ankle swelling and discomfort.  He is interested in perhaps a prescription for an ankle stockings.  He has a specific ankle compression stocking that he uses but cannot remember the name or specifics of the compression stocking.  Additionally he has a history of rosacea which is typically well managed with azelaic acid.  He notes he needs refill of this.  He also notes some issues with stool.  He notes he tends to have hard stools with some straining and occasional bleeding.  [facility]  He denies abdominal pain.  He does not use any stool softeners or laxatives.  Additionally he is having some urinary issues that he attributes to his prostate.  He has occasional frequency and urgency.  Additionally he notes some mild left ear pressure with a sensation of fluid is in his ear.  This has been present for a few days.  No fevers chills nausea  vomiting or diarrhea.     ROS as above:  Exam:  BP (!) 145/75   Pulse 67   Temp 98.2 F (36.8 C) (Oral)   Wt 272 lb (123.4 kg)   BMI 43.90 kg/m  Wt Readings from Last 5 Encounters:  06/03/19 272 lb (123.4 kg)  05/13/19 272 lb 0.6 oz (123.4 kg)  04/22/19 271 lb (122.9 kg)  04/01/19 270 lb (122.5 kg)  03/11/19 271 lb (122.9 kg)    Gen: Well NAD HEENT: EOMI,  MMM normal left tympanic membrane. Lungs: Normal work of breathing. CTABL Heart: RRR no MRG Abd: NABS, Soft. Nondistended, Nontender Exts: Brisk capillary refill, warm and well perfused.  No significant edema bilateral lower extremities. Rectal exam normal-appearing anus.  No excess large external hemorrhoid.  Normal rectal tone.  Prostate slightly enlarged nontender nonboggy no masses palpated.  Lab and Radiology Results No results found for this or any previous visit (from the past 72 hour(s)). No results found.  Recent Results (from the past 2160 hour(s))  CBC     Status: Abnormal   Collection Time: 04/22/19  9:03 AM  Result Value Ref Range   WBC 6.5 3.8 - 10.8 Thousand/uL   RBC 5.80 4.20 - 5.80 Million/uL   Hemoglobin 17.4 (H) 13.2 - 17.1 g/dL   HCT 51.7 (H) 38.5 - 50.0 %   MCV 89.1 80.0 - 100.0 fL   MCH 30.0 27.0 - 33.0 pg   MCHC 33.7 32.0 - 36.0 g/dL   RDW 12.5  11.0 - 15.0 %   Platelets 189 140 - 400 Thousand/uL   MPV 9.6 7.5 - 12.5 fL  COMPLETE METABOLIC PANEL WITH GFR     Status: Abnormal   Collection Time: 04/22/19  9:03 AM  Result Value Ref Range   Glucose, Bld 125 (H) 65 - 99 mg/dL    Comment: .            Fasting reference interval . For someone without known diabetes, a glucose value between 100 and 125 mg/dL is consistent with prediabetes and should be confirmed with a follow-up test. .    BUN 13 7 - 25 mg/dL   Creat 0.97 0.70 - 1.25 mg/dL    Comment: For patients >67 years of age, the reference limit for Creatinine is approximately 13% higher for people identified as  African-American. .    GFR, Est Non African American 81 > OR = 60 mL/min/1.66m2   GFR, Est African American 94 > OR = 60 mL/min/1.9m2   BUN/Creatinine Ratio NOT APPLICABLE 6 - 22 (calc)   Sodium 137 135 - 146 mmol/L   Potassium 4.3 3.5 - 5.3 mmol/L   Chloride 104 98 - 110 mmol/L   CO2 24 20 - 32 mmol/L   Calcium 9.3 8.6 - 10.3 mg/dL   Total Protein 6.8 6.1 - 8.1 g/dL   Albumin 4.0 3.6 - 5.1 g/dL   Globulin 2.8 1.9 - 3.7 g/dL (calc)   AG Ratio 1.4 1.0 - 2.5 (calc)   Total Bilirubin 2.3 (H) 0.2 - 1.2 mg/dL   Alkaline phosphatase (APISO) 45 35 - 144 U/L   AST 23 10 - 35 U/L   ALT 34 9 - 46 U/L  Lipid Panel w/reflex Direct LDL     Status: None   Collection Time: 04/22/19  9:03 AM  Result Value Ref Range   Cholesterol 122 <200 mg/dL   HDL 40 > OR = 40 mg/dL   Triglycerides 118 <150 mg/dL   LDL Cholesterol (Calc) 62 mg/dL (calc)    Comment: Reference range: <100 . Desirable range <100 mg/dL for primary prevention;   <70 mg/dL for patients with CHD or diabetic patients  with > or = 2 CHD risk factors. Marland Kitchen LDL-C is now calculated using the Martin-Hopkins  calculation, which is a validated novel method providing  better accuracy than the Friedewald equation in the  estimation of LDL-C.  Cresenciano Genre et al. Annamaria Helling. [id number]): 2061-2068  (http://education.[facility].com/faq/FAQ164)    Total CHOL/HDL Ratio 3.1 <5.0 (calc)   Non-HDL Cholesterol (Calc) 82 <130 mg/dL (calc)    Comment: For patients with diabetes plus 1 major ASCVD risk  factor, treating to a non-HDL-C goal of <100 mg/dL  (LDL-C of <70 mg/dL) is considered a therapeutic  option.   PSA     Status: None   Collection Time: 04/22/19  9:03 AM  Result Value Ref Range   PSA 1.7 < OR = 4.0 ng/mL    Comment: The total PSA value from this assay system is  standardized against the WHO standard. The test  result will be approximately 20% lower when compared  to the equimolar-standardized total PSA (Beckman  Coulter).  Comparison of serial PSA results should be  interpreted with this fact in mind. . This test was performed using the Siemens  chemiluminescent method. Values obtained from  different assay methods cannot be used interchangeably. PSA levels, regardless of value, should not be interpreted as absolute evidence of the presence or absence of disease.  Hemoglobin A1c     Status: Abnormal   Collection Time: 04/22/19  9:03 AM  Result Value Ref Range   Hgb A1c MFr Bld 6.2 (H) <5.7 % of total Hgb    Comment: For someone without known diabetes, a hemoglobin  A1c value between 5.7% and 6.4% is consistent with prediabetes and should be confirmed with a  follow-up test. . For someone with known diabetes, a value <7% indicates that their diabetes is well controlled. A1c targets should be individualized based on duration of diabetes, age, comorbid conditions, and other considerations. . This assay result is consistent with an increased risk of diabetes. . Currently, no consensus exists regarding use of hemoglobin A1c for diagnosis of diabetes for children. .    Mean Plasma Glucose 131 (calc)   eAG (mmol/L) 7.3 (calc)  Testosterone     Status: None   Collection Time: 04/22/19  9:03 AM  Result Value Ref Range   Testosterone 253 250 - 827 ng/dL     Assessment and Plan: 68 y.o. male with  Sleep apnea: Currently using CPAP.  Will need new supplies.  Fundamental problem will be finding the old original sleep study.  I did some digging through the chart and did find documentation from [facility] documenting history of sleep study.  Will provide this with new order and today's note to hopefully get new supplies ordered.  If this is not sufficient to meet insurance needs patient will also look through his own records to try to find the original sleep study documentation.  It is possible that the original sleep study documentation is no longer available in which case we will order  home sleep study. Jonathan Yu  Hypertension: Blood pressure typically better controlled at previous visits at home.  Watchful waiting.  Rectal bleeding: Likely due to hard stools.  Plan for stool softener such as Colace and MiraLAX.  However patient is essentially due for repeat colonoscopy.  [facility].  Ankle swelling.  Plan for compression stockings.  Patient will provide me the information on the stockings he is currently using and I will attempt to order them.  It is likely there are cheaper pain for it himself.  Prostate: Prostate slightly enlarged and PSA normal and stable.  Watchful waiting.  Consider Flomax in the future if needed.  Left ear canal and tympanic membrane look normal.  Watchful waiting consider over-the-counter eardrops.  Rosacea: Medications refilled.  Chronic medications refilled.  Recheck 6 months with new PCP.   PDMP not reviewed this encounter. Orders Placed This Encounter  Procedures  . Ambulatory referral to Gastroenterology    Referral Priority:   Routine    Referral Type:   Consultation    Referral Reason:   Specialty Services Required    Number of Visits Requested:   1   Meds ordered this encounter  Medications  . losartan (COZAAR) 50 MG tablet    Sig: Take 1 tablet (50 mg total) by mouth daily.    Dispense:  90 tablet    Refill:  3  . atorvastatin (LIPITOR) 40 MG tablet    Sig: Take 1 tablet (40 mg total) by mouth daily.    Dispense:  90 tablet    Refill:  3  . tadalafil (CIALIS) 20 MG tablet    Sig: TAKE HALF TO ONE TABLET BY MOUTH EVERY OTHER DAY AS NEEDED FOR ERECTILE DYSFUNCTION    Dispense:  30 tablet    Refill:  12  . Azelaic Acid 15 % cream    Sig: Apply 1 application topically 2 (two) times daily.    Dispense:  50 g    Refill:  12  . DISCONTD: AMBULATORY NON FORMULARY MEDICATION    Sig: Massage therapy order.  Treatment weekly for 12 weeks for leg pain.    Dispense:  1 each    Refill:   0  . AMBULATORY NON FORMULARY MEDICATION    Sig: Continue CPAP at current settings.  Please provide new supplies. Jonathan Yu phone number [phone]    Dispense:  1 each    Refill:  0     Historical information moved to improve visibility of documentation.  Past Medical History:  Diagnosis Date  . Allergy   . DDD (degenerative disc disease), lumbar   . Hyperlipidemia    "no meds since losing 106# 2 yr ago" (10/06/2013)  . Hypertension 2012   "no meds since losing 106# 2 yr ago" (10/06/2013)  . OSA on CPAP    "don't wear mask much since losing 106# 2 yr ago" (10/06/2013)  . Type II diabetes mellitus ([place])    "no meds since losing 106# 2 yr ago" (10/06/2013)  . Walking pneumonia 1968   Past Surgical History:  Procedure Laterality Date  . ANKLE FRACTURE SURGERY Left 1973  . ANKLE RECONSTRUCTION Right 05/2011  . CARPAL TUNNEL RELEASE Bilateral 2001  . HARDWARE REMOVAL Right 10/07/2013   Procedure: RIGHT ANKLE HARDWARE REMOVAL;  Surgeon: Newt Minion, MD;  Location: [facility];  Service: Orthopedics;  Laterality: Right;  . I&D EXTREMITY Right 11/18/2013   Procedure: IRRIGATION AND DEBRIDEMENT EXTREMITY;  Surgeon: Newt Minion, MD;  Location: [facility];  Service: Orthopedics;  Laterality: Right;  Irrigation and Debridement Right Fibula , Place Antibiotic Beads and VAC  . NASAL SEPTUM SURGERY  1982  . ORIF ANKLE FRACTURE Right 09/08/2013   Procedure: REPAIR SYNDESMOSIS DISRUPTION RIGHT ANKLE;  Surgeon: Newt Minion, MD;  Location: [facility];  Service: Orthopedics;  Laterality: Right;  . SHOULDER ARTHROSCOPY W/ ROTATOR CUFF REPAIR Right 2006; 2009  . TONSILLECTOMY  1959  . TOTAL ANKLE ARTHROPLASTY Right 08/18/2013   Procedure: TOTAL ANKLE ARTHOPLASTY;  Surgeon: Newt Minion, MD;  Location: [facility];  Service: Orthopedics;  Laterality: Right;  Right Total Ankle Arthroplasty, Revision Fibular Fracture  . URETERAL STENT PLACEMENT  ~ 2010 X 2   Social History   Tobacco Use  . Smoking status:  Current Some Day Smoker    Years: 6.00    Types: Pipe, Cigars  . Smokeless tobacco: Never Used  . Tobacco comment: 10/06/2013 "use pipe or cigar 1-2 times/month"  Substance Use Topics  . Alcohol use: Yes    Comment: 10/06/2013 "have 4-5 drinks/yr"   family history includes Alzheimer's disease in his father; Cancer (age of onset: 43) in his mother; Cleft lip in his sister; Parkinson's disease in his father; Scoliosis in his sister.  Medications: Current Outpatient Medications  Medication Sig Dispense Refill  . albuterol (PROVENTIL HFA;VENTOLIN HFA) 108 (90 Base) MCG/ACT inhaler Inhale 2 puffs into the lungs every 6 (six) hours as needed for wheezing or shortness of breath. 1 Inhaler 0  . atorvastatin (LIPITOR) 40 MG tablet Take 1 tablet (40 mg total) by mouth daily. 90 tablet 3  . Azelaic Acid 15 % cream Apply 1 application topically 2 (two) times daily. 50 g 12  . EQ ALLERGY RELIEF, CETIRIZINE, 10 MG tablet Take 1 tablet by mouth once  daily 90 tablet 0  . ibuprofen (ADVIL,MOTRIN) 200 MG tablet Take 200 mg by mouth every 6 (six) hours as needed.    . loratadine (CLARITIN) 10 MG tablet Take 10 mg by mouth daily.    Marland Kitchen losartan (COZAAR) 50 MG tablet Take 1 tablet (50 mg total) by mouth daily. 90 tablet 3  . Misc. Devices (FLEX THERAPY) MISC by Does not apply route.    . Multiple Vitamin (MULTIVITAMIN WITH MINERALS) TABS tablet Take 1 tablet by mouth daily.    . naproxen sodium (ALEVE) 220 MG tablet Take 220 mg by mouth 2 (two) times daily with a meal.    . Omega-3 Fatty Acids (FISH OIL PO) Take 1 capsule by mouth daily.    Marland Kitchen OVER THE COUNTER MEDICATION Take 1 tablet by mouth 4 (four) times daily - after meals and at bedtime. -pH balance formula Alkolete Yoli    . tadalafil (CIALIS) 20 MG tablet TAKE HALF TO ONE TABLET BY MOUTH EVERY OTHER DAY AS NEEDED FOR ERECTILE DYSFUNCTION 30 tablet 12  . AMBULATORY NON FORMULARY MEDICATION Continue CPAP at current settings.  Please provide new supplies.  Jonathan Yu phone number [phone] 1 each 0   Current Facility-Administered Medications  Medication Dose Route Frequency Provider Last Rate Last Dose  . testosterone cypionate (DEPOTESTOSTERONE CYPIONATE) injection 200 mg  200 mg Intramuscular Q21 days Gregor Hams, MD   200 mg at 03/11/19 1057  . testosterone cypionate (DEPOTESTOSTERONE CYPIONATE) injection 200 mg  200 mg Intramuscular Q21 days Gregor Hams, MD   200 mg at 04/22/19 1055   Allergies  Allergen Reactions  . Sesame Oil Hives and Shortness Of Breath  . Percocet [Oxycodone-Acetaminophen] Rash  . Topamax [Topiramate]     Cognitive decline  . Vicodin [Hydrocodone-Acetaminophen] Rash     Discussed warning signs or symptoms. Please see discharge instructions. Patient expresses understanding.

## 2019-06-04 LAB — HM COLONOSCOPY

## 2019-06-07 DIAGNOSIS — G4733 Obstructive sleep apnea (adult) (pediatric): Secondary | ICD-10-CM | POA: Diagnosis not present

## 2019-06-08 ENCOUNTER — Telehealth: Payer: Self-pay | Admitting: Family Medicine

## 2019-06-08 NOTE — Telephone Encounter (Signed)
Received [facility] from [facility].  Service was performed September 06, 2009.  Plan for repeat colonoscopy in 10 years.  Note will be sent abstract.

## 2019-06-09 ENCOUNTER — Telehealth: Payer: Self-pay | Admitting: Family Medicine

## 2019-06-09 MED ORDER — TAMSULOSIN HCL 0.4 MG PO CAPS: 0.4000 mg | ORAL_CAPSULE | Freq: Every day | ORAL | 3 refills | Status: DC

## 2019-06-09 MED ORDER — AMBULATORY NON FORMULARY MEDICATION: 99 refills | Status: AC

## 2019-06-09 NOTE — Telephone Encounter (Signed)
Sorry for the confusion with the voicemail.  I am not sure why we did not get those voicemails.  We will look into it.  I have sent Flomax to [facility] in [place].  This is the medication for the prostate.  Also have printed a prescription for compression stockings.  This is ready for pickup or we can scan it and email it to you.  If you have trouble reaching somebody talking to triage or talking to the front desk is always a good option.

## 2019-06-09 NOTE — Telephone Encounter (Signed)
Patient called and asked for me because he states he has left 4 voicemail's on Rhonda's line and no one has called him back... He would like Dr.Corey to know 1. He got his CPAP and thank you 2. Him and Dr.Corey discussed starting a prostate medication but it was not called in. 3. He needs a script for the Compression Socks and the brand is Vive and size is - LGM  If someone could call this patient back and let him know this has been taken care of, he would greatly appreciate it. Thanks, [employer]

## 2019-06-09 NOTE — Telephone Encounter (Signed)
I called the patient and he was appreciative of the all the efforts from Dr. Georgina Snell.  He is aware that the medication from the pharmacy. He wants the socks sent to some medical supply place.

## 2019-06-16 NOTE — Telephone Encounter (Signed)
Patient can pick up at his visit next week for his compression stockings.

## 2019-06-18 DIAGNOSIS — K625 Hemorrhage of anus and rectum: Secondary | ICD-10-CM | POA: Diagnosis not present

## 2019-06-18 DIAGNOSIS — Z1211 Encounter for screening for malignant neoplasm of colon: Secondary | ICD-10-CM | POA: Diagnosis not present

## 2019-06-18 DIAGNOSIS — K219 Gastro-esophageal reflux disease without esophagitis: Secondary | ICD-10-CM | POA: Diagnosis not present

## 2019-06-21 ENCOUNTER — Encounter: Payer: Self-pay | Admitting: Family Medicine

## 2019-06-24 ENCOUNTER — Ambulatory Visit: Payer: Medicare PPO

## 2019-06-28 NOTE — Telephone Encounter (Signed)
Patient called and was having sinus pressure and wanted to have a visit in the office. I let him know that he would need to make a virtual appointment. He declined the virtual and is going to [facility] to be evaluated.

## 2019-07-01 ENCOUNTER — Ambulatory Visit: Payer: Medicare PPO

## 2019-07-07 ENCOUNTER — Ambulatory Visit (INDEPENDENT_AMBULATORY_CARE_PROVIDER_SITE_OTHER): Payer: Medicare PPO | Admitting: Sports Medicine

## 2019-07-07 ENCOUNTER — Other Ambulatory Visit: Payer: Self-pay

## 2019-07-07 VITALS — BP 142/78 | HR 80 | Temp 98.4°F | Wt 266.0 lb

## 2019-07-07 DIAGNOSIS — E291 Testicular hypofunction: Secondary | ICD-10-CM

## 2019-07-07 MED ORDER — TESTOSTERONE CYPIONATE 200 MG/ML IM SOLN
200.0000 mg | Freq: Once | INTRAMUSCULAR | Status: AC
  Administered 2019-07-07: 200 mg via INTRAMUSCULAR

## 2019-07-07 NOTE — Progress Notes (Signed)
Pt in today for testosterone injection. Medications and allergies reviewed. Pt denies chest pain, shortness of breath, headaches and denies problems with medication or mood changes. Pt received testosterone in RUOQ . Patient tolerated injection well without complications. Pt advised to schedule next injection in 14 days.

## 2019-07-14 DIAGNOSIS — G4733 Obstructive sleep apnea (adult) (pediatric): Secondary | ICD-10-CM | POA: Diagnosis not present

## 2019-07-15 ENCOUNTER — Ambulatory Visit: Payer: Medicare PPO

## 2019-07-26 ENCOUNTER — Ambulatory Visit (INDEPENDENT_AMBULATORY_CARE_PROVIDER_SITE_OTHER): Payer: Medicare PPO | Admitting: Physician Assistant

## 2019-07-26 ENCOUNTER — Encounter: Payer: Self-pay | Admitting: Physician Assistant

## 2019-07-26 VITALS — BP 128/76 | Temp 98.0°F | Ht 66.0 in | Wt 260.0 lb

## 2019-07-26 DIAGNOSIS — J3489 Other specified disorders of nose and nasal sinuses: Secondary | ICD-10-CM

## 2019-07-26 DIAGNOSIS — U071 COVID-19: Secondary | ICD-10-CM | POA: Diagnosis not present

## 2019-07-26 DIAGNOSIS — R05 Cough: Secondary | ICD-10-CM

## 2019-07-26 DIAGNOSIS — R059 Cough, unspecified: Secondary | ICD-10-CM

## 2019-07-26 MED ORDER — AMOXICILLIN-POT CLAVULANATE 875-125 MG PO TABS: 1.0000 | ORAL_TABLET | Freq: Two times a day (BID) | ORAL | 0 refills | Status: DC

## 2019-07-26 MED ORDER — PREDNISONE 50 MG PO TABS: ORAL_TABLET | ORAL | 0 refills | Status: DC

## 2019-07-26 NOTE — Progress Notes (Signed)
Thought he had sinus infection Had those symptoms past 3-4 weeks (congestion/blood when blows nose)  Was tested for Covid on Saturday and was positive 101 highest temp the last couple days  Fever and headache bothering him the most Taking some aspirin and vitamins Just wants some advise

## 2019-07-26 NOTE — Progress Notes (Signed)
Patient ID: Jonathan Yu, male   DOB: 06-Apr-1951, 68 y.o.   MRN: [id number] .Marland KitchenVirtual Visit via Video Note  I connected with Jonathan Yu on 07/26/2019 at  1:40 PM EST by a video enabled telemedicine application and verified that I am speaking with the correct person using two identifiers.  Location: Patient: home Provider: clinic   I discussed the limitations of evaluation and management by telemedicine and the availability of in person appointments. The patient expressed understanding and agreed to proceed.  History of Present Illness: Patient is a 68 year old male with hypertension, OSA on CPAP, GERD, hypogonadism who calls into the clinic after a positive Covid test.  He is concerned about his progression.  About 3 to 4 weeks ago he felt like he had more sinus symptoms.  He treated it symptomatically and over-the-counter medications he started feeling a little better.  Then Friday, 3 days ago, he noticed he started feeling more feverish and weak. His fever up to 101. He cannot smell and taste is changing. He went and got covid tested and positive. He is feeling more run down and tried. He continues to cough but more dry. He has a lot of sinus pressure and congestion coming back. He is taking tylenol flu, zinc, pepcid, vitamin D.   .. Active Ambulatory Problems    Diagnosis Date Noted  . Essential hypertension, benign 12/20/2010  . Dyslipidemia 12/20/2010  . OSA on CPAP 01/24/2011  . Right lumbar radiculopathy 01/24/2011  . Kidney stones 01/28/2011  . ED (erectile dysfunction) 09/10/2011  . BPH (benign prostatic hyperplasia) 09/10/2011  . History of total ankle replacement, right 08/18/2013  . Morbid obesity ([place]) 12/21/2013  . Osteomyelitis ([place]) 12/21/2013  . Muscle spasm 03/07/2016  . Unilateral primary osteoarthritis, left knee 09/12/2016  . Idiopathic chronic venous hypertension of both lower extremities with inflammation 09/12/2016  . Rosacea 05/29/2017  . GERD  (gastroesophageal reflux disease) 07/02/2017  . Pain and swelling of left lower leg 09/22/2017  . Acute pain of left shoulder 09/22/2017  . Complete tear of left rotator cuff 10/02/2017  . Chronic left shoulder pain 10/02/2017  . Hypogonadism in male 01/14/2018  . Prediabetes 04/30/2018   Resolved Ambulatory Problems    Diagnosis Date Noted  . Weight gain 12/20/2010  . Fatigue 12/20/2010  . Obesity, Class III, BMI 40-49.9 (morbid obesity) ([place]) 12/20/2010  . Type 2 diabetes mellitus ([place]) 01/24/2011  . Testosterone deficiency 01/24/2011  . Infected hardware in right leg ([place]) 10/08/2013  . Acute osteomyelitis of right fibula ([place]) 11/18/2013  . Gastritis 01/02/2016  . Cellulitis and abscess of left leg 09/22/2017  . Seroma due to trauma [facility]) 10/02/2017   Past Medical History:  Diagnosis Date  . Allergy   . DDD (degenerative disc disease), lumbar   . Hyperlipidemia   . Hypertension 2012  . Type II diabetes mellitus ([employer])   . Walking pneumonia 1968   Reviewed med, allergy, problem list.   Observations/Objective: No acute distress. Dry cough on video.  No labored breathing.  Normal appearance and mood.   .. Today's Vitals   07/26/19 1316  BP: 128/76  Temp: 98 F (36.7 C)  TempSrc: Oral  Weight: 260 lb (117.9 kg)  Height: 5\' 6"  (1.676 m)   Body mass index is 41.97 kg/m.    Assessment and Plan: Marland KitchenMarland KitchenTukker was seen today for advice only.  Diagnoses and all orders for this visit:  COVID-19 virus infection  Sinus pressure -     predniSONE (DELTASONE) 50 MG  tablet; Take one tablet for 5 days. -     amoxicillin-clavulanate (AUGMENTIN) 875-125 MG tablet; Take 1 tablet by mouth 2 (two) times daily. Take 1 tab twice a day for 10 days.  Cough -     predniSONE (DELTASONE) 50 MG tablet; Take one tablet for 5 days. -     amoxicillin-clavulanate (AUGMENTIN) 875-125 MG tablet; Take 1 tablet by mouth 2 (two) times daily. Take 1 tab twice a day for 10 days.   Self  isolate until December 21st. Continue to treat with symptomatic care. He has albuterol to use as needed. Monitor breathing and o2 stats if can. Rest and hydrate. Due to viral infection right before this hit and continues to have sinus symptoms will treat for sinusitis with augmentin. Prednisone for cough. Worsening SOB go to ED. Follow up as needed.     Follow Up Instructions:    I discussed the assessment and treatment plan with the patient. The patient was provided an opportunity to ask questions and all were answered. The patient agreed with the plan and demonstrated an understanding of the instructions.   The patient was advised to call back or seek an in-person evaluation if the symptoms worsen or if the condition fails to improve as anticipated.  Iran Planas, PA-C

## 2019-07-29 ENCOUNTER — Ambulatory Visit: Payer: Medicare PPO

## 2019-07-31 ENCOUNTER — Emergency Department (HOSPITAL_BASED_OUTPATIENT_CLINIC_OR_DEPARTMENT_OTHER): Payer: Medicare PPO

## 2019-07-31 ENCOUNTER — Encounter (HOSPITAL_BASED_OUTPATIENT_CLINIC_OR_DEPARTMENT_OTHER): Payer: Self-pay | Admitting: Emergency Medicine

## 2019-07-31 ENCOUNTER — Emergency Department (HOSPITAL_BASED_OUTPATIENT_CLINIC_OR_DEPARTMENT_OTHER)
Admission: EM | Admit: 2019-07-31 | Discharge: 2019-07-31 | Disposition: A | Discharge status: Home / Self Care | Payer: Medicare PPO | Attending: Emergency Medicine | Admitting: Emergency Medicine

## 2019-07-31 ENCOUNTER — Other Ambulatory Visit: Payer: Self-pay

## 2019-07-31 DIAGNOSIS — I10 Essential (primary) hypertension: Secondary | ICD-10-CM | POA: Insufficient documentation

## 2019-07-31 DIAGNOSIS — E119 Type 2 diabetes mellitus without complications: Secondary | ICD-10-CM | POA: Insufficient documentation

## 2019-07-31 DIAGNOSIS — R05 Cough: Secondary | ICD-10-CM | POA: Diagnosis not present

## 2019-07-31 DIAGNOSIS — Z79899 Other long term (current) drug therapy: Secondary | ICD-10-CM | POA: Diagnosis not present

## 2019-07-31 DIAGNOSIS — F17290 Nicotine dependence, other tobacco product, uncomplicated: Secondary | ICD-10-CM | POA: Diagnosis not present

## 2019-07-31 DIAGNOSIS — F419 Anxiety disorder, unspecified: Secondary | ICD-10-CM | POA: Diagnosis not present

## 2019-07-31 DIAGNOSIS — U071 COVID-19: Secondary | ICD-10-CM | POA: Insufficient documentation

## 2019-07-31 DIAGNOSIS — R0602 Shortness of breath: Secondary | ICD-10-CM

## 2019-07-31 DIAGNOSIS — Z96661 Presence of right artificial ankle joint: Secondary | ICD-10-CM | POA: Diagnosis not present

## 2019-07-31 DIAGNOSIS — Z960 Presence of urogenital implants: Secondary | ICD-10-CM | POA: Diagnosis not present

## 2019-07-31 LAB — CBC WITH DIFFERENTIAL/PLATELET
Abs Immature Granulocytes: 0.05 10*3/uL (ref 0.00–0.07)
Basophils Absolute: 0 10*3/uL (ref 0.0–0.1)
Basophils Relative: 0 %
Eosinophils Absolute: 0 10*3/uL (ref 0.0–0.5)
Eosinophils Relative: 0 %
HCT: 49.4 % (ref 39.0–52.0)
Hemoglobin: 16.6 g/dL (ref 13.0–17.0)
Immature Granulocytes: 1 %
Lymphocytes Relative: 13 %
Lymphs Abs: 1 10*3/uL (ref 0.7–4.0)
MCH: 29.3 pg (ref 26.0–34.0)
MCHC: 33.6 g/dL (ref 30.0–36.0)
MCV: 87.1 fL (ref 80.0–100.0)
Monocytes Absolute: 0.5 10*3/uL (ref 0.1–1.0)
Monocytes Relative: 7 %
Neutro Abs: 5.8 10*3/uL (ref 1.7–7.7)
Neutrophils Relative %: 79 %
Platelets: 201 10*3/uL (ref 150–400)
RBC: 5.67 MIL/uL (ref 4.22–5.81)
RDW: 12 % (ref 11.5–15.5)
WBC: 7.4 10*3/uL (ref 4.0–10.5)
nRBC: 0 % (ref 0.0–0.2)

## 2019-07-31 LAB — BASIC METABOLIC PANEL
Anion gap: 11 (ref 5–15)
BUN: 17 mg/dL (ref 8–23)
CO2: 21 mmol/L — ABNORMAL LOW (ref 22–32)
Calcium: 8.9 mg/dL (ref 8.9–10.3)
Chloride: 103 mmol/L (ref 98–111)
Creatinine, Ser: 0.92 mg/dL (ref 0.61–1.24)
GFR calc Af Amer: >60 mL/min (ref >60)
GFR calc non Af Amer: >60 mL/min (ref >60)
Glucose, Bld: 154 mg/dL — ABNORMAL HIGH (ref 70–99)
Potassium: 3.7 mmol/L (ref 3.5–5.1)
Sodium: 135 mmol/L (ref 135–145)

## 2019-07-31 MED ORDER — ALBUTEROL SULFATE HFA 108 (90 BASE) MCG/ACT IN AERS
2.0000 | INHALATION_SPRAY | Freq: Once | RESPIRATORY_TRACT | Status: AC
  Administered 2019-07-31: 2 via RESPIRATORY_TRACT

## 2019-07-31 MED ORDER — ALBUTEROL SULFATE HFA 108 (90 BASE) MCG/ACT IN AERS
2.0000 | INHALATION_SPRAY | Freq: Once | RESPIRATORY_TRACT | Status: AC
  Administered 2019-07-31: 2 via RESPIRATORY_TRACT
  Filled 2019-07-31: qty 6.7

## 2019-07-31 MED ORDER — LORAZEPAM 1 MG PO TABS
1.0000 mg | ORAL_TABLET | Freq: Once | ORAL | Status: AC
  Administered 2019-07-31: 1 mg via ORAL
  Filled 2019-07-31: qty 1

## 2019-07-31 MED ORDER — HYDROXYZINE HCL 25 MG PO TABS: 25.0000 mg | ORAL_TABLET | Freq: Two times a day (BID) | ORAL | 0 refills | Status: DC

## 2019-07-31 MED ORDER — IBUPROFEN 400 MG PO TABS
600.0000 mg | ORAL_TABLET | Freq: Once | ORAL | Status: AC
  Administered 2019-07-31: 600 mg via ORAL
  Filled 2019-07-31: qty 1

## 2019-07-31 MED ORDER — BENZONATATE 100 MG PO CAPS: 100.0000 mg | ORAL_CAPSULE | Freq: Three times a day (TID) | ORAL | 0 refills | Status: AC

## 2019-07-31 NOTE — ED Notes (Signed)
Patient verbalizes understanding of discharge instructions. Opportunity for questioning and answers were provided. Armband removed by staff, pt discharged from ED.  

## 2019-07-31 NOTE — ED Triage Notes (Signed)
He is COVID+. C/o ongoing SOB and anxiety. Denies pain

## 2019-07-31 NOTE — ED Provider Notes (Addendum)
[facility] EMERGENCY DEPARTMENT Provider Note   CSN: [id number] Arrival date & time: 07/31/19  [id number]     History Chief Complaint  Patient presents with  . Shortness of Breath    COVID+    Jonathan Yu is a 68 y.o. male.  HPI   Pt is a 68 y/o male with a h/o DDD, HLD, HTN, OSA, who presents to the ED today for eval of SOB. States he has had cough, rhinorrhea, congestion for 6 weeks, sxs seemed worse last week around 12/11 and he started developing fevers. Dx with COVID 12/12. Over the last few days his sxs have worsened and he has felt more short of breath. He seems to feel more short of breath with sitting rather than ADLs. He also reports significant anxiety secondary to his sxs. Cough is mostly dry, denies hemoptysis. Denies chest pain or pleuritic pain. Has some diarrhea, but denies nausea or vomiting. He thinks that his anxiety is contributing to the majority of his sxs.   Past Medical History:  Diagnosis Date  . Allergy   . DDD (degenerative disc disease), lumbar   . Hyperlipidemia    "no meds since losing 106# 2 yr ago" (10/06/2013)  . Hypertension 2012   "no meds since losing 106# 2 yr ago" (10/06/2013)  . OSA on CPAP    "don't wear mask much since losing 106# 2 yr ago" (10/06/2013)  . Type II diabetes mellitus ([place])    "no meds since losing 106# 2 yr ago" (10/06/2013)  . Walking pneumonia 1968    Patient Active Problem List   Diagnosis Date Noted  . Prediabetes 04/30/2018  . Hypogonadism in male 01/14/2018  . Complete tear of left rotator cuff 10/02/2017  . Chronic left shoulder pain 10/02/2017  . Pain and swelling of left lower leg 09/22/2017  . Acute pain of left shoulder 09/22/2017  . GERD (gastroesophageal reflux disease) 07/02/2017  . Rosacea 05/29/2017  . Unilateral primary osteoarthritis, left knee 09/12/2016  . Idiopathic chronic venous hypertension of both lower extremities with inflammation 09/12/2016  . Muscle spasm 03/07/2016  . Morbid  obesity ([place]) 12/21/2013  . Osteomyelitis ([place]) 12/21/2013  . History of total ankle replacement, right 08/18/2013  . ED (erectile dysfunction) 09/10/2011  . BPH (benign prostatic hyperplasia) 09/10/2011  . Kidney stones 01/28/2011  . OSA on CPAP 01/24/2011  . Right lumbar radiculopathy 01/24/2011  . Essential hypertension, benign 12/20/2010  . Dyslipidemia 12/20/2010    Past Surgical History:  Procedure Laterality Date  . ANKLE FRACTURE SURGERY Left 1973  . ANKLE RECONSTRUCTION Right 05/2011  . CARPAL TUNNEL RELEASE Bilateral 2001  . HARDWARE REMOVAL Right 10/07/2013   Procedure: RIGHT ANKLE HARDWARE REMOVAL;  Surgeon: Newt Minion, MD;  Location: [facility];  Service: Orthopedics;  Laterality: Right;  . I & D EXTREMITY Right 11/18/2013   Procedure: IRRIGATION AND DEBRIDEMENT EXTREMITY;  Surgeon: Newt Minion, MD;  Location: [facility];  Service: Orthopedics;  Laterality: Right;  Irrigation and Debridement Right Fibula , Place Antibiotic Beads and VAC  . NASAL SEPTUM SURGERY  1982  . ORIF ANKLE FRACTURE Right 09/08/2013   Procedure: REPAIR SYNDESMOSIS DISRUPTION RIGHT ANKLE;  Surgeon: Newt Minion, MD;  Location: [facility];  Service: Orthopedics;  Laterality: Right;  . SHOULDER ARTHROSCOPY W/ ROTATOR CUFF REPAIR Right 2006; 2009  . TONSILLECTOMY  1959  . TOTAL ANKLE ARTHROPLASTY Right 08/18/2013   Procedure: TOTAL ANKLE ARTHOPLASTY;  Surgeon: Newt Minion, MD;  Location: [facility];  Service: Orthopedics;  Laterality: Right;  Right Total Ankle Arthroplasty, Revision Fibular Fracture  . URETERAL STENT PLACEMENT  ~ 2010 X 2       Family History  Problem Relation Age of Onset  . Cancer Mother 26       Lung  . Alzheimer's disease Father   . Parkinson's disease Father   . Scoliosis Sister   . Cleft lip Sister     Social History   Tobacco Use  . Smoking status: Current Some Day Smoker    Years: 6.00    Types: Pipe, Cigars  . Smokeless tobacco: Never Used  . Tobacco comment: 10/06/2013 "use  pipe or cigar 1-2 times/month"  Substance Use Topics  . Alcohol use: Yes    Comment: 10/06/2013 "have 4-5 drinks/yr"  . Drug use: No    Home Medications Prior to Admission medications   Medication Sig Start Date End Date Taking? Authorizing Provider  albuterol (PROVENTIL HFA;VENTOLIN HFA) 108 (90 Base) MCG/ACT inhaler Inhale 2 puffs into the lungs every 6 (six) hours as needed for wheezing or shortness of breath. 07/31/17   Gregor Hams, MD  AMBULATORY NON FORMULARY MEDICATION Continue CPAP at current settings.  Please provide new supplies. Micheal Likens phone number [phone] 06/03/19   Gregor Hams, MD  AMBULATORY NON FORMULARY MEDICATION Compression stockings.  Vive size LGM Leg swelling. Disp 1 pair 99 refil. 06/09/19   Gregor Hams, MD  amoxicillin-clavulanate (AUGMENTIN) 875-125 MG tablet Take 1 tablet by mouth 2 (two) times daily. Take 1 tab twice a day for 10 days. 07/26/19   Breeback, Jade L, PA-C  atorvastatin (LIPITOR) 40 MG tablet Take 1 tablet (40 mg total) by mouth daily. 06/03/19   Gregor Hams, MD  Azelaic Acid 15 % cream Apply 1 application topically 2 (two) times daily. 06/03/19   Gregor Hams, MD  benzonatate (TESSALON) 100 MG capsule Take 1 capsule (100 mg total) by mouth every 8 (eight) hours for 5 days. 07/31/19 08/05/19  Olimpia Tinch S, PA-C  EQ ALLERGY RELIEF, CETIRIZINE, 10 MG tablet Take 1 tablet by mouth once daily 05/24/19   Gregor Hams, MD  hydrOXYzine (ATARAX/VISTARIL) 25 MG tablet Take 1 tablet (25 mg total) by mouth 2 times daily at 12 noon and 4 pm. 07/31/19   Caliana Spires S, PA-C  ibuprofen (ADVIL,MOTRIN) 200 MG tablet Take 200 mg by mouth every 6 (six) hours as needed.    [provider]  loratadine (CLARITIN) 10 MG tablet Take 10 mg by mouth daily.    [provider]  losartan (COZAAR) 50 MG tablet Take 1 tablet (50 mg total) by mouth daily. 06/03/19   Gregor Hams, MD  Misc. Devices (FLEX THERAPY) MISC by Does not  apply route.    [provider]  Multiple Vitamin (MULTIVITAMIN WITH MINERALS) TABS tablet Take 1 tablet by mouth daily.    [provider]  naproxen sodium (ALEVE) 220 MG tablet Take 220 mg by mouth 2 (two) times daily with a meal.    [provider]  Omega-3 Fatty Acids (FISH OIL PO) Take 1 capsule by mouth daily.    [provider]  OVER THE COUNTER MEDICATION Take 1 tablet by mouth 4 (four) times daily - after meals and at bedtime. -pH balance formula Alkolete Yoli    [provider]  predniSONE (DELTASONE) 50 MG tablet Take one tablet for 5 days. 07/26/19   Breeback, Jade L, PA-C  tadalafil (CIALIS) 20  MG tablet TAKE HALF TO ONE TABLET BY MOUTH EVERY OTHER DAY AS NEEDED FOR ERECTILE DYSFUNCTION 06/03/19   Gregor Hams, MD  tamsulosin [facility]) 0.4 MG CAPS capsule Take 1 capsule (0.4 mg total) by mouth daily after breakfast. 06/09/19   Gregor Hams, MD    Allergies    Sesame oil, Percocet [oxycodone-acetaminophen], Topamax [topiramate], and Vicodin [hydrocodone-acetaminophen]  Review of Systems   Review of Systems  Constitutional: Positive for chills and fever.  HENT: Positive for congestion and rhinorrhea. Negative for ear pain and sore throat.   Eyes: Negative for pain and visual disturbance.  Respiratory: Positive for cough and shortness of breath.   Cardiovascular: Negative for chest pain, palpitations and leg swelling.  Gastrointestinal: Positive for diarrhea. Negative for abdominal pain, constipation, nausea and vomiting.  Genitourinary: Negative for dysuria and hematuria.  Musculoskeletal: Negative for back pain.  Skin: Negative for color change and rash.  Neurological: Negative for headaches.  All other systems reviewed and are negative.   Physical Exam Updated Vital Signs BP 120/63   Pulse (!) 103   Temp 99.7 F (37.6 C) (Oral)   Resp 20   Ht 5\' 6"  (1.676 m)   Wt 115.7 kg   SpO2 90%   BMI 41.16 kg/m   Physical  Exam Vitals and nursing note reviewed.  Constitutional:      Appearance: He is well-developed.  HENT:     Head: Normocephalic and atraumatic.  Eyes:     Conjunctiva/sclera: Conjunctivae normal.  Cardiovascular:     Rate and Rhythm: Normal rate and regular rhythm.     Heart sounds: Normal heart sounds. No murmur.  Pulmonary:     Effort: Pulmonary effort is normal. No respiratory distress.     Breath sounds: Examination of the right-lower field reveals rales. Examination of the left-lower field reveals rales. Rales present. No decreased breath sounds, wheezing or rhonchi.     Comments: Speaking in full sentences without tachypnea Abdominal:     General: Bowel sounds are normal.     Palpations: Abdomen is soft.     Tenderness: There is no abdominal tenderness.  Musculoskeletal:     Cervical back: Neck supple.     Right lower leg: No tenderness. No edema.     Left lower leg: No tenderness. No edema.  Skin:    General: Skin is warm and dry.  Neurological:     Mental Status: He is alert.  Psychiatric:     Comments: Anxious appearing     ED Results / Procedures / Treatments   Labs (all labs ordered are listed, but only abnormal results are displayed) Labs Reviewed  BASIC METABOLIC PANEL - Abnormal; Notable for the following components:      Result Value   CO2 21 (*)    Glucose, Bld 154 (*)    All other components within normal limits  CBC WITH DIFFERENTIAL/PLATELET    EKG EKG Interpretation  Date/Time:  Saturday July 31 2019 09:06:16 EST Ventricular Rate:  84 PR Interval:    QRS Duration: 93 QT Interval:  364 QTC Calculation: 431 R Axis:   -59 Text Interpretation: Sinus rhythm LAD, consider left anterior fascicular block Confirmed by Quintella Reichert [phone]) on 07/31/2019 10:07:17 AM   Radiology DG Chest Portable 1 View  Result Date: 07/31/2019 CLINICAL DATA:  Cough and shortness of breath.  COVID positive. EXAM: PORTABLE CHEST 1 VIEW COMPARISON:  Chest x-ray  dated October 02, 2016. FINDINGS: The heart size and mediastinal contours are  within normal limits. Both lungs are clear. The visualized skeletal structures are unremarkable. IMPRESSION: No active disease. Electronically Signed   By: Titus Dubin M.D.   On: 07/31/2019 09:52    Procedures Procedures (including critical care time)  Medications Ordered in ED Medications  ibuprofen (ADVIL) tablet 600 mg (has no administration in time range)  albuterol (VENTOLIN HFA) 108 (90 Base) MCG/ACT inhaler 2 puff (2 puffs Inhalation Given 07/31/19 0936)  LORazepam (ATIVAN) tablet 1 mg (1 mg Oral Given 07/31/19 0936)  albuterol (VENTOLIN HFA) 108 (90 Base) MCG/ACT inhaler 2 puff (2 puffs Inhalation Given by Other 07/31/19 1054)    ED Course  I have reviewed the triage vital signs and the nursing notes.  Pertinent labs & imaging results that were available during my care of the patient were reviewed by me and considered in my medical decision making (see chart for details).    MDM Rules/Calculators/A&P                     68 year old patient with Covid symptoms x1 week.  Reports worsening of his shortness of breath a few days ago and increased anxiety secondary to this.  Denies any chest pain or pleuritic pain.  Denies bilateral lower extremity swelling.  No history of heart failure.  Vital signs are reassuring.  He is well-appearing on exam.  He is not tachypneic.  He is speaking in full sentences.  Faint bibasilar rales. Heart with RRR. No peripheral edema.  I personally ambulated the patient in the room for several minutes on pulse ox and he remained at 93 to 94% on room air.  He did not feel tachypneic with this and states he actually felt better when he was ambulating.  We will obtain labs, chest x-ray, give albuterol and Ativan per patient's request for something for anxiety. CBC wnl BMP nonacute  EKG with NSR, Left axis deviation  CXR without infiltrates or other abnormalities  Pt given  albuterol and ativan. On reassessment the patient states he is feeling much better after ativan. He denies any sob. He is hovering around 91-94% (mainly staying at 92-93%) on RA. He has no tachypnea. I had a long discussion with him regarding admission versus very close outpatient follow up. I did offer admission however patient would like to monitor sxs at home at this time.   I will discharge him with an albuterol inhaler, cough medication, and atarax for anxiety.  I advised him to get an over-the-counter pulse ox machine so that he can measure his oxygen sats at home.  He will need to return for any decrease in oxygenation or any worsening of his symptoms.  Patient appears reliable to follow-up closely with PCP and return if his symptoms worsen.  He voices understanding of the plan and reasons to return.  All questions answered.  Patient stable for discharge.  -----  Jonathan Yu was evaluated in Emergency Department on 07/31/2019 for the symptoms described in the history of present illness. He was evaluated in the context of the global COVID-19 pandemic, which necessitated consideration that the patient might be at risk for infection with the SARS-CoV-2 virus that causes COVID-19. Institutional protocols and algorithms that pertain to the evaluation of patients at risk for COVID-19 are in a state of rapid change based on information released by regulatory bodies including the CDC and federal and state organizations. These policies and algorithms were followed during the patient's care in the ED.  Case discussed with Dr.  Ralene Bathe who is in agreement with this plan.   Final Clinical Impression(s) / ED Diagnoses Final diagnoses:  COVID-19  SOB (shortness of breath)  Anxiety    Rx / DC Orders ED Discharge Orders         Ordered    benzonatate (TESSALON) 100 MG capsule  Every 8 hours     07/31/19 1108    hydrOXYzine (ATARAX/VISTARIL) 25 MG tablet  2 times daily     07/31/19 [address], Norma Montemurro S, PA-C 07/31/19 1109    Kimiyah Blick S, PA-C 07/31/19 1117    Quintella Reichert, MD 07/31/19 1451

## 2019-07-31 NOTE — Discharge Instructions (Addendum)
You were given an albuterol inhaler to help with your breathing.  Take 2 puffs every 4-6 hours as needed for shortness of breath and coughing.  You are also given a prescription for Tessalon Perles to help with the cough as well.  You are given a prescription for hydroxyzine to help with anxiety.  Do not drive, work, or operate machinery while taking this medication as it can make you very sleepy.   Please buy a pulse ox machine and monitor your oxygen at home.  If your oxygen drops below 92% then you will need to return to the emergency department for reassessment.  Please call your regular doctor on Monday, 05/03/2019 to schedule an appointment for follow-up.  Return to the emergency department for any new or worsening symptoms in the meantime.

## 2019-08-02 ENCOUNTER — Ambulatory Visit (INDEPENDENT_AMBULATORY_CARE_PROVIDER_SITE_OTHER): Payer: Medicare PPO | Admitting: Osteopathic Medicine

## 2019-08-02 VITALS — BP 104/77 | HR 78 | Temp 98.8°F | Wt 255.0 lb

## 2019-08-02 DIAGNOSIS — F4323 Adjustment disorder with mixed anxiety and depressed mood: Secondary | ICD-10-CM

## 2019-08-02 MED ORDER — CLONAZEPAM 0.5 MG PO TABS: 0.5000 mg | ORAL_TABLET | Freq: Three times a day (TID) | ORAL | 0 refills | Status: DC | PRN

## 2019-08-02 NOTE — Progress Notes (Signed)
Virtual Visit via Video (App used: Doximity) Note  I connected with      Jonathan Yu on 08/02/19 at 4:35 PM  by a telemedicine application and verified that I am speaking with the correct person using two identifiers.  Patient is at home I am in office   I discussed the limitations of evaluation and management by telemedicine and the availability of in person appointments. The patient expressed understanding and agreed to proceed.  History of Present Illness: Jonathan Yu is a 68 y.o. male who would like to discuss Anxiety   07/31/19 ER visit for SOB (+)COVID 07/24/19 (+)COVID Patient has been experiencing significant anxiety since Covid diagnosis, he reports that this state of mind is very out of character for him.  He is having great difficulty sleeping.  He is very nervous about cough/shortness of breath.  Depression screen [facility] 2/9 08/02/2019 06/03/2019 04/16/2017  Decreased Interest 3 0 0  Down, Depressed, Hopeless 3 0 0  PHQ - 2 Score 6 0 0  Altered sleeping 3 0 -  Tired, decreased energy 3 1 -  Change in appetite 3 1 -  Feeling bad or failure about yourself  3 0 -  Trouble concentrating 3 0 -  Moving slowly or fidgety/restless 3 0 -  Suicidal thoughts 3 0 -  PHQ-9 Score 27 2 -  Difficult doing work/chores Very difficult Not difficult at all -   GAD 7 : Generalized Anxiety Score 08/02/2019 06/03/2019  Nervous, Anxious, on Edge 3 1  Control/stop worrying 3 0  Worry too much - different things 3 0  Trouble relaxing 3 0  Restless 3 0  Easily annoyed or irritable 3 0  Afraid - awful might happen 3 0  Total GAD 7 Score 21 1  Anxiety Difficulty Very difficult Not difficult at all      Observations/Objective: BP 104/77   Pulse 78   Temp 98.8 F (37.1 C) (Oral)   Wt 255 lb (115.7 kg)   SpO2 92%   BMI 41.16 kg/m  BP Readings from Last 3 Encounters:  08/02/19 104/77  07/31/19 98/68  07/26/19 128/76   Exam: Normal Speech.  Alert and oriented x3, no  apparent distress though patient does get a bit tearful when talking about his significant anxiety symptoms/state of mind.  No thought disorder.  Lab and Radiology Results Results for orders placed or performed during the hospital encounter of 07/31/19 (from the past 72 hour(s))  CBC with Differential     Status: None   Collection Time: 07/31/19  9:49 AM  Result Value Ref Range   WBC 7.4 4.0 - 10.5 K/uL   RBC 5.67 4.22 - 5.81 MIL/uL   Hemoglobin 16.6 13.0 - 17.0 g/dL   HCT 49.4 39.0 - 52.0 %   MCV 87.1 80.0 - 100.0 fL   MCH 29.3 26.0 - 34.0 pg   MCHC 33.6 30.0 - 36.0 g/dL   RDW 12.0 11.5 - 15.5 %   Platelets 201 150 - 400 K/uL   nRBC 0.0 0.0 - 0.2 %   Neutrophils Relative % 79 %   Neutro Abs 5.8 1.7 - 7.7 K/uL   Lymphocytes Relative 13 %   Lymphs Abs 1.0 0.7 - 4.0 K/uL   Monocytes Relative 7 %   Monocytes Absolute 0.5 0.1 - 1.0 K/uL   Eosinophils Relative 0 %   Eosinophils Absolute 0.0 0.0 - 0.5 K/uL   Basophils Relative 0 %   Basophils Absolute 0.0 0.0 -  0.1 K/uL   Immature Granulocytes 1 %   Abs Immature Granulocytes 0.05 0.00 - 0.07 K/uL    Comment: Performed at [facility], [address] [place], [place] [id number]  Basic metabolic panel     Status: Abnormal   Collection Time: 07/31/19  9:49 AM  Result Value Ref Range   Sodium 135 135 - 145 mmol/L   Potassium 3.7 3.5 - 5.1 mmol/L   Chloride 103 98 - 111 mmol/L   CO2 21 (L) 22 - 32 mmol/L   Glucose, Bld 154 (H) 70 - 99 mg/dL   BUN 17 8 - 23 mg/dL   Creatinine, Ser 0.92 0.61 - 1.24 mg/dL   Calcium 8.9 8.9 - 10.3 mg/dL   GFR calc non Af Amer >60 >60 mL/min   GFR calc Af Amer >60 >60 mL/min   Anion gap 11 5 - 15    Comment: Performed at [facility], [address] [place], [place] [address]   DG Chest Portable 1 View  Result Date: 07/31/2019 CLINICAL DATA:  Cough and shortness of breath.  COVID positive. EXAM: PORTABLE CHEST 1 VIEW COMPARISON:  Chest x-ray dated October 02, 2016. FINDINGS:  The heart size and mediastinal contours are within normal limits. Both lungs are clear. The visualized skeletal structures are unremarkable. IMPRESSION: No active disease. Electronically Signed   By: Titus Dubin M.D.   On: 07/31/2019 09:52       Assessment and Plan: 68 y.o. male with The encounter diagnosis was Situational mixed anxiety and depressive disorder.  Gust with patient I think okay to trial short course of twice daily/3 times daily benzodiazepine, with caution that we will want to taper off of this starting next week given the addictive potential of these medications.  No history of mental illness/generalized anxiety disorder.  This sounds like a purely situational anxiety disorder though is quite severe, would have a low threshold for referring for therapy.  Patient was advised to call us in 2 days to let me know how the medications are working for him, if absolutely needed we could probably increase dosage but I would not want to do this unless absolutely needed.  Patient has good family support, he was advised to contact emergency services if he feels he is in mental health crisis after hours.   PDMP not reviewed this encounter. No orders of the defined types were placed in this encounter.  Meds ordered this encounter  Medications  . clonazePAM (KLONOPIN) 0.5 MG tablet    Sig: Take 1 tablet (0.5 mg total) by mouth 3 (three) times daily as needed for anxiety.    Dispense:  30 tablet    Refill:  0       Follow Up Instructions: No follow-ups on file.    I discussed the assessment and treatment plan with the patient. The patient was provided an opportunity to ask questions and all were answered. The patient agreed with the plan and demonstrated an understanding of the instructions.   The patient was advised to call back or seek an in-person evaluation if any new concerns, if symptoms worsen or if the condition fails to improve as anticipated.  25 minutes of  non-face-to-face time was provided during this encounter.      . . . . . . . . . . . . . Marland Kitchen                   Historical information  moved to improve visibility of documentation.  Past Medical History:  Diagnosis Date  . Allergy   . DDD (degenerative disc disease), lumbar   . Hyperlipidemia    "no meds since losing 106# 2 yr ago" (10/06/2013)  . Hypertension 2012   "no meds since losing 106# 2 yr ago" (10/06/2013)  . OSA on CPAP    "don't wear mask much since losing 106# 2 yr ago" (10/06/2013)  . Type II diabetes mellitus ([place])    "no meds since losing 106# 2 yr ago" (10/06/2013)  . Walking pneumonia 1968   Past Surgical History:  Procedure Laterality Date  . ANKLE FRACTURE SURGERY Left 1973  . ANKLE RECONSTRUCTION Right 05/2011  . CARPAL TUNNEL RELEASE Bilateral 2001  . HARDWARE REMOVAL Right 10/07/2013   Procedure: RIGHT ANKLE HARDWARE REMOVAL;  Surgeon: Newt Minion, MD;  Location: [place];  Service: Orthopedics;  Laterality: Right;  . I & D EXTREMITY Right 11/18/2013   Procedure: IRRIGATION AND DEBRIDEMENT EXTREMITY;  Surgeon: Newt Minion, MD;  Location: [facility];  Service: Orthopedics;  Laterality: Right;  Irrigation and Debridement Right Fibula , Place Antibiotic Beads and VAC  . NASAL SEPTUM SURGERY  1982  . ORIF ANKLE FRACTURE Right 09/08/2013   Procedure: REPAIR SYNDESMOSIS DISRUPTION RIGHT ANKLE;  Surgeon: Newt Minion, MD;  Location: [place];  Service: Orthopedics;  Laterality: Right;  . SHOULDER ARTHROSCOPY W/ ROTATOR CUFF REPAIR Right 2006; 2009  . TONSILLECTOMY  1959  . TOTAL ANKLE ARTHROPLASTY Right 08/18/2013   Procedure: TOTAL ANKLE ARTHOPLASTY;  Surgeon: Newt Minion, MD;  Location: [place];  Service: Orthopedics;  Laterality: Right;  Right Total Ankle Arthroplasty, Revision Fibular Fracture  . URETERAL STENT PLACEMENT  ~ 2010 X 2   Social History   Tobacco Use  . Smoking status: Current Some Day Smoker    Years: 6.00    Types: Pipe,  Cigars  . Smokeless tobacco: Never Used  . Tobacco comment: 10/06/2013 "use pipe or cigar 1-2 times/month"  Substance Use Topics  . Alcohol use: Yes    Comment: 10/06/2013 "have 4-5 drinks/yr"   family history includes Alzheimer's disease in his father; Cancer (age of onset: 75) in his mother; Cleft lip in his sister; Parkinson's disease in his father; Scoliosis in his sister.  Medications: Current Outpatient Medications  Medication Sig Dispense Refill  . albuterol (PROVENTIL HFA;VENTOLIN HFA) 108 (90 Base) MCG/ACT inhaler Inhale 2 puffs into the lungs every 6 (six) hours as needed for wheezing or shortness of breath. 1 Inhaler 0  . AMBULATORY NON FORMULARY MEDICATION Continue CPAP at current settings.  Please provide new supplies. Micheal Likens phone number [phone] 1 each 0  . AMBULATORY NON FORMULARY MEDICATION Compression stockings.  Vive size LGM Leg swelling. Disp 1 pair 99 refil. 1 each 99  . atorvastatin (LIPITOR) 40 MG tablet Take 1 tablet (40 mg total) by mouth daily. 90 tablet 3  . Azelaic Acid 15 % cream Apply 1 application topically 2 (two) times daily. 50 g 12  . benzonatate (TESSALON) 100 MG capsule Take 1 capsule (100 mg total) by mouth every 8 (eight) hours for 5 days. 15 capsule 0  . EQ ALLERGY RELIEF, CETIRIZINE, 10 MG tablet Take 1 tablet by mouth once daily 90 tablet 0  . ibuprofen (ADVIL,MOTRIN) 200 MG tablet Take 200 mg by mouth every 6 (six) hours as needed.    . loratadine (CLARITIN) 10 MG tablet Take 10 mg by mouth daily.    Marland Kitchen  losartan (COZAAR) 50 MG tablet Take 1 tablet (50 mg total) by mouth daily. 90 tablet 3  . Misc. Devices (FLEX THERAPY) MISC by Does not apply route.    . Multiple Vitamin (MULTIVITAMIN WITH MINERALS) TABS tablet Take 1 tablet by mouth daily.    . naproxen sodium (ALEVE) 220 MG tablet Take 220 mg by mouth 2 (two) times daily with a meal.    . Omega-3 Fatty Acids (FISH OIL PO) Take 1 capsule by mouth daily.    Marland Kitchen OVER THE COUNTER  MEDICATION Take 1 tablet by mouth 4 (four) times daily - after meals and at bedtime. -pH balance formula Alkolete Yoli    . predniSONE (DELTASONE) 50 MG tablet Take one tablet for 5 days. 5 tablet 0  . tadalafil (CIALIS) 20 MG tablet TAKE HALF TO ONE TABLET BY MOUTH EVERY OTHER DAY AS NEEDED FOR ERECTILE DYSFUNCTION 30 tablet 12  . tamsulosin (FLOMAX) 0.4 MG CAPS capsule Take 1 capsule (0.4 mg total) by mouth daily after breakfast. 90 capsule 3  . clonazePAM (KLONOPIN) 0.5 MG tablet Take 1 tablet (0.5 mg total) by mouth 3 (three) times daily as needed for anxiety. 30 tablet 0   No current facility-administered medications for this visit.   Allergies  Allergen Reactions  . Sesame Oil Hives and Shortness Of Breath  . Percocet [Oxycodone-Acetaminophen] Rash  . Topamax [Topiramate]     Cognitive decline  . Vicodin [Hydrocodone-Acetaminophen] Rash

## 2019-08-03 ENCOUNTER — Telehealth: Payer: Self-pay

## 2019-08-03 ENCOUNTER — Telehealth: Payer: Self-pay | Admitting: Osteopathic Medicine

## 2019-08-03 ENCOUNTER — Emergency Department (HOSPITAL_BASED_OUTPATIENT_CLINIC_OR_DEPARTMENT_OTHER)
Admission: EM | Admit: 2019-08-03 | Discharge: 2019-08-03 | Discharge status: Against Medical Advice | Payer: Medicare PPO

## 2019-08-03 ENCOUNTER — Other Ambulatory Visit: Payer: Self-pay

## 2019-08-03 DIAGNOSIS — U071 COVID-19: Secondary | ICD-10-CM

## 2019-08-03 DIAGNOSIS — R059 Cough, unspecified: Secondary | ICD-10-CM

## 2019-08-03 DIAGNOSIS — R05 Cough: Secondary | ICD-10-CM

## 2019-08-03 MED ORDER — AMBULATORY NON FORMULARY MEDICATION: 99 refills | Status: DC

## 2019-08-03 NOTE — Telephone Encounter (Signed)
Patient called the office this morning and spoke to [facility] in Triage and she advised him to go to the ER and after going to the ER- this is what he called back to state: His o2 is staying in high 80's sometimes going into low 90's. He went to the ED again today but the wait was so long and his SATS were decent there so they didn't fully check him in. They did suggest he call his PCP to see if temporary home oxygen supplementation could be ordered. Can you please send a note back to triage or to Dr. Sheppard Coil or Luvenia Starch.

## 2019-08-03 NOTE — ED Notes (Signed)
Pt NAD seated in ED WR-states he has decided to not be seen

## 2019-08-03 NOTE — Telephone Encounter (Signed)
Just to clarify, and as FYI for patient, the ER is not permitted to turn patients away without a formal evaluation by a qualified medical provider, if he left on his own after discussing the wait times, that his decision.  We can certainly try home oxygen, this might also be something for convalescent plasma infusion?  Can we take care of this?   Routed to Rachel/triage -not sure what kind of ordering need to put in for home oxygen, does this need to go to home health?  If so, can do supplemental oxygen 2 L/min as needed for shortness of breath

## 2019-08-03 NOTE — Telephone Encounter (Signed)
Orders placed. Faxed to [facility] stat

## 2019-08-03 NOTE — Telephone Encounter (Signed)
Patient called, states he woke up feeling very bad today. States his o2 was in the 18s, highest he can get o2 is 85.  No fever. Shortness of breath, struggling with breaths.   Patient advised to go to ER, pt was agreeable. His son will take him. Provider wjo saw pt yesterday was advised.

## 2019-08-03 NOTE — Addendum Note (Signed)
Addended by: Towana Badger on: 08/03/2019 04:33 PM   Modules accepted: Orders

## 2019-08-03 NOTE — ED Notes (Signed)
Reg clerk advised that pt states "his oxygen level is better so he's gonna wait to see if he wants to be seen"

## 2019-08-04 ENCOUNTER — Ambulatory Visit: Payer: Medicare PPO

## 2019-08-04 DIAGNOSIS — U071 COVID-19: Secondary | ICD-10-CM | POA: Diagnosis not present

## 2019-08-04 NOTE — Telephone Encounter (Signed)
Left msg on infusion hotline for pt to get set up

## 2019-08-07 ENCOUNTER — Telehealth: Payer: Self-pay | Admitting: Sports Medicine

## 2019-08-07 DIAGNOSIS — U071 COVID-19: Secondary | ICD-10-CM | POA: Insufficient documentation

## 2019-08-07 MED ORDER — ALPRAZOLAM 0.25 MG PO TABS: 0.2500 mg | ORAL_TABLET | Freq: Two times a day (BID) | ORAL | 0 refills | Status: DC | PRN

## 2019-08-07 MED ORDER — DEXAMETHASONE 4 MG PO TABS: 4.0000 mg | ORAL_TABLET | Freq: Three times a day (TID) | ORAL | 0 refills | Status: DC

## 2019-08-07 MED ORDER — CLONAZEPAM 0.5 MG PO TABS: 0.5000 mg | ORAL_TABLET | Freq: Three times a day (TID) | ORAL | 0 refills | Status: DC | PRN

## 2019-08-07 NOTE — Telephone Encounter (Signed)
Patient initially said that hydroxyzine gives him hives, he has contacted me again and told me that clonazepam actually gives him hives and that he would prefer alprazolam.  Sending in a short prescription for alprazolam with notes to cancel prescription for clonazepam.  Added to allergy list.

## 2019-08-07 NOTE — Telephone Encounter (Addendum)
68 year old male with multiple comorbidities, diagnosed with COVID-19 approximately 2 weeks ago.  He had some increasing shortness of breath, was placed on Covid and Augmentin by Iran Planas, PA-C.  Unfortunately continues to have mild shortness of breath, no more fevers, he is on 2 L of oxygen nasal cannula at home, O2 sats around 95%.  Vitals otherwise stable.  CXR clear recently.  He is speaking full sentences on the phone, no pauses, he is feeling severe/significant anxiety, still feels extremely fatigued, mild cough.  It sounds like he did some prednisone, we are going to switch him to Decadron for 5 days, adding a bit of alprazolam, he has significant anxiety related to this as well but I did explain to him that though clonazepam did not really suppress respiratory drive it was something that we generally avoid in respiratory illness.  Calling in low-dose, 0.5 mg for use up to twice daily or 3 times daily, #10.  As I think the principal issue here is anxiety, I do not think we need to consider antibiotic infusions, hospitalization, or other advanced therapies in the situation.  Should his symptoms not be significantly improved by the beginning or middle of this coming week, considering his positive test was 14 days ago he has likely stop shedding virus and would be safe to see in the office, I would be happy to see him.

## 2019-08-07 NOTE — Assessment & Plan Note (Addendum)
68 year old male with multiple comorbidities, diagnosed with COVID-19 approximately 2 weeks ago.  He had some increasing shortness of breath, was placed on Covid and Augmentin by Iran Planas, PA-C.  Unfortunately continues to have mild shortness of breath, no more fevers, he is on 2 L of oxygen nasal cannula at home, O2 sats around 95%.  Vitals otherwise stable.  CXR clear recently.  He is speaking full sentences on the phone, no pauses, he is feeling severe/significant anxiety, still feels extremely fatigued, mild cough.  It sounds like he did some prednisone, we are going to switch him to Decadron for 5 days, adding a bit of alprazolam, he has significant anxiety related to this as well but I did explain to him that though clonazepam did not really suppress respiratory drive it was something that we generally avoid in respiratory illness.  Calling in low-dose, 0.5 mg for use up to twice daily or 3 times daily, #10.  As I think the principal issue here is anxiety, I do not think we need to consider antibiotic infusions, hospitalization, or other advanced therapies in the situation.  Should his symptoms not be significantly improved by the beginning or middle of this coming week, considering his positive test was 14 days ago he has likely stop shedding virus and would be safe to see in the office, I would be happy to see him.  Patient initially said that hydroxyzine gives him hives, he has contacted me again and told me that clonazepam actually gives him hives and that he would prefer alprazolam.  Sending in a short prescription for alprazolam with notes to cancel prescription for clonazepam.  Added to allergy list.

## 2019-08-07 NOTE — Addendum Note (Signed)
Addended by: Silverio Decamp on: 08/07/2019 08:47 PM   Modules accepted: Orders

## 2019-08-14 DIAGNOSIS — G4733 Obstructive sleep apnea (adult) (pediatric): Secondary | ICD-10-CM | POA: Diagnosis not present

## 2019-08-16 ENCOUNTER — Encounter: Payer: Self-pay | Admitting: Physician Assistant

## 2019-08-16 ENCOUNTER — Ambulatory Visit (INDEPENDENT_AMBULATORY_CARE_PROVIDER_SITE_OTHER): Payer: Medicare PPO | Admitting: Physician Assistant

## 2019-08-16 ENCOUNTER — Other Ambulatory Visit: Payer: Self-pay

## 2019-08-16 VITALS — BP 139/79 | HR 68 | Temp 98.4°F | Resp 16

## 2019-08-16 DIAGNOSIS — E291 Testicular hypofunction: Secondary | ICD-10-CM

## 2019-08-16 DIAGNOSIS — F411 Generalized anxiety disorder: Secondary | ICD-10-CM | POA: Diagnosis not present

## 2019-08-16 DIAGNOSIS — H6983 Other specified disorders of Eustachian tube, bilateral: Secondary | ICD-10-CM

## 2019-08-16 DIAGNOSIS — I10 Essential (primary) hypertension: Secondary | ICD-10-CM

## 2019-08-16 DIAGNOSIS — Z8616 Personal history of COVID-19: Secondary | ICD-10-CM | POA: Diagnosis not present

## 2019-08-16 MED ORDER — TESTOSTERONE CYPIONATE 200 MG/ML IM SOLN
200.0000 mg | Freq: Once | INTRAMUSCULAR | Status: AC
  Administered 2019-08-16: 200 mg via INTRAMUSCULAR

## 2019-08-16 NOTE — Patient Instructions (Addendum)
For your blood pressure: - Goal <130/80 (Ideally 120's/70's) - consider taking baby aspirin 81 mg daily to help prevent heart attack/stroke if you have risk factors (males over age 67, diabetes, family history) - monitor and log blood pressures at home - check around the same time each day in a relaxed setting - Limit salt to <2500 mg/day - Follow DASH (Dietary Approach to Stopping Hypertension) eating plan - Try to get at least 150 minutes of aerobic exercise per week - Aim to go on a brisk walk 30 minutes per day at least 5 days per week. If you're not active, gradually increase how long you walk by 5 minutes each week - limit alcohol: 2 standard drinks per day for men and 1 per day for women - avoid tobacco/nicotine products. Consider smoking cessation if you smoke - weight loss: 7% of current body weight can reduce your blood pressure by 5-10 points - follow-up at least every 6 months for your blood pressure. Follow-up sooner if your BP is not controlled   Eustachian Tube Dysfunction  Eustachian tube dysfunction refers to a condition in which a blockage develops in the narrow passage that connects the middle ear to the back of the nose (eustachian tube). The eustachian tube regulates air pressure in the middle ear by letting air move between the ear and nose. It also helps to drain fluid from the middle ear space. Eustachian tube dysfunction can affect one or both ears. When the eustachian tube does not function properly, air pressure, fluid, or both can build up in the middle ear. What are the causes? This condition occurs when the eustachian tube becomes blocked or cannot open normally. Common causes of this condition include:  Ear infections.  Colds and other infections that affect the nose, mouth, and throat (upper respiratory tract).  Allergies.  Irritation from cigarette smoke.  Irritation from stomach acid coming up into the esophagus (gastroesophageal reflux). The esophagus  is the tube that carries food from the mouth to the stomach.  Sudden changes in air pressure, such as from descending in an airplane or scuba diving.  Abnormal growths in the nose or throat, such as: ? Growths that line the nose (nasal polyps). ? Abnormal growth of cells (tumors). ? Enlarged tissue at the back of the throat (adenoids). What increases the risk? You are more likely to develop this condition if:  You smoke.  You are overweight.  You are a child who has: ? Certain birth defects of the mouth, such as cleft palate. ? Large tonsils or adenoids. What are the signs or symptoms? Common symptoms of this condition include:  A feeling of fullness in the ear.  Ear pain.  Clicking or popping noises in the ear.  Ringing in the ear.  Hearing loss.  Loss of balance.  Dizziness. Symptoms may get worse when the air pressure around you changes, such as when you travel to an area of high elevation, fly on an airplane, or go scuba diving. How is this diagnosed? This condition may be diagnosed based on:  Your symptoms.  A physical exam of your ears, nose, and throat.  Tests, such as those that measure: ? The movement of your eardrum (tympanogram). ? Your hearing (audiometry). How is this treated? Treatment depends on the cause and severity of your condition.  In mild cases, you may relieve your symptoms by moving air into your ears. This is called "popping the ears."  In more severe cases, or if you have  symptoms of fluid in your ears, treatment may include: ? Medicines to relieve congestion (decongestants). ? Medicines that treat allergies (antihistamines). ? Nasal sprays or ear drops that contain medicines that reduce swelling (steroids). ? A procedure to drain the fluid in your eardrum (myringotomy). In this procedure, a small tube is placed in the eardrum to:  Drain the fluid.  Restore the air in the middle ear space. ? A procedure to insert a balloon device  through the nose to inflate the opening of the eustachian tube (balloon dilation). Follow these instructions at home: Lifestyle  Do not do any of the following until your health care provider approves: ? Travel to high altitudes. ? Fly in airplanes. ? Work in a Pension scheme manager or room. ? Scuba dive.  Do not use any products that contain nicotine or tobacco, such as cigarettes and e-cigarettes. If you need help quitting, ask your health care provider.  Keep your ears dry. Wear fitted earplugs during showering and bathing. Dry your ears completely after. General instructions  Take over-the-counter and prescription medicines only as told by your health care provider.  Use techniques to help pop your ears as recommended by your health care provider. These may include: ? Chewing gum. ? Yawning. ? Frequent, forceful swallowing. ? Closing your mouth, holding your nose closed, and gently blowing as if you are trying to blow air out of your nose.  Keep all follow-up visits as told by your health care provider. This is important. Contact a health care provider if:  Your symptoms do not go away after treatment.  Your symptoms come back after treatment.  You are unable to pop your ears.  You have: ? A fever. ? Pain in your ear. ? Pain in your head or neck. ? Fluid draining from your ear.  Your hearing suddenly changes.  You become very dizzy.  You lose your balance. Summary  Eustachian tube dysfunction refers to a condition in which a blockage develops in the eustachian tube.  It can be caused by ear infections, allergies, inhaled irritants, or abnormal growths in the nose or throat.  Symptoms include ear pain, hearing loss, or ringing in the ears.  Mild cases are treated with maneuvers to unblock the ears, such as yawning or ear popping.  Severe cases are treated with medicines. Surgery may also be done (rare). This information is not intended to replace advice given to  you by your health care provider. Make sure you discuss any questions you have with your health care provider. Document Revised: 11/18/2017 Document Reviewed: 11/18/2017 Elsevier Patient Education  [employer].

## 2019-08-16 NOTE — Progress Notes (Signed)
Acute Office Visit  Subjective:    Patient ID: Jonathan Yu, male    DOB: July 03, 1951, 69 y.o.   MRN: [id number]  No chief complaint on file.   HPI Patient with PMH of prediabetes (hx of Type 2 DM), OSA on CPAP, diet-controlled HTN is in today for COVID-19 follow-up  Symptom start date 12/11, tested positive 12/12  He was evaluated in ED for SOB on 12/19, had a negative CXR and was discharged home. No hospitalization or oxygen requirement. Treated with Prednisone burst on 12/14, completed 5 day burst Treated again with Dexamethasone 4 mg tid for 5 days on 08/07/19, completed therapy.  He has been taking Alprazolam 0.25 mg QHS for insomnia 2/2 anxiety related to COVID-19 infection. Of note, he has not had his testosterone dose in the last 6 weeks (he is on testosterone cypionate 200 mg every 3 weeks). He denies depressed mood or significant mood changes.   He reports he continues to have residual nasal congestion and blood-tinged mucus when blowing nose. He also has had right-sided ear fullness for last 5 days, gradually improving. He was having left-sided ear fullness prior that has resolved. Denies hearing loss or otalgia. He was taking Mucinex,but has not needed this for the last couple of days. He is using neti pot. Reports for the last 2 days he has felt the healthiest since he first became sick.  He is using Albuterol 1-2 puffs twice a day "just because" and does not feel he has significant SOB  He self-discontinued most of his maintenance medications 2 weeks ago. Has not been taking Losartan for the last 2 weeks because he was feeling lightheaded. He has been monitoring BP at home and range is 98-120's/60's-70's without medication He plans to resume his Flomax. He does not wish to resume Atorvastatin or Losartan.  Past Medical History:  Diagnosis Date  . Allergy   . DDD (degenerative disc disease), lumbar   . Hyperlipidemia    "no meds since losing 106# 2 yr ago" (10/06/2013)   . Hypertension 2012   "no meds since losing 106# 2 yr ago" (10/06/2013)  . OSA on CPAP    "don't wear mask much since losing 106# 2 yr ago" (10/06/2013)  . Type II diabetes mellitus ([place])    "no meds since losing 106# 2 yr ago" (10/06/2013)  . Walking pneumonia 1968    Past Surgical History:  Procedure Laterality Date  . ANKLE FRACTURE SURGERY Left 1973  . ANKLE RECONSTRUCTION Right 05/2011  . CARPAL TUNNEL RELEASE Bilateral 2001  . HARDWARE REMOVAL Right 10/07/2013   Procedure: RIGHT ANKLE HARDWARE REMOVAL;  Surgeon: Newt Minion, MD;  Location: [facility];  Service: Orthopedics;  Laterality: Right;  . I & D EXTREMITY Right 11/18/2013   Procedure: IRRIGATION AND DEBRIDEMENT EXTREMITY;  Surgeon: Newt Minion, MD;  Location: [facility];  Service: Orthopedics;  Laterality: Right;  Irrigation and Debridement Right Fibula , Place Antibiotic Beads and VAC  . NASAL SEPTUM SURGERY  1982  . ORIF ANKLE FRACTURE Right 09/08/2013   Procedure: REPAIR SYNDESMOSIS DISRUPTION RIGHT ANKLE;  Surgeon: Newt Minion, MD;  Location: [facility];  Service: Orthopedics;  Laterality: Right;  . SHOULDER ARTHROSCOPY W/ ROTATOR CUFF REPAIR Right 2006; 2009  . TONSILLECTOMY  1959  . TOTAL ANKLE ARTHROPLASTY Right 08/18/2013   Procedure: TOTAL ANKLE ARTHOPLASTY;  Surgeon: Newt Minion, MD;  Location: [facility];  Service: Orthopedics;  Laterality: Right;  Right Total Ankle Arthroplasty, Revision Fibular Fracture  .  URETERAL STENT PLACEMENT  ~ 2010 X 2    Family History  Problem Relation Age of Onset  . Cancer Mother 56       Lung  . Alzheimer's disease Father   . Parkinson's disease Father   . Scoliosis Sister   . Cleft lip Sister     Social History   Socioeconomic History  . Marital status: Divorced    Spouse name: Not on file  . Number of children: 5  . Years of education: Not on file  . Highest education level: Not on file  Occupational History  . Occupation: sales/ DJ  Tobacco Use  . Smoking status: Current Some Day  Smoker    Years: 6.00    Types: Pipe, Cigars  . Smokeless tobacco: Never Used  . Tobacco comment: 10/06/2013 "use pipe or cigar 1-2 times/month"  Substance and Sexual Activity  . Alcohol use: Yes    Comment: 10/06/2013 "have 4-5 drinks/yr"  . Drug use: No  . Sexual activity: Yes    Comment: 1 every 2 months  Other Topics Concern  . Not on file  Social History Narrative  . Not on file   Social Determinants of Health   Financial Resource Strain:   . Difficulty of Paying Living Expenses: Not on file  Food Insecurity:   . Worried About Charity fundraiser in the Last Year: Not on file  . Ran Out of Food in the Last Year: Not on file  Transportation Needs:   . Lack of Transportation (Medical): Not on file  . Lack of Transportation (Non-Medical): Not on file  Physical Activity:   . Days of Exercise per Week: Not on file  . Minutes of Exercise per Session: Not on file  Stress:   . Feeling of Stress : Not on file  Social Connections:   . Frequency of Communication with Friends and Family: Not on file  . Frequency of Social Gatherings with Friends and Family: Not on file  . Attends Religious Services: Not on file  . Active Member of Clubs or Organizations: Not on file  . Attends Archivist Meetings: Not on file  . Marital Status: Not on file  Intimate Partner Violence:   . Fear of Current or Ex-Partner: Not on file  . Emotionally Abused: Not on file  . Physically Abused: Not on file  . Sexually Abused: Not on file    Outpatient Medications Prior to Visit  Medication Sig Dispense Refill  . albuterol (PROVENTIL HFA;VENTOLIN HFA) 108 (90 Base) MCG/ACT inhaler Inhale 2 puffs into the lungs every 6 (six) hours as needed for wheezing or shortness of breath. 1 Inhaler 0  . ALPRAZolam (XANAX) 0.25 MG tablet Take 1 tablet (0.25 mg total) by mouth 2 (two) times daily as needed for anxiety. 10 tablet 0  . AMBULATORY NON FORMULARY MEDICATION Continue CPAP at current settings.   Please provide new supplies. Micheal Likens phone number [phone] 1 each 0  . AMBULATORY NON FORMULARY MEDICATION Compression stockings.  Vive size LGM Leg swelling. Disp 1 pair 99 refil. 1 each 99  . AMBULATORY NON FORMULARY MEDICATION Oxygen- patient needing supplemental oxygen 2L/min as needed for shortness of breath *positive COVID diagnosis* 1 each 99  . Azelaic Acid 15 % cream Apply 1 application topically 2 (two) times daily. 50 g 12  . EQ ALLERGY RELIEF, CETIRIZINE, 10 MG tablet Take 1 tablet by mouth once daily 90 tablet 0  . ibuprofen (ADVIL,MOTRIN) 200 MG tablet Take  200 mg by mouth every 6 (six) hours as needed.    . loratadine (CLARITIN) 10 MG tablet Take 10 mg by mouth daily.    . Misc. Devices (FLEX THERAPY) MISC by Does not apply route.    . Multiple Vitamin (MULTIVITAMIN WITH MINERALS) TABS tablet Take 1 tablet by mouth daily.    . naproxen sodium (ALEVE) 220 MG tablet Take 220 mg by mouth 2 (two) times daily with a meal.    . Omega-3 Fatty Acids (FISH OIL PO) Take 1 capsule by mouth daily.    Marland Kitchen OVER THE COUNTER MEDICATION Take 1 tablet by mouth 4 (four) times daily - after meals and at bedtime. -pH balance formula Alkolete Yoli    . tadalafil (CIALIS) 20 MG tablet TAKE HALF TO ONE TABLET BY MOUTH EVERY OTHER DAY AS NEEDED FOR ERECTILE DYSFUNCTION 30 tablet 12  . tamsulosin (FLOMAX) 0.4 MG CAPS capsule Take 1 capsule (0.4 mg total) by mouth daily after breakfast. 90 capsule 3  . atorvastatin (LIPITOR) 40 MG tablet Take 1 tablet (40 mg total) by mouth daily. 90 tablet 3  . dexamethasone (DECADRON) 4 MG tablet Take 1 tablet (4 mg total) by mouth 3 (three) times daily. 15 tablet 0  . losartan (COZAAR) 50 MG tablet Take 1 tablet (50 mg total) by mouth daily. 90 tablet 3  . predniSONE (DELTASONE) 50 MG tablet Take one tablet for 5 days. 5 tablet 0   No facility-administered medications prior to visit.    Allergies  Allergen Reactions  . Sesame Oil Hives and Shortness Of  Breath  . Clonazepam Hives  . Percocet [Oxycodone-Acetaminophen] Rash  . Topamax [Topiramate]     Cognitive decline  . Vicodin [Hydrocodone-Acetaminophen] Rash    Review of Systems     Objective:    Physical Exam  BP 139/79   Pulse 68   Temp 98.4 F (36.9 C) (Oral)   Resp 16   SpO2 96%  Wt Readings from Last 3 Encounters:  08/02/19 255 lb (115.7 kg)  07/31/19 255 lb (115.7 kg)  07/26/19 260 lb (117.9 kg)  Gen:  alert, not ill-appearing, no distress, appropriate for age 79: head normocephalic without obvious abnormality, conjunctiva and cornea clear, external ear canals normal, hearing intact to finger rub, TM's pearly gray and semi-transparent, no pre- or post-auricular adenopathy Pulm: Normal work of breathing, normal phonation, clear to auscultation bilaterally, no wheezes, rales or rhonchi CV: Normal rate, regular rhythm, s1 and s2 distinct, no murmurs, clicks or rubs  Neuro: alert and oriented x 3, no tremor MSK: extremities atraumatic, normal gait and station Skin: intact, no rashes on exposed skin, no jaundice, no cyanosis Psych: well-groomed, cooperative, good eye contact, euthymic mood, affect mood-congruent, speech is articulate, and thought processes clear and goal-directed  There are no preventive care reminders to display for this patient.  There are no preventive care reminders to display for this patient.   Lab Results  Component Value Date   TSH 2.31 07/01/2017   Lab Results  Component Value Date   WBC 7.4 07/31/2019   HGB 16.6 07/31/2019   HCT 49.4 07/31/2019   MCV 87.1 07/31/2019   PLT 201 07/31/2019   Lab Results  Component Value Date   NA 135 07/31/2019   K 3.7 07/31/2019   CO2 21 (L) 07/31/2019   GLUCOSE 154 (H) 07/31/2019   BUN 17 07/31/2019   CREATININE 0.92 07/31/2019   BILITOT 2.3 (H) 04/22/2019   ALKPHOS 39 (L) 01/07/2017  AST 23 04/22/2019   ALT 34 04/22/2019   PROT 6.8 04/22/2019   ALBUMIN 3.5 (L) 01/07/2017   CALCIUM 8.9  07/31/2019   ANIONGAP 11 07/31/2019   Lab Results  Component Value Date   CHOL 122 04/22/2019   Lab Results  Component Value Date   HDL 40 04/22/2019   Lab Results  Component Value Date   LDLCALC 62 04/22/2019   Lab Results  Component Value Date   TRIG 118 04/22/2019   Lab Results  Component Value Date   CHOLHDL 3.1 04/22/2019   Lab Results  Component Value Date   HGBA1C 6.2 (H) 04/22/2019    The ASCVD Risk score Mikey Bussing DC Jr., et al., 2013) failed to calculate for the following reasons:   The valid total cholesterol range is 130 to 320 mg/dL   CLINICAL DATA:  Cough and shortness of breath.  COVID positive.  EXAM: PORTABLE CHEST 1 VIEW  COMPARISON:  Chest x-ray dated October 02, 2016.  FINDINGS: The heart size and mediastinal contours are within normal limits. Both lungs are clear. The visualized skeletal structures are unremarkable.  IMPRESSION: No active disease.   Electronically Signed   By: Titus Dubin M.D.   On: 07/31/2019 09:52    Assessment & Plan:   .Diagnoses and all orders for this visit:  History of 2019 novel coronavirus disease (COVID-19)  Hypogonadism in male -     testosterone cypionate (DEPOTESTOSTERONE CYPIONATE) injection 200 mg  Anxiety state  Eustachian tube dysfunction, bilateral  Essential hypertension, benign   1. Recent COVID-19 infection Clinically improved. Mild residual DOE. No tachypnea, hypoxia or adventitious lung sounds Negative CXR Labs dated 07/31/19 personally reviewed by me, no AKI or transaminitis. No indication for follow-up labs Cont Albuterol prn  2. Anxiety state Likely 2/2 toCOVID-19 infection as well as testosterone withdrawal Will likely improve with resuming testosterone replacement and clinical improvement of respiratory sx Cont alprazolam 0.25 mg QHS for the next 2 nights and then d/c Follow-up prn new or worsening symptoms  3. Hypogonadism Labs UTD Resume Q3week testosterone  injections 200 mg IM given in office today Follow-up nurse visit in 3 weeks  4. ETD No evidence of acute OM on exam Counseled on general measures and watchful waiting Follow-up prn  5. HTN Self-discontinued ARB 2 weeks ago BP in range at home, stage 1 HTN in office today Counseled to cont self-monitoring with BP goal <130/80 Contact PCP for BP>140/90 Counseled on therapeutic lifestyle changes Consider ASA 81 mg for primary prevention    Trixie Dredge, PA-C

## 2019-08-19 ENCOUNTER — Ambulatory Visit: Payer: Medicare PPO

## 2019-09-02 ENCOUNTER — Ambulatory Visit (INDEPENDENT_AMBULATORY_CARE_PROVIDER_SITE_OTHER): Payer: Medicare PPO | Admitting: Family Medicine

## 2019-09-02 ENCOUNTER — Ambulatory Visit: Payer: Medicare PPO | Admitting: Nurse Practitioner

## 2019-09-02 ENCOUNTER — Other Ambulatory Visit: Payer: Self-pay

## 2019-09-02 VITALS — BP 151/80 | HR 97 | Wt 254.0 lb

## 2019-09-02 DIAGNOSIS — E291 Testicular hypofunction: Secondary | ICD-10-CM | POA: Diagnosis not present

## 2019-09-02 MED ORDER — TESTOSTERONE CYPIONATE 200 MG/ML IM SOLN
200.0000 mg | Freq: Once | INTRAMUSCULAR | Status: AC
  Administered 2019-09-02: 200 mg via INTRAMUSCULAR

## 2019-09-02 NOTE — Progress Notes (Signed)
   Subjective:    Patient ID: Jonathan Yu, male    DOB: [birth year], 69 y.o.   MRN: [id number]  HPI Patient here in office today for testosterone injection.  No complaints of chest pain, SOB, H/A, fatigue, palpitations, mood swings or medication problems. KG LPN   Review of Systems     Objective:   Physical Exam        Assessment & Plan:  Patient tolerated injection well without any complications.  Advised to schedule next injection in 2 weeks. KG LPN

## 2019-09-02 NOTE — Progress Notes (Signed)
Agree with documentation as above.   Rennie Hack, MD  

## 2019-09-03 ENCOUNTER — Encounter: Payer: Self-pay | Admitting: Medical-Surgical

## 2019-09-03 ENCOUNTER — Ambulatory Visit (INDEPENDENT_AMBULATORY_CARE_PROVIDER_SITE_OTHER): Payer: Medicare PPO | Admitting: Medical-Surgical

## 2019-09-03 VITALS — BP 147/87 | HR 73 | Temp 98.1°F

## 2019-09-03 DIAGNOSIS — I872 Venous insufficiency (chronic) (peripheral): Secondary | ICD-10-CM

## 2019-09-03 NOTE — Assessment & Plan Note (Signed)
Keep skin well hydrated.  Elevate lower extremities as often as possible.  Resume wearing tight fitting compression stockings daily.

## 2019-09-03 NOTE — Progress Notes (Signed)
Subjective:    CC: Bilateral lower extremity rash, bloody sinus drainage  HPI: Pleasant 69 year old gentleman presenting today with complaints of rash to left lower extremity, spread to the right lower extremity.  Started as an itch approximately 10 days ago.  Rash developed after a couple of days of itching.  Increased redness with showers.  Did see some improvement for short while but then got worse again.  Has been using OTC hydrocortisone cream intermittently with minimal relief.  No change in soaps, detergents, colognes, or medications.  No new fabrics or lotions. Stopped wearing compression stockings because of symptoms.  Also reports continued bloody nasal discharge since Covid.  Does saline rinses twice a day and reports noticing blood approximately every other day.  Blood is dark red and only from the right nare at this time.  Denies nosebleeds, sinus pressure/pain. (+) Bilateral ear fullness, forceful blowing of the nose.  I reviewed the past medical history, family history, social history, surgical history, and allergies today and no changes were needed.  Please see the problem list section below in epic for further details.  Past Medical History: Past Medical History:  Diagnosis Date  . Allergy   . DDD (degenerative disc disease), lumbar   . Hyperlipidemia    "no meds since losing 106# 2 yr ago" (10/06/2013)  . Hypertension 2012   "no meds since losing 106# 2 yr ago" (10/06/2013)  . OSA on CPAP    "don't wear mask much since losing 106# 2 yr ago" (10/06/2013)  . Type II diabetes mellitus ([place])    "no meds since losing 106# 2 yr ago" (10/06/2013)  . Walking pneumonia 1968   Past Surgical History: Past Surgical History:  Procedure Laterality Date  . ANKLE FRACTURE SURGERY Left 1973  . ANKLE RECONSTRUCTION Right 05/2011  . CARPAL TUNNEL RELEASE Bilateral 2001  . HARDWARE REMOVAL Right 10/07/2013   Procedure: RIGHT ANKLE HARDWARE REMOVAL;  Surgeon: Newt Minion, MD;  Location:  [facility];  Service: Orthopedics;  Laterality: Right;  . I & D EXTREMITY Right 11/18/2013   Procedure: IRRIGATION AND DEBRIDEMENT EXTREMITY;  Surgeon: Newt Minion, MD;  Location: [facility];  Service: Orthopedics;  Laterality: Right;  Irrigation and Debridement Right Fibula , Place Antibiotic Beads and VAC  . NASAL SEPTUM SURGERY  1982  . ORIF ANKLE FRACTURE Right 09/08/2013   Procedure: REPAIR SYNDESMOSIS DISRUPTION RIGHT ANKLE;  Surgeon: Newt Minion, MD;  Location: [facility];  Service: Orthopedics;  Laterality: Right;  . SHOULDER ARTHROSCOPY W/ ROTATOR CUFF REPAIR Right 2006; 2009  . TONSILLECTOMY  1959  . TOTAL ANKLE ARTHROPLASTY Right 08/18/2013   Procedure: TOTAL ANKLE ARTHOPLASTY;  Surgeon: Newt Minion, MD;  Location: [facility];  Service: Orthopedics;  Laterality: Right;  Right Total Ankle Arthroplasty, Revision Fibular Fracture  . URETERAL STENT PLACEMENT  ~ 2010 X 2   Social History: Social History   Socioeconomic History  . Marital status: Divorced    Spouse name: Not on file  . Number of children: 5  . Years of education: Not on file  . Highest education level: Not on file  Occupational History  . Occupation: sales/ DJ  Tobacco Use  . Smoking status: Current Some Day Smoker    Years: 6.00    Types: Pipe, Cigars  . Smokeless tobacco: Never Used  . Tobacco comment: 10/06/2013 "use pipe or cigar 1-2 times/month"  Substance and Sexual Activity  . Alcohol use: Yes    Comment: 10/06/2013 "have 4-5 drinks/yr"  .  Drug use: No  . Sexual activity: Yes    Comment: 1 every 2 months  Other Topics Concern  . Not on file  Social History Narrative  . Not on file   Social Determinants of Health   Financial Resource Strain:   . Difficulty of Paying Living Expenses: Not on file  Food Insecurity:   . Worried About Charity fundraiser in the Last Year: Not on file  . Ran Out of Food in the Last Year: Not on file  Transportation Needs:   . Lack of Transportation (Medical): Not on file  . Lack of  Transportation (Non-Medical): Not on file  Physical Activity:   . Days of Exercise per Week: Not on file  . Minutes of Exercise per Session: Not on file  Stress:   . Feeling of Stress : Not on file  Social Connections:   . Frequency of Communication with Friends and Family: Not on file  . Frequency of Social Gatherings with Friends and Family: Not on file  . Attends Religious Services: Not on file  . Active Member of Clubs or Organizations: Not on file  . Attends Archivist Meetings: Not on file  . Marital Status: Not on file   Family History: Family History  Problem Relation Age of Onset  . Cancer Mother 20       Lung  . Alzheimer's disease Father   . Parkinson's disease Father   . Scoliosis Sister   . Cleft lip Sister    Allergies: Allergies  Allergen Reactions  . Sesame Oil Hives and Shortness Of Breath  . Clonazepam Hives  . Percocet [Oxycodone-Acetaminophen] Rash  . Topamax [Topiramate]     Cognitive decline  . Vicodin [Hydrocodone-Acetaminophen] Rash   Medications: See med rec.  Review of Systems: No fevers, chills, night sweats, weight loss, chest pain, or shortness of breath.   Objective:    General: Well Developed, well nourished, and in no acute distress.  Neuro: Alert and oriented x3, extra-ocular muscles intact, sensation grossly intact.  HEENT: Normocephalic, atraumatic.  No frontal or maxillary sinus tenderness.  Bilateral external ear canals and TMs within normal limits.  No lymphadenopathy. Skin: Warm and dry. Cardiac: Regular rate and rhythm, no murmurs rubs or gallops, no lower extremity edema.  Respiratory: Clear to auscultation bilaterally. Not using accessory muscles, speaking in full sentences. Musculoskeletal: Bilateral lower extremity discoloration over distal calves.  Skin dry, scaly.  +1 pitting edema to the left lower extremity.  Impression and Recommendations:    Venous stasis dermatitis of both lower extremities Keep skin well  hydrated.  Elevate lower extremities as often as possible.  Resume wearing tight fitting compression stockings daily.  Bloody nasal discharge Monitor discharge for bright red blood or nosebleeds.  Reduced force of blowing the nose.  Saline nasal spray as needed.  Return if symptoms worsen or fail to improve.  ___________________________________________ Clearnce Sorrel, DNP, APRN, FNP-BC [facility]

## 2019-09-04 DIAGNOSIS — U071 COVID-19: Secondary | ICD-10-CM | POA: Diagnosis not present

## 2019-09-09 ENCOUNTER — Ambulatory Visit: Payer: Medicare PPO

## 2019-09-14 DIAGNOSIS — G4733 Obstructive sleep apnea (adult) (pediatric): Secondary | ICD-10-CM | POA: Diagnosis not present

## 2019-09-23 ENCOUNTER — Ambulatory Visit (INDEPENDENT_AMBULATORY_CARE_PROVIDER_SITE_OTHER): Payer: Medicare PPO | Admitting: Family Medicine

## 2019-09-23 ENCOUNTER — Other Ambulatory Visit: Payer: Self-pay

## 2019-09-23 VITALS — BP 155/75 | HR 67 | Ht 66.0 in | Wt 262.0 lb

## 2019-09-23 DIAGNOSIS — E291 Testicular hypofunction: Secondary | ICD-10-CM | POA: Diagnosis not present

## 2019-09-23 MED ORDER — TESTOSTERONE CYPIONATE 200 MG/ML IM SOLN
200.0000 mg | Freq: Once | INTRAMUSCULAR | Status: AC
  Administered 2019-09-23: 200 mg via INTRAMUSCULAR

## 2019-09-23 NOTE — Progress Notes (Signed)
I have reviewed and agree with documentation.

## 2019-09-23 NOTE — Progress Notes (Signed)
Patient is here for testosterone injection. Denies chest pain, shortness of breath, headaches, and problems with medication or mood changes. His blood pressure is elevated today. On first check reading is 160/80, on second check the reading is 155/75. At home is blood pressure is running 115/72. He recently restarted blood pressure medication after stopping during Covid-19 infection. He had very low blood pressure readings the 3 weeks he was having complications with Covid infection.  Dr. Madilyn Fireman reviewed blood pressure readings and gave the okay to proceed with injection today. He will follow up in 3 weeks with Dr. Zigmund Daniel to establish primary care, since Dr. Georgina Snell has left the office, and will evaluate blood pressure readings and receive next Testosterone injection at that time.   Patient tolerated 200 mg of Testosterone injection to RUOQ well without complications. Patient advised to schedule next injection in 3 weeks.

## 2019-10-14 ENCOUNTER — Encounter: Payer: Self-pay | Admitting: Family Medicine

## 2019-10-14 ENCOUNTER — Other Ambulatory Visit: Payer: Self-pay

## 2019-10-14 ENCOUNTER — Ambulatory Visit (INDEPENDENT_AMBULATORY_CARE_PROVIDER_SITE_OTHER): Payer: Medicare PPO | Admitting: Family Medicine

## 2019-10-14 DIAGNOSIS — Z9989 Dependence on other enabling machines and devices: Secondary | ICD-10-CM

## 2019-10-14 DIAGNOSIS — G4733 Obstructive sleep apnea (adult) (pediatric): Secondary | ICD-10-CM | POA: Diagnosis not present

## 2019-10-14 DIAGNOSIS — M5416 Radiculopathy, lumbar region: Secondary | ICD-10-CM

## 2019-10-14 DIAGNOSIS — N400 Enlarged prostate without lower urinary tract symptoms: Secondary | ICD-10-CM

## 2019-10-14 DIAGNOSIS — E291 Testicular hypofunction: Secondary | ICD-10-CM

## 2019-10-14 DIAGNOSIS — I10 Essential (primary) hypertension: Secondary | ICD-10-CM

## 2019-10-14 MED ORDER — LOSARTAN POTASSIUM 50 MG PO TABS: 50.0000 mg | ORAL_TABLET | Freq: Every day | ORAL | 3 refills | Status: DC

## 2019-10-14 NOTE — Assessment & Plan Note (Signed)
Blood pressure is at goal at for age and co-morbidities.  Consistent with home readings.  I recommend he continue losartan at current strength.  In addition they were instructed to follow a low sodium diet with regular exercise to help to maintain adequate control of blood pressure.

## 2019-10-14 NOTE — Assessment & Plan Note (Signed)
Compliant with CPAP, continue.  

## 2019-10-14 NOTE — Assessment & Plan Note (Signed)
Symptoms stable.  Continue flomax.

## 2019-10-14 NOTE — Assessment & Plan Note (Signed)
Received testosterone injection today.   Will update labs at f/u in 6 months.

## 2019-10-14 NOTE — Patient Instructions (Signed)
Great to meet you today! Please continue current medications. Follow up with me in about 6 months.

## 2019-10-14 NOTE — Assessment & Plan Note (Signed)
Chronic issue.  Has some mild hamstring weakness on R with some twitching of lower extremity if fully extending leg and sitting.  He is concerned about Parkinson's but I discussed that this is most likely coming from the lower back rather than from Parkinson's based on current symptoms and findings.

## 2019-10-14 NOTE — Progress Notes (Signed)
Jonathan Yu - 69 y.o. male MRN [id number]  Date of birth: 09-28-1950  Subjective Chief Complaint  Patient presents with  . Hypogonadism    HPI Jonathan Yu is a 69 y.o. male with history of HTN, hypogonadism, dyslipidemia and BPH here today for follow up visit.  He had COVID in 07/2019 and has recovered fairly well from this.  He is still using O2 in the evenings with CPAP.    He has noticed twitching in his R leg when sitting still with leg extended at times.  Denies weakness.  Father with history of Parkinson's.  He does have tremor at time but typically if doing something, does not notice at rest.   -HTN:  Current treatment with losartan.  He had held while sick with COVID due to BP readings being low.  Readings at home recently 119/80.  He denies symptoms of hypotension. He denies symptoms related to elevated BP including headache, chest pain, shortness of breath, or vision changes.  He remains pretty active and works as a DJ on the weekends.     -Hypogonadism: receives testosterone injections every two weeks at our clinic.  He is doing well with this.  Previous testosterone level of 253 in 04/2019.  He feels good at current dose.   -BPH:  Symptoms well managed with tamsulosin.  Denies side effects.   ROS:  A comprehensive ROS was completed and negative except as noted per HPI  Allergies  Allergen Reactions  . Sesame Oil Hives and Shortness Of Breath  . Clonazepam Hives  . Percocet [Oxycodone-Acetaminophen] Rash  . Topamax [Topiramate]     Cognitive decline  . Vicodin [Hydrocodone-Acetaminophen] Rash    Past Medical History:  Diagnosis Date  . Allergy   . DDD (degenerative disc disease), lumbar   . Hyperlipidemia    "no meds since losing 106# 2 yr ago" (10/06/2013)  . Hypertension 2012   "no meds since losing 106# 2 yr ago" (10/06/2013)  . OSA on CPAP    "don't wear mask much since losing 106# 2 yr ago" (10/06/2013)  . Type II diabetes mellitus ([place])    "no meds since  losing 106# 2 yr ago" (10/06/2013)  . Walking pneumonia 1968    Past Surgical History:  Procedure Laterality Date  . ANKLE FRACTURE SURGERY Left 1973  . ANKLE RECONSTRUCTION Right 05/2011  . CARPAL TUNNEL RELEASE Bilateral 2001  . HARDWARE REMOVAL Right 10/07/2013   Procedure: RIGHT ANKLE HARDWARE REMOVAL;  Surgeon: Newt Minion, MD;  Location: [facility];  Service: Orthopedics;  Laterality: Right;  . I & D EXTREMITY Right 11/18/2013   Procedure: IRRIGATION AND DEBRIDEMENT EXTREMITY;  Surgeon: Newt Minion, MD;  Location: [facility];  Service: Orthopedics;  Laterality: Right;  Irrigation and Debridement Right Fibula , Place Antibiotic Beads and VAC  . NASAL SEPTUM SURGERY  1982  . ORIF ANKLE FRACTURE Right 09/08/2013   Procedure: REPAIR SYNDESMOSIS DISRUPTION RIGHT ANKLE;  Surgeon: Newt Minion, MD;  Location: [facility];  Service: Orthopedics;  Laterality: Right;  . SHOULDER ARTHROSCOPY W/ ROTATOR CUFF REPAIR Right 2006; 2009  . TONSILLECTOMY  1959  . TOTAL ANKLE ARTHROPLASTY Right 08/18/2013   Procedure: TOTAL ANKLE ARTHOPLASTY;  Surgeon: Newt Minion, MD;  Location: [facility];  Service: Orthopedics;  Laterality: Right;  Right Total Ankle Arthroplasty, Revision Fibular Fracture  . URETERAL STENT PLACEMENT  ~ 2010 X 2    Social History   Socioeconomic History  . Marital status: Divorced  Spouse name: Not on file  . Number of children: 5  . Years of education: Not on file  . Highest education level: Not on file  Occupational History  . Occupation: sales/ DJ  Tobacco Use  . Smoking status: Current Some Day Smoker    Years: 6.00    Types: Pipe, Cigars  . Smokeless tobacco: Never Used  . Tobacco comment: 10/06/2013 "use pipe or cigar 1-2 times/month"  Substance and Sexual Activity  . Alcohol use: Yes    Comment: 10/06/2013 "have 4-5 drinks/yr"  . Drug use: No  . Sexual activity: Yes    Comment: 1 every 2 months  Other Topics Concern  . Not on file  Social History Narrative  . Not on file    Social Determinants of Health   Financial Resource Strain:   . Difficulty of Paying Living Expenses: Not on file  Food Insecurity:   . Worried About Charity fundraiser in the Last Year: Not on file  . Ran Out of Food in the Last Year: Not on file  Transportation Needs:   . Lack of Transportation (Medical): Not on file  . Lack of Transportation (Non-Medical): Not on file  Physical Activity:   . Days of Exercise per Week: Not on file  . Minutes of Exercise per Session: Not on file  Stress:   . Feeling of Stress : Not on file  Social Connections:   . Frequency of Communication with Friends and Family: Not on file  . Frequency of Social Gatherings with Friends and Family: Not on file  . Attends Religious Services: Not on file  . Active Member of Clubs or Organizations: Not on file  . Attends Archivist Meetings: Not on file  . Marital Status: Not on file    Family History  Problem Relation Age of Onset  . Cancer Mother 54       Lung  . Alzheimer's disease Father   . Parkinson's disease Father   . Scoliosis Sister   . Cleft lip Sister     Health Maintenance  Topic Date Due  . Samul Dada  04/24/2021  . COLONOSCOPY  06/03/2029  . INFLUENZA VACCINE  Completed  . Hepatitis C Screening  Completed  . PNA vac Low Risk Adult  Completed     ----------------------------------------------------------------------------------------------------------------------------------------------------------------------------------------------------------------- Physical Exam BP 136/74   Pulse 66   Temp 98.3 F (36.8 C) (Oral)   Ht 5\' 6"  (1.676 m)   Wt 262 lb (118.8 kg)   BMI 42.29 kg/m   Physical Exam Constitutional:      Appearance: Normal appearance.  HENT:     Head: Normocephalic and atraumatic.     Mouth/Throat:     Mouth: Mucous membranes are moist.  Eyes:     General: No scleral icterus. Cardiovascular:     Rate and Rhythm: Normal rate and regular rhythm.   Abdominal:     General: Abdomen is flat.  Skin:    General: Skin is warm and dry.  Neurological:     General: No focal deficit present.     Mental Status: He is alert and oriented to person, place, and time.     Motor: No weakness.     Coordination: Coordination normal.     Gait: Gait normal.     Comments: No resting tremor noted.   Mild twitching of R lower leg if fully extended.  Mild weakness with hamstring testing on R  Psychiatric:  Mood and Affect: Mood normal.        Behavior: Behavior normal.     ------------------------------------------------------------------------------------------------------------------------------------------------------------------------------------------------------------------- Assessment and Plan  Essential hypertension, benign Blood pressure is at goal at for age and co-morbidities.  Consistent with home readings.  I recommend he continue losartan at current strength.  In addition they were instructed to follow a low sodium diet with regular exercise to help to maintain adequate control of blood pressure.     OSA on CPAP Compliant with CPAP, continue  Hypogonadism in male Received testosterone injection today.   Will update labs at f/u in 6 months.   Right lumbar radiculopathy Chronic issue.  Has some mild hamstring weakness on R with some twitching of lower extremity if fully extending leg and sitting.  He is concerned about Parkinson's but I discussed that this is most likely coming from the lower back rather than from Parkinson's based on current symptoms and findings.   BPH (benign prostatic hyperplasia) Symptoms stable.  Continue flomax.    Meds ordered this encounter  Medications  . losartan (COZAAR) 50 MG tablet    Sig: Take 1 tablet (50 mg total) by mouth daily.    Dispense:  90 tablet    Refill:  3    Return in about 6 months (around 04/15/2020) for HTN/Update labs.    This visit occurred during the SARS-CoV-2  public health emergency.  Safety protocols were in place, including screening questions prior to the visit, additional usage of staff PPE, and extensive cleaning of exam room while observing appropriate contact time as indicated for disinfecting solutions.

## 2019-10-15 ENCOUNTER — Ambulatory Visit: Payer: Medicare PPO | Attending: Internal Medicine

## 2019-10-15 DIAGNOSIS — Z23 Encounter for immunization: Secondary | ICD-10-CM | POA: Insufficient documentation

## 2019-10-15 NOTE — Progress Notes (Signed)
   Covid-19 Vaccination Clinic  Name:  Jonathan Yu    MRN: [id number] DOB: February 01, 1951  10/15/2019  [name] was observed post Covid-19 immunization for 30 minutes based on pre-vaccination screening without incident. He was provided with Vaccine Information Sheet and instruction to access the V-Safe system.   [name] was instructed to call 911 with any severe reactions post vaccine: Marland Kitchen Difficulty breathing  . Swelling of face and throat  . A fast heartbeat  . A bad rash all over body  . Dizziness and weakness

## 2019-11-04 ENCOUNTER — Ambulatory Visit (INDEPENDENT_AMBULATORY_CARE_PROVIDER_SITE_OTHER): Payer: Medicare PPO | Admitting: Family Medicine

## 2019-11-04 ENCOUNTER — Other Ambulatory Visit: Payer: Self-pay

## 2019-11-04 VITALS — BP 157/87 | HR 66 | Ht 66.0 in | Wt 262.0 lb

## 2019-11-04 DIAGNOSIS — E291 Testicular hypofunction: Secondary | ICD-10-CM

## 2019-11-04 MED ORDER — TESTOSTERONE CYPIONATE 200 MG/ML IM SOLN
200.0000 mg | Freq: Once | INTRAMUSCULAR | Status: AC
  Administered 2019-11-04: 200 mg via INTRAMUSCULAR

## 2019-11-04 NOTE — Progress Notes (Signed)
Patient is here for testosterone injection. Denies chest pain, shortness of breath, headaches, and problems with medication or mood changes. His blood pressure is elevated today. On first check reading is 157/87, on second check the reading is 144/67.  At home he states blood pressure is always in normal range. Proceeded with testosterone injection.  Patient tolerated 200 mg of Testosterone injection to RUOQ well without complications. Patient advised to schedule next injection in 3 weeks.

## 2019-11-10 ENCOUNTER — Ambulatory Visit: Payer: Medicare PPO | Attending: Internal Medicine

## 2019-11-10 DIAGNOSIS — Z23 Encounter for immunization: Secondary | ICD-10-CM

## 2019-11-10 NOTE — Progress Notes (Signed)
   Covid-19 Vaccination Clinic  Name:  Jonathan Yu    MRN: [id number] DOB: [birth year]  11/10/2019  [name] was observed post Covid-19 immunization for 15 minutes without incident. He was provided with Vaccine Information Sheet and instruction to access the V-Safe system.   [name] was instructed to call 911 with any severe reactions post vaccine: Marland Kitchen Difficulty breathing  . Swelling of face and throat  . A fast heartbeat  . A bad rash all over body  . Dizziness and weakness   Immunizations Administered    Name Date Dose VIS Date Route   Pfizer COVID-19 Vaccine 11/10/2019  1:55 PM 0.3 mL 07/23/2019 Intramuscular   Manufacturer: [place]   Lot: [id number]   [place]: [id number]

## 2019-11-25 ENCOUNTER — Ambulatory Visit (INDEPENDENT_AMBULATORY_CARE_PROVIDER_SITE_OTHER): Payer: Medicare PPO | Admitting: Family Medicine

## 2019-11-25 VITALS — BP 140/84 | HR 68

## 2019-11-25 DIAGNOSIS — E291 Testicular hypofunction: Secondary | ICD-10-CM

## 2019-11-25 MED ORDER — TESTOSTERONE CYPIONATE 200 MG/ML IM SOLN
200.0000 mg | Freq: Once | INTRAMUSCULAR | Status: AC
  Administered 2019-11-25: 200 mg via INTRAMUSCULAR

## 2019-11-25 NOTE — Progress Notes (Signed)
Medical screening examination/treatment was performed by qualified clinical staff member and as supervising physician I was immediately available for consultation/collaboration. I have reviewed documentation and agree with assessment and plan.  Monica Codd, DO  

## 2019-11-25 NOTE — Progress Notes (Signed)
Patient is here for testosterone injection. Denies chest pain, shortness of breath, headaches, and problems with medication or mood changes.   He does state he has ongoing issues with weakness and fatigue since Covid infection. After working in his yard he is exhausted when he used to be able to do this without trouble. If he has ongoing issues he will make an appointment for evaluation with Dr. Zigmund Daniel.   Blood pressure was elevated somewhat in the office today: 147/71 HR 68. When rechecked with manual cuff the reading was 140/84. At home last night his reading was 127/76 HR 59. We proceeded with testosterone injection.   Patient tolerated testosterone 200 mg injection to LUOQ well without complications. Patient advised to schedule next injection in 21 days.

## 2019-12-16 ENCOUNTER — Ambulatory Visit: Payer: Medicare PPO

## 2019-12-16 ENCOUNTER — Other Ambulatory Visit: Payer: Self-pay

## 2019-12-16 ENCOUNTER — Ambulatory Visit (INDEPENDENT_AMBULATORY_CARE_PROVIDER_SITE_OTHER): Payer: Medicare PPO | Admitting: Family Medicine

## 2019-12-16 VITALS — BP 137/70 | HR 87 | Ht 66.0 in | Wt 272.0 lb

## 2019-12-16 DIAGNOSIS — E291 Testicular hypofunction: Secondary | ICD-10-CM

## 2019-12-16 MED ORDER — TESTOSTERONE CYPIONATE 200 MG/ML IM SOLN
200.0000 mg | Freq: Once | INTRAMUSCULAR | Status: AC
  Administered 2019-12-16: 200 mg via INTRAMUSCULAR

## 2019-12-16 NOTE — Progress Notes (Signed)
Pt in today for testosterone injection.  No headaches, chest pain, or any mood changes.    He still complains of worsening fatigue since having Covid.  He stated that he's using the O2 at night with his CPAP.  He feels like he's taking "more shallow breaths" and wakes up tired.  I told him that I would advise you of this and would let him know if you wanted to investigate this further.

## 2019-12-16 NOTE — Progress Notes (Signed)
Medical screening examination/treatment was performed by qualified clinical staff member and as supervising physician I was immediately available for consultation/collaboration. I have reviewed documentation and agree with assessment and plan.  Alik Mawson, DO  

## 2020-01-06 ENCOUNTER — Ambulatory Visit (INDEPENDENT_AMBULATORY_CARE_PROVIDER_SITE_OTHER): Payer: Medicare PPO | Admitting: Family Medicine

## 2020-01-06 VITALS — BP 149/78 | HR 72 | Ht 66.0 in | Wt 266.0 lb

## 2020-01-06 DIAGNOSIS — R251 Tremor, unspecified: Secondary | ICD-10-CM

## 2020-01-06 DIAGNOSIS — E291 Testicular hypofunction: Secondary | ICD-10-CM

## 2020-01-06 DIAGNOSIS — E538 Deficiency of other specified B group vitamins: Secondary | ICD-10-CM

## 2020-01-06 DIAGNOSIS — R5383 Other fatigue: Secondary | ICD-10-CM

## 2020-01-06 MED ORDER — TESTOSTERONE CYPIONATE 200 MG/ML IM SOLN
200.0000 mg | Freq: Once | INTRAMUSCULAR | Status: AC
  Administered 2020-01-06: 200 mg via INTRAMUSCULAR

## 2020-01-06 NOTE — Progress Notes (Signed)
Medical screening examination/treatment was performed by qualified clinical staff member and as supervising physician I was immediately available for consultation/collaboration. I have reviewed documentation and agree with assessment and plan.  B12 ordered  Luetta Nutting, DO

## 2020-01-06 NOTE — Progress Notes (Signed)
   Subjective:    Patient ID: Jonathan Yu, male    DOB: 09-12-1950, 69 y.o.   MRN: [id number]  HPI Patient is here for a testosterone injection. Denies chest pain, shortness of breath, headaches, problems with mood change, or medication problems.  Patient reports starting new medication, Docusate, daily.    Review of Systems     Objective:   Physical Exam        Assessment & Plan:  Patient tolerated injection in LUOQ well without complications. Patient advised to schedule his next injection for 3 weeks from today.  Initial blood pressure reading today was 157/81 but after 10 minutes of rest came down to 149/78. Advised patient ideal goal is no higher than 130/80. Patient reported home blood pressure readings as 127/76, 118/68, and 119/69. Advised him to continue to monitor this at home. He has brought in BP machine to be calibrated with our machines to verify accurcay.    Patient had C/O low energy and some shaking. Patient requesting his B12 levels be checked. He reports they have been low in the past but I do not see where we have checked these. Upcoming appt with PCP in September, but wanting to know if OK to go ahead and order B12 to check. I have pended this lab, please advise if OK to go ahead and order.   HM: Due for colonoscopy, patient states he has this scheduled for June (it had been postponed due to [place])

## 2020-01-12 DIAGNOSIS — G4733 Obstructive sleep apnea (adult) (pediatric): Secondary | ICD-10-CM | POA: Diagnosis not present

## 2020-01-27 ENCOUNTER — Ambulatory Visit (INDEPENDENT_AMBULATORY_CARE_PROVIDER_SITE_OTHER): Payer: Medicare PPO | Admitting: Medical-Surgical

## 2020-01-27 ENCOUNTER — Other Ambulatory Visit: Payer: Self-pay

## 2020-01-27 ENCOUNTER — Telehealth: Payer: Self-pay | Admitting: Family Medicine

## 2020-01-27 VITALS — BP 131/67 | HR 73 | Ht 66.0 in

## 2020-01-27 DIAGNOSIS — E291 Testicular hypofunction: Secondary | ICD-10-CM

## 2020-01-27 MED ORDER — TESTOSTERONE CYPIONATE 200 MG/ML IM SOLN
200.0000 mg | Freq: Once | INTRAMUSCULAR | Status: AC
  Administered 2020-01-27: 200 mg via INTRAMUSCULAR

## 2020-01-27 NOTE — Telephone Encounter (Signed)
From what I can tell, the code used for the B12 lab was for shaking as well as fatigue.  Usually this is covered by insurance when we are trying to evaluate for significant fatigue.  Unfortunately, because there was no suspicion of Covid or Covid-like illness, changing it to associated with a Covid swab would not be appropriate.  We may need to wait for Dr. Zigmund Daniel to come back on Monday input as I am not familiar with all of the symptoms and the situation.

## 2020-01-27 NOTE — Progress Notes (Signed)
Patient is here for testosterone injection. Denies chest pain, shortness of breath, headaches, and problems with medication or mood changes.   Patient tolerated testosterone 200 mg injection to RUOQ well without complications. Patient advised to schedule next injection in 3 weeks.  He still complains of low energy and weakness. He wasn't instructed about b12 lab being ordered last visit. He was given requisition form and advised to have drawn downstairs. We will follow up after we receive results. He has appt scheduled with Dr. Zigmund Daniel in September.

## 2020-01-27 NOTE — Telephone Encounter (Signed)
Patient calling in stating that the labs ordered for the b12 were not covered by insurance. States that if it was coded for covid swab it may be different. Patient had concerns about this.

## 2020-01-28 NOTE — Telephone Encounter (Signed)
Pt aware of Joy's response. He said that his concern is that he had COVID in December and that all of his current symptoms are the same as what he had with COVID, that they all did not resolve.   Pt states that when he had COVID that he was prescribed home O2. He states he no longer uses and it and contacted the company to have them come pick it up. He was told that the best way to do this would be to get a note from his doctor stating he would discontinue the O2 and they can come pick up the tank.

## 2020-02-02 DIAGNOSIS — U071 COVID-19: Secondary | ICD-10-CM | POA: Diagnosis not present

## 2020-02-02 NOTE — Telephone Encounter (Signed)
Can we contact DME company for O2 and provide verbal order to discontinue home O2?  As far as the B12 levels- I have updated diagnosis to fatigue and B12 deficiency since he has a history of this being low.    Thanks!  CM

## 2020-02-02 NOTE — Addendum Note (Signed)
Addended by: Perlie Mayo on: 02/02/2020 04:56 PM   Modules accepted: Orders

## 2020-02-07 NOTE — Telephone Encounter (Signed)
Task completed. Per pt, the O2 tank was collected last week. No other inquiries during the call.

## 2020-02-10 ENCOUNTER — Ambulatory Visit (INDEPENDENT_AMBULATORY_CARE_PROVIDER_SITE_OTHER): Payer: Medicare PPO | Admitting: Family Medicine

## 2020-02-10 VITALS — BP 140/70 | HR 72

## 2020-02-10 DIAGNOSIS — E291 Testicular hypofunction: Secondary | ICD-10-CM

## 2020-02-10 MED ORDER — TESTOSTERONE CYPIONATE 200 MG/ML IM SOLN
200.0000 mg | Freq: Once | INTRAMUSCULAR | Status: AC
  Administered 2020-02-10: 200 mg via INTRAMUSCULAR

## 2020-02-10 NOTE — Progress Notes (Signed)
Medical screening examination/treatment was performed by qualified clinical staff member and as supervising physician I was immediately available for consultation/collaboration. I have reviewed documentation and agree with assessment and plan.  Ladislao Cohenour, DO  

## 2020-02-10 NOTE — Progress Notes (Signed)
Patient is here for testosterone injection. Denies chest pain, shortness of breath, headaches, and problems with medication or mood changes.   Patient tolerated testosterone 200 mg injection to LUOQ well without complications. Patient advised to schedule next injection in 3 weeks.

## 2020-02-11 DIAGNOSIS — G4733 Obstructive sleep apnea (adult) (pediatric): Secondary | ICD-10-CM | POA: Diagnosis not present

## 2020-03-02 ENCOUNTER — Ambulatory Visit (INDEPENDENT_AMBULATORY_CARE_PROVIDER_SITE_OTHER): Payer: Medicare PPO | Admitting: Family Medicine

## 2020-03-02 VITALS — BP 150/74 | HR 63

## 2020-03-02 DIAGNOSIS — E291 Testicular hypofunction: Secondary | ICD-10-CM

## 2020-03-02 MED ORDER — TESTOSTERONE CYPIONATE 200 MG/ML IM SOLN
200.0000 mg | Freq: Once | INTRAMUSCULAR | Status: AC
  Administered 2020-03-02: 200 mg via INTRAMUSCULAR

## 2020-03-02 NOTE — Progress Notes (Signed)
Medical screening examination/treatment was performed by qualified clinical staff member and as supervising physician I was immediately available for consultation/collaboration. I have reviewed documentation and agree with assessment and plan.  Brandom Kerwin, DO  

## 2020-03-02 NOTE — Progress Notes (Signed)
Patient is here for testosterone injection. Denies chest pain, shortness of breath, headaches, and problems with medication or mood changes.   His first blood pressure reading today is: 163/79. After sitting, his second reading is: 150/74.   I spoke with Dr. Zigmund Daniel who advised okay to proceed with injection, but patient will need a follow up with him in the next month.   Patient tolerated testosterone 200 mg injection to RUOQ well without complications. Patient advised to schedule next injection in 21 days.   He would like to have routine blood work along with B12 that was ordered. It looks like labs last drawn in September 2020. It hasn't quite been a year. Let patient know that he can discuss with Dr. Zigmund Daniel at his visit what additional lab work is needed.

## 2020-03-03 IMAGING — MR MR SHOULDER*L* W/O CM
5 series · 40 of 40 positions shown · non-contrast
Comparison: Plain films left shoulder from [REDACTED]
10/02/2017.

CLINICAL DATA: Left shoulder pain and limited range of motion since
the patient fell through a ceiling in September 2017. Initial
encounter.

EXAM:
MRI OF THE LEFT SHOULDER WITHOUT CONTRAST
TECHNIQUE: Multiplanar, multisequence MR imaging of the shoulder was performed.
No intravenous contrast was administered.

[Series 3: T2 fat-sat · axial · 4.0mm · 0.59mm/px · z∈[-38,+58]mm · 8 of 23 slices shown (1 of 3)]
[im 1/23]
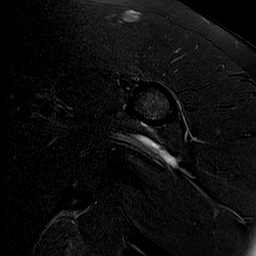
[im 4/23]
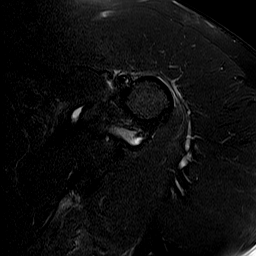
[im 7/23]
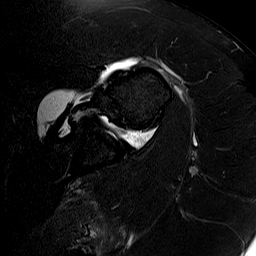
[im 10/23]
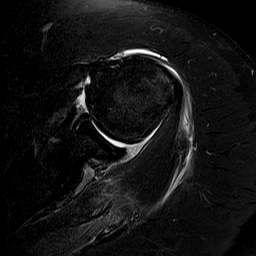
[im 13/23]
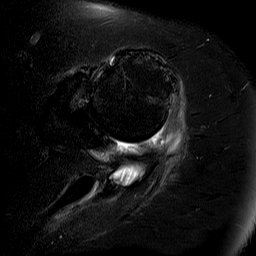
[im 16/23]
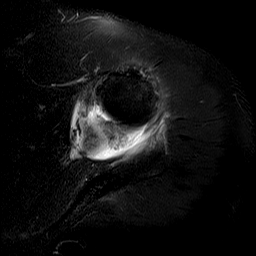
[im 19/23]
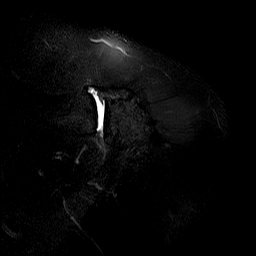
[im 23/23]
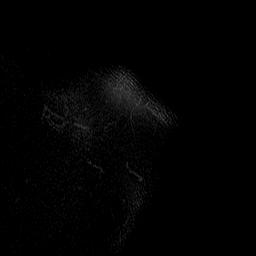

[Series 4: T2 fat-sat · oblique · 4.0mm · 0.59mm/px · 8 of 22 slices shown (2 of 3)]
[im 1/22]
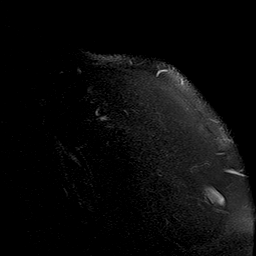
[im 4/22]
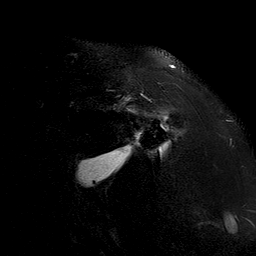
[im 7/22]
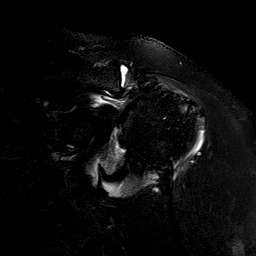
[im 10/22]
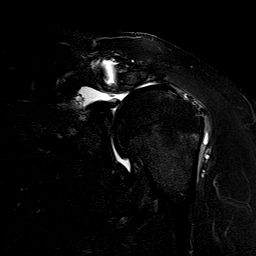
[im 13/22]
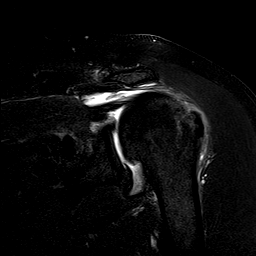
[im 16/22]
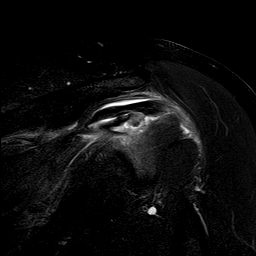
[im 19/22]
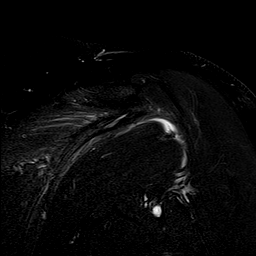
[im 22/22]
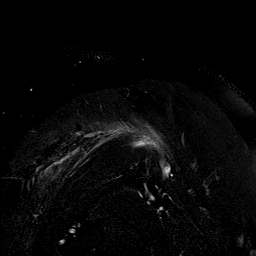

[Series 5: PD · oblique · 4.0mm · 0.59mm/px · 8 of 22 slices shown]
[im 1/22]
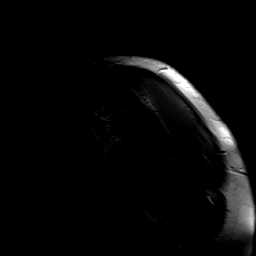
[im 4/22]
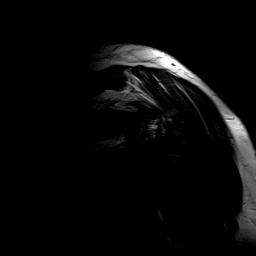
[im 7/22]
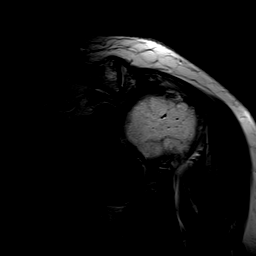
[im 10/22]
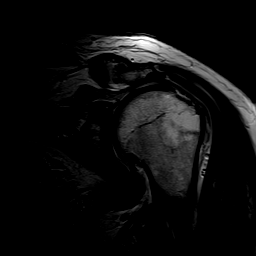
[im 13/22]
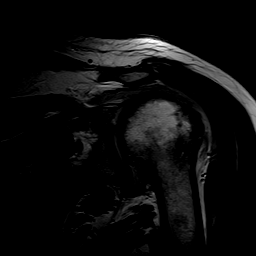
[im 16/22]
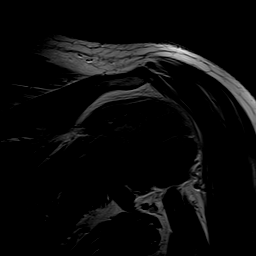
[im 19/22]
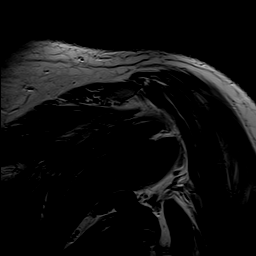
[im 22/22]
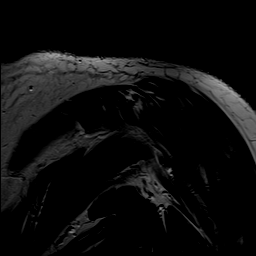

[Series 6: T1 · oblique · 4.0mm · 0.59mm/px · 8 of 23 slices shown]
[im 1/23]
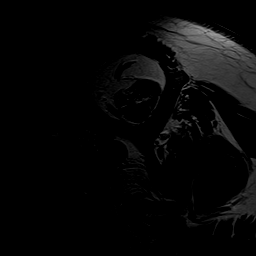
[im 4/23]
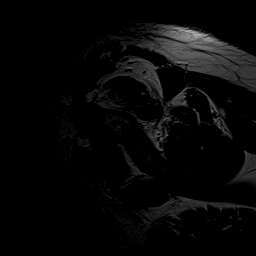
[im 7/23]
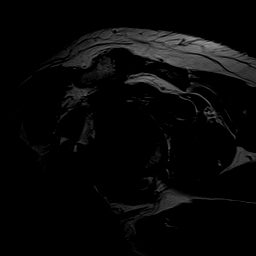
[im 10/23]
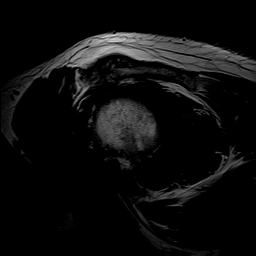
[im 13/23]
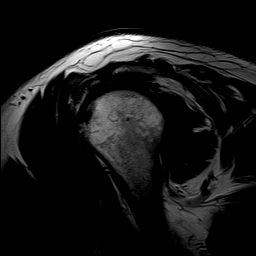
[im 16/23]
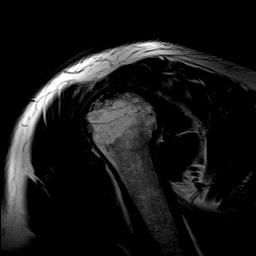
[im 19/23]
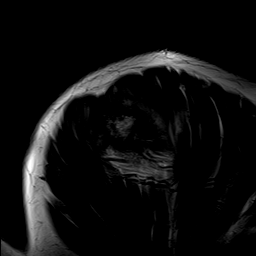
[im 23/23]
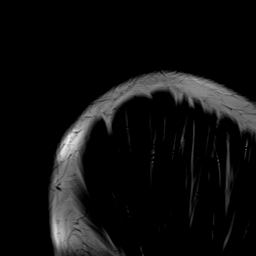

[Series 7: T2 fat-sat · oblique · 4.0mm · 0.59mm/px · 8 of 23 slices shown (3 of 3)]
[im 1/23]
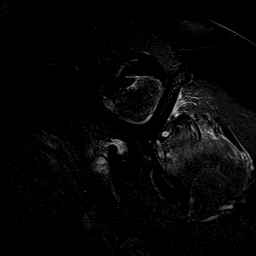
[im 4/23]
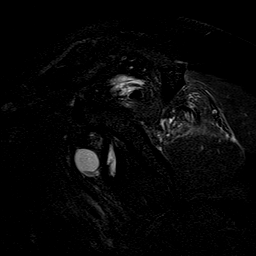
[im 7/23]
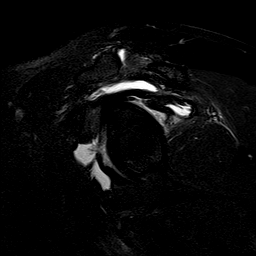
[im 10/23]
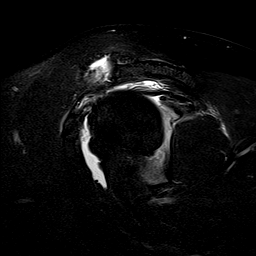
[im 13/23]
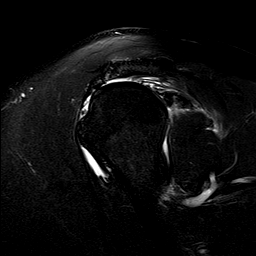
[im 16/23]
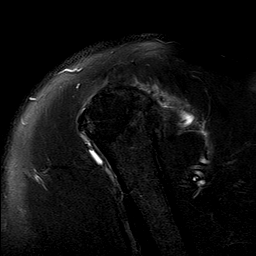
[im 19/23]
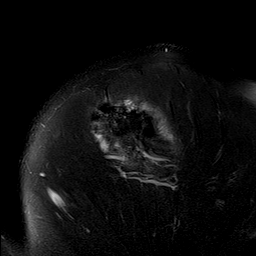
[im 23/23]
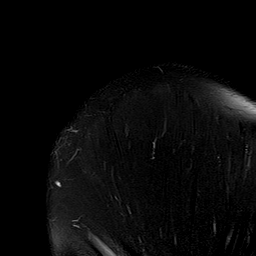

[40 of 40 positions shown; findings below may reference images not displayed]

FINDINGS: Rotator cuff: The supraspinatus, infraspinatus and subscapularis
tendons are all completely torn. Retraction of each tendon is to the
level of the glenoid, 4-5 cm for the supraspinatus and infraspinatus
and 2-3 cm for the subscapularis.

Muscles: Fatty atrophy of the subscapularis is moderately severe.
Mild fatty atrophy of the remainder of the shoulder musculature is
identified.

Biceps long head: The tendon is completely torn from the superior
labrum and retracted 4-5 cm below the top of the humeral head.

Acromioclavicular Joint: Bulky degenerative change is seen. Type 2
acromion. Fluid is present in the subacromial/subdeltoid bursa.

Glenohumeral Joint: Moderate degenerative changes seen with
cartilage loss and osteophyte off the humeral head.

Labrum:  Degenerated without focal tear.

Bones:  No fracture or worrisome lesion.

Other: None.
IMPRESSION: Complete tears of the supraspinatus, infraspinatus and
subscapularis. The supraspinatus and infraspinatus are retracted to
the glenoid, 4-5 cm, with mild atrophy identified. The subscapularis
is also retracted to the glenoid, 2-3 cm with moderately severe
atrophy of the muscle belly.

Complete tear of the long head of biceps from the superior labrum.

Bulky acromioclavicular osteoarthritis. Moderate glenohumeral
osteoarthritis is also seen.

## 2020-03-13 DIAGNOSIS — G4733 Obstructive sleep apnea (adult) (pediatric): Secondary | ICD-10-CM | POA: Diagnosis not present

## 2020-03-16 ENCOUNTER — Encounter: Payer: Self-pay | Admitting: Family Medicine

## 2020-03-16 ENCOUNTER — Ambulatory Visit: Payer: Medicare PPO | Admitting: Family Medicine

## 2020-03-16 ENCOUNTER — Other Ambulatory Visit: Payer: Self-pay

## 2020-03-16 VITALS — BP 124/85 | HR 77 | Ht 66.14 in | Wt 266.1 lb

## 2020-03-16 DIAGNOSIS — I10 Essential (primary) hypertension: Secondary | ICD-10-CM

## 2020-03-16 DIAGNOSIS — N529 Male erectile dysfunction, unspecified: Secondary | ICD-10-CM | POA: Diagnosis not present

## 2020-03-16 DIAGNOSIS — R7303 Prediabetes: Secondary | ICD-10-CM

## 2020-03-16 DIAGNOSIS — N400 Enlarged prostate without lower urinary tract symptoms: Secondary | ICD-10-CM

## 2020-03-16 DIAGNOSIS — R209 Unspecified disturbances of skin sensation: Secondary | ICD-10-CM

## 2020-03-16 DIAGNOSIS — L821 Other seborrheic keratosis: Secondary | ICD-10-CM

## 2020-03-16 DIAGNOSIS — E785 Hyperlipidemia, unspecified: Secondary | ICD-10-CM | POA: Diagnosis not present

## 2020-03-16 DIAGNOSIS — E291 Testicular hypofunction: Secondary | ICD-10-CM | POA: Diagnosis not present

## 2020-03-16 MED ORDER — LOSARTAN POTASSIUM 50 MG PO TABS: 50.0000 mg | ORAL_TABLET | Freq: Every day | ORAL | 3 refills | Status: DC

## 2020-03-16 MED ORDER — TAMSULOSIN HCL 0.4 MG PO CAPS: 0.4000 mg | ORAL_CAPSULE | Freq: Every day | ORAL | 3 refills | Status: DC

## 2020-03-16 MED ORDER — TADALAFIL 20 MG PO TABS: ORAL_TABLET | ORAL | 12 refills | Status: DC

## 2020-03-16 NOTE — Assessment & Plan Note (Signed)
Having some increased shaky feeling, felt like this previously with elevate blood sugars.  Update a1c.

## 2020-03-16 NOTE — Progress Notes (Signed)
Jonathan Yu - 69 y.o. male MRN [id number]  Date of birth: 1951/03/18  Subjective Chief Complaint  Patient presents with  . Follow-up    HPI Jonathan Yu is a 69 y.o. male here today for follow up visit.  He has a history of HTN, OSA, hypogonadism, BPH, HLD and anxiety.  Overall he reports he is doing well.  Has a few concerns including some skin issues and shaky feeling at times.  Reports having shaky feeling previously when his blood sugars were higher.  He does have some intermittent numb/tingling feeling as well.   Skin issues include a couple of itchy places on back.  Present for a several months.  Denies pain or change in size or color.    HTN has been well managed with losartan.  He is tolerating this well.  He denies side effects related to medications.  He has not had shortness of breath, palpitations, headache or vision changes.  He continues to received tesosterone injection q3 weeks here in the clinic.  Doing well with this.  Denies increase BPH symptoms.  Continues on flomax as well.   ROS:  A comprehensive ROS was completed and negative except as noted per HPI   Allergies  Allergen Reactions  . Sesame Oil Hives and Shortness Of Breath  . Clonazepam Hives  . Percocet [Oxycodone-Acetaminophen] Rash  . Topamax [Topiramate]     Cognitive decline  . Vicodin [Hydrocodone-Acetaminophen] Rash    Past Medical History:  Diagnosis Date  . Allergy   . DDD (degenerative disc disease), lumbar   . Hyperlipidemia    "no meds since losing 106# 2 yr ago" (10/06/2013)  . Hypertension 2012   "no meds since losing 106# 2 yr ago" (10/06/2013)  . OSA on CPAP    "don't wear mask much since losing 106# 2 yr ago" (10/06/2013)  . Type II diabetes mellitus ([place])    "no meds since losing 106# 2 yr ago" (10/06/2013)  . Walking pneumonia 1968    Past Surgical History:  Procedure Laterality Date  . ANKLE FRACTURE SURGERY Left 1973  . ANKLE RECONSTRUCTION Right 05/2011  . CARPAL TUNNEL  RELEASE Bilateral 2001  . HARDWARE REMOVAL Right 10/07/2013   Procedure: RIGHT ANKLE HARDWARE REMOVAL;  Surgeon: Newt Minion, MD;  Location: [place];  Service: Orthopedics;  Laterality: Right;  . I & D EXTREMITY Right 11/18/2013   Procedure: IRRIGATION AND DEBRIDEMENT EXTREMITY;  Surgeon: Newt Minion, MD;  Location: [place];  Service: Orthopedics;  Laterality: Right;  Irrigation and Debridement Right Fibula , Place Antibiotic Beads and VAC  . NASAL SEPTUM SURGERY  1982  . ORIF ANKLE FRACTURE Right 09/08/2013   Procedure: REPAIR SYNDESMOSIS DISRUPTION RIGHT ANKLE;  Surgeon: Newt Minion, MD;  Location: [place];  Service: Orthopedics;  Laterality: Right;  . SHOULDER ARTHROSCOPY W/ ROTATOR CUFF REPAIR Right 2006; 2009  . TONSILLECTOMY  1959  . TOTAL ANKLE ARTHROPLASTY Right 08/18/2013   Procedure: TOTAL ANKLE ARTHOPLASTY;  Surgeon: Newt Minion, MD;  Location: [place];  Service: Orthopedics;  Laterality: Right;  Right Total Ankle Arthroplasty, Revision Fibular Fracture  . URETERAL STENT PLACEMENT  ~ 2010 X 2    Social History   Socioeconomic History  . Marital status: Divorced    Spouse name: Not on file  . Number of children: 5  . Years of education: Not on file  . Highest education level: Not on file  Occupational History  . Occupation: sales/ DJ  Tobacco Use  .  Smoking status: Current Some Day Smoker    Years: 6.00    Types: Pipe, Cigars  . Smokeless tobacco: Never Used  . Tobacco comment: 10/06/2013 "use pipe or cigar 1-2 times/month"  Substance and Sexual Activity  . Alcohol use: Yes    Comment: 10/06/2013 "have 4-5 drinks/yr"  . Drug use: No  . Sexual activity: Yes    Comment: 1 every 2 months  Other Topics Concern  . Not on file  Social History Narrative  . Not on file   Social Determinants of Health   Financial Resource Strain:   . Difficulty of Paying Living Expenses:   Food Insecurity:   . Worried About Charity fundraiser in the Last Year:   . Arboriculturist in the Last  Year:   Transportation Needs:   . Film/video editor (Medical):   Marland Kitchen Lack of Transportation (Non-Medical):   Physical Activity:   . Days of Exercise per Week:   . Minutes of Exercise per Session:   Stress:   . Feeling of Stress :   Social Connections:   . Frequency of Communication with Friends and Family:   . Frequency of Social Gatherings with Friends and Family:   . Attends Religious Services:   . Active Member of Clubs or Organizations:   . Attends Archivist Meetings:   Marland Kitchen Marital Status:     Family History  Problem Relation Age of Onset  . Cancer Mother 30       Lung  . Alzheimer's disease Father   . Parkinson's disease Father   . Scoliosis Sister   . Cleft lip Sister     Health Maintenance  Topic Date Due  . INFLUENZA VACCINE  03/12/2020  . TETANUS/TDAP  04/24/2021  . COLONOSCOPY  06/03/2029  . COVID-19 Vaccine  Completed  . Hepatitis C Screening  Completed  . PNA vac Low Risk Adult  Completed     ----------------------------------------------------------------------------------------------------------------------------------------------------------------------------------------------------------------- Physical Exam BP 124/85 (BP Location: Left Arm, Patient Position: Sitting, Cuff Size: Large)   Pulse 77   Ht 5' 6.14" (1.68 m)   Wt 266 lb 1.9 oz (120.7 kg)   SpO2 93%   BMI 42.77 kg/m   Physical Exam Constitutional:      Appearance: Normal appearance.  HENT:     Head: Normocephalic and atraumatic.  Cardiovascular:     Rate and Rhythm: Normal rate and regular rhythm.  Pulmonary:     Effort: Pulmonary effort is normal.     Breath sounds: Normal breath sounds.  Skin:    General: Skin is warm and dry.     Comments: He has a few scattered, waxy/scaly, slightly raised patches on back and chest.  Consistent with SK.    Neurological:     General: No focal deficit present.     Mental Status: He is alert.  Psychiatric:        Mood and Affect:  Mood normal.        Behavior: Behavior normal.     ------------------------------------------------------------------------------------------------------------------------------------------------------------------------------------------------------------------- Assessment and Plan  Essential hypertension, benign Blood pressure is at goal at for age and co-morbidities.  I recommend continuation of losartan.  In addition they were instructed to follow a low sodium diet with regular exercise to help to maintain adequate control of blood pressure.    Hypogonadism in male Doing well with current testosterone supplementation.  Update levels along with PSA and CBC  BPH (benign prostatic hyperplasia) Stable symptoms with flomax.  Update PSA  ED (erectile dysfunction) Cialis continues to work well for him.  Medication renewed.   Prediabetes Having some increased shaky feeling, felt like this previously with elevate blood sugars.  Update a1c.   Seborrheic keratoses Areas of concern consistent with SK, reassured.    Meds ordered this encounter  Medications  . tadalafil (CIALIS) 20 MG tablet    Sig: TAKE HALF TO ONE TABLET BY MOUTH EVERY OTHER DAY AS NEEDED FOR ERECTILE DYSFUNCTION    Dispense:  30 tablet    Refill:  12  . tamsulosin (FLOMAX) 0.4 MG CAPS capsule    Sig: Take 1 capsule (0.4 mg total) by mouth daily after breakfast.    Dispense:  90 capsule    Refill:  3  . losartan (COZAAR) 50 MG tablet    Sig: Take 1 tablet (50 mg total) by mouth daily.    Dispense:  90 tablet    Refill:  3    Return in about 6 months (around 09/16/2020) for HTN/Low testosterone.    This visit occurred during the SARS-CoV-2 public health emergency.  Safety protocols were in place, including screening questions prior to the visit, additional usage of staff PPE, and extensive cleaning of exam room while observing appropriate contact time as indicated for disinfecting solutions.

## 2020-03-16 NOTE — Patient Instructions (Addendum)
Great to see you! BP looks great We'll be in touch with lab results and recommendations.  Follow up with me in about 6 months.

## 2020-03-16 NOTE — Assessment & Plan Note (Signed)
Areas of concern consistent with SK, reassured.

## 2020-03-16 NOTE — Assessment & Plan Note (Signed)
Stable symptoms with flomax.  Update PSA

## 2020-03-16 NOTE — Assessment & Plan Note (Signed)
Cialis continues to work well for him.  Medication renewed.

## 2020-03-16 NOTE — Assessment & Plan Note (Signed)
Doing well with current testosterone supplementation.  Update levels along with PSA and CBC

## 2020-03-16 NOTE — Assessment & Plan Note (Signed)
Blood pressure is at goal at for age and co-morbidities.  I recommend continuation of losartan.  In addition they were instructed to follow a low sodium diet with regular exercise to help to maintain adequate control of blood pressure.   

## 2020-03-17 LAB — COMPLETE METABOLIC PANEL WITH GFR
AG Ratio: 1.5 (calc) (ref 1.0–2.5)
ALT: 30 U/L (ref 9–46)
AST: 21 U/L (ref 10–35)
Albumin: 4.1 g/dL (ref 3.6–5.1)
Alkaline phosphatase (APISO): 43 U/L (ref 35–144)
BUN: 12 mg/dL (ref 7–25)
CO2: 29 mmol/L (ref 20–32)
Calcium: 9.3 mg/dL (ref 8.6–10.3)
Chloride: 101 mmol/L (ref 98–110)
Creat: 1.03 mg/dL (ref 0.70–1.25)
GFR, Est African American: 87 mL/min/{1.73_m2} (ref >=60)
GFR, Est Non African American: 75 mL/min/{1.73_m2} (ref >=60)
Globulin: 2.8 g/dL (calc) (ref 1.9–3.7)
Glucose, Bld: 122 mg/dL — ABNORMAL HIGH (ref 65–99)
Potassium: 4.2 mmol/L (ref 3.5–5.3)
Sodium: 137 mmol/L (ref 135–146)
Total Bilirubin: 2.2 mg/dL — ABNORMAL HIGH (ref 0.2–1.2)
Total Protein: 6.9 g/dL (ref 6.1–8.1)

## 2020-03-17 LAB — LIPID PANEL
Cholesterol: 197 mg/dL (ref <200)
HDL: 35 mg/dL — ABNORMAL LOW (ref >=40)
LDL Cholesterol (Calc): 126 mg/dL (calc) — ABNORMAL HIGH
Non-HDL Cholesterol (Calc): 162 mg/dL (calc) — ABNORMAL HIGH (ref <130)
Total CHOL/HDL Ratio: 5.6 (calc) — ABNORMAL HIGH (ref <5.0)
Triglycerides: 224 mg/dL — ABNORMAL HIGH (ref <150)

## 2020-03-17 LAB — HEMOGLOBIN A1C
Hgb A1c MFr Bld: 6 % of total Hgb — ABNORMAL HIGH (ref <5.7)
Mean Plasma Glucose: 126 (calc)
eAG (mmol/L): 7 (calc)

## 2020-03-17 LAB — VITAMIN B12: Vitamin B-12: 407 pg/mL (ref 200–1100)

## 2020-03-17 LAB — TSH: TSH: 3.21 mIU/L (ref 0.40–4.50)

## 2020-03-17 LAB — TESTOSTERONE: Testosterone: 660 ng/dL (ref 250–827)

## 2020-03-17 LAB — PSA: PSA: 2.5 ng/mL (ref <=4.0)

## 2020-03-23 ENCOUNTER — Ambulatory Visit (INDEPENDENT_AMBULATORY_CARE_PROVIDER_SITE_OTHER): Payer: Medicare PPO | Admitting: Medical-Surgical

## 2020-03-23 DIAGNOSIS — E291 Testicular hypofunction: Secondary | ICD-10-CM

## 2020-03-23 MED ORDER — TESTOSTERONE CYPIONATE 200 MG/ML IM SOLN
200.0000 mg | Freq: Once | INTRAMUSCULAR | Status: AC
  Administered 2020-03-23: 200 mg via INTRAMUSCULAR

## 2020-03-23 NOTE — Progress Notes (Signed)
Pt here for testosterone injection no SOB,CP or mood swings. Injection tolerated well given in LUOQ. He will return to clinic in 3 weeks for next injection.  Pt's BP was elevated he reports that he did not take his medication prior to coming in this morning and that he was rushing and got caught in traffic. He said that when he takes his BP at home his readings are much better.    While he was here I did discuss Dr. Zigmund Daniel recommendations for him about starting Atorvastatin 20 mg. He stated that he does have some on hand and when he runs out he will call the pharmacy for a refill.

## 2020-04-13 ENCOUNTER — Other Ambulatory Visit: Payer: Self-pay

## 2020-04-13 ENCOUNTER — Ambulatory Visit (INDEPENDENT_AMBULATORY_CARE_PROVIDER_SITE_OTHER): Payer: Medicare PPO | Admitting: Family Medicine

## 2020-04-13 VITALS — BP 130/80 | HR 68

## 2020-04-13 DIAGNOSIS — E291 Testicular hypofunction: Secondary | ICD-10-CM

## 2020-04-13 DIAGNOSIS — Z23 Encounter for immunization: Secondary | ICD-10-CM | POA: Diagnosis not present

## 2020-04-13 DIAGNOSIS — G4733 Obstructive sleep apnea (adult) (pediatric): Secondary | ICD-10-CM | POA: Diagnosis not present

## 2020-04-13 MED ORDER — TESTOSTERONE CYPIONATE 200 MG/ML IM SOLN
200.0000 mg | Freq: Once | INTRAMUSCULAR | Status: AC
Start: 2020-04-13 — End: 2020-04-13
  Administered 2020-04-13: 200 mg via INTRAMUSCULAR

## 2020-04-13 NOTE — Progress Notes (Signed)
   Subjective:    Patient ID: Jonathan Yu, male    DOB: 06/05/1951, 69 y.o.   MRN: [id number]  HPI Patient is here for a testosterone injection. Denies chest pain, shortness of breath, headaches, problems with mood change, or medication problems.   Review of Systems     Objective:   Physical Exam        Assessment & Plan:  Patient tolerated injection in RUOQ well without complications. Patient advised to schedule his next injection for 3 weeks from today.  HM: High dose flu given today

## 2020-04-13 NOTE — Progress Notes (Signed)
Medical screening examination/treatment was performed by qualified clinical staff member and as supervising physician I was immediately available for consultation/collaboration. I have reviewed documentation and agree with assessment and plan.  Lilburn Straw, DO  

## 2020-05-04 ENCOUNTER — Other Ambulatory Visit: Payer: Self-pay

## 2020-05-04 ENCOUNTER — Ambulatory Visit (INDEPENDENT_AMBULATORY_CARE_PROVIDER_SITE_OTHER): Payer: Medicare PPO | Admitting: Family Medicine

## 2020-05-04 VITALS — BP 132/67 | HR 69 | Temp 98.2°F | Wt 269.0 lb

## 2020-05-04 DIAGNOSIS — E291 Testicular hypofunction: Secondary | ICD-10-CM

## 2020-05-04 MED ORDER — TESTOSTERONE CYPIONATE 200 MG/ML IM SOLN
200.0000 mg | Freq: Once | INTRAMUSCULAR | Status: AC
  Administered 2020-05-04: 200 mg via INTRAMUSCULAR

## 2020-05-04 NOTE — Progress Notes (Signed)
Patient is here for a testosterone injection. Denies chest pain, shortness of breath, headaches and problems with medication or mood changes.   Patient tolerated injection well on LUOQ without any complications. Patient advised to schedule next injection in 21 days.

## 2020-05-04 NOTE — Progress Notes (Signed)
Medical screening examination/treatment was performed by qualified clinical staff member and as supervising physician I was immediately available for consultation/collaboration. I have reviewed documentation and agree with assessment and plan.  Corbett Moulder, DO  

## 2020-05-13 DIAGNOSIS — G4733 Obstructive sleep apnea (adult) (pediatric): Secondary | ICD-10-CM | POA: Diagnosis not present

## 2020-05-25 ENCOUNTER — Ambulatory Visit (INDEPENDENT_AMBULATORY_CARE_PROVIDER_SITE_OTHER): Payer: Medicare PPO | Admitting: Family Medicine

## 2020-05-25 VITALS — BP 130/71 | HR 73 | Wt 267.0 lb

## 2020-05-25 DIAGNOSIS — E291 Testicular hypofunction: Secondary | ICD-10-CM | POA: Diagnosis not present

## 2020-05-25 MED ORDER — TESTOSTERONE CYPIONATE 200 MG/ML IM SOLN
200.0000 mg | Freq: Once | INTRAMUSCULAR | Status: AC
Start: 2020-05-25 — End: 2020-05-25
  Administered 2020-05-25: 200 mg via INTRAMUSCULAR

## 2020-05-25 NOTE — Progress Notes (Signed)
Medical screening examination/treatment was performed by qualified clinical staff member and as supervising physician I was immediately available for consultation/collaboration. I have reviewed documentation and agree with assessment and plan.  Jolea Dolle, DO  

## 2020-05-25 NOTE — Progress Notes (Signed)
   Subjective:    Patient ID: Jonathan Yu, male    DOB: 1950-09-06, 69 y.o.   MRN: [id number]  HPI Patient is here for a testosterone injection.  Denies chest pain, shortness of breath, headaches and problems with medication or mood changes.    Review of Systems     Objective:   Physical Exam        Assessment & Plan:  Patient tolerated injection well without complications.  Patient advised to schedule next injection in 3 weeks.

## 2020-06-13 DIAGNOSIS — G4733 Obstructive sleep apnea (adult) (pediatric): Secondary | ICD-10-CM | POA: Diagnosis not present

## 2020-06-15 ENCOUNTER — Other Ambulatory Visit: Payer: Self-pay

## 2020-06-15 ENCOUNTER — Ambulatory Visit (INDEPENDENT_AMBULATORY_CARE_PROVIDER_SITE_OTHER): Payer: Medicare PPO | Admitting: Family Medicine

## 2020-06-15 VITALS — BP 151/73 | HR 71 | Wt 272.0 lb

## 2020-06-15 DIAGNOSIS — E291 Testicular hypofunction: Secondary | ICD-10-CM | POA: Diagnosis not present

## 2020-06-15 MED ORDER — TESTOSTERONE CYPIONATE 200 MG/ML IM SOLN
200.0000 mg | Freq: Once | INTRAMUSCULAR | Status: AC
  Administered 2020-06-15: 200 mg via INTRAMUSCULAR

## 2020-06-15 NOTE — Progress Notes (Signed)
Medical screening examination/treatment was performed by qualified clinical staff member and as supervising physician I was immediately available for consultation/collaboration. I have reviewed documentation and agree with assessment and plan.  Annora Guderian, DO  

## 2020-06-15 NOTE — Progress Notes (Signed)
   Subjective:    Patient ID: Jonathan Yu, male    DOB: [birth year], 69 y.o.   MRN: [id number]  HPI Patient is here for a testosterone injection. Denies chest pain, shortness of breath, headaches, problems with mood change, or medication problems.    Review of Systems     Objective:   Physical Exam        Assessment & Plan:  Patient tolerated injection in LUOQ well without complications. Patient advised to schedule his next injection for 3 weeks from today.  Patient states he has had some "fatigue shakes" in the past in his right leg and left shoulder and they seem to be more frequent. Advised him to schedule appt with PCP to discuss. Patient's blood pressure was also elevated today and he declined to stay in office for re-check because he had another appointment.

## 2020-07-05 ENCOUNTER — Encounter: Payer: Self-pay | Admitting: Family Medicine

## 2020-07-05 ENCOUNTER — Ambulatory Visit (INDEPENDENT_AMBULATORY_CARE_PROVIDER_SITE_OTHER): Payer: Medicare PPO | Admitting: Family Medicine

## 2020-07-05 ENCOUNTER — Ambulatory Visit: Payer: Medicare PPO

## 2020-07-05 ENCOUNTER — Other Ambulatory Visit: Payer: Self-pay

## 2020-07-05 VITALS — BP 146/72 | HR 66 | Temp 98.0°F | Wt 264.9 lb

## 2020-07-05 DIAGNOSIS — G933 Postviral fatigue syndrome: Secondary | ICD-10-CM | POA: Diagnosis not present

## 2020-07-05 DIAGNOSIS — E559 Vitamin D deficiency, unspecified: Secondary | ICD-10-CM

## 2020-07-05 DIAGNOSIS — E785 Hyperlipidemia, unspecified: Secondary | ICD-10-CM | POA: Diagnosis not present

## 2020-07-05 DIAGNOSIS — E291 Testicular hypofunction: Secondary | ICD-10-CM

## 2020-07-05 DIAGNOSIS — I10 Essential (primary) hypertension: Secondary | ICD-10-CM | POA: Diagnosis not present

## 2020-07-05 DIAGNOSIS — G9331 Postviral fatigue syndrome: Secondary | ICD-10-CM | POA: Insufficient documentation

## 2020-07-05 MED ORDER — TESTOSTERONE CYPIONATE 200 MG/ML IM SOLN
200.0000 mg | INTRAMUSCULAR | Status: DC
  Administered 2020-07-05 – 2022-08-01 (×22): 200 mg via INTRAMUSCULAR

## 2020-07-05 NOTE — Assessment & Plan Note (Signed)
Will reduce testosterone frequency to every 2 weeks to see if this improves his symptoms of fatigue.  Will also check estradiol levels.

## 2020-07-05 NOTE — Assessment & Plan Note (Signed)
BP mildly elevated in clinic today, remains well controlled at home.  Continue current losartan at current dose.

## 2020-07-05 NOTE — Assessment & Plan Note (Signed)
He may have fatigue related to post-covid syndrome.  He is also having episodes of leg and shoulder shaking.  Will see if changing testosterone dose makes a difference in his fatigue.  Discussed referral to neurology for leg shaking and addition of PT if symptoms persist.

## 2020-07-05 NOTE — Assessment & Plan Note (Signed)
He is tolerating atorvastatin well, continue at current strength. 

## 2020-07-05 NOTE — Patient Instructions (Signed)
Let's try doing testosterone every 2 weeks.   Have labs completed around the first of the year.  If shaking/fatigue persists I think we should get you in with PT and neurology.

## 2020-07-05 NOTE — Progress Notes (Signed)
Patient is here for a testosterone injection of 200mg.   Given in RUOQ.  Denies chest pain, shortness of breath, headaches and problems with medication or mood changes.   Tolerated injection well without complications.   Patient advised to schedule next injection in 14 days.  

## 2020-07-05 NOTE — Progress Notes (Signed)
Jonathan Yu - 69 y.o. male MRN [id number]  Date of birth: 01-11-1951  Subjective Chief Complaint  Patient presents with  . Fatigue    HPI Jonathan Yu is a 69 y.o. male with history of HTN, low testosterone, HLD and BPH here today for follow up visit.    He has concern about fatigue and shaking in R leg and L shoulder.  This has been going on for a few months.  He feels like this started after he had COVID in December.  Leg and arm feel like he overworked them and start shaking, this occurs at random times, sometimes with sitting and sometimes when up and walking.   He feels tired often and muscles feel fatigued.  He feels like he is sleeping well and is using CPAP regularly.   BP managed with losartan.  He is tolerating this well.  BP readings at home are well controlled.  He had not had chest pain, shortness of breath,palpitations, headache or vision changes.    ROS:  A comprehensive ROS was completed and negative except as noted per HPI  Allergies  Allergen Reactions  . Sesame Oil Hives and Shortness Of Breath  . Clonazepam Hives  . Percocet [Oxycodone-Acetaminophen] Rash  . Topamax [Topiramate]     Cognitive decline  . Vicodin [Hydrocodone-Acetaminophen] Rash    Past Medical History:  Diagnosis Date  . Allergy   . DDD (degenerative disc disease), lumbar   . Hyperlipidemia    "no meds since losing 106# 2 yr ago" (10/06/2013)  . Hypertension 2012   "no meds since losing 106# 2 yr ago" (10/06/2013)  . OSA on CPAP    "don't wear mask much since losing 106# 2 yr ago" (10/06/2013)  . Type II diabetes mellitus ([place])    "no meds since losing 106# 2 yr ago" (10/06/2013)  . Walking pneumonia 1968    Past Surgical History:  Procedure Laterality Date  . ANKLE FRACTURE SURGERY Left 1973  . ANKLE RECONSTRUCTION Right 05/2011  . CARPAL TUNNEL RELEASE Bilateral 2001  . HARDWARE REMOVAL Right 10/07/2013   Procedure: RIGHT ANKLE HARDWARE REMOVAL;  Surgeon: Newt Minion, MD;   Location: [place];  Service: Orthopedics;  Laterality: Right;  . I & D EXTREMITY Right 11/18/2013   Procedure: IRRIGATION AND DEBRIDEMENT EXTREMITY;  Surgeon: Newt Minion, MD;  Location: [place];  Service: Orthopedics;  Laterality: Right;  Irrigation and Debridement Right Fibula , Place Antibiotic Beads and VAC  . NASAL SEPTUM SURGERY  1982  . ORIF ANKLE FRACTURE Right 09/08/2013   Procedure: REPAIR SYNDESMOSIS DISRUPTION RIGHT ANKLE;  Surgeon: Newt Minion, MD;  Location: [place];  Service: Orthopedics;  Laterality: Right;  . SHOULDER ARTHROSCOPY W/ ROTATOR CUFF REPAIR Right 2006; 2009  . TONSILLECTOMY  1959  . TOTAL ANKLE ARTHROPLASTY Right 08/18/2013   Procedure: TOTAL ANKLE ARTHOPLASTY;  Surgeon: Newt Minion, MD;  Location: [place];  Service: Orthopedics;  Laterality: Right;  Right Total Ankle Arthroplasty, Revision Fibular Fracture  . URETERAL STENT PLACEMENT  ~ 2010 X 2    Social History   Socioeconomic History  . Marital status: Divorced    Spouse name: Not on file  . Number of children: 5  . Years of education: Not on file  . Highest education level: Not on file  Occupational History  . Occupation: sales/ DJ  Tobacco Use  . Smoking status: Current Some Day Smoker    Years: 6.00    Types: Pipe, Cigars  .  Smokeless tobacco: Never Used  . Tobacco comment: 10/06/2013 "use pipe or cigar 1-2 times/month"  Substance and Sexual Activity  . Alcohol use: Yes    Comment: 10/06/2013 "have 4-5 drinks/yr"  . Drug use: No  . Sexual activity: Yes    Comment: 1 every 2 months  Other Topics Concern  . Not on file  Social History Narrative  . Not on file   Social Determinants of Health   Financial Resource Strain:   . Difficulty of Paying Living Expenses: Not on file  Food Insecurity:   . Worried About Charity fundraiser in the Last Year: Not on file  . Ran Out of Food in the Last Year: Not on file  Transportation Needs:   . Lack of Transportation (Medical): Not on file  . Lack of  Transportation (Non-Medical): Not on file  Physical Activity:   . Days of Exercise per Week: Not on file  . Minutes of Exercise per Session: Not on file  Stress:   . Feeling of Stress : Not on file  Social Connections:   . Frequency of Communication with Friends and Family: Not on file  . Frequency of Social Gatherings with Friends and Family: Not on file  . Attends Religious Services: Not on file  . Active Member of Clubs or Organizations: Not on file  . Attends Archivist Meetings: Not on file  . Marital Status: Not on file    Family History  Problem Relation Age of Onset  . Cancer Mother 58       Lung  . Alzheimer's disease Father   . Parkinson's disease Father   . Scoliosis Sister   . Cleft lip Sister     Health Maintenance  Topic Date Due  . Samul Dada  04/24/2021  . COLONOSCOPY  06/03/2029  . INFLUENZA VACCINE  Completed  . COVID-19 Vaccine  Completed  . Hepatitis C Screening  Completed  . PNA vac Low Risk Adult  Completed     ----------------------------------------------------------------------------------------------------------------------------------------------------------------------------------------------------------------- Physical Exam BP (!) 146/72 (BP Location: Left Arm, Patient Position: Sitting, Cuff Size: Large)   Pulse 66   Temp 98 F (36.7 C)   Wt 264 lb 14.4 oz (120.2 kg)   SpO2 95%   BMI 42.57 kg/m   Physical Exam Constitutional:      Appearance: Normal appearance.  HENT:     Head: Normocephalic and atraumatic.  Eyes:     General: No scleral icterus. Cardiovascular:     Rate and Rhythm: Normal rate and regular rhythm.  Musculoskeletal:     Cervical back: Neck supple.  Skin:    General: Skin is warm and dry.  Neurological:     General: No focal deficit present.     Mental Status: He is alert and oriented to person, place, and time.     Motor: No weakness.     Coordination: Coordination normal.     Gait: Gait normal.      Deep Tendon Reflexes: Reflexes normal.  Psychiatric:        Mood and Affect: Mood normal.        Behavior: Behavior normal.     ------------------------------------------------------------------------------------------------------------------------------------------------------------------------------------------------------------------- Assessment and Plan  Essential hypertension, benign BP mildly elevated in clinic today, remains well controlled at home.  Continue current losartan at current dose.     Postviral fatigue syndrome He may have fatigue related to post-covid syndrome.  He is also having episodes of leg and shoulder shaking.  Will see if changing testosterone  dose makes a difference in his fatigue.  Discussed referral to neurology for leg shaking and addition of PT if symptoms persist.   Dyslipidemia He is tolerating atorvastatin well, continue at current strength.    Hypogonadism in male Will reduce testosterone frequency to every 2 weeks to see if this improves his symptoms of fatigue.  Will also check estradiol levels.    Meds ordered this encounter  Medications  . testosterone cypionate (DEPOTESTOSTERONE CYPIONATE) injection 200 mg    Return in about 2 weeks (around 07/19/2020) for Testosterone Injection.    This visit occurred during the SARS-CoV-2 public health emergency.  Safety protocols were in place, including screening questions prior to the visit, additional usage of staff PPE, and extensive cleaning of exam room while observing appropriate contact time as indicated for disinfecting solutions.

## 2020-07-13 DIAGNOSIS — G4733 Obstructive sleep apnea (adult) (pediatric): Secondary | ICD-10-CM | POA: Diagnosis not present

## 2020-07-19 ENCOUNTER — Ambulatory Visit: Payer: Medicare PPO

## 2020-07-20 ENCOUNTER — Ambulatory Visit (INDEPENDENT_AMBULATORY_CARE_PROVIDER_SITE_OTHER): Payer: Medicare PPO | Admitting: Family Medicine

## 2020-07-20 VITALS — BP 132/84 | HR 84

## 2020-07-20 DIAGNOSIS — E291 Testicular hypofunction: Secondary | ICD-10-CM | POA: Diagnosis not present

## 2020-07-20 MED ORDER — TESTOSTERONE CYPIONATE 200 MG/ML IM SOLN
200.0000 mg | Freq: Once | INTRAMUSCULAR | Status: AC
  Administered 2020-07-20: 200 mg via INTRAMUSCULAR

## 2020-07-20 NOTE — Progress Notes (Signed)
Medical screening examination/treatment was performed by qualified clinical staff member and as supervising physician I was immediately available for consultation/collaboration. I have reviewed documentation and agree with assessment and plan.  Raquon Milledge, DO  

## 2020-07-20 NOTE — Progress Notes (Signed)
Established Patient Office Visit  Subjective:  Patient ID: Jonathan Yu, male    DOB: 23-Oct-1950  Age: 69 y.o. MRN: [id number]  CC:  Chief Complaint  Patient presents with  . Hypogonadism    HPI Jonathan Yu presents for Testosterone injection. Denies chest pain, shortness of breath or mood changes.   Past Medical History:  Diagnosis Date  . Allergy   . DDD (degenerative disc disease), lumbar   . Hyperlipidemia    "no meds since losing 106# 2 yr ago" (10/06/2013)  . Hypertension 2012   "no meds since losing 106# 2 yr ago" (10/06/2013)  . OSA on CPAP    "don't wear mask much since losing 106# 2 yr ago" (10/06/2013)  . Type II diabetes mellitus ([place])    "no meds since losing 106# 2 yr ago" (10/06/2013)  . Walking pneumonia 1968    Past Surgical History:  Procedure Laterality Date  . ANKLE FRACTURE SURGERY Left 1973  . ANKLE RECONSTRUCTION Right 05/2011  . CARPAL TUNNEL RELEASE Bilateral 2001  . HARDWARE REMOVAL Right 10/07/2013   Procedure: RIGHT ANKLE HARDWARE REMOVAL;  Surgeon: Newt Minion, MD;  Location: [place];  Service: Orthopedics;  Laterality: Right;  . I & D EXTREMITY Right 11/18/2013   Procedure: IRRIGATION AND DEBRIDEMENT EXTREMITY;  Surgeon: Newt Minion, MD;  Location: [place];  Service: Orthopedics;  Laterality: Right;  Irrigation and Debridement Right Fibula , Place Antibiotic Beads and VAC  . NASAL SEPTUM SURGERY  1982  . ORIF ANKLE FRACTURE Right 09/08/2013   Procedure: REPAIR SYNDESMOSIS DISRUPTION RIGHT ANKLE;  Surgeon: Newt Minion, MD;  Location: [place];  Service: Orthopedics;  Laterality: Right;  . SHOULDER ARTHROSCOPY W/ ROTATOR CUFF REPAIR Right 2006; 2009  . TONSILLECTOMY  1959  . TOTAL ANKLE ARTHROPLASTY Right 08/18/2013   Procedure: TOTAL ANKLE ARTHOPLASTY;  Surgeon: Newt Minion, MD;  Location: [place];  Service: Orthopedics;  Laterality: Right;  Right Total Ankle Arthroplasty, Revision Fibular Fracture  . URETERAL STENT PLACEMENT  ~ 2010 X 2     Family History  Problem Relation Age of Onset  . Cancer Mother 78       Lung  . Alzheimer's disease Father   . Parkinson's disease Father   . Scoliosis Sister   . Cleft lip Sister     Social History   Socioeconomic History  . Marital status: Divorced    Spouse name: Not on file  . Number of children: 5  . Years of education: Not on file  . Highest education level: Not on file  Occupational History  . Occupation: sales/ DJ  Tobacco Use  . Smoking status: Current Some Day Smoker    Years: 6.00    Types: Pipe, Cigars  . Smokeless tobacco: Never Used  . Tobacco comment: 10/06/2013 "use pipe or cigar 1-2 times/month"  Substance and Sexual Activity  . Alcohol use: Yes    Comment: 10/06/2013 "have 4-5 drinks/yr"  . Drug use: No  . Sexual activity: Yes    Comment: 1 every 2 months  Other Topics Concern  . Not on file  Social History Narrative  . Not on file   Social Determinants of Health   Financial Resource Strain: Not on file  Food Insecurity: Not on file  Transportation Needs: Not on file  Physical Activity: Not on file  Stress: Not on file  Social Connections: Not on file  Intimate Partner Violence: Not on file    Outpatient Medications Prior to  Visit  Medication Sig Dispense Refill  . albuterol (PROVENTIL HFA;VENTOLIN HFA) 108 (90 Base) MCG/ACT inhaler Inhale 2 puffs into the lungs every 6 (six) hours as needed for wheezing or shortness of breath. 1 Inhaler 0  . AMBULATORY NON FORMULARY MEDICATION Continue CPAP at current settings.  Please provide new supplies. Micheal Likens phone number [phone] 1 each 0  . AMBULATORY NON FORMULARY MEDICATION Compression stockings.  Vive size LGM Leg swelling. Disp 1 pair 99 refil. 1 each 99  . atorvastatin (LIPITOR) 40 MG tablet     . Azelaic Acid 15 % cream Apply 1 application topically 2 (two) times daily. 50 g 12  . docusate sodium (COLACE) 50 MG capsule Take 50 mg by mouth daily.    . fluticasone (FLONASE) 50  MCG/ACT nasal spray Place into both nostrils daily.    Marland Kitchen loratadine (CLARITIN) 10 MG tablet Take 10 mg by mouth daily.    Marland Kitchen losartan (COZAAR) 50 MG tablet Take 1 tablet (50 mg total) by mouth daily. 90 tablet 3  . Misc. Devices (FLEX THERAPY) MISC by Does not apply route.    . Multiple Vitamin (MULTIVITAMIN WITH MINERALS) TABS tablet Take 1 tablet by mouth daily.    . Probiotic Product (PROBIOTIC PO) Take by mouth daily.    . tadalafil (CIALIS) 20 MG tablet TAKE HALF TO ONE TABLET BY MOUTH EVERY OTHER DAY AS NEEDED FOR ERECTILE DYSFUNCTION 30 tablet 12  . tamsulosin (FLOMAX) 0.4 MG CAPS capsule Take 1 capsule (0.4 mg total) by mouth daily after breakfast. 90 capsule 3   Facility-Administered Medications Prior to Visit  Medication Dose Route Frequency Provider Last Rate Last Admin  . testosterone cypionate (DEPOTESTOSTERONE CYPIONATE) injection 200 mg  200 mg Intramuscular Q21 days Luetta Nutting, DO   200 mg at 07/05/20 1048    Allergies  Allergen Reactions  . Sesame Oil Hives and Shortness Of Breath  . Clonazepam Hives  . Percocet [Oxycodone-Acetaminophen] Rash  . Topamax [Topiramate]     Cognitive decline  . Vicodin [Hydrocodone-Acetaminophen] Rash    ROS Review of Systems    Objective:    Physical Exam  BP 132/84   Pulse 84  Wt Readings from Last 3 Encounters:  07/05/20 264 lb 14.4 oz (120.2 kg)  06/15/20 272 lb (123.4 kg)  05/25/20 267 lb (121.1 kg)     Health Maintenance Due  Topic Date Due  . COVID-19 Vaccine (3 - Pfizer risk 4-dose series) 12/08/2019    There are no preventive care reminders to display for this patient.  Lab Results  Component Value Date   TSH 3.21 03/16/2020   Lab Results  Component Value Date   WBC 7.4 07/31/2019   HGB 16.6 07/31/2019   HCT 49.4 07/31/2019   MCV 87.1 07/31/2019   PLT 201 07/31/2019   Lab Results  Component Value Date   NA 137 03/16/2020   K 4.2 03/16/2020   CO2 29 03/16/2020   GLUCOSE 122 (H) 03/16/2020   BUN  12 03/16/2020   CREATININE 1.03 03/16/2020   BILITOT 2.2 (H) 03/16/2020   ALKPHOS 39 (L) 01/07/2017   AST 21 03/16/2020   ALT 30 03/16/2020   PROT 6.9 03/16/2020   ALBUMIN 3.5 (L) 01/07/2017   CALCIUM 9.3 03/16/2020   ANIONGAP 11 07/31/2019   Lab Results  Component Value Date   CHOL 197 03/16/2020   Lab Results  Component Value Date   HDL 35 (L) 03/16/2020   Lab Results  Component Value Date  [place] 126 (H) 03/16/2020   Lab Results  Component Value Date   TRIG 224 (H) 03/16/2020   Lab Results  Component Value Date   CHOLHDL 5.6 (H) 03/16/2020   Lab Results  Component Value Date   HGBA1C 6.0 (H) 03/16/2020      Assessment & Plan:  HYPOGONADISM Patient tolerated injection well without complications. Patient advised to return in 14 days for next injection.  Problem List Items Addressed This Visit      Endocrine   Hypogonadism in male - Primary      Meds ordered this encounter  Medications  . testosterone cypionate (DEPOTESTOSTERONE CYPIONATE) injection 200 mg    Follow-up: Return in about 2 weeks (around 08/03/2020) for testosterone.    Vivia Birmingham, RMA

## 2020-07-31 ENCOUNTER — Telehealth: Payer: Self-pay | Admitting: Family Medicine

## 2020-07-31 NOTE — Telephone Encounter (Signed)
Patient came in and wanted to be seen in person for a sore throat and a slight temp of 99.1, I offered virtual & patient stated he wanted to be seen in person to be swabbed. I let patient know that we usually do a virtual and then if he needed to be swabbed, the provider would decide that. He still wanted in person so I went and spoke with Levada Dy in Triage and she also agreed to do virtual since he had not been seen for this in person, we are doing virtuals first then bringing in office later on if symptoms don't get better. I let patient know what Triage said and he didn't want to schedule a virtual visit. He wanted to wait and possibly go to an urgent care. Patient also has an appt on 12/23 for a B12 shot but we did not reschedule this. AM

## 2020-08-01 ENCOUNTER — Telehealth (INDEPENDENT_AMBULATORY_CARE_PROVIDER_SITE_OTHER): Payer: Medicare PPO | Admitting: Family Medicine

## 2020-08-01 ENCOUNTER — Encounter: Payer: Self-pay | Admitting: Family Medicine

## 2020-08-01 DIAGNOSIS — J029 Acute pharyngitis, unspecified: Secondary | ICD-10-CM | POA: Insufficient documentation

## 2020-08-01 DIAGNOSIS — G4700 Insomnia, unspecified: Secondary | ICD-10-CM | POA: Insufficient documentation

## 2020-08-01 DIAGNOSIS — F5104 Psychophysiologic insomnia: Secondary | ICD-10-CM

## 2020-08-01 MED ORDER — ALPRAZOLAM 0.5 MG PO TABS
0.2500 mg | ORAL_TABLET | Freq: Every evening | ORAL | 3 refills | Status: DC | PRN
Start: 2020-08-01 — End: 2022-01-16

## 2020-08-01 MED ORDER — AZITHROMYCIN 250 MG PO TABS: ORAL_TABLET | ORAL | 0 refills | Status: DC

## 2020-08-01 NOTE — Progress Notes (Signed)
Jonathan Yu - 69 y.o. male MRN [id number]  Date of birth: [birth year]   This visit type was conducted due to national recommendations for restrictions regarding the COVID-19 Pandemic (e.g. social distancing).  This format is felt to be most appropriate for this patient at this time.  All issues noted in this document were discussed and addressed.  No physical exam was performed (except for noted visual exam findings with Video Visits).  I discussed the limitations of evaluation and management by telemedicine and the availability of in person appointments. The patient expressed understanding and agreed to proceed.  I connected with@ on 08/01/20 at  9:30 AM EST by a video enabled telemedicine application and verified that I am speaking with the correct person using two identifiers.  Present at visit: Luetta Nutting, DO [name]   Patient Location: Home [address] [address]   Provider location:   [facility] [place]  Chief Complaint  Patient presents with  . Sore Throat    HPI  Jonathan Yu is a 69 y.o. male who presents via audio/video conferencing for a telehealth visit today.  Neck has complaint of sore throat which started about 4 days ago.  He has had mildly elevated temperature into the 99's.  He has not had body aches, nausea, vomiting, headaches, cough shortness of breath.  He is concerned that he may have strep throat.  He does feel like he has some tender lymph nodes on his neck.  He also reports continued anxiety with difficulty sleeping.  He has used alprazolam in the past to help with this and would like to restart this.  He does not feel he needs to take anything every night just on nights where he has extreme difficulty falling asleep.     ROS:  A comprehensive ROS was completed and negative except as noted per HPI  Past Medical History:  Diagnosis Date  . Allergy   . DDD (degenerative disc disease), lumbar   . Hyperlipidemia    "no meds since losing  106# 2 yr ago" (10/06/2013)  . Hypertension 2012   "no meds since losing 106# 2 yr ago" (10/06/2013)  . OSA on CPAP    "don't wear mask much since losing 106# 2 yr ago" (10/06/2013)  . Type II diabetes mellitus ([place])    "no meds since losing 106# 2 yr ago" (10/06/2013)  . Walking pneumonia 1968    Past Surgical History:  Procedure Laterality Date  . ANKLE FRACTURE SURGERY Left 1973  . ANKLE RECONSTRUCTION Right 05/2011  . CARPAL TUNNEL RELEASE Bilateral 2001  . HARDWARE REMOVAL Right 10/07/2013   Procedure: RIGHT ANKLE HARDWARE REMOVAL;  Surgeon: Newt Minion, MD;  Location: [place];  Service: Orthopedics;  Laterality: Right;  . I & D EXTREMITY Right 11/18/2013   Procedure: IRRIGATION AND DEBRIDEMENT EXTREMITY;  Surgeon: Newt Minion, MD;  Location: [place];  Service: Orthopedics;  Laterality: Right;  Irrigation and Debridement Right Fibula , Place Antibiotic Beads and VAC  . NASAL SEPTUM SURGERY  1982  . ORIF ANKLE FRACTURE Right 09/08/2013   Procedure: REPAIR SYNDESMOSIS DISRUPTION RIGHT ANKLE;  Surgeon: Newt Minion, MD;  Location: [place];  Service: Orthopedics;  Laterality: Right;  . SHOULDER ARTHROSCOPY W/ ROTATOR CUFF REPAIR Right 2006; 2009  . TONSILLECTOMY  1959  . TOTAL ANKLE ARTHROPLASTY Right 08/18/2013   Procedure: TOTAL ANKLE ARTHOPLASTY;  Surgeon: Newt Minion, MD;  Location: [place];  Service: Orthopedics;  Laterality: Right;  Right Total  Ankle Arthroplasty, Revision Fibular Fracture  . URETERAL STENT PLACEMENT  ~ 2010 X 2    Family History  Problem Relation Age of Onset  . Cancer Mother 74       Lung  . Alzheimer's disease Father   . Parkinson's disease Father   . Scoliosis Sister   . Cleft lip Sister     Social History   Socioeconomic History  . Marital status: Divorced    Spouse name: Not on file  . Number of children: 5  . Years of education: Not on file  . Highest education level: Not on file  Occupational History  . Occupation: sales/ DJ  Tobacco Use  .  Smoking status: Current Some Day Smoker    Years: 6.00    Types: Pipe, Cigars  . Smokeless tobacco: Never Used  . Tobacco comment: 10/06/2013 "use pipe or cigar 1-2 times/month"  Substance and Sexual Activity  . Alcohol use: Yes    Comment: 10/06/2013 "have 4-5 drinks/yr"  . Drug use: No  . Sexual activity: Yes    Comment: 1 every 2 months  Other Topics Concern  . Not on file  Social History Narrative  . Not on file   Social Determinants of Health   Financial Resource Strain: Not on file  Food Insecurity: Not on file  Transportation Needs: Not on file  Physical Activity: Not on file  Stress: Not on file  Social Connections: Not on file  Intimate Partner Violence: Not on file     Current Outpatient Medications:  .  albuterol (PROVENTIL HFA;VENTOLIN HFA) 108 (90 Base) MCG/ACT inhaler, Inhale 2 puffs into the lungs every 6 (six) hours as needed for wheezing or shortness of breath., Disp: 1 Inhaler, Rfl: 0 .  AMBULATORY NON FORMULARY MEDICATION, Continue CPAP at current settings.  Please provide new supplies. Micheal Likens phone number [phone], Disp: 1 each, Rfl: 0 .  AMBULATORY NON FORMULARY MEDICATION, Compression stockings.  Vive size LGM Leg swelling. Disp 1 pair 99 refil., Disp: 1 each, Rfl: 99 .  atorvastatin (LIPITOR) 40 MG tablet, , Disp: , Rfl:  .  Azelaic Acid 15 % cream, Apply 1 application topically 2 (two) times daily., Disp: 50 g, Rfl: 12 .  docusate sodium (COLACE) 50 MG capsule, Take 50 mg by mouth daily., Disp: , Rfl:  .  fluticasone (FLONASE) 50 MCG/ACT nasal spray, Place into both nostrils daily., Disp: , Rfl:  .  loratadine (CLARITIN) 10 MG tablet, Take 10 mg by mouth daily., Disp: , Rfl:  .  losartan (COZAAR) 50 MG tablet, Take 1 tablet (50 mg total) by mouth daily., Disp: 90 tablet, Rfl: 3 .  Misc. Devices (FLEX THERAPY) MISC, by Does not apply route., Disp: , Rfl:  .  Multiple Vitamin (MULTIVITAMIN WITH MINERALS) TABS tablet, Take 1 tablet by mouth  daily., Disp: , Rfl:  .  Probiotic Product (PROBIOTIC PO), Take by mouth daily., Disp: , Rfl:  .  tadalafil (CIALIS) 20 MG tablet, TAKE HALF TO ONE TABLET BY MOUTH EVERY OTHER DAY AS NEEDED FOR ERECTILE DYSFUNCTION, Disp: 30 tablet, Rfl: 12 .  tamsulosin (FLOMAX) 0.4 MG CAPS capsule, Take 1 capsule (0.4 mg total) by mouth daily after breakfast., Disp: 90 capsule, Rfl: 3 .  ALPRAZolam (XANAX) 0.5 MG tablet, Take 0.5-1 tablets (0.25-0.5 mg total) by mouth at bedtime as needed for sleep., Disp: 20 tablet, Rfl: 3 .  azithromycin (ZITHROMAX) 250 MG tablet, Take 2 tabs on day 1 followed by 1 tab on days  2-5, Disp: 6 tablet, Rfl: 0  Current Facility-Administered Medications:  .  testosterone cypionate (DEPOTESTOSTERONE CYPIONATE) injection 200 mg, 200 mg, Intramuscular, Q21 days, Luetta Nutting, DO, 200 mg at 07/05/20 1048  EXAM:  VITALS per patient if applicable: Wt 264 lb (119.7 kg)   BMI 42.43 kg/m   GENERAL: alert, oriented, appears well and in no acute distress  HEENT: atraumatic, conjunttiva clear, no obvious abnormalities on inspection of external nose and ears  NECK: normal movements of the head and neck  LUNGS: on inspection no signs of respiratory distress, breathing rate appears normal, no obvious gross SOB, gasping or wheezing  CV: no obvious cyanosis  MS: moves all visible extremities without noticeable abnormality  PSYCH/NEURO: pleasant and cooperative, no obvious depression or anxiety, speech and thought processing grossly intact  ASSESSMENT AND PLAN:  Discussed the following assessment and plan:  Pharyngitis I will treat him empirically for strep with azithromycin as he prefers this over amoxicillin due to shorter course. Discussed that if his symptoms continue to worsen or he develops new symptoms including cough, body aches or shortness of breath he should contact the clinic and consider having Covid testing.  Insomnia I will add alprazolam back on as he has had good  success with this previously.  We will add at low dose and he is instructed this is not for every night use and should only be reserved for those nights where he has extreme difficulty with falling asleep.     I discussed the assessment and treatment plan with the patient. The patient was provided an opportunity to ask questions and all were answered. The patient agreed with the plan and demonstrated an understanding of the instructions.   The patient was advised to call back or seek an in-person evaluation if the symptoms worsen or if the condition fails to improve as anticipated.    Luetta Nutting, DO

## 2020-08-01 NOTE — Progress Notes (Signed)
Sore throat started Saturday. Taking ibuprofen, chloraseptic, and lozenges

## 2020-08-01 NOTE — Assessment & Plan Note (Signed)
I will add alprazolam back on as he has had good success with this previously.  We will add at low dose and he is instructed this is not for every night use and should only be reserved for those nights where he has extreme difficulty with falling asleep.

## 2020-08-01 NOTE — Assessment & Plan Note (Signed)
I will treat him empirically for strep with azithromycin as he prefers this over amoxicillin due to shorter course. Discussed that if his symptoms continue to worsen or he develops new symptoms including cough, body aches or shortness of breath he should contact the clinic and consider having Covid testing.

## 2020-08-03 ENCOUNTER — Ambulatory Visit (INDEPENDENT_AMBULATORY_CARE_PROVIDER_SITE_OTHER): Payer: Medicare PPO | Admitting: Family Medicine

## 2020-08-03 VITALS — BP 140/72 | HR 68 | Wt 263.5 lb

## 2020-08-03 DIAGNOSIS — E291 Testicular hypofunction: Secondary | ICD-10-CM

## 2020-08-03 NOTE — Progress Notes (Signed)
Pt here for testosterone injection no SOB,CP or mood swings. Injection tolerated well given in RUOQ.  Pt to RTC in 2 weeks for next injection  

## 2020-08-03 NOTE — Progress Notes (Signed)
Medical screening examination/treatment was performed by qualified clinical staff member and as supervising physician I was immediately available for consultation/collaboration. I have reviewed documentation and agree with assessment and plan.  Glyndon Tursi, DO  

## 2020-08-10 ENCOUNTER — Other Ambulatory Visit: Payer: Self-pay | Admitting: Family Medicine

## 2020-08-17 ENCOUNTER — Ambulatory Visit (INDEPENDENT_AMBULATORY_CARE_PROVIDER_SITE_OTHER): Payer: Medicare HMO | Admitting: Family Medicine

## 2020-08-17 ENCOUNTER — Other Ambulatory Visit: Payer: Self-pay

## 2020-08-17 DIAGNOSIS — E291 Testicular hypofunction: Secondary | ICD-10-CM

## 2020-08-17 MED ORDER — TESTOSTERONE CYPIONATE 200 MG/ML IM SOLN
200.0000 mg | Freq: Once | INTRAMUSCULAR | Status: AC
  Administered 2020-08-17: 200 mg via INTRAMUSCULAR

## 2020-08-17 NOTE — Progress Notes (Signed)
Medical screening examination/treatment was performed by qualified clinical staff member and as supervising physician I was immediately available for consultation/collaboration. I have reviewed documentation and agree with assessment and plan.  Romari Gasparro, DO  

## 2020-08-17 NOTE — Progress Notes (Signed)
Established Patient Office Visit  Subjective:  Patient ID: Jonathan Yu, male    DOB: [birth year]  Age: 70 y.o. MRN: [id number]  CC:  Chief Complaint  Patient presents with  . Hypogonadism    HPI Jonathan Yu is here for a testosterone injection. Denies chest pain, shortness of breath, headaches or mood changes.   Past Medical History:  Diagnosis Date  . Allergy   . DDD (degenerative disc disease), lumbar   . Hyperlipidemia    "no meds since losing 106# 2 yr ago" (10/06/2013)  . Hypertension 2012   "no meds since losing 106# 2 yr ago" (10/06/2013)  . OSA on CPAP    "don't wear mask much since losing 106# 2 yr ago" (10/06/2013)  . Type II diabetes mellitus ([place])    "no meds since losing 106# 2 yr ago" (10/06/2013)  . Walking pneumonia 1968    Past Surgical History:  Procedure Laterality Date  . ANKLE FRACTURE SURGERY Left 1973  . ANKLE RECONSTRUCTION Right 05/2011  . CARPAL TUNNEL RELEASE Bilateral 2001  . HARDWARE REMOVAL Right 10/07/2013   Procedure: RIGHT ANKLE HARDWARE REMOVAL;  Surgeon: Newt Minion, MD;  Location: [facility];  Service: Orthopedics;  Laterality: Right;  . I & D EXTREMITY Right 11/18/2013   Procedure: IRRIGATION AND DEBRIDEMENT EXTREMITY;  Surgeon: Newt Minion, MD;  Location: [facility];  Service: Orthopedics;  Laterality: Right;  Irrigation and Debridement Right Fibula , Place Antibiotic Beads and VAC  . NASAL SEPTUM SURGERY  1982  . ORIF ANKLE FRACTURE Right 09/08/2013   Procedure: REPAIR SYNDESMOSIS DISRUPTION RIGHT ANKLE;  Surgeon: Newt Minion, MD;  Location: [facility];  Service: Orthopedics;  Laterality: Right;  . SHOULDER ARTHROSCOPY W/ ROTATOR CUFF REPAIR Right 2006; 2009  . TONSILLECTOMY  1959  . TOTAL ANKLE ARTHROPLASTY Right 08/18/2013   Procedure: TOTAL ANKLE ARTHOPLASTY;  Surgeon: Newt Minion, MD;  Location: [facility];  Service: Orthopedics;  Laterality: Right;  Right Total Ankle Arthroplasty, Revision Fibular Fracture  . URETERAL STENT PLACEMENT  ~ 2010  X 2    Family History  Problem Relation Age of Onset  . Cancer Mother 79       Lung  . Alzheimer's disease Father   . Parkinson's disease Father   . Scoliosis Sister   . Cleft lip Sister     Social History   Socioeconomic History  . Marital status: Divorced    Spouse name: Not on file  . Number of children: 5  . Years of education: Not on file  . Highest education level: Not on file  Occupational History  . Occupation: sales/ DJ  Tobacco Use  . Smoking status: Current Some Day Smoker    Years: 6.00    Types: Pipe, Cigars  . Smokeless tobacco: Never Used  . Tobacco comment: 10/06/2013 "use pipe or cigar 1-2 times/month"  Substance and Sexual Activity  . Alcohol use: Yes    Comment: 10/06/2013 "have 4-5 drinks/yr"  . Drug use: No  . Sexual activity: Yes    Comment: 1 every 2 months  Other Topics Concern  . Not on file  Social History Narrative  . Not on file   Social Determinants of Health   Financial Resource Strain: Not on file  Food Insecurity: Not on file  Transportation Needs: Not on file  Physical Activity: Not on file  Stress: Not on file  Social Connections: Not on file  Intimate Partner Violence: Not on file    Outpatient  Medications Prior to Visit  Medication Sig Dispense Refill  . albuterol (PROVENTIL HFA;VENTOLIN HFA) 108 (90 Base) MCG/ACT inhaler Inhale 2 puffs into the lungs every 6 (six) hours as needed for wheezing or shortness of breath. 1 Inhaler 0  . ALPRAZolam (XANAX) 0.5 MG tablet Take 0.5-1 tablets (0.25-0.5 mg total) by mouth at bedtime as needed for sleep. 20 tablet 3  . AMBULATORY NON FORMULARY MEDICATION Continue CPAP at current settings.  Please provide new supplies. [name] phone number [phone] 1 each 0  . AMBULATORY NON FORMULARY MEDICATION Compression stockings.  Vive size LGM Leg swelling. Disp 1 pair 99 refil. 1 each 99  . atorvastatin (LIPITOR) 40 MG tablet Take 1 tablet by mouth once daily 90 tablet 3  . Azelaic  Acid 15 % cream Apply 1 application topically 2 (two) times daily. 50 g 12  . azithromycin (ZITHROMAX) 250 MG tablet Take 2 tabs on day 1 followed by 1 tab on days 2-5 6 tablet 0  . docusate sodium (COLACE) 50 MG capsule Take 50 mg by mouth daily.    . fluticasone (FLONASE) 50 MCG/ACT nasal spray Place into both nostrils daily.    Marland Kitchen loratadine (CLARITIN) 10 MG tablet Take 10 mg by mouth daily.    Marland Kitchen losartan (COZAAR) 50 MG tablet Take 1 tablet (50 mg total) by mouth daily. 90 tablet 3  . Misc. Devices (FLEX THERAPY) MISC by Does not apply route.    . Multiple Vitamin (MULTIVITAMIN WITH MINERALS) TABS tablet Take 1 tablet by mouth daily.    . Probiotic Product (PROBIOTIC PO) Take by mouth daily.    . tadalafil (CIALIS) 20 MG tablet TAKE HALF TO ONE TABLET BY MOUTH EVERY OTHER DAY AS NEEDED FOR ERECTILE DYSFUNCTION 30 tablet 12  . tamsulosin (FLOMAX) 0.4 MG CAPS capsule Take 1 capsule (0.4 mg total) by mouth daily after breakfast. 90 capsule 3   Facility-Administered Medications Prior to Visit  Medication Dose Route Frequency Provider Last Rate Last Admin  . testosterone cypionate (DEPOTESTOSTERONE CYPIONATE) injection 200 mg  200 mg Intramuscular Q21 days Everrett Coombe, DO   200 mg at 08/03/20 1006    Allergies  Allergen Reactions  . Sesame Oil Hives and Shortness Of Breath  . Clonazepam Hives  . Percocet [Oxycodone-Acetaminophen] Rash  . Topamax [Topiramate]     Cognitive decline  . Vicodin [Hydrocodone-Acetaminophen] Rash    ROS Review of Systems    Objective:    Physical Exam  There were no vitals taken for this visit. Wt Readings from Last 3 Encounters:  08/03/20 263 lb 8 oz (119.5 kg)  08/01/20 264 lb (119.7 kg)  07/05/20 264 lb 14.4 oz (120.2 kg)     Health Maintenance Due  Topic Date Due  . COVID-19 Vaccine (3 - Pfizer risk 4-dose series) 12/08/2019    There are no preventive care reminders to display for this patient.  Lab Results  Component Value Date   TSH  3.21 03/16/2020   Lab Results  Component Value Date   WBC 7.4 07/31/2019   HGB 16.6 07/31/2019   HCT 49.4 07/31/2019   MCV 87.1 07/31/2019   PLT 201 07/31/2019   Lab Results  Component Value Date   NA 137 03/16/2020   K 4.2 03/16/2020   CO2 29 03/16/2020   GLUCOSE 122 (H) 03/16/2020   BUN 12 03/16/2020   CREATININE 1.03 03/16/2020   BILITOT 2.2 (H) 03/16/2020   ALKPHOS 39 (L) 01/07/2017   AST 21 03/16/2020  ALT 30 03/16/2020   PROT 6.9 03/16/2020   ALBUMIN 3.5 (L) 01/07/2017   CALCIUM 9.3 03/16/2020   ANIONGAP 11 07/31/2019   Lab Results  Component Value Date   CHOL 197 03/16/2020   Lab Results  Component Value Date   HDL 35 (L) 03/16/2020   Lab Results  Component Value Date   LDLCALC 126 (H) 03/16/2020   Lab Results  Component Value Date   TRIG 224 (H) 03/16/2020   Lab Results  Component Value Date   CHOLHDL 5.6 (H) 03/16/2020   Lab Results  Component Value Date   HGBA1C 6.0 (H) 03/16/2020      Assessment & Plan:  Hypogonadism - Patient tolerated injection well without complications. Patient advised to schedule next injection 14 days from today.    Problem List Items Addressed This Visit    Hypogonadism in male - Primary      Meds ordered this encounter  Medications  . testosterone cypionate (DEPOTESTOSTERONE CYPIONATE) injection 200 mg    Follow-up: Return in about 2 years (around 08/17/2022) for testosterone injection. Earna Coder, Janalyn Harder, CMA

## 2020-08-29 ENCOUNTER — Other Ambulatory Visit: Payer: Self-pay

## 2020-08-29 MED ORDER — LOSARTAN POTASSIUM 50 MG PO TABS: 50.0000 mg | ORAL_TABLET | Freq: Every day | ORAL | 3 refills | Status: DC

## 2020-08-29 MED ORDER — ATORVASTATIN CALCIUM 40 MG PO TABS
40.0000 mg | ORAL_TABLET | Freq: Every day | ORAL | 3 refills | Status: DC
Start: 2020-08-29 — End: 2020-08-29

## 2020-08-29 MED ORDER — ATORVASTATIN CALCIUM 40 MG PO TABS: 40.0000 mg | ORAL_TABLET | Freq: Every day | ORAL | 3 refills | Status: DC

## 2020-08-29 MED ORDER — TAMSULOSIN HCL 0.4 MG PO CAPS: 0.4000 mg | ORAL_CAPSULE | Freq: Every day | ORAL | 3 refills | Status: DC

## 2020-08-29 MED ORDER — TAMSULOSIN HCL 0.4 MG PO CAPS
0.4000 mg | ORAL_CAPSULE | Freq: Every day | ORAL | 3 refills | Status: DC
Start: 2020-08-29 — End: 2020-08-29

## 2020-08-29 MED ORDER — LOSARTAN POTASSIUM 50 MG PO TABS
50.0000 mg | ORAL_TABLET | Freq: Every day | ORAL | 3 refills | Status: DC
Start: 2020-08-29 — End: 2020-08-29

## 2020-08-31 ENCOUNTER — Ambulatory Visit (INDEPENDENT_AMBULATORY_CARE_PROVIDER_SITE_OTHER): Payer: Medicare HMO | Admitting: Family Medicine

## 2020-08-31 ENCOUNTER — Other Ambulatory Visit: Payer: Self-pay

## 2020-08-31 VITALS — BP 136/78 | HR 77 | Ht 66.14 in | Wt 261.0 lb

## 2020-08-31 DIAGNOSIS — E291 Testicular hypofunction: Secondary | ICD-10-CM

## 2020-08-31 MED ORDER — TESTOSTERONE CYPIONATE 200 MG/ML IM SOLN
200.0000 mg | Freq: Once | INTRAMUSCULAR | Status: AC
  Administered 2020-08-31: 200 mg via INTRAMUSCULAR

## 2020-08-31 NOTE — Progress Notes (Signed)
Agree with documentation as above.   Catherine Metheney, MD  

## 2020-08-31 NOTE — Progress Notes (Signed)
Testosterone given RUOQ without any immediate complications. Pt to return in 2 weeks.

## 2020-09-07 DIAGNOSIS — G933 Postviral fatigue syndrome: Secondary | ICD-10-CM | POA: Diagnosis not present

## 2020-09-07 DIAGNOSIS — E559 Vitamin D deficiency, unspecified: Secondary | ICD-10-CM | POA: Diagnosis not present

## 2020-09-07 DIAGNOSIS — E291 Testicular hypofunction: Secondary | ICD-10-CM | POA: Diagnosis not present

## 2020-09-08 LAB — CBC
HCT: 52.4 % — ABNORMAL HIGH (ref 38.5–50.0)
Hemoglobin: 18.1 g/dL — ABNORMAL HIGH (ref 13.2–17.1)
MCH: 30.8 pg (ref 27.0–33.0)
MCHC: 34.5 g/dL (ref 32.0–36.0)
MCV: 89.3 fL (ref 80.0–100.0)
MPV: 9.8 fL (ref 7.5–12.5)
Platelets: 201 10*3/uL (ref 140–400)
RBC: 5.87 10*6/uL — ABNORMAL HIGH (ref 4.20–5.80)
RDW: 12.4 % (ref 11.0–15.0)
WBC: 7.7 10*3/uL (ref 3.8–10.8)

## 2020-09-08 LAB — ESTRADIOL: Estradiol: 94 pg/mL — ABNORMAL HIGH (ref <=39)

## 2020-09-08 LAB — VITAMIN D 25 HYDROXY (VIT D DEFICIENCY, FRACTURES): Vit D, 25-Hydroxy: 27 ng/mL — ABNORMAL LOW (ref 30–100)

## 2020-09-08 LAB — TESTOSTERONE: Testosterone: 932 ng/dL — ABNORMAL HIGH (ref 250–827)

## 2020-09-14 ENCOUNTER — Encounter: Payer: Self-pay | Admitting: Family Medicine

## 2020-09-14 ENCOUNTER — Ambulatory Visit (INDEPENDENT_AMBULATORY_CARE_PROVIDER_SITE_OTHER): Payer: Medicare HMO

## 2020-09-14 ENCOUNTER — Other Ambulatory Visit: Payer: Self-pay

## 2020-09-14 ENCOUNTER — Ambulatory Visit (INDEPENDENT_AMBULATORY_CARE_PROVIDER_SITE_OTHER): Payer: Medicare HMO | Admitting: Family Medicine

## 2020-09-14 VITALS — BP 160/87 | HR 74 | Temp 98.1°F | Wt 262.3 lb

## 2020-09-14 DIAGNOSIS — M2550 Pain in unspecified joint: Secondary | ICD-10-CM

## 2020-09-14 DIAGNOSIS — M545 Low back pain, unspecified: Secondary | ICD-10-CM

## 2020-09-14 DIAGNOSIS — G8929 Other chronic pain: Secondary | ICD-10-CM

## 2020-09-14 DIAGNOSIS — R251 Tremor, unspecified: Secondary | ICD-10-CM | POA: Diagnosis not present

## 2020-09-14 DIAGNOSIS — M25561 Pain in right knee: Secondary | ICD-10-CM

## 2020-09-14 DIAGNOSIS — M25571 Pain in right ankle and joints of right foot: Secondary | ICD-10-CM | POA: Diagnosis not present

## 2020-09-14 DIAGNOSIS — E291 Testicular hypofunction: Secondary | ICD-10-CM

## 2020-09-14 MED ORDER — ANASTROZOLE 1 MG PO TABS: 1.0000 mg | ORAL_TABLET | Freq: Every day | ORAL | 1 refills | Status: DC

## 2020-09-14 NOTE — Patient Instructions (Signed)
Try anastrozole 1mg  daily.  Space out testosterone to every 3 weeks.  Have xrays completed.  We'll recheck labs in 10-12 weeks.

## 2020-09-14 NOTE — Assessment & Plan Note (Signed)
Xrays of R knee and ankle and lumbar spine ordered.  He may return to orthopedics if symptoms worsen.

## 2020-09-14 NOTE — Assessment & Plan Note (Signed)
Unclear etiology of tremor.  Does not look to be parkinsonian.  Possible intention tremor but also having in lower extremities.  Referral placed to neurology.

## 2020-09-14 NOTE — Assessment & Plan Note (Signed)
Testosterone levels elevated after switching to q2 week dosing, will change back to every 3 weeks.  Estradiol levels elevated.  Discussed addition of arimidex, start 1mg  daily.  Recheck levels in 10-12 weeks.

## 2020-09-14 NOTE — Progress Notes (Signed)
Jonathan Yu - 70 y.o. male MRN [id number]  Date of birth: [birth year]  Subjective Chief Complaint  Patient presents with  . Results    HPI Jonathan Yu is a 70 y.o. male here today for follow up of testosterone.  We had decreased testosterone injections to every 2 weeks due to increased fatigue between shots.  Recent testosterone levels 2 weeks after injection were elevated.  Estradiol levels are also elevated.  Still has continued fatigue.   Remains concerned about tremor.  Worse when trying to do things, less noticeable at rest.  Pain seems to make this worse as well.   Having increased joint pain in R knee and ankle as well as increased L sided back pain.  Pain in back does not radiate.  Denies weakness in lower extremities.  No joint swelling noted.    ROS:  A comprehensive ROS was completed and negative except as noted per HPI  Allergies  Allergen Reactions  . Sesame Oil Hives and Shortness Of Breath  . Clonazepam Hives  . Percocet [Oxycodone-Acetaminophen] Rash  . Topamax [Topiramate]     Cognitive decline  . Vicodin [Hydrocodone-Acetaminophen] Rash    Past Medical History:  Diagnosis Date  . Allergy   . DDD (degenerative disc disease), lumbar   . Hyperlipidemia    "no meds since losing 106# 2 yr ago" (10/06/2013)  . Hypertension 2012   "no meds since losing 106# 2 yr ago" (10/06/2013)  . OSA on CPAP    "don't wear mask much since losing 106# 2 yr ago" (10/06/2013)  . Type II diabetes mellitus ([place])    "no meds since losing 106# 2 yr ago" (10/06/2013)  . Walking pneumonia 1968    Past Surgical History:  Procedure Laterality Date  . ANKLE FRACTURE SURGERY Left 1973  . ANKLE RECONSTRUCTION Right 05/2011  . CARPAL TUNNEL RELEASE Bilateral 2001  . HARDWARE REMOVAL Right 10/07/2013   Procedure: RIGHT ANKLE HARDWARE REMOVAL;  Surgeon: Newt Minion, MD;  Location: [facility];  Service: Orthopedics;  Laterality: Right;  . I & D EXTREMITY Right 11/18/2013   Procedure:  IRRIGATION AND DEBRIDEMENT EXTREMITY;  Surgeon: Newt Minion, MD;  Location: [facility];  Service: Orthopedics;  Laterality: Right;  Irrigation and Debridement Right Fibula , Place Antibiotic Beads and VAC  . NASAL SEPTUM SURGERY  1982  . ORIF ANKLE FRACTURE Right 09/08/2013   Procedure: REPAIR SYNDESMOSIS DISRUPTION RIGHT ANKLE;  Surgeon: Newt Minion, MD;  Location: [facility];  Service: Orthopedics;  Laterality: Right;  . SHOULDER ARTHROSCOPY W/ ROTATOR CUFF REPAIR Right 2006; 2009  . TONSILLECTOMY  1959  . TOTAL ANKLE ARTHROPLASTY Right 08/18/2013   Procedure: TOTAL ANKLE ARTHOPLASTY;  Surgeon: Newt Minion, MD;  Location: [facility];  Service: Orthopedics;  Laterality: Right;  Right Total Ankle Arthroplasty, Revision Fibular Fracture  . URETERAL STENT PLACEMENT  ~ 2010 X 2    Social History   Socioeconomic History  . Marital status: Divorced    Spouse name: Not on file  . Number of children: 5  . Years of education: Not on file  . Highest education level: Not on file  Occupational History  . Occupation: sales/ DJ  Tobacco Use  . Smoking status: Current Some Day Smoker    Years: 6.00    Types: Pipe, Cigars  . Smokeless tobacco: Never Used  . Tobacco comment: 10/06/2013 "use pipe or cigar 1-2 times/month"  Substance and Sexual Activity  . Alcohol use: Yes    Comment:  10/06/2013 "have 4-5 drinks/yr"  . Drug use: No  . Sexual activity: Yes    Comment: 1 every 2 months  Other Topics Concern  . Not on file  Social History Narrative  . Not on file   Social Determinants of Health   Financial Resource Strain: Not on file  Food Insecurity: Not on file  Transportation Needs: Not on file  Physical Activity: Not on file  Stress: Not on file  Social Connections: Not on file    Family History  Problem Relation Age of Onset  . Cancer Mother 26       Lung  . Alzheimer's disease Father   . Parkinson's disease Father   . Scoliosis Sister   . Cleft lip Sister     Health Maintenance  Topic  Date Due  . COVID-19 Vaccine (3 - Pfizer risk 4-dose series) 12/08/2019  . TETANUS/TDAP  04/24/2021  . COLONOSCOPY (Pts 45-65yrs Insurance coverage will need to be confirmed)  06/03/2029  . INFLUENZA VACCINE  Completed  . Hepatitis C Screening  Completed  . PNA vac Low Risk Adult  Completed     ----------------------------------------------------------------------------------------------------------------------------------------------------------------------------------------------------------------- Physical Exam BP (!) 160/87 (BP Location: Left Arm, Patient Position: Sitting, Cuff Size: Large)   Pulse 74   Temp 98.1 F (36.7 C)   Wt 262 lb 4.8 oz (119 kg)   SpO2 96%   BMI 42.16 kg/m   Physical Exam Constitutional:      Appearance: Normal appearance.  HENT:     Head: Normocephalic and atraumatic.  Eyes:     General: No scleral icterus. Cardiovascular:     Rate and Rhythm: Normal rate and regular rhythm.  Pulmonary:     Effort: Pulmonary effort is normal.     Breath sounds: Normal breath sounds.  Neurological:     General: No focal deficit present.     Mental Status: He is alert.  Psychiatric:        Mood and Affect: Mood normal.     ------------------------------------------------------------------------------------------------------------------------------------------------------------------------------------------------------------------- Assessment and Plan  Hypogonadism in male Testosterone levels elevated after switching to q2 week dosing, will change back to every 3 weeks.  Estradiol levels elevated.  Discussed addition of arimidex, start 1mg  daily.  Recheck levels in 10-12 weeks.   Tremor Unclear etiology of tremor.  Does not look to be parkinsonian.  Possible intention tremor but also having in lower extremities.  Referral placed to neurology.   Joint pain Xrays of R knee and ankle and lumbar spine ordered.  He may return to orthopedics if symptoms  worsen.     Meds ordered this encounter  Medications  . anastrozole (ARIMIDEX) 1 MG tablet    Sig: Take 1 tablet (1 mg total) by mouth daily.    Dispense:  30 tablet    Refill:  1    No follow-ups on file.    This visit occurred during the SARS-CoV-2 public health emergency.  Safety protocols were in place, including screening questions prior to the visit, additional usage of staff PPE, and extensive cleaning of exam room while observing appropriate contact time as indicated for disinfecting solutions.

## 2020-09-20 ENCOUNTER — Encounter: Payer: Self-pay | Admitting: Neurology

## 2020-09-20 ENCOUNTER — Ambulatory Visit: Payer: Self-pay | Admitting: Neurology

## 2020-09-20 ENCOUNTER — Telehealth: Payer: Self-pay | Admitting: *Deleted

## 2020-09-20 NOTE — Telephone Encounter (Signed)
No showed new patient appointment. 

## 2020-09-20 NOTE — Telephone Encounter (Addendum)
The patient arrived 15 minutes late to his appt and was rescheduled by the front desk. [place] location [place].

## 2020-09-21 ENCOUNTER — Ambulatory Visit (INDEPENDENT_AMBULATORY_CARE_PROVIDER_SITE_OTHER): Payer: Medicare HMO | Admitting: Family Medicine

## 2020-09-21 VITALS — BP 146/75 | HR 66

## 2020-09-21 DIAGNOSIS — E291 Testicular hypofunction: Secondary | ICD-10-CM

## 2020-09-21 MED ORDER — TESTOSTERONE CYPIONATE 200 MG/ML IM SOLN
200.0000 mg | Freq: Once | INTRAMUSCULAR | Status: AC
  Administered 2020-09-21: 200 mg via INTRAMUSCULAR

## 2020-09-21 NOTE — Progress Notes (Signed)
Established Patient Office Visit  Subjective:  Patient ID: Jonathan Yu, male    DOB: 04/15/1951  Age: 70 y.o. MRN: [id number]  CC:  Chief Complaint  Patient presents with  . Hypogonadism    HPI Jonathan Yu is here for a testosterone injection. Denies chest pain, shortness of breath, headaches or mood changes.   Past Medical History:  Diagnosis Date  . Allergy   . DDD (degenerative disc disease), lumbar   . Hyperlipidemia    "no meds since losing 106# 2 yr ago" (10/06/2013)  . Hypertension 2012   "no meds since losing 106# 2 yr ago" (10/06/2013)  . OSA on CPAP    "don't wear mask much since losing 106# 2 yr ago" (10/06/2013)  . Type II diabetes mellitus ([place])    "no meds since losing 106# 2 yr ago" (10/06/2013)  . Walking pneumonia 1968    Past Surgical History:  Procedure Laterality Date  . ANKLE FRACTURE SURGERY Left 1973  . ANKLE RECONSTRUCTION Right 05/2011  . CARPAL TUNNEL RELEASE Bilateral 2001  . HARDWARE REMOVAL Right 10/07/2013   Procedure: RIGHT ANKLE HARDWARE REMOVAL;  Surgeon: Newt Minion, MD;  Location: [facility];  Service: Orthopedics;  Laterality: Right;  . I & D EXTREMITY Right 11/18/2013   Procedure: IRRIGATION AND DEBRIDEMENT EXTREMITY;  Surgeon: Newt Minion, MD;  Location: [facility];  Service: Orthopedics;  Laterality: Right;  Irrigation and Debridement Right Fibula , Place Antibiotic Beads and VAC  . NASAL SEPTUM SURGERY  1982  . ORIF ANKLE FRACTURE Right 09/08/2013   Procedure: REPAIR SYNDESMOSIS DISRUPTION RIGHT ANKLE;  Surgeon: Newt Minion, MD;  Location: [facility];  Service: Orthopedics;  Laterality: Right;  . SHOULDER ARTHROSCOPY W/ ROTATOR CUFF REPAIR Right 2006; 2009  . TONSILLECTOMY  1959  . TOTAL ANKLE ARTHROPLASTY Right 08/18/2013   Procedure: TOTAL ANKLE ARTHOPLASTY;  Surgeon: Newt Minion, MD;  Location: [facility];  Service: Orthopedics;  Laterality: Right;  Right Total Ankle Arthroplasty, Revision Fibular Fracture  . URETERAL STENT PLACEMENT  ~ 2010  X 2    Family History  Problem Relation Age of Onset  . Cancer Mother 81       Lung  . Alzheimer's disease Father   . Parkinson's disease Father   . Scoliosis Sister   . Cleft lip Sister     Social History   Socioeconomic History  . Marital status: Divorced    Spouse name: Not on file  . Number of children: 5  . Years of education: Not on file  . Highest education level: Not on file  Occupational History  . Occupation: sales/ DJ  Tobacco Use  . Smoking status: Current Some Day Smoker    Years: 6.00    Types: Pipe, Cigars  . Smokeless tobacco: Never Used  . Tobacco comment: 10/06/2013 "use pipe or cigar 1-2 times/month"  Substance and Sexual Activity  . Alcohol use: Yes    Comment: 10/06/2013 "have 4-5 drinks/yr"  . Drug use: No  . Sexual activity: Yes    Comment: 1 every 2 months  Other Topics Concern  . Not on file  Social History Narrative  . Not on file   Social Determinants of Health   Financial Resource Strain: Not on file  Food Insecurity: Not on file  Transportation Needs: Not on file  Physical Activity: Not on file  Stress: Not on file  Social Connections: Not on file  Intimate Partner Violence: Not on file    Outpatient  Medications Prior to Visit  Medication Sig Dispense Refill  . albuterol (PROVENTIL HFA;VENTOLIN HFA) 108 (90 Base) MCG/ACT inhaler Inhale 2 puffs into the lungs every 6 (six) hours as needed for wheezing or shortness of breath. 1 Inhaler 0  . ALPRAZolam (XANAX) 0.5 MG tablet Take 0.5-1 tablets (0.25-0.5 mg total) by mouth at bedtime as needed for sleep. 20 tablet 3  . AMBULATORY NON FORMULARY MEDICATION Continue CPAP at current settings.  Please provide new supplies. Micheal Likens phone number [phone] 1 each 0  . AMBULATORY NON FORMULARY MEDICATION Compression stockings.  Vive size LGM Leg swelling. Disp 1 pair 99 refil. 1 each 99  . anastrozole (ARIMIDEX) 1 MG tablet Take 1 tablet (1 mg total) by mouth daily. 30 tablet 1  .  atorvastatin (LIPITOR) 40 MG tablet Take 1 tablet (40 mg total) by mouth daily. 90 tablet 3  . Azelaic Acid 15 % cream Apply 1 application topically 2 (two) times daily. 50 g 12  . docusate sodium (COLACE) 50 MG capsule Take 50 mg by mouth daily.    . fluticasone (FLONASE) 50 MCG/ACT nasal spray Place into both nostrils daily.    Marland Kitchen loratadine (CLARITIN) 10 MG tablet Take 10 mg by mouth daily.    Marland Kitchen losartan (COZAAR) 50 MG tablet Take 1 tablet (50 mg total) by mouth daily. 90 tablet 3  . Misc. Devices (FLEX THERAPY) MISC by Does not apply route.    . Multiple Vitamin (MULTIVITAMIN WITH MINERALS) TABS tablet Take 1 tablet by mouth daily.    . Probiotic Product (PROBIOTIC PO) Take by mouth daily.    . tadalafil (CIALIS) 20 MG tablet TAKE HALF TO ONE TABLET BY MOUTH EVERY OTHER DAY AS NEEDED FOR ERECTILE DYSFUNCTION 30 tablet 12  . tamsulosin (FLOMAX) 0.4 MG CAPS capsule Take 1 capsule (0.4 mg total) by mouth daily after breakfast. 90 capsule 3   Facility-Administered Medications Prior to Visit  Medication Dose Route Frequency Provider Last Rate Last Admin  . testosterone cypionate (DEPOTESTOSTERONE CYPIONATE) injection 200 mg  200 mg Intramuscular Q21 days Luetta Nutting, DO   200 mg at 08/03/20 1006    Allergies  Allergen Reactions  . Sesame Oil Hives and Shortness Of Breath  . Clonazepam Hives  . Percocet [Oxycodone-Acetaminophen] Rash  . Topamax [Topiramate]     Cognitive decline  . Vicodin [Hydrocodone-Acetaminophen] Rash    ROS Review of Systems    Objective:    Physical Exam  BP (!) 154/74   Pulse 66   SpO2 97%  Wt Readings from Last 3 Encounters:  09/14/20 262 lb 4.8 oz (119 kg)  08/31/20 261 lb (118.4 kg)  08/03/20 263 lb 8 oz (119.5 kg)     Health Maintenance Due  Topic Date Due  . COVID-19 Vaccine (3 - Pfizer risk 4-dose series) 12/08/2019    There are no preventive care reminders to display for this patient.  Lab Results  Component Value Date   TSH 3.21  03/16/2020   Lab Results  Component Value Date   WBC 7.7 09/07/2020   HGB 18.1 (H) 09/07/2020   HCT 52.4 (H) 09/07/2020   MCV 89.3 09/07/2020   PLT 201 09/07/2020   Lab Results  Component Value Date   NA 137 03/16/2020   K 4.2 03/16/2020   CO2 29 03/16/2020   GLUCOSE 122 (H) 03/16/2020   BUN 12 03/16/2020   CREATININE 1.03 03/16/2020   BILITOT 2.2 (H) 03/16/2020   ALKPHOS 39 (L) 01/07/2017   AST  21 03/16/2020   ALT 30 03/16/2020   PROT 6.9 03/16/2020   ALBUMIN 3.5 (L) 01/07/2017   CALCIUM 9.3 03/16/2020   ANIONGAP 11 07/31/2019   Lab Results  Component Value Date   CHOL 197 03/16/2020   Lab Results  Component Value Date   HDL 35 (L) 03/16/2020   Lab Results  Component Value Date   LDLCALC 126 (H) 03/16/2020   Lab Results  Component Value Date   TRIG 224 (H) 03/16/2020   Lab Results  Component Value Date   CHOLHDL 5.6 (H) 03/16/2020   Lab Results  Component Value Date   HGBA1C 6.0 (H) 03/16/2020      Assessment & Plan:  Hypogonadism - Patient tolerated injection well without complications. Patient advised to schedule next injection 21 days from today.   Hypertension - blood pressure was slightly elevated today. Patient advised to continue current medications and check home blood pressures a few days a week. Follow up with blood pressure readings in 3 weeks.   Problem List Items Addressed This Visit    Hypogonadism in male - Primary      Meds ordered this encounter  Medications  . testosterone cypionate (DEPOTESTOSTERONE CYPIONATE) injection 200 mg    Follow-up: Return in about 3 weeks (around 10/12/2020) for testosterone injection. Durene Romans, Monico Blitz, [place]

## 2020-09-21 NOTE — Progress Notes (Signed)
Medical screening examination/treatment was performed by qualified clinical staff member and as supervising physician I was immediately available for consultation/collaboration. I have reviewed documentation and agree with assessment and plan.  Purity Irmen, DO  

## 2020-10-12 ENCOUNTER — Other Ambulatory Visit: Payer: Self-pay

## 2020-10-12 ENCOUNTER — Ambulatory Visit (INDEPENDENT_AMBULATORY_CARE_PROVIDER_SITE_OTHER): Payer: Medicare HMO | Admitting: Family Medicine

## 2020-10-12 VITALS — BP 140/75 | HR 96 | Temp 98.1°F | Resp 20 | Ht 66.14 in | Wt 262.0 lb

## 2020-10-12 DIAGNOSIS — H52223 Regular astigmatism, bilateral: Secondary | ICD-10-CM | POA: Diagnosis not present

## 2020-10-12 DIAGNOSIS — E291 Testicular hypofunction: Secondary | ICD-10-CM | POA: Diagnosis not present

## 2020-10-12 DIAGNOSIS — H25813 Combined forms of age-related cataract, bilateral: Secondary | ICD-10-CM | POA: Diagnosis not present

## 2020-10-12 DIAGNOSIS — H16203 Unspecified keratoconjunctivitis, bilateral: Secondary | ICD-10-CM | POA: Diagnosis not present

## 2020-10-12 DIAGNOSIS — H5213 Myopia, bilateral: Secondary | ICD-10-CM | POA: Diagnosis not present

## 2020-10-12 MED ORDER — TESTOSTERONE CYPIONATE 200 MG/ML IM SOLN
200.0000 mg | Freq: Once | INTRAMUSCULAR | Status: AC
  Administered 2020-10-12: 200 mg via INTRAMUSCULAR

## 2020-10-12 NOTE — Progress Notes (Signed)
Medical screening examination/treatment was performed by qualified clinical staff member and as supervising physician I was immediately available for consultation/collaboration. I have reviewed documentation and agree with assessment and plan.  Everleigh Colclasure, DO  

## 2020-10-12 NOTE — Progress Notes (Signed)
Established Patient Office Visit  Subjective:  Patient ID: Jonathan Yu, male    DOB: 1951-08-11  Age: 70 y.o. MRN: [id number]  CC:  Chief Complaint  Patient presents with  . Hypogonadism    HPI Jonathan Yu presents for a testosterone injection.  Past Medical History:  Diagnosis Date  . Allergy   . DDD (degenerative disc disease), lumbar   . Hyperlipidemia    "no meds since losing 106# 2 yr ago" (10/06/2013)  . Hypertension 2012   "no meds since losing 106# 2 yr ago" (10/06/2013)  . OSA on CPAP    "don't wear mask much since losing 106# 2 yr ago" (10/06/2013)  . Type II diabetes mellitus ([place])    "no meds since losing 106# 2 yr ago" (10/06/2013)  . Walking pneumonia 1968    Past Surgical History:  Procedure Laterality Date  . ANKLE FRACTURE SURGERY Left 1973  . ANKLE RECONSTRUCTION Right 05/2011  . CARPAL TUNNEL RELEASE Bilateral 2001  . HARDWARE REMOVAL Right 10/07/2013   Procedure: RIGHT ANKLE HARDWARE REMOVAL;  Surgeon: Newt Minion, MD;  Location: [facility];  Service: Orthopedics;  Laterality: Right;  . I & D EXTREMITY Right 11/18/2013   Procedure: IRRIGATION AND DEBRIDEMENT EXTREMITY;  Surgeon: Newt Minion, MD;  Location: [place];  Service: Orthopedics;  Laterality: Right;  Irrigation and Debridement Right Fibula , Place Antibiotic Beads and VAC  . NASAL SEPTUM SURGERY  1982  . ORIF ANKLE FRACTURE Right 09/08/2013   Procedure: REPAIR SYNDESMOSIS DISRUPTION RIGHT ANKLE;  Surgeon: Newt Minion, MD;  Location: [facility];  Service: Orthopedics;  Laterality: Right;  . SHOULDER ARTHROSCOPY W/ ROTATOR CUFF REPAIR Right 2006; 2009  . TONSILLECTOMY  1959  . TOTAL ANKLE ARTHROPLASTY Right 08/18/2013   Procedure: TOTAL ANKLE ARTHOPLASTY;  Surgeon: Newt Minion, MD;  Location: [facility];  Service: Orthopedics;  Laterality: Right;  Right Total Ankle Arthroplasty, Revision Fibular Fracture  . URETERAL STENT PLACEMENT  ~ 2010 X 2    Family History  Problem Relation Age of Onset  . Cancer  Mother 29       Lung  . Alzheimer's disease Father   . Parkinson's disease Father   . Scoliosis Sister   . Cleft lip Sister     Social History   Socioeconomic History  . Marital status: Divorced    Spouse name: Not on file  . Number of children: 5  . Years of education: Not on file  . Highest education level: Not on file  Occupational History  . Occupation: sales/ DJ  Tobacco Use  . Smoking status: Current Some Day Smoker    Years: 6.00    Types: Pipe, Cigars  . Smokeless tobacco: Never Used  . Tobacco comment: 10/06/2013 "use pipe or cigar 1-2 times/month"  Substance and Sexual Activity  . Alcohol use: Yes    Comment: 10/06/2013 "have 4-5 drinks/yr"  . Drug use: No  . Sexual activity: Yes    Comment: 1 every 2 months  Other Topics Concern  . Not on file  Social History Narrative  . Not on file   Social Determinants of Health   Financial Resource Strain: Not on file  Food Insecurity: Not on file  Transportation Needs: Not on file  Physical Activity: Not on file  Stress: Not on file  Social Connections: Not on file  Intimate Partner Violence: Not on file    Outpatient Medications Prior to Visit  Medication Sig Dispense Refill  . albuterol (  PROVENTIL HFA;VENTOLIN HFA) 108 (90 Base) MCG/ACT inhaler Inhale 2 puffs into the lungs every 6 (six) hours as needed for wheezing or shortness of breath. 1 Inhaler 0  . ALPRAZolam (XANAX) 0.5 MG tablet Take 0.5-1 tablets (0.25-0.5 mg total) by mouth at bedtime as needed for sleep. 20 tablet 3  . AMBULATORY NON FORMULARY MEDICATION Continue CPAP at current settings.  Please provide new supplies. Micheal Likens phone number [phone] 1 each 0  . AMBULATORY NON FORMULARY MEDICATION Compression stockings.  Vive size LGM Leg swelling. Disp 1 pair 99 refil. 1 each 99  . anastrozole (ARIMIDEX) 1 MG tablet Take 1 tablet (1 mg total) by mouth daily. 30 tablet 1  . atorvastatin (LIPITOR) 40 MG tablet Take 1 tablet (40 mg total) by  mouth daily. 90 tablet 3  . Azelaic Acid 15 % cream Apply 1 application topically 2 (two) times daily. 50 g 12  . docusate sodium (COLACE) 50 MG capsule Take 50 mg by mouth daily.    . fluticasone (FLONASE) 50 MCG/ACT nasal spray Place into both nostrils daily.    Marland Kitchen loratadine (CLARITIN) 10 MG tablet Take 10 mg by mouth daily.    Marland Kitchen losartan (COZAAR) 50 MG tablet Take 1 tablet (50 mg total) by mouth daily. 90 tablet 3  . Misc. Devices (FLEX THERAPY) MISC by Does not apply route.    . Multiple Vitamin (MULTIVITAMIN WITH MINERALS) TABS tablet Take 1 tablet by mouth daily.    . Probiotic Product (PROBIOTIC PO) Take by mouth daily.    . tadalafil (CIALIS) 20 MG tablet TAKE HALF TO ONE TABLET BY MOUTH EVERY OTHER DAY AS NEEDED FOR ERECTILE DYSFUNCTION 30 tablet 12  . tamsulosin (FLOMAX) 0.4 MG CAPS capsule Take 1 capsule (0.4 mg total) by mouth daily after breakfast. 90 capsule 3   Facility-Administered Medications Prior to Visit  Medication Dose Route Frequency Provider Last Rate Last Admin  . testosterone cypionate (DEPOTESTOSTERONE CYPIONATE) injection 200 mg  200 mg Intramuscular Q21 days Luetta Nutting, DO   200 mg at 08/03/20 1006    Allergies  Allergen Reactions  . Sesame Oil Hives and Shortness Of Breath  . Clonazepam Hives  . Percocet [Oxycodone-Acetaminophen] Rash  . Topamax [Topiramate]     Cognitive decline  . Vicodin [Hydrocodone-Acetaminophen] Rash    ROS Review of Systems    Objective:    Physical Exam  BP 140/75 (BP Location: Left Arm, Patient Position: Sitting, Cuff Size: Large)   Pulse 96   Temp 98.1 F (36.7 C) (Temporal)   Resp 20   Ht 5' 6.14" (1.68 m)   Wt 262 lb (118.8 kg)   SpO2 96%   BMI 42.11 kg/m  Wt Readings from Last 3 Encounters:  10/12/20 262 lb (118.8 kg)  09/14/20 262 lb 4.8 oz (119 kg)  08/31/20 261 lb (118.4 kg)     Health Maintenance Due  Topic Date Due  . COVID-19 Vaccine (3 - Pfizer risk 4-dose series) 12/08/2019    There are no  preventive care reminders to display for this patient.  Lab Results  Component Value Date   TSH 3.21 03/16/2020   Lab Results  Component Value Date   WBC 7.7 09/07/2020   HGB 18.1 (H) 09/07/2020   HCT 52.4 (H) 09/07/2020   MCV 89.3 09/07/2020   PLT 201 09/07/2020   Lab Results  Component Value Date   NA 137 03/16/2020   K 4.2 03/16/2020   CO2 29 03/16/2020   GLUCOSE 122 (H)  03/16/2020   BUN 12 03/16/2020   CREATININE 1.03 03/16/2020   BILITOT 2.2 (H) 03/16/2020   ALKPHOS 39 (L) 01/07/2017   AST 21 03/16/2020   ALT 30 03/16/2020   PROT 6.9 03/16/2020   ALBUMIN 3.5 (L) 01/07/2017   CALCIUM 9.3 03/16/2020   ANIONGAP 11 07/31/2019   Lab Results  Component Value Date   CHOL 197 03/16/2020   Lab Results  Component Value Date   HDL 35 (L) 03/16/2020   Lab Results  Component Value Date   LDLCALC 126 (H) 03/16/2020   Lab Results  Component Value Date   TRIG 224 (H) 03/16/2020   Lab Results  Component Value Date   CHOLHDL 5.6 (H) 03/16/2020   Lab Results  Component Value Date   HGBA1C 6.0 (H) 03/16/2020      Assessment & Plan:  Pt tolerated testosterone injection with no complications. Pt will return in 3 weeks.  Problem List Items Addressed This Visit      Endocrine   Hypogonadism in male - Primary      Meds ordered this encounter  Medications  . testosterone cypionate (DEPOTESTOSTERONE CYPIONATE) injection 200 mg    Follow-up: Return in about 3 weeks (around 11/02/2020).    Ninfa Meeker, CMA

## 2020-11-01 ENCOUNTER — Ambulatory Visit (INDEPENDENT_AMBULATORY_CARE_PROVIDER_SITE_OTHER): Payer: Medicare HMO | Admitting: Family Medicine

## 2020-11-01 ENCOUNTER — Other Ambulatory Visit: Payer: Self-pay

## 2020-11-01 VITALS — BP 135/80 | HR 70 | Resp 20 | Ht 66.0 in | Wt 262.0 lb

## 2020-11-01 DIAGNOSIS — E291 Testicular hypofunction: Secondary | ICD-10-CM

## 2020-11-01 DIAGNOSIS — H52223 Regular astigmatism, bilateral: Secondary | ICD-10-CM | POA: Diagnosis not present

## 2020-11-01 MED ORDER — TESTOSTERONE CYPIONATE 200 MG/ML IM SOLN
200.0000 mg | Freq: Once | INTRAMUSCULAR | Status: AC
Start: 2020-11-01 — End: 2020-11-01
  Administered 2020-11-01: 200 mg via INTRAMUSCULAR

## 2020-11-01 NOTE — Progress Notes (Signed)
Medical screening examination/treatment was performed by qualified clinical staff member and as supervising physician I was immediately available for consultation/collaboration. I have reviewed documentation and agree with assessment and plan.  Urvi Imes, DO  

## 2020-11-01 NOTE — Progress Notes (Signed)
Established Patient Office Visit  Subjective:  Patient ID: Jonathan Yu, male    DOB: Nov 08, 1950  Age: 70 y.o. MRN: [id number]  CC:  Chief Complaint  Patient presents with  . Hypogonadism    HPI Jonathan Yu presents for testosterone injection. Denies chest pain, shortness of breath, headaches, and problems with medication or mood changes.    Patient tolerated testosterone 200 mg injection to LUOQ well without complications. Patient advised to schedule next injection in 14 days.   Past Medical History:  Diagnosis Date  . Allergy   . DDD (degenerative disc disease), lumbar   . Hyperlipidemia    "no meds since losing 106# 2 yr ago" (10/06/2013)  . Hypertension 2012   "no meds since losing 106# 2 yr ago" (10/06/2013)  . OSA on CPAP    "don't wear mask much since losing 106# 2 yr ago" (10/06/2013)  . Type II diabetes mellitus ([place])    "no meds since losing 106# 2 yr ago" (10/06/2013)  . Walking pneumonia 1968    Past Surgical History:  Procedure Laterality Date  . ANKLE FRACTURE SURGERY Left 1973  . ANKLE RECONSTRUCTION Right 05/2011  . CARPAL TUNNEL RELEASE Bilateral 2001  . HARDWARE REMOVAL Right 10/07/2013   Procedure: RIGHT ANKLE HARDWARE REMOVAL;  Surgeon: Newt Minion, MD;  Location: [facility];  Service: Orthopedics;  Laterality: Right;  . I & D EXTREMITY Right 11/18/2013   Procedure: IRRIGATION AND DEBRIDEMENT EXTREMITY;  Surgeon: Newt Minion, MD;  Location: [facility] [facility];  Service: Orthopedics;  Laterality: Right;  Irrigation and Debridement Right Fibula , Place Antibiotic Beads and VAC  . NASAL SEPTUM SURGERY  1982  . ORIF ANKLE FRACTURE Right 09/08/2013   Procedure: REPAIR SYNDESMOSIS DISRUPTION RIGHT ANKLE;  Surgeon: Newt Minion, MD;  Location: [place];  Service: Orthopedics;  Laterality: Right;  . SHOULDER ARTHROSCOPY W/ ROTATOR CUFF REPAIR Right 2006; 2009  . TONSILLECTOMY  1959  . TOTAL ANKLE ARTHROPLASTY Right 08/18/2013   Procedure: TOTAL ANKLE ARTHOPLASTY;  Surgeon:  Newt Minion, MD;  Location: [facility];  Service: Orthopedics;  Laterality: Right;  Right Total Ankle Arthroplasty, Revision Fibular Fracture  . URETERAL STENT PLACEMENT  ~ 2010 X 2    Family History  Problem Relation Age of Onset  . Cancer Mother 73       Lung  . Alzheimer's disease Father   . Parkinson's disease Father   . Scoliosis Sister   . Cleft lip Sister     Social History   Socioeconomic History  . Marital status: Divorced    Spouse name: Not on file  . Number of children: 5  . Years of education: Not on file  . Highest education level: Not on file  Occupational History  . Occupation: sales/ DJ  Tobacco Use  . Smoking status: Current Some Day Smoker    Years: 6.00    Types: Pipe, Cigars  . Smokeless tobacco: Never Used  . Tobacco comment: 10/06/2013 "use pipe or cigar 1-2 times/month"  Substance and Sexual Activity  . Alcohol use: Yes    Comment: 10/06/2013 "have 4-5 drinks/yr"  . Drug use: No  . Sexual activity: Yes    Comment: 1 every 2 months  Other Topics Concern  . Not on file  Social History Narrative  . Not on file   Social Determinants of Health   Financial Resource Strain: Not on file  Food Insecurity: Not on file  Transportation Needs: Not on file  Physical Activity:  Not on file  Stress: Not on file  Social Connections: Not on file  Intimate Partner Violence: Not on file    Outpatient Medications Prior to Visit  Medication Sig Dispense Refill  . albuterol (PROVENTIL HFA;VENTOLIN HFA) 108 (90 Base) MCG/ACT inhaler Inhale 2 puffs into the lungs every 6 (six) hours as needed for wheezing or shortness of breath. 1 Inhaler 0  . ALPRAZolam (XANAX) 0.5 MG tablet Take 0.5-1 tablets (0.25-0.5 mg total) by mouth at bedtime as needed for sleep. 20 tablet 3  . AMBULATORY NON FORMULARY MEDICATION Continue CPAP at current settings.  Please provide new supplies. Micheal Likens phone number [phone] 1 each 0  . AMBULATORY NON FORMULARY MEDICATION  Compression stockings.  Vive size LGM Leg swelling. Disp 1 pair 99 refil. 1 each 99  . anastrozole (ARIMIDEX) 1 MG tablet Take 1 tablet (1 mg total) by mouth daily. 30 tablet 1  . atorvastatin (LIPITOR) 40 MG tablet Take 1 tablet (40 mg total) by mouth daily. 90 tablet 3  . Azelaic Acid 15 % cream Apply 1 application topically 2 (two) times daily. 50 g 12  . docusate sodium (COLACE) 50 MG capsule Take 50 mg by mouth daily.    . fluticasone (FLONASE) 50 MCG/ACT nasal spray Place into both nostrils daily.    Marland Kitchen loratadine (CLARITIN) 10 MG tablet Take 10 mg by mouth daily.    Marland Kitchen losartan (COZAAR) 50 MG tablet Take 1 tablet (50 mg total) by mouth daily. 90 tablet 3  . Misc. Devices (FLEX THERAPY) MISC by Does not apply route.    . Multiple Vitamin (MULTIVITAMIN WITH MINERALS) TABS tablet Take 1 tablet by mouth daily.    . Probiotic Product (PROBIOTIC PO) Take by mouth daily.    . tadalafil (CIALIS) 20 MG tablet TAKE HALF TO ONE TABLET BY MOUTH EVERY OTHER DAY AS NEEDED FOR ERECTILE DYSFUNCTION 30 tablet 12  . tamsulosin (FLOMAX) 0.4 MG CAPS capsule Take 1 capsule (0.4 mg total) by mouth daily after breakfast. 90 capsule 3   Facility-Administered Medications Prior to Visit  Medication Dose Route Frequency Provider Last Rate Last Admin  . testosterone cypionate (DEPOTESTOSTERONE CYPIONATE) injection 200 mg  200 mg Intramuscular Q21 days Luetta Nutting, DO   200 mg at 08/03/20 1006    Allergies  Allergen Reactions  . Sesame Oil Hives and Shortness Of Breath  . Clonazepam Hives  . Percocet [Oxycodone-Acetaminophen] Rash  . Topamax [Topiramate]     Cognitive decline  . Vicodin [Hydrocodone-Acetaminophen] Rash    ROS Review of Systems    Objective:    Physical Exam  BP 135/80 (BP Location: Left Arm, Patient Position: Sitting, Cuff Size: Normal)   Pulse 70   Resp 20   Ht 5\' 6"  (1.676 m)   Wt 262 lb (118.8 kg)   SpO2 99%   BMI 42.29 kg/m  Wt Readings from Last 3 Encounters:   11/01/20 262 lb (118.8 kg)  10/12/20 262 lb (118.8 kg)  09/14/20 262 lb 4.8 oz (119 kg)     Health Maintenance Due  Topic Date Due  . COVID-19 Vaccine (3 - Pfizer risk 4-dose series) 12/08/2019    There are no preventive care reminders to display for this patient.  Lab Results  Component Value Date   TSH 3.21 03/16/2020   Lab Results  Component Value Date   WBC 7.7 09/07/2020   HGB 18.1 (H) 09/07/2020   HCT 52.4 (H) 09/07/2020   MCV 89.3 09/07/2020   PLT 201  09/07/2020   Lab Results  Component Value Date   NA 137 03/16/2020   K 4.2 03/16/2020   CO2 29 03/16/2020   GLUCOSE 122 (H) 03/16/2020   BUN 12 03/16/2020   CREATININE 1.03 03/16/2020   BILITOT 2.2 (H) 03/16/2020   ALKPHOS 39 (L) 01/07/2017   AST 21 03/16/2020   ALT 30 03/16/2020   PROT 6.9 03/16/2020   ALBUMIN 3.5 (L) 01/07/2017   CALCIUM 9.3 03/16/2020   ANIONGAP 11 07/31/2019   Lab Results  Component Value Date   CHOL 197 03/16/2020   Lab Results  Component Value Date   HDL 35 (L) 03/16/2020   Lab Results  Component Value Date   LDLCALC 126 (H) 03/16/2020   Lab Results  Component Value Date   TRIG 224 (H) 03/16/2020   Lab Results  Component Value Date   CHOLHDL 5.6 (H) 03/16/2020   Lab Results  Component Value Date   HGBA1C 6.0 (H) 03/16/2020      Assessment & Plan:  Patient tolerated testosterone 200 mg injection to LUOQ well without complications. Patient advised to schedule next injection in 14 days.  Problem List Items Addressed This Visit      Endocrine   Hypogonadism in male - Primary      Meds ordered this encounter  Medications  . testosterone cypionate (DEPOTESTOSTERONE CYPIONATE) injection 200 mg    Follow-up: Return in about 2 weeks (around 11/15/2020) for Testosterone Injection.    Ninfa Meeker, CMA

## 2020-11-02 ENCOUNTER — Ambulatory Visit: Payer: Medicare HMO

## 2020-11-10 ENCOUNTER — Other Ambulatory Visit: Payer: Self-pay | Admitting: Family Medicine

## 2020-11-21 ENCOUNTER — Ambulatory Visit: Payer: Self-pay | Admitting: Neurology

## 2020-11-22 ENCOUNTER — Other Ambulatory Visit: Payer: Self-pay

## 2020-11-22 ENCOUNTER — Encounter: Payer: Self-pay | Admitting: Neurology

## 2020-11-22 ENCOUNTER — Ambulatory Visit (INDEPENDENT_AMBULATORY_CARE_PROVIDER_SITE_OTHER): Payer: Medicare HMO | Admitting: Osteopathic Medicine

## 2020-11-22 ENCOUNTER — Ambulatory Visit: Payer: Medicare HMO | Admitting: Neurology

## 2020-11-22 VITALS — BP 160/89 | HR 66 | Ht 66.0 in | Wt 257.0 lb

## 2020-11-22 VITALS — BP 148/70 | HR 86 | Temp 98.0°F | Resp 20 | Ht 66.0 in | Wt 257.0 lb

## 2020-11-22 DIAGNOSIS — E291 Testicular hypofunction: Secondary | ICD-10-CM

## 2020-11-22 DIAGNOSIS — G20 Parkinson's disease: Secondary | ICD-10-CM | POA: Diagnosis not present

## 2020-11-22 DIAGNOSIS — M259 Joint disorder, unspecified: Secondary | ICD-10-CM

## 2020-11-22 DIAGNOSIS — G4752 REM sleep behavior disorder: Secondary | ICD-10-CM

## 2020-11-22 DIAGNOSIS — Z820 Family history of epilepsy and other diseases of the nervous system: Secondary | ICD-10-CM

## 2020-11-22 MED ORDER — TESTOSTERONE CYPIONATE 200 MG/ML IM SOLN
200.0000 mg | Freq: Once | INTRAMUSCULAR | Status: AC
Start: 2020-11-22 — End: 2020-11-22
  Administered 2020-11-22: 200 mg via INTRAMUSCULAR

## 2020-11-22 NOTE — Progress Notes (Signed)
Subjective:    Patient ID: Jonathan Yu is a 70 y.o. male.  HPI     Star Age, MD, PhD [facility] [address] [place], [place] [address]  Dear Dr. Zigmund Daniel,  I saw your patient, Jonathan Yu, upon your kind request in my neurologic clinic today for initial consultation of his tremor.  The patient is unaccompanied today. He missed an appointment on 09/20/20. As you know, [name] is a 70 year old right-handed gentleman with an underlying medical history of degenerative disc disease, hyperlipidemia, hypertension, diabetes, vitamin D deficiency, obstructive sleep apnea, arthritis, status post multiple surgeries allergies, low testosterone, and severe obesity with a BMI of over 40, who reports an approximately 2-year history of tremors affecting primarily the right side.  He first noticed right foot tremor.  His tremor has progressed, sometimes his left side feels worse, he has also noticed fine motor dyscontrol and difficulty with movement in general, feels fatigued.  His symptoms got worse when he had Covid some 18 months ago and also reports that his anxiety has become much more noticeable.  Previously, he was not an anxious person, now he is often anxious.  He does have a prescription for Xanax but rarely uses it.  He has not fallen recently.  He tries to stay active.  He has multiple joint related issues including right ankle replacement, left ankle injury and repair some 50 years ago, no hardware in place but right ankle replacement some 10 years ago after an accident.  He quit working after that.  He also has right knee pain and bilateral rotator cuff problems, status post surgeries twice on the right, no surgery on the left but significant decrease in range of motion in the left shoulder.  He works with a physical therapist about once a week.  He has not been exercising as such on a regular basis.  He is generally concerned about his  constellation of symptoms such as tremors, mobility issues, problems with fatigue and overall deconditioning.  He is divorced and lives with his youngest daughter who is 41 and his son who is 72, and divorced.  He has a total of 6 children.  He himself is the oldest of 5 kids, had 2 sisters and 2 brothers, younger sister passed away.  His father had Parkinson's disease later in life, lived to be 15, mom died from lung cancer at 67.  He does not smoke cigarettes.  He does occasionally smoke a cigar but none in a year approximately.  He drinks alcohol rarely.  He does not drink caffeine on a day-to-day basis.  He uses his CPAP faithfully.  He is retired from working with [employer] for many years.  He does have a remote history of dream enactment behaviors.  These have been infrequently now.  He fell out of bed some 3 years ago as he was dreaming about fighting somebody.  He does have a history of sleep talking.  I reviewed your office note from 05/14/2021. He had blood work through your office on 09/07/2020 and I reviewed the results, vitamin D was mildly low at 27.  His TSH was normal in August 2021.  His B12 was 407 on 03/16/2020.  A1c was 6.0.  His Past Medical History Is Significant For: Past Medical History:  Diagnosis Date  . Allergy   . DDD (degenerative disc disease), lumbar   . Hyperlipidemia    "no meds since losing 106# 2 yr ago" (10/06/2013)  .  Hypertension 2012   "no meds since losing 106# 2 yr ago" (10/06/2013)  . OSA on CPAP    "don't wear mask much since losing 106# 2 yr ago" (10/06/2013)  . Type II diabetes mellitus ([place])    "no meds since losing 106# 2 yr ago" (10/06/2013)  . Walking pneumonia 1968    His Past Surgical History Is Significant For: Past Surgical History:  Procedure Laterality Date  . ANKLE FRACTURE SURGERY Left 1973  . ANKLE RECONSTRUCTION Right 05/2011  . CARPAL TUNNEL RELEASE Bilateral 2001  . HARDWARE REMOVAL Right 10/07/2013   Procedure: RIGHT ANKLE HARDWARE  REMOVAL;  Surgeon: Newt Minion, MD;  Location: [place];  Service: Orthopedics;  Laterality: Right;  . I & D EXTREMITY Right 11/18/2013   Procedure: IRRIGATION AND DEBRIDEMENT EXTREMITY;  Surgeon: Newt Minion, MD;  Location: [place];  Service: Orthopedics;  Laterality: Right;  Irrigation and Debridement Right Fibula , Place Antibiotic Beads and VAC  . NASAL SEPTUM SURGERY  1982  . ORIF ANKLE FRACTURE Right 09/08/2013   Procedure: REPAIR SYNDESMOSIS DISRUPTION RIGHT ANKLE;  Surgeon: Newt Minion, MD;  Location: [place];  Service: Orthopedics;  Laterality: Right;  . SHOULDER ARTHROSCOPY W/ ROTATOR CUFF REPAIR Right 2006; 2009  . TONSILLECTOMY  1959  . TOTAL ANKLE ARTHROPLASTY Right 08/18/2013   Procedure: TOTAL ANKLE ARTHOPLASTY;  Surgeon: Newt Minion, MD;  Location: [place];  Service: Orthopedics;  Laterality: Right;  Right Total Ankle Arthroplasty, Revision Fibular Fracture  . URETERAL STENT PLACEMENT  ~ 2010 X 2    His Family History Is Significant For: Family History  Problem Relation Age of Onset  . Cancer Mother 69       Lung  . Alzheimer's disease Father   . Parkinson's disease Father   . Scoliosis Sister   . Cleft lip Sister     His Social History Is Significant For: Social History   Socioeconomic History  . Marital status: Divorced    Spouse name: Not on file  . Number of children: 5  . Years of education: Not on file  . Highest education level: Not on file  Occupational History  . Occupation: sales/ DJ  Tobacco Use  . Smoking status: Current Some Day Smoker    Years: 6.00    Types: Pipe, Cigars  . Smokeless tobacco: Never Used  . Tobacco comment: 10/06/2013 "use pipe or cigar 1-2 times/month"  Substance and Sexual Activity  . Alcohol use: Yes    Comment: 10/06/2013 "have 4-5 drinks/yr"  . Drug use: No  . Sexual activity: Yes    Comment: 1 every 2 months  Other Topics Concern  . Not on file  Social History Narrative  . Not on file   Social Determinants of Health    Financial Resource Strain: Not on file  Food Insecurity: Not on file  Transportation Needs: Not on file  Physical Activity: Not on file  Stress: Not on file  Social Connections: Not on file    His Allergies Are:  Allergies  Allergen Reactions  . Sesame Oil Hives and Shortness Of Breath  . Clonazepam Hives  . Percocet [Oxycodone-Acetaminophen] Rash  . Topamax [Topiramate]     Cognitive decline  . Vicodin [Hydrocodone-Acetaminophen] Rash  :   His Current Medications Are:  Outpatient Encounter Medications as of 11/22/2020  Medication Sig  . albuterol (PROVENTIL HFA;VENTOLIN HFA) 108 (90 Base) MCG/ACT inhaler Inhale 2 puffs into the lungs every 6 (six) hours as needed for  wheezing or shortness of breath.  . ALPRAZolam (XANAX) 0.5 MG tablet Take 0.5-1 tablets (0.25-0.5 mg total) by mouth at bedtime as needed for sleep.  . AMBULATORY NON FORMULARY MEDICATION Continue CPAP at current settings.  Please provide new supplies. Micheal Likens phone number [phone]  . AMBULATORY NON FORMULARY MEDICATION Compression stockings.  Vive size LGM Leg swelling. Disp 1 pair 99 refil.  Marland Kitchen anastrozole (ARIMIDEX) 1 MG tablet Take 1 tablet by mouth once daily  . atorvastatin (LIPITOR) 40 MG tablet Take 1 tablet (40 mg total) by mouth daily.  . Azelaic Acid 15 % cream Apply 1 application topically 2 (two) times daily.  Marland Kitchen docusate sodium (COLACE) 50 MG capsule Take 50 mg by mouth daily.  . fluticasone (FLONASE) 50 MCG/ACT nasal spray Place into both nostrils daily.  Marland Kitchen loratadine (CLARITIN) 10 MG tablet Take 10 mg by mouth daily.  Marland Kitchen losartan (COZAAR) 50 MG tablet Take 1 tablet (50 mg total) by mouth daily.  . Misc. Devices (FLEX THERAPY) MISC by Does not apply route.  . Multiple Vitamin (MULTIVITAMIN WITH MINERALS) TABS tablet Take 1 tablet by mouth daily.  . Probiotic Product (PROBIOTIC PO) Take by mouth daily.  . tadalafil (CIALIS) 20 MG tablet TAKE HALF TO ONE TABLET BY MOUTH EVERY OTHER DAY AS  NEEDED FOR ERECTILE DYSFUNCTION  . tamsulosin (FLOMAX) 0.4 MG CAPS capsule Take 1 capsule (0.4 mg total) by mouth daily after breakfast.  . VITAMIN D PO Take by mouth.   Facility-Administered Encounter Medications as of 11/22/2020  Medication  . testosterone cypionate (DEPOTESTOSTERONE CYPIONATE) injection 200 mg  :   Review of Systems:  Out of a complete 14 point review of systems, all are reviewed and negative with the exception of these symptoms as listed below: Review of Systems  Neurological:       Presents for consult on worsening hand and leg tremors. Pt reports he was dx with covid back in 2020 and noticed the tremors starting around that time. Also reports a slow progression of sx since 2020. Pt reports it could be anxiety related- also reports a weakness in his abdomen since having covid.     Objective:  Neurological Exam  Physical Exam Physical Examination:   Vitals:   11/22/20 1018  BP: (!) 160/89  Pulse: 66   General Examination: The patient is a very pleasant 70 y.o. male in no acute distress. He appears well-developed and well-nourished and very well groomed.  He is somewhat anxious appearing.  HEENT: Normocephalic, atraumatic, pupils are equal, round and reactive to light, extraocular tracking is generally preserved, mild decrease in eye blink rate noted and mild facial masking noted.  Mild nuchal rigidity noted, no lip, neck or jaw tremor, no carotid bruits.  Airway examination reveals mild mouth dryness, tongue protrudes centrally and palate elevates symmetrically.  Moderate airway crowding.  No significant hypophonia, no dysarthria noted.   Chest: Clear to auscultation without wheezing, rhonchi or crackles noted.  Heart: S1+S2+0, regular and normal without murmurs, rubs or gallops noted.   Abdomen: Soft, non-tender and non-distended with normal bowel sounds appreciated on auscultation.  Extremities: There is trace pitting edema in the distal lower extremities  bilaterally.  Left leg larger in caliber than right.    Skin: Warm and dry without trophic changes noted.   Musculoskeletal: exam reveals decreased range of motion in both shoulders, right shoulder is higher than left.  Scar left ankle and distal leg, scar right ankle.     Neurologically:  Mental status: The patient is awake, alert and oriented in all 4 spheres. His immediate and remote memory, attention, language skills and fund of knowledge are appropriate. There is no evidence of aphasia, agnosia, apraxia or anomia. Mood is normal and affect is normal.  Cranial nerves II - XII are as described above under HEENT exam.  Unequalshoulder height noted.   Motor exam: Normal bulk, mild increase in tone in the right more than left upper extremity, no telltale cogwheeling.  He has a slight and intermittent resting tremor in the right upper and right lower extremity.  He has no significant postural or action tremor.   On 11/22/2020: On Archimedes spiral drawing he has no  significant trembling with the right hand which is his dominant hand, insecurity noted with the left hand but no trembling as such, handwriting is on the smaller side, legible.    Romberg is negative.  Reflexes are diminished throughout.  Toes are downgoing.  Fine motor skills shows mild to moderate impairment with finger taps on the right side, slightly better on the left, foot taps are mild to moderately impaired bilaterally.  Rapid alternating patting and hand movements are fairly well-preserved bilaterally.   Cerebellar testing: No dysmetria or intention tremor on finger to nose testing. Heel to shin is unremarkable bilaterally. There is no truncal or gait ataxia.  Sensory exam: intact to light touch in the upper and lower extremities.  Gait, station and balance: He stands with mild difficulty, pushes himself up, posture is age-appropriate.  He walks with no telltale shuffling but decreased arm swing on the right.  He walks slightly  slowly, turns slowly.   Assessment and Plan:   In summary, Mikal Blasdell is a very pleasant 70 y.o.-year old male with an underlying medical history of degenerative disc disease, hyperlipidemia, hypertension, diabetes, vitamin D deficiency, obstructive sleep apnea, arthritis, status post multiple surgeries allergies, low testosterone, and severe obesity with a BMI of over 40, who presents for evaluation of his tremor disorder.  Symptoms include tremor affecting his right side, fine motor dyscontrol, general difficulty with mobility, deconditioning, exacerbated by anxiety, symptoms also overall got worse after he was diagnosed with COVID some 18 months ago.  He was not hospitalized, he had gone to the emergency room twice.  Antiviral treatment was not available at the time.  History and examination are concerning for parkinsonism, some right-sided lateralization is noted, no telltale tremor predominance at this time but he does have features of parkinsonism, likely primary parkinsonism, no obvious atypical features at this time.  I had a long discussion with the patient about this, including his symptoms, my findings, the possible diagnosis.  I would like to proceed with a imaging scan called DaTscan, this is not a definitive scan for Parkinson's disease but I helpful to when there is concern for underlying parkinsonism more parkinsonian syndrome.  I explained the scan to him and he is agreeable to proceed and agreeable to signing a consent form.  He is working with a physical therapist which is beneficial.  He is wondering if he should go back to exercising more regularly.  He is encouraged to start exercising slowly and pace himself.  Recumbent bike would be a good choice.  I would like to avoid a treadmill at this time because he does have mobility issues and bilateral ankle surgeries in the past.  He has quite a few joint and musculoskeletal issues.  These are confounding the picture and complicating it.   Nevertheless,  we will proceed for now with a DaTscan and plan a follow-up afterwards, we will call him with his test results to keep him posted as well.  We talked about potentially utilizing symptomatic medication soon. We talked about the importance of healthy lifestyle, good nutrition, good hydration, getting enough sleep.  He reports being compliant with his CPAP.  He does have a history of sleep disturbance including possible underlying REM behavior disorder but dream enactment behavior is infrequent at this time and can be monitored.  We will plan a follow-up soon.  We will call him to schedule his DaTscan once we have insurance authorization.  I answered all his questions today and he was in agreement with this plan. Thank you very much for allowing me to participate in the care of this nice patient. If I can be of any further assistance to you please do not hesitate to call me at [phone].  Sincerely,   Star Age, MD, PhD

## 2020-11-22 NOTE — Progress Notes (Signed)
Established Patient Office Visit  Subjective:  Patient ID: Jonathan Yu, male    DOB: [birth year]  Age: 70 y.o. MRN: [id number]  CC:  Chief Complaint  Patient presents with  . Hypogonadism    HPI Jonathan Yu presents for a testosterone injection. Denies chest pain, shortness of breath, headaches, and problems with medication or mood changes.    Patient tolerated testosterone 200 mg injection to RUOQ well without complications. Patient advised to schedule next injection in 3 weeks.   Past Medical History:  Diagnosis Date  . Allergy   . DDD (degenerative disc disease), lumbar   . Hyperlipidemia    "no meds since losing 106# 2 yr ago" (10/06/2013)  . Hypertension 2012   "no meds since losing 106# 2 yr ago" (10/06/2013)  . OSA on CPAP    "don't wear mask much since losing 106# 2 yr ago" (10/06/2013)  . Type II diabetes mellitus ([place])    "no meds since losing 106# 2 yr ago" (10/06/2013)  . Walking pneumonia 1968    Past Surgical History:  Procedure Laterality Date  . ANKLE FRACTURE SURGERY Left 1973  . ANKLE RECONSTRUCTION Right 05/2011  . CARPAL TUNNEL RELEASE Bilateral 2001  . HARDWARE REMOVAL Right 10/07/2013   Procedure: RIGHT ANKLE HARDWARE REMOVAL;  Surgeon: Newt Minion, MD;  Location: [place];  Service: Orthopedics;  Laterality: Right;  . I & D EXTREMITY Right 11/18/2013   Procedure: IRRIGATION AND DEBRIDEMENT EXTREMITY;  Surgeon: Newt Minion, MD;  Location: [place];  Service: Orthopedics;  Laterality: Right;  Irrigation and Debridement Right Fibula , Place Antibiotic Beads and VAC  . NASAL SEPTUM SURGERY  1982  . ORIF ANKLE FRACTURE Right 09/08/2013   Procedure: REPAIR SYNDESMOSIS DISRUPTION RIGHT ANKLE;  Surgeon: Newt Minion, MD;  Location: [place];  Service: Orthopedics;  Laterality: Right;  . SHOULDER ARTHROSCOPY W/ ROTATOR CUFF REPAIR Right 2006; 2009  . TONSILLECTOMY  1959  . TOTAL ANKLE ARTHROPLASTY Right 08/18/2013   Procedure: TOTAL ANKLE ARTHOPLASTY;  Surgeon:  Newt Minion, MD;  Location: [place];  Service: Orthopedics;  Laterality: Right;  Right Total Ankle Arthroplasty, Revision Fibular Fracture  . URETERAL STENT PLACEMENT  ~ 2010 X 2    Family History  Problem Relation Age of Onset  . Cancer Mother 71       Lung  . Alzheimer's disease Father   . Parkinson's disease Father   . Scoliosis Sister   . Cleft lip Sister     Social History   Socioeconomic History  . Marital status: Divorced    Spouse name: Not on file  . Number of children: 5  . Years of education: Not on file  . Highest education level: Not on file  Occupational History  . Occupation: sales/ DJ  Tobacco Use  . Smoking status: Current Some Day Smoker    Years: 6.00    Types: Pipe, Cigars  . Smokeless tobacco: Never Used  . Tobacco comment: 10/06/2013 "use pipe or cigar 1-2 times/month"  Substance and Sexual Activity  . Alcohol use: Yes    Comment: 10/06/2013 "have 4-5 drinks/yr"  . Drug use: No  . Sexual activity: Yes    Comment: 1 every 2 months  Other Topics Concern  . Not on file  Social History Narrative  . Not on file   Social Determinants of Health   Financial Resource Strain: Not on file  Food Insecurity: Not on file  Transportation Needs: Not on file  Physical  Activity: Not on file  Stress: Not on file  Social Connections: Not on file  Intimate Partner Violence: Not on file    Outpatient Medications Prior to Visit  Medication Sig Dispense Refill  . albuterol (PROVENTIL HFA;VENTOLIN HFA) 108 (90 Base) MCG/ACT inhaler Inhale 2 puffs into the lungs every 6 (six) hours as needed for wheezing or shortness of breath. 1 Inhaler 0  . ALPRAZolam (XANAX) 0.5 MG tablet Take 0.5-1 tablets (0.25-0.5 mg total) by mouth at bedtime as needed for sleep. 20 tablet 3  . AMBULATORY NON FORMULARY MEDICATION Continue CPAP at current settings.  Please provide new supplies. Micheal Likens phone number [phone] 1 each 0  . AMBULATORY NON FORMULARY MEDICATION  Compression stockings.  Vive size LGM Leg swelling. Disp 1 pair 99 refil. 1 each 99  . anastrozole (ARIMIDEX) 1 MG tablet Take 1 tablet by mouth once daily 30 tablet 1  . atorvastatin (LIPITOR) 40 MG tablet Take 1 tablet (40 mg total) by mouth daily. 90 tablet 3  . Azelaic Acid 15 % cream Apply 1 application topically 2 (two) times daily. 50 g 12  . docusate sodium (COLACE) 50 MG capsule Take 50 mg by mouth daily.    . fluticasone (FLONASE) 50 MCG/ACT nasal spray Place into both nostrils daily.    Marland Kitchen loratadine (CLARITIN) 10 MG tablet Take 10 mg by mouth daily.    Marland Kitchen losartan (COZAAR) 50 MG tablet Take 1 tablet (50 mg total) by mouth daily. 90 tablet 3  . Misc. Devices (FLEX THERAPY) MISC by Does not apply route.    . Multiple Vitamin (MULTIVITAMIN WITH MINERALS) TABS tablet Take 1 tablet by mouth daily.    . Probiotic Product (PROBIOTIC PO) Take by mouth daily.    . tadalafil (CIALIS) 20 MG tablet TAKE HALF TO ONE TABLET BY MOUTH EVERY OTHER DAY AS NEEDED FOR ERECTILE DYSFUNCTION 30 tablet 12  . tamsulosin (FLOMAX) 0.4 MG CAPS capsule Take 1 capsule (0.4 mg total) by mouth daily after breakfast. 90 capsule 3  . VITAMIN D PO Take by mouth.     Facility-Administered Medications Prior to Visit  Medication Dose Route Frequency Provider Last Rate Last Admin  . testosterone cypionate (DEPOTESTOSTERONE CYPIONATE) injection 200 mg  200 mg Intramuscular Q21 days Luetta Nutting, DO   200 mg at 08/03/20 1006    Allergies  Allergen Reactions  . Sesame Oil Hives and Shortness Of Breath  . Clonazepam Hives  . Percocet [Oxycodone-Acetaminophen] Rash  . Topamax [Topiramate]     Cognitive decline  . Vicodin [Hydrocodone-Acetaminophen] Rash    ROS Review of Systems    Objective:    Physical Exam  BP (!) 148/70 (BP Location: Left Arm, Patient Position: Sitting, Cuff Size: Large)   Pulse 86   Temp 98 F (36.7 C) (Oral)   Resp 20   Ht 5\' 6"  (1.676 m)   Wt 257 lb (116.6 kg)   SpO2 96%   BMI  41.48 kg/m  Wt Readings from Last 3 Encounters:  11/22/20 257 lb (116.6 kg)  11/22/20 257 lb (116.6 kg)  11/01/20 262 lb (118.8 kg)     Health Maintenance Due  Topic Date Due  . COVID-19 Vaccine (3 - Pfizer risk 4-dose series) 12/08/2019    There are no preventive care reminders to display for this patient.  Lab Results  Component Value Date   TSH 3.21 03/16/2020   Lab Results  Component Value Date   WBC 7.7 09/07/2020   HGB 18.1 (H)  09/07/2020   HCT 52.4 (H) 09/07/2020   MCV 89.3 09/07/2020   PLT 201 09/07/2020   Lab Results  Component Value Date   NA 137 03/16/2020   K 4.2 03/16/2020   CO2 29 03/16/2020   GLUCOSE 122 (H) 03/16/2020   BUN 12 03/16/2020   CREATININE 1.03 03/16/2020   BILITOT 2.2 (H) 03/16/2020   ALKPHOS 39 (L) 01/07/2017   AST 21 03/16/2020   ALT 30 03/16/2020   PROT 6.9 03/16/2020   ALBUMIN 3.5 (L) 01/07/2017   CALCIUM 9.3 03/16/2020   ANIONGAP 11 07/31/2019   Lab Results  Component Value Date   CHOL 197 03/16/2020   Lab Results  Component Value Date   HDL 35 (L) 03/16/2020   Lab Results  Component Value Date   LDLCALC 126 (H) 03/16/2020   Lab Results  Component Value Date   TRIG 224 (H) 03/16/2020   Lab Results  Component Value Date   CHOLHDL 5.6 (H) 03/16/2020   Lab Results  Component Value Date   HGBA1C 6.0 (H) 03/16/2020      Assessment & Plan:  Patient tolerated testosterone 200 mg injection to RUOQ well without complications. Patient advised to schedule next injection in 3 weeks.  Problem List Items Addressed This Visit      Endocrine   Hypogonadism in male - Primary      Meds ordered this encounter  Medications  . testosterone cypionate (DEPOTESTOSTERONE CYPIONATE) injection 200 mg    Follow-up: Return in about 3 weeks (around 12/13/2020) for Testosterone Injection.    Ninfa Meeker, CMA

## 2020-11-22 NOTE — Patient Instructions (Signed)
I think you have signs and symptoms of Parkinson's disease, with more right sided findings. As explained, there are medication treatment options, but this disease does progress with time. It can affect your balance, your memory, your mood, your bowel and bladder function, your posture, balance and walking and your activities of daily living. However, there are good supportive treatments and symptomatic treatments available, so most patients have a change to a good quality life and life expectancy is not typically altered. Overall you are doing fairly healthwise, but you do have challenges with your joints.   I want to suggest a few things today:  Remember to drink plenty of fluid at least 6 glasses (8 oz each), eat healthy meals and do not skip any meals. Try to eat protein with a every meal and eat a healthy snack such as fruit or nuts in between meals. Try to keep a regular sleep-wake schedule and try to exercise daily, particularly in the form of walking, or using a recumbent bike 20-30 minutes a day, if you can.   Try to stay active physically and mentally. Engage in social activities in your community and with your family and try to keep up with current events by reading the newspaper or watching the news. Try to do word puzzles and you may like to do puzzles and brain games on the computer such as on [email or web address]   As far as your medications are concerned, we will consider medication for symptomatic treatment at the next appointment.    As far as diagnostic testing, I will order: DaT scan: This is a specialized brain scan designed to help with diagnosis of tremor disorders. A radioactive marker gets injected and the uptake is measured in the brain and compared to normal controls and right side is compared to the left, a change in uptake can help with diagnosis of certain tremor disorders. A brain MRI on the other hand is a brain scan that helps look at the brain structure in more detail overall and  look for age-related changes, blood vessel related changes and look for stroke and volume loss which we call atrophy.    I would like to see you back after the scan, we will call you with the results as well to keep you updated.    Our phone number is [phone]. We also have an after hours call service for urgent matters and there is a physician on-call for urgent questions, that cannot wait till the next work day. For any emergencies you know to call 911 or go to the nearest emergency room.   You can email me through my chart and also leave a phone message for Jinny Blossom, my nurse.

## 2020-12-13 ENCOUNTER — Telehealth: Payer: Self-pay | Admitting: Neurology

## 2020-12-13 NOTE — Telephone Encounter (Signed)
[employer] no auth. Patient is scheduled at [facility] long for 12/26/20.

## 2020-12-14 ENCOUNTER — Ambulatory Visit (INDEPENDENT_AMBULATORY_CARE_PROVIDER_SITE_OTHER): Payer: Medicare HMO | Admitting: Family Medicine

## 2020-12-14 ENCOUNTER — Other Ambulatory Visit: Payer: Self-pay

## 2020-12-14 VITALS — BP 137/70 | HR 75

## 2020-12-14 DIAGNOSIS — E291 Testicular hypofunction: Secondary | ICD-10-CM | POA: Diagnosis not present

## 2020-12-14 MED ORDER — TESTOSTERONE CYPIONATE 200 MG/ML IM SOLN
100.0000 mg | Freq: Once | INTRAMUSCULAR | Status: AC
  Administered 2020-12-14: 100 mg via INTRAMUSCULAR

## 2020-12-14 NOTE — Progress Notes (Signed)
Established Patient Office Visit  Subjective:  Patient ID: Jonathan Yu, male    DOB: 04/15/1951  Age: 70 y.o. MRN: [id number]  CC:  Chief Complaint  Patient presents with  . Hypogonadism    HPI Jonathan Yu is here for a testosterone injection. Denies chest pain, shortness of breath, headaches or mood changes.   Past Medical History:  Diagnosis Date  . Allergy   . DDD (degenerative disc disease), lumbar   . Hyperlipidemia    "no meds since losing 106# 2 yr ago" (10/06/2013)  . Hypertension 2012   "no meds since losing 106# 2 yr ago" (10/06/2013)  . OSA on CPAP    "don't wear mask much since losing 106# 2 yr ago" (10/06/2013)  . Type II diabetes mellitus ([place])    "no meds since losing 106# 2 yr ago" (10/06/2013)  . Walking pneumonia 1968    Past Surgical History:  Procedure Laterality Date  . ANKLE FRACTURE SURGERY Left 1973  . ANKLE RECONSTRUCTION Right 05/2011  . CARPAL TUNNEL RELEASE Bilateral 2001  . HARDWARE REMOVAL Right 10/07/2013   Procedure: RIGHT ANKLE HARDWARE REMOVAL;  Surgeon: Newt Minion, MD;  Location: [place];  Service: Orthopedics;  Laterality: Right;  . I & D EXTREMITY Right 11/18/2013   Procedure: IRRIGATION AND DEBRIDEMENT EXTREMITY;  Surgeon: Newt Minion, MD;  Location: [place];  Service: Orthopedics;  Laterality: Right;  Irrigation and Debridement Right Fibula , Place Antibiotic Beads and VAC  . NASAL SEPTUM SURGERY  1982  . ORIF ANKLE FRACTURE Right 09/08/2013   Procedure: REPAIR SYNDESMOSIS DISRUPTION RIGHT ANKLE;  Surgeon: Newt Minion, MD;  Location: [place];  Service: Orthopedics;  Laterality: Right;  . SHOULDER ARTHROSCOPY W/ ROTATOR CUFF REPAIR Right 2006; 2009  . TONSILLECTOMY  1959  . TOTAL ANKLE ARTHROPLASTY Right 08/18/2013   Procedure: TOTAL ANKLE ARTHOPLASTY;  Surgeon: Newt Minion, MD;  Location: [place];  Service: Orthopedics;  Laterality: Right;  Right Total Ankle Arthroplasty, Revision Fibular Fracture  . URETERAL STENT PLACEMENT  ~ 2010  X 2    Family History  Problem Relation Age of Onset  . Cancer Mother 81       Lung  . Alzheimer's disease Father   . Parkinson's disease Father   . Scoliosis Sister   . Cleft lip Sister     Social History   Socioeconomic History  . Marital status: Divorced    Spouse name: Not on file  . Number of children: 5  . Years of education: Not on file  . Highest education level: Not on file  Occupational History  . Occupation: sales/ DJ  Tobacco Use  . Smoking status: Current Some Day Smoker    Years: 6.00    Types: Pipe, Cigars  . Smokeless tobacco: Never Used  . Tobacco comment: 10/06/2013 "use pipe or cigar 1-2 times/month"  Substance and Sexual Activity  . Alcohol use: Yes    Comment: 10/06/2013 "have 4-5 drinks/yr"  . Drug use: No  . Sexual activity: Yes    Comment: 1 every 2 months  Other Topics Concern  . Not on file  Social History Narrative  . Not on file   Social Determinants of Health   Financial Resource Strain: Not on file  Food Insecurity: Not on file  Transportation Needs: Not on file  Physical Activity: Not on file  Stress: Not on file  Social Connections: Not on file  Intimate Partner Violence: Not on file    Outpatient  Medications Prior to Visit  Medication Sig Dispense Refill  . albuterol (PROVENTIL HFA;VENTOLIN HFA) 108 (90 Base) MCG/ACT inhaler Inhale 2 puffs into the lungs every 6 (six) hours as needed for wheezing or shortness of breath. 1 Inhaler 0  . ALPRAZolam (XANAX) 0.5 MG tablet Take 0.5-1 tablets (0.25-0.5 mg total) by mouth at bedtime as needed for sleep. 20 tablet 3  . AMBULATORY NON FORMULARY MEDICATION Continue CPAP at current settings.  Please provide new supplies. Micheal Likens phone number [phone] 1 each 0  . AMBULATORY NON FORMULARY MEDICATION Compression stockings.  Vive size LGM Leg swelling. Disp 1 pair 99 refil. 1 each 99  . anastrozole (ARIMIDEX) 1 MG tablet Take 1 tablet by mouth once daily 30 tablet 1  .  atorvastatin (LIPITOR) 40 MG tablet Take 1 tablet (40 mg total) by mouth daily. 90 tablet 3  . Azelaic Acid 15 % cream Apply 1 application topically 2 (two) times daily. 50 g 12  . docusate sodium (COLACE) 50 MG capsule Take 50 mg by mouth daily.    . fluticasone (FLONASE) 50 MCG/ACT nasal spray Place into both nostrils daily.    Marland Kitchen loratadine (CLARITIN) 10 MG tablet Take 10 mg by mouth daily.    Marland Kitchen losartan (COZAAR) 50 MG tablet Take 1 tablet (50 mg total) by mouth daily. 90 tablet 3  . Misc. Devices (FLEX THERAPY) MISC by Does not apply route.    . Multiple Vitamin (MULTIVITAMIN WITH MINERALS) TABS tablet Take 1 tablet by mouth daily.    . Probiotic Product (PROBIOTIC PO) Take by mouth daily.    . tadalafil (CIALIS) 20 MG tablet TAKE HALF TO ONE TABLET BY MOUTH EVERY OTHER DAY AS NEEDED FOR ERECTILE DYSFUNCTION 30 tablet 12  . tamsulosin (FLOMAX) 0.4 MG CAPS capsule Take 1 capsule (0.4 mg total) by mouth daily after breakfast. 90 capsule 3  . VITAMIN D PO Take by mouth.     Facility-Administered Medications Prior to Visit  Medication Dose Route Frequency Provider Last Rate Last Admin  . testosterone cypionate (DEPOTESTOSTERONE CYPIONATE) injection 200 mg  200 mg Intramuscular Q21 days Luetta Nutting, DO   200 mg at 08/03/20 1006    Allergies  Allergen Reactions  . Sesame Oil Hives and Shortness Of Breath  . Clonazepam Hives  . Percocet [Oxycodone-Acetaminophen] Rash  . Topamax [Topiramate]     Cognitive decline  . Vicodin [Hydrocodone-Acetaminophen] Rash    ROS Review of Systems    Objective:    Physical Exam  BP (!) 149/62   Pulse 75   SpO2 97%  Wt Readings from Last 3 Encounters:  11/22/20 257 lb (116.6 kg)  11/22/20 257 lb (116.6 kg)  11/01/20 262 lb (118.8 kg)     Health Maintenance Due  Topic Date Due  . COVID-19 Vaccine (3 - Pfizer risk 4-dose series) 12/08/2019    There are no preventive care reminders to display for this patient.  Lab Results  Component  Value Date   TSH 3.21 03/16/2020   Lab Results  Component Value Date   WBC 7.7 09/07/2020   HGB 18.1 (H) 09/07/2020   HCT 52.4 (H) 09/07/2020   MCV 89.3 09/07/2020   PLT 201 09/07/2020   Lab Results  Component Value Date   NA 137 03/16/2020   K 4.2 03/16/2020   CO2 29 03/16/2020   GLUCOSE 122 (H) 03/16/2020   BUN 12 03/16/2020   CREATININE 1.03 03/16/2020   BILITOT 2.2 (H) 03/16/2020   ALKPHOS 39 (L)  01/07/2017   AST 21 03/16/2020   ALT 30 03/16/2020   PROT 6.9 03/16/2020   ALBUMIN 3.5 (L) 01/07/2017   CALCIUM 9.3 03/16/2020   ANIONGAP 11 07/31/2019   Lab Results  Component Value Date   CHOL 197 03/16/2020   Lab Results  Component Value Date   HDL 35 (L) 03/16/2020   Lab Results  Component Value Date   LDLCALC 126 (H) 03/16/2020   Lab Results  Component Value Date   TRIG 224 (H) 03/16/2020   Lab Results  Component Value Date   CHOLHDL 5.6 (H) 03/16/2020   Lab Results  Component Value Date   HGBA1C 6.0 (H) 03/16/2020      Assessment & Plan:  Patient tolerated injection well without complications. Patient advised to schedule next injection 21 days from today.   Problem List Items Addressed This Visit    Hypogonadism in male - Primary      Meds ordered this encounter  Medications  . testosterone cypionate (DEPOTESTOSTERONE CYPIONATE) injection 100 mg    Follow-up: Return in about 3 weeks (around 01/04/2021) for testosterone injection. Durene Romans, Monico Blitz, [place]

## 2020-12-14 NOTE — Progress Notes (Signed)
Agree with documentation as above.   Vasili Fok, MD  

## 2020-12-26 ENCOUNTER — Encounter (HOSPITAL_COMMUNITY)
Admission: RE | Admit: 2020-12-26 | Discharge: 2020-12-26 | Disposition: A | Discharge status: Home / Self Care | Payer: Medicare HMO | Source: Ambulatory Visit | Attending: Neurology | Admitting: Neurology

## 2020-12-26 ENCOUNTER — Ambulatory Visit (HOSPITAL_COMMUNITY)
Admission: RE | Admit: 2020-12-26 | Discharge: 2020-12-26 | Disposition: A | Discharge status: Home / Self Care | Payer: Medicare HMO | Source: Ambulatory Visit | Attending: Neurology | Admitting: Neurology

## 2020-12-26 ENCOUNTER — Other Ambulatory Visit: Payer: Self-pay

## 2020-12-26 DIAGNOSIS — Z820 Family history of epilepsy and other diseases of the nervous system: Secondary | ICD-10-CM | POA: Diagnosis not present

## 2020-12-26 DIAGNOSIS — M259 Joint disorder, unspecified: Secondary | ICD-10-CM | POA: Diagnosis not present

## 2020-12-26 DIAGNOSIS — G20 Parkinson's disease: Secondary | ICD-10-CM

## 2020-12-26 MED ORDER — POTASSIUM IODIDE (ANTIDOTE) 130 MG PO TABS
130.0000 mg | ORAL_TABLET | Freq: Once | ORAL | Status: AC
  Administered 2020-12-26: 130 mg via ORAL

## 2020-12-26 MED ORDER — POTASSIUM IODIDE (ANTIDOTE) 130 MG PO TABS
ORAL_TABLET | ORAL | Status: AC
  Filled 2020-12-26: qty 1

## 2020-12-26 MED ORDER — IOFLUPANE I 123 185 MBQ/2.5ML IV SOLN
5.0000 | Freq: Once | INTRAVENOUS | Status: AC | PRN
  Administered 2020-12-26: 5 via INTRAVENOUS
  Filled 2020-12-26: qty 5

## 2020-12-27 ENCOUNTER — Telehealth: Payer: Self-pay | Admitting: *Deleted

## 2020-12-27 NOTE — Telephone Encounter (Signed)
Took call from phone staff and spoke with pt. Relayed results of DAT scan per Dr. Guadelupe Sabin note. He verbalized understanding. Scheduled follow up for 12/28/20 at 10:30am for him to see Dr. Rexene Alberts to discuss treatment options.

## 2020-12-27 NOTE — Telephone Encounter (Signed)
LVM for pt to call about results. °

## 2020-12-27 NOTE — Telephone Encounter (Signed)
-----   Message from Star Age, MD sent at 12/27/2020  3:23 PM EDT ----- These call patient regarding the recent nuclear medicine DaT scan result. As discussed, this is a specialized brain scan designed to help with diagnosis of tremor disorders, including parkinsonian disorders. A radioactive marker gets injected and the uptake is measured in the brain and compared to normal controls and right side is compared to the left. A change in uptake can help with diagnosis of certain tremor disorders and narrow down the diagnostic possibilities. The patient's recent scan indicated abnormal (as in lower) uptake as compared to normal uptake pattern indicating an underlying parkinsonian disorder including the possibility of Parkinson's disease as we discussed.  I would like to see him in follow-up so we can talk about possible medication options.  Please facilitate follow-up appointment.

## 2020-12-27 NOTE — Progress Notes (Signed)
These call patient regarding the recent nuclear medicine DaT scan result. As discussed, this is a specialized brain scan designed to help with diagnosis of tremor disorders, including parkinsonian disorders. A radioactive marker gets injected and the uptake is measured in the brain and compared to normal controls and right side is compared to the left. A change in uptake can help with diagnosis of certain tremor disorders and narrow down the diagnostic possibilities. The patient's recent scan indicated abnormal (as in lower) uptake as compared to normal uptake pattern indicating an underlying parkinsonian disorder including the possibility of Parkinson's disease as we discussed.  I would like to see him in follow-up so we can talk about possible medication options.  Please facilitate follow-up appointment.

## 2020-12-28 ENCOUNTER — Encounter: Payer: Self-pay | Admitting: Neurology

## 2020-12-28 ENCOUNTER — Ambulatory Visit: Payer: Medicare HMO | Admitting: Neurology

## 2020-12-28 VITALS — BP 136/84 | HR 76 | Ht 66.0 in | Wt 258.0 lb

## 2020-12-28 DIAGNOSIS — M25472 Effusion, left ankle: Secondary | ICD-10-CM

## 2020-12-28 DIAGNOSIS — K5900 Constipation, unspecified: Secondary | ICD-10-CM

## 2020-12-28 DIAGNOSIS — M25471 Effusion, right ankle: Secondary | ICD-10-CM | POA: Diagnosis not present

## 2020-12-28 DIAGNOSIS — G20 Parkinson's disease: Secondary | ICD-10-CM

## 2020-12-28 MED ORDER — ROTIGOTINE 2 MG/24HR TD PT24: 1.0000 | MEDICATED_PATCH | Freq: Every day | TRANSDERMAL | 2 refills | Status: DC

## 2020-12-28 NOTE — Patient Instructions (Signed)
It was nice to see you again today.  As discussed, for your Parkinson symptoms we will start you on a new medication called Neupro: (rotigotine patch) 2 mg/24 hour: Use 1 patch daily.  Please remember to peel off the patch slowly.  Always remove the old patch and then place the new patch.  Please dispose the old patch carefully, if you have small children around you or any pets, you do not want to have your old patch or unused patches accessible to them.   Common side effects reported are: Sedation, sleepiness, nausea, vomiting, and rare side effects are confusion, hallucinations, swelling in legs, and abnormal behaviors, including impulse control problems, which can manifest as excessive eating, obsessions with food or gambling, or hypersexuality.  Please give [place] a call or better yet email [place] through [place] for an update in about 3 weeks after you have been able to start the patches.  We will think about increasing you to the next dose which would be 2 mg patches before your next appointment.

## 2020-12-28 NOTE — Progress Notes (Signed)
Subjective:    Patient ID: Jonathan Yu is a 70 y.o. male.  HPI     Interim history:   Jonathan Yu is a 70 year old right-handed gentleman with an underlying medical history of degenerative disc disease, hyperlipidemia, hypertension, diabetes, vitamin D deficiency, obstructive sleep apnea (on PAP therapy), arthritis, status post multiple surgeries allergies, low testosterone, and severe obesity with a BMI of over 40, who presents for follow-up consultation of his parkinsonism after an interim DaTscan.  The patient is unaccompanied today.  I first met him on 11/22/2020 at the request of his primary care physician, at which time the patient gave an approximately 2-year history of tremors affecting primarily his right lower and upper extremities.  His presentation was concerning for parkinsonism.  He was advised to proceed with a nuclear medicine DaTscan.  He had a DaTscan on 12/26/2020 and I reviewed the results:  IMPRESSION: Marked decreased activity within the bilateral posterior striatum is a pattern typical of Parkinsonian syndrome pathology.   Of note, DaTSCAN is not diagnostic of Parkinsonian syndromes, which remains a clinical diagnosis. DaTscan is an adjuvant test to aid in the clinical diagnosis of Parkinsonian syndromes.  We called him with his test results.  Today, 12/28/2020: He reports feeling about the same.  He is without any new complaints.  He has not had any falls.  He is planning to exercise a little bit more regularly.  His daughter who is a nurse has sent him resonates as she found some evidence that these could be beneficial, 1-3 [place] nuts daily, for Parkinson's patients.  He is trying to stay active.  He tries to hydrate well.  He is working on weight loss.  The patient's allergies, current medications, family history, past medical history, past social history, past surgical history and problem list were reviewed and updated as appropriate.   Previously:   11/22/20:  (He) reports an approximately 2-year history of tremors affecting primarily the right side.  He first noticed right foot tremor.  His tremor has progressed, sometimes his left side feels worse, he has also noticed fine motor dyscontrol and difficulty with movement in general, feels fatigued.  His symptoms got worse when he had Covid some 18 months ago and also reports that his anxiety has become much more noticeable.  Previously, he was not an anxious person, now he is often anxious.  He does have a prescription for Xanax but rarely uses it.  He has not fallen recently.  He tries to stay active.  He has multiple joint related issues including right ankle replacement, left ankle injury and repair some 50 years ago, no hardware in place but right ankle replacement some 10 years ago after an accident.  He quit working after that.  He also has right knee pain and bilateral rotator cuff problems, status post surgeries twice on the right, no surgery on the left but significant decrease in range of motion in the left shoulder.  He works with a physical therapist about once a week.  He has not been exercising as such on a regular basis.  He is generally concerned about his constellation of symptoms such as tremors, mobility issues, problems with fatigue and overall deconditioning.  He is divorced and lives with his youngest daughter who is 67 and his son who is 79, and divorced.  He has a total of 6 children.  He himself is the oldest of 5 kids, had 2 sisters and 2 brothers, younger sister passed away.  His father  had Parkinson's disease later in life, lived to be 28, mom died from lung cancer at 52.  He does not smoke cigarettes.  He does occasionally smoke a cigar but none in a year approximately.  He drinks alcohol rarely.  He does not drink caffeine on a day-to-day basis.   He uses his CPAP faithfully.  He is retired from working with [employer] for many years.  He does have a remote history of dream enactment  behaviors.  These have been infrequently now.  He fell out of bed some 3 years ago as he was dreaming about fighting somebody.  He does have a history of sleep talking.   I reviewed your office note from 05/14/2021. He had blood work through your office on 09/07/2020 and I reviewed the results, vitamin D was mildly low at 27.  His TSH was normal in August 2021.  His B12 was 407 on 03/16/2020.  A1c was 6.0.  His Past Medical History Is Significant For: Past Medical History:  Diagnosis Date  . Allergy   . DDD (degenerative disc disease), lumbar   . Hyperlipidemia    "no meds since losing 106# 2 yr ago" (10/06/2013)  . Hypertension 2012   "no meds since losing 106# 2 yr ago" (10/06/2013)  . OSA on CPAP    "don't wear mask much since losing 106# 2 yr ago" (10/06/2013)  . Type II diabetes mellitus ([place])    "no meds since losing 106# 2 yr ago" (10/06/2013)  . Walking pneumonia 1968    His Past Surgical History Is Significant For: Past Surgical History:  Procedure Laterality Date  . ANKLE FRACTURE SURGERY Left 1973  . ANKLE RECONSTRUCTION Right 05/2011  . CARPAL TUNNEL RELEASE Bilateral 2001  . HARDWARE REMOVAL Right 10/07/2013   Procedure: RIGHT ANKLE HARDWARE REMOVAL;  Surgeon: Newt Minion, MD;  Location: [place];  Service: Orthopedics;  Laterality: Right;  . I & D EXTREMITY Right 11/18/2013   Procedure: IRRIGATION AND DEBRIDEMENT EXTREMITY;  Surgeon: Newt Minion, MD;  Location: [facility];  Service: Orthopedics;  Laterality: Right;  Irrigation and Debridement Right Fibula , Place Antibiotic Beads and VAC  . NASAL SEPTUM SURGERY  1982  . ORIF ANKLE FRACTURE Right 09/08/2013   Procedure: REPAIR SYNDESMOSIS DISRUPTION RIGHT ANKLE;  Surgeon: Newt Minion, MD;  Location: [facility];  Service: Orthopedics;  Laterality: Right;  . SHOULDER ARTHROSCOPY W/ ROTATOR CUFF REPAIR Right 2006; 2009  . TONSILLECTOMY  1959  . TOTAL ANKLE ARTHROPLASTY Right 08/18/2013   Procedure: TOTAL ANKLE ARTHOPLASTY;  Surgeon: Newt Minion, MD;  Location: [facility];  Service: Orthopedics;  Laterality: Right;  Right Total Ankle Arthroplasty, Revision Fibular Fracture  . URETERAL STENT PLACEMENT  ~ 2010 X 2    His Family History Is Significant For: Family History  Problem Relation Age of Onset  . Cancer Mother 82       Lung  . Alzheimer's disease Father   . Parkinson's disease Father   . Scoliosis Sister   . Cleft lip Sister     His Social History Is Significant For: Social History   Socioeconomic History  . Marital status: Divorced    Spouse name: Not on file  . Number of children: 5  . Years of education: Not on file  . Highest education level: Not on file  Occupational History  . Occupation: sales/ DJ  Tobacco Use  . Smoking status: Current Some Day Smoker    Years: 6.00  Types: Pipe, Cigars  . Smokeless tobacco: Never Used  . Tobacco comment: 10/06/2013 "use pipe or cigar 1-2 times/month"  Substance and Sexual Activity  . Alcohol use: Yes    Comment: 10/06/2013 "have 4-5 drinks/yr"  . Drug use: No  . Sexual activity: Yes    Comment: 1 every 2 months  Other Topics Concern  . Not on file  Social History Narrative  . Not on file   Social Determinants of Health   Financial Resource Strain: Not on file  Food Insecurity: Not on file  Transportation Needs: Not on file  Physical Activity: Not on file  Stress: Not on file  Social Connections: Not on file    His Allergies Are:  Allergies  Allergen Reactions  . Sesame Oil Hives and Shortness Of Breath  . Clonazepam Hives  . Percocet [Oxycodone-Acetaminophen] Rash  . Topamax [Topiramate]     Cognitive decline  . Vicodin [Hydrocodone-Acetaminophen] Rash  :   His Current Medications Are:  Outpatient Encounter Medications as of 12/28/2020  Medication Sig  . albuterol (PROVENTIL HFA;VENTOLIN HFA) 108 (90 Base) MCG/ACT inhaler Inhale 2 puffs into the lungs every 6 (six) hours as needed for wheezing or shortness of breath.  . ALPRAZolam (XANAX) 0.5  MG tablet Take 0.5-1 tablets (0.25-0.5 mg total) by mouth at bedtime as needed for sleep.  . AMBULATORY NON FORMULARY MEDICATION Continue CPAP at current settings.  Please provide new supplies. Micheal Likens phone number [phone]  . AMBULATORY NON FORMULARY MEDICATION Compression stockings.  Vive size LGM Leg swelling. Disp 1 pair 99 refil.  Marland Kitchen anastrozole (ARIMIDEX) 1 MG tablet Take 1 tablet by mouth once daily  . atorvastatin (LIPITOR) 40 MG tablet Take 1 tablet (40 mg total) by mouth daily.  . Azelaic Acid 15 % cream Apply 1 application topically 2 (two) times daily.  Marland Kitchen docusate sodium (COLACE) 50 MG capsule Take 50 mg by mouth daily.  . fluticasone (FLONASE) 50 MCG/ACT nasal spray Place into both nostrils daily.  Marland Kitchen loratadine (CLARITIN) 10 MG tablet Take 10 mg by mouth daily.  Marland Kitchen losartan (COZAAR) 50 MG tablet Take 1 tablet (50 mg total) by mouth daily.  . Misc. Devices (FLEX THERAPY) MISC by Does not apply route.  . Multiple Vitamin (MULTIVITAMIN WITH MINERALS) TABS tablet Take 1 tablet by mouth daily.  . Probiotic Product (PROBIOTIC PO) Take by mouth daily.  . tadalafil (CIALIS) 20 MG tablet TAKE HALF TO ONE TABLET BY MOUTH EVERY OTHER DAY AS NEEDED FOR ERECTILE DYSFUNCTION  . tamsulosin (FLOMAX) 0.4 MG CAPS capsule Take 1 capsule (0.4 mg total) by mouth daily after breakfast.  . VITAMIN D PO Take by mouth.   Facility-Administered Encounter Medications as of 12/28/2020  Medication  . potassium iodide (IOSAT) 130 MG tablet  . testosterone cypionate (DEPOTESTOSTERONE CYPIONATE) injection 200 mg  :  Review of Systems:  Out of a complete 14 point review of systems, all are reviewed and negative with the exception of these symptoms as listed below: Review of Systems  Neurological:       Here for f/u on DAT scan.    Objective:  Neurological Exam  Physical Exam Physical Examination:   Vitals:   12/28/20 1100  BP: 136/84  Pulse: 76    General Examination: The patient is  a very pleasant 70 y.o. male in no acute distress. He appears well-developed and well-nourished and well groomed.   HEENT: Normocephalic, atraumatic, pupils are equal, round and reactive to light, extraocular tracking is generally  preserved, mild decrease in eye blink rate noted and mild facial masking noted.  Mild nuchal rigidity noted, no lip, neck or jaw tremor, no carotid bruits.  Airway examination reveals mild mouth dryness, tongue protrudes centrally and palate elevates symmetrically.  Moderate airway crowding.  No significant hypophonia, no dysarthria noted.   Chest: Clear to auscultation without wheezing, rhonchi or crackles noted.  Heart: S1+S2+0, regular and normal without murmurs, rubs or gallops noted.   Abdomen: Soft, non-tender and non-distended with normal bowel sounds appreciated on auscultation.  Extremities: There is trace pitting edema in the right distal lower extremity, 1+ in the L leg, mostly around the ankle.  Left leg larger in caliber than right.    Skin: Warm and dry without trophic changes noted.   Musculoskeletal: exam reveals decreased range of motion in both shoulders, right shoulder is higher than left.  Scar left ankle and distal leg, scar right ankle.     Neurologically:  Mental status: The patient is awake, alert and oriented in all 4 spheres. His immediate and remote memory, attention, language skills and fund of knowledge are appropriate. There is no evidence of aphasia, agnosia, apraxia or anomia. Mood is normal and affect is normal.  Cranial nerves II - XII are as described above under HEENT exam.  Unequalshoulder height noted.   Motor exam: Normal bulk, mild increase in tone in the right more than left upper extremity, no telltale cogwheeling.  He has a slight and intermittent resting tremor in the right upper and right lower extremity.  He has no significant postural or action tremor. No left sided resting tremor.   (On 11/22/2020: On Archimedes  spiral drawing he has no  significant trembling with the right hand which is his dominant hand, insecurity noted with the left hand but no trembling as such, handwriting is on the smaller side, legible.)    Reflexes are diminished throughout. Fine motor skills shows mild to moderate impairment with finger taps on the right side, slightly better on the left, foot taps are mild to moderately impaired bilaterally.  Rapid alternating patting and hand movements are fairly well-preserved bilaterally.   Cerebellar testing: No dysmetria or intention tremor on finger to nose testing. Heel to shin is unremarkable bilaterally. There is no truncal or gait ataxia.  Sensory exam: intact to light touch in the upper and lower extremities.  Gait, station and balance: He stands with mild difficulty, pushes himself up, posture is age-appropriate to slightly stooped for age.  He walks with no telltale shuffling but decreased arm swing on the right.  He walks slightly slowly, turns slowly.   Assessment and Plan:   In summary, Trygg Mantz is a very pleasant 70 year old male with an underlying medical history of degenerative disc disease, hyperlipidemia, hypertension, diabetes, vitamin D deficiency, obstructive sleep apnea, arthritis, status post multiple surgeries allergies, low testosterone, and severe obesity with a BMI of over 40, who presents for evaluation of his parkinsonism.  His recent DaTscan from 12/26/2020 showed marked decreased activity within the bilateral posterior striatum, and was, as such supportive of an underlying parkinsonian syndrome.  His history and presentation are in keeping with right-sided parkinsonism, likely Parkinson's disease, no obvious atypical features at this time. I again had a long discussion with the patient about this, including his symptoms, my findings, the possible diagnosis and treatment options.  We talked about different approaches to treatment including the utilization of a  dopamine agonist versus levodopa.  We talked about the importance  of maintaining a healthy lifestyle, weight loss, maintaining good nutrition, good hydration, regular exercise and being proactive about constipation issues.  Of note, he has noted intermittent constipation for the past year and prior to that never had an issue with constipation.  I suggest that he start a trial of Neupro patch 2 mg strength once daily.  I explained this medication to him in detail, we talked about expectations and possible side effects, we also talked about impulse control disorder, also known as dopamine dysregulation.  He was given written instructions and provided a new prescription which he requested to be sent to [facility] mail order pharmacy even for a 30-day prescription.  He is advised to monitor for worsening of his lower extremity swelling which could be related to taking a dopamine agonist.  Alternatively, we may try an oral dopamine agonist but I would really like to favor the patch in his case.  We can also consider levodopa, i.e. generic Sinemet.  We will plan follow-up in about 3 months, sooner if needed.  He is encouraged to call or email [place] through [place] about a month or 3 weeks into using the 2 mg patch, at which time we will think about increasing to the 4 mg patch before his next appointment.  I answered all his questions today and he was in agreement with the plan.   I spent 41 minutes in total face-to-face time and in reviewing records during pre-charting, more than 50% of which was spent in counseling and coordination of care, reviewing test results, reviewing medications and treatment regimen and/or in discussing or reviewing the diagnosis of PD, the prognosis and treatment options. Pertinent laboratory and imaging test results that were available during this visit with the patient were reviewed by me and considered in my medical decision making (see chart for details).

## 2021-01-02 ENCOUNTER — Other Ambulatory Visit: Payer: Self-pay

## 2021-01-02 DIAGNOSIS — N400 Enlarged prostate without lower urinary tract symptoms: Secondary | ICD-10-CM

## 2021-01-02 MED ORDER — ANASTROZOLE 1 MG PO TABS: 1.0000 mg | ORAL_TABLET | Freq: Every day | ORAL | 2 refills | Status: DC

## 2021-01-04 ENCOUNTER — Ambulatory Visit (INDEPENDENT_AMBULATORY_CARE_PROVIDER_SITE_OTHER): Payer: Medicare HMO | Admitting: Family Medicine

## 2021-01-04 ENCOUNTER — Other Ambulatory Visit: Payer: Self-pay

## 2021-01-04 VITALS — BP 136/75 | HR 71 | Temp 98.0°F | Resp 20 | Ht 66.0 in | Wt 254.3 lb

## 2021-01-04 DIAGNOSIS — E291 Testicular hypofunction: Secondary | ICD-10-CM

## 2021-01-04 NOTE — Progress Notes (Signed)
Established Patient Office Visit  Subjective:  Patient ID: Jonathan Yu, male    DOB: [birth year]  Age: 70 y.o. MRN: [id number]  CC:  Chief Complaint  Patient presents with  . Hypogonadism    HPI Jonathan Yu presents for a testosterone injection. Denies chest pain, shortness of breath, headaches, and problems with medication or mood changes.    Patient tolerated testosterone 200 mg injection to RUOQ well without complications. Patient advised to schedule next injection in 3 weeks.   Past Medical History:  Diagnosis Date  . Allergy   . DDD (degenerative disc disease), lumbar   . Hyperlipidemia    "no meds since losing 106# 2 yr ago" (10/06/2013)  . Hypertension 2012   "no meds since losing 106# 2 yr ago" (10/06/2013)  . OSA on CPAP    "don't wear mask much since losing 106# 2 yr ago" (10/06/2013)  . Type II diabetes mellitus ([place])    "no meds since losing 106# 2 yr ago" (10/06/2013)  . Walking pneumonia 1968    Past Surgical History:  Procedure Laterality Date  . ANKLE FRACTURE SURGERY Left 1973  . ANKLE RECONSTRUCTION Right 05/2011  . CARPAL TUNNEL RELEASE Bilateral 2001  . HARDWARE REMOVAL Right 10/07/2013   Procedure: RIGHT ANKLE HARDWARE REMOVAL;  Surgeon: Newt Minion, MD;  Location: [facility];  Service: Orthopedics;  Laterality: Right;  . I & D EXTREMITY Right 11/18/2013   Procedure: IRRIGATION AND DEBRIDEMENT EXTREMITY;  Surgeon: Newt Minion, MD;  Location: [facility];  Service: Orthopedics;  Laterality: Right;  Irrigation and Debridement Right Fibula , Place Antibiotic Beads and VAC  . NASAL SEPTUM SURGERY  1982  . ORIF ANKLE FRACTURE Right 09/08/2013   Procedure: REPAIR SYNDESMOSIS DISRUPTION RIGHT ANKLE;  Surgeon: Newt Minion, MD;  Location: [facility];  Service: Orthopedics;  Laterality: Right;  . SHOULDER ARTHROSCOPY W/ ROTATOR CUFF REPAIR Right 2006; 2009  . TONSILLECTOMY  1959  . TOTAL ANKLE ARTHROPLASTY Right 08/18/2013   Procedure: TOTAL ANKLE ARTHOPLASTY;  Surgeon:  Newt Minion, MD;  Location: [facility];  Service: Orthopedics;  Laterality: Right;  Right Total Ankle Arthroplasty, Revision Fibular Fracture  . URETERAL STENT PLACEMENT  ~ 2010 X 2    Family History  Problem Relation Age of Onset  . Cancer Mother 71       Lung  . Alzheimer's disease Father   . Parkinson's disease Father   . Scoliosis Sister   . Cleft lip Sister     Social History   Socioeconomic History  . Marital status: Divorced    Spouse name: Not on file  . Number of children: 5  . Years of education: Not on file  . Highest education level: Not on file  Occupational History  . Occupation: sales/ DJ  Tobacco Use  . Smoking status: Current Some Day Smoker    Years: 6.00    Types: Pipe, Cigars  . Smokeless tobacco: Never Used  . Tobacco comment: 10/06/2013 "use pipe or cigar 1-2 times/month"  Substance and Sexual Activity  . Alcohol use: Yes    Comment: 10/06/2013 "have 4-5 drinks/yr"  . Drug use: No  . Sexual activity: Yes    Comment: 1 every 2 months  Other Topics Concern  . Not on file  Social History Narrative  . Not on file   Social Determinants of Health   Financial Resource Strain: Not on file  Food Insecurity: Not on file  Transportation Needs: Not on file  Physical  Activity: Not on file  Stress: Not on file  Social Connections: Not on file  Intimate Partner Violence: Not on file    Outpatient Medications Prior to Visit  Medication Sig Dispense Refill  . albuterol (PROVENTIL HFA;VENTOLIN HFA) 108 (90 Base) MCG/ACT inhaler Inhale 2 puffs into the lungs every 6 (six) hours as needed for wheezing or shortness of breath. 1 Inhaler 0  . ALPRAZolam (XANAX) 0.5 MG tablet Take 0.5-1 tablets (0.25-0.5 mg total) by mouth at bedtime as needed for sleep. 20 tablet 3  . AMBULATORY NON FORMULARY MEDICATION Continue CPAP at current settings.  Please provide new supplies. Micheal Likens phone number [phone] 1 each 0  . AMBULATORY NON FORMULARY MEDICATION  Compression stockings.  Vive size LGM Leg swelling. Disp 1 pair 99 refil. 1 each 99  . anastrozole (ARIMIDEX) 1 MG tablet Take 1 tablet (1 mg total) by mouth daily. 30 tablet 2  . atorvastatin (LIPITOR) 40 MG tablet Take 1 tablet (40 mg total) by mouth daily. 90 tablet 3  . Azelaic Acid 15 % cream Apply 1 application topically 2 (two) times daily. 50 g 12  . docusate sodium (COLACE) 50 MG capsule Take 50 mg by mouth daily.    . fluticasone (FLONASE) 50 MCG/ACT nasal spray Place into both nostrils daily.    Marland Kitchen loratadine (CLARITIN) 10 MG tablet Take 10 mg by mouth daily.    Marland Kitchen losartan (COZAAR) 50 MG tablet Take 1 tablet (50 mg total) by mouth daily. 90 tablet 3  . Misc. Devices (FLEX THERAPY) MISC by Does not apply route.    . Multiple Vitamin (MULTIVITAMIN WITH MINERALS) TABS tablet Take 1 tablet by mouth daily.    . Probiotic Product (PROBIOTIC PO) Take by mouth daily.    . rotigotine (NEUPRO) 2 MG/24HR Place 1 patch onto the skin daily. 30 patch 2  . tadalafil (CIALIS) 20 MG tablet TAKE HALF TO ONE TABLET BY MOUTH EVERY OTHER DAY AS NEEDED FOR ERECTILE DYSFUNCTION 30 tablet 12  . tamsulosin (FLOMAX) 0.4 MG CAPS capsule Take 1 capsule (0.4 mg total) by mouth daily after breakfast. 90 capsule 3  . VITAMIN D PO Take by mouth.     Facility-Administered Medications Prior to Visit  Medication Dose Route Frequency Provider Last Rate Last Admin  . testosterone cypionate (DEPOTESTOSTERONE CYPIONATE) injection 200 mg  200 mg Intramuscular Q21 days Luetta Nutting, DO   200 mg at 01/04/21 1040    Allergies  Allergen Reactions  . Sesame Oil Hives and Shortness Of Breath  . Clonazepam Hives  . Percocet [Oxycodone-Acetaminophen] Rash  . Topamax [Topiramate]     Cognitive decline  . Vicodin [Hydrocodone-Acetaminophen] Rash    ROS Review of Systems    Objective:    Physical Exam  BP 136/75 (BP Location: Left Arm, Patient Position: Sitting, Cuff Size: Large)   Pulse 71   Temp 98 F (36.7  C) (Oral)   Resp 20   Ht 5\' 6"  (1.676 m)   Wt 254 lb 4.8 oz (115.3 kg)   SpO2 96%   BMI 41.05 kg/m  Wt Readings from Last 3 Encounters:  01/04/21 254 lb 4.8 oz (115.3 kg)  12/28/20 258 lb (117 kg)  11/22/20 257 lb (116.6 kg)     Health Maintenance Due  Topic Date Due  . COVID-19 Vaccine (3 - Pfizer risk 4-dose series) 12/08/2019    There are no preventive care reminders to display for this patient.  Lab Results  Component Value Date  TSH 3.21 03/16/2020   Lab Results  Component Value Date   WBC 7.7 09/07/2020   HGB 18.1 (H) 09/07/2020   HCT 52.4 (H) 09/07/2020   MCV 89.3 09/07/2020   PLT 201 09/07/2020   Lab Results  Component Value Date   NA 137 03/16/2020   K 4.2 03/16/2020   CO2 29 03/16/2020   GLUCOSE 122 (H) 03/16/2020   BUN 12 03/16/2020   CREATININE 1.03 03/16/2020   BILITOT 2.2 (H) 03/16/2020   ALKPHOS 39 (L) 01/07/2017   AST 21 03/16/2020   ALT 30 03/16/2020   PROT 6.9 03/16/2020   ALBUMIN 3.5 (L) 01/07/2017   CALCIUM 9.3 03/16/2020   ANIONGAP 11 07/31/2019   Lab Results  Component Value Date   CHOL 197 03/16/2020   Lab Results  Component Value Date   HDL 35 (L) 03/16/2020   Lab Results  Component Value Date   LDLCALC 126 (H) 03/16/2020   Lab Results  Component Value Date   TRIG 224 (H) 03/16/2020   Lab Results  Component Value Date   CHOLHDL 5.6 (H) 03/16/2020   Lab Results  Component Value Date   HGBA1C 6.0 (H) 03/16/2020      Assessment & Plan:  Patient tolerated testosterone 200 mg injection to RUOQ well without complications. Patient advised to schedule next injection in 3 weeks.  Problem List Items Addressed This Visit      Endocrine   Hypogonadism in male - Primary      No orders of the defined types were placed in this encounter.   Follow-up: Return in about 3 months (around 04/06/2021) for Testosterone Injection.    Ninfa Meeker, CMA

## 2021-01-04 NOTE — Progress Notes (Signed)
Medical screening examination/treatment was performed by qualified clinical staff member and as supervising physician I was immediately available for consultation/collaboration. I have reviewed documentation and agree with assessment and plan.  Treonna Klee, DO  

## 2021-01-12 ENCOUNTER — Other Ambulatory Visit: Payer: Self-pay

## 2021-01-12 DIAGNOSIS — N400 Enlarged prostate without lower urinary tract symptoms: Secondary | ICD-10-CM

## 2021-01-12 MED ORDER — ANASTROZOLE 1 MG PO TABS: 1.0000 mg | ORAL_TABLET | Freq: Every day | ORAL | 2 refills | Status: DC

## 2021-01-24 ENCOUNTER — Other Ambulatory Visit: Payer: Self-pay

## 2021-01-24 ENCOUNTER — Encounter: Payer: Self-pay | Admitting: Neurology

## 2021-01-24 ENCOUNTER — Ambulatory Visit (INDEPENDENT_AMBULATORY_CARE_PROVIDER_SITE_OTHER): Payer: Medicare HMO | Admitting: Family Medicine

## 2021-01-24 VITALS — BP 140/68 | HR 86

## 2021-01-24 DIAGNOSIS — E291 Testicular hypofunction: Secondary | ICD-10-CM | POA: Diagnosis not present

## 2021-01-24 NOTE — Progress Notes (Signed)
Medical screening examination/treatment was performed by qualified clinical staff member and as supervising physician I was immediately available for consultation/collaboration. I have reviewed documentation and agree with assessment and plan.  Jamilynn Whitacre, DO  

## 2021-01-24 NOTE — Progress Notes (Signed)
Established Patient Office Visit  Subjective:  Patient ID: Jonathan Yu, male    DOB: Nov 14, 1950  Age: 70 y.o. MRN: [id number]  CC:  Chief Complaint  Patient presents with   Hypogonadism    HPI Jonathan Yu is here for a testosterone injection. Denies chest pain, shortness of breath, headaches or mood changes.    Past Medical History:  Diagnosis Date   Allergy    DDD (degenerative disc disease), lumbar    Hyperlipidemia    "no meds since losing 106# 2 yr ago" (10/06/2013)   Hypertension 2012   "no meds since losing 106# 2 yr ago" (10/06/2013)   OSA on CPAP    "don't wear mask much since losing 106# 2 yr ago" (10/06/2013)   Type II diabetes mellitus ([place])    "no meds since losing 106# 2 yr ago" (10/06/2013)   Walking pneumonia 1968    Past Surgical History:  Procedure Laterality Date   ANKLE FRACTURE SURGERY Left 1973   ANKLE RECONSTRUCTION Right 05/2011   CARPAL TUNNEL RELEASE Bilateral 2001   HARDWARE REMOVAL Right 10/07/2013   Procedure: RIGHT ANKLE HARDWARE REMOVAL;  Surgeon: Newt Minion, MD;  Location: [place];  Service: Orthopedics;  Laterality: Right;   I & D EXTREMITY Right 11/18/2013   Procedure: IRRIGATION AND DEBRIDEMENT EXTREMITY;  Surgeon: Newt Minion, MD;  Location: [place];  Service: Orthopedics;  Laterality: Right;  Irrigation and Debridement Right Fibula , Place Antibiotic Beads and VAC   NASAL SEPTUM SURGERY  1982   ORIF ANKLE FRACTURE Right 09/08/2013   Procedure: REPAIR SYNDESMOSIS DISRUPTION RIGHT ANKLE;  Surgeon: Newt Minion, MD;  Location: [place];  Service: Orthopedics;  Laterality: Right;   SHOULDER ARTHROSCOPY W/ ROTATOR CUFF REPAIR Right 2006; 2009   TONSILLECTOMY  1959   TOTAL ANKLE ARTHROPLASTY Right 08/18/2013   Procedure: TOTAL ANKLE ARTHOPLASTY;  Surgeon: Newt Minion, MD;  Location: [place];  Service: Orthopedics;  Laterality: Right;  Right Total Ankle Arthroplasty, Revision Fibular Fracture   URETERAL STENT PLACEMENT  ~ 2010 X 2    Family  History  Problem Relation Age of Onset   Cancer Mother 60       Lung   Alzheimer's disease Father    Parkinson's disease Father    Scoliosis Sister    Cleft lip Sister     Social History   Socioeconomic History   Marital status: Divorced    Spouse name: Not on file   Number of children: 5   Years of education: Not on file   Highest education level: Not on file  Occupational History   Occupation: sales/ DJ  Tobacco Use   Smoking status: Some Days    Pack years: 0.00    Types: Pipe, Cigars   Smokeless tobacco: Never   Tobacco comments:    10/06/2013 "use pipe or cigar 1-2 times/month"  Substance and Sexual Activity   Alcohol use: Yes    Comment: 10/06/2013 "have 4-5 drinks/yr"   Drug use: No   Sexual activity: Yes    Comment: 1 every 2 months  Other Topics Concern   Not on file  Social History Narrative   Not on file   Social Determinants of Health   Financial Resource Strain: Not on file  Food Insecurity: Not on file  Transportation Needs: Not on file  Physical Activity: Not on file  Stress: Not on file  Social Connections: Not on file  Intimate Partner Violence: Not on file  Outpatient Medications Prior to Visit  Medication Sig Dispense Refill   albuterol (PROVENTIL HFA;VENTOLIN HFA) 108 (90 Base) MCG/ACT inhaler Inhale 2 puffs into the lungs every 6 (six) hours as needed for wheezing or shortness of breath. 1 Inhaler 0   ALPRAZolam (XANAX) 0.5 MG tablet Take 0.5-1 tablets (0.25-0.5 mg total) by mouth at bedtime as needed for sleep. 20 tablet 3   AMBULATORY NON FORMULARY MEDICATION Continue CPAP at current settings.  Please provide new supplies. Micheal Likens phone number [phone] 1 each 0   AMBULATORY NON FORMULARY MEDICATION Compression stockings.  Vive size LGM Leg swelling. Disp 1 pair 99 refil. 1 each 99   anastrozole (ARIMIDEX) 1 MG tablet Take 1 tablet (1 mg total) by mouth daily. 30 tablet 2   atorvastatin (LIPITOR) 40 MG tablet Take 1 tablet  (40 mg total) by mouth daily. 90 tablet 3   Azelaic Acid 15 % cream Apply 1 application topically 2 (two) times daily. 50 g 12   docusate sodium (COLACE) 50 MG capsule Take 50 mg by mouth daily.     fluticasone (FLONASE) 50 MCG/ACT nasal spray Place into both nostrils daily.     loratadine (CLARITIN) 10 MG tablet Take 10 mg by mouth daily.     losartan (COZAAR) 50 MG tablet Take 1 tablet (50 mg total) by mouth daily. 90 tablet 3   Misc. Devices (FLEX THERAPY) MISC by Does not apply route.     Multiple Vitamin (MULTIVITAMIN WITH MINERALS) TABS tablet Take 1 tablet by mouth daily.     Probiotic Product (PROBIOTIC PO) Take by mouth daily.     rotigotine (NEUPRO) 2 MG/24HR Place 1 patch onto the skin daily. 30 patch 2   tadalafil (CIALIS) 20 MG tablet TAKE HALF TO ONE TABLET BY MOUTH EVERY OTHER DAY AS NEEDED FOR ERECTILE DYSFUNCTION 30 tablet 12   tamsulosin (FLOMAX) 0.4 MG CAPS capsule Take 1 capsule (0.4 mg total) by mouth daily after breakfast. 90 capsule 3   VITAMIN D PO Take by mouth.     Facility-Administered Medications Prior to Visit  Medication Dose Route Frequency Provider Last Rate Last Admin   testosterone cypionate (DEPOTESTOSTERONE CYPIONATE) injection 200 mg  200 mg Intramuscular Q21 days Luetta Nutting, DO   200 mg at 01/24/21 1454    Allergies  Allergen Reactions   Sesame Oil Hives and Shortness Of Breath   Clonazepam Hives   Percocet [Oxycodone-Acetaminophen] Rash   Topamax [Topiramate]     Cognitive decline   Vicodin [Hydrocodone-Acetaminophen] Rash    ROS Review of Systems    Objective:    Physical Exam  BP 140/68   Pulse 86   SpO2 97%  Wt Readings from Last 3 Encounters:  01/04/21 254 lb 4.8 oz (115.3 kg)  12/28/20 258 lb (117 kg)  11/22/20 257 lb (116.6 kg)     Health Maintenance Due  Topic Date Due   Zoster Vaccines- Shingrix (1 of 2) Never done   COVID-19 Vaccine (3 - Pfizer risk series) 12/08/2019    There are no preventive care reminders to  display for this patient.  Lab Results  Component Value Date   TSH 3.21 03/16/2020   Lab Results  Component Value Date   WBC 7.7 09/07/2020   HGB 18.1 (H) 09/07/2020   HCT 52.4 (H) 09/07/2020   MCV 89.3 09/07/2020   PLT 201 09/07/2020   Lab Results  Component Value Date   NA 137 03/16/2020   K 4.2 03/16/2020   CO2  29 03/16/2020   GLUCOSE 122 (H) 03/16/2020   BUN 12 03/16/2020   CREATININE 1.03 03/16/2020   BILITOT 2.2 (H) 03/16/2020   ALKPHOS 39 (L) 01/07/2017   AST 21 03/16/2020   ALT 30 03/16/2020   PROT 6.9 03/16/2020   ALBUMIN 3.5 (L) 01/07/2017   CALCIUM 9.3 03/16/2020   ANIONGAP 11 07/31/2019   Lab Results  Component Value Date   CHOL 197 03/16/2020   Lab Results  Component Value Date   HDL 35 (L) 03/16/2020   Lab Results  Component Value Date   LDLCALC 126 (H) 03/16/2020   Lab Results  Component Value Date   TRIG 224 (H) 03/16/2020   Lab Results  Component Value Date   CHOLHDL 5.6 (H) 03/16/2020   Lab Results  Component Value Date   HGBA1C 6.0 (H) 03/16/2020      Assessment & Plan:  Hypogonadism - Patient tolerated injection well without complications. Patient advised to schedule next injection 21 days from today.    Problem List Items Addressed This Visit     Hypogonadism in male - Primary    No orders of the defined types were placed in this encounter.   Follow-up: Return in about 3 weeks (around 02/14/2021) for Testosterone injection. Durene Romans, Monico Blitz, [place]

## 2021-01-25 ENCOUNTER — Ambulatory Visit: Payer: Medicare HMO

## 2021-01-25 MED ORDER — ROTIGOTINE 2 MG/24HR TD PT24: 1.0000 | MEDICATED_PATCH | Freq: Every day | TRANSDERMAL | 1 refills | Status: DC

## 2021-01-29 ENCOUNTER — Ambulatory Visit (INDEPENDENT_AMBULATORY_CARE_PROVIDER_SITE_OTHER): Payer: Medicare HMO | Admitting: Family Medicine

## 2021-01-29 ENCOUNTER — Ambulatory Visit (INDEPENDENT_AMBULATORY_CARE_PROVIDER_SITE_OTHER): Payer: Medicare HMO

## 2021-01-29 ENCOUNTER — Encounter: Payer: Self-pay | Admitting: Family Medicine

## 2021-01-29 ENCOUNTER — Other Ambulatory Visit: Payer: Self-pay

## 2021-01-29 VITALS — BP 134/78 | HR 81 | Ht 66.0 in | Wt 261.4 lb

## 2021-01-29 DIAGNOSIS — M7989 Other specified soft tissue disorders: Secondary | ICD-10-CM

## 2021-01-29 DIAGNOSIS — G20C Parkinsonism, unspecified: Secondary | ICD-10-CM | POA: Insufficient documentation

## 2021-01-29 DIAGNOSIS — G20 Parkinson's disease: Secondary | ICD-10-CM | POA: Diagnosis not present

## 2021-01-29 DIAGNOSIS — E291 Testicular hypofunction: Secondary | ICD-10-CM | POA: Diagnosis not present

## 2021-01-29 DIAGNOSIS — M79662 Pain in left lower leg: Secondary | ICD-10-CM | POA: Diagnosis not present

## 2021-01-29 DIAGNOSIS — R600 Localized edema: Secondary | ICD-10-CM | POA: Diagnosis not present

## 2021-01-29 NOTE — Progress Notes (Signed)
Jonathan Yu - 70 y.o. male MRN [id number]  Date of birth: Dec 24, 1950  Subjective Chief Complaint  Patient presents with   Leg Swelling    HPI Jonathan Yu is a 70 y.o. male here today for follow up visit.   He also has concern of L leg swelling.  He has had increased swelling in L leg for several weeks.  He has had some pain over the past week.  He denies injury to the leg.  He has had DVT previously.   He was also recently diagnosed with parkinson's.  He is seeing neurology and has recently been started on neupro patch.  He has follow up with neurology in a few days.    He is also taking testosterone for hypogonadism.  Anastrazole was added previously for elevated estradiol levels.  He was also feeling more emotional about things.  This has helped some but he still feels overly emotional about some things.    ROS:  A comprehensive ROS was completed and negative except as noted per HPI  Allergies  Allergen Reactions   Sesame Oil Hives and Shortness Of Breath   Clonazepam Hives   Percocet [Oxycodone-Acetaminophen] Rash   Topamax [Topiramate]     Cognitive decline   Vicodin [Hydrocodone-Acetaminophen] Rash    Past Medical History:  Diagnosis Date   Allergy    DDD (degenerative disc disease), lumbar    Hyperlipidemia    "no meds since losing 106# 2 yr ago" (10/06/2013)   Hypertension 2012   "no meds since losing 106# 2 yr ago" (10/06/2013)   OSA on CPAP    "don't wear mask much since losing 106# 2 yr ago" (10/06/2013)   Type II diabetes mellitus ([place])    "no meds since losing 106# 2 yr ago" (10/06/2013)   Walking pneumonia 1968    Past Surgical History:  Procedure Laterality Date   ANKLE FRACTURE SURGERY Left 1973   ANKLE RECONSTRUCTION Right 05/2011   CARPAL TUNNEL RELEASE Bilateral 2001   HARDWARE REMOVAL Right 10/07/2013   Procedure: RIGHT ANKLE HARDWARE REMOVAL;  Surgeon: Newt Minion, MD;  Location: [place];  Service: Orthopedics;  Laterality: Right;   I & D  EXTREMITY Right 11/18/2013   Procedure: IRRIGATION AND DEBRIDEMENT EXTREMITY;  Surgeon: Newt Minion, MD;  Location: [place];  Service: Orthopedics;  Laterality: Right;  Irrigation and Debridement Right Fibula , Place Antibiotic Beads and VAC   NASAL SEPTUM SURGERY  1982   ORIF ANKLE FRACTURE Right 09/08/2013   Procedure: REPAIR SYNDESMOSIS DISRUPTION RIGHT ANKLE;  Surgeon: Newt Minion, MD;  Location: [place];  Service: Orthopedics;  Laterality: Right;   SHOULDER ARTHROSCOPY W/ ROTATOR CUFF REPAIR Right 2006; 2009   TONSILLECTOMY  1959   TOTAL ANKLE ARTHROPLASTY Right 08/18/2013   Procedure: TOTAL ANKLE ARTHOPLASTY;  Surgeon: Newt Minion, MD;  Location: [place];  Service: Orthopedics;  Laterality: Right;  Right Total Ankle Arthroplasty, Revision Fibular Fracture   URETERAL STENT PLACEMENT  ~ 2010 X 2    Social History   Socioeconomic History   Marital status: Divorced    Spouse name: Not on file   Number of children: 5   Years of education: Not on file   Highest education level: Not on file  Occupational History   Occupation: sales/ DJ  Tobacco Use   Smoking status: Some Days    Pack years: 0.00    Types: Pipe, Cigars   Smokeless tobacco: Never   Tobacco comments:  10/06/2013 "use pipe or cigar 1-2 times/month"  Substance and Sexual Activity   Alcohol use: Yes    Comment: 10/06/2013 "have 4-5 drinks/yr"   Drug use: No   Sexual activity: Yes    Comment: 1 every 2 months  Other Topics Concern   Not on file  Social History Narrative   Not on file   Social Determinants of Health   Financial Resource Strain: Not on file  Food Insecurity: Not on file  Transportation Needs: Not on file  Physical Activity: Not on file  Stress: Not on file  Social Connections: Not on file    Family History  Problem Relation Age of Onset   Cancer Mother 25       Lung   Alzheimer's disease Father    Parkinson's disease Father    Scoliosis Sister    Cleft lip Sister     Health Maintenance   Topic Date Due   Zoster Vaccines- Shingrix (1 of 2) Never done   COVID-19 Vaccine (3 - Pfizer risk series) 12/08/2019   INFLUENZA VACCINE  03/12/2021   TETANUS/TDAP  04/24/2021   COLONOSCOPY (Pts 45-40yrs Insurance coverage will need to be confirmed)  06/03/2029   Hepatitis C Screening  Completed   PNA vac Low Risk Adult  Completed   HPV VACCINES  Aged Out     ----------------------------------------------------------------------------------------------------------------------------------------------------------------------------------------------------------------- Physical Exam BP 134/78 (BP Location: Left Arm, Patient Position: Sitting, Cuff Size: Large)   Pulse 81   Ht 5\' 6"  (1.676 m)   Wt 261 lb 6.4 oz (118.6 kg)   SpO2 95%   BMI 42.19 kg/m   Physical Exam Constitutional:      Appearance: Normal appearance.  HENT:     Head: Normocephalic and atraumatic.  Eyes:     General: No scleral icterus. Cardiovascular:     Rate and Rhythm: Normal rate and regular rhythm.  Pulmonary:     Effort: Pulmonary effort is normal.     Breath sounds: Normal breath sounds.  Musculoskeletal:     Cervical back: Neck supple.     Comments: Bilateral edema L>R.  TTP along posterior L calf.    Neurological:     Mental Status: He is alert.  Psychiatric:        Mood and Affect: Mood normal.    ------------------------------------------------------------------------------------------------------------------------------------------------------------------------------------------------------------------- Assessment and Plan  Hypogonadism in male Some improvement with addition of anastrazole.  Update estradiol and testosterone levels.   Left leg swelling History of venous statis but now with worsening swelling in L leg and some pain.  History of DVT as well.  Stat venous doppler ordered.    Parkinsonism [facility]) Managed by neurology.  Improved with Neupro patch.    No orders of the defined  types were placed in this encounter.   No follow-ups on file.    This visit occurred during the SARS-CoV-2 public health emergency.  Safety protocols were in place, including screening questions prior to the visit, additional usage of staff PPE, and extensive cleaning of exam room while observing appropriate contact time as indicated for disinfecting solutions.

## 2021-01-29 NOTE — Assessment & Plan Note (Signed)
History of venous statis but now with worsening swelling in L leg and some pain.  History of DVT as well.  Stat venous doppler ordered.

## 2021-01-29 NOTE — Assessment & Plan Note (Signed)
Managed by neurology.  Improved with Neupro patch.

## 2021-01-29 NOTE — Assessment & Plan Note (Signed)
Some improvement with addition of anastrazole.  Update estradiol and testosterone levels.

## 2021-01-30 LAB — ESTRADIOL: Estradiol: <15 pg/mL (ref <=39)

## 2021-01-30 LAB — TESTOSTERONE: Testosterone: 812 ng/dL (ref 250–827)

## 2021-02-01 ENCOUNTER — Telehealth: Payer: Self-pay

## 2021-02-01 MED ORDER — NEUPRO 4 MG/24HR TD PT24: 1.0000 | MEDICATED_PATCH | Freq: Every day | TRANSDERMAL | 1 refills | Status: DC

## 2021-02-01 NOTE — Telephone Encounter (Signed)
Pt called today and reports good benefit from Neupro patch and is tolerating well.  Per office note from 12/28/20 ok to increase to 4 mg if pt tolerates medication well.   Pt requested 4 mg be sent to [facility]- reports he has 2-3 days left the 2 mg.  I called pharmacy and spoke with Pharmacist Abby and provided the verbal expedited over for over night shipment of the medication.  Pt understands s/e for hte 4 mg  Common side effects reported are: Sedation, sleepiness, nausea, vomiting, and rare side effects are confusion, hallucinations, swelling in legs, and abnormal behaviors, including impulse control problems, which can manifest as excessive eating, obsessions with food or gambling, or hypersexuality.  And will CB if he has any issues.

## 2021-02-01 NOTE — Telephone Encounter (Signed)
Noted, thank you

## 2021-02-07 ENCOUNTER — Telehealth: Payer: Self-pay

## 2021-02-07 ENCOUNTER — Telehealth: Payer: Self-pay | Admitting: Neurology

## 2021-02-07 MED ORDER — NEUPRO 1 MG/24HR TD PT24
MEDICATED_PATCH | TRANSDERMAL | 0 refills | Status: DC
Start: 2021-02-07 — End: 2021-04-19

## 2021-02-07 NOTE — Telephone Encounter (Signed)
See mychart message from 02/07/21.

## 2021-02-07 NOTE — Telephone Encounter (Signed)
Bridging with a brand new medication is not a good option and may not be a good idea as he can have side effects with any new medication.  Xanax is not recommended as treatment for Parkinson's disease but if it helps with his anxiety, he can talk to his primary care about this. I think his best option is to try to call around and see if he finds a prescription pharmacy that has any lower strength of Neupro available.

## 2021-02-07 NOTE — Telephone Encounter (Signed)
Pt wanted to know if any alternative medications could be helpful until he gets his supply in. Pt reports he is not doing well and is nervous to wait until his medication comes in. Wanted to know if he could continue to take the xanax or increase the dosage? Pt is rx'd 0.5-1 tablet PRN at bedtime.  Pt is willing to try anything at this point.

## 2021-02-07 NOTE — Telephone Encounter (Signed)
I called pt. No answer, left a message asking pt to call me back.   

## 2021-02-07 NOTE — Telephone Encounter (Signed)
Pt is requesting a call as soon as possible.

## 2021-02-07 NOTE — Telephone Encounter (Signed)
See telephone note from 02/07/21, new encounter opened for this.

## 2021-02-07 NOTE — Addendum Note (Signed)
Addended by: Verlin Grills on: 02/07/2021 05:13 PM   Modules accepted: Orders

## 2021-02-07 NOTE — Telephone Encounter (Signed)
I really do not have any good solution for this.  If he can call his pharmacy or other pharmacies if they have a 2 mg patch or even the 1 mg patch, maybe we can bridge with a prescription.

## 2021-02-07 NOTE — Telephone Encounter (Signed)
Pt called to reported his Neupro 4mg  has delayed in shipment and he has been with out his patch since 02/03/21. Pt sts he has been having a hard time the last few days. Sts he has been experiencing tremors at night, increased stiffness, and low grade fever.  Pt is scheduled to receive his medication any time between tomorrow and Saturday but a definitive delivery date has not been given.  Pt sts he took a xanax last night to help him rest and it helped some.  Pt wanted to know what he could do in interim until his neupro patch come through? Several local pharmacies have been contacted and have told the pt medication would have to be ordered in.

## 2021-02-07 NOTE — Telephone Encounter (Signed)
Pt called stating that he is needing to speak to the RN as soon as possible. Pt states he is in a lot of pain. Please advise.

## 2021-02-07 NOTE — Telephone Encounter (Signed)
Pt also had sent a my chart message. I have sent my chart message to him as well.

## 2021-02-15 ENCOUNTER — Ambulatory Visit (INDEPENDENT_AMBULATORY_CARE_PROVIDER_SITE_OTHER): Payer: Medicare HMO | Admitting: Family Medicine

## 2021-02-15 ENCOUNTER — Other Ambulatory Visit: Payer: Self-pay

## 2021-02-15 VITALS — BP 140/80 | HR 82 | Wt 257.0 lb

## 2021-02-15 DIAGNOSIS — E291 Testicular hypofunction: Secondary | ICD-10-CM | POA: Diagnosis not present

## 2021-02-15 MED ORDER — TESTOSTERONE CYPIONATE 200 MG/ML IM SOLN
200.0000 mg | Freq: Once | INTRAMUSCULAR | Status: AC
  Administered 2021-02-15: 200 mg via INTRAMUSCULAR

## 2021-02-15 NOTE — Progress Notes (Signed)
Established Patient Office Visit  Subjective:  Patient ID: Jonathan Yu, male    DOB: [birth year]  Age: 70 y.o. MRN: [id number]  CC:  Chief Complaint  Patient presents with   Hypogonadism    HPI Joyce Leckey is here for a testosterone injection. Denies chest pain, shortness of breath, headaches or mood changes.    Past Medical History:  Diagnosis Date   Allergy    DDD (degenerative disc disease), lumbar    Hyperlipidemia    "no meds since losing 106# 2 yr ago" (10/06/2013)   Hypertension 2012   "no meds since losing 106# 2 yr ago" (10/06/2013)   OSA on CPAP    "don't wear mask much since losing 106# 2 yr ago" (10/06/2013)   Type II diabetes mellitus ([place])    "no meds since losing 106# 2 yr ago" (10/06/2013)   Walking pneumonia 1968    Past Surgical History:  Procedure Laterality Date   ANKLE FRACTURE SURGERY Left 1973   ANKLE RECONSTRUCTION Right 05/2011   CARPAL TUNNEL RELEASE Bilateral 2001   HARDWARE REMOVAL Right 10/07/2013   Procedure: RIGHT ANKLE HARDWARE REMOVAL;  Surgeon: Newt Minion, MD;  Location: [facility];  Service: Orthopedics;  Laterality: Right;   I & D EXTREMITY Right 11/18/2013   Procedure: IRRIGATION AND DEBRIDEMENT EXTREMITY;  Surgeon: Newt Minion, MD;  Location: [place];  Service: Orthopedics;  Laterality: Right;  Irrigation and Debridement Right Fibula , Place Antibiotic Beads and VAC   NASAL SEPTUM SURGERY  1982   ORIF ANKLE FRACTURE Right 09/08/2013   Procedure: REPAIR SYNDESMOSIS DISRUPTION RIGHT ANKLE;  Surgeon: Newt Minion, MD;  Location: [place];  Service: Orthopedics;  Laterality: Right;   SHOULDER ARTHROSCOPY W/ ROTATOR CUFF REPAIR Right 2006; 2009   TONSILLECTOMY  1959   TOTAL ANKLE ARTHROPLASTY Right 08/18/2013   Procedure: TOTAL ANKLE ARTHOPLASTY;  Surgeon: Newt Minion, MD;  Location: [place];  Service: Orthopedics;  Laterality: Right;  Right Total Ankle Arthroplasty, Revision Fibular Fracture   URETERAL STENT PLACEMENT  ~ 2010 X 2    Family  History  Problem Relation Age of Onset   Cancer Mother 68       Lung   Alzheimer's disease Father    Parkinson's disease Father    Scoliosis Sister    Cleft lip Sister     Social History   Socioeconomic History   Marital status: Divorced    Spouse name: Not on file   Number of children: 5   Years of education: Not on file   Highest education level: Not on file  Occupational History   Occupation: sales/ DJ  Tobacco Use   Smoking status: Some Days    Pack years: 0.00    Types: Pipe, Cigars   Smokeless tobacco: Never   Tobacco comments:    10/06/2013 "use pipe or cigar 1-2 times/month"  Substance and Sexual Activity   Alcohol use: Yes    Comment: 10/06/2013 "have 4-5 drinks/yr"   Drug use: No   Sexual activity: Yes    Comment: 1 every 2 months  Other Topics Concern   Not on file  Social History Narrative   Not on file   Social Determinants of Health   Financial Resource Strain: Not on file  Food Insecurity: Not on file  Transportation Needs: Not on file  Physical Activity: Not on file  Stress: Not on file  Social Connections: Not on file  Intimate Partner Violence: Not on file  Outpatient Medications Prior to Visit  Medication Sig Dispense Refill   albuterol (PROVENTIL HFA;VENTOLIN HFA) 108 (90 Base) MCG/ACT inhaler Inhale 2 puffs into the lungs every 6 (six) hours as needed for wheezing or shortness of breath. 1 Inhaler 0   ALPRAZolam (XANAX) 0.5 MG tablet Take 0.5-1 tablets (0.25-0.5 mg total) by mouth at bedtime as needed for sleep. 20 tablet 3   AMBULATORY NON FORMULARY MEDICATION Continue CPAP at current settings.  Please provide new supplies. Micheal Likens phone number [phone] 1 each 0   AMBULATORY NON FORMULARY MEDICATION Compression stockings.  Vive size LGM Leg swelling. Disp 1 pair 99 refil. 1 each 99   anastrozole (ARIMIDEX) 1 MG tablet Take 1 tablet (1 mg total) by mouth daily. 30 tablet 2   atorvastatin (LIPITOR) 40 MG tablet Take 1 tablet  (40 mg total) by mouth daily. 90 tablet 3   Azelaic Acid 15 % cream Apply 1 application topically 2 (two) times daily. 50 g 12   docusate sodium (COLACE) 50 MG capsule Take 50 mg by mouth daily.     fluticasone (FLONASE) 50 MCG/ACT nasal spray Place into both nostrils daily.     loratadine (CLARITIN) 10 MG tablet Take 10 mg by mouth daily.     losartan (COZAAR) 50 MG tablet Take 1 tablet (50 mg total) by mouth daily. 90 tablet 3   Misc. Devices (FLEX THERAPY) MISC by Does not apply route.     Multiple Vitamin (MULTIVITAMIN WITH MINERALS) TABS tablet Take 1 tablet by mouth daily.     Probiotic Product (PROBIOTIC PO) Take by mouth daily.     Rotigotine (NEUPRO) 1 MG/24HR PT24 2 patches daily 14 patch 0   rotigotine (NEUPRO) 4 MG/24HR Place 1 patch onto the skin daily. 90 patch 1   tadalafil (CIALIS) 20 MG tablet TAKE HALF TO ONE TABLET BY MOUTH EVERY OTHER DAY AS NEEDED FOR ERECTILE DYSFUNCTION 30 tablet 12   tamsulosin (FLOMAX) 0.4 MG CAPS capsule Take 1 capsule (0.4 mg total) by mouth daily after breakfast. 90 capsule 3   VITAMIN D PO Take by mouth.     Facility-Administered Medications Prior to Visit  Medication Dose Route Frequency Provider Last Rate Last Admin   testosterone cypionate (DEPOTESTOSTERONE CYPIONATE) injection 200 mg  200 mg Intramuscular Q21 days Luetta Nutting, DO   200 mg at 01/24/21 1454    Allergies  Allergen Reactions   Sesame Oil Hives and Shortness Of Breath   Clonazepam Hives   Percocet [Oxycodone-Acetaminophen] Rash   Topamax [Topiramate]     Cognitive decline   Vicodin [Hydrocodone-Acetaminophen] Rash    ROS Review of Systems    Objective:    Physical Exam  BP 140/80   Pulse 82   Wt 257 lb (116.6 kg)   SpO2 98%   BMI 41.48 kg/m  Wt Readings from Last 3 Encounters:  02/15/21 257 lb (116.6 kg)  01/29/21 261 lb 6.4 oz (118.6 kg)  01/04/21 254 lb 4.8 oz (115.3 kg)     Health Maintenance Due  Topic Date Due   Zoster Vaccines- Shingrix (1 of  2) Never done   COVID-19 Vaccine (3 - Pfizer risk series) 12/08/2019    There are no preventive care reminders to display for this patient.  Lab Results  Component Value Date   TSH 3.21 03/16/2020   Lab Results  Component Value Date   WBC 7.7 09/07/2020   HGB 18.1 (H) 09/07/2020   HCT 52.4 (H) 09/07/2020   MCV 89.3  09/07/2020   PLT 201 09/07/2020   Lab Results  Component Value Date   NA 137 03/16/2020   K 4.2 03/16/2020   CO2 29 03/16/2020   GLUCOSE 122 (H) 03/16/2020   BUN 12 03/16/2020   CREATININE 1.03 03/16/2020   BILITOT 2.2 (H) 03/16/2020   ALKPHOS 39 (L) 01/07/2017   AST 21 03/16/2020   ALT 30 03/16/2020   PROT 6.9 03/16/2020   ALBUMIN 3.5 (L) 01/07/2017   CALCIUM 9.3 03/16/2020   ANIONGAP 11 07/31/2019   Lab Results  Component Value Date   CHOL 197 03/16/2020   Lab Results  Component Value Date   HDL 35 (L) 03/16/2020   Lab Results  Component Value Date   LDLCALC 126 (H) 03/16/2020   Lab Results  Component Value Date   TRIG 224 (H) 03/16/2020   Lab Results  Component Value Date   CHOLHDL 5.6 (H) 03/16/2020   Lab Results  Component Value Date   HGBA1C 6.0 (H) 03/16/2020      Assessment & Plan:  Hypogonadism - Patient tolerated injection well without complications. Patient advised to schedule next injection 21 days from today.    Problem List Items Addressed This Visit     Hypogonadism in male - Primary    Meds ordered this encounter  Medications   testosterone cypionate (DEPOTESTOSTERONE CYPIONATE) injection 200 mg    Follow-up: Return in about 3 weeks (around 03/08/2021) for testosterone injection. Durene Romans, Monico Blitz, [place]

## 2021-02-15 NOTE — Progress Notes (Signed)
Medical screening examination/treatment was performed by qualified clinical staff member and as supervising physician I was immediately available for consultation/collaboration. I have reviewed documentation and agree with assessment and plan.  Zared Knoth, DO  

## 2021-03-08 ENCOUNTER — Other Ambulatory Visit: Payer: Self-pay | Admitting: Family Medicine

## 2021-03-08 ENCOUNTER — Other Ambulatory Visit: Payer: Self-pay

## 2021-03-08 ENCOUNTER — Ambulatory Visit (INDEPENDENT_AMBULATORY_CARE_PROVIDER_SITE_OTHER): Payer: Medicare HMO | Admitting: Family Medicine

## 2021-03-08 VITALS — BP 144/80 | HR 75

## 2021-03-08 DIAGNOSIS — E291 Testicular hypofunction: Secondary | ICD-10-CM | POA: Diagnosis not present

## 2021-03-08 DIAGNOSIS — N400 Enlarged prostate without lower urinary tract symptoms: Secondary | ICD-10-CM

## 2021-03-08 NOTE — Progress Notes (Signed)
Established Patient Office Visit  Subjective:  Patient ID: Jonathan Yu, male    DOB: 05-29-1951  Age: 70 y.o. MRN: [id number]  CC:  Chief Complaint  Patient presents with   Hypogonadism    HPI [name] presents for Hypogonadism.  Past Medical History:  Diagnosis Date   Allergy    DDD (degenerative disc disease), lumbar    Hyperlipidemia    "no meds since losing 106# 2 yr ago" (10/06/2013)   Hypertension 2012   "no meds since losing 106# 2 yr ago" (10/06/2013)   OSA on CPAP    "don't wear mask much since losing 106# 2 yr ago" (10/06/2013)   Type II diabetes mellitus ([place])    "no meds since losing 106# 2 yr ago" (10/06/2013)   Walking pneumonia 1968    Past Surgical History:  Procedure Laterality Date   ANKLE FRACTURE SURGERY Left 1973   ANKLE RECONSTRUCTION Right 05/2011   CARPAL TUNNEL RELEASE Bilateral 2001   HARDWARE REMOVAL Right 10/07/2013   Procedure: RIGHT ANKLE HARDWARE REMOVAL;  Surgeon: Newt Minion, MD;  Location: [facility];  Service: Orthopedics;  Laterality: Right;   I & D EXTREMITY Right 11/18/2013   Procedure: IRRIGATION AND DEBRIDEMENT EXTREMITY;  Surgeon: Newt Minion, MD;  Location: [place];  Service: Orthopedics;  Laterality: Right;  Irrigation and Debridement Right Fibula , Place Antibiotic Beads and VAC   NASAL SEPTUM SURGERY  1982   ORIF ANKLE FRACTURE Right 09/08/2013   Procedure: REPAIR SYNDESMOSIS DISRUPTION RIGHT ANKLE;  Surgeon: Newt Minion, MD;  Location: [place];  Service: Orthopedics;  Laterality: Right;   SHOULDER ARTHROSCOPY W/ ROTATOR CUFF REPAIR Right 2006; 2009   TONSILLECTOMY  1959   TOTAL ANKLE ARTHROPLASTY Right 08/18/2013   Procedure: TOTAL ANKLE ARTHOPLASTY;  Surgeon: Newt Minion, MD;  Location: [facility];  Service: Orthopedics;  Laterality: Right;  Right Total Ankle Arthroplasty, Revision Fibular Fracture   URETERAL STENT PLACEMENT  ~ 2010 X 2    Family History  Problem Relation Age of Onset   Cancer Mother 35       Lung    Alzheimer's disease Father    Parkinson's disease Father    Scoliosis Sister    Cleft lip Sister     Social History   Socioeconomic History   Marital status: Divorced    Spouse name: Not on file   Number of children: 5   Years of education: Not on file   Highest education level: Not on file  Occupational History   Occupation: sales/ DJ  Tobacco Use   Smoking status: Some Days    Types: Pipe, Cigars   Smokeless tobacco: Never   Tobacco comments:    10/06/2013 "use pipe or cigar 1-2 times/month"  Substance and Sexual Activity   Alcohol use: Yes    Comment: 10/06/2013 "have 4-5 drinks/yr"   Drug use: No   Sexual activity: Yes    Comment: 1 every 2 months  Other Topics Concern   Not on file  Social History Narrative   Not on file   Social Determinants of Health   Financial Resource Strain: Not on file  Food Insecurity: Not on file  Transportation Needs: Not on file  Physical Activity: Not on file  Stress: Not on file  Social Connections: Not on file  Intimate Partner Violence: Not on file    Outpatient Medications Prior to Visit  Medication Sig Dispense Refill   albuterol (PROVENTIL HFA;VENTOLIN HFA) 108 (90 Base) MCG/ACT  inhaler Inhale 2 puffs into the lungs every 6 (six) hours as needed for wheezing or shortness of breath. 1 Inhaler 0   ALPRAZolam (XANAX) 0.5 MG tablet Take 0.5-1 tablets (0.25-0.5 mg total) by mouth at bedtime as needed for sleep. 20 tablet 3   AMBULATORY NON FORMULARY MEDICATION Continue CPAP at current settings.  Please provide new supplies. Micheal Likens phone number [phone] 1 each 0   AMBULATORY NON FORMULARY MEDICATION Compression stockings.  Vive size LGM Leg swelling. Disp 1 pair 99 refil. 1 each 99   anastrozole (ARIMIDEX) 1 MG tablet Take 1 tablet (1 mg total) by mouth daily. 30 tablet 2   atorvastatin (LIPITOR) 40 MG tablet Take 1 tablet (40 mg total) by mouth daily. 90 tablet 3   Azelaic Acid 15 % cream Apply 1 application  topically 2 (two) times daily. 50 g 12   docusate sodium (COLACE) 50 MG capsule Take 50 mg by mouth daily.     fluticasone (FLONASE) 50 MCG/ACT nasal spray Place into both nostrils daily.     loratadine (CLARITIN) 10 MG tablet Take 10 mg by mouth daily.     losartan (COZAAR) 50 MG tablet Take 1 tablet (50 mg total) by mouth daily. 90 tablet 3   Misc. Devices (FLEX THERAPY) MISC by Does not apply route.     Multiple Vitamin (MULTIVITAMIN WITH MINERALS) TABS tablet Take 1 tablet by mouth daily.     Probiotic Product (PROBIOTIC PO) Take by mouth daily.     Rotigotine (NEUPRO) 1 MG/24HR PT24 2 patches daily 14 patch 0   rotigotine (NEUPRO) 4 MG/24HR Place 1 patch onto the skin daily. 90 patch 1   tadalafil (CIALIS) 20 MG tablet TAKE HALF TO ONE TABLET BY MOUTH EVERY OTHER DAY AS NEEDED FOR ERECTILE DYSFUNCTION 30 tablet 12   tamsulosin (FLOMAX) 0.4 MG CAPS capsule Take 1 capsule (0.4 mg total) by mouth daily after breakfast. 90 capsule 3   VITAMIN D PO Take by mouth.     Facility-Administered Medications Prior to Visit  Medication Dose Route Frequency Provider Last Rate Last Admin   testosterone cypionate (DEPOTESTOSTERONE CYPIONATE) injection 200 mg  200 mg Intramuscular Q21 days Luetta Nutting, DO   200 mg at 01/24/21 1454    Allergies  Allergen Reactions   Sesame Oil Hives and Shortness Of Breath   Clonazepam Hives   Percocet [Oxycodone-Acetaminophen] Rash   Topamax [Topiramate]     Cognitive decline   Vicodin [Hydrocodone-Acetaminophen] Rash    ROS Review of Systems    Objective:    Physical Exam  BP (!) 144/80 (BP Location: Right Arm, Patient Position: Sitting, Cuff Size: Large)   Pulse 75   SpO2 95%  Wt Readings from Last 3 Encounters:  02/15/21 257 lb (116.6 kg)  01/29/21 261 lb 6.4 oz (118.6 kg)  01/04/21 254 lb 4.8 oz (115.3 kg)     Health Maintenance Due  Topic Date Due   Zoster Vaccines- Shingrix (1 of 2) Never done   COVID-19 Vaccine (3 - Pfizer risk series)  12/08/2019    There are no preventive care reminders to display for this patient.  Lab Results  Component Value Date   TSH 3.21 03/16/2020   Lab Results  Component Value Date   WBC 7.7 09/07/2020   HGB 18.1 (H) 09/07/2020   HCT 52.4 (H) 09/07/2020   MCV 89.3 09/07/2020   PLT 201 09/07/2020   Lab Results  Component Value Date   NA 137 03/16/2020  K 4.2 03/16/2020   CO2 29 03/16/2020   GLUCOSE 122 (H) 03/16/2020   BUN 12 03/16/2020   CREATININE 1.03 03/16/2020   BILITOT 2.2 (H) 03/16/2020   ALKPHOS 39 (L) 01/07/2017   AST 21 03/16/2020   ALT 30 03/16/2020   PROT 6.9 03/16/2020   ALBUMIN 3.5 (L) 01/07/2017   CALCIUM 9.3 03/16/2020   ANIONGAP 11 07/31/2019   Lab Results  Component Value Date   CHOL 197 03/16/2020   Lab Results  Component Value Date   HDL 35 (L) 03/16/2020   Lab Results  Component Value Date   LDLCALC 126 (H) 03/16/2020   Lab Results  Component Value Date   TRIG 224 (H) 03/16/2020   Lab Results  Component Value Date   CHOLHDL 5.6 (H) 03/16/2020   Lab Results  Component Value Date   HGBA1C 6.0 (H) 03/16/2020      Assessment & Plan:   Patient came in today for testosterone injection. He denied shortness of breath and chest pain. Injection was given in right upper outer quadrant.He will follow up in 21 days for next injection.   Problem List Items Addressed This Visit   None   No orders of the defined types were placed in this encounter.   Follow-up: Return in about 3 weeks (around 03/29/2021).    Gust Brooms, CMA

## 2021-03-08 NOTE — Progress Notes (Signed)
Medical screening examination/treatment was performed by qualified clinical staff member and as supervising physician I was immediately available for consultation/collaboration. I have reviewed documentation and agree with assessment and plan.  Yaresly Menzel, DO  

## 2021-03-24 ENCOUNTER — Other Ambulatory Visit: Payer: Self-pay | Admitting: Family Medicine

## 2021-03-26 ENCOUNTER — Other Ambulatory Visit: Payer: Self-pay

## 2021-03-26 ENCOUNTER — Ambulatory Visit: Payer: Medicare HMO | Admitting: Neurology

## 2021-03-26 ENCOUNTER — Encounter: Payer: Self-pay | Admitting: Neurology

## 2021-03-26 VITALS — BP 125/72 | HR 78 | Ht 66.0 in | Wt 264.0 lb

## 2021-03-26 DIAGNOSIS — F419 Anxiety disorder, unspecified: Secondary | ICD-10-CM

## 2021-03-26 DIAGNOSIS — G20 Parkinson's disease: Secondary | ICD-10-CM | POA: Diagnosis not present

## 2021-03-26 DIAGNOSIS — M25471 Effusion, right ankle: Secondary | ICD-10-CM | POA: Diagnosis not present

## 2021-03-26 DIAGNOSIS — Z6841 Body Mass Index (BMI) 40.0 and over, adult: Secondary | ICD-10-CM | POA: Diagnosis not present

## 2021-03-26 DIAGNOSIS — M25472 Effusion, left ankle: Secondary | ICD-10-CM

## 2021-03-26 MED ORDER — BUSPIRONE HCL 5 MG PO TABS: 5.0000 mg | ORAL_TABLET | Freq: Two times a day (BID) | ORAL | 3 refills | Status: DC | PRN

## 2021-03-26 NOTE — Patient Instructions (Addendum)
We will continue with the Neupro 4 mg daily.   We will try you on a medication called BuSpar for anxiety.  We will start with 5 mg strength, you can take 1 pill up to twice daily as needed.

## 2021-03-26 NOTE — Progress Notes (Signed)
Subjective:    Patient ID: Jonathan Yu is a 70 y.o. male.  HPI    Interim history:   Mr. Jonathan Yu is a 70 year old right-handed gentleman with an underlying medical history of degenerative disc disease, hyperlipidemia, hypertension, diabetes, vitamin D deficiency, obstructive sleep apnea (on PAP therapy), arthritis, status post multiple surgeries allergies, low testosterone, and severe obesity with a BMI of over 40, who presents for follow-up consultation of his parkinsonism. The patient is unaccompanied today.  I last saw him on 12/28/2020, at which time I suggested he start Neupro patch.  We talked about his DaTscan results supporting Parkinson's disease.   Today, 03/26/2021: He reports feeling better with regards to his tremor.  After the first month of Neupro 2 mg strength, there was a delay before he got his 4 mg strength, he was without medication for a few days.  He is wondering if he had some withdrawal but also describes symptoms that could be in keeping with a viral syndrome including myalgias and fever.  He is able to tolerate the Neupro patch.  He feels that his tremor is less but it does fluctuate.  He had 2 areas of skin reaction after he peeled the patch off, this was in the left lower abdominal area but since then he is adding additional areas to where he can put the patch including his thighs.  He is going to start working out at the gym.  Unfortunately, the Neupro patch is rather expensive but for now he is okay continuing.  About the same.  He is without any new complaints.  He has not had any falls.  He is planning to exercise a little bit more regularly.  His daughter who is a nurse has sent him resonates as she found some evidence that these could be beneficial, 1-3 [place] nuts daily, for Parkinson's patients.  He is trying to stay active.  He tries to hydrate well.  He is working on weight loss.  He has had more issues with anxiety.  He has taken Xanax as needed through his primary  care.  He would like to discuss anxiety management.   The patient's allergies, current medications, family history, past medical history, past social history, past surgical history and problem list were reviewed and updated as appropriate.    Previously:     I first met him on 11/22/2020 at the request of his primary care physician, at which time the patient gave an approximately 2-year history of tremors affecting primarily his right lower and upper extremities.  His presentation was concerning for parkinsonism.  He was advised to proceed with a nuclear medicine DaTscan.   He had a DaTscan on 12/26/2020 and I reviewed the results:  IMPRESSION: Marked decreased activity within the bilateral posterior striatum is a pattern typical of Parkinsonian syndrome pathology.   Of note, DaTSCAN is not diagnostic of Parkinsonian syndromes, which remains a clinical diagnosis. DaTscan is an adjuvant test to aid in the clinical diagnosis of Parkinsonian syndromes.   We called him with his test results.     11/22/20: (He) reports an approximately 2-year history of tremors affecting primarily the right side.  He first noticed right foot tremor.  His tremor has progressed, sometimes his left side feels worse, he has also noticed fine motor dyscontrol and difficulty with movement in general, feels fatigued.  His symptoms got worse when he had Covid some 18 months ago and also reports that his anxiety has become much more noticeable.  Previously,  he was not an anxious person, now he is often anxious.  He does have a prescription for Xanax but rarely uses it.  He has not fallen recently.  He tries to stay active.  He has multiple joint related issues including right ankle replacement, left ankle injury and repair some 50 years ago, no hardware in place but right ankle replacement some 10 years ago after an accident.  He quit working after that.  He also has right knee pain and bilateral rotator cuff problems, status  post surgeries twice on the right, no surgery on the left but significant decrease in range of motion in the left shoulder.  He works with a physical therapist about once a week.  He has not been exercising as such on a regular basis.  He is generally concerned about his constellation of symptoms such as tremors, mobility issues, problems with fatigue and overall deconditioning.  He is divorced and lives with his youngest daughter who is 58 and his son who is 32, and divorced.  He has a total of 6 children.  He himself is the oldest of 5 kids, had 2 sisters and 2 brothers, younger sister passed away.  His father had Parkinson's disease later in life, lived to be 24, mom died from lung cancer at 30.  He does not smoke cigarettes.  He does occasionally smoke a cigar but none in a year approximately.  He drinks alcohol rarely.  He does not drink caffeine on a day-to-day basis.   He uses his CPAP faithfully.  He is retired from working with [employer] for many years.  He does have a remote history of dream enactment behaviors.  These have been infrequently now.  He fell out of bed some 3 years ago as he was dreaming about fighting somebody.  He does have a history of sleep talking.   I reviewed your office note from 05/14/2021. He had blood work through your office on 09/07/2020 and I reviewed the results, vitamin D was mildly low at 27.  His TSH was normal in August 2021.  His B12 was 407 on 03/16/2020.  A1c was 6.0.   His Past Medical History Is Significant For: Past Medical History:  Diagnosis Date   Allergy    DDD (degenerative disc disease), lumbar    Hyperlipidemia    "no meds since losing 106# 2 yr ago" (10/06/2013)   Hypertension 2012   "no meds since losing 106# 2 yr ago" (10/06/2013)   OSA on CPAP    "don't wear mask much since losing 106# 2 yr ago" (10/06/2013)   Type II diabetes mellitus ([place])    "no meds since losing 106# 2 yr ago" (10/06/2013)   Walking pneumonia 1968    His Past Surgical  History Is Significant For: Past Surgical History:  Procedure Laterality Date   ANKLE FRACTURE SURGERY Left 1973   ANKLE RECONSTRUCTION Right 05/2011   CARPAL TUNNEL RELEASE Bilateral 2001   HARDWARE REMOVAL Right 10/07/2013   Procedure: RIGHT ANKLE HARDWARE REMOVAL;  Surgeon: Newt Minion, MD;  Location: [facility];  Service: Orthopedics;  Laterality: Right;   I & D EXTREMITY Right 11/18/2013   Procedure: IRRIGATION AND DEBRIDEMENT EXTREMITY;  Surgeon: Newt Minion, MD;  Location: [place];  Service: Orthopedics;  Laterality: Right;  Irrigation and Debridement Right Fibula , Place Antibiotic Beads and VAC   NASAL SEPTUM SURGERY  1982   ORIF ANKLE FRACTURE Right 09/08/2013   Procedure: REPAIR SYNDESMOSIS DISRUPTION RIGHT ANKLE;  Surgeon: Nadara Mustard, MD;  Location[facility] OR;  Service: Orthopedics;  Laterality: Right;   SHOULDER ARTHROSCOPY W/ ROTATOR CUFF REPAIR Right 2006; 2009   TONSILLECTOMY  1959   TOTAL ANKLE ARTHROPLASTY Right 08/18/2013   Procedure: TOTAL ANKLE ARTHOPLASTY;  Surgeon: Nadara Mustard, MD;  Location: [facility] OR;  Service: Orthopedics;  Laterality: Right;  Right Total Ankle Arthroplasty, Revision Fibular Fracture   URETERAL STENT PLACEMENT  ~ 2010 X 2    His Family History Is Significant For: Family History  Problem Relation Age of Onset   Cancer Mother 89       Lung   Alzheimer's disease Father    Parkinson's disease Father    Scoliosis Sister    Cleft lip Sister     His Social History Is Significant For: Social History   Socioeconomic History   Marital status: Divorced    Spouse name: Not on file   Number of children: 5   Years of education: Not on file   Highest education level: Not on file  Occupational History   Occupation: sales/ DJ  Tobacco Use   Smoking status: Some Days    Types: Pipe, Cigars   Smokeless tobacco: Never   Tobacco comments:    10/06/2013 "use pipe or cigar 1-2 times/month"  Substance and Sexual Activity   Alcohol use: Yes    Comment: 10/06/2013  "have 4-5 drinks/yr"   Drug use: No   Sexual activity: Yes    Comment: 1 every 2 months  Other Topics Concern   Not on file  Social History Narrative   Not on file   Social Determinants of Health   Financial Resource Strain: Not on file  Food Insecurity: Not on file  Transportation Needs: Not on file  Physical Activity: Not on file  Stress: Not on file  Social Connections: Not on file    His Allergies Are:  Allergies  Allergen Reactions   Sesame Oil Hives and Shortness Of Breath   Clonazepam Hives   Percocet [Oxycodone-Acetaminophen] Rash   Topamax [Topiramate]     Cognitive decline   Vicodin [Hydrocodone-Acetaminophen] Rash  :   His Current Medications Are:  Outpatient Encounter Medications as of 03/26/2021  Medication Sig   albuterol (PROVENTIL HFA;VENTOLIN HFA) 108 (90 Base) MCG/ACT inhaler Inhale 2 puffs into the lungs every 6 (six) hours as needed for wheezing or shortness of breath.   ALPRAZolam (XANAX) 0.5 MG tablet Take 0.5-1 tablets (0.25-0.5 mg total) by mouth at bedtime as needed for sleep.   AMBULATORY NON FORMULARY MEDICATION Continue CPAP at current settings.  Please provide new supplies. [name] phone number [phone]   AMBULATORY NON FORMULARY MEDICATION Compression stockings.  Vive size LGM Leg swelling. Disp 1 pair 99 refil.   anastrozole (ARIMIDEX) 1 MG tablet TAKE 1 TABLET EVERY DAY   atorvastatin (LIPITOR) 40 MG tablet Take 1 tablet (40 mg total) by mouth daily.   Azelaic Acid 15 % cream Apply 1 application topically 2 (two) times daily.   docusate sodium (COLACE) 50 MG capsule Take 50 mg by mouth daily.   fluticasone (FLONASE) 50 MCG/ACT nasal spray Place into both nostrils daily.   loratadine (CLARITIN) 10 MG tablet Take 10 mg by mouth daily.   losartan (COZAAR) 50 MG tablet Take 1 tablet (50 mg total) by mouth daily.   Misc. Devices (FLEX THERAPY) MISC by Does not apply route.   Multiple Vitamin (MULTIVITAMIN WITH MINERALS) TABS  tablet Take 1 tablet by mouth daily.  Probiotic Product (PROBIOTIC PO) Take by mouth daily.   Rotigotine (NEUPRO) 1 MG/24HR PT24 2 patches daily   rotigotine (NEUPRO) 4 MG/24HR Place 1 patch onto the skin daily.   tadalafil (CIALIS) 20 MG tablet TAKE HALF TO ONE TABLET BY MOUTH EVERY OTHER DAY AS NEEDED FOR ERECTILE DYSFUNCTION   tamsulosin (FLOMAX) 0.4 MG CAPS capsule Take 1 capsule (0.4 mg total) by mouth daily after breakfast.   VITAMIN D PO Take by mouth.   Facility-Administered Encounter Medications as of 03/26/2021  Medication   testosterone cypionate (DEPOTESTOSTERONE CYPIONATE) injection 200 mg  :  Review of Systems:  Out of a complete 14 point review of systems, all are reviewed and negative with the exception of these symptoms as listed below:  Review of Systems  Neurological:        Pt  states her for 3 month follow up for Parkinson's . Pt states when he is sleeping grinding his teeth. Pt states he is biting his his cheek. Pt States he is not able to whistle. Pt complains about double vision when reading close up. Tremors in right leg and left shoulder . Pt states he does drool at times    Objective:  Neurological Exam  Physical Exam Physical Examination:   Vitals:   03/26/21 1059  BP: 125/72  Temp: (!) 78 F (25.6 C)    General Examination: The patient is a very pleasant 70 y.o. male in no acute distress. He appears well-developed and well-nourished and well groomed.   HEENT: Normocephalic, atraumatic, pupils are equal, round and reactive to light, extraocular tracking is generally preserved, mild decrease in eye blink rate noted and mild to moderate facial masking noted.  Mild nuchal rigidity noted, no lip, neck or jaw tremor, no carotid bruits.  Airway examination reveals mild mouth dryness, tongue protrudes centrally and palate elevates symmetrically.  Moderate airway crowding.  No significant hypophonia, no dysarthria noted.    Chest: Clear to auscultation without  wheezing, rhonchi or crackles noted.   Heart: S1+S2+0, regular and normal without murmurs, rubs or gallops noted.    Abdomen: Soft, non-tender and non-distended with normal bowel sounds appreciated on auscultation.  Healing, well-circumscribed round scars left lower abdominal skin area.  No surrounding erythema.   Extremities: There is trace to 1+ pitting edema in both lower extremities, more so on the left side.  Left leg larger in caliber than right.     Skin: Warm and dry without trophic changes noted.    Musculoskeletal: exam reveals decreased range of motion in both shoulders, right shoulder is higher than left.  Scar left ankle and distal leg, scar right ankle.      Neurologically:  Mental status: The patient is awake, alert and oriented in all 4 spheres. His immediate and remote memory, attention, language skills and fund of knowledge are appropriate. There is no evidence of aphasia, agnosia, apraxia or anomia. Mood is normal and affect is normal.  Cranial nerves II - XII are as described above under HEENT exam.  Unequalshoulder height noted.   Motor exam: Normal bulk, mild increase in tone in the right more than left upper extremity, no telltale cogwheeling.  He has a slight and intermittent resting tremor in the right upper and right lower extremity.  He has no significant postural or action tremor. No left sided resting tremor.    (On 11/22/2020: On Archimedes spiral drawing he has no  significant trembling with the right hand which is his dominant hand, insecurity noted  with the left hand but no trembling as such, handwriting is on the smaller side, legible.)     Reflexes are diminished throughout. Fine motor skills shows mild to moderate impairment with finger taps on the right side, slightly better on the left, foot taps are mild to moderately impaired bilaterally, overall slightly better compared to last time.  Rapid alternating patting and hand movements are fairly well-preserved  bilaterally.   Cerebellar testing: No dysmetria or intention tremor on finger to nose testing. Heel to shin is unremarkable bilaterally. There is no truncal or gait ataxia.  Sensory exam: intact to light touch in the upper and lower extremities.  Gait, station and balance: He stands with mild difficulty, pushes himself up, posture is age-appropriate to slightly stooped for age.  He walks with no telltale shuffling but decreased arm swing on the right.  He walks slightly slowly, turns slowly.    Assessment and Plan:    In summary, Robertson Colclough is a very pleasant 70 year old male with an underlying medical history of degenerative disc disease, hyperlipidemia, hypertension, diabetes, vitamin D deficiency, obstructive sleep apnea, arthritis, status post multiple surgeries allergies, low testosterone, and severe obesity with a BMI of over 40, who presents for follow-up consultation of his parkinsonism.  He had a DaTscan on 12/26/2020 which showed marked decreased activity within the bilateral posterior striatum, and was, as such supportive of an underlying parkinsonian syndrome.  His history and presentation are in keeping with right-sided parkinsonism, likely right-sided predominant Parkinson's disease, no obvious atypical features at this time.  He started Neupro patch in May 2022 with good tolerance.  He was able to increase it to 4 mg once daily.  Unfortunately, there was a little gap between increasing from 2 mg to 4 mg of a few days where he was without medication.  He is tolerating it well and has noticed an improvement in his tremor.  Exam is fairly stable, slightly better with lower extremity coordination and fine motor skills today.  He is advised to continue to monitor his swelling.  He is advised that dopamine agonist can cause lower extremity edema.   We talked about the importance of maintaining a healthy lifestyle, weight loss, maintaining good nutrition, good hydration, regular exercise and  being proactive about constipation issues.  Of note, he has noted intermittent constipation for the past year and prior to that never had an issue with constipation.  He recently joined the gym again.  He is planning to be more active on a regular basis.  He is advised to continue with his Neupro at 4 mg strength once daily.  He had a couple of areas where he had a reaction to the patch but generally he is tolerating it well and has added more patch locations on his thighs.  We talked about anxiety management today.  He is advised to start a trial of as needed BuSpar 5 mg strength, take 1 pill up to twice daily as needed for now.  He is advised to follow-up routinely in this clinic in about 4 months, sooner if needed.  He reports that the Neupro patches very expensive.  Since he has just filled a 87-month supply, he is agreeable to continuing it.  We may have to consider an oral dopamine agonist if cost is still a factor in the near future.  I answered all his questions today and he was in agreement. I spent 30 minutes in total face-to-face time and in reviewing records during pre-charting,  more than 50% of which was spent in counseling and coordination of care, reviewing test results, reviewing medications and treatment regimen and/or in discussing or reviewing the diagnosis of PD, the prognosis and treatment options. Pertinent laboratory and imaging test results that were available during this visit with the patient were reviewed by me and considered in my medical decision making (see chart for details).

## 2021-03-29 ENCOUNTER — Encounter: Payer: Self-pay | Admitting: Neurology

## 2021-03-29 ENCOUNTER — Other Ambulatory Visit: Payer: Self-pay

## 2021-03-29 ENCOUNTER — Ambulatory Visit (INDEPENDENT_AMBULATORY_CARE_PROVIDER_SITE_OTHER): Payer: Medicare HMO | Admitting: Family Medicine

## 2021-03-29 VITALS — BP 152/87 | HR 67 | Temp 98.8°F

## 2021-03-29 DIAGNOSIS — E291 Testicular hypofunction: Secondary | ICD-10-CM

## 2021-03-29 MED ORDER — TESTOSTERONE CYPIONATE 200 MG/ML IM SOLN
200.0000 mg | Freq: Once | INTRAMUSCULAR | Status: AC
  Administered 2021-03-29: 200 mg via INTRAMUSCULAR

## 2021-03-29 MED ORDER — BUSPIRONE HCL 5 MG PO TABS: 5.0000 mg | ORAL_TABLET | Freq: Two times a day (BID) | ORAL | 3 refills | Status: DC | PRN

## 2021-03-29 NOTE — Progress Notes (Signed)
Medical screening examination/treatment was performed by qualified clinical staff member and as supervising provider I was immediately available for consultation/collaboration. I have reviewed documentation and agree with assessment and plan.  Purcell Nails Olevia Bowens, DNP, FNP-C

## 2021-03-29 NOTE — Progress Notes (Signed)
HPI:  Patient is here for a testosterone injection.  Denies chest pains, shortness of breath, headaches and problems with medication or mood changes.  Assessment and Plan:  Patient tolerated injection well without complications.  Patient advised to schedule next injection in 14 days.  Charyl Bigger, CMA

## 2021-04-19 ENCOUNTER — Telehealth: Payer: Self-pay | Admitting: Lab

## 2021-04-19 ENCOUNTER — Ambulatory Visit (INDEPENDENT_AMBULATORY_CARE_PROVIDER_SITE_OTHER): Payer: Medicare HMO | Admitting: Family Medicine

## 2021-04-19 ENCOUNTER — Other Ambulatory Visit: Payer: Self-pay

## 2021-04-19 ENCOUNTER — Telehealth: Payer: Self-pay | Admitting: Family Medicine

## 2021-04-19 VITALS — BP 147/75 | HR 64

## 2021-04-19 DIAGNOSIS — E291 Testicular hypofunction: Secondary | ICD-10-CM | POA: Diagnosis not present

## 2021-04-19 MED ORDER — TESTOSTERONE CYPIONATE 200 MG/ML IM SOLN
200.0000 mg | Freq: Once | INTRAMUSCULAR | Status: AC
  Administered 2021-04-19: 200 mg via INTRAMUSCULAR

## 2021-04-19 NOTE — Progress Notes (Signed)
Medical screening examination/treatment was performed by qualified clinical staff member and as supervising physician I was immediately available for consultation/collaboration. I have reviewed documentation and agree with assessment and plan.  Jamy Whyte, DO  

## 2021-04-19 NOTE — Progress Notes (Signed)
Established Patient Nurse Visit  Subjective:  Patient ID: Jonathan Yu, male    DOB: 1951/05/17  Age: 70 y.o. MRN: [id number]  CC:  Chief Complaint  Patient presents with   Injections    HPI Jonathan Yu presents for testosterone injection.  Past Medical History:  Diagnosis Date   Allergy    DDD (degenerative disc disease), lumbar    Hyperlipidemia    "no meds since losing 106# 2 yr ago" (10/06/2013)   Hypertension 2012   "no meds since losing 106# 2 yr ago" (10/06/2013)   OSA on CPAP    "don't wear mask much since losing 106# 2 yr ago" (10/06/2013)   Type II diabetes mellitus ([place])    "no meds since losing 106# 2 yr ago" (10/06/2013)   Walking pneumonia 1968    Past Surgical History:  Procedure Laterality Date   ANKLE FRACTURE SURGERY Left 1973   ANKLE RECONSTRUCTION Right 05/2011   CARPAL TUNNEL RELEASE Bilateral 2001   HARDWARE REMOVAL Right 10/07/2013   Procedure: RIGHT ANKLE HARDWARE REMOVAL;  Surgeon: Newt Minion, MD;  Location: [facility];  Service: Orthopedics;  Laterality: Right;   I & D EXTREMITY Right 11/18/2013   Procedure: IRRIGATION AND DEBRIDEMENT EXTREMITY;  Surgeon: Newt Minion, MD;  Location: [facility];  Service: Orthopedics;  Laterality: Right;  Irrigation and Debridement Right Fibula , Place Antibiotic Beads and VAC   NASAL SEPTUM SURGERY  1982   ORIF ANKLE FRACTURE Right 09/08/2013   Procedure: REPAIR SYNDESMOSIS DISRUPTION RIGHT ANKLE;  Surgeon: Newt Minion, MD;  Location: [facility];  Service: Orthopedics;  Laterality: Right;   SHOULDER ARTHROSCOPY W/ ROTATOR CUFF REPAIR Right 2006; 2009   TONSILLECTOMY  1959   TOTAL ANKLE ARTHROPLASTY Right 08/18/2013   Procedure: TOTAL ANKLE ARTHOPLASTY;  Surgeon: Newt Minion, MD;  Location: [facility];  Service: Orthopedics;  Laterality: Right;  Right Total Ankle Arthroplasty, Revision Fibular Fracture   URETERAL STENT PLACEMENT  ~ 2010 X 2    Family History  Problem Relation Age of Onset   Cancer Mother 23       Lung    Alzheimer's disease Father    Parkinson's disease Father    Scoliosis Sister    Cleft lip Sister     Social History   Socioeconomic History   Marital status: Divorced    Spouse name: Not on file   Number of children: 5   Years of education: Not on file   Highest education level: Not on file  Occupational History   Occupation: sales/ DJ  Tobacco Use   Smoking status: Some Days    Types: Pipe, Cigars   Smokeless tobacco: Never   Tobacco comments:    10/06/2013 "use pipe or cigar 1-2 times/month"  Substance and Sexual Activity   Alcohol use: Yes    Comment: 10/06/2013 "have 4-5 drinks/yr"   Drug use: No   Sexual activity: Yes    Comment: 1 every 2 months  Other Topics Concern   Not on file  Social History Narrative   Not on file   Social Determinants of Health   Financial Resource Strain: Not on file  Food Insecurity: Not on file  Transportation Needs: Not on file  Physical Activity: Not on file  Stress: Not on file  Social Connections: Not on file  Intimate Partner Violence: Not on file    Outpatient Medications Prior to Visit  Medication Sig Dispense Refill   albuterol (PROVENTIL HFA;VENTOLIN HFA) 108 (90 Base)  MCG/ACT inhaler Inhale 2 puffs into the lungs every 6 (six) hours as needed for wheezing or shortness of breath. 1 Inhaler 0   ALPRAZolam (XANAX) 0.5 MG tablet Take 0.5-1 tablets (0.25-0.5 mg total) by mouth at bedtime as needed for sleep. 20 tablet 3   AMBULATORY NON FORMULARY MEDICATION Continue CPAP at current settings.  Please provide new supplies. Micheal Likens phone number [phone] 1 each 0   AMBULATORY NON FORMULARY MEDICATION Compression stockings.  Vive size LGM Leg swelling. Disp 1 pair 99 refil. 1 each 99   anastrozole (ARIMIDEX) 1 MG tablet TAKE 1 TABLET EVERY DAY 90 tablet 1   atorvastatin (LIPITOR) 40 MG tablet Take 1 tablet (40 mg total) by mouth daily. 90 tablet 3   Azelaic Acid 15 % cream Apply 1 application topically 2 (two) times  daily. 50 g 12   busPIRone (BUSPAR) 5 MG tablet Take 1 tablet (5 mg total) by mouth 2 (two) times daily as needed. 60 tablet 3   docusate sodium (COLACE) 50 MG capsule Take 50 mg by mouth daily.     fluticasone (FLONASE) 50 MCG/ACT nasal spray Place into both nostrils daily.     loratadine (CLARITIN) 10 MG tablet Take 10 mg by mouth daily.     losartan (COZAAR) 50 MG tablet Take 1 tablet (50 mg total) by mouth daily. 90 tablet 3   Misc. Devices (FLEX THERAPY) MISC by Does not apply route.     Multiple Vitamin (MULTIVITAMIN WITH MINERALS) TABS tablet Take 1 tablet by mouth daily.     Probiotic Product (PROBIOTIC PO) Take by mouth daily.     rotigotine (NEUPRO) 4 MG/24HR Place 1 patch onto the skin daily. 90 patch 1   tadalafil (CIALIS) 20 MG tablet TAKE 1/2 TO 1 (ONE-HALF TO ONE) TABLET BY MOUTH EVERY OTHER DAY AS NEEDED FOR ERECTILE DYSFUNCTION 30 tablet 0   tamsulosin (FLOMAX) 0.4 MG CAPS capsule Take 1 capsule (0.4 mg total) by mouth daily after breakfast. 90 capsule 3   VITAMIN D PO Take by mouth.     Rotigotine (NEUPRO) 1 MG/24HR PT24 2 patches daily 14 patch 0   Facility-Administered Medications Prior to Visit  Medication Dose Route Frequency Provider Last Rate Last Admin   testosterone cypionate (DEPOTESTOSTERONE CYPIONATE) injection 200 mg  200 mg Intramuscular Q21 days Luetta Nutting, DO   200 mg at 03/08/21 1539    Allergies  Allergen Reactions   Sesame Oil Hives and Shortness Of Breath   Clonazepam Hives   Percocet [Oxycodone-Acetaminophen] Rash   Topamax [Topiramate]     Cognitive decline   Vicodin [Hydrocodone-Acetaminophen] Rash         Objective:    Physical Exam  BP (!) 158/80   Pulse 65   SpO2 93%  Wt Readings from Last 3 Encounters:  03/26/21 264 lb (119.7 kg)  02/15/21 257 lb (116.6 kg)  01/29/21 261 lb 6.4 oz (118.6 kg)     Health Maintenance Due  Topic Date Due   Zoster Vaccines- Shingrix (1 of 2) Never done   COVID-19 Vaccine (3 - Pfizer risk  series) 12/08/2019   INFLUENZA VACCINE  03/12/2021    There are no preventive care reminders to display for this patient.  Lab Results  Component Value Date   TSH 3.21 03/16/2020   Lab Results  Component Value Date   WBC 7.7 09/07/2020   HGB 18.1 (H) 09/07/2020   HCT 52.4 (H) 09/07/2020   MCV 89.3 09/07/2020   PLT 201  09/07/2020   Lab Results  Component Value Date   NA 137 03/16/2020   K 4.2 03/16/2020   CO2 29 03/16/2020   GLUCOSE 122 (H) 03/16/2020   BUN 12 03/16/2020   CREATININE 1.03 03/16/2020   BILITOT 2.2 (H) 03/16/2020   ALKPHOS 39 (L) 01/07/2017   AST 21 03/16/2020   ALT 30 03/16/2020   PROT 6.9 03/16/2020   ALBUMIN 3.5 (L) 01/07/2017   CALCIUM 9.3 03/16/2020   ANIONGAP 11 07/31/2019   Lab Results  Component Value Date   CHOL 197 03/16/2020   Lab Results  Component Value Date   HDL 35 (L) 03/16/2020   Lab Results  Component Value Date   LDLCALC 126 (H) 03/16/2020   Lab Results  Component Value Date   TRIG 224 (H) 03/16/2020   Lab Results  Component Value Date   CHOLHDL 5.6 (H) 03/16/2020   Lab Results  Component Value Date   HGBA1C 6.0 (H) 03/16/2020      Assessment & Plan:   Problem List Items Addressed This Visit       Endocrine   Hypogonadism in male - Primary   Relevant Medications   testosterone cypionate (DEPOTESTOSTERONE CYPIONATE) injection 200 mg (Start on 04/19/2021 10:15 AM)    Meds ordered this encounter  Medications   testosterone cypionate (DEPOTESTOSTERONE CYPIONATE) injection 200 mg   Denies chest pain, shortness of breath, headaches, and problems with medication or mood changes.   Blood pressure is elevated today.  First reading: 158/80 After 10 minutes his second reading: 147/75 He states blood pressure is always elevated here and good at home.  Patient tolerated testosterone 200 mg injection to RUOQ well without complications.    Follow-up: 3 weeks for repeat injection.

## 2021-04-19 NOTE — Progress Notes (Signed)
  Chronic Care Management   Outreach Note  04/19/2021 Name: Jonathan Yu MRN: [id number] DOB: 12-23-1950  Referred by: Luetta Nutting, DO Reason for referral : Medication Management   An unsuccessful telephone outreach was attempted today. The patient was referred to the pharmacist for assistance with care management and care coordination.   Follow Up Plan:   [name]

## 2021-04-19 NOTE — Telephone Encounter (Signed)
Left message for patient to call back and schedule Medicare Annual Wellness Visit (AWV) either virtually or in office. I gave my number [phone]    Last AWV 09/29/15  please schedule at anytime with health coach

## 2021-04-23 ENCOUNTER — Other Ambulatory Visit: Payer: Self-pay

## 2021-04-23 ENCOUNTER — Ambulatory Visit (INDEPENDENT_AMBULATORY_CARE_PROVIDER_SITE_OTHER): Payer: Medicare HMO | Admitting: Family Medicine

## 2021-04-23 DIAGNOSIS — Z0000 Encounter for general adult medical examination without abnormal findings: Secondary | ICD-10-CM | POA: Diagnosis not present

## 2021-04-23 NOTE — Patient Instructions (Addendum)
[place] Maintenance Summary and Written Plan of Care  Mr. Jonathan Yu ,  Thank you for allowing me to perform your Medicare Annual Wellness Visit and for your ongoing commitment to your health.   Health Maintenance & Immunization History Health Maintenance  Topic Date Due  . COVID-19 Vaccine (3 - Pfizer risk series) 05/09/2021 (Originally 12/08/2019)  . Zoster Vaccines- Shingrix (1 of 2) 07/23/2021 (Originally 09/17/1969)  . INFLUENZA VACCINE  11/09/2021 (Originally 03/12/2021)  . TETANUS/TDAP  04/24/2021  . COLONOSCOPY (Pts 45-51yr Insurance coverage will need to be confirmed)  06/03/2029  . Hepatitis C Screening  Completed  . PNA vac Low Risk Adult  Completed  . HPV VACCINES  Aged Out   Immunization History  Administered Date(s) Administered  . Fluad Quad(high Dose 65+) 04/13/2020  . Influenza Split 07/16/2012  . Influenza Whole 04/25/2011  . Influenza,inj,Quad PF,6+ Mos 03/23/2014, 06/10/2014, 09/29/2015, 03/28/2016, 04/16/2017, 04/30/2018, 04/22/2019  . Influenza-Unspecified 05/12/2013  . PFIZER(Purple Top)SARS-COV-2 Vaccination 10/15/2019, 11/10/2019  . Pneumococcal Conjugate-13 06/16/2009  . Pneumococcal Polysaccharide-23 08/14/2018  . Tdap 04/25/2011  . Zoster, Live 10/11/2014    These are the patient goals that we discussed:  Goals Addressed              This Visit's Progress   .  Patient Stated (pt-stated)        Would like to loose about 50 lbs.        This is a list of Health Maintenance Items that are overdue or due now: Influenza vaccine Td vaccine Shingles vaccine  Orders/Referrals Placed Today: No orders of the defined types were placed in this encounter.  (Contact our referral department at [phone] you have not spoken with someone about your referral appointment within the next 5 days)    Follow-up Plan Follow-up with MLuetta Nutting DO as planned Schedule your shingles vaccine and tetanus vaccine at your  pharmacy. Flu vaccine at your next in-office appointment. Medicare wellness visit in one year. Patient stated he will access AVS on my chart.      Health Maintenance, Male Adopting a healthy lifestyle and getting preventive care are important in promoting health and wellness. Ask your health care provider about: The right schedule for you to have regular tests and exams. Things you can do on your own to prevent diseases and keep yourself healthy. What should I know about diet, weight, and exercise? Eat a healthy diet  Eat a diet that includes plenty of vegetables, fruits, low-fat dairy products, and lean protein. Do not eat a lot of foods that are high in solid fats, added sugars, or sodium. Maintain a healthy weight Body mass index (BMI) is a measurement that can be used to identify possible weight problems. It estimates body fat based on height and weight. Your health care provider can help determine your BMI and help you achieve or maintain a healthy weight. Get regular exercise Get regular exercise. This is one of the most important things you can do for your health. Most adults should: Exercise for at least 150 minutes each week. The exercise should increase your heart rate and make you sweat (moderate-intensity exercise). Do strengthening exercises at least twice a week. This is in addition to the moderate-intensity exercise. Spend less time sitting. Even light physical activity can be beneficial. Watch cholesterol and blood lipids Have your blood tested for lipids and cholesterol at 68years of age, then have this test every 5 years. You may need to have your  cholesterol levels checked more often if: Your lipid or cholesterol levels are high. You are older than 68 years of age. You are at high risk for heart disease. What should I know about cancer screening? Many types of cancers can be detected early and may often be prevented. Depending on your health history and family  history, you may need to have cancer screening at various ages. This may include screening for: Colorectal cancer. Prostate cancer. Skin cancer. Lung cancer. What should I know about heart disease, diabetes, and high blood pressure? Blood pressure and heart disease High blood pressure causes heart disease and increases the risk of stroke. This is more likely to develop in people who have high blood pressure readings, are of African descent, or are overweight. Talk with your health care provider about your target blood pressure readings. Have your blood pressure checked: Every 3-5 years if you are 67-48 years of age. Every year if you are 64 years old or older. If you are between the ages of 4 and 89 and are a current or former smoker, ask your health care provider if you should have a one-time screening for abdominal aortic aneurysm (AAA). Diabetes Have regular diabetes screenings. This checks your fasting blood sugar level. Have the screening done: Once every three years after age 43 if you are at a normal weight and have a low risk for diabetes. More often and at a younger age if you are overweight or have a high risk for diabetes. What should I know about preventing infection? Hepatitis B If you have a higher risk for hepatitis B, you should be screened for this virus. Talk with your health care provider to find out if you are at risk for hepatitis B infection. Hepatitis C Blood testing is recommended for: Everyone born from [birth year] through 1963. Anyone with known risk factors for hepatitis C. Sexually transmitted infections (STIs) You should be screened each year for STIs, including gonorrhea and chlamydia, if: You are sexually active and are younger than 68 years of age. You are older than 68 years of age and your health care provider tells you that you are at risk for this type of infection. Your sexual activity has changed since you were last screened, and you are at increased risk  for chlamydia or gonorrhea. Ask your health care provider if you are at risk. Ask your health care provider about whether you are at high risk for HIV. Your health care provider may recommend a prescription medicine to help prevent HIV infection. If you choose to take medicine to prevent HIV, you should first get tested for HIV. You should then be tested every 3 months for as long as you are taking the medicine. Follow these instructions at home: Lifestyle Do not use any products that contain nicotine or tobacco, such as cigarettes, e-cigarettes, and chewing tobacco. If you need help quitting, ask your health care provider. Do not use street drugs. Do not share needles. Ask your health care provider for help if you need support or information about quitting drugs. Alcohol use Do not drink alcohol if your health care provider tells you not to drink. If you drink alcohol: Limit how much you have to 0-2 drinks a day. Be aware of how much alcohol is in your drink. In the [place] one drink equals one 12 oz bottle of beer (355 mL), one 5 oz glass of wine (148 mL), or one 1 oz glass of hard liquor (44 mL). General instructions Schedule  regular health, dental, and eye exams. Stay current with your vaccines. Tell your health care provider if: You often feel depressed. You have ever been abused or do not feel safe at home. Summary Adopting a healthy lifestyle and getting preventive care are important in promoting health and wellness. Follow your health care provider's instructions about healthy diet, exercising, and getting tested or screened for diseases. Follow your health care provider's instructions on monitoring your cholesterol and blood pressure. This information is not intended to replace advice given to you by your health care provider. Make sure you discuss any questions you have with your health care provider. Document Revised: 10/06/2020 Document Reviewed: 07/22/2018 Elsevier Patient  Education  2022 [employer].

## 2021-04-23 NOTE — Progress Notes (Signed)
MEDICARE ANNUAL WELLNESS VISIT  04/23/2021  Telephone Visit Disclaimer This Medicare AWV was conducted by telephone due to national recommendations for restrictions regarding the COVID-19 Pandemic (e.g. social distancing).  I verified, using two identifiers, that I am speaking with Jonathan Yu or their authorized healthcare agent. I discussed the limitations, risks, security, and privacy concerns of performing an evaluation and management service by telephone and the potential availability of an in-person appointment in the future. The patient expressed understanding and agreed to proceed.  Location of Patient: Home Location of Provider (nurse):  In the office.  Subjective:    Jonathan Yu is a 70 y.o. male patient of Luetta Nutting, DO who had a Medicare Annual Wellness Visit today via telephone. Jonathan Yu is Working part time and lives with their son and daughter comes often. he has 6 children. he reports that he is socially active and does interact with friends/family regularly. he is minimally physically active and enjoys music.  Patient Care Team: Luetta Nutting, DO as PCP - General (Family Medicine) Clent Jacks, MD as Referring Physician (Urology) Newt Minion, MD as Consulting Physician (Orthopedic Surgery)  Advanced Directives 04/23/2021 07/31/2019 10/23/2016 12/21/2013 11/21/2013 10/06/2013 08/18/2013  Does Patient Have a Medical Advance Directive? No No No Patient does not have advance directive;Patient would not like information Patient does not have advance directive Patient does not have advance directive;Patient would not like information Patient does not have advance directive  Would patient like information on creating a medical advance directive? No - Patient declined - Yes (Inpatient - patient requests chaplain consult to create a medical advance directive);Yes (ED - Information included in AVS) - - - -  Pre-existing out of facility DNR order (yellow form or pink MOST  form) - - - - No - -    Hospital Utilization Over the Past 12 Months: # of hospitalizations or ER visits: 0 # of surgeries: 0  Review of Systems    Patient reports that his overall health is unchanged compared to last year.  History obtained from chart review and the patient  Patient Reported Readings (BP, Pulse, CBG, Weight, etc) none  Pain Assessment Pain : 0-10 Pain Score: 3  Pain Type: Chronic pain Pain Location: Ankle Pain Orientation: Left, Right Pain Descriptors / Indicators: Aching Pain Onset: More than a month ago Pain Frequency: Intermittent Pain Relieving Factors: Rest, Ice, Elevating the ankles, Ibuprofen, compression socks (but he is not able to put those unless someone helps him.)  Pain Relieving Factors: Rest, Ice, Elevating the ankles, Ibuprofen, compression socks (but he is not able to put those unless someone helps him.)  Current Medications & Allergies (verified) Allergies as of 04/23/2021       Reactions   Sesame Oil Hives, Shortness Of Breath   Clonazepam Hives   Tape Other (See Comments)   White surgical tape caused some blisters.   Percocet [oxycodone-acetaminophen] Rash   Topamax [topiramate]    Cognitive decline   Vicodin [hydrocodone-acetaminophen] Rash        Medication List        Accurate as of April 23, 2021 10:46 AM. If you have any questions, ask your nurse or doctor.          albuterol 108 (90 Base) MCG/ACT inhaler Commonly known as: VENTOLIN HFA Inhale 2 puffs into the lungs every 6 (six) hours as needed for wheezing or shortness of breath.   ALPRAZolam 0.5 MG tablet Commonly known as: Xanax Take 0.5-1 tablets (0.25-0.5 mg total) by  mouth at bedtime as needed for sleep.   AMBULATORY NON FORMULARY MEDICATION Continue CPAP at current settings.  Please provide new supplies. Jonathan Yu phone number [phone]   AMBULATORY NON FORMULARY MEDICATION Compression stockings.  Vive size LGM Leg swelling. Disp 1  pair 99 refil.   anastrozole 1 MG tablet Commonly known as: ARIMIDEX TAKE 1 TABLET EVERY DAY   atorvastatin 40 MG tablet Commonly known as: LIPITOR Take 1 tablet (40 mg total) by mouth daily.   Azelaic Acid 15 % gel Apply 1 application topically 2 (two) times daily.   busPIRone 5 MG tablet Commonly known as: BUSPAR Take 1 tablet (5 mg total) by mouth 2 (two) times daily as needed.   docusate sodium 50 MG capsule Commonly known as: COLACE Take 50 mg by mouth daily.   Flex Therapy Misc by Does not apply route.   fluticasone 50 MCG/ACT nasal spray Commonly known as: FLONASE Place into both nostrils daily.   loratadine 10 MG tablet Commonly known as: CLARITIN Take 10 mg by mouth daily.   losartan 50 MG tablet Commonly known as: COZAAR Take 1 tablet (50 mg total) by mouth daily.   multivitamin with minerals Tabs tablet Take 1 tablet by mouth daily.   Neupro 4 MG/24HR Generic drug: rotigotine Place 1 patch onto the skin daily.   PROBIOTIC PO Take by mouth daily.   tadalafil 20 MG tablet Commonly known as: CIALIS TAKE 1/2 TO 1 (ONE-HALF TO ONE) TABLET BY MOUTH EVERY OTHER DAY AS NEEDED FOR ERECTILE DYSFUNCTION   tamsulosin 0.4 MG Caps capsule Commonly known as: Flomax Take 1 capsule (0.4 mg total) by mouth daily after breakfast.   VITAMIN D PO Take by mouth.        History (reviewed): Past Medical History:  Diagnosis Date   Allergy    DDD (degenerative disc disease), lumbar    Hyperlipidemia    "no meds since losing 106# 2 yr ago" (10/06/2013)   Hypertension 08/12/2010   "no meds since losing 106# 2 yr ago" (10/06/2013)   OSA on CPAP    "don't wear mask much since losing 106# 2 yr ago" (10/06/2013)   Parkinson's disease ([place])    Type II diabetes mellitus ([place])    "no meds since losing 106# 2 yr ago" (10/06/2013)   Walking pneumonia 08/12/1966   Past Surgical History:  Procedure Laterality Date   ANKLE FRACTURE SURGERY Left 1973   ANKLE  RECONSTRUCTION Right 05/2011   CARPAL TUNNEL RELEASE Bilateral 2001   HARDWARE REMOVAL Right 10/07/2013   Procedure: RIGHT ANKLE HARDWARE REMOVAL;  Surgeon: Newt Minion, MD;  Location: [place];  Service: Orthopedics;  Laterality: Right;   I & D EXTREMITY Right 11/18/2013   Procedure: IRRIGATION AND DEBRIDEMENT EXTREMITY;  Surgeon: Newt Minion, MD;  Location: [place];  Service: Orthopedics;  Laterality: Right;  Irrigation and Debridement Right Fibula , Place Antibiotic Beads and VAC   NASAL SEPTUM SURGERY  1982   ORIF ANKLE FRACTURE Right 09/08/2013   Procedure: REPAIR SYNDESMOSIS DISRUPTION RIGHT ANKLE;  Surgeon: Newt Minion, MD;  Location: [place];  Service: Orthopedics;  Laterality: Right;   SHOULDER ARTHROSCOPY W/ ROTATOR CUFF REPAIR Right 2006; 2009   TONSILLECTOMY  1959   TOTAL ANKLE ARTHROPLASTY Right 08/18/2013   Procedure: TOTAL ANKLE ARTHOPLASTY;  Surgeon: Newt Minion, MD;  Location: [place];  Service: Orthopedics;  Laterality: Right;  Right Total Ankle Arthroplasty, Revision Fibular Fracture   URETERAL STENT PLACEMENT  ~ 2010  X 2   Family History  Problem Relation Age of Onset   Cancer Mother 66       Lung   Alzheimer's disease Father    Parkinson's disease Father    Scoliosis Sister    Cleft lip Sister    Social History   Socioeconomic History   Marital status: Divorced    Spouse name: Not on file   Number of children: 6   Years of education: 14   Highest education level: Some college, no degree  Occupational History   Occupation: sales/ DJ    Comment: part time  Tobacco Use   Smoking status: Some Days    Types: Pipe, Cigars   Smokeless tobacco: Never   Tobacco comments:    cigar 1-2 times/[place]  Vaping Use   Vaping Use: Never used  Substance and Sexual Activity   Alcohol use: Yes    Comment: 1-2 drinks per year.   Drug use: No   Sexual activity: Yes  Other Topics Concern   Not on file  Social History Narrative   Lives with his son and his daughter visits often.  He does Lobbyist, enjoys singing, music and yard work (when he can).    Social Determinants of Health   Financial Resource Strain: Low Risk    Difficulty of Paying Living Expenses: Not hard at all  Food Insecurity: No Food Insecurity   Worried About Charity fundraiser in the Last Year: Never true   [place] in the Last Year: Never true  Transportation Needs: No Transportation Needs   Lack of Transportation (Medical): No   Lack of Transportation (Non-Medical): No  Physical Activity: Inactive   Days of Exercise per Week: 0 days   Minutes of Exercise per Session: 0 min  Stress: No Stress Concern Present   Feeling of Stress : Not at all  Social Connections: Moderately Isolated   Frequency of Communication with Friends and Family: More than three times a week   Frequency of Social Gatherings with Friends and Family: More than three times a week   Attends Religious Services: More than 4 times per year   Active Member of [employer] or Organizations: No   Attends Archivist Meetings: Never   Marital Status: Divorced    Activities of Daily Living In your present state of health, do you have any difficulty performing the following activities: 04/23/2021 11/22/2020  Hearing? N N  Vision? N N  Difficulty concentrating or making decisions? N N  Walking or climbing stairs? Y N  Comment it has been challenging going up and down the stairs due to Parkinson's. -  Dressing or bathing? Y N  Comment still needs help with socks and certain shirts due to previous shoulder surgeries. -  Doing errands, shopping? N N  Preparing Food and eating ? N -  Using the Toilet? Y -  Comment has difficulty cleaning up after a BM -  In the past six months, have you accidently leaked urine? Y -  Comment he leaks urine due to urgency. -  Do you have problems with loss of bowel control? N -  Managing your Medications? N -  Managing your Finances? N -  Housekeeping or managing your Housekeeping? N -   Some recent data might be hidden    Patient Education/ Literacy How often do you need to have someone help you when you read instructions, pamphlets, or other written materials from your doctor or pharmacy?: 1 -  Never What is the last grade level you completed in school?: Some college  Exercise Current Exercise Habits: The patient does not participate in regular exercise at present, Exercise limited by: neurologic condition(s)  Diet Patient reports consuming 3 meals a day and 1 snack(s) a day Patient reports that his primary diet is: Regular Patient reports that she does have regular access to food.   Depression Screen PHQ 2/9 Scores 04/23/2021 01/29/2021 08/02/2019 06/03/2019 04/16/2017 10/23/2016 11/01/2014  PHQ - 2 Score 0 0 6 0 0 1 0  PHQ- 9 Score - - 27 2 - - -     Fall Risk Fall Risk  04/23/2021 01/29/2021 10/14/2019 10/23/2016 11/01/2014  Falls in the past year? 0 0 0 Yes No  Number falls in past yr: 0 0 0 2 or more -  Comment - - - Due to reconstructed ankle and stepping wrong. -  Injury with Fall? 0 0 0 Yes -  Comment - - - Black eye and busted face, seen at [facility]. -  Risk for fall due to : No Fall Risks Impaired balance/gait - Impaired balance/gait -  Follow up Falls evaluation completed Falls evaluation completed Falls evaluation completed Falls prevention discussed -     Objective:  Omero Yu seemed alert and oriented and he participated appropriately during our telephone visit.  Blood Pressure Weight BMI  BP Readings from Last 3 Encounters:  04/19/21 (!) 147/75  03/29/21 (!) 152/87  03/26/21 125/72   Wt Readings from Last 3 Encounters:  03/26/21 264 lb (119.7 kg)  02/15/21 257 lb (116.6 kg)  01/29/21 261 lb 6.4 oz (118.6 kg)   BMI Readings from Last 1 Encounters:  03/26/21 42.61 kg/m    *Unable to obtain current vital signs, weight, and BMI due to telephone visit type  Hearing/Vision  Jonathan Yu did not seem to have difficulty with  hearing/understanding during the telephone conversation Reports that he has had a formal eye exam by an eye care professional within the past year Reports that he has not had a formal hearing evaluation within the past year *Unable to fully assess hearing and vision during telephone visit type  Cognitive Function: 6CIT Screen 04/23/2021  What Year? 0 points  What month? 0 points  What time? 0 points  Count back from 20 0 points  Months in reverse 0 points  Repeat phrase 0 points  Total Score 0   (Normal:0-7, Significant for Dysfunction: >8)  Normal Cognitive Function Screening: Yes   Immunization & Health Maintenance Record Immunization History  Administered Date(s) Administered   Fluad Quad(high Dose 65+) 04/13/2020   Influenza Split 07/16/2012   Influenza Whole 04/25/2011   Influenza,inj,Quad PF,6+ Mos 03/23/2014, 06/10/2014, 09/29/2015, 03/28/2016, 04/16/2017, 04/30/2018, 04/22/2019   Influenza-Unspecified 05/12/2013   PFIZER(Purple Top)SARS-COV-2 Vaccination 10/15/2019, 11/10/2019   Pneumococcal Conjugate-13 06/16/2009   Pneumococcal Polysaccharide-23 08/14/2018   Tdap 04/25/2011   Zoster, Live 10/11/2014    Health Maintenance  Topic Date Due   COVID-19 Vaccine (3 - Pfizer risk series) 05/09/2021 (Originally 12/08/2019)   Zoster Vaccines- Shingrix (1 of 2) 07/23/2021 (Originally 09/17/1971)   INFLUENZA VACCINE  11/09/2021 (Originally 03/12/2021)   TETANUS/TDAP  04/24/2021   COLONOSCOPY (Pts 45-82yr Insurance coverage will need to be confirmed)  06/03/2029   Hepatitis C Screening  Completed   PNA vac Low Risk Adult  Completed   HPV VACCINES  Aged Out       Assessment  This is a routine wellness examination for [employer]  Health Maintenance: Due  or Overdue There are no preventive care reminders to display for this patient.   Jonathan Yu does not need a referral for [employer] Assistance: Care Management:   no Social Work:    no Prescription  Assistance:  no Nutrition/Diabetes Education:  no   Plan:  Personalized Goals  Goals Addressed               This Visit's Progress     Patient Stated (pt-stated)        Would like to loose about 50 lbs.       Personalized Health Maintenance & Screening Recommendations  Influenza vaccine Td vaccine Shingles vaccine  Lung Cancer Screening Recommended: no (Low Dose CT Chest recommended if Age 69-80 years, 30 pack-year currently smoking OR have quit w/in past 15 years) Hepatitis C Screening recommended: no HIV Screening recommended: no  Advanced Directives: Written information was not prepared per patient's request.  Referrals & Orders No orders of the defined types were placed in this encounter.   Follow-up Plan Follow-up with Luetta Nutting, DO as planned Schedule your shingles vaccine and tetanus vaccine at your pharmacy. Flu vaccine at your next in-office appointment. Medicare wellness visit in one year. Patient stated he will access AVS on my chart.   I have personally reviewed and noted the following in the patient's chart:   Medical and social history Use of alcohol, tobacco or illicit drugs  Current medications and supplements Functional ability and status Nutritional status Physical activity Advanced directives List of other physicians Hospitalizations, surgeries, and ER visits in previous 12 months Vitals Screenings to include cognitive, depression, and falls Referrals and appointments  In addition, I have reviewed and discussed with Jonathan Yu certain preventive protocols, quality metrics, and best practice recommendations. A written personalized care plan for preventive services as well as general preventive health recommendations is available and can be mailed to the patient at his request.      Tinnie Gens, RN  04/23/2021

## 2021-05-01 ENCOUNTER — Telehealth: Payer: Self-pay | Admitting: Lab

## 2021-05-01 NOTE — Progress Notes (Signed)
  Chronic Care Management   Outreach Note  05/01/2021 Name: Malacai Grantz MRN: [id number] DOB: 03-Sep-1950  Referred by: Luetta Nutting, DO Reason for referral : Medication Management   A second unsuccessful telephone outreach was attempted today. The patient was referred to pharmacist for assistance with care management and care coordination.  Follow Up Plan:   [place]

## 2021-05-10 ENCOUNTER — Other Ambulatory Visit: Payer: Self-pay

## 2021-05-10 ENCOUNTER — Ambulatory Visit (INDEPENDENT_AMBULATORY_CARE_PROVIDER_SITE_OTHER): Payer: Medicare HMO | Admitting: Family Medicine

## 2021-05-10 VITALS — BP 143/82 | HR 65 | Resp 20 | Ht 66.0 in | Wt 264.0 lb

## 2021-05-10 DIAGNOSIS — E291 Testicular hypofunction: Secondary | ICD-10-CM | POA: Diagnosis not present

## 2021-05-10 NOTE — Progress Notes (Signed)
Established Patient Office Visit  Subjective:  Patient ID: Jonathan Yu, male    DOB: 1951-06-15  Age: 70 y.o. MRN: [id number]  CC:  Chief Complaint  Patient presents with   Hypogonadism    HPI Jonathan Yu presents for a testosterone injection. Denies chest pain, shortness of breath, headaches and problems with medication or mood changes.    Past Medical History:  Diagnosis Date   Allergy    DDD (degenerative disc disease), lumbar    Hyperlipidemia    "no meds since losing 106# 2 yr ago" (10/06/2013)   Hypertension 08/12/2010   "no meds since losing 106# 2 yr ago" (10/06/2013)   OSA on CPAP    "don't wear mask much since losing 106# 2 yr ago" (10/06/2013)   Parkinson's disease ([place])    Type II diabetes mellitus ([place])    "no meds since losing 106# 2 yr ago" (10/06/2013)   Walking pneumonia 08/12/1966    Past Surgical History:  Procedure Laterality Date   ANKLE FRACTURE SURGERY Left 1973   ANKLE RECONSTRUCTION Right 05/2011   CARPAL TUNNEL RELEASE Bilateral 2001   HARDWARE REMOVAL Right 10/07/2013   Procedure: RIGHT ANKLE HARDWARE REMOVAL;  Surgeon: Newt Minion, MD;  Location: [place];  Service: Orthopedics;  Laterality: Right;   I & D EXTREMITY Right 11/18/2013   Procedure: IRRIGATION AND DEBRIDEMENT EXTREMITY;  Surgeon: Newt Minion, MD;  Location: [place];  Service: Orthopedics;  Laterality: Right;  Irrigation and Debridement Right Fibula , Place Antibiotic Beads and VAC   NASAL SEPTUM SURGERY  1982   ORIF ANKLE FRACTURE Right 09/08/2013   Procedure: REPAIR SYNDESMOSIS DISRUPTION RIGHT ANKLE;  Surgeon: Newt Minion, MD;  Location: [place];  Service: Orthopedics;  Laterality: Right;   SHOULDER ARTHROSCOPY W/ ROTATOR CUFF REPAIR Right 2006; 2009   TONSILLECTOMY  1959   TOTAL ANKLE ARTHROPLASTY Right 08/18/2013   Procedure: TOTAL ANKLE ARTHOPLASTY;  Surgeon: Newt Minion, MD;  Location: [place];  Service: Orthopedics;  Laterality: Right;  Right Total Ankle Arthroplasty, Revision  Fibular Fracture   URETERAL STENT PLACEMENT  ~ 2010 X 2    Family History  Problem Relation Age of Onset   Cancer Mother 18       Lung   Alzheimer's disease Father    Parkinson's disease Father    Scoliosis Sister    Cleft lip Sister     Social History   Socioeconomic History   Marital status: Divorced    Spouse name: Not on file   Number of children: 6   Years of education: 14   Highest education level: Some college, no degree  Occupational History   Occupation: sales/ DJ    Comment: part time  Tobacco Use   Smoking status: Some Days    Types: Pipe, Cigars   Smokeless tobacco: Never   Tobacco comments:    cigar 1-2 times/[place]  Vaping Use   Vaping Use: Never used  Substance and Sexual Activity   Alcohol use: Yes    Comment: 1-2 drinks per year.   Drug use: No   Sexual activity: Yes  Other Topics Concern   Not on file  Social History Narrative   Lives with his son and his daughter visits often. He does Lobbyist, enjoys singing, music and yard work (when he can).    Social Determinants of Health   Financial Resource Strain: Low Risk    Difficulty of Paying Living Expenses: Not hard at all  Food Insecurity:  No Food Insecurity   Worried About Charity fundraiser in the Last Year: Never true   Ran Out of Food in the Last Year: Never true  Transportation Needs: No Transportation Needs   Lack of Transportation (Medical): No   Lack of Transportation (Non-Medical): No  Physical Activity: Inactive   Days of Exercise per Week: 0 days   Minutes of Exercise per Session: 0 min  Stress: No Stress Concern Present   Feeling of Stress : Not at all  Social Connections: Moderately Isolated   Frequency of Communication with Friends and Family: More than three times a week   Frequency of Social Gatherings with Friends and Family: More than three times a week   Attends Religious Services: More than 4 times per year   Active Member of [employer] or Organizations: No   Attends Theatre manager Meetings: Never   Marital Status: Divorced  Human resources officer Violence: Not At Risk   Fear of Current or Ex-Partner: No   Emotionally Abused: No   Physically Abused: No   Sexually Abused: No    Outpatient Medications Prior to Visit  Medication Sig Dispense Refill   albuterol (PROVENTIL HFA;VENTOLIN HFA) 108 (90 Base) MCG/ACT inhaler Inhale 2 puffs into the lungs every 6 (six) hours as needed for wheezing or shortness of breath. 1 Inhaler 0   ALPRAZolam (XANAX) 0.5 MG tablet Take 0.5-1 tablets (0.25-0.5 mg total) by mouth at bedtime as needed for sleep. 20 tablet 3   AMBULATORY NON FORMULARY MEDICATION Continue CPAP at current settings.  Please provide new supplies. Micheal Likens phone number [phone] 1 each 0   AMBULATORY NON FORMULARY MEDICATION Compression stockings.  Vive size LGM Leg swelling. Disp 1 pair 99 refil. 1 each 99   anastrozole (ARIMIDEX) 1 MG tablet TAKE 1 TABLET EVERY DAY 90 tablet 1   atorvastatin (LIPITOR) 40 MG tablet Take 1 tablet (40 mg total) by mouth daily. 90 tablet 3   Azelaic Acid 15 % cream Apply 1 application topically 2 (two) times daily. 50 g 12   busPIRone (BUSPAR) 5 MG tablet Take 1 tablet (5 mg total) by mouth 2 (two) times daily as needed. 60 tablet 3   docusate sodium (COLACE) 50 MG capsule Take 50 mg by mouth daily.     fluticasone (FLONASE) 50 MCG/ACT nasal spray Place into both nostrils daily.     loratadine (CLARITIN) 10 MG tablet Take 10 mg by mouth daily.     losartan (COZAAR) 50 MG tablet Take 1 tablet (50 mg total) by mouth daily. 90 tablet 3   Misc. Devices (FLEX THERAPY) MISC by Does not apply route.     Multiple Vitamin (MULTIVITAMIN WITH MINERALS) TABS tablet Take 1 tablet by mouth daily.     Probiotic Product (PROBIOTIC PO) Take by mouth daily.     rotigotine (NEUPRO) 4 MG/24HR Place 1 patch onto the skin daily. 90 patch 1   tadalafil (CIALIS) 20 MG tablet TAKE 1/2 TO 1 (ONE-HALF TO ONE) TABLET BY MOUTH EVERY OTHER DAY  AS NEEDED FOR ERECTILE DYSFUNCTION 30 tablet 0   tamsulosin (FLOMAX) 0.4 MG CAPS capsule Take 1 capsule (0.4 mg total) by mouth daily after breakfast. 90 capsule 3   VITAMIN D PO Take by mouth.     Facility-Administered Medications Prior to Visit  Medication Dose Route Frequency Provider Last Rate Last Admin   testosterone cypionate (DEPOTESTOSTERONE CYPIONATE) injection 200 mg  200 mg Intramuscular Q21 days Luetta Nutting, DO   200  mg at 05/10/21 1020    Allergies  Allergen Reactions   Sesame Oil Hives and Shortness Of Breath   Clonazepam Hives   Tape Other (See Comments)    White surgical tape caused some blisters.   Percocet [Oxycodone-Acetaminophen] Rash   Topamax [Topiramate]     Cognitive decline   Vicodin [Hydrocodone-Acetaminophen] Rash    ROS Review of Systems    Objective:    Physical Exam  BP (!) 143/82 (BP Location: Left Arm, Patient Position: Sitting, Cuff Size: Large)   Pulse 65   Resp 20   Ht 5\' 6"  (1.676 m)   Wt 264 lb (119.7 kg)   SpO2 96%   BMI 42.61 kg/m  Wt Readings from Last 3 Encounters:  05/10/21 264 lb (119.7 kg)  03/26/21 264 lb (119.7 kg)  02/15/21 257 lb (116.6 kg)     Health Maintenance Due  Topic Date Due   COVID-19 Vaccine (3 - Pfizer risk series) 12/08/2019   TETANUS/TDAP  04/24/2021    There are no preventive care reminders to display for this patient.  Lab Results  Component Value Date   TSH 3.21 03/16/2020   Lab Results  Component Value Date   WBC 7.7 09/07/2020   HGB 18.1 (H) 09/07/2020   HCT 52.4 (H) 09/07/2020   MCV 89.3 09/07/2020   PLT 201 09/07/2020   Lab Results  Component Value Date   NA 137 03/16/2020   K 4.2 03/16/2020   CO2 29 03/16/2020   GLUCOSE 122 (H) 03/16/2020   BUN 12 03/16/2020   CREATININE 1.03 03/16/2020   BILITOT 2.2 (H) 03/16/2020   ALKPHOS 39 (L) 01/07/2017   AST 21 03/16/2020   ALT 30 03/16/2020   PROT 6.9 03/16/2020   ALBUMIN 3.5 (L) 01/07/2017   CALCIUM 9.3 03/16/2020   ANIONGAP  11 07/31/2019   Lab Results  Component Value Date   CHOL 197 03/16/2020   Lab Results  Component Value Date   HDL 35 (L) 03/16/2020   Lab Results  Component Value Date   LDLCALC 126 (H) 03/16/2020   Lab Results  Component Value Date   TRIG 224 (H) 03/16/2020   Lab Results  Component Value Date   CHOLHDL 5.6 (H) 03/16/2020   Lab Results  Component Value Date   HGBA1C 6.0 (H) 03/16/2020      Assessment & Plan:  200mg  testosterone injection given in [place]. Pt tolerated well with no apparent complications.  Problem List Items Addressed This Visit   None   No orders of the defined types were placed in this encounter.   Follow-up: Return in about 3 weeks (around 05/31/2021) for Testosterone Injection.    Ninfa Meeker, CMA

## 2021-05-10 NOTE — Progress Notes (Signed)
Medical screening examination/treatment was performed by qualified clinical staff member and as supervising physician I was immediately available for consultation/collaboration. I have reviewed documentation and agree with assessment and plan.  Tu Bayle, DO  

## 2021-05-31 ENCOUNTER — Encounter: Payer: Self-pay | Admitting: Family Medicine

## 2021-05-31 ENCOUNTER — Ambulatory Visit (INDEPENDENT_AMBULATORY_CARE_PROVIDER_SITE_OTHER): Payer: Medicare HMO | Admitting: Family Medicine

## 2021-05-31 ENCOUNTER — Other Ambulatory Visit: Payer: Self-pay

## 2021-05-31 ENCOUNTER — Ambulatory Visit (INDEPENDENT_AMBULATORY_CARE_PROVIDER_SITE_OTHER): Payer: Medicare HMO

## 2021-05-31 VITALS — BP 164/98 | HR 69 | Ht 66.0 in | Wt 267.0 lb

## 2021-05-31 DIAGNOSIS — M7989 Other specified soft tissue disorders: Secondary | ICD-10-CM

## 2021-05-31 DIAGNOSIS — I10 Essential (primary) hypertension: Secondary | ICD-10-CM

## 2021-05-31 DIAGNOSIS — G20 Parkinson's disease: Secondary | ICD-10-CM

## 2021-05-31 DIAGNOSIS — Z23 Encounter for immunization: Secondary | ICD-10-CM | POA: Diagnosis not present

## 2021-05-31 DIAGNOSIS — R600 Localized edema: Secondary | ICD-10-CM | POA: Diagnosis not present

## 2021-05-31 DIAGNOSIS — E291 Testicular hypofunction: Secondary | ICD-10-CM | POA: Diagnosis not present

## 2021-05-31 MED ORDER — TESTOSTERONE CYPIONATE 200 MG/ML IM SOLN: 200.0000 mg | INTRAMUSCULAR | Status: DC

## 2021-05-31 NOTE — Patient Instructions (Signed)
Let's try reducing anastrazole to three times per week.

## 2021-06-01 ENCOUNTER — Other Ambulatory Visit: Payer: Self-pay | Admitting: Family Medicine

## 2021-06-01 MED ORDER — FUROSEMIDE 20 MG PO TABS: 20.0000 mg | ORAL_TABLET | Freq: Every day | ORAL | 3 refills | Status: DC | PRN

## 2021-06-03 NOTE — Assessment & Plan Note (Signed)
Blood pressure elevated today.  Reports that blood pressures at home have been much better controlled.  Asked that he send readings of his blood pressures or bring these to his next testosterone injection.

## 2021-06-03 NOTE — Assessment & Plan Note (Signed)
Management per neurology.  Stable at this time. 

## 2021-06-03 NOTE — Progress Notes (Signed)
Jonathan Yu - 70 y.o. male MRN [id number]  Date of birth: 03-06-1951  Subjective Chief Complaint  Patient presents with   Follow-up    HPI Jonathan Yu is a 70 year old male here today for follow-up visit.  He reports he is doing okay.  He has had increased swelling of his left leg.  He reports he has had a little more pain and cramping sensation in the calf.  He did have an ultrasound a few months ago which was negative for DVT.  He feels like swelling has worsened over the past couple weeks.  He is seeing neurology for management of his Parkinson's.  He is on Neupro which has been helpful however cost of this has become somewhat of an issue.  He has not had any significant side effects with this.  His neurologist did recently add BuSpar for anxiety as well.  He is due for updated testosterone shot today.  He has done well with testosterone injections and is also taking Arimidex for elevated estradiol levels.  Blood pressures remain well controlled with losartan.  He has not had side effects.  He denies chest pain, shortness of breath, palpitations, headache or vision changes.  ROS:  A comprehensive ROS was completed and negative except as noted per HPI   Allergies  Allergen Reactions   Sesame Oil Hives and Shortness Of Breath   Clonazepam Hives   Tape Other (See Comments)    White surgical tape caused some blisters.   Percocet [Oxycodone-Acetaminophen] Rash   Topamax [Topiramate]     Cognitive decline   Vicodin [Hydrocodone-Acetaminophen] Rash    Past Medical History:  Diagnosis Date   Allergy    DDD (degenerative disc disease), lumbar    Hyperlipidemia    "no meds since losing 106# 2 yr ago" (10/06/2013)   Hypertension 08/12/2010   "no meds since losing 106# 2 yr ago" (10/06/2013)   OSA on CPAP    "don't wear mask much since losing 106# 2 yr ago" (10/06/2013)   Parkinson's disease ([place])    Type II diabetes mellitus ([place])    "no meds since losing 106# 2 yr ago" (10/06/2013)    Walking pneumonia 08/12/1966    Past Surgical History:  Procedure Laterality Date   ANKLE FRACTURE SURGERY Left 1973   ANKLE RECONSTRUCTION Right 05/2011   CARPAL TUNNEL RELEASE Bilateral 2001   HARDWARE REMOVAL Right 10/07/2013   Procedure: RIGHT ANKLE HARDWARE REMOVAL;  Surgeon: Newt Minion, MD;  Location: [facility];  Service: Orthopedics;  Laterality: Right;   I & D EXTREMITY Right 11/18/2013   Procedure: IRRIGATION AND DEBRIDEMENT EXTREMITY;  Surgeon: Newt Minion, MD;  Location: [facility];  Service: Orthopedics;  Laterality: Right;  Irrigation and Debridement Right Fibula , Place Antibiotic Beads and VAC   NASAL SEPTUM SURGERY  1982   ORIF ANKLE FRACTURE Right 09/08/2013   Procedure: REPAIR SYNDESMOSIS DISRUPTION RIGHT ANKLE;  Surgeon: Newt Minion, MD;  Location: [facility];  Service: Orthopedics;  Laterality: Right;   SHOULDER ARTHROSCOPY W/ ROTATOR CUFF REPAIR Right 2006; 2009   TONSILLECTOMY  1959   TOTAL ANKLE ARTHROPLASTY Right 08/18/2013   Procedure: TOTAL ANKLE ARTHOPLASTY;  Surgeon: Newt Minion, MD;  Location: [facility];  Service: Orthopedics;  Laterality: Right;  Right Total Ankle Arthroplasty, Revision Fibular Fracture   URETERAL STENT PLACEMENT  ~ 2010 X 2    Social History   Socioeconomic History   Marital status: Divorced    Spouse name: Not on file  Number of children: 6   Years of education: 14   Highest education level: Some college, no degree  Occupational History   Occupation: sales/ DJ    Comment: part time  Tobacco Use   Smoking status: Some Days    Types: Pipe, Cigars   Smokeless tobacco: Never   Tobacco comments:    cigar 1-2 times/[place]  Vaping Use   Vaping Use: Never used  Substance and Sexual Activity   Alcohol use: Yes    Comment: 1-2 drinks per year.   Drug use: No   Sexual activity: Yes  Other Topics Concern   Not on file  Social History Narrative   Lives with his son and his daughter visits often. He does Lobbyist, enjoys singing, music and yard work  (when he can).    Social Determinants of Health   Financial Resource Strain: Low Risk    Difficulty of Paying Living Expenses: Not hard at all  Food Insecurity: No Food Insecurity   Worried About Charity fundraiser in the Last Year: Never true   [place] in the Last Year: Never true  Transportation Needs: No Transportation Needs   Lack of Transportation (Medical): No   Lack of Transportation (Non-Medical): No  Physical Activity: Inactive   Days of Exercise per Week: 0 days   Minutes of Exercise per Session: 0 min  Stress: No Stress Concern Present   Feeling of Stress : Not at all  Social Connections: Moderately Isolated   Frequency of Communication with Friends and Family: More than three times a week   Frequency of Social Gatherings with Friends and Family: More than three times a week   Attends Religious Services: More than 4 times per year   Active Member of [employer] or Organizations: No   Attends Archivist Meetings: Never   Marital Status: Divorced    Family History  Problem Relation Age of Onset   Cancer Mother 95       Lung   Alzheimer's disease Father    Parkinson's disease Father    Scoliosis Sister    Cleft lip Sister     Health Maintenance  Topic Date Due   COVID-19 Vaccine (3 - Pfizer risk series) 12/08/2019   Zoster Vaccines- Shingrix (2 of 2) 07/05/2021   COLONOSCOPY (Pts 45-18yrs Insurance coverage will need to be confirmed)  06/03/2029   TETANUS/TDAP  05/11/2031   Pneumonia Vaccine 62+ Years old  Completed   INFLUENZA VACCINE  Completed   Hepatitis C Screening  Completed   HPV VACCINES  Aged Out     ----------------------------------------------------------------------------------------------------------------------------------------------------------------------------------------------------------------- Physical Exam BP (!) 164/98 (BP Location: Left Arm, Patient Position: Sitting, Cuff Size: Large)   Pulse 69   Ht 5\' 6"  (1.676 m)    Wt 267 lb (121.1 kg)   SpO2 95%   BMI 43.09 kg/m   Physical Exam Constitutional:      Appearance: Normal appearance.  HENT:     Head: Normocephalic and atraumatic.  Eyes:     General: No scleral icterus. Cardiovascular:     Rate and Rhythm: Normal rate and regular rhythm.  Pulmonary:     Effort: Pulmonary effort is normal.     Breath sounds: Normal breath sounds.  Musculoskeletal:     Cervical back: Neck supple.     Comments: Left leg swelling, significantly increased compared to right.  He does have mild tenderness along the posterior calf.  Neurological:     Mental Status: He is  alert. Mental status is at baseline.     Comments: Mild tremor noted at rest in upper and lower extremities.  Psychiatric:        Mood and Affect: Mood normal.        Behavior: Behavior normal.    ------------------------------------------------------------------------------------------------------------------------------------------------------------------------------------------------------------------- Assessment and Plan  Essential hypertension, benign Blood pressure elevated today.  Reports that blood pressures at home have been much better controlled.  Asked that he send readings of his blood pressures or bring these to his next testosterone injection.  Hypogonadism in male Update testosterone injection given today.  We will reduce Arimidex to 3 times weekly.  Parkinsonism [facility]) Management per neurology.  Stable at this time.  Left leg swelling Increased left leg swelling over the past couple of weeks.  We will repeat lower extremity ultrasound.  Encourage compression stocking use.   Meds ordered this encounter  Medications   DISCONTD: testosterone cypionate (DEPOTESTOSTERONE CYPIONATE) injection 200 mg    Return in about 3 months (around 08/31/2021) for Follow up/labs.    This visit occurred during the SARS-CoV-2 public health emergency.  Safety protocols were in place, including  screening questions prior to the visit, additional usage of staff PPE, and extensive cleaning of exam room while observing appropriate contact time as indicated for disinfecting solutions.

## 2021-06-03 NOTE — Assessment & Plan Note (Addendum)
Increased left leg swelling over the past couple of weeks.  We will repeat lower extremity ultrasound.  Encourage compression stocking use.

## 2021-06-03 NOTE — Assessment & Plan Note (Signed)
Update testosterone injection given today.  We will reduce Arimidex to 3 times weekly.

## 2021-06-15 ENCOUNTER — Other Ambulatory Visit: Payer: Self-pay | Admitting: Osteopathic Medicine

## 2021-06-21 ENCOUNTER — Other Ambulatory Visit: Payer: Self-pay

## 2021-06-21 ENCOUNTER — Ambulatory Visit (INDEPENDENT_AMBULATORY_CARE_PROVIDER_SITE_OTHER): Payer: Medicare HMO | Admitting: Family Medicine

## 2021-06-21 VITALS — BP 140/69 | HR 79 | Wt 265.0 lb

## 2021-06-21 DIAGNOSIS — E291 Testicular hypofunction: Secondary | ICD-10-CM

## 2021-06-21 NOTE — Progress Notes (Signed)
Established Patient Office Visit  Subjective:  Patient ID: Jonathan Yu, male    DOB: 26-Mar-1951  Age: 70 y.o. MRN: [id number]  CC:  Chief Complaint  Patient presents with   Hypogonadism    HPI Jonathan Yu is here for a testosterone injection. Denies chest pain, shortness of breath, headaches or mood changes.    Past Medical History:  Diagnosis Date   Allergy    DDD (degenerative disc disease), lumbar    Hyperlipidemia    "no meds since losing 106# 2 yr ago" (10/06/2013)   Hypertension 08/12/2010   "no meds since losing 106# 2 yr ago" (10/06/2013)   OSA on CPAP    "don't wear mask much since losing 106# 2 yr ago" (10/06/2013)   Parkinson's disease ([place])    Type II diabetes mellitus ([place])    "no meds since losing 106# 2 yr ago" (10/06/2013)   Walking pneumonia 08/12/1966    Past Surgical History:  Procedure Laterality Date   ANKLE FRACTURE SURGERY Left 1973   ANKLE RECONSTRUCTION Right 05/2011   CARPAL TUNNEL RELEASE Bilateral 2001   HARDWARE REMOVAL Right 10/07/2013   Procedure: RIGHT ANKLE HARDWARE REMOVAL;  Surgeon: Newt Minion, MD;  Location: [place];  Service: Orthopedics;  Laterality: Right;   I & D EXTREMITY Right 11/18/2013   Procedure: IRRIGATION AND DEBRIDEMENT EXTREMITY;  Surgeon: Newt Minion, MD;  Location: [facility];  Service: Orthopedics;  Laterality: Right;  Irrigation and Debridement Right Fibula , Place Antibiotic Beads and VAC   NASAL SEPTUM SURGERY  1982   ORIF ANKLE FRACTURE Right 09/08/2013   Procedure: REPAIR SYNDESMOSIS DISRUPTION RIGHT ANKLE;  Surgeon: Newt Minion, MD;  Location: [facility];  Service: Orthopedics;  Laterality: Right;   SHOULDER ARTHROSCOPY W/ ROTATOR CUFF REPAIR Right 2006; 2009   TONSILLECTOMY  1959   TOTAL ANKLE ARTHROPLASTY Right 08/18/2013   Procedure: TOTAL ANKLE ARTHOPLASTY;  Surgeon: Newt Minion, MD;  Location: [facility];  Service: Orthopedics;  Laterality: Right;  Right Total Ankle Arthroplasty, Revision Fibular Fracture   URETERAL  STENT PLACEMENT  ~ 2010 X 2    Family History  Problem Relation Age of Onset   Cancer Mother 62       Lung   Alzheimer's disease Father    Parkinson's disease Father    Scoliosis Sister    Cleft lip Sister     Social History   Socioeconomic History   Marital status: Divorced    Spouse name: Not on file   Number of children: 6   Years of education: 14   Highest education level: Some college, no degree  Occupational History   Occupation: sales/ DJ    Comment: part time  Tobacco Use   Smoking status: Some Days    Types: Pipe, Cigars   Smokeless tobacco: Never   Tobacco comments:    cigar 1-2 times/[place]  Vaping Use   Vaping Use: Never used  Substance and Sexual Activity   Alcohol use: Yes    Comment: 1-2 drinks per year.   Drug use: No   Sexual activity: Yes  Other Topics Concern   Not on file  Social History Narrative   Lives with his son and his daughter visits often. He does Lobbyist, enjoys singing, music and yard work (when he can).    Social Determinants of Health   Financial Resource Strain: Low Risk    Difficulty of Paying Living Expenses: Not hard at all  Food Insecurity: No Food Insecurity  Worried About Charity fundraiser in the Last Year: Never true   [place] in the Last Year: Never true  Transportation Needs: No Transportation Needs   Lack of Transportation (Medical): No   Lack of Transportation (Non-Medical): No  Physical Activity: Inactive   Days of Exercise per Week: 0 days   Minutes of Exercise per Session: 0 min  Stress: No Stress Concern Present   Feeling of Stress : Not at all  Social Connections: Moderately Isolated   Frequency of Communication with Friends and Family: More than three times a week   Frequency of Social Gatherings with Friends and Family: More than three times a week   Attends Religious Services: More than 4 times per year   Active Member of [employer] or Organizations: No   Attends Archivist Meetings: Never    Marital Status: Divorced  Human resources officer Violence: Not At Risk   Fear of Current or Ex-Partner: No   Emotionally Abused: No   Physically Abused: No   Sexually Abused: No    Outpatient Medications Prior to Visit  Medication Sig Dispense Refill   albuterol (PROVENTIL HFA;VENTOLIN HFA) 108 (90 Base) MCG/ACT inhaler Inhale 2 puffs into the lungs every 6 (six) hours as needed for wheezing or shortness of breath. 1 Inhaler 0   ALPRAZolam (XANAX) 0.5 MG tablet Take 0.5-1 tablets (0.25-0.5 mg total) by mouth at bedtime as needed for sleep. 20 tablet 3   AMBULATORY NON FORMULARY MEDICATION Continue CPAP at current settings.  Please provide new supplies. Micheal Likens phone number [phone] 1 each 0   AMBULATORY NON FORMULARY MEDICATION Compression stockings.  Vive size LGM Leg swelling. Disp 1 pair 99 refil. 1 each 99   anastrozole (ARIMIDEX) 1 MG tablet TAKE 1 TABLET EVERY DAY 90 tablet 1   atorvastatin (LIPITOR) 40 MG tablet Take 1 tablet (40 mg total) by mouth daily. 90 tablet 3   Azelaic Acid 15 % cream Apply 1 application topically 2 (two) times daily. 50 g 12   busPIRone (BUSPAR) 5 MG tablet Take 1 tablet (5 mg total) by mouth 2 (two) times daily as needed. 60 tablet 3   docusate sodium (COLACE) 50 MG capsule Take 50 mg by mouth daily.     fluticasone (FLONASE) 50 MCG/ACT nasal spray Place into both nostrils daily.     furosemide (LASIX) 20 MG tablet Take 1 tablet (20 mg total) by mouth daily as needed for edema. 30 tablet 3   loratadine (CLARITIN) 10 MG tablet Take 10 mg by mouth daily.     losartan (COZAAR) 50 MG tablet TAKE 1 TABLET EVERY DAY 90 tablet 3   Misc. Devices (FLEX THERAPY) MISC by Does not apply route.     Multiple Vitamin (MULTIVITAMIN WITH MINERALS) TABS tablet Take 1 tablet by mouth daily.     Probiotic Product (PROBIOTIC PO) Take by mouth daily.     rotigotine (NEUPRO) 4 MG/24HR Place 1 patch onto the skin daily. 90 patch 1   tadalafil (CIALIS) 20 MG tablet  TAKE 1/2 TO 1 (ONE-HALF TO ONE) TABLET BY MOUTH EVERY OTHER DAY AS NEEDED FOR ERECTILE DYSFUNCTION 30 tablet 0   tamsulosin (FLOMAX) 0.4 MG CAPS capsule Take 1 capsule (0.4 mg total) by mouth daily after breakfast. 90 capsule 3   VITAMIN D PO Take by mouth.     Facility-Administered Medications Prior to Visit  Medication Dose Route Frequency Provider Last Rate Last Admin   testosterone cypionate (DEPOTESTOSTERONE CYPIONATE) injection  200 mg  200 mg Intramuscular Q21 days Luetta Nutting, DO   200 mg at 06/21/21 1011    Allergies  Allergen Reactions   Sesame Oil Hives and Shortness Of Breath   Clonazepam Hives   Tape Other (See Comments)    White surgical tape caused some blisters.   Percocet [Oxycodone-Acetaminophen] Rash   Topamax [Topiramate]     Cognitive decline   Vicodin [Hydrocodone-Acetaminophen] Rash    ROS Review of Systems    Objective:    Physical Exam  BP 140/69   Pulse 79   Wt 265 lb (120.2 kg)   SpO2 96%   BMI 42.77 kg/m  Wt Readings from Last 3 Encounters:  06/21/21 265 lb (120.2 kg)  05/31/21 267 lb (121.1 kg)  05/10/21 264 lb (119.7 kg)     Health Maintenance Due  Topic Date Due   COVID-19 Vaccine (3 - Pfizer risk series) 12/08/2019    There are no preventive care reminders to display for this patient.  Lab Results  Component Value Date   TSH 3.21 03/16/2020   Lab Results  Component Value Date   WBC 7.7 09/07/2020   HGB 18.1 (H) 09/07/2020   HCT 52.4 (H) 09/07/2020   MCV 89.3 09/07/2020   PLT 201 09/07/2020   Lab Results  Component Value Date   NA 137 03/16/2020   K 4.2 03/16/2020   CO2 29 03/16/2020   GLUCOSE 122 (H) 03/16/2020   BUN 12 03/16/2020   CREATININE 1.03 03/16/2020   BILITOT 2.2 (H) 03/16/2020   ALKPHOS 39 (L) 01/07/2017   AST 21 03/16/2020   ALT 30 03/16/2020   PROT 6.9 03/16/2020   ALBUMIN 3.5 (L) 01/07/2017   CALCIUM 9.3 03/16/2020   ANIONGAP 11 07/31/2019   Lab Results  Component Value Date   CHOL 197  03/16/2020   Lab Results  Component Value Date   HDL 35 (L) 03/16/2020   Lab Results  Component Value Date   LDLCALC 126 (H) 03/16/2020   Lab Results  Component Value Date   TRIG 224 (H) 03/16/2020   Lab Results  Component Value Date   CHOLHDL 5.6 (H) 03/16/2020   Lab Results  Component Value Date   HGBA1C 6.0 (H) 03/16/2020      Assessment & Plan:  Hypogonadism - Patient tolerated injection well without complications. Patient advised to schedule next injection 21 days from today.    Problem List Items Addressed This Visit     Hypogonadism in male - Primary    No orders of the defined types were placed in this encounter.   Follow-up: Return in about 3 weeks (around 07/12/2021) for Testosterone injection. Durene Romans, Monico Blitz, [place]

## 2021-06-21 NOTE — Progress Notes (Signed)
Medical screening examination/treatment was performed by qualified clinical staff member and as supervising physician I was immediately available for consultation/collaboration. I have reviewed documentation and agree with assessment and plan.  Dagan Heinz, DO  

## 2021-06-22 ENCOUNTER — Other Ambulatory Visit: Payer: Self-pay | Admitting: Family Medicine

## 2021-06-25 ENCOUNTER — Other Ambulatory Visit: Payer: Self-pay | Admitting: Neurology

## 2021-06-25 ENCOUNTER — Other Ambulatory Visit: Payer: Self-pay | Admitting: Osteopathic Medicine

## 2021-06-29 ENCOUNTER — Encounter: Payer: Self-pay | Admitting: Neurology

## 2021-06-30 ENCOUNTER — Other Ambulatory Visit: Payer: Self-pay | Admitting: Neurology

## 2021-07-02 MED ORDER — NEUPRO 4 MG/24HR TD PT24
1.0000 | MEDICATED_PATCH | Freq: Every day | TRANSDERMAL | 1 refills | Status: DC
Start: 2021-07-02 — End: 2021-09-13

## 2021-07-12 ENCOUNTER — Ambulatory Visit (INDEPENDENT_AMBULATORY_CARE_PROVIDER_SITE_OTHER): Payer: Medicare HMO | Admitting: Family Medicine

## 2021-07-12 ENCOUNTER — Other Ambulatory Visit: Payer: Self-pay

## 2021-07-12 ENCOUNTER — Telehealth: Payer: Self-pay

## 2021-07-12 VITALS — BP 142/72 | HR 72 | Temp 98.2°F

## 2021-07-12 DIAGNOSIS — E291 Testicular hypofunction: Secondary | ICD-10-CM

## 2021-07-12 DIAGNOSIS — G4733 Obstructive sleep apnea (adult) (pediatric): Secondary | ICD-10-CM

## 2021-07-12 MED ORDER — TESTOSTERONE CYPIONATE 200 MG/ML IM SOLN
200.0000 mg | Freq: Once | INTRAMUSCULAR | Status: AC
  Administered 2021-07-12: 200 mg via INTRAMUSCULAR

## 2021-07-12 NOTE — Telephone Encounter (Signed)
Pt states that at his last visit he asked about decreasing the anastrozole (ARIMIDEX) 1 MG tablet because he felt things were going well. He decreased to taking it TIW instead of daily and since decreasing the Rx his anxiety has gotten worse. He would like to return to taking the Rx daily.   Pt also said that he needed a Rx for his CPAP settings only. He got a Rx for a new machine with the same settings by Dr. Georgina Snell on 06/03/2019. After getting the new machine, there was a recall. When that happened he got his old machine out and started using it. He said that he needs a Rx for the pressure setting of 11-13 but does not need a Rx for a new machine or supplies. Noted in his OV with Dr. Georgina Snell on 06/03/2019, pt said his last sleep study was done around 2011. He had the study done through [facility], in which that office has closed down and we do not have a copy of the sleep study results. His OSA on CPAP was discussed during his OV on 10/14/2019 in which he was told to continue use at same pressures. Pt uses [employer] as his DME company. I told the pt that he may need to have a new sleep study done because his pressure settings have most likely changed from his last sleep study.

## 2021-07-12 NOTE — Progress Notes (Signed)
Patient presents today for testosterone cypionate 200 mg/1 ml injection. Patient is scheduled to get this injection every 21  days. Patients last injection was given on 06/21/2021 in the [facility].   Patient denies CP, palpitations, ShOB, abd pain, headache, mood swings.   Injection given in [facility]. Pt tolerated injection well without complications. Pt instructed to stop at the front desk to schedule a nurse visit for his next injection that will be due in 21 days.

## 2021-07-12 NOTE — Patient Instructions (Signed)
Testosterone Injection What is this medication? TESTOSTERONE (tes TOS ter one) is used to increase testosterone levels in your body. It belongs to a group of medications called androgen hormones. This medicine may be used for other purposes; ask your health care provider or pharmacist if you have questions. COMMON BRAND NAME(S): Andro-L.A., Aveed, Delatestryl, Depo-Testosterone, Virilon What should I tell my care team before I take this medication? They need to know if you have any of these conditions: Cancer Diabetes Heart disease Kidney disease Liver disease Lung disease Prostate disease An unusual or allergic reaction to testosterone, other medications, foods, dyes, or preservatives If a male partner is pregnant or trying to get pregnant Breast-feeding How should I use this medication? This medication is for injection into a muscle. It is usually given in a hospital or clinic. A special MedGuide will be given to you by the pharmacist with each prescription and refill. Be sure to read this information carefully each time. Contact your care team regarding the use of this medication in children. While this medication may be prescribed for children as young as 36 years of age for selected conditions, precautions do apply. Overdosage: If you think you have taken too much of this medicine contact a poison control center or emergency room at once. NOTE: This medicine is only for you. Do not share this medicine with others. What if I miss a dose? Try not to miss a dose. Your care team will tell you when your next injection is due. Notify the office if you are unable to keep an appointment. What may interact with this medication? Medications for diabetes Medications that treat or prevent blood clots like warfarin Oxyphenbutazone Propranolol Steroid medications like prednisone or cortisone This list may not describe all possible interactions. Give your health care provider a list of all the  medicines, herbs, non-prescription drugs, or dietary supplements you use. Also tell them if you smoke, drink alcohol, or use illegal drugs. Some items may interact with your medicine. What should I watch for while using this medication? Visit your care team for regular checks on your progress. They will need to check the level of testosterone in your blood. This medication is only approved for use in men who have low levels of testosterone related to certain medical conditions. Heart attacks and strokes have been reported with the use of this medication. Notify your care team and seek emergency treatment if you develop breathing problems; changes in vision; confusion; chest pain or chest tightness; sudden arm pain; severe, sudden headache; trouble speaking or understanding; sudden numbness or weakness of the face, arm or leg; loss of balance or coordination. Talk to your care team about the risks and benefits of this medication. This medication may affect blood sugar levels. If you have diabetes, check with your care team before you change your diet or the dose of your diabetic medication. Testosterone injections are not commonly used in women. Women should inform their care team if they wish to become pregnant or think they might be pregnant. There is a potential for serious side effects to an unborn child. Talk to your care team or pharmacist for more information. Talk with your care team about your birth control options while taking this medication. This medication is banned from use in athletes by most athletic organizations. What side effects may I notice from receiving this medication? Side effects that you should report to your care team as soon as possible: Allergic reactions--skin rash, itching, hives, swelling of the  face, lips, tongue, or throat Blood clot--pain, swelling, or warmth in the leg, shortness of breath, chest pain Heart attack--pain or tightness in the chest, shoulders, arms or jaw,  nausea, shortness of breath, cold or clammy skin, feeling faint or lightheaded Increase in blood pressure Liver injury--right upper belly pain, loss of appetite, nausea, light-colored stool, dark yellow or brown urine, yellowing of the skin or eyes, unusual weakness or fatigue Mood swings, irritability, or hostility Prolonged or painful erection Sleep apnea--loud snoring, gasping during sleep, daytime sleepiness Stroke--sudden numbness or weakness of the face, arm or leg, trouble speaking, confusion, trouble walking, loss of balance or coordination, dizziness, severe headache, change in vision Swelling of the ankles, hands, or feet Thoughts of suicide or self-harm, worsening mood, feelings of depression Side effects that usually do not require medical attention (report to your care team if they continue or are bothersome): Acne Change in sex drive or performance Pain, redness, or irritation at the application site Unexpected breast tissue growth This list may not describe all possible side effects. Call your doctor for medical advice about side effects. You may report side effects to FDA at 1-800-FDA-1088. Where should I keep my medication? Keep out of the reach of children. This medication can be abused. Keep your medication in a safe place to protect it from theft. Do not share this medication with anyone. Selling or giving away this medication is dangerous and against the law. Store at room temperature between 20 and 25 degrees C (68 and 77 degrees F). Do not freeze. Protect from light. Follow the directions for the product you are prescribed. Throw away any unused medication after the expiration date. NOTE: This sheet is a summary. It may not cover all possible information. If you have questions about this medicine, talk to your doctor, pharmacist, or health care provider.  2022 [employer] (2020-07-26 00:00:00)

## 2021-07-17 ENCOUNTER — Other Ambulatory Visit: Payer: Self-pay | Admitting: Family Medicine

## 2021-07-17 DIAGNOSIS — N400 Enlarged prostate without lower urinary tract symptoms: Secondary | ICD-10-CM

## 2021-07-17 MED ORDER — ANASTROZOLE 1 MG PO TABS: 1.0000 mg | ORAL_TABLET | Freq: Every day | ORAL | 1 refills | Status: DC

## 2021-07-17 NOTE — Telephone Encounter (Signed)
Ok to increase back to daily use.  Recommend updated sleep study to be sure that he is on appropriate settings. Orders entered.

## 2021-07-18 NOTE — Telephone Encounter (Signed)
LVM advising patient of Dr. Zigmund Daniel comments and recommendations. Callback information provided for questions.

## 2021-07-24 ENCOUNTER — Encounter: Payer: Self-pay | Admitting: Neurology

## 2021-07-25 ENCOUNTER — Ambulatory Visit: Payer: Medicare HMO | Admitting: Neurology

## 2021-07-25 ENCOUNTER — Encounter: Payer: Self-pay | Admitting: Neurology

## 2021-07-25 VITALS — BP 149/84 | HR 74 | Ht 66.0 in | Wt 268.0 lb

## 2021-07-25 DIAGNOSIS — Z9989 Dependence on other enabling machines and devices: Secondary | ICD-10-CM

## 2021-07-25 DIAGNOSIS — G20 Parkinson's disease: Secondary | ICD-10-CM

## 2021-07-25 DIAGNOSIS — F419 Anxiety disorder, unspecified: Secondary | ICD-10-CM

## 2021-07-25 DIAGNOSIS — G4733 Obstructive sleep apnea (adult) (pediatric): Secondary | ICD-10-CM

## 2021-07-25 MED ORDER — ROPINIROLE HCL 0.5 MG PO TABS: 1.5000 mg | ORAL_TABLET | Freq: Three times a day (TID) | ORAL | 3 refills | Status: DC

## 2021-07-25 MED ORDER — BUSPIRONE HCL 5 MG PO TABS: 5.0000 mg | ORAL_TABLET | Freq: Three times a day (TID) | ORAL | 1 refills | Status: DC | PRN

## 2021-07-25 NOTE — Patient Instructions (Signed)
°  It was nice to see you again today.  We will keep your appointment for February to talk about your sleep apnea, continue using your current CPAP machine for now. As discussed, we will likely have to switch you over to ropinirole or another medication and you have the Neupro patch due to cost and insurance coverage.  Once you are able to fill the new prescription, stop the patch and start Requip (generic name: ropinirole) 0.5 mg: Take one pill 3 times daily for one week, then 2 pills 3 times a day for one week, then 3 pills 3 times a day thereafter. Common side effects reported are: Sedation, sleepiness, nausea, vomiting, and rare side effects are confusion, hallucinations, swelling in legs, and abnormal behaviors, including impulse control problems, which can manifest as excessive eating, obsessions with food or gambling, or hypersexuality. You can go on the [facility] neurology website or [employer] website for neurology/movement disorders to see if they are offering any clinical trials for Parkinson's patients.

## 2021-07-25 NOTE — Progress Notes (Signed)
Subjective:    Patient ID: Jonathan Yu is a 70 y.o. male.  HPI    Interim history:   Jonathan Yu is a 70 year old right-handed gentleman with an underlying medical history of degenerative disc disease, hyperlipidemia, hypertension, diabetes, vitamin D deficiency, obstructive sleep apnea (on PAP therapy), arthritis, status post multiple surgeries allergies, low testosterone, and severe obesity with a BMI of over 40, who presents for follow-up consultation of his parkinsonism. The patient is unaccompanied today.  I last saw him on 03/26/2021, at which time he reported that Neupro had helped.  He was able to tolerate the patch.  He was motivated to start exercising regularly.  The Neupro was quite expensive.  He was advised to continue with Neupro patch 4 mg once daily and monitor his lower extremity swelling.  For his anxiety, I suggested he start BuSpar 5 mg strength as needed.  Today, 07/25/2021: He reports that he got a letter from his insurance regarding the Neupro patch, it will not be covered by the first of the year.  He just recently filled a 28-month supply so he has enough patches to look March or 7.  BuSpar has been helpful for his anxiety, he takes up to 1 pill twice daily of the 5 mg strength, sometimes he feels he may need more.  He does not want to take it every day for fear of getting addicted or tolerant to it.  He is trying to exercise, he recently had a vibration toward online yesterday.  He is interested in participating in any trials for Parkinson's disease.  He has not fallen, his swelling is a little bit better, primary care has given him a prescription for furosemide as needed he needs help to get compression stockings on.  He has an appointment to discuss his sleep apnea.  He was diagnosed 10 to 12 years ago.  He uses a old CPAP machine from [employer].  I was able to review his compliance data from 06/25/2021 through 07/24/2021 which is a total of 30 days, during which  time he used his machine every night with percent use days greater than 4 hours at 100%, average usage of 7 hours and 38 minutes, residual AHI at goal at 4.1/h, pressure at 13 cm, leak on the low side.  Ramp time is 20 minutes, ramp start pressure is 12 cm.  The patient's allergies, current medications, family history, past medical history, past social history, past surgical history and problem list were reviewed and updated as appropriate.    Previously:    I saw him on 12/28/2020, at which time I suggested he start Neupro patch.  We talked about his DaTscan results supporting Parkinson's disease.        I first met him on 11/22/2020 at the request of his primary care physician, at which time the patient gave an approximately 2-year history of tremors affecting primarily his right lower and upper extremities.  His presentation was concerning for parkinsonism.  He was advised to proceed with a nuclear medicine DaTscan.   He had a DaTscan on 12/26/2020 and I reviewed the results:  IMPRESSION: Marked decreased activity within the bilateral posterior striatum is a pattern typical of Parkinsonian syndrome pathology.   Of note, DaTSCAN is not diagnostic of Parkinsonian syndromes, which remains a clinical diagnosis. DaTscan is an adjuvant test to aid in the clinical diagnosis of Parkinsonian syndromes.   We called him with his test results.     11/22/20: (He) reports an approximately  2-year history of tremors affecting primarily the right side.  He first noticed right foot tremor.  His tremor has progressed, sometimes his left side feels worse, he has also noticed fine motor dyscontrol and difficulty with movement in general, feels fatigued.  His symptoms got worse when he had Covid some 18 months ago and also reports that his anxiety has become much more noticeable.  Previously, he was not an anxious person, now he is often anxious.  He does have a prescription for Xanax but rarely uses it.  He has  not fallen recently.  He tries to stay active.  He has multiple joint related issues including right ankle replacement, left ankle injury and repair some 50 years ago, no hardware in place but right ankle replacement some 10 years ago after an accident.  He quit working after that.  He also has right knee pain and bilateral rotator cuff problems, status post surgeries twice on the right, no surgery on the left but significant decrease in range of motion in the left shoulder.  He works with a physical therapist about once a week.  He has not been exercising as such on a regular basis.  He is generally concerned about his constellation of symptoms such as tremors, mobility issues, problems with fatigue and overall deconditioning.  He is divorced and lives with his youngest daughter who is 21 and his son who is 62, and divorced.  He has a total of 6 children.  He himself is the oldest of 5 kids, had 2 sisters and 2 brothers, younger sister passed away.  His father had Parkinson's disease later in life, lived to be 44, mom died from lung cancer at 6.  He does not smoke cigarettes.  He does occasionally smoke a cigar but none in a year approximately.  He drinks alcohol rarely.  He does not drink caffeine on a day-to-day basis.   He uses his CPAP faithfully.  He is retired from working with [employer] for many years.  He does have a remote history of dream enactment behaviors.  These have been infrequently now.  He fell out of bed some 3 years ago as he was dreaming about fighting somebody.  He does have a history of sleep talking.   I reviewed your office note from 05/14/2021. He had blood work through your office on 09/07/2020 and I reviewed the results, vitamin D was mildly low at 27.  His TSH was normal in August 2021.  His B12 was 407 on 03/16/2020.  A1c was 6.0. His Past Medical History Is Significant For: Past Medical History:  Diagnosis Date   Allergy    DDD (degenerative disc disease), lumbar     Hyperlipidemia    "no meds since losing 106# 2 yr ago" (10/06/2013)   Hypertension 08/12/2010   "no meds since losing 106# 2 yr ago" (10/06/2013)   OSA on CPAP    "don't wear mask much since losing 106# 2 yr ago" (10/06/2013)   Parkinson's disease ([place])    Type II diabetes mellitus ([place])    "no meds since losing 106# 2 yr ago" (10/06/2013)   Walking pneumonia 08/12/1966    His Past Surgical History Is Significant For: Past Surgical History:  Procedure Laterality Date   ANKLE FRACTURE SURGERY Left 1973   ANKLE RECONSTRUCTION Right 05/2011   CARPAL TUNNEL RELEASE Bilateral 2001   HARDWARE REMOVAL Right 10/07/2013   Procedure: RIGHT ANKLE HARDWARE REMOVAL;  Surgeon: Newt Minion, MD;  Location: [facility]  OR;  Service: Orthopedics;  Laterality: Right;   I & D EXTREMITY Right 11/18/2013   Procedure: IRRIGATION AND DEBRIDEMENT EXTREMITY;  Surgeon: Newt Minion, MD;  Location: [place];  Service: Orthopedics;  Laterality: Right;  Irrigation and Debridement Right Fibula , Place Antibiotic Beads and VAC   NASAL SEPTUM SURGERY  1982   ORIF ANKLE FRACTURE Right 09/08/2013   Procedure: REPAIR SYNDESMOSIS DISRUPTION RIGHT ANKLE;  Surgeon: Newt Minion, MD;  Location: [place];  Service: Orthopedics;  Laterality: Right;   SHOULDER ARTHROSCOPY W/ ROTATOR CUFF REPAIR Right 2006; 2009   TONSILLECTOMY  1959   TOTAL ANKLE ARTHROPLASTY Right 08/18/2013   Procedure: TOTAL ANKLE ARTHOPLASTY;  Surgeon: Newt Minion, MD;  Location: [place];  Service: Orthopedics;  Laterality: Right;  Right Total Ankle Arthroplasty, Revision Fibular Fracture   URETERAL STENT PLACEMENT  ~ 2010 X 2    His Family History Is Significant For: Family History  Problem Relation Age of Onset   Cancer Mother 40       Lung   Alzheimer's disease Father    Parkinson's disease Father    Scoliosis Sister    Cleft lip Sister     His Social History Is Significant For: Social History   Socioeconomic History   Marital status: Divorced    Spouse  name: Not on file   Number of children: 6   Years of education: 14   Highest education level: Some college, no degree  Occupational History   Occupation: sales/ DJ    Comment: part time  Tobacco Use   Smoking status: Some Days    Types: Pipe, Cigars   Smokeless tobacco: Never   Tobacco comments:    cigar 1-2 times/[place]  Vaping Use   Vaping Use: Never used  Substance and Sexual Activity   Alcohol use: Yes    Comment: 1-2 drinks per year.   Drug use: No   Sexual activity: Yes  Other Topics Concern   Not on file  Social History Narrative   Lives with his son and his daughter visits often. He does Lobbyist, enjoys singing, music and yard work (when he can).    Social Determinants of Health   Financial Resource Strain: Low Risk    Difficulty of Paying Living Expenses: Not hard at all  Food Insecurity: No Food Insecurity   Worried About Charity fundraiser in the Last Year: Never true   [place] in the Last Year: Never true  Transportation Needs: No Transportation Needs   Lack of Transportation (Medical): No   Lack of Transportation (Non-Medical): No  Physical Activity: Inactive   Days of Exercise per Week: 0 days   Minutes of Exercise per Session: 0 min  Stress: No Stress Concern Present   Feeling of Stress : Not at all  Social Connections: Moderately Isolated   Frequency of Communication with Friends and Family: More than three times a week   Frequency of Social Gatherings with Friends and Family: More than three times a week   Attends Religious Services: More than 4 times per year   Active Member of [employer] or Organizations: No   Attends Archivist Meetings: Never   Marital Status: Divorced    His Allergies Are:  Allergies  Allergen Reactions   Sesame Oil Hives and Shortness Of Breath   Clonazepam Hives   Tape Other (See Comments)    White surgical tape caused some blisters.   Percocet [Oxycodone-Acetaminophen] Rash  Topamax [Topiramate]      Cognitive decline   Vicodin [Hydrocodone-Acetaminophen] Rash  :   His Current Medications Are:  Outpatient Encounter Medications as of 07/25/2021  Medication Sig   albuterol (PROVENTIL HFA;VENTOLIN HFA) 108 (90 Base) MCG/ACT inhaler Inhale 2 puffs into the lungs every 6 (six) hours as needed for wheezing or shortness of breath.   ALPRAZolam (XANAX) 0.5 MG tablet Take 0.5-1 tablets (0.25-0.5 mg total) by mouth at bedtime as needed for sleep.   AMBULATORY NON FORMULARY MEDICATION Continue CPAP at current settings.  Please provide new supplies. Micheal Likens phone number [phone]   AMBULATORY NON FORMULARY MEDICATION Compression stockings.  Vive size LGM Leg swelling. Disp 1 pair 99 refil.   anastrozole (ARIMIDEX) 1 MG tablet Take 1 tablet (1 mg total) by mouth daily.   atorvastatin (LIPITOR) 40 MG tablet Take 1 tablet (40 mg total) by mouth daily.   Azelaic Acid 15 % cream Apply 1 application topically 2 (two) times daily.   busPIRone (BUSPAR) 5 MG tablet Take 1 tablet (5 mg total) by mouth 2 (two) times daily as needed.   docusate sodium (COLACE) 50 MG capsule Take 50 mg by mouth daily.   fluticasone (FLONASE) 50 MCG/ACT nasal spray Place into both nostrils daily.   furosemide (LASIX) 20 MG tablet Take 1 tablet (20 mg total) by mouth daily as needed for edema.   loratadine (CLARITIN) 10 MG tablet Take 10 mg by mouth daily.   losartan (COZAAR) 50 MG tablet TAKE 1 TABLET EVERY DAY   Misc. Devices (FLEX THERAPY) MISC by Does not apply route.   Multiple Vitamin (MULTIVITAMIN WITH MINERALS) TABS tablet Take 1 tablet by mouth daily.   Probiotic Product (PROBIOTIC PO) Take by mouth daily.   rotigotine (NEUPRO) 4 MG/24HR Place 1 patch onto the skin daily.   tadalafil (CIALIS) 20 MG tablet TAKE 1/2 TO 1 (ONE-HALF TO ONE) TABLET BY MOUTH EVERY OTHER DAY FOR ERECTILE DYSFUNCTION   tamsulosin (FLOMAX) 0.4 MG CAPS capsule TAKE 1 CAPSULE BY MOUTH DAILY AFTER BREAKFAST.   VITAMIN D PO Take by  mouth.   Facility-Administered Encounter Medications as of 07/25/2021  Medication   testosterone cypionate (DEPOTESTOSTERONE CYPIONATE) injection 200 mg  :  Review of Systems:  Out of a complete 14 point review of systems, all are reviewed and negative with the exception of these symptoms as listed below:  Review of Systems  Neurological:        Pt is here for parkinson's follow up . Pt has new questions therapies and drug trial for his parkinson's Pt states his insurance is dropping Neupro and they will cover pramipexole or ropinirole. Change will happened next year . Pt states he does have enough neupro  for the next three months    Objective:  Neurological Exam  Physical Exam Physical Examination:   Vitals:   07/25/21 1101  BP: (!) 149/84  Pulse: 74    General Examination: The patient is a very pleasant 70 y.o. male in no acute distress. He appears well-developed and well-nourished and well groomed.   HEENT: Normocephalic, atraumatic, pupils are equal, round and reactive to light, extraocular tracking is generally preserved, mild to moderate decrease in eye blink rate noted and mild to moderate facial masking noted.  Dry eyes bilaterally.  Mild nuchal rigidity noted, no lip, neck or jaw tremor, no carotid bruits.  Airway examination reveals mild mouth dryness, tongue protrudes centrally and palate elevates symmetrically.  Moderate airway crowding.  Thicker tongue noted.  No significant hypophonia, no dysarthria noted.    Chest: Clear to auscultation without wheezing, rhonchi or crackles noted.   Heart: S1+S2+0, regular and normal without murmurs, rubs or gallops noted.    Abdomen: Soft, non-tender and non-distended.       Extremities: There is 1+ pitting edema in both lower extremities, more so on the left side.  Left leg larger in caliber than right.     Skin: Warm and dry without trophic changes noted.    Musculoskeletal: exam reveals decreased range of motion in both  shoulders, right shoulder is higher than left.  Scar left ankle and distal leg, scar right ankle.      Neurologically:  Mental status: The patient is awake, alert and oriented in all 4 spheres. His immediate and remote memory, attention, language skills and fund of knowledge are appropriate. There is no evidence of aphasia, agnosia, apraxia or anomia. Mood is normal and affect is normal.  Cranial nerves II - XII are as described above under HEENT exam.  Unequalshoulder height noted.   Motor exam: Normal bulk, mild increase in tone in the right more than left upper extremity, no telltale cogwheeling.  Mild bradykinesia.  He has a mild intermittent resting tremor in the right upper and right lower extremity.  He has no significant postural or action tremor. No left sided resting tremor.    (On 11/22/2020: On Archimedes spiral drawing he has no  significant trembling with the right hand which is his dominant hand, insecurity noted with the left hand but no trembling as such, handwriting is on the smaller side, legible.)     Fine motor skills shows mild to moderate impairment on the right side, slightly better on the left.   Cerebellar testing: No dysmetria or intention tremor on finger to nose testing. Heel to shin is unremarkable bilaterally. There is no truncal or gait ataxia.  Sensory exam: intact to light touch in the upper and lower extremities.  Gait, station and balance: He stands with mild difficulty, pushes himself up, posture is age-appropriate to slightly stooped for age.  He walks with no telltale shuffling but decreased arm swing on the right.  He walks slightly slowly, turns slowly.    Assessment and Plan:    In summary, Jonathan Yu is a very pleasant 70 year old male with an underlying medical history of degenerative disc disease, hyperlipidemia, hypertension, diabetes, vitamin D deficiency, obstructive sleep apnea on CPAP therapy, arthritis, status post multiple surgeries allergies,  low testosterone, and severe obesity with a BMI of over 40, who presents for follow-up consultation of his parkinsonism. He had a DaTscan on 12/26/2020 which showed marked decreased activity within the bilateral posterior striatum, and was, as such supportive of an underlying parkinsonian syndrome.  His history and presentation are in keeping with right-sided parkinsonism, likely right-sided predominant Parkinson's disease, no obvious atypical features at this time.  He started Neupro patch in May 2022 with good tolerance.  He was able to increase it to 4 mg once daily. Unfortunately, his insurance will no longer cover this after this year.  We talked about alternative treatment options and I suggested switching over to ropinirole 0.5 mg strength 1 pill 3 times daily, increase this incrementally every week to up to 3 pills 3 times daily for now.  He can start this as soon as he likes, as soon as he can fill the ropinirole.  We will talk about his sleep apnea in a little bit more detail at the  next visit.  He is advised to continue to pursue a healthy lifestyle and work on exercise and weight loss.  He is advised to monitor his swelling and ropinirole can potentially make edema worse.  He is advised to go to the [place] and [facility] neurology websites to see if they are offering any clinical studies for Parkinson's patients.  He would be interested in participation.   He was started on BuSpar in August 2022 for his anxiety, it has been helpful.  He is wondering if he could increase this.  We can increase the BuSpar to up to 3 times daily as needed.  He is on the 5 mg strength and I adjusted the prescription.  We will plan a follow-up as scheduled in February 2022 and focus of her visit on his sleep apnea at the time.  I answered all his questions today and he was in agreement. I spent 40 minutes in total face-to-face time and in reviewing records during pre-charting, more than 50% of which was spent in counseling  and coordination of care, reviewing test results, reviewing medications and treatment regimen and/or in discussing or reviewing the diagnosis of PD, OSA, the prognosis and treatment options. Pertinent laboratory and imaging test results that were available during this visit with the patient were reviewed by me and considered in my medical decision making (see chart for details).

## 2021-08-01 ENCOUNTER — Ambulatory Visit (INDEPENDENT_AMBULATORY_CARE_PROVIDER_SITE_OTHER): Payer: Medicare HMO | Admitting: Family Medicine

## 2021-08-01 ENCOUNTER — Other Ambulatory Visit: Payer: Self-pay

## 2021-08-01 VITALS — BP 145/67 | HR 65 | Temp 98.2°F

## 2021-08-01 DIAGNOSIS — E291 Testicular hypofunction: Secondary | ICD-10-CM | POA: Diagnosis not present

## 2021-08-01 MED ORDER — TESTOSTERONE CYPIONATE 200 MG/ML IM SOLN
200.0000 mg | Freq: Once | INTRAMUSCULAR | Status: AC
  Administered 2021-08-01: 15:00:00 200 mg via INTRAMUSCULAR

## 2021-08-01 NOTE — Progress Notes (Signed)
Patient presents today for testosterone cypionate 200 mg/1 ml injection. Patient is scheduled to get this injection every 3 weeks. Patients last injection was given on 07/12/2021 in the [facility].   Patient denies CP, palpitations, ShOB, abd pain, headache, mood swings.   Injection given in [facility]. Pt tolerated injection well without complications. Pt instructed to stop at the front desk to schedule a nurse visit for his next injection that will be due in 3 weeks.

## 2021-08-01 NOTE — Patient Instructions (Signed)
Testosterone Injection What is this medication? TESTOSTERONE (tes TOS ter one) is used to increase testosterone levels in your body. It belongs to a group of medications called androgen hormones. This medicine may be used for other purposes; ask your health care provider or pharmacist if you have questions. COMMON BRAND NAME(S): Andro-L.A., Aveed, Delatestryl, Depo-Testosterone, Virilon What should I tell my care team before I take this medication? They need to know if you have any of these conditions: Cancer Diabetes Heart disease Kidney disease Liver disease Lung disease Prostate disease An unusual or allergic reaction to testosterone, other medications, foods, dyes, or preservatives If a male partner is pregnant or trying to get pregnant Breast-feeding How should I use this medication? This medication is for injection into a muscle. It is usually given in a hospital or clinic. A special MedGuide will be given to you by the pharmacist with each prescription and refill. Be sure to read this information carefully each time. Contact your care team regarding the use of this medication in children. While this medication may be prescribed for children as young as 63 years of age for selected conditions, precautions do apply. Overdosage: If you think you have taken too much of this medicine contact a poison control center or emergency room at once. NOTE: This medicine is only for you. Do not share this medicine with others. What if I miss a dose? Try not to miss a dose. Your care team will tell you when your next injection is due. Notify the office if you are unable to keep an appointment. What may interact with this medication? Medications for diabetes Medications that treat or prevent blood clots like warfarin Oxyphenbutazone Propranolol Steroid medications like prednisone or cortisone This list may not describe all possible interactions. Give your health care provider a list of all the  medicines, herbs, non-prescription drugs, or dietary supplements you use. Also tell them if you smoke, drink alcohol, or use illegal drugs. Some items may interact with your medicine. What should I watch for while using this medication? Visit your care team for regular checks on your progress. They will need to check the level of testosterone in your blood. This medication is only approved for use in men who have low levels of testosterone related to certain medical conditions. Heart attacks and strokes have been reported with the use of this medication. Notify your care team and seek emergency treatment if you develop breathing problems; changes in vision; confusion; chest pain or chest tightness; sudden arm pain; severe, sudden headache; trouble speaking or understanding; sudden numbness or weakness of the face, arm or leg; loss of balance or coordination. Talk to your care team about the risks and benefits of this medication. This medication may affect blood sugar levels. If you have diabetes, check with your care team before you change your diet or the dose of your diabetic medication. Testosterone injections are not commonly used in women. Women should inform their care team if they wish to become pregnant or think they might be pregnant. There is a potential for serious side effects to an unborn child. Talk to your care team or pharmacist for more information. Talk with your care team about your birth control options while taking this medication. This medication is banned from use in athletes by most athletic organizations. What side effects may I notice from receiving this medication? Side effects that you should report to your care team as soon as possible: Allergic reactions--skin rash, itching, hives, swelling of the  face, lips, tongue, or throat Blood clot--pain, swelling, or warmth in the leg, shortness of breath, chest pain Heart attack--pain or tightness in the chest, shoulders, arms or jaw,  nausea, shortness of breath, cold or clammy skin, feeling faint or lightheaded Increase in blood pressure Liver injury--right upper belly pain, loss of appetite, nausea, light-colored stool, dark yellow or brown urine, yellowing of the skin or eyes, unusual weakness or fatigue Mood swings, irritability, or hostility Prolonged or painful erection Sleep apnea--loud snoring, gasping during sleep, daytime sleepiness Stroke--sudden numbness or weakness of the face, arm or leg, trouble speaking, confusion, trouble walking, loss of balance or coordination, dizziness, severe headache, change in vision Swelling of the ankles, hands, or feet Thoughts of suicide or self-harm, worsening mood, feelings of depression Side effects that usually do not require medical attention (report to your care team if they continue or are bothersome): Acne Change in sex drive or performance Pain, redness, or irritation at the application site Unexpected breast tissue growth This list may not describe all possible side effects. Call your doctor for medical advice about side effects. You may report side effects to FDA at 1-800-FDA-1088. Where should I keep my medication? Keep out of the reach of children. This medication can be abused. Keep your medication in a safe place to protect it from theft. Do not share this medication with anyone. Selling or giving away this medication is dangerous and against the law. Store at room temperature between 20 and 25 degrees C (68 and 77 degrees F). Do not freeze. Protect from light. Follow the directions for the product you are prescribed. Throw away any unused medication after the expiration date. NOTE: This sheet is a summary. It may not cover all possible information. If you have questions about this medicine, talk to your doctor, pharmacist, or health care provider.  2022 Elsevier/Gold Standard (2020-07-26 00:00:00)

## 2021-08-20 ENCOUNTER — Ambulatory Visit (INDEPENDENT_AMBULATORY_CARE_PROVIDER_SITE_OTHER): Payer: Medicare HMO | Admitting: Family Medicine

## 2021-08-20 ENCOUNTER — Other Ambulatory Visit: Payer: Self-pay | Admitting: Osteopathic Medicine

## 2021-08-20 ENCOUNTER — Encounter: Payer: Self-pay | Admitting: Family Medicine

## 2021-08-20 ENCOUNTER — Other Ambulatory Visit: Payer: Self-pay

## 2021-08-20 DIAGNOSIS — J209 Acute bronchitis, unspecified: Secondary | ICD-10-CM | POA: Insufficient documentation

## 2021-08-20 DIAGNOSIS — E291 Testicular hypofunction: Secondary | ICD-10-CM

## 2021-08-20 MED ORDER — PREDNISONE 50 MG PO TABS: ORAL_TABLET | ORAL | 0 refills | Status: DC

## 2021-08-20 MED ORDER — CEFDINIR 300 MG PO CAPS: 300.0000 mg | ORAL_CAPSULE | Freq: Two times a day (BID) | ORAL | 0 refills | Status: AC

## 2021-08-20 NOTE — Assessment & Plan Note (Signed)
Prolonged symptoms with double sickening.  Adding course of cefdinir and prednisone.  Continue with increased fluids and rest.  Instructed to contact clinic if symptoms are not improving or he has continued worsening of symptoms.

## 2021-08-20 NOTE — Progress Notes (Signed)
Jonathan Yu - 71 y.o. male MRN [id number]  Date of birth: 08-Oct-1950  Subjective Chief Complaint  Patient presents with   Nasal Congestion    HPI Jonathan Yu is a 71 year old male here today with complaint of chest and nasal congestion.  Symptoms started a few weeks ago.  These initially improved however started to have some increased symptoms again a few days ago.  Cough is productive of thick, light yellow sputum.  He denies sinus pain, fever, chills, dyspnea.  Prior COVID test was negative.  ROS:  A comprehensive ROS was completed and negative except as noted per HPI  Allergies  Allergen Reactions   Sesame Oil Hives and Shortness Of Breath   Clonazepam Hives   Tape Other (See Comments)    White surgical tape caused some blisters.   Percocet [Oxycodone-Acetaminophen] Rash   Topamax [Topiramate]     Cognitive decline   Vicodin [Hydrocodone-Acetaminophen] Rash    Past Medical History:  Diagnosis Date   Allergy    DDD (degenerative disc disease), lumbar    Hyperlipidemia    "no meds since losing 106# 2 yr ago" (10/06/2013)   Hypertension 08/12/2010   "no meds since losing 106# 2 yr ago" (10/06/2013)   OSA on CPAP    "don't wear mask much since losing 106# 2 yr ago" (10/06/2013)   Parkinson's disease ([place])    Type II diabetes mellitus ([place])    "no meds since losing 106# 2 yr ago" (10/06/2013)   Walking pneumonia 08/12/1966    Past Surgical History:  Procedure Laterality Date   ANKLE FRACTURE SURGERY Left 1973   ANKLE RECONSTRUCTION Right 05/2011   CARPAL TUNNEL RELEASE Bilateral 2001   HARDWARE REMOVAL Right 10/07/2013   Procedure: RIGHT ANKLE HARDWARE REMOVAL;  Surgeon: Newt Minion, MD;  Location: [facility];  Service: Orthopedics;  Laterality: Right;   I & D EXTREMITY Right 11/18/2013   Procedure: IRRIGATION AND DEBRIDEMENT EXTREMITY;  Surgeon: Newt Minion, MD;  Location: [facility];  Service: Orthopedics;  Laterality: Right;  Irrigation and Debridement Right Fibula , Place Antibiotic  Beads and VAC   NASAL SEPTUM SURGERY  1982   ORIF ANKLE FRACTURE Right 09/08/2013   Procedure: REPAIR SYNDESMOSIS DISRUPTION RIGHT ANKLE;  Surgeon: Newt Minion, MD;  Location: [facility];  Service: Orthopedics;  Laterality: Right;   SHOULDER ARTHROSCOPY W/ ROTATOR CUFF REPAIR Right 2006; 2009   TONSILLECTOMY  1959   TOTAL ANKLE ARTHROPLASTY Right 08/18/2013   Procedure: TOTAL ANKLE ARTHOPLASTY;  Surgeon: Newt Minion, MD;  Location: [facility];  Service: Orthopedics;  Laterality: Right;  Right Total Ankle Arthroplasty, Revision Fibular Fracture   URETERAL STENT PLACEMENT  ~ 2010 X 2    Social History   Socioeconomic History   Marital status: Divorced    Spouse name: Not on file   Number of children: 6   Years of education: 14   Highest education level: Some college, no degree  Occupational History   Occupation: sales/ DJ    Comment: part time  Tobacco Use   Smoking status: Some Days    Types: Pipe, Cigars   Smokeless tobacco: Never   Tobacco comments:    cigar 1-2 times/[place]  Vaping Use   Vaping Use: Never used  Substance and Sexual Activity   Alcohol use: Yes    Comment: 1-2 drinks per year.   Drug use: No   Sexual activity: Yes  Other Topics Concern   Not on file  Social History Narrative  Lives with his son and his daughter visits often. He does Lobbyist, enjoys singing, music and yard work (when he can).    Social Determinants of Health   Financial Resource Strain: Low Risk    Difficulty of Paying Living Expenses: Not hard at all  Food Insecurity: No Food Insecurity   Worried About Charity fundraiser in the Last Year: Never true   [place] in the Last Year: Never true  Transportation Needs: No Transportation Needs   Lack of Transportation (Medical): No   Lack of Transportation (Non-Medical): No  Physical Activity: Inactive   Days of Exercise per Week: 0 days   Minutes of Exercise per Session: 0 min  Stress: No Stress Concern Present   Feeling of Stress : Not at  all  Social Connections: Moderately Isolated   Frequency of Communication with Friends and Family: More than three times a week   Frequency of Social Gatherings with Friends and Family: More than three times a week   Attends Religious Services: More than 4 times per year   Active Member of [employer] or Organizations: No   Attends Archivist Meetings: Never   Marital Status: Divorced    Family History  Problem Relation Age of Onset   Cancer Mother 43       Lung   Alzheimer's disease Father    Parkinson's disease Father    Scoliosis Sister    Cleft lip Sister     Health Maintenance  Topic Date Due   COVID-19 Vaccine (3 - Pfizer risk series) 12/08/2019   Zoster Vaccines- Shingrix (2 of 2) 10/10/2021 (Originally 07/05/2021)   COLONOSCOPY (Pts 45-54yrs Insurance coverage will need to be confirmed)  06/03/2029   TETANUS/TDAP  05/11/2031   Pneumonia Vaccine 86+ Years old  Completed   INFLUENZA VACCINE  Completed   Hepatitis C Screening  Completed   HPV VACCINES  Aged Out     ----------------------------------------------------------------------------------------------------------------------------------------------------------------------------------------------------------------- Physical Exam BP (!) 159/88 (BP Location: Left Arm, Patient Position: Sitting, Cuff Size: Large)    Pulse 72    Ht 5\' 6"  (1.676 m)    Wt 269 lb 14.4 oz (122.4 kg)    SpO2 94%    BMI 43.56 kg/m   Physical Exam Constitutional:      Appearance: Normal appearance.  HENT:     Right Ear: Tympanic membrane normal.     Left Ear: Tympanic membrane normal.     Mouth/Throat:     Mouth: Mucous membranes are moist.  Eyes:     General: No scleral icterus. Cardiovascular:     Rate and Rhythm: Normal rate and regular rhythm.  Pulmonary:     Effort: Pulmonary effort is normal.     Comments: Scattered rhonchi throughout bilateral lung fields. Musculoskeletal:     Cervical back: Neck supple.   Neurological:     Mental Status: He is alert.    ------------------------------------------------------------------------------------------------------------------------------------------------------------------------------------------------------------------- Assessment and Plan  Acute bronchitis Prolonged symptoms with double sickening.  Adding course of cefdinir and prednisone.  Continue with increased fluids and rest.  Instructed to contact clinic if symptoms are not improving or he has continued worsening of symptoms.   Meds ordered this encounter  Medications   cefdinir (OMNICEF) 300 MG capsule    Sig: Take 1 capsule (300 mg total) by mouth 2 (two) times daily for 10 days.    Dispense:  20 capsule    Refill:  0   predniSONE (DELTASONE) 50 MG tablet  Sig: Take 1 tab po daily x5 days.    Dispense:  5 tablet    Refill:  0    No follow-ups on file.    This visit occurred during the SARS-CoV-2 public health emergency.  Safety protocols were in place, including screening questions prior to the visit, additional usage of staff PPE, and extensive cleaning of exam room while observing appropriate contact time as indicated for disinfecting solutions.

## 2021-08-20 NOTE — Patient Instructions (Signed)
Acute Bronchitis, Adult °Acute bronchitis is sudden inflammation of the main airways (bronchi) that come off the windpipe (trachea) in the lungs. The swelling causes the airways to get smaller and make more mucus than normal. This can make it hard to breathe and can cause coughing or noisy breathing (wheezing). °Acute bronchitis may last several weeks. The cough may last longer. Allergies, asthma, and exposure to smoke may make the condition worse. °What are the causes? °This condition can be caused by germs and by substances that irritate the lungs, including: °Cold and flu viruses. The most common cause of this condition is the virus that causes the common cold. °Bacteria. This is less common. °Breathing in substances that irritate the lungs, including: °Smoke from cigarettes and other forms of tobacco. °Dust and pollen. °Fumes from household cleaning products, gases, or burned fuel. °Indoor or outdoor air pollution. °What increases the risk? °The following factors may make you more likely to develop this condition: °A weak body's defense system, also called the immune system. °A condition that affects your lungs and breathing, such as asthma. °What are the signs or symptoms? °Common symptoms of this condition include: °Coughing. This may bring up clear, yellow, or green mucus from your lungs (sputum). °Wheezing. °Runny or stuffy nose. °Having too much mucus in your lungs (chest congestion). °Shortness of breath. °Aches and pains, including sore throat or chest. °How is this diagnosed? °This condition is usually diagnosed based on: °Your symptoms and medical history. °A physical exam. °You may also have other tests, including tests to rule out other conditions, such as pneumonia. These tests include: °A test of lung function. °Test of a mucus sample to look for the presence of bacteria. °Tests to check the oxygen level in your blood. °Blood tests. °Chest X-ray. °How is this treated? °Most cases of acute bronchitis  clear up over time without treatment. Your health care provider may recommend: °Drinking more fluids to help thin your mucus so it is easier to cough up. °Taking inhaled medicine (inhaler) to improve air flow in and out of your lungs. °Using a vaporizer or a humidifier. These are machines that add water to the air to help you breathe better. °Taking a medicine that thins mucus and clears congestion (expectorant). °Taking a medicine that prevents or stops coughing (cough suppressant). °It is notcommon to take an antibiotic medicine for this condition. °Follow these instructions at home: ° °Take over-the-counter and prescription medicines only as told by your health care provider. °Use an inhaler, vaporizer, or humidifier as told by your health care provider. °Take two teaspoons (10 mL) of honey at bedtime to lessen coughing at night. °Drink enough fluid to keep your urine pale yellow. °Do not use any products that contain nicotine or tobacco. These products include cigarettes, chewing tobacco, and vaping devices, such as e-cigarettes. If you need help quitting, ask your health care provider. °Get plenty of rest. °Return to your normal activities as told by your health care provider. Ask your health care provider what activities are safe for you. °Keep all follow-up visits. This is important. °How is this prevented? °To lower your risk of getting this condition again: °Wash your hands often with soap and water for at least 20 seconds. If soap and water are not available, use hand sanitizer. °Avoid contact with people who have cold symptoms. °Try not to touch your mouth, nose, or eyes with your hands. °Avoid breathing in smoke or chemical fumes. Breathing smoke or chemical fumes will make your condition   worse. °Get the flu shot every year. °Contact a health care provider if: °Your symptoms do not improve after 2 weeks. °You have trouble coughing up the mucus. °Your cough keeps you awake at night. °You have a  fever. °Get help right away if you: °Cough up blood. °Feel pain in your chest. °Have severe shortness of breath. °Faint or keep feeling like you are going to faint. °Have a severe headache. °Have a fever or chills that get worse. °These symptoms may represent a serious problem that is an emergency. Do not wait to see if the symptoms will go away. Get medical help right away. Call your local emergency services (911 in the [place] Do not drive yourself to the hospital. °Summary °Acute bronchitis is inflammation of the main airways (bronchi) that come off the windpipe (trachea) in the lungs. The swelling causes the airways to get smaller and make more mucus than normal. °Drinking more fluids can help thin your mucus so it is easier to cough up. °Take over-the-counter and prescription medicines only as told by your health care provider. °Do not use any products that contain nicotine or tobacco. These products include cigarettes, chewing tobacco, and vaping devices, such as e-cigarettes. If you need help quitting, ask your health care provider. °Contact a health care provider if your symptoms do not improve after 2 weeks. °This information is not intended to replace advice given to you by your health care provider. Make sure you discuss any questions you have with your health care provider. °Document Revised: 11/29/2020 Document Reviewed: 11/29/2020 °Elsevier Patient Education © 2022 Elsevier Inc. ° °

## 2021-08-23 ENCOUNTER — Ambulatory Visit: Payer: Medicare HMO | Admitting: Family Medicine

## 2021-08-23 ENCOUNTER — Ambulatory Visit: Payer: Medicare HMO

## 2021-09-03 ENCOUNTER — Other Ambulatory Visit: Payer: Self-pay | Admitting: Family Medicine

## 2021-09-13 ENCOUNTER — Other Ambulatory Visit: Payer: Self-pay

## 2021-09-13 ENCOUNTER — Ambulatory Visit (INDEPENDENT_AMBULATORY_CARE_PROVIDER_SITE_OTHER): Payer: Medicare HMO | Admitting: Family Medicine

## 2021-09-13 VITALS — BP 134/77 | HR 70 | Ht 66.0 in | Wt 265.0 lb

## 2021-09-13 DIAGNOSIS — E291 Testicular hypofunction: Secondary | ICD-10-CM | POA: Diagnosis not present

## 2021-09-13 NOTE — Progress Notes (Signed)
Patient is here for testosterone injection. Denies chest pain, shortness of breath, headaches, and problems with medication or mood changes.   Patient tolerated testosterone 200 mg injection to LUOQ well without complications. Patient advised to schedule next injection in 21 days.

## 2021-09-13 NOTE — Progress Notes (Signed)
Medical screening examination/treatment was performed by qualified clinical staff member and as supervising physician I was immediately available for consultation/collaboration. I have reviewed documentation and agree with assessment and plan.  Chianna Spirito, DO  

## 2021-10-04 ENCOUNTER — Ambulatory Visit: Payer: Medicare HMO | Admitting: Neurology

## 2021-10-04 ENCOUNTER — Encounter: Payer: Self-pay | Admitting: Family Medicine

## 2021-10-04 ENCOUNTER — Other Ambulatory Visit: Payer: Self-pay

## 2021-10-04 ENCOUNTER — Ambulatory Visit (INDEPENDENT_AMBULATORY_CARE_PROVIDER_SITE_OTHER): Payer: Medicare HMO | Admitting: Family Medicine

## 2021-10-04 VITALS — BP 135/80 | HR 90 | Temp 97.6°F | Ht 65.0 in | Wt 264.0 lb

## 2021-10-04 VITALS — BP 136/72 | HR 103 | Ht 66.0 in | Wt 261.4 lb

## 2021-10-04 DIAGNOSIS — E785 Hyperlipidemia, unspecified: Secondary | ICD-10-CM | POA: Diagnosis not present

## 2021-10-04 DIAGNOSIS — E291 Testicular hypofunction: Secondary | ICD-10-CM

## 2021-10-04 DIAGNOSIS — M25472 Effusion, left ankle: Secondary | ICD-10-CM

## 2021-10-04 DIAGNOSIS — G20 Parkinson's disease: Secondary | ICD-10-CM

## 2021-10-04 DIAGNOSIS — E559 Vitamin D deficiency, unspecified: Secondary | ICD-10-CM

## 2021-10-04 DIAGNOSIS — G4719 Other hypersomnia: Secondary | ICD-10-CM

## 2021-10-04 DIAGNOSIS — M25471 Effusion, right ankle: Secondary | ICD-10-CM

## 2021-10-04 DIAGNOSIS — N400 Enlarged prostate without lower urinary tract symptoms: Secondary | ICD-10-CM | POA: Diagnosis not present

## 2021-10-04 DIAGNOSIS — Z9989 Dependence on other enabling machines and devices: Secondary | ICD-10-CM | POA: Diagnosis not present

## 2021-10-04 DIAGNOSIS — F411 Generalized anxiety disorder: Secondary | ICD-10-CM | POA: Diagnosis not present

## 2021-10-04 DIAGNOSIS — R7303 Prediabetes: Secondary | ICD-10-CM | POA: Diagnosis not present

## 2021-10-04 DIAGNOSIS — R351 Nocturia: Secondary | ICD-10-CM

## 2021-10-04 DIAGNOSIS — G4733 Obstructive sleep apnea (adult) (pediatric): Secondary | ICD-10-CM

## 2021-10-04 DIAGNOSIS — G20C Parkinsonism, unspecified: Secondary | ICD-10-CM

## 2021-10-04 DIAGNOSIS — I10 Essential (primary) hypertension: Secondary | ICD-10-CM

## 2021-10-04 DIAGNOSIS — Z6841 Body Mass Index (BMI) 40.0 and over, adult: Secondary | ICD-10-CM | POA: Diagnosis not present

## 2021-10-04 MED ORDER — ROPINIROLE HCL 2 MG PO TABS: 2.0000 mg | ORAL_TABLET | Freq: Three times a day (TID) | ORAL | 3 refills | Status: DC

## 2021-10-04 NOTE — Patient Instructions (Signed)
It was nice to see you again as always.  Here is what we discussed today and what we came up with as our plan for you:  We will increase the ropinirole to 2 mg strength, take 1 pill 3 times a day for total of 6 mg, this is a slight increase from your current total dose of 4.5 mg daily.  Please continue to work with physical therapy and try to exercise, work on weight loss.  Based on your symptoms and your exam I believe you are still at risk for obstructive sleep apnea and would benefit from reevaluation as it has been many years and you need new supplies and an updated machine. Therefore, I think we should proceed with a split-night sleep study to determine how severe your sleep apnea is, and also start treatment in the same study.  You do not need to bring the machine but you do need to bring your nighttime medication.   Please remember, the risks and ramifications of moderate to severe obstructive sleep apnea or OSA are: Cardiovascular disease, including congestive heart failure, stroke, difficult to control hypertension, arrhythmias, and even type 2 diabetes has been linked to untreated OSA. Sleep apnea causes disruption of sleep and sleep deprivation in most cases, which, in turn, can cause recurrent headaches, problems with memory, mood, concentration, focus, and vigilance. Most people with untreated sleep apnea report excessive daytime sleepiness, which can affect their ability to drive. Please do not drive if you feel sleepy.   Our sleep lab administrative assistant will call you to schedule your sleep study. If you don't hear back from her by about 2 weeks from now, please feel free to call her at [phone]. You can leave a message with your phone number and concerns, if you get the voicemail box. She will call back as soon as possible.   We will plan to follow-up after sleep testing.

## 2021-10-04 NOTE — Progress Notes (Signed)
Patient is here for testosterone injection. Denies chest pain, shortness of breath, headaches, and problems with medication or mood changes.   Patient tolerated testosterone 200 mg injection to RUOQ well without complications. Patient advised to schedule next injection in 21 days.   

## 2021-10-04 NOTE — Assessment & Plan Note (Signed)
Management per neurology.  Stable with Requip at this time.

## 2021-10-04 NOTE — Assessment & Plan Note (Signed)
Stable with BuSpar daily and alprazolam as needed.

## 2021-10-04 NOTE — Progress Notes (Signed)
Subjective:    Patient ID: Jonathan Yu is a 71 y.o. male.  HPI    Interim history:   Jonathan Yu is a 71 year old right-handed gentleman with an underlying medical history of degenerative disc disease, hyperlipidemia, hypertension, diabetes, vitamin D deficiency, obstructive sleep apnea (on PAP therapy), arthritis, status post multiple surgeries, allergies, low testosterone, PD and severe obesity with a BMI of over 40, who presents for evaluation of his obstructive sleep apnea.  The patient is unaccompanied today.  I last saw Jonathan Yu on 07/25/2021, at which time Jonathan Yu was on cover reported that his insurance would no longer cover the patch after the first of the year.  Jonathan Yu had a 46-month supply at the time.  Jonathan Yu was compliant with his CPAP of 13 cm.  Jonathan Yu was using furosemide as needed for lower extremity swelling.  Jonathan Yu was trying to exercise more.  I suggested we switch Jonathan Yu to ropinirole 0.5 mg strength 1 p.o. 3 times daily with gradual increase to 1.5 mg 3 times daily.  Jonathan Yu was on BuSpar for anxiety.  I increase the BuSpar to 5 mg 3 times daily.  Today, 10/04/2021: I reviewed his CPAP compliance data from 09/04/2021 through 10/03/2021, which is a total of 30 days, during which time Jonathan Yu used his machine 29 days with percent use days greater than 4 hours at 86.7%, indicating very good compliance with an average usage of 6 hours and 58 minutes, residual AHI borderline at 5.2/h, pressure at 13 cm with a ramp time of 20 minutes, ramp start at 12 cm.  Jonathan Yu reports having had sleep testing in 1998. Jonathan Yu received a replacement Resp[employer]ics machine in December 2020 and BiPAP machine was affected by the recall so Jonathan Yu never actually opened even the box and it still sitting in the original box.  Jonathan Yu currently has His Epworth sleepiness score is 12 out of 24, fatigue severity score is 46 out of 63.  His DME company is [employer].  Jonathan Yu is on the list to get a replacement from [employer] directly for the recall.  Jonathan Yu does not have an update  for this.  His sleep schedule is erratic, in part because of his work schedule particularly on the weekends.  Jonathan Yu may come home for 5 AM.  Jonathan Yu has discomfort in the knees and both ankles, particularly the left ankle bothers Jonathan Yu now.  Jonathan Yu goes to physical therapy regularly for his legs.  Jonathan Yu has seen Dr. Sharol Given in orthopedics and had a right ankle replacement some 10 years ago.  Jonathan Yu has switched over to ropinirole, currently on 1.5 mg 3 times daily, Jonathan Yu has noticed some daytime tiredness.  Sometimes Jonathan Yu sleeps in his lift chair, reclined.  When Jonathan Yu is not working, Jonathan Yu may go to bed around 10 PM but his sleep is interrupted, Jonathan Yu will get up after 2 hours.  Jonathan Yu has nocturia about 3-4 times per average night, Jonathan Yu gets out of bed at 6:30 AM to take his youngest daughter to school.  Jonathan Yu does not drink caffeine daily, Jonathan Yu drinks alcohol rarely, maybe 2 or 3 times a year and smokes a cigar occasionally, no cigarettes.   The patient's allergies, current medications, family history, past medical history, past social history, past surgical history and problem list were reviewed and updated as appropriate.    Previously:   I saw Jonathan Yu on 03/26/2021, at which time Jonathan Yu reported that Neupro had helped.  Jonathan Yu was able to tolerate the patch.  Jonathan Yu was motivated to start exercising regularly.  The Neupro was quite expensive.  Jonathan Yu was advised to continue with Neupro patch 4 mg once daily and monitor his lower extremity swelling.  For his anxiety, I suggested Jonathan Yu start BuSpar 5 mg strength as needed.     I saw Jonathan Yu on 12/28/2020, at which time I suggested Jonathan Yu start Neupro patch.  We talked about his DaTscan results supporting Parkinson's disease.        I first met Jonathan Yu on 11/22/2020 at the request of his primary care physician, at which time the patient gave an approximately 2-year history of tremors affecting primarily his right lower and upper extremities.  His presentation was concerning for parkinsonism.  Jonathan Yu was advised to proceed with a nuclear  medicine DaTscan.   Jonathan Yu had a DaTscan on 12/26/2020 and I reviewed the results:  IMPRESSION: Marked decreased activity within the bilateral posterior striatum is a pattern typical of Parkinsonian syndrome pathology.   Of note, DaTSCAN is not diagnostic of Parkinsonian syndromes, which remains a clinical diagnosis. DaTscan is an adjuvant test to aid in the clinical diagnosis of Parkinsonian syndromes.   We called Jonathan Yu with his test results.     11/22/20: (Jonathan Yu) reports an approximately 2-year history of tremors affecting primarily the right side.  Jonathan Yu first noticed right foot tremor.  His tremor has progressed, sometimes his left side feels worse, Jonathan Yu has also noticed fine motor dyscontrol and difficulty with movement in general, feels fatigued.  His symptoms got worse when Jonathan Yu had Covid some 18 months ago and also reports that his anxiety has become much more noticeable.  Previously, Jonathan Yu was not an anxious person, now Jonathan Yu is often anxious.  Jonathan Yu does have a prescription for Xanax but rarely uses it.  Jonathan Yu has not fallen recently.  Jonathan Yu tries to stay active.  Jonathan Yu has multiple joint related issues including right ankle replacement, left ankle injury and repair some 50 years ago, no hardware in place but right ankle replacement some 10 years ago after an accident.  Jonathan Yu quit working after that.  Jonathan Yu also has right knee pain and bilateral rotator cuff problems, status post surgeries twice on the right, no surgery on the left but significant decrease in range of motion in the left shoulder.  Jonathan Yu works with a physical therapist about once a week.  Jonathan Yu has not been exercising as such on a regular basis.  Jonathan Yu is generally concerned about his constellation of symptoms such as tremors, mobility issues, problems with fatigue and overall deconditioning.  Jonathan Yu is divorced and lives with his youngest daughter who is 17 and his son who is 13, and divorced.  Jonathan Yu has a total of 6 children.  Jonathan Yu himself is the oldest of 5 kids, had 2 sisters and 2  brothers, younger sister passed away.  His father had Parkinson's disease later in life, lived to be 52, mom died from lung cancer at 25.  Jonathan Yu does not smoke cigarettes.  Jonathan Yu does occasionally smoke a cigar but none in a year approximately.  Jonathan Yu drinks alcohol rarely.  Jonathan Yu does not drink caffeine on a day-to-day basis.   Jonathan Yu uses his CPAP faithfully.  Jonathan Yu is retired from working with [employer] for many years.  Jonathan Yu does have a remote history of dream enactment behaviors.  These have been infrequently now.  Jonathan Yu fell out of bed some 3 years ago as Jonathan Yu was dreaming about fighting somebody.  Jonathan Yu does have a history of sleep talking.   I reviewed your office note from  05/14/2021. Jonathan Yu had blood work through your office on 09/07/2020 and I reviewed the results, vitamin D was mildly low at 27.  His TSH was normal in August 2021.  His B12 was 407 on 03/16/2020.  A1c was 6.0.   His Past Medical History Is Significant For: Past Medical History:  Diagnosis Date   Allergy    DDD (degenerative disc disease), lumbar    Hyperlipidemia    "no meds since losing 106# 2 yr ago" (10/06/2013)   Hypertension 08/12/2010   "no meds since losing 106# 2 yr ago" (10/06/2013)   OSA on CPAP    "don't wear mask much since losing 106# 2 yr ago" (10/06/2013)   Parkinson's disease ([place])    Type II diabetes mellitus ([place])    "no meds since losing 106# 2 yr ago" (10/06/2013)   Walking pneumonia 08/12/1966    His Past Surgical History Is Significant For: Past Surgical History:  Procedure Laterality Date   ANKLE FRACTURE SURGERY Left 1973   ANKLE RECONSTRUCTION Right 05/2011   CARPAL TUNNEL RELEASE Bilateral 2001   HARDWARE REMOVAL Right 10/07/2013   Procedure: RIGHT ANKLE HARDWARE REMOVAL;  Surgeon: Newt Minion, MD;  Location: [facility];  Service: Orthopedics;  Laterality: Right;   I & D EXTREMITY Right 11/18/2013   Procedure: IRRIGATION AND DEBRIDEMENT EXTREMITY;  Surgeon: Newt Minion, MD;  Location: [facility];  Service: Orthopedics;   Laterality: Right;  Irrigation and Debridement Right Fibula , Place Antibiotic Beads and VAC   NASAL SEPTUM SURGERY  1982   ORIF ANKLE FRACTURE Right 09/08/2013   Procedure: REPAIR SYNDESMOSIS DISRUPTION RIGHT ANKLE;  Surgeon: Newt Minion, MD;  Location: [facility];  Service: Orthopedics;  Laterality: Right;   SHOULDER ARTHROSCOPY W/ ROTATOR CUFF REPAIR Right 2006; 2009   TONSILLECTOMY  1959   TOTAL ANKLE ARTHROPLASTY Right 08/18/2013   Procedure: TOTAL ANKLE ARTHOPLASTY;  Surgeon: Newt Minion, MD;  Location: [facility];  Service: Orthopedics;  Laterality: Right;  Right Total Ankle Arthroplasty, Revision Fibular Fracture   URETERAL STENT PLACEMENT  ~ 2010 X 2    His Family History Is Significant For: Family History  Problem Relation Age of Onset   Cancer Mother 53       Lung   Alzheimer's disease Father    Parkinson's disease Father    Scoliosis Sister    Cleft lip Sister     His Social History Is Significant For: Social History   Socioeconomic History   Marital status: Divorced    Spouse name: Not on file   Number of children: 6   Years of education: 14   Highest education level: Some college, no degree  Occupational History   Occupation: sales/ DJ    Comment: part time  Tobacco Use   Smoking status: Some Days    Types: Pipe, Cigars   Smokeless tobacco: Never   Tobacco comments:    cigar 1-2 times/[place]  Vaping Use   Vaping Use: Never used  Substance and Sexual Activity   Alcohol use: Yes    Comment: 1-2 drinks per year.   Drug use: No   Sexual activity: Yes  Other Topics Concern   Not on file  Social History Narrative   Lives with his son and his daughter visits often. 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    Social Determinants of Health   Financial Resource Strain: Low Risk    Difficulty of Paying Living Expenses: Not hard  at all  Food Insecurity: No Food Insecurity   Worried About Charity fundraiser in the Last Year: Never true   Ran  Out of Food in the Last Year: Never true  Transportation Needs: No Transportation Needs   Lack of Transportation (Medical): No   Lack of Transportation (Non-Medical): No  Physical Activity: Inactive   Days of Exercise per Week: 0 days   Minutes of Exercise per Session: 0 min  Stress: No Stress Concern Present   Feeling of Stress : Not at all  Social Connections: Moderately Isolated   Frequency of Communication with Friends and Family: More than three times a week   Frequency of Social Gatherings with Friends and Family: More than three times a week   Attends Religious Services: More than 4 times per year   Active Member of [employer] or Organizations: No   Attends Archivist Meetings: Never   Marital Status: Divorced    His Allergies Are:  Allergies  Allergen Reactions   Sesame Oil Hives and Shortness Of Breath   Clonazepam Hives   Tape Other (See Comments)    White surgical tape caused some blisters.   Percocet [Oxycodone-Acetaminophen] Rash   Topamax [Topiramate]     Cognitive decline   Vicodin [Hydrocodone-Acetaminophen] Rash  :   His Current Medications Are:  Outpatient Encounter Medications as of 10/04/2021  Medication Sig   albuterol (PROVENTIL HFA;VENTOLIN HFA) 108 (90 Base) MCG/ACT inhaler Inhale 2 puffs into the lungs every 6 (six) hours as needed for wheezing or shortness of breath.   ALPRAZolam (XANAX) 0.5 MG tablet Take 0.5-1 tablets (0.25-0.5 mg total) by mouth at bedtime as needed for sleep.   AMBULATORY NON FORMULARY MEDICATION Continue CPAP at current settings.  Please provide new supplies. Micheal Likens phone number [phone]   AMBULATORY NON FORMULARY MEDICATION Compression stockings.  Vive size LGM Leg swelling. Disp 1 pair 99 refil.   anastrozole (ARIMIDEX) 1 MG tablet Take 1 tablet (1 mg total) by mouth daily.   atorvastatin (LIPITOR) 40 MG tablet TAKE 1 TABLET EVERY DAY   Azelaic Acid 15 % cream Apply 1 application topically 2 (two) times  daily.   busPIRone (BUSPAR) 5 MG tablet Take 1 tablet (5 mg total) by mouth 3 (three) times daily as needed.   docusate sodium (COLACE) 50 MG capsule Take 50 mg by mouth daily.   fluticasone (FLONASE) 50 MCG/ACT nasal spray Place into both nostrils daily.   furosemide (LASIX) 20 MG tablet Take 1 tablet (20 mg total) by mouth daily as needed for edema.   loratadine (CLARITIN) 10 MG tablet Take 10 mg by mouth daily.   losartan (COZAAR) 50 MG tablet TAKE 1 TABLET EVERY DAY   Misc. Devices (FLEX THERAPY) MISC by Does not apply route.   Multiple Vitamin (MULTIVITAMIN WITH MINERALS) TABS tablet Take 1 tablet by mouth daily.   Probiotic Product (PROBIOTIC PO) Take by mouth daily.   rOPINIRole (REQUIP) 0.5 MG tablet Take 3 tablets (1.5 mg total) by mouth 3 (three) times daily.   tadalafil (CIALIS) 20 MG tablet TAKE 1/2 -1 TABLET BY MOUTH EVERY OTHER DAY FOR ERECTILE DYSFUNCTION   tamsulosin (FLOMAX) 0.4 MG CAPS capsule TAKE 1 CAPSULE BY MOUTH DAILY AFTER BREAKFAST.   VITAMIN D PO Take by mouth.   Facility-Administered Encounter Medications as of 10/04/2021  Medication   testosterone cypionate (DEPOTESTOSTERONE CYPIONATE) injection 200 mg  :  Review of Systems:  Out of a complete 14 point review of systems, all  are reviewed and negative with the exception of these symptoms as listed below:  Review of Systems  Neurological:        Doing ok on requip (does notice when Jonathan Yu needs as it is a TID med). ESS 12, FSS 46. DME [employer], Using [employer] (machine not recalled), but is needing new order.    Objective:  Neurological Exam  Physical Exam Physical Examination:   Vitals:   10/04/21 1339  BP: 136/72  Pulse: (!) 103    General Examination: The patient is a very pleasant 71 y.o. male in no acute distress. Jonathan Yu appears well-developed and well-nourished and well groomed.   HEENT: Normocephalic, atraumatic, pupils are equal, round and reactive to light, extraocular tracking is generally preserved,  mild to moderate decrease in eye blink rate noted and mild to moderate facial masking noted.  Dry eyes bilaterally.  Mild nuchal rigidity noted, no lip, neck or jaw tremor, no carotid bruits.  Airway examination reveals mild mouth dryness, tongue protrudes centrally and palate elevates symmetrically.  Moderate airway crowding.  Thicker tongue noted.  No significant hypophonia, no dysarthria noted.    Chest: Clear to auscultation without wheezing, rhonchi or crackles noted.   Heart: S1+S2+0, regular and normal without murmurs, rubs or gallops noted.    Abdomen: Soft, non-tender and non-distended.       Extremities: There is 1+ pitting edema in both lower extremities, more so on the left side.  Left leg larger in caliber than right.     Skin: Warm and dry without trophic changes noted.    Musculoskeletal: exam reveals decreased range of motion in both shoulders, right shoulder is higher than left.  Scar left ankle and distal leg, scar right ankle, from prior surgeries, mild bilateral knee discomfort.       Neurologically:  Mental status: The patient is awake, alert and oriented in all 4 spheres. His immediate and remote memory, attention, language skills and fund of knowledge are appropriate. There is no evidence of aphasia, agnosia, apraxia or anomia. Mood is normal and affect is normal.  Cranial nerves II - XII are as described above under HEENT exam.  Unequalshoulder height noted.   Motor exam: Normal bulk, mild increase in tone in the right more than left upper extremity, no telltale cogwheeling.  Mild to moderate bradykinesia.  Jonathan Yu has a mild intermittent resting tremor in the right upper and right lower extremity.  Jonathan Yu has no significant postural or action tremor. No left sided resting tremor.    (On 11/22/2020: On Archimedes spiral drawing Jonathan Yu has no  significant trembling with the right hand which is his dominant hand, insecurity noted with the left hand but no trembling as such, handwriting is  on the smaller side, legible.)     Fine motor skills shows mild to moderate impairment on the right side, slightly better on the left.  Cerebellar testing: No dysmetria or intention tremor on finger to nose testing. Heel to shin is unremarkable bilaterally. There is no truncal or gait ataxia.  Sensory exam: intact to light touch in the upper and lower extremities.  Gait, station and balance: Jonathan Yu stands with mild difficulty, pushes himself up, posture is age-appropriate to slightly stooped for age.  Jonathan Yu walks with no telltale shuffling but decreased arm swing on the right.  Jonathan Yu walks slightly slowly, turns slowly.    Assessment and Plan:    In summary, Exodus Kutzer is a very pleasant 71 year old male with an underlying medical history of degenerative  disc disease, hyperlipidemia, hypertension, diabetes, vitamin D deficiency, obstructive sleep apnea on CPAP therapy, arthritis, status post multiple surgeries allergies, low testosterone, and severe obesity with a BMI of over 40, who presents for evaluation of his obstructive sleep apnea.  Jonathan Yu is compliant with his current CPAP of 13 cm with borderline AHI.  His sleep testing was decades ago.  Prior sleep study results are not available for my review today.  Weight has been fluctuating.  We will proceed with a split-night sleep study to reestablish diagnosis and hopefully optimize treatment settings and get Jonathan Yu a new machine as well.  For his Parkinson's disease, I would like for Jonathan Yu to increase his ropinirole to 2 mg 3 times daily at this time.  Jonathan Yu started ropinirole in lieu of Neupro in December 2022 as his insurance no longer covers the Neupro patch.  Jonathan Yu still has a number of patches left, Jonathan Yu is asked to hold onto them for now.  Jonathan Yu had a DaTscan on 12/26/2020 which showed marked decreased activity within the bilateral posterior striatum, and was, as such, supportive of an underlying parkinsonian syndrome.  No obvious atypical features at this time.  Jonathan Yu started  Neupro patch in May 2022 with good tolerance.  Jonathan Yu was able to increase it to 4 mg once daily.  Jonathan Yu has a history of dream enactment behavior, currently not a pressing issue. Jonathan Yu was started on BuSpar in August 2022 for his anxiety, it has been helpful.  Jonathan Yu takes 5 mg up to 3 times daily. We will plan to follow-up after sleep testing.  I answered all his questions today and Jonathan Yu was in agreement. I spent 40 minutes in total face-to-face time and in reviewing records during pre-charting, more than 50% of which was spent in counseling and coordination of care, reviewing test results, reviewing medications and treatment regimen and/or in discussing or reviewing the diagnosis of PD, OSA, the prognosis and treatment options. Pertinent laboratory and imaging test results that were available during this visit with the patient were reviewed by me and considered in my medical decision making (see chart for details).

## 2021-10-04 NOTE — Assessment & Plan Note (Signed)
Feels good with current dose of testosterone and anastrozole.  Update testosterone, CBC, PSA and estradiol levels.

## 2021-10-04 NOTE — Assessment & Plan Note (Signed)
Update A1c today 

## 2021-10-04 NOTE — Assessment & Plan Note (Signed)
Blood pressure stable at this time with current medications.  Recommend continuation.  Updating labs today.

## 2021-10-04 NOTE — Progress Notes (Signed)
Jonathan Yu - 71 y.o. male MRN [id number]  Date of birth: 1950/10/02  Subjective Chief Complaint  Patient presents with   Follow-up    HPI Jonathan Yu is a 71 year old male here today for follow-up visit.  Continues to see neurology for management of Parkinson's.  Feels like disease has progressed some but overall doing well with Requip.  Continues to feel pretty good with current testosterone dose and anastrozole.  He has not noted any side effects from this.  Blood pressure remains well controlled with losartan.  He has not had chest pain, shortness of breath, palpitations, headaches or vision changes.  Anxiety is fairly well controlled with BuSpar daily and alprazolam as needed.  ROS:  A comprehensive ROS was completed and negative except as noted per HPI  Allergies  Allergen Reactions   Sesame Oil Hives and Shortness Of Breath   Clonazepam Hives   Tape Other (See Comments)    White surgical tape caused some blisters.   Percocet [Oxycodone-Acetaminophen] Rash   Topamax [Topiramate]     Cognitive decline   Vicodin [Hydrocodone-Acetaminophen] Rash    Past Medical History:  Diagnosis Date   Allergy    DDD (degenerative disc disease), lumbar    Hyperlipidemia    "no meds since losing 106# 2 yr ago" (10/06/2013)   Hypertension 08/12/2010   "no meds since losing 106# 2 yr ago" (10/06/2013)   OSA on CPAP    "don't wear mask much since losing 106# 2 yr ago" (10/06/2013)   Parkinson's disease ([place])    Type II diabetes mellitus ([place])    "no meds since losing 106# 2 yr ago" (10/06/2013)   Walking pneumonia 08/12/1966    Past Surgical History:  Procedure Laterality Date   ANKLE FRACTURE SURGERY Left 1973   ANKLE RECONSTRUCTION Right 05/2011   CARPAL TUNNEL RELEASE Bilateral 2001   HARDWARE REMOVAL Right 10/07/2013   Procedure: RIGHT ANKLE HARDWARE REMOVAL;  Surgeon: Newt Minion, MD;  Location: [place];  Service: Orthopedics;  Laterality: Right;   I & D EXTREMITY Right 11/18/2013    Procedure: IRRIGATION AND DEBRIDEMENT EXTREMITY;  Surgeon: Newt Minion, MD;  Location: [facility];  Service: Orthopedics;  Laterality: Right;  Irrigation and Debridement Right Fibula , Place Antibiotic Beads and VAC   NASAL SEPTUM SURGERY  1982   ORIF ANKLE FRACTURE Right 09/08/2013   Procedure: REPAIR SYNDESMOSIS DISRUPTION RIGHT ANKLE;  Surgeon: Newt Minion, MD;  Location: [facility];  Service: Orthopedics;  Laterality: Right;   SHOULDER ARTHROSCOPY W/ ROTATOR CUFF REPAIR Right 2006; 2009   TONSILLECTOMY  1959   TOTAL ANKLE ARTHROPLASTY Right 08/18/2013   Procedure: TOTAL ANKLE ARTHOPLASTY;  Surgeon: Newt Minion, MD;  Location: [place];  Service: Orthopedics;  Laterality: Right;  Right Total Ankle Arthroplasty, Revision Fibular Fracture   URETERAL STENT PLACEMENT  ~ 2010 X 2    Social History   Socioeconomic History   Marital status: Divorced    Spouse name: Not on file   Number of children: 6   Years of education: 14   Highest education level: Some college, no degree  Occupational History   Occupation: sales/ DJ    Comment: part time  Tobacco Use   Smoking status: Some Days    Types: Pipe, Cigars   Smokeless tobacco: Never   Tobacco comments:    cigar 1-2 times/[place]  Vaping Use   Vaping Use: Never used  Substance and Sexual Activity   Alcohol use: Yes    Comment:  1-2 drinks per year.   Drug use: No   Sexual activity: Yes  Other Topics Concern   Not on file  Social History Narrative   Lives with his son and his daughter visits often. He does Lobbyist, enjoys singing, music and yard work (when he can).    Social Determinants of Health   Financial Resource Strain: Low Risk    Difficulty of Paying Living Expenses: Not hard at all  Food Insecurity: No Food Insecurity   Worried About Charity fundraiser in the Last Year: Never true   [place] in the Last Year: Never true  Transportation Needs: No Transportation Needs   Lack of Transportation (Medical): No   Lack of  Transportation (Non-Medical): No  Physical Activity: Inactive   Days of Exercise per Week: 0 days   Minutes of Exercise per Session: 0 min  Stress: No Stress Concern Present   Feeling of Stress : Not at all  Social Connections: Moderately Isolated   Frequency of Communication with Friends and Family: More than three times a week   Frequency of Social Gatherings with Friends and Family: More than three times a week   Attends Religious Services: More than 4 times per year   Active Member of [employer] or Organizations: No   Attends Archivist Meetings: Never   Marital Status: Divorced    Family History  Problem Relation Age of Onset   Cancer Mother 30       Lung   Alzheimer's disease Father    Parkinson's disease Father    Scoliosis Sister    Cleft lip Sister     Health Maintenance  Topic Date Due   COVID-19 Vaccine (3 - Pfizer risk series) 12/08/2019   Zoster Vaccines- Shingrix (2 of 2) 10/10/2021 (Originally 07/05/2021)   COLONOSCOPY (Pts 45-29yrs Insurance coverage will need to be confirmed)  06/03/2029   TETANUS/TDAP  05/11/2031   Pneumonia Vaccine 46+ Years old  Completed   INFLUENZA VACCINE  Completed   Hepatitis C Screening  Completed   HPV VACCINES  Aged Out     ----------------------------------------------------------------------------------------------------------------------------------------------------------------------------------------------------------------- Physical Exam BP 135/80    Pulse 90    Temp 97.6 F (36.4 C)    Ht 5\' 5"  (1.651 m)    Wt 264 lb (119.7 kg)    SpO2 96%    BMI 43.93 kg/m   Physical Exam Constitutional:      Appearance: Normal appearance.  Eyes:     General: No scleral icterus. Cardiovascular:     Rate and Rhythm: Normal rate and regular rhythm.  Pulmonary:     Effort: Pulmonary effort is normal.     Breath sounds: Normal breath sounds.  Musculoskeletal:     Cervical back: Neck supple.  Neurological:     General: No  focal deficit present.     Mental Status: He is alert.  Psychiatric:        Mood and Affect: Mood normal.        Behavior: Behavior normal.    ------------------------------------------------------------------------------------------------------------------------------------------------------------------------------------------------------------------- Assessment and Plan  Essential hypertension, benign Blood pressure stable at this time with current medications.  Recommend continuation.  Updating labs today.  Hypogonadism in male Feels good with current dose of testosterone and anastrozole.  Update testosterone, CBC, PSA and estradiol levels.  Parkinsonism [facility]) Management per neurology.  Stable with Requip at this time.  Prediabetes Update A1c today.  Anxiety state Stable with BuSpar daily and alprazolam as needed.   No orders  of the defined types were placed in this encounter.   Return in about 6 months (around 04/03/2022) for HTN/HLD/Testosterone.    This visit occurred during the SARS-CoV-2 public health emergency.  Safety protocols were in place, including screening questions prior to the visit, additional usage of staff PPE, and extensive cleaning of exam room while observing appropriate contact time as indicated for disinfecting solutions.

## 2021-10-05 LAB — VITAMIN D 25 HYDROXY (VIT D DEFICIENCY, FRACTURES): Vit D, 25-Hydroxy: 29 ng/mL — ABNORMAL LOW (ref 30–100)

## 2021-10-05 LAB — LIPID PANEL W/REFLEX DIRECT LDL
Cholesterol: 131 mg/dL (ref <200)
HDL: 38 mg/dL — ABNORMAL LOW (ref >=40)
LDL Cholesterol (Calc): 75 mg/dL (calc)
Non-HDL Cholesterol (Calc): 93 mg/dL (calc) (ref <130)
Total CHOL/HDL Ratio: 3.4 (calc) (ref <5.0)
Triglycerides: 93 mg/dL (ref <150)

## 2021-10-05 LAB — CBC WITH DIFFERENTIAL/PLATELET
Absolute Monocytes: 504 cells/uL (ref 200–950)
Basophils Absolute: 32 cells/uL (ref 0–200)
Basophils Relative: 0.6 %
Eosinophils Absolute: 159 cells/uL (ref 15–500)
Eosinophils Relative: 3 %
HCT: 48.8 % (ref 38.5–50.0)
Hemoglobin: 16.2 g/dL (ref 13.2–17.1)
Lymphs Abs: 2369 cells/uL (ref 850–3900)
MCH: 29.2 pg (ref 27.0–33.0)
MCHC: 33.2 g/dL (ref 32.0–36.0)
MCV: 88.1 fL (ref 80.0–100.0)
MPV: 10.1 fL (ref 7.5–12.5)
Monocytes Relative: 9.5 %
Neutro Abs: 2237 cells/uL (ref 1500–7800)
Neutrophils Relative %: 42.2 %
Platelets: 200 10*3/uL (ref 140–400)
RBC: 5.54 10*6/uL (ref 4.20–5.80)
RDW: 12.6 % (ref 11.0–15.0)
Total Lymphocyte: 44.7 %
WBC: 5.3 10*3/uL (ref 3.8–10.8)

## 2021-10-05 LAB — COMPLETE METABOLIC PANEL WITH GFR
AG Ratio: 1.6 (calc) (ref 1.0–2.5)
ALT: 38 U/L (ref 9–46)
AST: 23 U/L (ref 10–35)
Albumin: 4.4 g/dL (ref 3.6–5.1)
Alkaline phosphatase (APISO): 74 U/L (ref 35–144)
BUN: 21 mg/dL (ref 7–25)
CO2: 29 mmol/L (ref 20–32)
Calcium: 10 mg/dL (ref 8.6–10.3)
Chloride: 104 mmol/L (ref 98–110)
Creat: 1.13 mg/dL (ref 0.70–1.35)
Globulin: 2.7 g/dL (calc) (ref 1.9–3.7)
Glucose, Bld: 120 mg/dL — ABNORMAL HIGH (ref 65–99)
Potassium: 4.6 mmol/L (ref 3.5–5.3)
Sodium: 142 mmol/L (ref 135–146)
Total Bilirubin: 1.9 mg/dL — ABNORMAL HIGH (ref 0.2–1.2)
Total Protein: 7.1 g/dL (ref 6.1–8.1)
eGFR: 70 mL/min/{1.73_m2} (ref >=60)

## 2021-10-05 LAB — HEMOGLOBIN A1C
Hgb A1c MFr Bld: 7.6 % of total Hgb — ABNORMAL HIGH (ref <5.7)
Mean Plasma Glucose: 171 mg/dL
eAG (mmol/L): 9.5 mmol/L

## 2021-10-05 LAB — ESTRADIOL: Estradiol: <15 pg/mL (ref <=39)

## 2021-10-05 LAB — TESTOSTERONE: Testosterone: 609 ng/dL (ref 250–827)

## 2021-10-05 LAB — PSA: PSA: 2.94 ng/mL (ref <=4.00)

## 2021-10-12 ENCOUNTER — Other Ambulatory Visit: Payer: Self-pay | Admitting: Family Medicine

## 2021-10-12 MED ORDER — METFORMIN HCL ER 500 MG PO TB24
500.0000 mg | ORAL_TABLET | Freq: Every day | ORAL | 0 refills | Status: DC
Start: 2021-10-12 — End: 2022-03-06

## 2021-10-16 ENCOUNTER — Ambulatory Visit (INDEPENDENT_AMBULATORY_CARE_PROVIDER_SITE_OTHER): Payer: Medicare HMO

## 2021-10-16 ENCOUNTER — Ambulatory Visit: Payer: Medicare HMO | Admitting: Orthopedic Surgery

## 2021-10-16 ENCOUNTER — Ambulatory Visit: Payer: Self-pay

## 2021-10-16 DIAGNOSIS — M25572 Pain in left ankle and joints of left foot: Secondary | ICD-10-CM

## 2021-10-16 DIAGNOSIS — G8929 Other chronic pain: Secondary | ICD-10-CM

## 2021-10-16 DIAGNOSIS — M25571 Pain in right ankle and joints of right foot: Secondary | ICD-10-CM | POA: Diagnosis not present

## 2021-10-16 DIAGNOSIS — G20 Parkinson's disease: Secondary | ICD-10-CM

## 2021-10-17 ENCOUNTER — Encounter: Payer: Self-pay | Admitting: Family Medicine

## 2021-10-17 DIAGNOSIS — I10 Essential (primary) hypertension: Secondary | ICD-10-CM

## 2021-10-17 MED ORDER — RYBELSUS 3 MG PO TABS: 3.0000 mg | ORAL_TABLET | Freq: Every day | ORAL | 0 refills | Status: DC

## 2021-10-17 MED ORDER — RYBELSUS 7 MG PO TABS: 7.0000 mg | ORAL_TABLET | Freq: Every day | ORAL | 1 refills | Status: DC

## 2021-10-22 ENCOUNTER — Other Ambulatory Visit: Payer: Self-pay | Admitting: Family Medicine

## 2021-10-22 ENCOUNTER — Encounter: Payer: Self-pay | Admitting: Orthopedic Surgery

## 2021-10-22 DIAGNOSIS — N400 Enlarged prostate without lower urinary tract symptoms: Secondary | ICD-10-CM
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
## 2021-10-25 ENCOUNTER — Encounter: Payer: Self-pay | Admitting: Family Medicine

## 2021-10-25 ENCOUNTER — Other Ambulatory Visit: Payer: Self-pay

## 2021-10-25 ENCOUNTER — Ambulatory Visit (INDEPENDENT_AMBULATORY_CARE_PROVIDER_SITE_OTHER): Payer: Medicare HMO | Admitting: Family Medicine

## 2021-10-25 VITALS — BP 134/76 | HR 77

## 2021-10-25 DIAGNOSIS — I10 Essential (primary) hypertension: Secondary | ICD-10-CM | POA: Diagnosis not present

## 2021-10-25 DIAGNOSIS — E291 Testicular hypofunction: Secondary | ICD-10-CM | POA: Diagnosis not present

## 2021-10-25 NOTE — Progress Notes (Signed)
Patient is here for testosterone injection. Denies chest pain, shortness of breath, headaches, and problems with medication or mood changes.   Patient tolerated testosterone 200 mg injection to RUOQ well without complications. Patient advised to schedule next injection in 21 days.   

## 2021-10-25 NOTE — Progress Notes (Signed)
Medical screening examination/treatment was performed by qualified clinical staff member and as supervising physician I was immediately available for consultation/collaboration. I have reviewed documentation and agree with assessment and plan.  Doratha Mcswain, DO  

## 2021-10-26 LAB — BASIC METABOLIC PANEL
BUN: 17 mg/dL (ref 7–25)
CO2: 28 mmol/L (ref 20–32)
Calcium: 10 mg/dL (ref 8.6–10.3)
Chloride: 104 mmol/L (ref 98–110)
Creat: 1.14 mg/dL (ref 0.70–1.35)
Glucose, Bld: 137 mg/dL — ABNORMAL HIGH (ref 65–99)
Potassium: 4.7 mmol/L (ref 3.5–5.3)
Sodium: 142 mmol/L (ref 135–146)

## 2021-10-27 ENCOUNTER — Encounter: Payer: Self-pay | Admitting: Family Medicine

## 2021-10-30 ENCOUNTER — Ambulatory Visit (INDEPENDENT_AMBULATORY_CARE_PROVIDER_SITE_OTHER): Payer: Medicare HMO | Admitting: Neurology

## 2021-10-30 DIAGNOSIS — G4719 Other hypersomnia: Secondary | ICD-10-CM

## 2021-10-30 DIAGNOSIS — G4720 Circadian rhythm sleep disorder, unspecified type: Secondary | ICD-10-CM

## 2021-10-30 DIAGNOSIS — G4733 Obstructive sleep apnea (adult) (pediatric): Secondary | ICD-10-CM

## 2021-10-30 DIAGNOSIS — G20 Parkinson's disease: Secondary | ICD-10-CM

## 2021-10-30 DIAGNOSIS — M25471 Effusion, right ankle: Secondary | ICD-10-CM

## 2021-10-30 DIAGNOSIS — G20A1 Parkinson's disease without dyskinesia, without mention of fluctuations: Secondary | ICD-10-CM

## 2021-10-30 DIAGNOSIS — R351 Nocturia: Secondary | ICD-10-CM

## 2021-10-30 DIAGNOSIS — M25472 Effusion, left ankle: Secondary | ICD-10-CM

## 2021-11-07 ENCOUNTER — Other Ambulatory Visit: Payer: Self-pay | Admitting: Family Medicine

## 2021-11-09 NOTE — Procedures (Signed)
PATIENT'S NAME:  Jonathan Yu, Jonathan Yu ?DOB:      [birth year]      ?MR#:    [id number]     ?DATE OF RECORDING: 10/30/2021 ?REFERRING M.D.:  Luetta Nutting, DO ?Study Performed:   Baseline Polysomnogram ?HISTORY: 71 year old man with a history of degenerative disc disease, hyperlipidemia, hypertension, diabetes, vitamin D deficiency, obstructive sleep apnea (on PAP therapy), arthritis, status post multiple surgeries, allergies, low testosterone, PD and severe obesity with a BMI of over 40, who presents for evaluation of his obstructive sleep apnea. He has an older CPAP machine, pressure at 13 cm. The patient endorsed the Epworth Sleepiness Scale at 12 points. The patient's weight 261 pounds with a height of 66 (inches), resulting in a BMI of 41.8 kg/m2.  ? ?CURRENT MEDICATIONS: Proventil HFA, Xanax, Arimidex, Lipitor, Azelaic Acid, Buspar, Colace, Flonase, Lasix, Claritin, Cozaar, Flex Therapy, Multivitamin, Probiotic, Requip, Cialis, Flomax, Vitamin D ?  ?PROCEDURE:  This is a multichannel digital polysomnogram utilizing the Somnostar 11.2 system.  Electrodes and sensors were applied and monitored per AASM Specifications.   EEG, EOG, Chin and Limb EMG, were sampled at 200 Hz.  ECG, Snore and Nasal Pressure, Thermal Airflow, Respiratory Effort, CPAP Flow and Pressure, Oximetry was sampled at 50 Hz. Digital video and audio were recorded.     ? ?BASELINE STUDY ? ?Lights Out was at 21:52 and Lights On at 04:34.  Total recording time (TRT) was 402.5 minutes, with a total sleep time (TST) of 327.5 minutes.   The patient's sleep latency was 7.5 minutes.  REM latency was 279 minutes, which is a markedly delayed. The sleep efficiency was 81.4 %.  ?   ?SLEEP ARCHITECTURE: WASO (Wake after sleep onset) was 64 minutes with minimal to mild sleep fragmentation noted. There were 17.5 minutes in Stage N1, 284 minutes Stage N2, 0 minutes Stage N3 and 26 minutes in Stage REM.  The percentage of Stage N1 was 5.3%, Stage N2 was 86.7%, which is  markedly increased, Stage N3 was absent, and stage R (REM sleep) was 7.9%, which is reduced. The arousals were noted as: 25 were spontaneous, 0 were associated with PLMs, 0 were associated with respiratory events. ? ?RESPIRATORY ANALYSIS:  There were a total of 33 respiratory events:  4 obstructive apneas, 1 central apneas and 0 mixed apneas with a total of 5 apneas and an apnea index (AI) of .9 /hour. There were 28 hypopneas with a hypopnea index of 5.1 /hour. The patient also had 0 respiratory event related arousals (RERAs).  ?    ?The total APNEA/HYPOPNEA INDEX (AHI) was 6./hour and the total RESPIRATORY DISTURBANCE INDEX was  6. /hour.  1 events occurred in REM sleep and 57 events in NREM. The REM AHI was  2.3 /hour, versus a non-REM AHI of 6.4. The patient spent 327.5 minutes of total sleep time in the supine position and 0 minutes in non-supine.. The supine AHI was 6.0 versus a non-supine AHI of 0.0. ? ?OXYGEN SATURATION & C02:  The Wake baseline 02 saturation was 91%, with the lowest being 87%. Time spent below 89% saturation equaled 1 minutes. ?PERIODIC LIMB MOVEMENTS: The patient had a total of 0 Periodic Limb Movements.  The Periodic Limb Movement (PLM) index was 0 and the PLM Arousal index was 0/hour. ? ?Audio and video analysis did not show any abnormal or unusual movements, behaviors, phonations or vocalizations. The patient took 1 bathroom break. Mild to moderate snoring was noted. The EKG was in keeping with normal sinus rhythm (  NSR). ? ?Post-study, the patient indicated that sleep was the same as usual.  ? ?IMPRESSION: ? ?Obstructive Sleep Apnea (OSA) ?Dysfunctions associated with sleep stages or arousal from sleep ? ?RECOMMENDATIONS: ? ?This study demonstrates overall mild obstructive sleep apnea, with a total AHI of 6/hour, and O2 nadir of 87%. The reduced percentage of REM sleep may underestimate his AHI and O2 nadir. Given the patient's medical history and sleep related complaints as well as  established treatment with positive airway pressure, I recommended ongoing treatment with a new autoPAP machine. Alternative treatments may include avoidance of supine sleep position along with weight loss, or the use of an oral appliance in certain patients. Please note that untreated obstructive sleep apnea may carry additional perioperative morbidity. Patients with significant obstructive sleep apnea should receive perioperative PAP therapy and the surgeons and particularly the anesthesiologist should be informed of the diagnosis and the severity of the sleep disordered breathing. ?This study shows sleep fragmentation and abnormal sleep stage percentages; these are nonspecific findings and per se do not signify an intrinsic sleep disorder or a cause for the patient's sleep-related symptoms. Causes include (but are not limited to) the first night effect of the sleep study, circadian rhythm disturbances, medication effect or an underlying mood disorder or medical problem.  ?The patient should be cautioned not to drive, work at heights, or operate dangerous or heavy equipment when tired or sleepy. Review and reiteration of good sleep hygiene measures should be pursued with any patient. ?The patient will be seen in follow-up by Dr. Rexene Alberts at [facility] for discussion of the test results and further management strategies. The referring provider will be notified of the test results. ? ?I certify that I have reviewed the entire raw data recording prior to the issuance of this report in accordance with the Standards of Accreditation of the [place] [employer] (AASM) ? ? ?Star Age, MD, PhD ?Diplomat, American Board of Neurology and Sleep Medicine (Neurology and Sleep Medicine) ? ? ? ?

## 2021-11-09 NOTE — Addendum Note (Signed)
Addended by: Star Age on: 11/09/2021 09:53 AM ? ? Modules accepted: Orders ? ?

## 2021-11-12 ENCOUNTER — Telehealth: Payer: Self-pay | Admitting: *Deleted

## 2021-11-12 DIAGNOSIS — G4733 Obstructive sleep apnea (adult) (pediatric): Secondary | ICD-10-CM

## 2021-11-12 NOTE — Telephone Encounter (Signed)
-----   Message from Star Age, MD sent at 11/09/2021  9:51 AM EDT ----- ?Patient had a recent sleep study for re-eval of his OSA. He has an older Magazine features editor. He also has a newer machine, which he has not used. We can check with his DME, if that machine is on recall. He has overall mild OSA, but I recommend he continue with treatment. I can write for autoPAP, he may be able to get a new, Resmed Machine. Please go over the compliance criteria, he has been compliant with his CPAP of 13 cm. ?

## 2021-11-12 NOTE — Telephone Encounter (Signed)
Order ready for Dr Guadelupe Sabin signature, then place at front desk for patient. ?

## 2021-11-12 NOTE — Telephone Encounter (Signed)
Spoke with the patient and discussed his sleep study results.  Patient aware that sleep study overall showed a mild OSA but the recommendation is to continue with treatment.  We discussed compliance requirements per insurance.  Patient states his [employer] machine that he had received 2 years ago was brand-new but unfortunately recalled.  He has paperwork ready and states Hardin Negus is supposed to send him a new machine.  Order would need to be rewritten to specify [employer].  He states if he has any trouble with that he would be willing to switch to a ResMed in the future but in this case he would not have to pay for another machine.  I let him know we would address this as soon as possible and have the order ready up front for pickup as he needs to include this with his paperwork.  ?

## 2021-11-12 NOTE — Addendum Note (Signed)
Addended by: Gildardo Griffes on: 11/12/2021 10:30 AM ? ? Modules accepted: Orders ? ?

## 2021-11-13 ENCOUNTER — Encounter: Payer: Self-pay | Admitting: Neurology

## 2021-11-13 NOTE — Telephone Encounter (Signed)
Order signed and placed at front desk for patient pickup.  ?

## 2021-11-13 NOTE — Telephone Encounter (Signed)
Order has been signed and placed up front for the patient to pickup. I did send the orders to pt's DME as an FYI as well. Received a receipt of confirmation. ? ?

## 2021-11-13 NOTE — Telephone Encounter (Signed)
Autopap order, office note, sleep study results faxed to [facility] as FYI. Note included that patient will be sending his order directly to [facility] so he can get the replacement machine he is due. Received a receipt of confirmation. ? ?

## 2021-11-15 ENCOUNTER — Ambulatory Visit (INDEPENDENT_AMBULATORY_CARE_PROVIDER_SITE_OTHER): Payer: Medicare HMO | Admitting: Medical-Surgical

## 2021-11-15 VITALS — BP 112/73 | HR 72 | Ht 66.0 in | Wt 264.5 lb

## 2021-11-15 DIAGNOSIS — E291 Testicular hypofunction: Secondary | ICD-10-CM | POA: Diagnosis not present

## 2021-11-15 NOTE — Progress Notes (Signed)
Pt here for testosterone injection no SOB,CP or mood swings. Injection tolerated well given in LUOQ. Pt to RTC in 3 weeks for next injection.  ?

## 2021-11-15 NOTE — Progress Notes (Signed)
Agree with documentation as above.  ? ?___________________________________________ ?Kairie Vangieson L. Darrien Belter, DNP, APRN, FNP-BC ?Primary Care and Sports Medicine ?[facility] ? ?

## 2021-12-04 ENCOUNTER — Telehealth: Payer: Self-pay | Admitting: Neurology

## 2021-12-04 NOTE — Telephone Encounter (Signed)
?  Pt states he spoke with [facility] today 4/25. ?Hardin Negus informed pt have left a message with [facility] office and need verbal conformation to send machine to pt.  ?They advised pt to call the [facility] office to make office aware. ?Phone Number: [phone] ?Serial number on CPAP: [id number] ?

## 2021-12-05 NOTE — Telephone Encounter (Signed)
Called [facility] and answered questions regarding replacement of CPAP machine. They will fax a prescription form to our office and once that is completed they will ship out a replacement machine to the patient as soon as possible.  ?

## 2021-12-06 ENCOUNTER — Ambulatory Visit (INDEPENDENT_AMBULATORY_CARE_PROVIDER_SITE_OTHER): Payer: Medicare HMO | Admitting: Family Medicine

## 2021-12-06 VITALS — BP 122/71 | HR 80

## 2021-12-06 DIAGNOSIS — E291 Testicular hypofunction: Secondary | ICD-10-CM | POA: Diagnosis not present

## 2021-12-06 MED ORDER — TESTOSTERONE CYPIONATE 200 MG/ML IM SOLN
200.0000 mg | Freq: Once | INTRAMUSCULAR | Status: AC
  Administered 2021-12-06: 200 mg via INTRAMUSCULAR

## 2021-12-06 NOTE — Progress Notes (Signed)
Patient is here for testosterone injection. Denies chest pain, shortness of breath, headaches, and problems with medication or mood changes.   Patient tolerated testosterone 200 mg injection to RUOQ well without complications. Patient advised to schedule next injection in 21 days.   

## 2021-12-06 NOTE — Telephone Encounter (Signed)
CPAP prescription form received from [facility]. Form completed requesting Auto-CPAP pressure 7-13 cm, then signed by Dr Rexene Alberts. Form faxed to [facility]. Received a receipt of confirmation. ? ?

## 2021-12-06 NOTE — Progress Notes (Signed)
Medical screening examination/treatment was performed by qualified clinical staff member and as supervising physician I was immediately available for consultation/collaboration. I have reviewed documentation and agree with assessment and plan.  Alecia Doi, DO  

## 2021-12-27 ENCOUNTER — Ambulatory Visit (INDEPENDENT_AMBULATORY_CARE_PROVIDER_SITE_OTHER): Payer: Medicare HMO | Admitting: Family Medicine

## 2021-12-27 VITALS — BP 127/73 | HR 72

## 2021-12-27 DIAGNOSIS — E291 Testicular hypofunction: Secondary | ICD-10-CM

## 2021-12-27 MED ORDER — TESTOSTERONE CYPIONATE 200 MG/ML IM SOLN
200.0000 mg | Freq: Once | INTRAMUSCULAR | Status: AC
  Administered 2021-12-27: 200 mg via INTRAMUSCULAR

## 2021-12-27 NOTE — Progress Notes (Signed)
HPI:  Patient is here for a testosterone injection.  Denies chest pains, shortness of breath, headaches and problems with medication or mood changes.  Assessment and Plan:  Patient tolerated injection well without complications.  Patient advised to schedule next injection in 14 days.  Pt also c/o BLE edema, Left greater than right, with pain.  Charyl Bigger, CMA (AAMA)

## 2021-12-27 NOTE — Progress Notes (Signed)
Medical screening examination/treatment was performed by qualified clinical staff member and as supervising physician I was immediately available for consultation/collaboration. I have reviewed documentation and agree with assessment and plan.  Fredna Stricker, DO  

## 2022-01-10 ENCOUNTER — Other Ambulatory Visit: Payer: Self-pay | Admitting: Neurology

## 2022-01-11 ENCOUNTER — Other Ambulatory Visit: Payer: Self-pay | Admitting: Family Medicine

## 2022-01-15 DIAGNOSIS — H5213 Myopia, bilateral: Secondary | ICD-10-CM | POA: Diagnosis not present

## 2022-01-15 DIAGNOSIS — H16203 Unspecified keratoconjunctivitis, bilateral: Secondary | ICD-10-CM | POA: Diagnosis not present

## 2022-01-15 DIAGNOSIS — H52223 Regular astigmatism, bilateral: Secondary | ICD-10-CM | POA: Diagnosis not present

## 2022-01-15 DIAGNOSIS — H25813 Combined forms of age-related cataract, bilateral: Secondary | ICD-10-CM | POA: Diagnosis not present

## 2022-01-16 ENCOUNTER — Ambulatory Visit: Payer: Medicare HMO

## 2022-01-16 ENCOUNTER — Ambulatory Visit (INDEPENDENT_AMBULATORY_CARE_PROVIDER_SITE_OTHER): Payer: Medicare HMO | Admitting: Family Medicine

## 2022-01-16 ENCOUNTER — Encounter: Payer: Self-pay | Admitting: Family Medicine

## 2022-01-16 VITALS — BP 145/88 | HR 65 | Ht 66.0 in | Wt 268.3 lb

## 2022-01-16 DIAGNOSIS — I10 Essential (primary) hypertension: Secondary | ICD-10-CM

## 2022-01-16 DIAGNOSIS — G20 Parkinson's disease: Secondary | ICD-10-CM

## 2022-01-16 DIAGNOSIS — M722 Plantar fascial fibromatosis: Secondary | ICD-10-CM

## 2022-01-16 DIAGNOSIS — E291 Testicular hypofunction: Secondary | ICD-10-CM | POA: Diagnosis not present

## 2022-01-16 DIAGNOSIS — R7303 Prediabetes: Secondary | ICD-10-CM | POA: Diagnosis not present

## 2022-01-16 DIAGNOSIS — E119 Type 2 diabetes mellitus without complications: Secondary | ICD-10-CM

## 2022-01-16 LAB — POCT GLYCOSYLATED HEMOGLOBIN (HGB A1C): HbA1c, POC (controlled diabetic range): 7.1 % — AB (ref 0.0–7.0)

## 2022-01-16 NOTE — Assessment & Plan Note (Signed)
Symptoms are consistent with plantars fasciitis.  Recommend use of supportive shoes with good arch support.  He could also try the arch support straps.  Additionally he seems to have some mild Achilles tendinitis.  Given heel lifts to try in his crocs

## 2022-01-16 NOTE — Assessment & Plan Note (Signed)
He is doing well with metformin at this time.  A1c is reduced since last visit.  Continue to work on dietary changes.

## 2022-01-16 NOTE — Patient Instructions (Addendum)
Great to see you today! Continue current medications.  Try arch straps.  Try heel lift on left side when wearing crocs.  See me again in 6 months or sooner if needed.

## 2022-01-16 NOTE — Assessment & Plan Note (Signed)
Blood pressure is mildly elevated today.  Reported readings at home are better.  Recommend continuation of current medications for management of hypertension.

## 2022-01-16 NOTE — Progress Notes (Signed)
Jonathan Yu - 71 y.o. male MRN [id number]  Date of birth: 15-May-1951  Subjective Chief Complaint  Patient presents with   Foot Pain   Diabetes   Hypogonadism    .    HPI Jonathan Yu is a 71 year old male here today for follow-up visit.  Reports overall he is doing well.  He is having some left foot and ankle pain.  Left foot and ankle pain began a couple of weeks ago.  Pain is located at the base of the heel does radiate to the back of the heel at times.  This is worse in the morning when he first gets up and improves as he walks throughout the day.  He does not recall any specific injury.  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  Typically does not wear very supportive shoes, crocs most of the time.  Blood sugars have been doing pretty well.  He is taking metformin daily.  Tolerating well.  His blood pressures remain well controlled.  No side effects with losartan.  Denies chest pain, shortness of breath, palpitations, headaches or vision changes.  Continues to see neurology for parkinsonism.  Requip seems to be working pretty well for him.  Tremor has lessened.  He does notice this more at night.  Feels good with current testosterone dosing.  ROS:  A comprehensive ROS was completed and negative except as noted per HPI  Allergies  Allergen Reactions   Sesame Oil Hives and Shortness Of Breath   Clonazepam Hives   Tape Other (See Comments)    White surgical tape caused some blisters.   Percocet [Oxycodone-Acetaminophen] Rash   Soy Allergy Hives   Topamax [Topiramate]     Cognitive decline   Vicodin [Hydrocodone-Acetaminophen] Rash    Past Medical History:  Diagnosis Date   Allergy    DDD (degenerative disc disease), lumbar    Hyperlipidemia    "no meds since losing 106# 2 yr ago" (10/06/2013)   Hypertension 08/12/2010   "no meds since losing 106# 2 yr ago" (10/06/2013)   OSA on CPAP    "don't wear mask much since losing 106# 2 yr ago" (10/06/2013)    Parkinson's disease ([place])    Type II diabetes mellitus ([place])    "no meds since losing 106# 2 yr ago" (10/06/2013)   Walking pneumonia 08/12/1966    Past Surgical History:  Procedure Laterality Date   ANKLE FRACTURE SURGERY Left 1973   ANKLE RECONSTRUCTION Right 05/2011   CARPAL TUNNEL RELEASE Bilateral 2001   HARDWARE REMOVAL Right 10/07/2013   Procedure: RIGHT ANKLE HARDWARE REMOVAL;  Surgeon: Newt Minion, MD;  Location: [facility];  Service: Orthopedics;  Laterality: Right;   I & D EXTREMITY Right 11/18/2013   Procedure: IRRIGATION AND DEBRIDEMENT EXTREMITY;  Surgeon: Newt Minion, MD;  Location: [facility];  Service: Orthopedics;  Laterality: Right;  Irrigation and Debridement Right Fibula , Place Antibiotic Beads and VAC   NASAL SEPTUM SURGERY  1982   ORIF ANKLE FRACTURE Right 09/08/2013   Procedure: REPAIR SYNDESMOSIS DISRUPTION RIGHT ANKLE;  Surgeon: Newt Minion, MD;  Location: [facility];  Service: Orthopedics;  Laterality: Right;   SHOULDER ARTHROSCOPY W/ ROTATOR CUFF REPAIR Right 2006; 2009   TONSILLECTOMY  1959   TOTAL ANKLE ARTHROPLASTY Right 08/18/2013   Procedure: TOTAL ANKLE ARTHOPLASTY;  Surgeon: Newt Minion, MD;  Location: [facility];  Service: Orthopedics;  Laterality: Right;  Right Total Ankle Arthroplasty, Revision Fibular Fracture  URETERAL STENT PLACEMENT  ~ 2010 X 2    Social History   Socioeconomic History   Marital status: Divorced    Spouse name: Not on file   Number of children: 6   Years of education: 14   Highest education level: Some college, no degree  Occupational History   Occupation: sales/ DJ    Comment: part time  Tobacco Use   Smoking status: Some Days    Types: Pipe, Cigars   Smokeless tobacco: Never   Tobacco comments:    cigar 1-2 times/[place]  Vaping Use   Vaping Use: Never used  Substance and Sexual Activity   Alcohol use: Yes    Comment: 1-2 drinks per year.   Drug use: No   Sexual activity: Yes  Other Topics Concern   Not on file  Social  History Narrative   Lives with his son and his daughter visits often. He does Lobbyist, enjoys singing, music and yard work (when he can).    Social Determinants of Health   Financial Resource Strain: Low Risk    Difficulty of Paying Living Expenses: Not hard at all  Food Insecurity: No Food Insecurity   Worried About Charity fundraiser in the Last Year: Never true   [place] in the Last Year: Never true  Transportation Needs: No Transportation Needs   Lack of Transportation (Medical): No   Lack of Transportation (Non-Medical): No  Physical Activity: Inactive   Days of Exercise per Week: 0 days   Minutes of Exercise per Session: 0 min  Stress: No Stress Concern Present   Feeling of Stress : Not at all  Social Connections: Moderately Isolated   Frequency of Communication with Friends and Family: More than three times a week   Frequency of Social Gatherings with Friends and Family: More than three times a week   Attends Religious Services: More than 4 times per year   Active Member of [employer] or Organizations: No   Attends Archivist Meetings: Never   Marital Status: Divorced    Family History  Problem Relation Age of Onset   Cancer Mother 15       Lung   Alzheimer's disease Father    Parkinson's disease Father    Scoliosis Sister    Cleft lip Sister     Health Maintenance  Topic Date Due   Zoster Vaccines- Shingrix (2 of 2) 07/17/2022 (Originally 07/05/2021)   COVID-19 Vaccine (3 - Pfizer risk series) 11/16/2022 (Originally 12/08/2019)   INFLUENZA VACCINE  03/12/2022   COLONOSCOPY (Pts 45-91yr Insurance coverage will need to be confirmed)  06/03/2029   TETANUS/TDAP  05/11/2031   Pneumonia Vaccine 71 Years old  Completed   Hepatitis C Screening  Completed   HPV VACCINES  Aged Out      ----------------------------------------------------------------------------------------------------------------------------------------------------------------------------------------------------------------- Physical Exam BP (!) 145/88 (BP Location: Right Arm, Patient Position: Sitting, Cuff Size: Large)   Pulse 65   Ht '5\' 6"'$  (1.676 m)   Wt 268 lb 4.8 oz (121.7 kg)   SpO2 93%   BMI 43.30 kg/m   Physical Exam Constitutional:      Appearance: Normal appearance.  Eyes:     General: No scleral icterus. Cardiovascular:     Rate and Rhythm: Normal rate and regular rhythm.  Pulmonary:     Effort: Pulmonary effort is normal.     Breath sounds: Normal breath sounds.  Musculoskeletal:     Cervical back: Neck supple.     Comments:  Tenderness palpation of the base of the calcaneus.  Mild tenderness along posterior Achilles.  Neurological:     Mental Status: He is alert.  Psychiatric:        Mood and Affect: Mood normal.        Behavior: Behavior normal.    ------------------------------------------------------------------------------------------------------------------------------------------------------------------------------------------------------------------- Assessment and Plan  Essential hypertension, benign Blood pressure is mildly elevated today.  Reported readings at home are better.  Recommend continuation of current medications for management of hypertension.  Parkinsonism [facility]) Management per neurology.  Stable at this time.  Type 2 diabetes mellitus without complication, without long-term current use of insulin ([place]) He is doing well with metformin at this time.  A1c is reduced since last visit.  Continue to work on dietary changes.  Plantar fasciitis of left foot Symptoms are consistent with plantars fasciitis.  Recommend use of supportive shoes with good arch support.  He could also try the arch support straps.  Additionally he seems to have some mild Achilles  tendinitis.  Given heel lifts to try in his crocs  Hypogonadism in male Doing well with current dose of testosterone.  We will plan to continue at this time.   No orders of the defined types were placed in this encounter.   Return in about 6 months (around 07/18/2022) for HTN.  30 minutes spent including pre visit preparation, review of prior notes and labs, encounter with patient and same day documentation.   This visit occurred during the SARS-CoV-2 public health emergency.  Safety protocols were in place, including screening questions prior to the visit, additional usage of staff PPE, and extensive cleaning of exam room while observing appropriate contact time as indicated for disinfecting solutions.

## 2022-01-16 NOTE — Progress Notes (Signed)
Patient is here for a testosterone injection of 200mg/ml.  Given in RUOQ.  Denies chest pain, shortness of breath, headaches and problems with medication or mood changes.  Tolerated injection well without complications.   Patient advised to schedule next injection in 14 days.  

## 2022-01-16 NOTE — Assessment & Plan Note (Signed)
Doing well with current dose of testosterone.  We will plan to continue at this time.

## 2022-01-16 NOTE — Assessment & Plan Note (Signed)
Management per neurology.  Stable at this time. 

## 2022-01-17 ENCOUNTER — Ambulatory Visit: Payer: Medicare HMO

## 2022-02-04 ENCOUNTER — Other Ambulatory Visit: Payer: Self-pay | Admitting: Family Medicine

## 2022-02-05 ENCOUNTER — Ambulatory Visit: Payer: Medicare HMO | Admitting: Neurology

## 2022-02-06 ENCOUNTER — Ambulatory Visit (INDEPENDENT_AMBULATORY_CARE_PROVIDER_SITE_OTHER): Payer: Medicare HMO | Admitting: Sports Medicine

## 2022-02-06 ENCOUNTER — Ambulatory Visit (INDEPENDENT_AMBULATORY_CARE_PROVIDER_SITE_OTHER): Payer: Medicare HMO

## 2022-02-06 DIAGNOSIS — Z96661 Presence of right artificial ankle joint: Secondary | ICD-10-CM | POA: Diagnosis not present

## 2022-02-06 DIAGNOSIS — E291 Testicular hypofunction: Secondary | ICD-10-CM | POA: Diagnosis not present

## 2022-02-06 NOTE — Progress Notes (Signed)
    Procedures performed today:    Procedure: Real-time Ultrasound Guided injection of the left tibialis posterior tendon sheath Device: Samsung HS60  Verbal informed consent obtained.  Time-out conducted.  Noted no overlying erythema, induration, or other signs of local infection.  Skin prepped in a sterile fashion.  Local anesthesia: Topical Ethyl chloride.  With sterile technique and under real time ultrasound guidance: Noted tendinopathy and tendon sheath effusion, synovitis, 1 cc Kenalog 40, 2 cc lidocaine, 2 cc bupivacaine injected easily Completed without difficulty  Advised to call if fevers/chills, erythema, induration, drainage, or persistent bleeding.  Images permanently stored and available for review in PACS.  Impression: Technically successful ultrasound guided injection.  Procedure: Real-time Ultrasound Guided injection of the right ankle joint Device: Samsung HS60  Verbal informed consent obtained.  Time-out conducted.  Noted no overlying erythema, induration, or other signs of local infection.  Skin prepped in a sterile fashion.  Local anesthesia: Topical Ethyl chloride.  With sterile technique and under real time ultrasound guidance: Ankle joint injection from anterior approach, noted significant scar tissue and difficulty with injecting the medication, 1 cc Kenalog 40, 2 cc lidocaine, 2 cc bupivacaine injected.  His tibialis posterior tendon and sheath looked normal. Completed without difficulty  Advised to call if fevers/chills, erythema, induration, drainage, or persistent bleeding.  Images permanently stored and available for review in PACS.  Impression: Technically successful ultrasound guided injection.  Independent interpretation of notes and tests performed by another provider:   Ankle x-rays reviewed, uncomplicated appearing right-sided arthroplasty, there is significant periprosthesis osteophytosis, left-sided x-rays that show significant posttraumatic  osteoarthritis.  Brief History, Exam, Impression, and Recommendations:    Chronic bilateral ankle plain, right total ankle arthroplasty, left ankle posttraumatic osteoarthritis Jonathan Yu is a very pleasant 71 year old male with Parkinson's disease, he has chronic multifactorial bilateral ankle pain, right ankle is post total arthroplasty, increasing pain medially, laterally, posteriorly. Left ankle posttraumatic osteoarthritis, tenderness predominantly over the course of the tibialis posterior. He did come in suspecting planter fasciitis but there was no pain at the plantar fascial origin. Requesting injections so today we did his left tibialis posterior tendon sheath, there was significant tendon enlargement and synovitis, injection resulted in concordant pain and immediate relief. On the right side I think his pain is coming more from the joint as well as bony impingement around his arthroplasty, I was not able to reproduce pain with firing of the tibialis posterior or at the subtalar joint. We did a left ankle "joint" injection today taking great care with sterile technique. He will do conditioning aggressively and return to see me in 6 weeks.   Hypogonadism in male Testosterone given intramuscular today.    ____________________________________________ Gwen Her. Dianah Field, M.D., ABFM., CAQSM., AME. Primary Care and Sports Medicine [facility]  Adjunct Professor of [place] of [facility] of Medicine  Risk manager

## 2022-02-06 NOTE — Assessment & Plan Note (Signed)
Jonathan Yu is a very pleasant 71 year old male with Parkinson's disease, he has chronic multifactorial bilateral ankle pain, right ankle is post total arthroplasty, increasing pain medially, laterally, posteriorly. Left ankle posttraumatic osteoarthritis, tenderness predominantly over the course of the tibialis posterior. He did come in suspecting planter fasciitis but there was no pain at the plantar fascial origin. Requesting injections so today we did his left tibialis posterior tendon sheath, there was significant tendon enlargement and synovitis, injection resulted in concordant pain and immediate relief. On the right side I think his pain is coming more from the joint as well as bony impingement around his arthroplasty, I was not able to reproduce pain with firing of the tibialis posterior or at the subtalar joint. We did a left ankle "joint" injection today taking great care with sterile technique. He will do conditioning aggressively and return to see me in 6 weeks.

## 2022-02-06 NOTE — Assessment & Plan Note (Signed)
Testosterone given intramuscular today.

## 2022-02-07 ENCOUNTER — Ambulatory Visit: Payer: Medicare HMO

## 2022-02-07 ENCOUNTER — Encounter: Payer: Medicare HMO | Admitting: Adult Health

## 2022-02-07 NOTE — Telephone Encounter (Signed)
I see pt is coming into office this afternoon.

## 2022-02-18 NOTE — Progress Notes (Signed)
This encounter was created in error - please disregard.  This encounter was created in error - please disregard.

## 2022-02-28 ENCOUNTER — Ambulatory Visit (INDEPENDENT_AMBULATORY_CARE_PROVIDER_SITE_OTHER): Payer: Medicare HMO | Admitting: Family Medicine

## 2022-02-28 VITALS — BP 115/68 | HR 75 | Ht 65.0 in | Wt 254.0 lb

## 2022-02-28 DIAGNOSIS — E291 Testicular hypofunction: Secondary | ICD-10-CM

## 2022-02-28 NOTE — Progress Notes (Signed)
Medical screening examination/treatment was performed by qualified clinical staff member and as supervising physician I was immediately available for consultation/collaboration. I have reviewed documentation and agree with assessment and plan.  Noelia Lenart, DO  

## 2022-02-28 NOTE — Progress Notes (Signed)
Patient is here for testosterone injection. Denies chest pain, shortness of breath, headaches, and problems with medication or mood changes.   Patient tolerated testosterone 200 mg injection to RUOQ well without complications. Patient advised to schedule next injection in 21 days.

## 2022-03-06 ENCOUNTER — Other Ambulatory Visit: Payer: Self-pay | Admitting: Family Medicine

## 2022-03-12 ENCOUNTER — Other Ambulatory Visit: Payer: Self-pay | Admitting: Family Medicine

## 2022-03-20 ENCOUNTER — Ambulatory Visit (INDEPENDENT_AMBULATORY_CARE_PROVIDER_SITE_OTHER): Payer: Medicare HMO | Admitting: Sports Medicine

## 2022-03-20 ENCOUNTER — Ambulatory Visit (INDEPENDENT_AMBULATORY_CARE_PROVIDER_SITE_OTHER): Payer: Medicare HMO

## 2022-03-20 ENCOUNTER — Encounter: Payer: Self-pay | Admitting: Sports Medicine

## 2022-03-20 DIAGNOSIS — E291 Testicular hypofunction: Secondary | ICD-10-CM | POA: Diagnosis not present

## 2022-03-20 DIAGNOSIS — M25572 Pain in left ankle and joints of left foot: Secondary | ICD-10-CM | POA: Diagnosis not present

## 2022-03-20 DIAGNOSIS — M19172 Post-traumatic osteoarthritis, left ankle and foot: Secondary | ICD-10-CM | POA: Diagnosis not present

## 2022-03-20 DIAGNOSIS — Z96661 Presence of right artificial ankle joint: Secondary | ICD-10-CM
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
## 2022-03-26 ENCOUNTER — Encounter: Payer: Self-pay | Admitting: Adult Health

## 2022-03-26 ENCOUNTER — Ambulatory Visit (INDEPENDENT_AMBULATORY_CARE_PROVIDER_SITE_OTHER): Payer: Medicare HMO | Admitting: Adult Health

## 2022-03-26 VITALS — BP 137/77 | HR 79 | Ht 65.0 in | Wt 265.0 lb

## 2022-03-26 DIAGNOSIS — G4733 Obstructive sleep apnea (adult) (pediatric): Secondary | ICD-10-CM | POA: Diagnosis not present

## 2022-03-26 DIAGNOSIS — G20 Parkinson's disease: Secondary | ICD-10-CM

## 2022-03-26 DIAGNOSIS — Z9989 Dependence on other enabling machines and devices: Secondary | ICD-10-CM

## 2022-03-26 MED ORDER — ROPINIROLE HCL 2 MG PO TABS: 3.0000 mg | ORAL_TABLET | Freq: Three times a day (TID) | ORAL | 3 refills | Status: DC

## 2022-03-26 MED ORDER — BUSPIRONE HCL 5 MG PO TABS: 5.0000 mg | ORAL_TABLET | Freq: Four times a day (QID) | ORAL | 3 refills | Status: DC | PRN

## 2022-03-26 NOTE — Patient Instructions (Signed)
Your Plan:  Change pressure 7-15 and ramp pressure to 7 Increase requip 3 mg three times day Buspar 5 mg up to four times a day If your symptoms worsen or you develop new symptoms please let us know.    Thank you for coming to see [place] at [facility]. I hope we have been able to provide you high quality care today.  You may receive a patient satisfaction survey over the next few weeks. We would appreciate your feedback and comments so that we may continue to improve ourselves and the health of our patients.

## 2022-03-26 NOTE — Progress Notes (Signed)
PATIENT: Jonathan Yu DOB: 16-Jun-1951  REASON FOR VISIT: follow up HISTORY FROM: patient PRIMARY NEUROLOGIST: Jonathan Yu  Chief Complaint  Patient presents with   Follow-up    Rm 4, alone.  OSA/CPAP. SD card used for DL.  Asking about increasing ropinirole to qid may send to Jonathan Yu if you do not address.        HISTORY OF PRESENT ILLNESS: Today 03/26/22:  Jonathan Yu is a 71 year old male with a history of OSA on CPAP and Parkinson's disease. He returns today for follow-up.   OSA on CPAP: CPAP report below. Doesn't feel like he is getting enough air when he puts the machine on.   Parkinson's disease: Currently taking Requip 2 mg TID. Reports that he is having more trouble with getting his feet to work. States that when he stands he has trouble getting started. Has tried taking an extra requip and felt that it was helpful.   Feels that his tremor in the afternoon is worse.  He states that it is not every afternoon but seems to occur more in the afternoon hours.  Feels that its more related to his anxiety. Would like to take Buspar QID PRN. States that he has taken an extra tablet before and it has helped.      REVIEW OF SYSTEMS: Out of a complete 14 system review of symptoms, the patient complains only of the following symptoms, and all other reviewed systems are negative.  ALLERGIES: Allergies  Allergen Reactions   Sesame Oil Hives and Shortness Of Breath   Clonazepam Hives   Tape Other (See Comments)    White surgical tape caused some blisters.   Percocet [Oxycodone-Acetaminophen] Rash   Soy Allergy Hives   Topamax [Topiramate]     Cognitive decline   Vicodin [Hydrocodone-Acetaminophen] Rash    HOME MEDICATIONS: Outpatient Medications Prior to Visit  Medication Sig Dispense Refill   albuterol (PROVENTIL HFA;VENTOLIN HFA) 108 (90 Base) MCG/ACT inhaler Inhale 2 puffs into the lungs every 6 (six) hours as needed for wheezing or shortness of breath. 1 Inhaler 0    AMBULATORY NON FORMULARY MEDICATION Continue CPAP at current settings.  Please provide new supplies. Jonathan Yu phone number [phone] 1 each 0   AMBULATORY NON FORMULARY MEDICATION Compression stockings.  Vive size LGM Leg swelling. Disp 1 pair 99 refil. 1 each 99   anastrozole (ARIMIDEX) 1 MG tablet TAKE 1 TABLET EVERY DAY 90 tablet 1   atorvastatin (LIPITOR) 40 MG tablet TAKE 1 TABLET EVERY DAY 90 tablet 0   Azelaic Acid 15 % cream Apply 1 application topically 2 (two) times daily. (Patient taking differently: Apply 1 application  topically 2 (two) times daily as needed.) 50 g 12   busPIRone (BUSPAR) 5 MG tablet TAKE 1 TABLET THREE TIMES DAILY AS NEEDED 270 tablet 1   docusate sodium (COLACE) 50 MG capsule Take 50 mg by mouth daily as needed.     fluticasone (FLONASE) 50 MCG/ACT nasal spray Place into both nostrils daily.     furosemide (LASIX) 20 MG tablet TAKE 1 TABLET BY MOUTH ONCE DAILY AS NEEDED FOR EDEMA 30 tablet 0   loratadine (CLARITIN) 10 MG tablet Take 10 mg by mouth daily.     losartan (COZAAR) 50 MG tablet TAKE 1 TABLET EVERY DAY 90 tablet 3   metFORMIN (GLUCOPHAGE-XR) 500 MG 24 hr tablet Take 1 tablet by mouth once daily with breakfast 90 tablet 0   Misc. Devices (FLEX THERAPY) MISC by  Does not apply route.     Multiple Vitamin (MULTIVITAMIN WITH MINERALS) TABS tablet Take 1 tablet by mouth daily.     Probiotic Product (PROBIOTIC PO) Take by mouth daily.     rOPINIRole (REQUIP) 2 MG tablet Take 1 tablet (2 mg total) by mouth 3 (three) times daily. 270 tablet 3   tadalafil (CIALIS) 20 MG tablet TAKE 1/2 -1 TABLET BY MOUTH EVERY OTHER DAY FOR ERECTILE DYSFUNCTION 30 tablet 2   tamsulosin (FLOMAX) 0.4 MG CAPS capsule TAKE 1 CAPSULE BY MOUTH DAILY AFTER BREAKFAST. 90 capsule 3   VITAMIN D PO Take by mouth.     Facility-Administered Medications Prior to Visit  Medication Dose Route Frequency Provider Last Rate Last Admin   testosterone cypionate (DEPOTESTOSTERONE  CYPIONATE) injection 200 mg  200 mg Intramuscular Q21 days Jonathan Nutting, DO   200 mg at 03/20/22 1224    PAST MEDICAL HISTORY: Past Medical History:  Diagnosis Date   Allergy    DDD (degenerative disc disease), lumbar    Hyperlipidemia    "no meds since losing 106# 2 yr ago" (10/06/2013)   Hypertension 08/12/2010   "no meds since losing 106# 2 yr ago" (10/06/2013)   OSA on CPAP    "don't wear mask much since losing 106# 2 yr ago" (10/06/2013)   Parkinson's disease ([place])    Type II diabetes mellitus ([place])    "no meds since losing 106# 2 yr ago" (10/06/2013)   Walking pneumonia 08/12/1966    PAST SURGICAL HISTORY: Past Surgical History:  Procedure Laterality Date   ANKLE FRACTURE SURGERY Left 1973   ANKLE RECONSTRUCTION Right 05/2011   CARPAL TUNNEL RELEASE Bilateral 2001   HARDWARE REMOVAL Right 10/07/2013   Procedure: RIGHT ANKLE HARDWARE REMOVAL;  Surgeon: Jonathan Minion, MD;  Location: [facility];  Service: Orthopedics;  Laterality: Right;   I & D EXTREMITY Right 11/18/2013   Procedure: IRRIGATION AND DEBRIDEMENT EXTREMITY;  Surgeon: Jonathan Minion, MD;  Location: [facility];  Service: Orthopedics;  Laterality: Right;  Irrigation and Debridement Right Fibula , Place Antibiotic Beads and VAC   NASAL SEPTUM SURGERY  1982   ORIF ANKLE FRACTURE Right 09/08/2013   Procedure: REPAIR SYNDESMOSIS DISRUPTION RIGHT ANKLE;  Surgeon: Jonathan Minion, MD;  Location: [facility];  Service: Orthopedics;  Laterality: Right;   SHOULDER ARTHROSCOPY W/ ROTATOR CUFF REPAIR Right 2006; 2009   TONSILLECTOMY  1959   TOTAL ANKLE ARTHROPLASTY Right 08/18/2013   Procedure: TOTAL ANKLE ARTHOPLASTY;  Surgeon: Jonathan Minion, MD;  Location: [facility];  Service: Orthopedics;  Laterality: Right;  Right Total Ankle Arthroplasty, Revision Fibular Fracture   URETERAL STENT PLACEMENT  ~ 2010 X 2    FAMILY HISTORY: Family History  Problem Relation Age of Onset   Cancer Mother 42       Lung   Alzheimer's disease Father    Parkinson's  disease Father    Scoliosis Sister    Cleft lip Sister     SOCIAL HISTORY: Social History   Socioeconomic History   Marital status: Divorced    Spouse name: Not on file   Number of children: 6   Years of education: 14   Highest education level: Some college, no degree  Occupational History   Occupation: sales/ DJ    Comment: part time  Tobacco Use   Smoking status: Some Days    Types: Pipe, Cigars   Smokeless tobacco: Never   Tobacco comments:    cigar 1-2 times/[place]  Vaping  Use   Vaping Use: Never used  Substance and Sexual Activity   Alcohol use: Yes    Comment: 1-2 drinks per year.   Drug use: No   Sexual activity: Yes  Other Topics Concern   Not on file  Social History Narrative   Lives with his son and his daughter visits often. He does Lobbyist, enjoys singing, music and yard work (when he can).    Social Determinants of Health   Financial Resource Strain: Low Risk  (04/23/2021)   Overall Financial Resource Strain (CARDIA)    Difficulty of Paying Living Expenses: Not hard at all  Food Insecurity: No Food Insecurity (04/23/2021)   Hunger Vital Sign    Worried About Running Out of Food in the Last Year: Never true    Ran Out of Food in the Last Year: Never true  Transportation Needs: No Transportation Needs (04/23/2021)   PRAPARE - Hydrologist (Medical): No    Lack of Transportation (Non-Medical): No  Physical Activity: Inactive (04/23/2021)   Exercise Vital Sign    Days of Exercise per Week: 0 days    Minutes of Exercise per Session: 0 min  Stress: No Stress Concern Present (04/23/2021)   Echo    Feeling of Stress : Not at all  Social Connections: Moderately Isolated (04/23/2021)   Social Connection and Isolation Panel [NHANES]    Frequency of Communication with Friends and Family: More than three times a week    Frequency of Social Gatherings with Friends and  Family: More than three times a week    Attends Religious Services: More than 4 times per year    Active Member of [employer] or Organizations: No    Attends Archivist Meetings: Never    Marital Status: Divorced  Human resources officer Violence: Not At Risk (04/23/2021)   Humiliation, Afraid, Rape, and Kick questionnaire    Fear of Current or Ex-Partner: No    Emotionally Abused: No    Physically Abused: No    Sexually Abused: No      PHYSICAL EXAM  Vitals:   03/26/22 1013  BP: 137/77  Pulse: 79  Weight: 265 lb (120.2 kg)  Height: '5\' 5"'$  (1.651 m)   Body mass index is 44.1 kg/m.  Generalized: Well developed, in no acute distress  Chest: Lungs clear to auscultation bilaterally  Neurological examination  Mentation: Alert oriented to time, place, history taking. Follows all commands speech and language fluent Cranial nerve II-XII: Extraocular movements were full, visual field were full on confrontational test Head turning and shoulder shrug  were normal and symmetric. Motor: The motor testing reveals 5 over 5 strength of all 4 extremities. Good symmetric motor tone is noted throughout.  Finger taps mild impairment.  Toe taps moderate impairment. Sensory: Sensory testing is intact to soft touch on all 4 extremities. No evidence of extinction is noted.  Gait and station: Uses the chair to stand up.  Has a cane when ambulating.   DIAGNOSTIC DATA (LABS, IMAGING, TESTING) - I reviewed patient records, labs, notes, testing and imaging myself where available.  Lab Results  Component Value Date   WBC 5.3 10/04/2021   HGB 16.2 10/04/2021   HCT 48.8 10/04/2021   MCV 88.1 10/04/2021   PLT 200 10/04/2021      Component Value Date/Time   NA 142 10/25/2021 0000   K 4.7 10/25/2021 0000   CL 104 10/25/2021 0000  CO2 28 10/25/2021 0000   GLUCOSE 137 (H) 10/25/2021 0000   BUN 17 10/25/2021 0000   CREATININE 1.14 10/25/2021 0000   CALCIUM 10.0 10/25/2021 0000   PROT 7.1 10/04/2021  0000   ALBUMIN 3.5 (L) 01/07/2017 1149   AST 23 10/04/2021 0000   ALT 38 10/04/2021 0000   ALKPHOS 39 (L) 01/07/2017 1149   BILITOT 1.9 (H) 10/04/2021 0000   GFRNONAA 75 03/16/2020 1038   GFRAA 87 03/16/2020 1038   Lab Results  Component Value Date   CHOL 131 10/04/2021   HDL 38 (L) 10/04/2021   LDLCALC 75 10/04/2021   TRIG 93 10/04/2021   CHOLHDL 3.4 10/04/2021   Lab Results  Component Value Date   HGBA1C 7.1 (A) 01/16/2022   Lab Results  Component Value Date   VITAMINB12 407 03/16/2020   Lab Results  Component Value Date   TSH 3.21 03/16/2020      ASSESSMENT AND PLAN 70 y.o. year old male  has a past medical history of Allergy, DDD (degenerative disc disease), lumbar, Hyperlipidemia, Hypertension (08/12/2010), OSA on CPAP, Parkinson's disease ([place]), Type II diabetes mellitus ([place]), and Walking pneumonia (08/12/1966). here with:  OSA on CPAP  - CPAP compliance excellent -Residual AHI slightly elevated change pressure to 7-15 cmH2O and ramp pressure to 7 cm of water - Encourage patient to use CPAP nightly and > 4 hours each night   2.  Parkinson's disease  -Change Requip to 3 mg 3 times a day -Increase BuSpar to 5 mg up to 4 times a day if needed  Advised if symptoms worsen or he develops new symptoms he should let us know.  Follow-up in 6 months or sooner if needed   Ward Givens, MSN, NP-C 03/26/2022, 10:21 AM [facility] [address] [place], [place] [address] [phone]

## 2022-03-27 NOTE — Progress Notes (Signed)
Fax confirmation received for new orders ([facility]) increase pressure 7-15cm, chg ramp pressure to 7, needs new supplies.

## 2022-04-10 ENCOUNTER — Ambulatory Visit: Payer: Medicare HMO | Admitting: Adult Health

## 2022-04-11 ENCOUNTER — Ambulatory Visit (INDEPENDENT_AMBULATORY_CARE_PROVIDER_SITE_OTHER): Payer: Medicare HMO | Admitting: Family Medicine

## 2022-04-11 DIAGNOSIS — E291 Testicular hypofunction: Secondary | ICD-10-CM | POA: Diagnosis not present

## 2022-04-11 NOTE — Progress Notes (Signed)
   Subjective:    Patient ID: Jonathan Yu, male    DOB: October 05, 1950, 71 y.o.   MRN: [id number]  HPI Patient is here for a testosterone injection. Denies chest pain, shortness of breath, headaches and problems with medication or mood changes.    Review of Systems     Objective:   Physical Exam        Assessment & Plan:  Patient tolerated injection well without complications. Patient advised to schedule next injection in 21 days.

## 2022-04-11 NOTE — Progress Notes (Signed)
Medical screening examination/treatment was performed by qualified clinical staff member and as supervising physician I was immediately available for consultation/collaboration. I have reviewed documentation and agree with assessment and plan.  Andrell Bergeson, DO  

## 2022-05-02 ENCOUNTER — Ambulatory Visit: Payer: Medicare HMO | Admitting: Sports Medicine

## 2022-05-03 ENCOUNTER — Ambulatory Visit (INDEPENDENT_AMBULATORY_CARE_PROVIDER_SITE_OTHER): Payer: Medicare HMO | Admitting: Family Medicine

## 2022-05-03 DIAGNOSIS — Z0000 Encounter for general adult medical examination without abnormal findings: Secondary | ICD-10-CM | POA: Diagnosis not present

## 2022-05-03 NOTE — Progress Notes (Signed)
MEDICARE ANNUAL WELLNESS VISIT  05/03/2022  Telephone Visit Disclaimer This Medicare AWV was conducted by telephone due to national recommendations for restrictions regarding the COVID-19 Pandemic (e.g. social distancing).  I verified, using two identifiers, that I am speaking with Jonathan Yu or their authorized healthcare agent. I discussed the limitations, risks, security, and privacy concerns of performing an evaluation and management service by telephone and the potential availability of an in-person appointment in the future. The patient expressed understanding and agreed to proceed.  Location of Patient: Home Location of Provider (nurse):  In the office.  Subjective:    Jonathan Yu is a 71 y.o. male patient of Jonathan Nutting, DO who had a Medicare Annual Wellness Visit today via telephone. Jayvien is part-time and lives with their family. he has 6 children. he reports that he is socially active and does interact with friends/family regularly. he is minimally physically active and enjoys [place] and working on cars.  Patient Care Team: Jonathan Nutting, DO as PCP - General (Family Medicine) Clent Jacks, MD as Referring Physician (Urology) Newt Minion, MD as Consulting Physician (Orthopedic Surgery)     05/03/2022   12:56 PM 04/23/2021   10:24 AM 07/31/2019    8:44 AM 10/23/2016   10:10 AM 12/21/2013    1:25 PM 11/21/2013    1:00 AM 10/06/2013    7:04 PM  Advanced Directives  Does Patient Have a Medical Advance Directive? No No No No Patient does not have advance directive;Patient would not like information Patient does not have advance directive Patient does not have advance directive;Patient would not like information  Would patient like information on creating a medical advance directive? No - Patient declined No - Patient declined  Yes (Inpatient - patient requests chaplain consult to create a medical advance directive);Yes (ED - Information included in AVS)      Pre-existing out of facility DNR order (yellow form or pink MOST form)      No     Hospital Utilization Over the Past 12 Months: # of hospitalizations or ER visits: 0 # of surgeries: 0  Review of Systems    Patient reports that his overall health is worse compared to last year.  History obtained from chart review and the patient  Patient Reported Readings (BP, Pulse, CBG, Weight, etc) none  Pain Assessment Pain : No/denies pain     Current Medications & Allergies (verified) Allergies as of 05/03/2022       Reactions   Sesame Oil Hives, Shortness Of Breath   Clonazepam Hives   Tape Other (See Comments)   White surgical tape caused some blisters.   Percocet [oxycodone-acetaminophen] Rash   Soy Allergy Hives   Topamax [topiramate]    Cognitive decline   Vicodin [hydrocodone-acetaminophen] Rash        Medication List        Accurate as of May 03, 2022  1:12 PM. If you have any questions, ask your nurse or doctor.          albuterol 108 (90 Base) MCG/ACT inhaler Commonly known as: VENTOLIN HFA Inhale 2 puffs into the lungs every 6 (six) hours as needed for wheezing or shortness of breath.   AMBULATORY NON FORMULARY MEDICATION Continue CPAP at current settings.  Please provide new supplies. Micheal Likens phone number [phone]   AMBULATORY NON FORMULARY MEDICATION Compression stockings.  Vive size LGM Leg swelling. Disp 1 pair 99 refil.   anastrozole 1 MG tablet Commonly known as: ARIMIDEX TAKE  1 TABLET EVERY DAY   atorvastatin 40 MG tablet Commonly known as: LIPITOR TAKE 1 TABLET EVERY DAY   Azelaic Acid 15 % gel Apply 1 application topically 2 (two) times daily. What changed:  when to take this reasons to take this   busPIRone 5 MG tablet Commonly known as: BUSPAR Take 1 tablet (5 mg total) by mouth 4 (four) times daily as needed.   docusate sodium 50 MG capsule Commonly known as: COLACE Take 50 mg by mouth daily as needed.    Flex Therapy Misc by Does not apply route.   fluticasone 50 MCG/ACT nasal spray Commonly known as: FLONASE Place into both nostrils daily.   furosemide 20 MG tablet Commonly known as: LASIX TAKE 1 TABLET BY MOUTH ONCE DAILY AS NEEDED FOR EDEMA   loratadine 10 MG tablet Commonly known as: CLARITIN Take 10 mg by mouth daily.   losartan 50 MG tablet Commonly known as: COZAAR TAKE 1 TABLET EVERY DAY   metFORMIN 500 MG 24 hr tablet Commonly known as: GLUCOPHAGE-XR Take 1 tablet by mouth once daily with breakfast   multivitamin with minerals Tabs tablet Take 1 tablet by mouth daily.   PROBIOTIC PO Take by mouth daily.   rOPINIRole 2 MG tablet Commonly known as: REQUIP Take 1.5 tablets (3 mg total) by mouth 3 (three) times daily.   tadalafil 20 MG tablet Commonly known as: CIALIS TAKE 1/2 -1 TABLET BY MOUTH EVERY OTHER DAY FOR ERECTILE DYSFUNCTION   tamsulosin 0.4 MG Caps capsule Commonly known as: FLOMAX TAKE 1 CAPSULE BY MOUTH DAILY AFTER BREAKFAST.   VITAMIN D PO Take by mouth.        History (reviewed): Past Medical History:  Diagnosis Date   Allergy    DDD (degenerative disc disease), lumbar    Hyperlipidemia    "no meds since losing 106# 2 yr ago" (10/06/2013)   Hypertension 08/12/2010   "no meds since losing 106# 2 yr ago" (10/06/2013)   OSA on CPAP    "don't wear mask much since losing 106# 2 yr ago" (10/06/2013)   Parkinson's disease ([place])    Type II diabetes mellitus ([place])    "no meds since losing 106# 2 yr ago" (10/06/2013)   Walking pneumonia 08/12/1966   Past Surgical History:  Procedure Laterality Date   ANKLE FRACTURE SURGERY Left 1973   ANKLE RECONSTRUCTION Right 05/2011   CARPAL TUNNEL RELEASE Bilateral 2001   HARDWARE REMOVAL Right 10/07/2013   Procedure: RIGHT ANKLE HARDWARE REMOVAL;  Surgeon: Newt Minion, MD;  Location: [place];  Service: Orthopedics;  Laterality: Right;   I & D EXTREMITY Right 11/18/2013   Procedure: IRRIGATION AND  DEBRIDEMENT EXTREMITY;  Surgeon: Newt Minion, MD;  Location: [place];  Service: Orthopedics;  Laterality: Right;  Irrigation and Debridement Right Fibula , Place Antibiotic Beads and VAC   NASAL SEPTUM SURGERY  1982   ORIF ANKLE FRACTURE Right 09/08/2013   Procedure: REPAIR SYNDESMOSIS DISRUPTION RIGHT ANKLE;  Surgeon: Newt Minion, MD;  Location: [place];  Service: Orthopedics;  Laterality: Right;   SHOULDER ARTHROSCOPY W/ ROTATOR CUFF REPAIR Right 2006; 2009   TONSILLECTOMY  1959   TOTAL ANKLE ARTHROPLASTY Right 08/18/2013   Procedure: TOTAL ANKLE ARTHOPLASTY;  Surgeon: Newt Minion, MD;  Location: [place];  Service: Orthopedics;  Laterality: Right;  Right Total Ankle Arthroplasty, Revision Fibular Fracture   URETERAL STENT PLACEMENT  ~ 2010 X 2   Family History  Problem Relation Age of Onset  Cancer Mother 57       Lung   Alzheimer's disease Father    Parkinson's disease Father    Scoliosis Sister    Cleft lip Sister    Social History   Socioeconomic History   Marital status: Divorced    Spouse name: Not on file   Number of children: 6   Years of education: 14   Highest education level: Some college, no degree  Occupational History   Occupation: sales/ DJ    Comment: part time  Tobacco Use   Smoking status: Some Days    Types: Pipe, Cigars   Smokeless tobacco: Never   Tobacco comments:    cigar 1-2 times/[place]  Vaping Use   Vaping Use: Never used  Substance and Sexual Activity   Alcohol use: Yes    Comment: 1-2 drinks per year.   Drug use: No   Sexual activity: Yes  Other Topics Concern   Not on file  Social History Narrative   Lives with his son and his daughter. He does Lobbyist, enjoys singing, music and yard work (when he can).    Social Determinants of Health   Financial Resource Strain: Low Risk  (05/03/2022)   Overall Financial Resource Strain (CARDIA)    Difficulty of Paying Living Expenses: Not hard at all  Food Insecurity: No Food Insecurity (05/03/2022)    Hunger Vital Sign    Worried About Running Out of Food in the Last Year: Never true    Ran Out of Food in the Last Year: Never true  Transportation Needs: No Transportation Needs (05/03/2022)   PRAPARE - Hydrologist (Medical): No    Lack of Transportation (Non-Medical): No  Physical Activity: Insufficiently Active (05/03/2022)   Exercise Vital Sign    Days of Exercise per Week: 1 day    Minutes of Exercise per Session: 60 min  Stress: No Stress Concern Present (05/03/2022)   [place]    Feeling of Stress : Not at all  Social Connections: Moderately Isolated (05/03/2022)   Social Connection and Isolation Panel [NHANES]    Frequency of Communication with Friends and Family: More than three times a week    Frequency of Social Gatherings with Friends and Family: More than three times a week    Attends Religious Services: More than 4 times per year    Active Member of [employer] or Organizations: No    Attends Archivist Meetings: Never    Marital Status: Divorced    Activities of Daily Living    05/03/2022   12:57 PM  In your present state of health, do you have any difficulty performing the following activities:  Hearing? 1  Comment some hearing loss  Vision? 0  Difficulty concentrating or making decisions? 0  Walking or climbing stairs? 0  Dressing or bathing? 0  Doing errands, shopping? 0  Preparing Food and eating ? N  Using the Toilet? N  In the past six months, have you accidently leaked urine? Y  Comment currently on flomax, urine urgency  Do you have problems with loss of bowel control? N  Managing your Medications? N  Managing your Finances? N  Housekeeping or managing your Housekeeping? N    Patient Education/ Literacy How often do you need to have someone help you when you read instructions, pamphlets, or other written materials from your doctor or pharmacy?: 1 -  Never  Exercise Current Exercise Habits:  Home exercise routine, Type of exercise: stretching, Time (Minutes): 15, Frequency (Times/Week): 4, Weekly Exercise (Minutes/Week): 60, Intensity: Mild, Exercise limited by: None identified  Diet Patient reports consuming 3 meals a day and 2 snack(s) a day Patient reports that his primary diet is: Regular Patient reports that she does have regular access to food.   Depression Screen    05/03/2022    1:02 PM 10/04/2021    9:40 AM 04/23/2021   10:25 AM 01/29/2021    2:23 PM 08/02/2019    4:12 PM 06/03/2019   10:47 AM 04/16/2017    2:53 PM  PHQ 2/9 Scores  PHQ - 2 Score 0 1 0 0 6 0 0  PHQ- 9 Score     27 2      Fall Risk    05/03/2022    1:02 PM 10/04/2021    9:40 AM 04/23/2021   10:24 AM 01/29/2021    2:23 PM 10/14/2019   10:28 AM  Fall Risk   Falls in the past year? 1 1 0 0 0  Number falls in past yr: 1 1 0 0 0  Injury with Fall? 0 0 0 0 0  Risk for fall due to : History of fall(s);Impaired mobility;Impaired balance/gait History of fall(s);Impaired balance/gait No Fall Risks Impaired balance/gait   Follow up Falls evaluation completed;Education provided;Falls prevention discussed Falls evaluation completed Falls evaluation completed Falls evaluation completed Falls evaluation completed     Objective:  Berish Bohman seemed alert and oriented and he participated appropriately during our telephone visit.  Blood Pressure Weight BMI  BP Readings from Last 3 Encounters:  04/11/22 (!) 145/86  03/26/22 137/77  03/20/22 98/64   Wt Readings from Last 3 Encounters:  04/11/22 255 lb 9.6 oz (115.9 kg)  03/26/22 265 lb (120.2 kg)  03/20/22 256 lb (116.1 kg)   BMI Readings from Last 1 Encounters:  04/11/22 42.53 kg/m    *Unable to obtain current vital signs, weight, and BMI due to telephone visit type  Hearing/Vision  Harvey did not seem to have difficulty with hearing/understanding during the telephone conversation Reports that he has had  a formal eye exam by an eye care professional within the past year Reports that he has not had a formal hearing evaluation within the past year *Unable to fully assess hearing and vision during telephone visit type  Cognitive Function:    05/03/2022    1:07 PM 04/23/2021   10:35 AM  6CIT Screen  What Year? 0 points 0 points  What month? 0 points 0 points  What time? 0 points 0 points  Count back from 20 0 points 0 points  Months in reverse 0 points 0 points  Repeat phrase 2 points 0 points  Total Score 2 points 0 points   (Normal:0-7, Significant for Dysfunction: >8)  Normal Cognitive Function Screening: Yes   Immunization & Health Maintenance Record Immunization History  Administered Date(s) Administered   Fluad Quad(high Dose 65+) 04/13/2020, 05/31/2021   Influenza Split 07/16/2012   Influenza Whole 04/25/2011   Influenza,inj,Quad PF,6+ Mos 03/23/2014, 06/10/2014, 09/29/2015, 03/28/2016, 04/16/2017, 04/30/2018, 04/22/2019   Influenza-Unspecified 05/12/2013   PFIZER(Purple Top)SARS-COV-2 Vaccination 10/15/2019, 11/10/2019   Pneumococcal Conjugate-13 06/16/2009   Pneumococcal Polysaccharide-23 08/14/2018   Tdap 04/25/2011, 05/10/2021   Zoster Recombinat (Shingrix) 05/10/2021, 03/11/2022   Zoster, Live 10/11/2014    Health Maintenance  Topic Date Due   Diabetic kidney evaluation - Urine ACR  05/04/2022 (Originally 07/16/2013)   FOOT EXAM  05/04/2022 (Originally 07/16/2013)  INFLUENZA VACCINE  11/10/2022 (Originally 03/12/2022)   COVID-19 Vaccine (3 - Pfizer risk series) 11/16/2022 (Originally 12/08/2019)   HEMOGLOBIN A1C  07/18/2022   Diabetic kidney evaluation - GFR measurement  10/26/2022   OPHTHALMOLOGY EXAM  01/11/2023   COLONOSCOPY (Pts 45-9yr Insurance coverage will need to be confirmed)  06/03/2029   TETANUS/TDAP  05/11/2031   Pneumonia Vaccine 71 Years old  Completed   Hepatitis C Screening  Completed   Zoster Vaccines- Shingrix  Completed   HPV VACCINES  Aged  Out       Assessment  This is a routine wellness examination for [employer]  Health Maintenance: Due or Overdue There are no preventive care reminders to display for this patient.   NRosalin Hawkingdoes not need a referral for CCommercial Metals CompanyAssistance: Care Management:   no Social Work:    no Prescription Assistance:  no Nutrition/Diabetes Education:  no   Plan:  Personalized Goals  Goals Addressed               This Visit's Progress     Patient Stated (pt-stated)        Patient would like to get his weight to 200lbs       Personalized Health Maintenance & Screening Recommendations  Influenza vaccine Urine ACR Foot exam  Lung Cancer Screening Recommended: no (Low Dose CT Chest recommended if Age 69-80years, 30 pack-year currently smoking OR have quit w/in past 15 years) Hepatitis C Screening recommended: no HIV Screening recommended: no  Advanced Directives: Written information was not prepared per patient's request.  Referrals & Orders No orders of the defined types were placed in this encounter.   Follow-up Plan Follow-up with MLuetta Nutting DO as planned Medicare wellness visit in one year.  Patient will access AVS on my chart.   I have personally reviewed and noted the following in the patient's chart:   Medical and social history Use of alcohol, tobacco or illicit drugs  Current medications and supplements Functional ability and status Nutritional status Physical activity Advanced directives List of other physicians Hospitalizations, surgeries, and ER visits in previous 12 months Vitals Screenings to include cognitive, depression, and falls Referrals and appointments  In addition, I have reviewed and discussed with NRosalin Hawkingcertain preventive protocols, quality metrics, and best practice recommendations. A written personalized care plan for preventive services as well as general preventive health recommendations is available and can  be mailed to the patient at his request.      BTinnie Gens RN BSN  05/03/2022

## 2022-05-03 NOTE — Patient Instructions (Addendum)
[facility] Maintenance Summary and Written Plan of Care  Mr. Jonathan Yu ,  Thank you for allowing me to perform your Medicare Annual Wellness Visit and for your ongoing commitment to your health.   Health Maintenance & Immunization History Health Maintenance  Topic Date Due  . Diabetic kidney evaluation - Urine ACR  05/04/2022 (Originally 07/16/2013)  . FOOT EXAM  05/04/2022 (Originally 07/16/2013)  . INFLUENZA VACCINE  11/10/2022 (Originally 03/12/2022)  . COVID-19 Vaccine (3 - Pfizer risk series) 11/16/2022 (Originally 12/08/2019)  . HEMOGLOBIN A1C  07/18/2022  . Diabetic kidney evaluation - GFR measurement  10/26/2022  . OPHTHALMOLOGY EXAM  01/11/2023  . COLONOSCOPY (Pts 45-39yr Insurance coverage will need to be confirmed)  06/03/2029  . TETANUS/TDAP  05/11/2031  . Pneumonia Vaccine 71 Years old  Completed  . Hepatitis C Screening  Completed  . Zoster Vaccines- Shingrix  Completed  . HPV VACCINES  Aged Out   Immunization History  Administered Date(s) Administered  . Fluad Quad(high Dose 65+) 04/13/2020, 05/31/2021  . Influenza Split 07/16/2012  . Influenza Whole 04/25/2011  . Influenza,inj,Quad PF,6+ Mos 03/23/2014, 06/10/2014, 09/29/2015, 03/28/2016, 04/16/2017, 04/30/2018, 04/22/2019  . Influenza-Unspecified 05/12/2013  . PFIZER(Purple Top)SARS-COV-2 Vaccination 10/15/2019, 11/10/2019  . Pneumococcal Conjugate-13 06/16/2009  . Pneumococcal Polysaccharide-23 08/14/2018  . Tdap 04/25/2011, 05/10/2021  . Zoster Recombinat (Shingrix) 05/10/2021, 03/11/2022  . Zoster, Live 10/11/2014    These are the patient goals that we discussed:  Goals Addressed              This Visit's Progress   .  Patient Stated (pt-stated)        Patient would like to get his weight to 200lbs        This is a list of Health Maintenance Items that are overdue or due now: Influenza vaccine Urine ACR Foot exam  Orders/Referrals Placed Today: No orders of the  defined types were placed in this encounter.  (Contact our referral department at [phone] you have not spoken with someone about your referral appointment within the next 5 days)    Follow-up Plan Follow-up with MLuetta Nutting DO as planned Medicare wellness visit in one year.  Patient will access AVS on my chart.      Health Maintenance, Male Adopting a healthy lifestyle and getting preventive care are important in promoting health and wellness. Ask your health care provider about: The right schedule for you to have regular tests and exams. Things you can do on your own to prevent diseases and keep yourself healthy. What should I know about diet, weight, and exercise? Eat a healthy diet  Eat a diet that includes plenty of vegetables, fruits, low-fat dairy products, and lean protein. Do not eat a lot of foods that are high in solid fats, added sugars, or sodium. Maintain a healthy weight Body mass index (BMI) is a measurement that can be used to identify possible weight problems. It estimates body fat based on height and weight. Your health care provider can help determine your BMI and help you achieve or maintain a healthy weight. Get regular exercise Get regular exercise. This is one of the most important things you can do for your health. Most adults should: Exercise for at least 150 minutes each week. The exercise should increase your heart rate and make you sweat (moderate-intensity exercise). Do strengthening exercises at least twice a week. This is in addition to the moderate-intensity exercise. Spend less time sitting. Even light physical activity can be beneficial.  Watch cholesterol and blood lipids Have your blood tested for lipids and cholesterol at 69 years of age, then have this test every 5 years. You may need to have your cholesterol levels checked more often if: Your lipid or cholesterol levels are high. You are older than 69 years of age. You are at high  risk for heart disease. What should I know about cancer screening? Many types of cancers can be detected early and may often be prevented. Depending on your health history and family history, you may need to have cancer screening at various ages. This may include screening for: Colorectal cancer. Prostate cancer. Skin cancer. Lung cancer. What should I know about heart disease, diabetes, and high blood pressure? Blood pressure and heart disease High blood pressure causes heart disease and increases the risk of stroke. This is more likely to develop in people who have high blood pressure readings or are overweight. Talk with your health care provider about your target blood pressure readings. Have your blood pressure checked: Every 3-5 years if you are 77-51 years of age. Every year if you are 32 years old or older. If you are between the ages of 20 and 6 and are a current or former smoker, ask your health care provider if you should have a one-time screening for abdominal aortic aneurysm (AAA). Diabetes Have regular diabetes screenings. This checks your fasting blood sugar level. Have the screening done: Once every three years after age 7 if you are at a normal weight and have a low risk for diabetes. More often and at a younger age if you are overweight or have a high risk for diabetes. What should I know about preventing infection? Hepatitis B If you have a higher risk for hepatitis B, you should be screened for this virus. Talk with your health care provider to find out if you are at risk for hepatitis B infection. Hepatitis C Blood testing is recommended for: Everyone born from [birth year] through 1963. Anyone with known risk factors for hepatitis C. Sexually transmitted infections (STIs) You should be screened each year for STIs, including gonorrhea and chlamydia, if: You are sexually active and are younger than 69 years of age. You are older than 69 years of age and your health care  provider tells you that you are at risk for this type of infection. Your sexual activity has changed since you were last screened, and you are at increased risk for chlamydia or gonorrhea. Ask your health care provider if you are at risk. Ask your health care provider about whether you are at high risk for HIV. Your health care provider may recommend a prescription medicine to help prevent HIV infection. If you choose to take medicine to prevent HIV, you should first get tested for HIV. You should then be tested every 3 months for as long as you are taking the medicine. Follow these instructions at home: Alcohol use Do not drink alcohol if your health care provider tells you not to drink. If you drink alcohol: Limit how much you have to 0-2 drinks a day. Know how much alcohol is in your drink. In the [place] one drink equals one 12 oz bottle of beer (355 mL), one 5 oz glass of wine (148 mL), or one 1 oz glass of hard liquor (44 mL). Lifestyle Do not use any products that contain nicotine or tobacco. These products include cigarettes, chewing tobacco, and vaping devices, such as e-cigarettes. If you need help quitting, ask your health  care provider. Do not use street drugs. Do not share needles. Ask your health care provider for help if you need support or information about quitting drugs. General instructions Schedule regular health, dental, and eye exams. Stay current with your vaccines. Tell your health care provider if: You often feel depressed. You have ever been abused or do not feel safe at home. Summary Adopting a healthy lifestyle and getting preventive care are important in promoting health and wellness. Follow your health care provider's instructions about healthy diet, exercising, and getting tested or screened for diseases. Follow your health care provider's instructions on monitoring your cholesterol and blood pressure. This information is not intended to replace advice given to  you by your health care provider. Make sure you discuss any questions you have with your health care provider. Document Revised: 12/18/2020 Document Reviewed: 12/18/2020 Elsevier Patient Education  [employer].

## 2022-05-07 ENCOUNTER — Ambulatory Visit (INDEPENDENT_AMBULATORY_CARE_PROVIDER_SITE_OTHER): Payer: Medicare HMO | Admitting: Sports Medicine

## 2022-05-07 DIAGNOSIS — Z23 Encounter for immunization: Secondary | ICD-10-CM | POA: Diagnosis not present

## 2022-05-07 DIAGNOSIS — Z96661 Presence of right artificial ankle joint: Secondary | ICD-10-CM | POA: Diagnosis not present

## 2022-05-07 DIAGNOSIS — E291 Testicular hypofunction: Secondary | ICD-10-CM

## 2022-05-07 MED ORDER — TRAMADOL HCL 50 MG PO TABS: 50.0000 mg | ORAL_TABLET | Freq: Three times a day (TID) | ORAL | 0 refills | Status: DC | PRN

## 2022-05-07 MED ORDER — TESTOSTERONE CYPIONATE 200 MG/ML IM SOLN
200.0000 mg | Freq: Once | INTRAMUSCULAR | Status: AC
  Administered 2022-05-07: 200 mg via INTRAMUSCULAR

## 2022-05-07 NOTE — Assessment & Plan Note (Signed)
Returns, he is a very pleasant 71 year old male with history of Parkinson's disease and multifactorial bilateral ankle pain, he is post right total ankle arthroplasty, initially he had some pain around the joint, I injected his joint with strict sterile technique, and he returned pain-free. On the left side he has now had injections in the tibialis posterior sheath and ankle joint, and is doing relatively well. He has a bit of pain at the arch, not so much at the plantar fascial origin, I added scaphoid pads to his sandals. I have also recommended a lace up brace that he can get over-the-counter. We will add some tramadol for use as well, and he can return to see me in 4 to 6 weeks for this.

## 2022-05-07 NOTE — Assessment & Plan Note (Signed)
Testosterone given intramuscular, flu vaccine today.

## 2022-05-07 NOTE — Addendum Note (Signed)
Addended by: Burton Apley A on: 05/07/2022 05:26 PM   Modules accepted: Orders

## 2022-05-07 NOTE — Progress Notes (Signed)
    Procedures performed today:    None.  Independent interpretation of notes and tests performed by another provider:   None.  Brief History, Exam, Impression, and Recommendations:    Chronic bilateral ankle plain, right total ankle arthroplasty, left ankle posttraumatic osteoarthritis Returns, he is a very pleasant 72 year old male with history of Parkinson's disease and multifactorial bilateral ankle pain, he is post right total ankle arthroplasty, initially he had some pain around the joint, I injected his joint with strict sterile technique, and he returned pain-free. On the left side he has now had injections in the tibialis posterior sheath and ankle joint, and is doing relatively well. He has a bit of pain at the arch, not so much at the plantar fascial origin, I added scaphoid pads to his sandals. I have also recommended a lace up brace that he can get over-the-counter. We will add some tramadol for use as well, and he can return to see me in 4 to 6 weeks for this.  Hypogonadism in male Testosterone given intramuscular, flu vaccine today.    ____________________________________________ Gwen Her. Dianah Field, M.D., ABFM., CAQSM., AME. Primary Care and Sports Medicine [facility]  Adjunct Professor of [employer] [facility] [employer]  Risk manager

## 2022-05-08 ENCOUNTER — Telehealth: Payer: Self-pay

## 2022-05-08 NOTE — Telephone Encounter (Signed)
Curt Bears @ [facility] called to inquire about patient's tramadol rx. They have never filled for patient and she wants to know if this is an acute or chronic?

## 2022-05-08 NOTE — Telephone Encounter (Signed)
Chronic most likely

## 2022-05-10 NOTE — Telephone Encounter (Signed)
Spoke with Candace at [facility] to give her the below information.

## 2022-05-30 ENCOUNTER — Ambulatory Visit (INDEPENDENT_AMBULATORY_CARE_PROVIDER_SITE_OTHER): Payer: Medicare HMO | Admitting: Family Medicine

## 2022-05-30 VITALS — BP 127/74 | HR 76

## 2022-05-30 DIAGNOSIS — E291 Testicular hypofunction: Secondary | ICD-10-CM

## 2022-05-30 NOTE — Progress Notes (Signed)
Medical screening examination/treatment was performed by qualified clinical staff member and as supervising physician I was immediately available for consultation/collaboration. I have reviewed documentation and agree with assessment and plan.  Jaquelyne Firkus, DO  

## 2022-05-30 NOTE — Progress Notes (Signed)
Patient is here for testosterone injection. Denies chest pain, shortness of breath, headaches, and problems with medication or mood changes.   Patient tolerated testosterone 200 mg injection to [facility] well without complications. Patient advised to schedule next injection in 21 days.   Patient also states he had a cold 3 weeks. He was not evaluated by a provider for it. He states symptoms are gone except a chronic cough at night. He would like a prescription for Tessalon if Dr. Zigmund Daniel is willing. I did encourage him to make an appointment for evaluation, but advised I would send the message as requested.

## 2022-05-31 ENCOUNTER — Other Ambulatory Visit: Payer: Self-pay | Admitting: Sports Medicine

## 2022-05-31 ENCOUNTER — Other Ambulatory Visit: Payer: Self-pay | Admitting: Family Medicine

## 2022-05-31 DIAGNOSIS — Z96661 Presence of right artificial ankle joint: Secondary | ICD-10-CM

## 2022-06-18 ENCOUNTER — Other Ambulatory Visit: Payer: Self-pay | Admitting: Family Medicine

## 2022-06-18 DIAGNOSIS — N400 Enlarged prostate without lower urinary tract symptoms: Secondary | ICD-10-CM

## 2022-06-19 ENCOUNTER — Ambulatory Visit (INDEPENDENT_AMBULATORY_CARE_PROVIDER_SITE_OTHER): Payer: Medicare HMO | Admitting: Family Medicine

## 2022-06-19 VITALS — BP 137/79 | HR 69 | Temp 97.5°F | Ht 65.0 in

## 2022-06-19 DIAGNOSIS — E291 Testicular hypofunction: Secondary | ICD-10-CM

## 2022-06-19 NOTE — Progress Notes (Signed)
   Established Patient Office Visit  Subjective   Patient ID: Jonathan Yu, male    DOB: Nov 25, 1950  Age: 71 y.o. MRN: [id number]  Chief Complaint  Patient presents with   testosterone injection    Nurse visit for testosterone injection    HPI  Testosterone injection- patient denies chest pain, shortness of breath,  mood changes or problems with medication.   ROS    Objective:     BP 137/79   Pulse 69   Temp (!) 97.5 F (36.4 C)   Ht '5\' 5"'$  (1.651 m)   SpO2 93%   BMI 42.53 kg/m    Physical Exam   No results found for any visits on 06/19/22.    The 10-year ASCVD risk score (Arnett DK, et al., 2019) is: 41.8%    Assessment & Plan:  Testosterone injection- patient did mention that he had been having chest congestion, cough that caused a headache and bodyaches for the past 5 weeks.  Patient schld to evaluate these symptoms with Dr. Zigmund Daniel for tomorrow 06/20/22 - patient cleared for testostorone injection by Dr. Zigmund Daniel. Administered testosterone injection- patient tolerated the injection well without complications.  Problem List Items Addressed This Visit       Endocrine   Hypogonadism in male - Primary    Return in about 3 weeks (around 07/10/2022) for testosterone injection nurse visit.    Rae Lips, LPN

## 2022-06-19 NOTE — Progress Notes (Signed)
Medical screening examination/treatment was performed by qualified clinical staff member and as supervising physician I was immediately available for consultation/collaboration. I have reviewed documentation and agree with assessment and plan.  Jaliza Seifried, DO  

## 2022-06-19 NOTE — Patient Instructions (Signed)
Testosterone injection given today- patient also mentioned that he had been having some chest congestion, cough , headache x 5 weeks. Dr. Zigmund Daniel placed patient on schedule for tomorrow 06/20/22 '@1'$ :10 for evaluation of this.

## 2022-06-20 ENCOUNTER — Ambulatory Visit (INDEPENDENT_AMBULATORY_CARE_PROVIDER_SITE_OTHER): Payer: Medicare HMO | Admitting: Family Medicine

## 2022-06-20 ENCOUNTER — Ambulatory Visit: Payer: Medicare HMO

## 2022-06-20 ENCOUNTER — Encounter: Payer: Self-pay | Admitting: Family Medicine

## 2022-06-20 ENCOUNTER — Ambulatory Visit (INDEPENDENT_AMBULATORY_CARE_PROVIDER_SITE_OTHER): Payer: Medicare HMO

## 2022-06-20 VITALS — BP 174/83 | HR 68 | Ht 65.0 in | Wt 262.1 lb

## 2022-06-20 DIAGNOSIS — R051 Acute cough: Secondary | ICD-10-CM

## 2022-06-20 DIAGNOSIS — I10 Essential (primary) hypertension: Secondary | ICD-10-CM | POA: Diagnosis not present

## 2022-06-20 DIAGNOSIS — R0989 Other specified symptoms and signs involving the circulatory and respiratory systems: Secondary | ICD-10-CM

## 2022-06-20 DIAGNOSIS — R059 Cough, unspecified: Secondary | ICD-10-CM | POA: Diagnosis not present

## 2022-06-20 MED ORDER — BENZONATATE 200 MG PO CAPS: 200.0000 mg | ORAL_CAPSULE | Freq: Two times a day (BID) | ORAL | 0 refills | Status: DC | PRN

## 2022-06-20 MED ORDER — DOXYCYCLINE HYCLATE 100 MG PO TABS: 100.0000 mg | ORAL_TABLET | Freq: Two times a day (BID) | ORAL | 0 refills | Status: DC

## 2022-06-20 MED ORDER — PREDNISONE 50 MG PO TABS: ORAL_TABLET | ORAL | 0 refills | Status: DC

## 2022-06-20 NOTE — Progress Notes (Signed)
Jonathan Yu - 71 y.o. male MRN [id number]  Date of birth: [birth year]  Subjective Chief Complaint  Patient presents with   Headache   Cough    HPI Jonathan Yu is 71 y.o. male here today with complaint of cough and chest congestion.  He has had this for about 5 weeks.  He has tried OTC cold medications and tessalon perles with limited relief.  He does have some mild dyspnea at times.  He has noted some wheezing.  Denies sinus pain, chest pain, fever, chills or GI symptoms.  Denies any difficulty swallowing related to his Parkinson's.  No sensation of choking after eating or drinking.  ROS:  A comprehensive ROS was completed and negative except as noted per HPI  Allergies  Allergen Reactions   Sesame Oil Hives and Shortness Of Breath   Clonazepam Hives   Tape Other (See Comments)    White surgical tape caused some blisters.   Percocet [Oxycodone-Acetaminophen] Rash   Soy Allergy Hives   Topamax [Topiramate]     Cognitive decline   Vicodin [Hydrocodone-Acetaminophen] Rash    Past Medical History:  Diagnosis Date   Allergy    DDD (degenerative disc disease), lumbar    Hyperlipidemia    "no meds since losing 106# 2 yr ago" (10/06/2013)   Hypertension 08/12/2010   "no meds since losing 106# 2 yr ago" (10/06/2013)   OSA on CPAP    "don't wear mask much since losing 106# 2 yr ago" (10/06/2013)   Parkinson's disease    Type II diabetes mellitus ([place])    "no meds since losing 106# 2 yr ago" (10/06/2013)   Walking pneumonia 08/12/1966    Past Surgical History:  Procedure Laterality Date   ANKLE FRACTURE SURGERY Left 1973   ANKLE RECONSTRUCTION Right 05/2011   CARPAL TUNNEL RELEASE Bilateral 2001   HARDWARE REMOVAL Right 10/07/2013   Procedure: RIGHT ANKLE HARDWARE REMOVAL;  Surgeon: Newt Minion, MD;  Location: [facility];  Service: Orthopedics;  Laterality: Right;   I & D EXTREMITY Right 11/18/2013   Procedure: IRRIGATION AND DEBRIDEMENT EXTREMITY;  Surgeon: Newt Minion, MD;   Location: [place];  Service: Orthopedics;  Laterality: Right;  Irrigation and Debridement Right Fibula , Place Antibiotic Beads and VAC   NASAL SEPTUM SURGERY  1982   ORIF ANKLE FRACTURE Right 09/08/2013   Procedure: REPAIR SYNDESMOSIS DISRUPTION RIGHT ANKLE;  Surgeon: Newt Minion, MD;  Location: [facility];  Service: Orthopedics;  Laterality: Right;   SHOULDER ARTHROSCOPY W/ ROTATOR CUFF REPAIR Right 2006; 2009   TONSILLECTOMY  1959   TOTAL ANKLE ARTHROPLASTY Right 08/18/2013   Procedure: TOTAL ANKLE ARTHOPLASTY;  Surgeon: Newt Minion, MD;  Location: [facility];  Service: Orthopedics;  Laterality: Right;  Right Total Ankle Arthroplasty, Revision Fibular Fracture   URETERAL STENT PLACEMENT  ~ 2010 X 2    Social History   Socioeconomic History   Marital status: Divorced    Spouse name: Not on file   Number of children: 6   Years of education: 14   Highest education level: Some college, no degree  Occupational History   Occupation: sales/ DJ    Comment: part time  Tobacco Use   Smoking status: Some Days    Types: Pipe, Cigars   Smokeless tobacco: Never   Tobacco comments:    cigar 1-2 times/[place]  Vaping Use   Vaping Use: Never used  Substance and Sexual Activity   Alcohol use: Yes    Comment: 1-2 drinks  per year.   Drug use: No   Sexual activity: Yes  Other Topics Concern   Not on file  Social History Narrative   Lives with his son and his daughter. He does Lobbyist, enjoys singing, music and yard work (when he can).    Social Determinants of Health   Financial Resource Strain: Low Risk  (05/03/2022)   Overall Financial Resource Strain (CARDIA)    Difficulty of Paying Living Expenses: Not hard at all  Food Insecurity: No Food Insecurity (05/03/2022)   Hunger Vital Sign    Worried About Running Out of Food in the Last Year: Never true    Ran Out of Food in the Last Year: Never true  Transportation Needs: No Transportation Needs (05/03/2022)   PRAPARE - Radiographer, therapeutic (Medical): No    Lack of Transportation (Non-Medical): No  Physical Activity: Insufficiently Active (05/03/2022)   Exercise Vital Sign    Days of Exercise per Week: 1 day    Minutes of Exercise per Session: 60 min  Stress: No Stress Concern Present (05/03/2022)   [place]    Feeling of Stress : Not at all  Social Connections: Moderately Isolated (05/03/2022)   Social Connection and Isolation Panel [NHANES]    Frequency of Communication with Friends and Family: More than three times a week    Frequency of Social Gatherings with Friends and Family: More than three times a week    Attends Religious Services: More than 4 times per year    Active Member of [employer] or Organizations: No    Attends Archivist Meetings: Never    Marital Status: Divorced    Family History  Problem Relation Age of Onset   Cancer Mother 13       Lung   Alzheimer's disease Father    Parkinson's disease Father    Scoliosis Sister    Cleft lip Sister     Health Maintenance  Topic Date Due   Diabetic kidney evaluation - Urine ACR  09/12/2022 (Originally 07/16/2013)   FOOT EXAM  09/12/2022 (Originally 07/16/2013)   COVID-19 Vaccine (3 - Pfizer risk series) 11/16/2022 (Originally 12/08/2019)   HEMOGLOBIN A1C  07/18/2022   Diabetic kidney evaluation - GFR measurement  10/26/2022   OPHTHALMOLOGY EXAM  01/11/2023   Medicare Annual Wellness (AWV)  05/04/2023   COLONOSCOPY (Pts 45-57yr Insurance coverage will need to be confirmed)  06/03/2029   TETANUS/TDAP  05/11/2031   Pneumonia Vaccine 71 Years old  Completed   INFLUENZA VACCINE  Completed   Hepatitis C Screening  Completed   Zoster Vaccines- Shingrix  Completed   HPV VACCINES  Aged Out      ----------------------------------------------------------------------------------------------------------------------------------------------------------------------------------------------------------------- Physical Exam BP (!) 174/83 (BP Location: Left Arm, Patient Position: Sitting, Cuff Size: Large)   Pulse 68   Ht '5\' 5"'$  (1.651 m)   Wt 262 lb 1.6 oz (118.9 kg)   SpO2 92%   BMI 43.62 kg/m   Physical Exam Constitutional:      Appearance: He is well-developed.  HENT:     Head: Normocephalic and atraumatic.  Eyes:     General: No scleral icterus. Cardiovascular:     Rate and Rhythm: Normal rate and regular rhythm.  Pulmonary:     Effort: Pulmonary effort is normal.     Breath sounds: Normal breath sounds.  Musculoskeletal:     Cervical back: Neck supple.  Neurological:     Mental Status:  He is alert.  Psychiatric:        Mood and Affect: Mood normal.        Behavior: Behavior normal.     ------------------------------------------------------------------------------------------------------------------------------------------------------------------------------------------------------------------- Assessment and Plan  Essential hypertension, benign Blood pressure elevated today.  Recommend avoidance of any medications containing decongestants.  Recheck blood pressure at next visit for testosterone injection.  Acute cough He does have some slightly decreased breath sounds in the left lower lobe.  Chest x-ray ordered.  He has noted some wheezing.  Adding burst of prednisone with doxycycline.  Tessalon Perles as needed.  Supportive measures including humidifier at home recommended.   Meds ordered this encounter  Medications   doxycycline (VIBRA-TABS) 100 MG tablet    Sig: Take 1 tablet (100 mg total) by mouth 2 (two) times daily.    Dispense:  20 tablet    Refill:  0   predniSONE (DELTASONE) 50 MG tablet    Sig: Take '50mg'$  daily x5 days.    Dispense:  5 tablet     Refill:  0   benzonatate (TESSALON) 200 MG capsule    Sig: Take 1 capsule (200 mg total) by mouth 2 (two) times daily as needed for cough.    Dispense:  20 capsule    Refill:  0    No follow-ups on file.    This visit occurred during the SARS-CoV-2 public health emergency.  Safety protocols were in place, including screening questions prior to the visit, additional usage of staff PPE, and extensive cleaning of exam room while observing appropriate contact time as indicated for disinfecting solutions.

## 2022-06-23 DIAGNOSIS — R051 Acute cough: Secondary | ICD-10-CM | POA: Insufficient documentation

## 2022-06-23 NOTE — Assessment & Plan Note (Signed)
He does have some slightly decreased breath sounds in the left lower lobe.  Chest x-ray ordered.  He has noted some wheezing.  Adding burst of prednisone with doxycycline.  Tessalon Perles as needed.  Supportive measures including humidifier at home recommended.

## 2022-06-23 NOTE — Assessment & Plan Note (Signed)
Blood pressure elevated today.  Recommend avoidance of any medications containing decongestants.  Recheck blood pressure at next visit for testosterone injection.

## 2022-07-11 ENCOUNTER — Other Ambulatory Visit: Payer: Self-pay

## 2022-07-11 ENCOUNTER — Ambulatory Visit (INDEPENDENT_AMBULATORY_CARE_PROVIDER_SITE_OTHER): Payer: Medicare HMO | Admitting: Family Medicine

## 2022-07-11 VITALS — BP 127/80 | HR 77 | Resp 20 | Ht 65.0 in | Wt 254.7 lb

## 2022-07-11 DIAGNOSIS — E291 Testicular hypofunction: Secondary | ICD-10-CM | POA: Diagnosis not present

## 2022-07-11 NOTE — Progress Notes (Signed)
   Subjective:    Patient ID: Jonathan Yu, male    DOB: [birth year], 71 y.o.   MRN: [id number]  HPI  Patient is here for a testosterone injection. Denies chast pain, shortness of breath, headaches and problems with medication or mood changes.  Review of Systems     Objective:   Physical Exam        Assessment & Plan:   Patient tolerated injection well without complications. Patient advised to schedule next injection in 3 weeks.  Location: [facility]

## 2022-07-11 NOTE — Progress Notes (Signed)
Medical screening examination/treatment was performed by qualified clinical staff member and as supervising physician I was immediately available for consultation/collaboration. I have reviewed documentation and agree with assessment and plan.  Juandaniel Manfredo, DO  

## 2022-07-15 ENCOUNTER — Ambulatory Visit (INDEPENDENT_AMBULATORY_CARE_PROVIDER_SITE_OTHER): Payer: Medicare HMO | Admitting: Family Medicine

## 2022-07-15 ENCOUNTER — Encounter: Payer: Self-pay | Admitting: Family Medicine

## 2022-07-15 VITALS — BP 126/74 | HR 85 | Ht 65.0 in | Wt 261.7 lb

## 2022-07-15 DIAGNOSIS — E785 Hyperlipidemia, unspecified: Secondary | ICD-10-CM

## 2022-07-15 DIAGNOSIS — L209 Atopic dermatitis, unspecified: Secondary | ICD-10-CM | POA: Diagnosis not present

## 2022-07-15 DIAGNOSIS — E119 Type 2 diabetes mellitus without complications: Secondary | ICD-10-CM | POA: Diagnosis not present

## 2022-07-15 DIAGNOSIS — I10 Essential (primary) hypertension: Secondary | ICD-10-CM | POA: Diagnosis not present

## 2022-07-15 DIAGNOSIS — E291 Testicular hypofunction: Secondary | ICD-10-CM | POA: Diagnosis not present

## 2022-07-15 DIAGNOSIS — N400 Enlarged prostate without lower urinary tract symptoms: Secondary | ICD-10-CM

## 2022-07-15 MED ORDER — MOMETASONE FUROATE 0.1 % EX CREA: 1.0000 | TOPICAL_CREAM | Freq: Every day | CUTANEOUS | 0 refills | Status: AC

## 2022-07-15 NOTE — Assessment & Plan Note (Signed)
He feels pretty good with current strength of testosterone.  We will continue at this time.  Updating labs today.

## 2022-07-15 NOTE — Progress Notes (Signed)
Jonathan Yu - 71 y.o. male MRN [id number]  Date of birth: 1951/03/29  Subjective Chief Complaint  Patient presents with   Follow-up    HPI Jonathan Yu is a 71 year old male here today of skin irritation along the back.  Has had area has been irritated and itchy.  Was a little more swollen a few weeks ago.  No tenderness at this time.  Has not tried anything on the area so far.  Also has some dried, scaly areas on the back of the arms.  Reports these are quite itchy as well.  Denies pain sensation with this.  He would like to have his labs updated in regards to his other chronic medical conditions.  Close treated with current dose of testosterone.  No significant side effects at this time.  Blood sugar has been pretty well controlled with metformin.  He is not as active as he once was.  Feels like diet is about the same.  ROS:  A comprehensive ROS was completed and negative except as noted per HPI  Allergies  Allergen Reactions   Sesame Oil Hives and Shortness Of Breath   Clonazepam Hives   Tape Other (See Comments)    White surgical tape caused some blisters.   Percocet [Oxycodone-Acetaminophen] Rash   Soy Allergy Hives   Topamax [Topiramate]     Cognitive decline   Vicodin [Hydrocodone-Acetaminophen] Rash    Past Medical History:  Diagnosis Date   Allergy    DDD (degenerative disc disease), lumbar    Hyperlipidemia    "no meds since losing 106# 2 yr ago" (10/06/2013)   Hypertension 08/12/2010   "no meds since losing 106# 2 yr ago" (10/06/2013)   OSA on CPAP    "don't wear mask much since losing 106# 2 yr ago" (10/06/2013)   Parkinson's disease    Type II diabetes mellitus ([place])    "no meds since losing 106# 2 yr ago" (10/06/2013)   Walking pneumonia 08/12/1966    Past Surgical History:  Procedure Laterality Date   ANKLE FRACTURE SURGERY Left 1973   ANKLE RECONSTRUCTION Right 05/2011   CARPAL TUNNEL RELEASE Bilateral 2001   HARDWARE REMOVAL Right 10/07/2013   Procedure:  RIGHT ANKLE HARDWARE REMOVAL;  Surgeon: Newt Minion, MD;  Location: [place] [facility];  Service: Orthopedics;  Laterality: Right;   I & D EXTREMITY Right 11/18/2013   Procedure: IRRIGATION AND DEBRIDEMENT EXTREMITY;  Surgeon: Newt Minion, MD;  Location: [facility];  Service: Orthopedics;  Laterality: Right;  Irrigation and Debridement Right Fibula , Place Antibiotic Beads and VAC   NASAL SEPTUM SURGERY  1982   ORIF ANKLE FRACTURE Right 09/08/2013   Procedure: REPAIR SYNDESMOSIS DISRUPTION RIGHT ANKLE;  Surgeon: Newt Minion, MD;  Location: [facility];  Service: Orthopedics;  Laterality: Right;   SHOULDER ARTHROSCOPY W/ ROTATOR CUFF REPAIR Right 2006; 2009   TONSILLECTOMY  1959   TOTAL ANKLE ARTHROPLASTY Right 08/18/2013   Procedure: TOTAL ANKLE ARTHOPLASTY;  Surgeon: Newt Minion, MD;  Location: [facility];  Service: Orthopedics;  Laterality: Right;  Right Total Ankle Arthroplasty, Revision Fibular Fracture   URETERAL STENT PLACEMENT  ~ 2010 X 2    Social History   Socioeconomic History   Marital status: Divorced    Spouse name: Not on file   Number of children: 6   Years of education: 14   Highest education level: Some college, no degree  Occupational History   Occupation: sales/ DJ    Comment: part time  Tobacco Use  Smoking status: Some Days    Types: Pipe, Cigars   Smokeless tobacco: Never   Tobacco comments:    cigar 1-2 times/[place]  Vaping Use   Vaping Use: Never used  Substance and Sexual Activity   Alcohol use: Yes    Comment: 1-2 drinks per year.   Drug use: No   Sexual activity: Yes  Other Topics Concern   Not on file  Social History Narrative   Lives with his son and his daughter. He does Lobbyist, enjoys singing, music and yard work (when he can).    Social Determinants of Health   Financial Resource Strain: Low Risk  (05/03/2022)   Overall Financial Resource Strain (CARDIA)    Difficulty of Paying Living Expenses: Not hard at all  Food Insecurity: No Food Insecurity (05/03/2022)    Hunger Vital Sign    Worried About Running Out of Food in the Last Year: Never true    Ran Out of Food in the Last Year: Never true  Transportation Needs: No Transportation Needs (05/03/2022)   PRAPARE - Hydrologist (Medical): No    Lack of Transportation (Non-Medical): No  Physical Activity: Insufficiently Active (05/03/2022)   Exercise Vital Sign    Days of Exercise per Week: 1 day    Minutes of Exercise per Session: 60 min  Stress: No Stress Concern Present (05/03/2022)   [place]    Feeling of Stress : Not at all  Social Connections: Moderately Isolated (05/03/2022)   Social Connection and Isolation Panel [NHANES]    Frequency of Communication with Friends and Family: More than three times a week    Frequency of Social Gatherings with Friends and Family: More than three times a week    Attends Religious Services: More than 4 times per year    Active Member of Clubs or Organizations: No    Attends Archivist Meetings: Never    Marital Status: Divorced    Family History  Problem Relation Age of Onset   Cancer Mother 33       Lung   Alzheimer's disease Father    Parkinson's disease Father    Scoliosis Sister    Cleft lip Sister     Health Maintenance  Topic Date Due   Diabetic kidney evaluation - Urine ACR  09/12/2022 (Originally 07/16/2013)   FOOT EXAM  09/12/2022 (Originally 07/16/2013)   COVID-19 Vaccine (3 - Pfizer risk series) 11/16/2022 (Originally 12/08/2019)   HEMOGLOBIN A1C  07/18/2022   Diabetic kidney evaluation - GFR measurement  10/26/2022   OPHTHALMOLOGY EXAM  01/11/2023   Medicare Annual Wellness (AWV)  05/04/2023   COLONOSCOPY (Pts 45-61yr Insurance coverage will need to be confirmed)  06/03/2029   DTaP/Tdap/Td (3 - Td or Tdap) 05/11/2031   Pneumonia Vaccine 71 Years old  Completed   INFLUENZA VACCINE  Completed   Hepatitis C Screening  Completed    Zoster Vaccines- Shingrix  Completed   HPV VACCINES  Aged Out     ----------------------------------------------------------------------------------------------------------------------------------------------------------------------------------------------------------------- Physical Exam BP 126/74 (BP Location: Left Arm, Patient Position: Sitting, Cuff Size: Large)   Pulse 85   Ht '5\' 5"'$  (1.651 m)   Wt 261 lb 11.2 oz (118.7 kg)   SpO2 94%   BMI 43.55 kg/m   Physical Exam Constitutional:      Appearance: Normal appearance.  HENT:     Head: Normocephalic and atraumatic.  Eyes:     General: No scleral  icterus. Cardiovascular:     Rate and Rhythm: Normal rate and regular rhythm.  Abdominal:     Tenderness: There is no guarding.  Skin:    Comments: Erythematous, scaly patches along the middle back, back to the arms and abdomen.  Few of these areas are excoriated with some scabbing.  Neurological:     Mental Status: He is alert.  Psychiatric:        Mood and Affect: Mood normal.        Behavior: Behavior normal.     ------------------------------------------------------------------------------------------------------------------------------------------------------------------------------------------------------------------- Assessment and Plan  Essential hypertension, benign Blood pressure is well controlled at this time.  Recommend continuation of current medications.  Atopic dermatitis Scaly plaques on skin consistent with atopic dermatitis/eczema.  Trial of Elocon cream.  Recommend using daily moisturizer on these areas as well.  Type 2 diabetes mellitus without complication, without long-term current use of insulin ([place]) Encouraged to work on dietary change.  Continue metformin.  Update A1c.  Hypogonadism in male He feels pretty good with current strength of testosterone.  We will continue at this time.  Updating labs today.   Meds ordered this encounter   Medications   mometasone (ELOCON) 0.1 % cream    Sig: Apply 1 Application topically daily.    Dispense:  45 g    Refill:  0    No follow-ups on file.    This visit occurred during the SARS-CoV-2 public health emergency.  Safety protocols were in place, including screening questions prior to the visit, additional usage of staff PPE, and extensive cleaning of exam room while observing appropriate contact time as indicated for disinfecting solutions.

## 2022-07-15 NOTE — Assessment & Plan Note (Signed)
Blood pressure is well-controlled at this time.  Recommend continuation of current medications. 

## 2022-07-15 NOTE — Assessment & Plan Note (Signed)
Scaly plaques on skin consistent with atopic dermatitis/eczema.  Trial of Elocon cream.  Recommend using daily moisturizer on these areas as well.

## 2022-07-15 NOTE — Assessment & Plan Note (Signed)
Encouraged to work on dietary change.  Continue metformin.  Update A1c.

## 2022-07-16 ENCOUNTER — Ambulatory Visit: Payer: Medicare HMO | Admitting: Family Medicine

## 2022-07-18 DIAGNOSIS — N400 Enlarged prostate without lower urinary tract symptoms: Secondary | ICD-10-CM | POA: Diagnosis not present

## 2022-07-18 DIAGNOSIS — E291 Testicular hypofunction: Secondary | ICD-10-CM | POA: Diagnosis not present

## 2022-07-18 DIAGNOSIS — E785 Hyperlipidemia, unspecified: Secondary | ICD-10-CM | POA: Diagnosis not present

## 2022-07-18 DIAGNOSIS — E119 Type 2 diabetes mellitus without complications: Secondary | ICD-10-CM | POA: Diagnosis not present

## 2022-07-19 LAB — CBC WITH DIFFERENTIAL/PLATELET
Absolute Monocytes: 460 cells/uL (ref 200–950)
Basophils Absolute: 32 cells/uL (ref 0–200)
Basophils Relative: 0.5 %
Eosinophils Absolute: 107 cells/uL (ref 15–500)
Eosinophils Relative: 1.7 %
HCT: 44.8 % (ref 38.5–50.0)
Hemoglobin: 15.6 g/dL (ref 13.2–17.1)
Lymphs Abs: 2054 cells/uL (ref 850–3900)
MCH: 29.8 pg (ref 27.0–33.0)
MCHC: 34.8 g/dL (ref 32.0–36.0)
MCV: 85.5 fL (ref 80.0–100.0)
MPV: 9.7 fL (ref 7.5–12.5)
Monocytes Relative: 7.3 %
Neutro Abs: 3648 cells/uL (ref 1500–7800)
Neutrophils Relative %: 57.9 %
Platelets: 208 10*3/uL (ref 140–400)
RBC: 5.24 10*6/uL (ref 4.20–5.80)
RDW: 12.5 % (ref 11.0–15.0)
Total Lymphocyte: 32.6 %
WBC: 6.3 10*3/uL (ref 3.8–10.8)

## 2022-07-19 LAB — COMPLETE METABOLIC PANEL WITH GFR
AG Ratio: 1.6 (calc) (ref 1.0–2.5)
ALT: 32 U/L (ref 9–46)
AST: 24 U/L (ref 10–35)
Albumin: 4.2 g/dL (ref 3.6–5.1)
Alkaline phosphatase (APISO): 56 U/L (ref 35–144)
BUN: 15 mg/dL (ref 7–25)
CO2: 28 mmol/L (ref 20–32)
Calcium: 9.6 mg/dL (ref 8.6–10.3)
Chloride: 103 mmol/L (ref 98–110)
Creat: 0.88 mg/dL (ref 0.70–1.35)
Globulin: 2.6 g/dL (calc) (ref 1.9–3.7)
Glucose, Bld: 139 mg/dL — ABNORMAL HIGH (ref 65–99)
Potassium: 3.9 mmol/L (ref 3.5–5.3)
Sodium: 139 mmol/L (ref 135–146)
Total Bilirubin: 2.4 mg/dL — ABNORMAL HIGH (ref 0.2–1.2)
Total Protein: 6.8 g/dL (ref 6.1–8.1)
eGFR: 93 mL/min/{1.73_m2} (ref >=60)

## 2022-07-19 LAB — TESTOSTERONE: Testosterone: 889 ng/dL — ABNORMAL HIGH (ref 250–827)

## 2022-07-19 LAB — PSA: PSA: 4.15 ng/mL — ABNORMAL HIGH (ref <=4.00)

## 2022-07-19 LAB — HEMOGLOBIN A1C
Hgb A1c MFr Bld: 8.2 % of total Hgb — ABNORMAL HIGH (ref <5.7)
Mean Plasma Glucose: 189 mg/dL
eAG (mmol/L): 10.4 mmol/L

## 2022-07-19 LAB — LIPID PANEL W/REFLEX DIRECT LDL
Cholesterol: 131 mg/dL (ref <200)
HDL: 46 mg/dL (ref >=40)
LDL Cholesterol (Calc): 64 mg/dL (calc)
Non-HDL Cholesterol (Calc): 85 mg/dL (calc) (ref <130)
Total CHOL/HDL Ratio: 2.8 (calc) (ref <5.0)
Triglycerides: 127 mg/dL (ref <150)

## 2022-07-25 ENCOUNTER — Ambulatory Visit (INDEPENDENT_AMBULATORY_CARE_PROVIDER_SITE_OTHER): Payer: Medicare HMO | Admitting: Family Medicine

## 2022-07-25 ENCOUNTER — Encounter: Payer: Self-pay | Admitting: Family Medicine

## 2022-07-25 VITALS — BP 149/81 | HR 77 | Ht 65.0 in | Wt 259.7 lb

## 2022-07-25 DIAGNOSIS — R17 Unspecified jaundice: Secondary | ICD-10-CM | POA: Diagnosis not present

## 2022-07-25 DIAGNOSIS — R972 Elevated prostate specific antigen [PSA]: Secondary | ICD-10-CM

## 2022-07-25 DIAGNOSIS — I10 Essential (primary) hypertension: Secondary | ICD-10-CM

## 2022-07-25 DIAGNOSIS — G20C Parkinsonism, unspecified: Secondary | ICD-10-CM

## 2022-07-25 DIAGNOSIS — E291 Testicular hypofunction: Secondary | ICD-10-CM | POA: Diagnosis not present

## 2022-07-25 DIAGNOSIS — E119 Type 2 diabetes mellitus without complications: Secondary | ICD-10-CM | POA: Diagnosis not present

## 2022-07-25 MED ORDER — RYBELSUS 3 MG PO TABS: 3.0000 mg | ORAL_TABLET | Freq: Every day | ORAL | 0 refills | Status: DC

## 2022-07-25 MED ORDER — RYBELSUS 7 MG PO TABS: 7.0000 mg | ORAL_TABLET | Freq: Every day | ORAL | 1 refills | Status: DC

## 2022-07-25 NOTE — Progress Notes (Signed)
Jonathan Yu - 71 y.o. male MRN [id number]  Date of birth: [birth year]  Subjective No chief complaint on file.   HPI Jonathan Yu is a 71 year old male here today for follow-up visit.  He had labs completed at previous visit and would like to go over these today.  History of diabetes.  A1c has increased since last time to 8.2%.  Continues on metformin.  Previously on Rybelsus and tolerated well.  He would be willing to add this back on.  He plans to work on his diet some as well.  PSA was slightly elevated.  Denies any new symptoms of urinary retention or nocturia.  Bilirubin remains elevated, but appears chronic.  Likely benign cause.  I do not see that he has had any further evaluation of this in the past..  Denies any abdominal pain or nausea.  Blood pressure remains well-controlled.  He has noted a little more swelling in the left ankle.   ROS:  A comprehensive ROS was completed and negative except as noted per HPI   Allergies  Allergen Reactions   Sesame Oil Hives and Shortness Of Breath   Clonazepam Hives   Tape Other (See Comments)    White surgical tape caused some blisters.   Percocet [Oxycodone-Acetaminophen] Rash   Soy Allergy Hives   Topamax [Topiramate]     Cognitive decline   Vicodin [Hydrocodone-Acetaminophen] Rash    Past Medical History:  Diagnosis Date   Allergy    DDD (degenerative disc disease), lumbar    Hyperlipidemia    "no meds since losing 106# 2 yr ago" (10/06/2013)   Hypertension 08/12/2010   "no meds since losing 106# 2 yr ago" (10/06/2013)   OSA on CPAP    "don't wear mask much since losing 106# 2 yr ago" (10/06/2013)   Parkinson's disease    Type II diabetes mellitus ([place])    "no meds since losing 106# 2 yr ago" (10/06/2013)   Walking pneumonia 08/12/1966    Past Surgical History:  Procedure Laterality Date   ANKLE FRACTURE SURGERY Left 1973   ANKLE RECONSTRUCTION Right 05/2011   CARPAL TUNNEL RELEASE Bilateral 2001   HARDWARE REMOVAL Right  10/07/2013   Procedure: RIGHT ANKLE HARDWARE REMOVAL;  Surgeon: Newt Minion, MD;  Location: [place];  Service: Orthopedics;  Laterality: Right;   I & D EXTREMITY Right 11/18/2013   Procedure: IRRIGATION AND DEBRIDEMENT EXTREMITY;  Surgeon: Newt Minion, MD;  Location: [place];  Service: Orthopedics;  Laterality: Right;  Irrigation and Debridement Right Fibula , Place Antibiotic Beads and VAC   NASAL SEPTUM SURGERY  1982   ORIF ANKLE FRACTURE Right 09/08/2013   Procedure: REPAIR SYNDESMOSIS DISRUPTION RIGHT ANKLE;  Surgeon: Newt Minion, MD;  Location: [place];  Service: Orthopedics;  Laterality: Right;   SHOULDER ARTHROSCOPY W/ ROTATOR CUFF REPAIR Right 2006; 2009   TONSILLECTOMY  1959   TOTAL ANKLE ARTHROPLASTY Right 08/18/2013   Procedure: TOTAL ANKLE ARTHOPLASTY;  Surgeon: Newt Minion, MD;  Location: [place];  Service: Orthopedics;  Laterality: Right;  Right Total Ankle Arthroplasty, Revision Fibular Fracture   URETERAL STENT PLACEMENT  ~ 2010 X 2    Social History   Socioeconomic History   Marital status: Divorced    Spouse name: Not on file   Number of children: 6   Years of education: 14   Highest education level: Some college, no degree  Occupational History   Occupation: sales/ DJ    Comment: part time  Tobacco Use  Smoking status: Some Days    Types: Pipe, Cigars   Smokeless tobacco: Never   Tobacco comments:    cigar 1-2 times/mont  Vaping Use   Vaping Use: Never used  Substance and Sexual Activity   Alcohol use: Yes    Comment: 1-2 drinks per year.   Drug use: No   Sexual activity: Yes  Other Topics Concern   Not on file  Social History Narrative   Lives with his son and his daughter. He does Lobbyist, enjoys singing, music and yard work (when he can).    Social Determinants of Health   Financial Resource Strain: Low Risk  (05/03/2022)   Overall Financial Resource Strain (CARDIA)    Difficulty of Paying Living Expenses: Not hard at all  Food Insecurity: No Food  Insecurity (05/03/2022)   Hunger Vital Sign    Worried About Running Out of Food in the Last Year: Never true    Ran Out of Food in the Last Year: Never true  Transportation Needs: No Transportation Needs (05/03/2022)   PRAPARE - Hydrologist (Medical): No    Lack of Transportation (Non-Medical): No  Physical Activity: Insufficiently Active (05/03/2022)   Exercise Vital Sign    Days of Exercise per Week: 1 day    Minutes of Exercise per Session: 60 min  Stress: No Stress Concern Present (05/03/2022)   [place]    Feeling of Stress : Not at all  Social Connections: Moderately Isolated (05/03/2022)   Social Connection and Isolation Panel [NHANES]    Frequency of Communication with Friends and Family: More than three times a week    Frequency of Social Gatherings with Friends and Family: More than three times a week    Attends Religious Services: More than 4 times per year    Active Member of Clubs or Organizations: No    Attends Archivist Meetings: Never    Marital Status: Divorced    Family History  Problem Relation Age of Onset   Cancer Mother 62       Lung   Alzheimer's disease Father    Parkinson's disease Father    Scoliosis Sister    Cleft lip Sister     Health Maintenance  Topic Date Due   Diabetic kidney evaluation - Urine ACR  09/12/2022 (Originally 07/16/2013)   FOOT EXAM  09/12/2022 (Originally 07/16/2013)   COVID-19 Vaccine (3 - Pfizer risk series) 11/16/2022 (Originally 12/08/2019)   OPHTHALMOLOGY EXAM  01/11/2023   HEMOGLOBIN A1C  01/17/2023   Medicare Annual Wellness (AWV)  05/04/2023   Diabetic kidney evaluation - eGFR measurement  07/19/2023   COLONOSCOPY (Pts 45-29yr Insurance coverage will need to be confirmed)  06/03/2029   DTaP/Tdap/Td (3 - Td or Tdap) 05/11/2031   Pneumonia Vaccine 71 Years old  Completed   INFLUENZA VACCINE  Completed   Hepatitis C  Screening  Completed   Zoster Vaccines- Shingrix  Completed   HPV VACCINES  Aged Out     ----------------------------------------------------------------------------------------------------------------------------------------------------------------------------------------------------------------- Physical Exam BP (!) 149/81 (BP Location: Left Arm, Patient Position: Sitting, Cuff Size: Large)   Pulse 77   Ht _0  (1.651 m)   Wt 259 lb 11.2 oz (117.8 kg)   SpO2 94%   BMI 43.22 kg/m   Physical Exam Constitutional:      Appearance: Normal appearance.  HENT:     Head: Normocephalic and atraumatic.  Eyes:     General: No  scleral icterus. Cardiovascular:     Rate and Rhythm: Normal rate and regular rhythm.  Pulmonary:     Effort: Pulmonary effort is normal.     Breath sounds: Normal breath sounds.  Musculoskeletal:     Cervical back: Neck supple.  Neurological:     Mental Status: He is alert.  Psychiatric:        Mood and Affect: Mood normal.        Behavior: Behavior normal.     ------------------------------------------------------------------------------------------------------------------------------------------------------------------------------------------------------------------- Assessment and Plan  Essential hypertension, benign Blood pressure is well-controlled.  He has had a little more swelling in the left foot and ankle.  He may add Lasix back on the use every other day for the next couple weeks.  Reminded to keep legs elevated.  Hypogonadism in male Feels pretty good with current strength of testosterone.   His recent PSA was slightly elevated.  Rechecking in 1 month.  If this remains elevated we discussed recommendations to discontinue testosterone until seen by urology.  Type 2 diabetes mellitus without complication, without long-term current use of insulin (HCC) A1c has worsened since last visit.  Adding Rybelsus back on.  Start with 3 mg tablets 30 days  then increase to 7 mg.  Continue to work on dietary change.  Parkinsonism ([place]) Stable at this time.  He will continue to see neurology for management.  Elevated bilirubin Appears to be chronic in nature.  Repeating in 1 month.   Meds ordered this encounter  Medications   Semaglutide (RYBELSUS) 7 MG TABS    Sig: Take 7 mg by mouth daily.    Dispense:  90 tablet    Refill:  1   Semaglutide (RYBELSUS) 3 MG TABS    Sig: Take 3 mg by mouth daily. Lot: [id number] Exp: 03/2023    Dispense:  30 tablet    Refill:  0    <SAMPLE>    Return in about 3 months (around 10/24/2022) for T2DM.    This visit occurred during the SARS-CoV-2 public health emergency.  Safety protocols were in place, including screening questions prior to the visit, additional usage of staff PPE, and extensive cleaning of exam room while observing appropriate contact time as indicated for disinfecting solutions.

## 2022-07-25 NOTE — Assessment & Plan Note (Signed)
Appears to be chronic in nature.  Repeating in 1 month.

## 2022-07-25 NOTE — Assessment & Plan Note (Signed)
Feels pretty good with current strength of testosterone.   His recent PSA was slightly elevated.  Rechecking in 1 month.  If this remains elevated we discussed recommendations to discontinue testosterone until seen by urology.

## 2022-07-25 NOTE — Assessment & Plan Note (Signed)
Stable at this time.  He will continue to see neurology for management.

## 2022-07-25 NOTE — Patient Instructions (Signed)
You may use fluid pill every other day for the next 1-2 weeks for swelling in the foot/ankle.  Start rybelsus '3mg'$  daily for 1 month then increase to '7mg'$ .  Have labs completed in one month for prostate and bilirubin.

## 2022-07-25 NOTE — Assessment & Plan Note (Signed)
Blood pressure is well-controlled.  He has had a little more swelling in the left foot and ankle.  He may add Lasix back on the use every other day for the next couple weeks.  Reminded to keep legs elevated.

## 2022-07-25 NOTE — Assessment & Plan Note (Signed)
A1c has worsened since last visit.  Adding Rybelsus back on.  Start with 3 mg tablets 30 days then increase to 7 mg.  Continue to work on dietary change.

## 2022-08-01 ENCOUNTER — Ambulatory Visit (INDEPENDENT_AMBULATORY_CARE_PROVIDER_SITE_OTHER): Payer: Medicare HMO | Admitting: Family Medicine

## 2022-08-01 VITALS — BP 130/78 | HR 74 | Ht 65.0 in | Wt 256.0 lb

## 2022-08-01 DIAGNOSIS — E291 Testicular hypofunction: Secondary | ICD-10-CM | POA: Diagnosis not present

## 2022-08-01 NOTE — Progress Notes (Signed)
Medical screening examination/treatment was performed by qualified clinical staff member and as supervising physician I was immediately available for consultation/collaboration. I have reviewed documentation and agree with assessment and plan.  Mazey Mantell, DO  

## 2022-08-01 NOTE — Progress Notes (Signed)
   Subjective:    Patient ID: Jonathan Yu, male    DOB: 1950-12-11, 71 y.o.   MRN: [id number]  HPI Pt here for testosterone injection, pt denies chest pain, shortness of breath, headaches and problems with medication or mood changes.    Review of Systems     Objective:   Physical Exam        Assessment & Plan:  Pt tolerated injection well RUOQ without complications. Pt advised to schedule next injection in 21 days.

## 2022-08-22 ENCOUNTER — Ambulatory Visit (INDEPENDENT_AMBULATORY_CARE_PROVIDER_SITE_OTHER): Payer: Medicare HMO | Admitting: Family Medicine

## 2022-08-22 VITALS — BP 130/77 | HR 81 | Ht 65.0 in | Wt 260.2 lb

## 2022-08-22 DIAGNOSIS — E291 Testicular hypofunction: Secondary | ICD-10-CM

## 2022-08-22 MED ORDER — TESTOSTERONE CYPIONATE 200 MG/ML IM SOLN
200.0000 mg | INTRAMUSCULAR | Status: DC
  Administered 2022-08-22 – 2022-12-05 (×5): 200 mg via INTRAMUSCULAR

## 2022-08-22 NOTE — Progress Notes (Signed)
Medical screening examination/treatment was performed by qualified clinical staff member and as supervising physician I was immediately available for consultation/collaboration. I have reviewed documentation and agree with assessment and plan.  Carmyn Hamm, DO  

## 2022-08-22 NOTE — Progress Notes (Signed)
   Subjective:    Patient ID: Jonathan Yu, male    DOB: [birth year], 72 y.o.   MRN: [id number]  HPI Pt here for testosterone injection, pt denies chest pain shortness of breath, headache, and problems with medication or mood swings.    Review of Systems     Objective:   Physical Exam        Assessment & Plan:  Pt tolerated injection well in LUOQ without complications. Pt advised to schedule next injection in 3 weeks

## 2022-08-23 ENCOUNTER — Other Ambulatory Visit: Payer: Self-pay | Admitting: Family Medicine

## 2022-09-12 ENCOUNTER — Ambulatory Visit (INDEPENDENT_AMBULATORY_CARE_PROVIDER_SITE_OTHER): Payer: Medicare HMO | Admitting: Family Medicine

## 2022-09-12 VITALS — BP 145/92 | HR 84 | Ht 65.0 in | Wt 260.0 lb

## 2022-09-12 DIAGNOSIS — E291 Testicular hypofunction: Secondary | ICD-10-CM | POA: Diagnosis not present

## 2022-09-12 MED ORDER — TESTOSTERONE CYPIONATE 200 MG/ML IM SOLN
200.0000 mg | Freq: Once | INTRAMUSCULAR | Status: AC
  Administered 2022-09-12: 200 mg via INTRAMUSCULAR

## 2022-09-12 NOTE — Progress Notes (Signed)
   Subjective:    Patient ID: Jonathan Yu, male    DOB: 1951/02/06, 72 y.o.   MRN: [id number]  HPI Pt here for testosterone injection, pt denies chest pain, sob, mood swings or problems with medication.    Review of Systems     Objective:   Physical Exam        Assessment & Plan:  Pt tolerated injection well in RUOQ, without any complications. Pt advised to schedule next injection in 21 days.

## 2022-09-12 NOTE — Progress Notes (Signed)
Medical screening examination/treatment was performed by qualified clinical staff member and as supervising physician I was immediately available for consultation/collaboration. I have reviewed documentation and agree with assessment and plan.  Cattleya Dobratz, DO  

## 2022-09-13 LAB — TESTOSTERONE: Testosterone: 296 ng/dL (ref 250–827)

## 2022-09-23 ENCOUNTER — Encounter: Payer: Self-pay | Admitting: Family Medicine

## 2022-09-23 MED ORDER — RYBELSUS 7 MG PO TABS: 7.0000 mg | ORAL_TABLET | Freq: Every day | ORAL | 1 refills | Status: DC

## 2022-09-23 NOTE — Telephone Encounter (Signed)
Spoke with patient  Ryblesus 33m sample placed at front desk for patient pick up.

## 2022-09-26 ENCOUNTER — Ambulatory Visit: Payer: Medicare HMO | Admitting: Neurology

## 2022-09-26 VITALS — BP 160/93 | HR 79 | Ht 65.5 in | Wt 262.5 lb

## 2022-09-26 DIAGNOSIS — G4733 Obstructive sleep apnea (adult) (pediatric): Secondary | ICD-10-CM

## 2022-09-26 DIAGNOSIS — G20A2 Parkinson's disease without dyskinesia, with fluctuations: Secondary | ICD-10-CM | POA: Diagnosis not present

## 2022-09-26 DIAGNOSIS — F419 Anxiety disorder, unspecified: Secondary | ICD-10-CM

## 2022-09-26 NOTE — Patient Instructions (Addendum)
Please continue using your CPAP regularly. While your insurance requires that you use CPAP at least 4 hours each night on 70% of the nights, I recommend, that you not skip any nights and use it throughout the night if you can. Getting used to CPAP and staying with the treatment long term does take time and patience and discipline. Untreated obstructive sleep apnea when it is moderate to severe can have an adverse impact on cardiovascular health and raise her risk for heart disease, arrhythmias, hypertension, congestive heart failure, stroke and diabetes. Untreated obstructive sleep apnea causes sleep disruption, nonrestorative sleep, and sleep deprivation. This can have an impact on your day to day functioning and cause daytime sleepiness and impairment of cognitive function, memory loss, mood disturbance, and problems focussing. Using CPAP regularly can improve these symptoms. We we will continue with your current medication regimen.  Please follow-up to see Jonathan [name] in 6 months.

## 2022-09-26 NOTE — Progress Notes (Addendum)
PAP supply order sent to [facility] and receipt of order confirmed.

## 2022-09-26 NOTE — Progress Notes (Signed)
Subjective:    [name] ID: Jonathan [name] is a 72 y.o. male.  HPI    Interim history:   Jonathan [name] is a 72 year old right-handed gentleman with an underlying medical history of degenerative disc disease, hyperlipidemia, hypertension, diabetes, vitamin D deficiency, obstructive sleep apnea (on PAP therapy), arthritis, status post multiple surgeries, allergies, low testosterone, PD and severe obesity with a BMI of over 40, who presents for follow-up consultation of his obstructive sleep apnea as well as Parkinson's disease.  Jonathan [name] is unaccompanied today.  I last saw Jonathan [name] on 10/04/2021 for evaluation of his obstructive sleep apnea.  Jonathan [name] was compliant with his CPAP machine which was older.  Jonathan [name] was advised to proceed with a sleep study for reevaluation.  Jonathan [name] had a baseline sleep study on 10/30/2021 which showed a total AHI of 6/h, O2 nadir 87%.     Jonathan [name] had a follow-up appointment with Jonathan Givens, NP on 03/26/2022, at which time Jonathan [name] was on his Respironics DreamStation machine.  Jonathan [name] was compliant with treatment with AutoPap, 90th percentile of pressure was 13 cm.  His average AHI was mildly elevated at 8.4.  Jonathan [name] reported residual anxiety and his BuSpar was increased to 5 mg up to 4 times a day as needed and his Requip was increased from 2 mg 3 times daily to 3 mg 3 times daily.  Today, 09/26/2022: Jonathan [name] reports doing well with his AutoPap machine, Jonathan [name] is compliant with treatment, Jonathan [name] does need new supplies particularly in new mask.  Jonathan [name] had a recent near fall, his smart watch picked up on it, Jonathan [name] did not injure himself and fell forward but did not land on Jonathan ground.  Sometimes uses a cane, Jonathan [name] does have discomfort in his lower back and both hips and both ankles typically.  Jonathan [name] never increased his ropinirole from 2 mg 3 times daily to 3 mg 3 times daily.  Jonathan [name] feels that his anxiety if anything is more of an issue, Jonathan [name] takes buspirone 5 mg 3 times a day, sometimes a fourth dose.  Sometimes Jonathan ropinirole makes Jonathan [name]  sleepy.  Jonathan [name] tries to stay active, Jonathan [name] gets massage therapy from time to time.  Jonathan [name] tries to hydrate well.  Previously:   I saw Jonathan [name] on 07/25/2021, at which time Jonathan [name] reported that his insurance would no longer cover Jonathan patch after Jonathan first of Jonathan year.  Jonathan [name] had a 9-monthsupply at Jonathan time.  Jonathan [name] was compliant with his CPAP of 13 cm.  Jonathan [name] was using furosemide as needed for lower extremity swelling.  Jonathan [name] was trying to exercise more.  I suggested we switch Jonathan [name] to ropinirole 0.5 mg strength 1 p.o. 3 times daily with gradual increase to 1.5 mg 3 times daily.  Jonathan [name] was on BuSpar for anxiety.  I increase Jonathan BuSpar to 5 mg 3 times daily. Jonathan [name]'s allergies, current medications, family history, past medical history, past social history, past surgical history and problem list were reviewed and updated as appropriate.       10/04/2021: I reviewed his CPAP compliance data from 09/04/2021 through 10/03/2021, which is a total of 30 days, during which time Jonathan [name] used his machine 29 days with percent use days greater than 4 hours at 86.7%, indicating very good compliance with an average usage of 6 hours and 58 minutes, residual AHI borderline at 5.2/h, pressure at 13 cm with a ramp time of 20 minutes, ramp start at 12 cm.  Jonathan [name] reports having had sleep testing in 1998. Jonathan [name]  received a replacement Respironics machine in December 2020 and BiPAP machine was affected by Jonathan recall so Jonathan [name] never actually opened even Jonathan box and it still sitting in Jonathan original box.  His Epworth sleepiness score is 12 out of 24, fatigue severity score is 46 out of 63.  His DME company is Jonathan [name].  Jonathan [name] is on Jonathan list to get a replacement from [employer] directly for Jonathan recall.  Jonathan [name] does not have an update for this.  His sleep schedule is erratic, in part because of his work schedule particularly on Jonathan weekends.  Jonathan [name] may come home for 5 AM.  Jonathan [name] has discomfort in Jonathan knees and both ankles, particularly Jonathan left ankle bothers Jonathan [name] now.  Jonathan [name] goes to physical therapy  regularly for his legs.  Jonathan [name] has seen Jonathan [name] in orthopedics and had a right ankle replacement some 10 years ago.  Jonathan [name] has switched over to ropinirole, currently on 1.5 mg 3 times daily, Jonathan [name] has noticed some daytime tiredness.  Sometimes Jonathan [name] sleeps in his lift chair, reclined.  When Jonathan [name] is not working, Jonathan [name] may go to bed around 10 PM but his sleep is interrupted, Jonathan [name] will get up after 2 hours.  Jonathan [name] has nocturia about 3-4 times per average night, Jonathan [name] gets out of bed at 6:30 AM to take his youngest daughter to school.  Jonathan [name] does not drink caffeine daily, Jonathan [name] drinks alcohol rarely, maybe 2 or 3 times a year and smokes a cigar occasionally, no cigarettes.     I saw Jonathan [name] on 03/26/2021, at which time Jonathan [name] reported that Neupro had helped.  Jonathan [name] was able to tolerate Jonathan patch.  Jonathan [name] was motivated to start exercising regularly.  Jonathan Neupro was quite expensive.  Jonathan [name] was advised to continue with Neupro patch 4 mg once daily and monitor his lower extremity swelling.  For his anxiety, I suggested Jonathan [name] start BuSpar 5 mg strength as needed.     I saw Jonathan [name] on 12/28/2020, at which time I suggested Jonathan [name] start Neupro patch.  We talked about his DaTscan results supporting Parkinson's disease.        I first met Jonathan [name] on 11/22/2020 at Jonathan request of his primary care physician, at which time Jonathan [name] gave an approximately 2-year history of tremors affecting primarily his right lower and upper extremities.  His presentation was concerning for parkinsonism.  Jonathan [name] was advised to proceed with a nuclear medicine DaTscan.   Jonathan [name] had a DaTscan on 12/26/2020 and I reviewed Jonathan results:  IMPRESSION: Marked decreased activity within Jonathan bilateral posterior striatum is a pattern typical of Parkinsonian syndrome pathology.   Of note, DaTSCAN is not diagnostic of Parkinsonian syndromes, which remains a clinical diagnosis. DaTscan is an adjuvant test to aid in Jonathan clinical diagnosis of Parkinsonian syndromes.   We called Jonathan [name] with his test results.      11/22/20: (Jonathan [name]) reports an approximately 2-year history of tremors affecting primarily Jonathan right side.  Jonathan [name] first noticed right foot tremor.  His tremor has progressed, sometimes his left side feels worse, Jonathan [name] has also noticed fine motor dyscontrol and difficulty with movement in general, feels fatigued.  His symptoms got worse when Jonathan [name] had Covid some 18 months ago and also reports that his anxiety has become much more noticeable.  Previously, Jonathan [name] was not an anxious person, now Jonathan [name] is often anxious.  Jonathan [name] does have a prescription for Xanax but rarely uses it.  Jonathan [name] has not fallen recently.  Jonathan [name] tries to stay active.  Jonathan [name] has multiple joint related issues including right ankle replacement, left ankle injury and repair some 50 years ago, no hardware in place but right ankle replacement some 10 years ago after an accident.  Jonathan [name] quit working after that.  Jonathan [name] also has right knee pain and bilateral rotator cuff problems, status post surgeries twice on Jonathan right, no surgery on Jonathan left but significant decrease in range of motion in Jonathan left shoulder.  Jonathan [name] works with a physical therapist about once a week.  Jonathan [name] has not been exercising as such on a regular basis.  Jonathan [name] is generally concerned about his constellation of symptoms such as tremors, mobility issues, problems with fatigue and overall deconditioning.  Jonathan [name] is divorced and lives with his youngest daughter who is 9 and his son who is 45, and divorced.  Jonathan [name] has a total of 6 children.  Jonathan [name] himself is Jonathan oldest of 5 kids, had 2 sisters and 2 brothers, younger sister passed away.  His father had Parkinson's disease later in life, lived to be 55, mom died from lung cancer at 23.  Jonathan [name] does not smoke cigarettes.  Jonathan [name] does occasionally smoke a cigar but none in a year approximately.  Jonathan [name] drinks alcohol rarely.  Jonathan [name] does not drink caffeine on a day-to-day basis.   Jonathan [name] uses his CPAP faithfully.  Jonathan [name] is retired from working with [employer] for many years.  Jonathan [name] does have a remote history of dream  enactment behaviors.  These have been infrequently now.  Jonathan [name] fell out of bed some 3 years ago as Jonathan [name] was dreaming about fighting somebody.  Jonathan [name] does have a history of sleep talking.   I reviewed your office note from 05/14/2021. Jonathan [name] had blood work through your office on 09/07/2020 and I reviewed Jonathan results, vitamin D was mildly low at 27.  His TSH was normal in August 2021.  His B12 was 407 on 03/16/2020.  A1c was 6.0.   His Past Medical History Is Significant For: Past Medical History:  Diagnosis Date   Allergy    DDD (degenerative disc disease), lumbar    Hyperlipidemia    "no meds since losing 106# 2 yr ago" (10/06/2013)   Hypertension 08/12/2010   "no meds since losing 106# 2 yr ago" (10/06/2013)   OSA on CPAP    "don't wear mask much since losing 106# 2 yr ago" (10/06/2013)   Parkinson's disease    Type II diabetes mellitus ([place])    "no meds since losing 106# 2 yr ago" (10/06/2013)   Walking pneumonia 08/12/1966    His Past Surgical History Is Significant For: Past Surgical History:  Procedure Laterality Date   ANKLE FRACTURE SURGERY Left 1973   ANKLE RECONSTRUCTION Right 05/2011   CARPAL TUNNEL RELEASE Bilateral 2001   HARDWARE REMOVAL Right 10/07/2013   Procedure: RIGHT ANKLE HARDWARE REMOVAL;  Surgeon: Newt Minion, MD;  Location: [place];  Service: Orthopedics;  Laterality: Right;   I & D EXTREMITY Right 11/18/2013   Procedure: IRRIGATION AND DEBRIDEMENT EXTREMITY;  Surgeon: Newt Minion, MD;  Location: [place];  Service: Orthopedics;  Laterality: Right;  Irrigation and Debridement Right Fibula , Place Antibiotic Beads and VAC   NASAL SEPTUM SURGERY  1982   ORIF ANKLE FRACTURE Right 09/08/2013   Procedure: REPAIR SYNDESMOSIS DISRUPTION RIGHT ANKLE;  Surgeon: Newt Minion, MD;  Location: [place];  Service: Orthopedics;  Laterality: Right;   SHOULDER ARTHROSCOPY W/ ROTATOR CUFF REPAIR Right 2006; 2009   TONSILLECTOMY  1959  TOTAL ANKLE ARTHROPLASTY Right 08/18/2013   Procedure: TOTAL ANKLE  ARTHOPLASTY;  Surgeon: Newt Minion, MD;  Location: [place];  Service: Orthopedics;  Laterality: Right;  Right Total Ankle Arthroplasty, Revision Fibular Fracture   URETERAL STENT PLACEMENT  ~ 2010 X 2    His Family History Is Significant For: Family History  Problem Relation Age of Onset   Cancer Mother 22       Lung   Alzheimer's disease Father    Parkinson's disease Father    Scoliosis Sister    Cleft lip Sister     His Social History Is Significant For: Social History   Socioeconomic History   Marital status: Divorced    Spouse name: Not on file   Number of children: 6   Years of education: 14   Highest education level: Some college, no degree  Occupational History   Occupation: sales/ DJ    Comment: part time  Tobacco Use   Smoking status: Some Days    Types: Pipe, Cigars   Smokeless tobacco: Never   Tobacco comments:    cigar 1-2 times/mont  Vaping Use   Vaping Use: Never used  Substance and Sexual Activity   Alcohol use: Yes    Comment: 1-2 drinks per year.   Drug use: No   Sexual activity: Yes  Other Topics Concern   Not on file  Social History Narrative   Lives with his son and his daughter. 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    Social Determinants of Health   Financial Resource Strain: Low Risk  (05/03/2022)   Overall Financial Resource Strain (CARDIA)    Difficulty of Paying Living Expenses: Not hard at all  Food Insecurity: No Food Insecurity (05/03/2022)   Hunger Vital Sign    Worried About Running Out of Food in Jonathan Last Year: Never true    Ran Out of Food in Jonathan Last Year: Never true  Transportation Needs: No Transportation Needs (05/03/2022)   PRAPARE - Hydrologist (Medical): No    Lack of Transportation (Non-Medical): No  Physical Activity: Insufficiently Active (05/03/2022)   Exercise Vital Sign    Days of Exercise per Week: 1 day    Minutes of Exercise per Session: 60 min  Stress:  No Stress Concern Present (05/03/2022)   Yell    Feeling of Stress : Not at all  Social Connections: Moderately Isolated (05/03/2022)   Social Connection and Isolation Panel [NHANES]    Frequency of Communication with Friends and Family: More than three times a week    Frequency of Social Gatherings with Friends and Family: More than three times a week    Attends Religious Services: More than 4 times per year    Active Member of [employer] or Organizations: No    Attends Archivist Meetings: Never    Marital Status: Divorced    His Allergies Are:  Allergies  Allergen Reactions   Sesame Oil Hives and Shortness Of Breath   Clonazepam Hives   Tape Other (See Comments)    White surgical tape caused some blisters.   Percocet [Oxycodone-Acetaminophen] Rash   Soy Allergy Hives   Topamax [Topiramate]     Cognitive decline   Vicodin [Hydrocodone-Acetaminophen] Rash  :   His Current Medications Are:  Outpatient Encounter Medications as of 09/26/2022  Medication Sig   albuterol (PROVENTIL HFA;VENTOLIN HFA) 108 (90 Base)  MCG/ACT inhaler Inhale 2 puffs into Jonathan lungs every 6 (six) hours as needed for wheezing or shortness of breath.   AMBULATORY NON FORMULARY MEDICATION Continue CPAP at current settings.  Please provide new supplies. Micheal Likens phone number [phone]   AMBULATORY NON FORMULARY MEDICATION Compression stockings.  Vive size LGM Leg swelling. Disp 1 pair 99 refil.   anastrozole (ARIMIDEX) 1 MG tablet TAKE 1 TABLET EVERY DAY   atorvastatin (LIPITOR) 40 MG tablet TAKE 1 TABLET EVERY DAY   Azelaic Acid 15 % cream Apply 1 application topically 2 (two) times daily. ([name] taking differently: Apply 1 application  topically 2 (two) times daily as needed.)   benzonatate (TESSALON) 200 MG capsule Take 1 capsule (200 mg total) by mouth 2 (two) times daily as needed for cough.   busPIRone (BUSPAR) 5 MG  tablet Take 1 tablet (5 mg total) by mouth 4 (four) times daily as needed.   docusate sodium (COLACE) 50 MG capsule Take 50 mg by mouth daily as needed.   fluticasone (FLONASE) 50 MCG/ACT nasal spray Place into both nostrils daily.   furosemide (LASIX) 20 MG tablet TAKE 1 TABLET BY MOUTH ONCE DAILY AS NEEDED FOR EDEMA   loratadine (CLARITIN) 10 MG tablet Take 10 mg by mouth daily.   losartan (COZAAR) 50 MG tablet TAKE 1 TABLET EVERY DAY   Misc. Devices (FLEX THERAPY) MISC by Does not apply route.   mometasone (ELOCON) 0.1 % cream Apply 1 Application topically daily.   Multiple Vitamin (MULTIVITAMIN WITH MINERALS) TABS tablet Take 1 tablet by mouth daily.   Probiotic Product (PROBIOTIC PO) Take by mouth daily.   rOPINIRole (REQUIP) 2 MG tablet Take 1.5 tablets (3 mg total) by mouth 3 (three) times daily.   Semaglutide (RYBELSUS) 3 MG TABS Take 3 mg by mouth daily. Lot: [id number] Exp: 03/2023   tadalafil (CIALIS) 20 MG tablet TAKE 1/2 TO 1 TABLET BY MOUTH EVERY OTHER DAY FOR ED   tamsulosin (FLOMAX) 0.4 MG CAPS capsule TAKE 1 CAPSULE BY MOUTH DAILY AFTER BREAKFAST.   traMADol (ULTRAM) 50 MG tablet TAKE 1 TABLET BY MOUTH EVERY 8 HOURS AS NEEDED FOR MODERATE PAIN   VITAMIN D PO Take by mouth.   Semaglutide (RYBELSUS) 7 MG TABS Take 1 tablet (7 mg total) by mouth daily. ([name] not taking: Reported on 09/26/2022)   [DISCONTINUED] metFORMIN (GLUCOPHAGE-XR) 500 MG 24 hr tablet Take 1 tablet by mouth once daily with breakfast ([name] not taking: Reported on 09/26/2022)   [DISCONTINUED] predniSONE (DELTASONE) 50 MG tablet Take 17m daily x5 days.   Facility-Administered Encounter Medications as of 09/26/2022  Medication   testosterone cypionate (DEPOTESTOSTERONE CYPIONATE) injection 200 mg   testosterone cypionate (DEPOTESTOSTERONE CYPIONATE) injection 200 mg  :  Review of Systems:  Out of a complete 14 point review of systems, all are reviewed and negative with Jonathan exception of these symptoms as  listed below:  Review of Systems  Neurological:        Doing ok using cpap (needs supplies). DME [employer].  Progressing, shuffle steps, fine motor skills decreasing. ESS     Objective:  Neurological Exam  Physical Exam Physical Examination:   Vitals:   09/26/22 1308  BP: (!) 150/93  Pulse: 73    General Examination: Jonathan [name] is a very pleasant 72y.o. male in no acute distress. Jonathan [name] appears well-developed and well-nourished and well groomed.   HEENT: Normocephalic, atraumatic, pupils are equal, round and reactive to light, extraocular tracking is generally preserved, mild to  moderate decrease in eye blink rate noted and mild to moderate facial masking noted.  Dry eyes bilaterally.  Mild nuchal rigidity noted, no lip, neck or jaw tremor, no carotid bruits.  Airway examination reveals mild mouth dryness, tongue protrudes centrally and palate elevates symmetrically.  Moderate airway crowding.  Thicker tongue noted.  No significant hypophonia, no dysarthria noted.    Chest: Clear to auscultation without wheezing, rhonchi or crackles noted.   Heart: S1+S2+0, regular and normal without murmurs, rubs or gallops noted.    Abdomen: Soft, non-tender and non-distended.       Extremities: There is 1+ pitting edema in both lower extremities, more so on Jonathan left side.  Left leg larger in caliber than right, stable.     Skin: Warm and dry without trophic changes noted.    Musculoskeletal: exam reveals decreased range of motion in both shoulders, right shoulder is higher than left.  Scar left ankle and distal leg, scar right ankle, from prior surgeries, mild bilateral knee discomfort.  Mild bilateral hip discomfort, mild lower back discomfort.      Neurologically:  Mental status: Jonathan [name] is awake, alert and oriented in all 4 spheres. His immediate and remote memory, attention, language skills and fund of knowledge are appropriate. There is no evidence of aphasia, agnosia, apraxia or anomia.  Mood is normal and affect is normal.  Cranial nerves II - XII are as described above under HEENT exam.  Unequal shoulder height noted.   Motor exam: Normal bulk, mild increase in tone in Jonathan right more than left upper extremity, no telltale cogwheeling.  Mild to moderate bradykinesia.  Jonathan [name] has a mild intermittent resting tremor in Jonathan right upper and right lower extremity.  Jonathan [name] has no significant postural or action tremor. No left sided resting tremor.    (On 11/22/2020: On Archimedes spiral drawing Jonathan [name] has no  significant trembling with Jonathan right hand which is his dominant hand, insecurity noted with Jonathan left hand but no trembling as such, handwriting is on Jonathan smaller side, legible.)    Fine motor skills shows mild to moderate impairment on Jonathan right side, slightly better on Jonathan left.  Cerebellar testing: No dysmetria or intention tremor. There is no truncal or gait ataxia.  Sensory exam: intact to light touch in Jonathan upper and lower extremities.  Gait, station and balance: Jonathan [name] stands with mild difficulty, pushes himself up, posture is age-appropriate to slightly stooped for age.  Jonathan [name] stands wide-based. Jonathan [name] walks with no telltale shuffling but decreased arm swing on Jonathan right.  Jonathan [name] walks slightly slowly, turns slowly.    Assessment and Plan:    In summary, Jonathan [name] is a very pleasant 72 year old male with an underlying medical history of degenerative disc disease, hyperlipidemia, hypertension, diabetes, vitamin D deficiency, obstructive sleep apnea on CPAP therapy, arthritis, status post multiple surgeries allergies, low testosterone, and severe obesity with a BMI of over 40, who presents for follow-up consultation of his Parkinson's disease and sleep apnea.  Jonathan [name] has been compliant with his CPAP of 13 cm.  Jonathan [name] is stable from Jonathan Parkinson's standpoint, never actually increased his ropinirole from 2 mg 3 times daily to 3 mg 3 times daily in August 2023.  Jonathan [name] feels stable on Jonathan lower dose and would like to  avoid anything that makes Jonathan [name] more drowsy.  Jonathan [name] does have anxiety and BuSpar has been helpful, Jonathan [name] generally takes 5 mg 3 times daily and an optional fourth dose when needed.  Jonathan [name]  started ropinirole in lieu of Neupro in December 2022 as his insurance no longer covered Jonathan Neupro patch.  Jonathan [name] had a DaTscan on 12/26/2020 which showed marked decreased activity within Jonathan bilateral posterior striatum, and was, as such, supportive of an underlying parkinsonian syndrome.  No obvious atypical features at this time.  Jonathan [name] started Neupro patch in May 2022 with good tolerance.  Jonathan [name] was able to increase it to 4 mg once daily.  Jonathan [name] has a history of dream enactment behavior, currently not a pressing issue. Jonathan [name] was started on BuSpar in August 2022 for his anxiety, it has been helpful. Jonathan [name] is advised to be consistent with his PAP usage.  I renewed his CPAP supplies.  Jonathan [name] is advised to follow-up routinely to see Jonathan Givens, NP in 6 months, sooner if needed.  I answered all his questions today and Jonathan [name] was in agreement. I spent 30 minutes in total face-to-face time and in reviewing records during pre-charting, more than 50% of which was spent in counseling and coordination of care, reviewing test results, reviewing medications and treatment regimen and/or in discussing or reviewing Jonathan diagnosis of PD, OSA, Jonathan prognosis and treatment options. Pertinent laboratory and imaging test results that were available during this visit with Jonathan [name] were reviewed by me and considered in my medical decision making (see chart for details).

## 2022-09-27 ENCOUNTER — Other Ambulatory Visit: Payer: Self-pay | Admitting: Family Medicine

## 2022-09-27 ENCOUNTER — Other Ambulatory Visit: Payer: Self-pay | Admitting: Neurology

## 2022-10-03 ENCOUNTER — Ambulatory Visit (INDEPENDENT_AMBULATORY_CARE_PROVIDER_SITE_OTHER): Payer: Medicare HMO | Admitting: Family Medicine

## 2022-10-03 VITALS — BP 142/86 | HR 78 | Ht 65.5 in

## 2022-10-03 DIAGNOSIS — E291 Testicular hypofunction: Secondary | ICD-10-CM

## 2022-10-03 NOTE — Patient Instructions (Signed)
Schedule for 21 day nurse visit - testosterone injection.

## 2022-10-03 NOTE — Progress Notes (Signed)
Medical screening examination/treatment was performed by qualified clinical staff member and as supervising physician I was immediately available for consultation/collaboration. I have reviewed documentation and agree with assessment and plan.  Zaccai Chavarin, DO  

## 2022-10-03 NOTE — Progress Notes (Signed)
   Established Patient Office Visit  Subjective   Patient ID: Jonathan Yu, male    DOB: 10/25/1950  Age: 72 y.o. MRN: [id number]  Chief Complaint  Patient presents with   hypogonadism in male    Patient in office for nurse visit - testosterone injection    HPI  Hypogonadism in male- nurse visit testosterone injection. - patient denies, chest pain, dizziness, mood changes or any problems with medication.   ROS    Objective:     BP (!) 142/86   Pulse 78   Ht 5' 5.5" (1.664 m)   SpO2 95%   BMI 43.02 kg/m    Physical Exam   No results found for any visits on 10/03/22.    The 10-year ASCVD risk score (Arnett DK, et al., 2019) is: 42.8%    Assessment & Plan:  Testosterone injection - nurse visit - administered injection IM RUOQ . Patient tolerated injection without complications.  Problem List Items Addressed This Visit   None   Return in about 3 weeks (around 10/24/2022) for nurse visit - testosterone injection.    Rae Lips, LPN

## 2022-10-11 ENCOUNTER — Other Ambulatory Visit: Payer: Self-pay | Admitting: Neurology

## 2022-10-14 NOTE — Telephone Encounter (Signed)
Per Dr Rexene Alberts, it is up to the patient. He had discussed with Jinny Blossom last August, Rx was written for 3 mg TID but then patient felt stable and told Dr Rexene Alberts at last OV that he was still taking 2 mg TID.

## 2022-10-24 ENCOUNTER — Ambulatory Visit (INDEPENDENT_AMBULATORY_CARE_PROVIDER_SITE_OTHER): Payer: Medicare HMO | Admitting: Family Medicine

## 2022-10-24 VITALS — BP 134/74 | HR 83 | Ht 65.5 in | Wt 262.6 lb

## 2022-10-24 DIAGNOSIS — E291 Testicular hypofunction: Secondary | ICD-10-CM | POA: Diagnosis not present

## 2022-10-24 NOTE — Progress Notes (Signed)
   Subjective:    Patient ID: Jonathan Yu, male    DOB: [birth year], 72 y.o.   MRN: [id number]  HPI Pt is here today for Testosterone injection. He denies muscle cramps, problems with the medicine or changes in mood. He states that he will be starting Rybelsus 7 mg.    Review of Systems     Objective:   Physical Exam        Assessment & Plan:  Pt tolerated injection well. He has next injection already scheduled for 2 weeks.

## 2022-10-24 NOTE — Progress Notes (Signed)
Medical screening examination/treatment was performed by qualified clinical staff member and as supervising physician I was immediately available for consultation/collaboration. I have reviewed documentation and agree with assessment and plan.  Coline Calkin, DO  

## 2022-11-12 ENCOUNTER — Other Ambulatory Visit: Payer: Self-pay | Admitting: Family Medicine

## 2022-11-14 ENCOUNTER — Ambulatory Visit (INDEPENDENT_AMBULATORY_CARE_PROVIDER_SITE_OTHER): Payer: Medicare HMO | Admitting: Family Medicine

## 2022-11-14 VITALS — BP 132/82 | HR 79 | Ht 65.5 in

## 2022-11-14 DIAGNOSIS — E291 Testicular hypofunction: Secondary | ICD-10-CM

## 2022-11-14 NOTE — Progress Notes (Signed)
Medical screening examination/treatment was performed by qualified clinical staff member and as supervising physician I was immediately available for consultation/collaboration. I have reviewed documentation and agree with assessment and plan.  Caili Escalera, DO  

## 2022-11-14 NOTE — Patient Instructions (Signed)
Return in 21 days for nurse visit for testosterone injection.

## 2022-11-14 NOTE — Progress Notes (Signed)
   Established Patient Office Visit  Subjective   Patient ID: Jonathan Yu, male    DOB: Oct 01, 1950  Age: 72 y.o. MRN: [id number]  Chief Complaint  Patient presents with   hypogonadism in male    Testosterone injection - nurse visit.     HPI  Hypogonadism in male- testosterone injection- nurse visit. Patient denies  chest pain, palpitations,  dizziness or mood changes. Patient does states he has had shortness of breath with getting in and out of car as well as getting in /out of bed, increased stiffness that he relates to parkinson diagnosis. Patient c/o  headaches  about 2 to 3 days per week and wondering if related to increase in strength of Rybelsus.   ROS    Objective:     BP 132/82   Pulse 79   Ht 5' 5.5" (1.664 m)   SpO2 96%   BMI 43.03 kg/m    Physical Exam   No results found for any visits on 11/14/22.    The 10-year ASCVD risk score (Arnett DK, et al., 2019) is: 38.7%    Assessment & Plan:  Testosterone injection. Nurse visit. Cleared to continue with testosterone injection by Dr. Zigmund Daniel.  Admin 200mg  IM  LUOQ. Patient tolerated injection well without complications. Patient will return in 21 days for nurse visit for next testosterone injection. Per Dr. Zigmund Daniel regarding headache symptoms , he suggests giving the medication some time , increasing protein intake and staying well hydrated . If symptoms do not improve return to the office for evaluation. Problem List Items Addressed This Visit       Endocrine   Hypogonadism in male - Primary    Return in about 3 weeks (around 12/05/2022) for nurse visit for testosterone injection.Rae Lips, LPN

## 2022-12-05 ENCOUNTER — Ambulatory Visit (INDEPENDENT_AMBULATORY_CARE_PROVIDER_SITE_OTHER): Payer: Medicare HMO | Admitting: Family Medicine

## 2022-12-05 VITALS — BP 141/84 | HR 86 | Ht 64.5 in | Wt 263.4 lb

## 2022-12-05 DIAGNOSIS — E291 Testicular hypofunction: Secondary | ICD-10-CM

## 2022-12-05 NOTE — Progress Notes (Signed)
Medical screening examination/treatment was performed by qualified clinical staff member and as supervising physician I was immediately available for consultation/collaboration. I have reviewed documentation and agree with assessment and plan.  Maryanna Stuber, DO  

## 2022-12-05 NOTE — Patient Instructions (Signed)
Return in 21 days for next testosterone injection nurse visit.

## 2022-12-05 NOTE — Progress Notes (Signed)
   Established Patient Office Visit  Subjective   Patient ID: Jonathan Yu, male    DOB: 1951-06-12  Age: 72 y.o. MRN: [id number]  Chief Complaint  Patient presents with   hypogonadism in male    Testosterone injection nurse visit.    HPI  Hypogonadism in male- testosterone injection nurse visit.  Patient denies  chest pain, shortness of breath, dizziness, palpitations, mood changes or medication problems. Patient mentioned that he slid out of his home lift chair yesterday- no injury besides small abrasion on Right knee. He is feeling fine.  ROS    Objective:     BP (!) 141/84   Pulse 86   Ht 5' 4.5" (1.638 m)   Wt 263 lb 6.4 oz (119.5 kg) Comment: patient self reporting from home scale  SpO2 95%   BMI 44.51 kg/m    Physical Exam   No results found for any visits on 12/05/22.    The 10-year ASCVD risk score (Arnett DK, et al., 2019) is: 42.3%    Assessment & Plan:  Testosterone injection nurse visit. Admin  IM RUOQ. Patient tolerated injection well without complications.  Problem List Items Addressed This Visit       Endocrine   Hypogonadism in male - Primary    Return in about 3 weeks (around 12/26/2022) for nurse visit testosterone injection.Elizabeth Palau, LPN

## 2022-12-06 ENCOUNTER — Other Ambulatory Visit: Payer: Self-pay | Admitting: Sports Medicine

## 2022-12-06 DIAGNOSIS — Z96661 Presence of right artificial ankle joint: Secondary | ICD-10-CM

## 2022-12-11 ENCOUNTER — Telehealth: Payer: Self-pay | Admitting: Family Medicine

## 2022-12-11 DIAGNOSIS — Z96661 Presence of right artificial ankle joint: Secondary | ICD-10-CM

## 2022-12-11 MED ORDER — TRAMADOL HCL 50 MG PO TABS
ORAL_TABLET | ORAL | 0 refills | Status: DC
Start: 2022-12-11 — End: 2023-03-24

## 2022-12-11 NOTE — Telephone Encounter (Signed)
Done

## 2022-12-11 NOTE — Telephone Encounter (Signed)
traMADol (ULTRAM) 50 MG tablet  Pt states that he is out of Tramadol you gave him for feet and he needs a escript sent to [facility] on [address],and he verified with Pharmacy they take escripts for this

## 2022-12-26 ENCOUNTER — Ambulatory Visit: Payer: Medicare HMO

## 2022-12-26 ENCOUNTER — Ambulatory Visit (INDEPENDENT_AMBULATORY_CARE_PROVIDER_SITE_OTHER): Payer: Medicare HMO | Admitting: Family Medicine

## 2022-12-26 ENCOUNTER — Encounter: Payer: Self-pay | Admitting: Family Medicine

## 2022-12-26 VITALS — BP 108/70 | HR 95 | Ht 64.5 in | Wt 258.6 lb

## 2022-12-26 DIAGNOSIS — L729 Follicular cyst of the skin and subcutaneous tissue, unspecified: Secondary | ICD-10-CM | POA: Insufficient documentation

## 2022-12-26 DIAGNOSIS — R972 Elevated prostate specific antigen [PSA]: Secondary | ICD-10-CM

## 2022-12-26 DIAGNOSIS — E291 Testicular hypofunction: Secondary | ICD-10-CM

## 2022-12-26 DIAGNOSIS — E119 Type 2 diabetes mellitus without complications: Secondary | ICD-10-CM

## 2022-12-26 DIAGNOSIS — R350 Frequency of micturition: Secondary | ICD-10-CM | POA: Diagnosis not present

## 2022-12-26 DIAGNOSIS — I10 Essential (primary) hypertension: Secondary | ICD-10-CM | POA: Diagnosis not present

## 2022-12-26 DIAGNOSIS — N400 Enlarged prostate without lower urinary tract symptoms: Secondary | ICD-10-CM

## 2022-12-26 DIAGNOSIS — R17 Unspecified jaundice: Secondary | ICD-10-CM | POA: Diagnosis not present

## 2022-12-26 LAB — POCT GLYCOSYLATED HEMOGLOBIN (HGB A1C): HbA1c, POC (controlled diabetic range): 8.1 % — AB (ref 0.0–7.0)

## 2022-12-26 NOTE — Assessment & Plan Note (Signed)
Chronic, likely benign.  Update hepatic panel.

## 2022-12-26 NOTE — Assessment & Plan Note (Signed)
He would like to have this excised.  Referral placed to dermatology.

## 2022-12-26 NOTE — Assessment & Plan Note (Signed)
PSA levels were elevated previously.  Holding testosterone for now.

## 2022-12-26 NOTE — Progress Notes (Signed)
Jonathan Yu - 72 y.o. male MRN [id number]  Date of birth: [birth year]  Subjective Chief Complaint  Patient presents with   Diabetes    HPI Jonathan Yu is a 72 year old male here today for follow-up visit.  He continues to have a cyst on his mid back that is bothering him.  Not really having pain but would like to have this excised.  Started on Rybelsus previously.  He reports he has not really felt any different with this.  His A1c is unchanged at 8.1%.  He has not really worked on any dietary changes.  No side effects with Rybelsus at current strength.  It is pretty costly for him.  PSA elevated previously.  Recommend that he return to have this rechecked however he has not had this completed yet.  He remains on testosterone as well as Arimidex for elevated estradiol levels.  He is having some urinary frequency.  ROS:  A comprehensive ROS was completed and negative except as noted per HPI  Allergies  Allergen Reactions   Sesame Oil Hives and Shortness Of Breath   Clonazepam Hives   Tape Other (See Comments)    White surgical tape caused some blisters.   Percocet [Oxycodone-Acetaminophen] Rash   Soy Allergy Hives   Topamax [Topiramate]     Cognitive decline   Vicodin [Hydrocodone-Acetaminophen] Rash    Past Medical History:  Diagnosis Date   Allergy    DDD (degenerative disc disease), lumbar    Hyperlipidemia    "no meds since losing 106# 2 yr ago" (10/06/2013)   Hypertension 08/12/2010   "no meds since losing 106# 2 yr ago" (10/06/2013)   OSA on CPAP    "don't wear mask much since losing 106# 2 yr ago" (10/06/2013)   Parkinson's disease    Type II diabetes mellitus (HCC)    "no meds since losing 106# 2 yr ago" (10/06/2013)   Walking pneumonia 08/12/1966    Past Surgical History:  Procedure Laterality Date   ANKLE FRACTURE SURGERY Left 1973   ANKLE RECONSTRUCTION Right 05/2011   CARPAL TUNNEL RELEASE Bilateral 2001   HARDWARE REMOVAL Right 10/07/2013   Procedure: RIGHT  ANKLE HARDWARE REMOVAL;  Surgeon: Nadara Mustard, MD;  Location: [facility] OR;  Service: Orthopedics;  Laterality: Right;   I & D EXTREMITY Right 11/18/2013   Procedure: IRRIGATION AND DEBRIDEMENT EXTREMITY;  Surgeon: Nadara Mustard, MD;  Location: [facility] OR;  Service: Orthopedics;  Laterality: Right;  Irrigation and Debridement Right Fibula , Place Antibiotic Beads and VAC   NASAL SEPTUM SURGERY  1982   ORIF ANKLE FRACTURE Right 09/08/2013   Procedure: REPAIR SYNDESMOSIS DISRUPTION RIGHT ANKLE;  Surgeon: Nadara Mustard, MD;  Location: [facility] OR;  Service: Orthopedics;  Laterality: Right;   SHOULDER ARTHROSCOPY W/ ROTATOR CUFF REPAIR Right 2006; 2009   TONSILLECTOMY  1959   TOTAL ANKLE ARTHROPLASTY Right 08/18/2013   Procedure: TOTAL ANKLE ARTHOPLASTY;  Surgeon: Nadara Mustard, MD;  Location: [facility] OR;  Service: Orthopedics;  Laterality: Right;  Right Total Ankle Arthroplasty, Revision Fibular Fracture   URETERAL STENT PLACEMENT  ~ 2010 X 2    Social History   Socioeconomic History   Marital status: Divorced    Spouse name: Not on file   Number of children: 6   Years of education: 14   Highest education level: Some college, no degree  Occupational History   Occupation: sales/ DJ    Comment: part time  Tobacco Use   Smoking status: Some Days  Types: Pipe, Cigars   Smokeless tobacco: Never   Tobacco comments:    cigar 1-2 times/mont  Vaping Use   Vaping Use: Never used  Substance and Sexual Activity   Alcohol use: Yes    Comment: 1-2 drinks per year.   Drug use: No   Sexual activity: Yes  Other Topics Concern   Not on file  Social History Narrative   Lives with his son and his daughter. He does Hotel manager, enjoys singing, music and yard work (when he can).    Social Determinants of Health   Financial Resource Strain: Low Risk  (05/03/2022)   Overall Financial Resource Strain (CARDIA)    Difficulty of Paying Living Expenses: Not hard at all  Food Insecurity: No Food Insecurity (05/03/2022)   Hunger  Vital Sign    Worried About Running Out of Food in the Last Year: Never true    Ran Out of Food in the Last Year: Never true  Transportation Needs: No Transportation Needs (05/03/2022)   PRAPARE - Administrator, Civil Service (Medical): No    Lack of Transportation (Non-Medical): No  Physical Activity: Insufficiently Active (05/03/2022)   Exercise Vital Sign    Days of Exercise per Week: 1 day    Minutes of Exercise per Session: 60 min  Stress: No Stress Concern Present (05/03/2022)   [employer] of Occupational Health - Occupational Stress Questionnaire    Feeling of Stress : Not at all  Social Connections: Moderately Isolated (05/03/2022)   Social Connection and Isolation Panel [NHANES]    Frequency of Communication with Friends and Family: More than three times a week    Frequency of Social Gatherings with Friends and Family: More than three times a week    Attends Religious Services: More than 4 times per year    Active Member of Clubs or Organizations: No    Attends Banker Meetings: Never    Marital Status: Divorced    Family History  Problem Relation Age of Onset   Cancer Mother 45       Lung   Alzheimer's disease Father    Parkinson's disease Father    Scoliosis Sister    Cleft lip Sister     Health Maintenance  Topic Date Due   Diabetic kidney evaluation - Urine ACR  07/16/2013   COVID-19 Vaccine (3 - Pfizer risk series) 05/02/2023 (Originally 12/08/2019)   OPHTHALMOLOGY EXAM  01/11/2023   INFLUENZA VACCINE  03/13/2023   Medicare Annual Wellness (AWV)  05/04/2023   HEMOGLOBIN A1C  06/28/2023   Diabetic kidney evaluation - eGFR measurement  07/19/2023   FOOT EXAM  12/26/2023   COLONOSCOPY (Pts 45-63yrs Insurance coverage will need to be confirmed)  06/03/2029   DTaP/Tdap/Td (3 - Td or Tdap) 05/11/2031   Pneumonia Vaccine 13+ Years old  Completed   Hepatitis C Screening  Completed   Zoster Vaccines- Shingrix  Completed   HPV VACCINES   Aged Out     ----------------------------------------------------------------------------------------------------------------------------------------------------------------------------------------------------------------- Physical Exam BP 108/70 (BP Location: Left Arm, Patient Position: Sitting, Cuff Size: Large)   Pulse 95   Ht 5' 4.5" (1.638 m)   Wt 258 lb 9.6 oz (117.3 kg)   SpO2 94%   BMI 43.70 kg/m   Physical Exam Constitutional:      Appearance: Normal appearance.  HENT:     Head: Normocephalic and atraumatic.  Eyes:     General: No scleral icterus. Cardiovascular:     Rate and Rhythm: Normal rate  and regular rhythm.  Pulmonary:     Effort: Pulmonary effort is normal.     Breath sounds: Normal breath sounds.  Skin:    Comments: Approximately 2 cm cyst in the lower back.  Does not appear inflamed.  Neurological:     Mental Status: He is alert.  Psychiatric:        Mood and Affect: Mood normal.        Behavior: Behavior normal.     ------------------------------------------------------------------------------------------------------------------------------------------------------------------------------------------------------------------- Assessment and Plan  Essential hypertension, benign Blood pressure is well-controlled at this time.  Will continue current medications for management of hypertension.  Hypogonadism in male PSA levels were elevated previously.  Holding testosterone for now.  Type 2 diabetes mellitus without complication, without long-term current use of insulin (HCC) Diabetes is not well-controlled.  He would like to continue Rybelsus as work on dietary changes.  We did discuss changing to metformin with glipizide since Rybelsus is pretty costly for him.  BPH (benign prostatic hyperplasia) PSA levels elevated previously rechecking today.  He does have some urinary frequency will check urinalysis.  Elevated bilirubin Chronic, likely benign.   Update hepatic panel.  Cyst of skin He would like to have this excised.  Referral placed to dermatology.   No orders of the defined types were placed in this encounter.   No follow-ups on file.    This visit occurred during the SARS-CoV-2 public health emergency.  Safety protocols were in place, including screening questions prior to the visit, additional usage of staff PPE, and extensive cleaning of exam room while observing appropriate contact time as indicated for disinfecting solutions.

## 2022-12-26 NOTE — Assessment & Plan Note (Signed)
PSA levels elevated previously rechecking today.  He does have some urinary frequency will check urinalysis.

## 2022-12-26 NOTE — Assessment & Plan Note (Signed)
Blood pressure is well-controlled at this time.  Will continue current medications for management of hypertension.

## 2022-12-26 NOTE — Assessment & Plan Note (Signed)
Diabetes is not well-controlled.  He would like to continue Rybelsus as work on dietary changes.  We did discuss changing to metformin with glipizide since Rybelsus is pretty costly for him.

## 2022-12-27 LAB — MICROSCOPIC MESSAGE

## 2022-12-28 LAB — URINE CULTURE
MICRO NUMBER:: 14966950
Result:: NO GROWTH
SPECIMEN QUALITY:: ADEQUATE

## 2022-12-28 LAB — MICROALBUMIN / CREATININE URINE RATIO
Creatinine, Urine: 344 mg/dL — ABNORMAL HIGH (ref 20–320)
Microalb Creat Ratio: 57 mg/g creat — ABNORMAL HIGH (ref <30)
Microalb, Ur: 19.6 mg/dL

## 2022-12-28 LAB — URINALYSIS, ROUTINE W REFLEX MICROSCOPIC
Bacteria, UA: NONE SEEN /HPF
Bilirubin Urine: NEGATIVE
Glucose, UA: NEGATIVE
Hgb urine dipstick: NEGATIVE
Leukocytes,Ua: NEGATIVE
Nitrite: NEGATIVE
RBC / HPF: NONE SEEN /HPF (ref 0–2)
Specific Gravity, Urine: 1.023 (ref 1.001–1.035)
Squamous Epithelial / HPF: NONE SEEN /HPF (ref <=5)
WBC, UA: NONE SEEN /HPF (ref 0–5)
pH: 5.5 (ref 5.0–8.0)

## 2022-12-28 LAB — HEPATIC FUNCTION PANEL
AG Ratio: 1.7 (calc) (ref 1.0–2.5)
ALT: 33 U/L (ref 9–46)
AST: 20 U/L (ref 10–35)
Albumin: 4.2 g/dL (ref 3.6–5.1)
Alkaline phosphatase (APISO): 73 U/L (ref 35–144)
Bilirubin, Direct: 0.4 mg/dL — ABNORMAL HIGH (ref 0.0–0.2)
Globulin: 2.5 g/dL (calc) (ref 1.9–3.7)
Indirect Bilirubin: 2 mg/dL (calc) — ABNORMAL HIGH (ref 0.2–1.2)
Total Bilirubin: 2.4 mg/dL — ABNORMAL HIGH (ref 0.2–1.2)
Total Protein: 6.7 g/dL (ref 6.1–8.1)

## 2022-12-28 LAB — PSA, TOTAL AND FREE
PSA, % Free: 29 % (calc) (ref >25)
PSA, Free: 1.1 ng/mL
PSA, Total: 3.8 ng/mL (ref <=4.0)

## 2023-01-01 ENCOUNTER — Encounter: Payer: Self-pay | Admitting: Family Medicine

## 2023-01-02 ENCOUNTER — Encounter: Payer: Self-pay | Admitting: Neurology

## 2023-01-02 ENCOUNTER — Other Ambulatory Visit: Payer: Self-pay | Admitting: Family Medicine

## 2023-01-02 DIAGNOSIS — N400 Enlarged prostate without lower urinary tract symptoms: Secondary | ICD-10-CM

## 2023-01-02 MED ORDER — TAMSULOSIN HCL 0.4 MG PO CAPS: 0.8000 mg | ORAL_CAPSULE | Freq: Every day | ORAL | 3 refills | Status: DC

## 2023-01-02 MED ORDER — GLIPIZIDE 5 MG PO TABS: 5.0000 mg | ORAL_TABLET | Freq: Two times a day (BID) | ORAL | 3 refills | Status: DC

## 2023-01-02 MED ORDER — ANASTROZOLE 1 MG PO TABS: 1.0000 mg | ORAL_TABLET | ORAL | 1 refills | Status: DC

## 2023-01-02 MED ORDER — METFORMIN HCL ER 500 MG PO TB24: 500.0000 mg | ORAL_TABLET | Freq: Two times a day (BID) | ORAL | 1 refills | Status: DC

## 2023-01-23 DIAGNOSIS — L720 Epidermal cyst: Secondary | ICD-10-CM | POA: Diagnosis not present

## 2023-01-23 DIAGNOSIS — L309 Dermatitis, unspecified: Secondary | ICD-10-CM | POA: Diagnosis not present

## 2023-01-23 DIAGNOSIS — L408 Other psoriasis: Secondary | ICD-10-CM | POA: Diagnosis not present

## 2023-01-23 DIAGNOSIS — L821 Other seborrheic keratosis: Secondary | ICD-10-CM | POA: Diagnosis not present

## 2023-01-25 ENCOUNTER — Other Ambulatory Visit: Payer: Self-pay | Admitting: Family Medicine

## 2023-01-29 DIAGNOSIS — H5213 Myopia, bilateral: Secondary | ICD-10-CM | POA: Diagnosis not present

## 2023-01-29 DIAGNOSIS — H52223 Regular astigmatism, bilateral: Secondary | ICD-10-CM | POA: Diagnosis not present

## 2023-01-29 DIAGNOSIS — H16203 Unspecified keratoconjunctivitis, bilateral: Secondary | ICD-10-CM | POA: Diagnosis not present

## 2023-01-29 DIAGNOSIS — H25813 Combined forms of age-related cataract, bilateral: Secondary | ICD-10-CM | POA: Diagnosis not present

## 2023-02-03 DIAGNOSIS — L309 Dermatitis, unspecified: Secondary | ICD-10-CM | POA: Diagnosis not present

## 2023-02-03 DIAGNOSIS — L400 Psoriasis vulgaris: Secondary | ICD-10-CM | POA: Diagnosis not present

## 2023-02-04 DIAGNOSIS — Z1211 Encounter for screening for malignant neoplasm of colon: Secondary | ICD-10-CM | POA: Diagnosis not present

## 2023-02-04 DIAGNOSIS — G4730 Sleep apnea, unspecified: Secondary | ICD-10-CM | POA: Diagnosis not present

## 2023-02-04 DIAGNOSIS — K5904 Chronic idiopathic constipation: Secondary | ICD-10-CM | POA: Diagnosis not present

## 2023-02-10 ENCOUNTER — Ambulatory Visit (INDEPENDENT_AMBULATORY_CARE_PROVIDER_SITE_OTHER): Payer: Medicare HMO | Admitting: Sports Medicine

## 2023-02-10 ENCOUNTER — Other Ambulatory Visit (INDEPENDENT_AMBULATORY_CARE_PROVIDER_SITE_OTHER): Payer: Medicare HMO

## 2023-02-10 ENCOUNTER — Other Ambulatory Visit: Payer: Self-pay

## 2023-02-10 ENCOUNTER — Ambulatory Visit: Payer: Medicare HMO

## 2023-02-10 DIAGNOSIS — E291 Testicular hypofunction: Secondary | ICD-10-CM

## 2023-02-10 DIAGNOSIS — Z96661 Presence of right artificial ankle joint: Secondary | ICD-10-CM

## 2023-02-10 DIAGNOSIS — M25571 Pain in right ankle and joints of right foot: Secondary | ICD-10-CM | POA: Diagnosis not present

## 2023-02-10 DIAGNOSIS — M19022 Primary osteoarthritis, left elbow: Secondary | ICD-10-CM | POA: Diagnosis not present

## 2023-02-10 DIAGNOSIS — Z471 Aftercare following joint replacement surgery: Secondary | ICD-10-CM | POA: Diagnosis not present

## 2023-02-10 DIAGNOSIS — M19071 Primary osteoarthritis, right ankle and foot: Secondary | ICD-10-CM | POA: Diagnosis not present

## 2023-02-10 MED ORDER — TESTOSTERONE CYPIONATE 200 MG/ML IM SOLN
200.0000 mg | Freq: Once | INTRAMUSCULAR | Status: AC
Start: 2023-02-10 — End: 2023-02-10
  Administered 2023-02-10: 200 mg via INTRAMUSCULAR

## 2023-02-10 NOTE — Progress Notes (Signed)
    Procedures performed today:    Procedure: Real-time Ultrasound Guided injection of the left ankle joint Device: Samsung HS60  Verbal informed consent obtained.  Time-out conducted.  Noted no overlying erythema, induration, or other signs of local infection.  Skin prepped in a sterile fashion.  Local anesthesia: Topical Ethyl chloride.  With sterile technique and under real time ultrasound guidance: Noted arthritic joint, 1 cc Kenalog 40, 1 cc lidocaine, 1 cc bupivacaine injected easily Completed without difficulty  Advised to call if fevers/chills, erythema, induration, drainage, or persistent bleeding.  Images permanently stored and available for review in PACS.  Impression: Technically successful ultrasound guided injection.  Procedure: Real-time Ultrasound Guided injection of the left elbow joint Device: Samsung HS60  Verbal informed consent obtained.  Time-out conducted.  Noted no overlying erythema, induration, or other signs of local infection.  Skin prepped in a sterile fashion.  Local anesthesia: Topical Ethyl chloride.  With sterile technique and under real time ultrasound guidance: Arthritic joint noted, 1 cc Kenalog 40, 1 cc lidocaine, 1 cc bupivacaine injected easily Completed without difficulty  Advised to call if fevers/chills, erythema, induration, drainage, or persistent bleeding.  Images permanently stored and available for review in PACS.  Impression: Technically successful ultrasound guided injection.  Independent interpretation of notes and tests performed by another provider:   None.  Brief History, Exam, Impression, and Recommendations:    Primary osteoarthritis, left elbow Increasing pain left elbow worse with pronation and supination, he is not discretely tender over the distal biceps or the common extensor tendon origin, there is some discomfort over the anconeus muscle at the elbow joint. I would like left elbow x-rays, we will do an elbow joint  injection today, return to see me in 6 weeks.  Chronic bilateral ankle plain, right total ankle arthroplasty, left ankle posttraumatic osteoarthritis Increasing pain left worse than right, history of right ankle arthroplasty, this was done by Dr. Lajoyce Corners, I would like him to get some x-rays of the right ankle we will hold off on injections today due to risk of periprosthetic infection. If we do see signs of arthroplasty loosening we will refer him back to Dr. Lajoyce Corners, if not we will likely get a three-phase bone scan. I did inject his left ankle joint today.   Hypogonadism in male Testosterone shot given today, PSA levels were elevated into the low fours, they did return to normal recently. He needs to follow this up with PCP but most certainly needs repeat PSA test in a few months.     ____________________________________________ Jonathan Yu. Benjamin Stain, M.D., ABFM., CAQSM., AME. Primary Care and Sports Medicine [facility]  Adjunct Professor of Family Medicine  [employer]  Restaurant manager, fast food

## 2023-02-10 NOTE — Assessment & Plan Note (Signed)
Increasing pain left elbow worse with pronation and supination, he is not discretely tender over the distal biceps or the common extensor tendon origin, there is some discomfort over the anconeus muscle at the elbow joint. I would like left elbow x-rays, we will do an elbow joint injection today, return to see me in 6 weeks.

## 2023-02-10 NOTE — Addendum Note (Signed)
Addended by: Carren Rang A on: 02/10/2023 01:29 PM   Modules accepted: Orders

## 2023-02-10 NOTE — Assessment & Plan Note (Signed)
Increasing pain left worse than right, history of right ankle arthroplasty, this was done by Dr. Lajoyce Corners, I would like him to get some x-rays of the right ankle we will hold off on injections today due to risk of periprosthetic infection. If we do see signs of arthroplasty loosening we will refer him back to Dr. Lajoyce Corners, if not we will likely get a three-phase bone scan. I did inject his left ankle joint today.

## 2023-02-10 NOTE — Assessment & Plan Note (Signed)
Testosterone shot given today, PSA levels were elevated into the low fours, they did return to normal recently. He needs to follow this up with PCP but most certainly needs repeat PSA test in a few months.

## 2023-02-11 DIAGNOSIS — L720 Epidermal cyst: Secondary | ICD-10-CM | POA: Diagnosis not present

## 2023-02-25 DIAGNOSIS — L720 Epidermal cyst: Secondary | ICD-10-CM | POA: Diagnosis not present

## 2023-02-26 DIAGNOSIS — G20A1 Parkinson's disease without dyskinesia, without mention of fluctuations: Secondary | ICD-10-CM | POA: Diagnosis not present

## 2023-03-06 ENCOUNTER — Ambulatory Visit (INDEPENDENT_AMBULATORY_CARE_PROVIDER_SITE_OTHER): Payer: Medicare HMO | Admitting: Family Medicine

## 2023-03-06 VITALS — BP 146/68 | HR 84 | Ht 64.5 in | Wt 261.3 lb

## 2023-03-06 DIAGNOSIS — E291 Testicular hypofunction: Secondary | ICD-10-CM

## 2023-03-06 MED ORDER — TESTOSTERONE CYPIONATE 200 MG/ML IM SOLN
200.0000 mg | Freq: Once | INTRAMUSCULAR | Status: AC
Start: 2023-03-06 — End: 2023-03-06
  Administered 2023-03-06: 200 mg via INTRAMUSCULAR

## 2023-03-06 NOTE — Progress Notes (Signed)
Medical screening examination/treatment was performed by qualified clinical staff member and as supervising physician I was immediately available for consultation/collaboration. I have reviewed documentation and agree with assessment and plan.  Cody Matthews, DO  

## 2023-03-06 NOTE — Progress Notes (Signed)
   Established Patient Office Visit  Subjective   Patient ID: Hartman Minahan, male    DOB: January 13, 1951  Age: 72 y.o. MRN: [id number]  Chief Complaint  Patient presents with   hypogonadism in male    Testosterone injection nurse visit.     HPI  Hypogonadism in male - testosterone injection nurse visit. - patient states he has been a little shaky from parkinson  and has 8 # wight gain. He is seeing  neurologist  with [facility] for parkinsons  who has added levodoppa taking one per day for one week, then two per day for second week and then 3 per day for 3rd week. He was told to continue ropinirole. Patient was also told to schedule a follow up with Dr. Ashley Royalty as well.  Patient states had cyst removed from back 3 weeks ago. Patient has upcoming appt schld for colonoscopy for 04/29/2023.  His last DM eye exam was  done around 6 weeks ago at [facility] on [address]. ( I will request these records)   ROS    Objective:     BP (!) 146/68   Pulse 84   Ht 5' 4.5" (1.638 m)   Wt 261 lb 5 oz (118.5 kg)   SpO2 95%   BMI 44.16 kg/m    Physical Exam   No results found for any visits on 03/06/23.    The 10-year ASCVD risk score (Arnett DK, et al., 2019) is: 44.4%    Assessment & Plan:  Testosterone injection nurse visit.  Admin 200mg  IM LUOQ . Patient tolerated injection well without complications. Return in 21 days for nurse visit for testosterone injection.  Problem List Items Addressed This Visit       Endocrine   Hypogonadism in male - Primary   Relevant Medications   testosterone cypionate (DEPOTESTOSTERONE CYPIONATE) injection 200 mg (Start on 03/06/2023 10:30 AM)    Return in about 2 weeks (around 03/20/2023) for testosterone injeciton nurse visit. Elizabeth Palau, LPN

## 2023-03-06 NOTE — Patient Instructions (Signed)
Return in 21 days for nurse visit for testosterone injection.

## 2023-03-12 ENCOUNTER — Other Ambulatory Visit: Payer: Self-pay

## 2023-03-12 ENCOUNTER — Emergency Department (HOSPITAL_BASED_OUTPATIENT_CLINIC_OR_DEPARTMENT_OTHER): Payer: Medicare HMO

## 2023-03-12 ENCOUNTER — Inpatient Hospital Stay (HOSPITAL_BASED_OUTPATIENT_CLINIC_OR_DEPARTMENT_OTHER)
Admission: EM | Admit: 2023-03-12 | Discharge: 2023-03-24 | DRG: 418 | Disposition: A | Discharge status: Home / Self Care | Payer: Medicare HMO | Attending: Internal Medicine | Admitting: Internal Medicine

## 2023-03-12 ENCOUNTER — Encounter (HOSPITAL_BASED_OUTPATIENT_CLINIC_OR_DEPARTMENT_OTHER): Payer: Self-pay | Admitting: Emergency Medicine

## 2023-03-12 DIAGNOSIS — K269 Duodenal ulcer, unspecified as acute or chronic, without hemorrhage or perforation: Secondary | ICD-10-CM | POA: Diagnosis present

## 2023-03-12 DIAGNOSIS — Z79899 Other long term (current) drug therapy: Secondary | ICD-10-CM

## 2023-03-12 DIAGNOSIS — F17290 Nicotine dependence, other tobacco product, uncomplicated: Secondary | ICD-10-CM | POA: Diagnosis present

## 2023-03-12 DIAGNOSIS — Z6841 Body Mass Index (BMI) 40.0 and over, adult: Secondary | ICD-10-CM | POA: Diagnosis not present

## 2023-03-12 DIAGNOSIS — N400 Enlarged prostate without lower urinary tract symptoms: Secondary | ICD-10-CM | POA: Diagnosis present

## 2023-03-12 DIAGNOSIS — I87323 Chronic venous hypertension (idiopathic) with inflammation of bilateral lower extremity: Secondary | ICD-10-CM | POA: Diagnosis present

## 2023-03-12 DIAGNOSIS — Z9109 Other allergy status, other than to drugs and biological substances: Secondary | ICD-10-CM

## 2023-03-12 DIAGNOSIS — K8012 Calculus of gallbladder with acute and chronic cholecystitis without obstruction: Secondary | ICD-10-CM | POA: Diagnosis not present

## 2023-03-12 DIAGNOSIS — K828 Other specified diseases of gallbladder: Secondary | ICD-10-CM | POA: Diagnosis not present

## 2023-03-12 DIAGNOSIS — Z96661 Presence of right artificial ankle joint: Secondary | ICD-10-CM

## 2023-03-12 DIAGNOSIS — K660 Peritoneal adhesions (postprocedural) (postinfection): Secondary | ICD-10-CM | POA: Diagnosis present

## 2023-03-12 DIAGNOSIS — K9189 Other postprocedural complications and disorders of digestive system: Secondary | ICD-10-CM | POA: Diagnosis not present

## 2023-03-12 DIAGNOSIS — Z820 Family history of epilepsy and other diseases of the nervous system: Secondary | ICD-10-CM | POA: Diagnosis not present

## 2023-03-12 DIAGNOSIS — R933 Abnormal findings on diagnostic imaging of other parts of digestive tract: Secondary | ICD-10-CM | POA: Diagnosis not present

## 2023-03-12 DIAGNOSIS — K8000 Calculus of gallbladder with acute cholecystitis without obstruction: Secondary | ICD-10-CM | POA: Diagnosis not present

## 2023-03-12 DIAGNOSIS — G20A1 Parkinson's disease without dyskinesia, without mention of fluctuations: Secondary | ICD-10-CM | POA: Diagnosis present

## 2023-03-12 DIAGNOSIS — K760 Fatty (change of) liver, not elsewhere classified: Secondary | ICD-10-CM | POA: Diagnosis not present

## 2023-03-12 DIAGNOSIS — K819 Cholecystitis, unspecified: Principal | ICD-10-CM

## 2023-03-12 DIAGNOSIS — G20C Parkinsonism, unspecified: Secondary | ICD-10-CM | POA: Diagnosis present

## 2023-03-12 DIAGNOSIS — K5909 Other constipation: Secondary | ICD-10-CM | POA: Diagnosis present

## 2023-03-12 DIAGNOSIS — I10 Essential (primary) hypertension: Secondary | ICD-10-CM | POA: Diagnosis present

## 2023-03-12 DIAGNOSIS — K838 Other specified diseases of biliary tract: Secondary | ICD-10-CM | POA: Diagnosis not present

## 2023-03-12 DIAGNOSIS — N3289 Other specified disorders of bladder: Secondary | ICD-10-CM | POA: Diagnosis not present

## 2023-03-12 DIAGNOSIS — K82A1 Gangrene of gallbladder in cholecystitis: Secondary | ICD-10-CM | POA: Diagnosis not present

## 2023-03-12 DIAGNOSIS — G4733 Obstructive sleep apnea (adult) (pediatric): Secondary | ICD-10-CM | POA: Diagnosis not present

## 2023-03-12 DIAGNOSIS — E291 Testicular hypofunction: Secondary | ICD-10-CM | POA: Diagnosis present

## 2023-03-12 DIAGNOSIS — R1013 Epigastric pain: Secondary | ICD-10-CM | POA: Diagnosis not present

## 2023-03-12 DIAGNOSIS — R17 Unspecified jaundice: Secondary | ICD-10-CM | POA: Diagnosis present

## 2023-03-12 DIAGNOSIS — K219 Gastro-esophageal reflux disease without esophagitis: Secondary | ICD-10-CM | POA: Diagnosis present

## 2023-03-12 DIAGNOSIS — R1084 Generalized abdominal pain: Secondary | ICD-10-CM | POA: Diagnosis not present

## 2023-03-12 DIAGNOSIS — Z7984 Long term (current) use of oral hypoglycemic drugs: Secondary | ICD-10-CM | POA: Diagnosis not present

## 2023-03-12 DIAGNOSIS — K8020 Calculus of gallbladder without cholecystitis without obstruction: Secondary | ICD-10-CM | POA: Diagnosis not present

## 2023-03-12 DIAGNOSIS — Z91018 Allergy to other foods: Secondary | ICD-10-CM

## 2023-03-12 DIAGNOSIS — K810 Acute cholecystitis: Secondary | ICD-10-CM | POA: Diagnosis not present

## 2023-03-12 DIAGNOSIS — Z0189 Encounter for other specified special examinations: Secondary | ICD-10-CM | POA: Diagnosis not present

## 2023-03-12 DIAGNOSIS — K812 Acute cholecystitis with chronic cholecystitis: Secondary | ICD-10-CM | POA: Diagnosis not present

## 2023-03-12 DIAGNOSIS — K659 Peritonitis, unspecified: Secondary | ICD-10-CM | POA: Diagnosis not present

## 2023-03-12 DIAGNOSIS — M2550 Pain in unspecified joint: Secondary | ICD-10-CM | POA: Diagnosis present

## 2023-03-12 DIAGNOSIS — N200 Calculus of kidney: Secondary | ICD-10-CM | POA: Diagnosis not present

## 2023-03-12 DIAGNOSIS — R945 Abnormal results of liver function studies: Secondary | ICD-10-CM | POA: Diagnosis not present

## 2023-03-12 DIAGNOSIS — Z79811 Long term (current) use of aromatase inhibitors: Secondary | ICD-10-CM

## 2023-03-12 DIAGNOSIS — R112 Nausea with vomiting, unspecified: Secondary | ICD-10-CM | POA: Diagnosis not present

## 2023-03-12 DIAGNOSIS — N529 Male erectile dysfunction, unspecified: Secondary | ICD-10-CM | POA: Diagnosis present

## 2023-03-12 DIAGNOSIS — L209 Atopic dermatitis, unspecified: Secondary | ICD-10-CM | POA: Diagnosis present

## 2023-03-12 DIAGNOSIS — R600 Localized edema: Secondary | ICD-10-CM | POA: Diagnosis present

## 2023-03-12 DIAGNOSIS — L719 Rosacea, unspecified: Secondary | ICD-10-CM | POA: Diagnosis present

## 2023-03-12 DIAGNOSIS — Z885 Allergy status to narcotic agent status: Secondary | ICD-10-CM

## 2023-03-12 DIAGNOSIS — M5416 Radiculopathy, lumbar region: Secondary | ICD-10-CM | POA: Diagnosis present

## 2023-03-12 DIAGNOSIS — G4700 Insomnia, unspecified: Secondary | ICD-10-CM | POA: Diagnosis present

## 2023-03-12 DIAGNOSIS — K4020 Bilateral inguinal hernia, without obstruction or gangrene, not specified as recurrent: Secondary | ICD-10-CM | POA: Diagnosis not present

## 2023-03-12 DIAGNOSIS — R188 Other ascites: Secondary | ICD-10-CM | POA: Diagnosis not present

## 2023-03-12 DIAGNOSIS — Z888 Allergy status to other drugs, medicaments and biological substances status: Secondary | ICD-10-CM

## 2023-03-12 DIAGNOSIS — E119 Type 2 diabetes mellitus without complications: Secondary | ICD-10-CM | POA: Diagnosis not present

## 2023-03-12 DIAGNOSIS — R109 Unspecified abdominal pain: Secondary | ICD-10-CM | POA: Diagnosis not present

## 2023-03-12 DIAGNOSIS — E785 Hyperlipidemia, unspecified: Secondary | ICD-10-CM | POA: Diagnosis present

## 2023-03-12 DIAGNOSIS — R1011 Right upper quadrant pain: Secondary | ICD-10-CM | POA: Diagnosis not present

## 2023-03-12 LAB — COMPREHENSIVE METABOLIC PANEL
ALT: 11 U/L (ref 0–44)
AST: 23 U/L (ref 15–41)
Albumin: 3.6 g/dL (ref 3.5–5.0)
Alkaline Phosphatase: 84 U/L (ref 38–126)
Anion gap: 9 (ref 5–15)
BUN: 12 mg/dL (ref 8–23)
CO2: 26 mmol/L (ref 22–32)
Calcium: 9.4 mg/dL (ref 8.9–10.3)
Chloride: 106 mmol/L (ref 98–111)
Creatinine, Ser: 0.98 mg/dL (ref 0.61–1.24)
GFR, Estimated: >60 mL/min (ref >60)
Glucose, Bld: 217 mg/dL — ABNORMAL HIGH (ref 70–99)
Potassium: 4.1 mmol/L (ref 3.5–5.1)
Sodium: 141 mmol/L (ref 135–145)
Total Bilirubin: 1.8 mg/dL — ABNORMAL HIGH (ref 0.3–1.2)
Total Protein: 6.8 g/dL (ref 6.5–8.1)

## 2023-03-12 LAB — URINALYSIS, ROUTINE W REFLEX MICROSCOPIC
Bilirubin Urine: NEGATIVE
Glucose, UA: 250 mg/dL — AB
Hgb urine dipstick: NEGATIVE
Ketones, ur: NEGATIVE mg/dL
Leukocytes,Ua: NEGATIVE
Nitrite: NEGATIVE
Protein, ur: NEGATIVE mg/dL
Specific Gravity, Urine: 1.025 (ref 1.005–1.030)
pH: 5.5 (ref 5.0–8.0)

## 2023-03-12 LAB — TROPONIN I (HIGH SENSITIVITY): Troponin I (High Sensitivity): 5 ng/L (ref <18)

## 2023-03-12 LAB — CBC
HCT: 43.9 % (ref 39.0–52.0)
Hemoglobin: 14.8 g/dL (ref 13.0–17.0)
MCH: 29.6 pg (ref 26.0–34.0)
MCHC: 33.7 g/dL (ref 30.0–36.0)
MCV: 87.8 fL (ref 80.0–100.0)
Platelets: 167 10*3/uL (ref 150–400)
RBC: 5 MIL/uL (ref 4.22–5.81)
RDW: 13.2 % (ref 11.5–15.5)
WBC: 7.6 10*3/uL (ref 4.0–10.5)
nRBC: 0 % (ref 0.0–0.2)

## 2023-03-12 LAB — GLUCOSE, CAPILLARY: Glucose-Capillary: 138 mg/dL — ABNORMAL HIGH (ref 70–99)

## 2023-03-12 LAB — LIPASE, BLOOD: Lipase: 46 U/L (ref 11–51)

## 2023-03-12 MED ORDER — PIPERACILLIN-TAZOBACTAM 3.375 G IVPB 30 MIN
3.3750 g | Freq: Once | INTRAVENOUS | Status: AC
  Administered 2023-03-12: 3.375 g via INTRAVENOUS
  Filled 2023-03-12: qty 50

## 2023-03-12 MED ORDER — SODIUM CHLORIDE 0.9 % IV SOLN: INTRAVENOUS | Status: DC | PRN

## 2023-03-12 MED ORDER — HYDROMORPHONE HCL 1 MG/ML IJ SOLN
1.0000 mg | Freq: Once | INTRAMUSCULAR | Status: AC
  Administered 2023-03-12: 1 mg via INTRAVENOUS
  Filled 2023-03-12: qty 1

## 2023-03-12 MED ORDER — SODIUM CHLORIDE 0.9 % IV SOLN: INTRAVENOUS | Status: DC

## 2023-03-12 MED ORDER — IOHEXOL 300 MG/ML  SOLN
100.0000 mL | Freq: Once | INTRAMUSCULAR | Status: AC | PRN
  Administered 2023-03-12: 100 mL via INTRAVENOUS

## 2023-03-12 MED ORDER — HYDROMORPHONE HCL 1 MG/ML IJ SOLN
0.5000 mg | INTRAMUSCULAR | Status: DC | PRN
  Administered 2023-03-12 – 2023-03-21 (×22): 1 mg via INTRAVENOUS
  Filled 2023-03-12 (×24): qty 1

## 2023-03-12 MED ORDER — ACETAMINOPHEN 325 MG PO TABS
650.0000 mg | ORAL_TABLET | Freq: Four times a day (QID) | ORAL | Status: DC | PRN
  Administered 2023-03-12: 650 mg via ORAL
  Filled 2023-03-12: qty 2

## 2023-03-12 MED ORDER — MORPHINE SULFATE (PF) 4 MG/ML IV SOLN
4.0000 mg | Freq: Once | INTRAVENOUS | Status: AC
  Administered 2023-03-12: 4 mg via INTRAVENOUS
  Filled 2023-03-12: qty 1

## 2023-03-12 MED ORDER — PIPERACILLIN-TAZOBACTAM 3.375 G IVPB
3.3750 g | Freq: Three times a day (TID) | INTRAVENOUS | Status: DC
  Administered 2023-03-12 – 2023-03-19 (×21): 3.375 g via INTRAVENOUS
  Filled 2023-03-12 (×20): qty 50

## 2023-03-12 MED ORDER — ONDANSETRON HCL 4 MG/2ML IJ SOLN
4.0000 mg | Freq: Once | INTRAMUSCULAR | Status: AC | PRN
  Administered 2023-03-12: 4 mg via INTRAVENOUS
  Filled 2023-03-12: qty 2

## 2023-03-12 MED ORDER — LACTATED RINGERS IV BOLUS
1000.0000 mL | Freq: Once | INTRAVENOUS | Status: AC
  Administered 2023-03-12: 1000 mL via INTRAVENOUS

## 2023-03-12 MED ORDER — INSULIN ASPART 100 UNIT/ML IJ SOLN
0.0000 [IU] | INTRAMUSCULAR | Status: DC
  Administered 2023-03-12 – 2023-03-13 (×3): 1 [IU] via SUBCUTANEOUS
  Administered 2023-03-14: 5 [IU] via SUBCUTANEOUS
  Administered 2023-03-15 (×3): 2 [IU] via SUBCUTANEOUS
  Administered 2023-03-15 (×2): 1 [IU] via SUBCUTANEOUS

## 2023-03-12 MED ORDER — ONDANSETRON HCL 4 MG/2ML IJ SOLN
4.0000 mg | Freq: Four times a day (QID) | INTRAMUSCULAR | Status: DC | PRN
  Administered 2023-03-17: 4 mg via INTRAVENOUS
  Filled 2023-03-12 (×2): qty 2

## 2023-03-12 MED ORDER — HYDROXYZINE HCL 25 MG PO TABS
25.0000 mg | ORAL_TABLET | Freq: Once | ORAL | Status: AC
  Administered 2023-03-12: 25 mg via ORAL
  Filled 2023-03-12: qty 1

## 2023-03-12 MED ORDER — CARBIDOPA-LEVODOPA ER 25-100 MG PO TBCR
1.0000 | EXTENDED_RELEASE_TABLET | Freq: Every day | ORAL | Status: DC
  Administered 2023-03-12 – 2023-03-24 (×12): 1 via ORAL
  Filled 2023-03-12 (×13): qty 1

## 2023-03-12 MED ORDER — CARBIDOPA-LEVODOPA 25-100 MG PO TABS: 1.0000 | ORAL_TABLET | Freq: Once | ORAL | Status: DC

## 2023-03-12 MED ORDER — ONDANSETRON HCL 4 MG PO TABS
4.0000 mg | ORAL_TABLET | Freq: Four times a day (QID) | ORAL | Status: DC | PRN
  Administered 2023-03-23: 4 mg via ORAL
  Filled 2023-03-12: qty 1

## 2023-03-12 NOTE — H&P (Addendum)
Jonathan Yu ZOX:[id number] DOB: 1950-12-30 DOA: 03/12/2023     PCP: Everrett Coombe, DO   Outpatient Specialists:    GI  Dr. Yevonne Pax [facility]     Patient arrived to ER on 03/12/23 at 0722 Referred by Attending Kirby Crigler, Mir Judie Petit, MD   Patient coming from:    home Lives With family    Chief Complaint:   Chief Complaint  Patient presents with   Abdominal Pain    HPI: Jonathan Yu is a 72 y.o. male with medical history significant of HLD, HTN OSA, DM2  parkinson disease, hypogonadism,   Presented with  abd pain  Had abd pain and nausea since last night pain became more severe around 2 AM No diarrhea no fever No CP  No BM today Pain started after eating chicken livers, at first he though it was food poisoning  Denies significant ETOH intake  Does not smoke  No results found for: "SARSCOV2NAA"      Regarding pertinent Chronic problems:     Hyperlipidemia -  on statins Lipitor (atorvastatin)  Lipid Panel     Component Value Date/Time   CHOL 131 07/18/2022 1333   TRIG 127 07/18/2022 1333   HDL 46 07/18/2022 1333   CHOLHDL 2.8 07/18/2022 1333   VLDL 41 (H) 06/11/2016 1059   LDLCALC 64 07/18/2022 1333     HTN on Cozaar        DM 2 -  Lab Results  Component Value Date   HGBA1C 8.1 (A) 12/26/2022   on  PO meds only,     Morbid obesity-   BMI Readings from Last 1 Encounters:  03/12/23 43.52 kg/m       OSA -  on CPAP    BPH - on Flomax,      While in ER:    Found to have Cholecystitis  General Surgery was made aware    Lab Orders         Lipase, blood         Comprehensive metabolic panel         CBC         Urinalysis, Routine w reflex microscopic -Urine, Clean Catch        [place]  Cholelithiasis with positive sonographic Murphy sign and suspected gallbladder wall thickening, concerning for acute cholecystitis.  CTabd/pelvis -  Findings highly concerning for acute cholecystitis. Correlate clinically to determine the need for additional  imaging with nuclear medicine HIDA scan or ultrasound. There is a 3.5 x 5 mm stone which is outside the gallbladder lumen and likely within the cystic duct. In appropriate clinical setting, findings are highly concerning for acute cholecystitis    Following Medications were ordered in ER: Medications  piperacillin-tazobactam (ZOSYN) IVPB 3.375 g (3.375 g Intravenous New Bag/Given 03/12/23 2027)  0.9 %  sodium chloride infusion ( Intravenous New Bag/Given 03/12/23 1250)  HYDROmorphone (DILAUDID) injection 0.5-1 mg (1 mg Intravenous Given 03/12/23 2026)  ondansetron (ZOFRAN) injection 4 mg (4 mg Intravenous Given 03/12/23 0811)  lactated ringers bolus 1,000 mL (0 mLs Intravenous Stopped 03/12/23 1002)  morphine (PF) 4 MG/ML injection 4 mg (4 mg Intravenous Given 03/12/23 0811)  iohexol (OMNIPAQUE) 300 MG/ML solution 100 mL (100 mLs Intravenous Contrast Given 03/12/23 0831)  HYDROmorphone (DILAUDID) injection 1 mg (1 mg Intravenous Given 03/12/23 0928)  HYDROmorphone (DILAUDID) injection 1 mg (1 mg Intravenous Given 03/12/23 1134)  piperacillin-tazobactam (ZOSYN) IVPB 3.375 g (0 g Intravenous Stopped 03/12/23 1325)  HYDROmorphone (DILAUDID) injection 1  mg (1 mg Intravenous Given 03/12/23 1458)  hydrOXYzine (ATARAX) tablet 25 mg (25 mg Oral Given 03/12/23 1709)    _______________________________________________________ ER Provider Called:  General Surgery   They Recommend admit to medicine   Will see in AM      ED Triage Vitals  Encounter Vitals Group     BP 03/12/23 0734 (!) 168/85     Systolic BP Percentile --      Diastolic BP Percentile --      Pulse Rate 03/12/23 0734 63     Resp 03/12/23 0734 20     Temp 03/12/23 0734 97.7 F (36.5 C)     Temp Source 03/12/23 1130 Oral     SpO2 03/12/23 0734 96 %     Weight 03/12/23 0738 261 lb 8 oz (118.6 kg)     Height 03/12/23 0738 5\' 5"  (1.651 m)     Head Circumference --      Peak Flow --      Pain Score 03/12/23 0738 8     Pain Loc --       Pain Education --      Exclude from Growth Chart --   ZOXW(96)@     _________________________________________ Significant initial  Findings: Abnormal Labs Reviewed  COMPREHENSIVE METABOLIC PANEL - Abnormal; Notable for the following components:      Result Value   Glucose, Bld 217 (*)    Total Bilirubin 1.8 (*)    All other components within normal limits  URINALYSIS, ROUTINE W REFLEX MICROSCOPIC - Abnormal; Notable for the following components:   Glucose, UA 250 (*)    All other components within normal limits      _________________________ Troponin   Cardiac Panel (last 3 results) Recent Labs    03/12/23 0748  TROPONINIHS 5     ECG: Ordered Personally reviewed and interpreted by me showing: HR : 67 Rhythm: Sinus rhythm Left axis deviation No significant change since last tracing QTC 429    The recent clinical data is shown below. Vitals:   03/12/23 1400 03/12/23 1430 03/12/23 1511 03/12/23 1801  BP: 128/80 (!) 137/99  (!) 145/85  Pulse: 68 89 85 77  Resp: (!) 27 20 20 15   Temp:   98.1 F (36.7 C) 98.2 F (36.8 C)  TempSrc:   Oral   SpO2: 97% 95% 96% 99%  Weight:      Height:        WBC     Component Value Date/Time   WBC 7.6 03/12/2023 0748   LYMPHSABS 2,054 07/18/2022 1333   MONOABS 0.5 07/31/2019 0949   EOSABS 107 07/18/2022 1333   BASOSABS 32 07/18/2022 1333       UA   no evidence of UTI      Urine analysis:    Component Value Date/Time   COLORURINE YELLOW 03/12/2023 0850   APPEARANCEUR CLEAR 03/12/2023 0850   LABSPEC 1.025 03/12/2023 0850   PHURINE 5.5 03/12/2023 0850   GLUCOSEU 250 (A) 03/12/2023 0850   HGBUR NEGATIVE 03/12/2023 0850   BILIRUBINUR NEGATIVE 03/12/2023 0850   KETONESUR NEGATIVE 03/12/2023 0850   PROTEINUR NEGATIVE 03/12/2023 0850   UROBILINOGEN 0.2 12/21/2010 1445   NITRITE NEGATIVE 03/12/2023 0850   LEUKOCYTESUR NEGATIVE 03/12/2023 0850    Results for orders placed or performed in visit on 12/26/22  Urine Culture      Status: None   Collection Time: 12/26/22 10:17 AM   Specimen: Urine  Result Value Ref Range Status   MICRO  NUMBER: [id number]  Final   SPECIMEN QUALITY: Adequate  Final   Sample Source URINE  Final   STATUS: FINAL  Final   Result: No Growth  Final  MICROSCOPIC MESSAGE     Status: None   Collection Time: 12/26/22 10:17 AM  Result Value Ref Range Status   Note   Final    Comment: This urine was analyzed for the presence of WBC,  RBC, bacteria, casts, and other formed elements.  Only those elements seen were reported. . Melodye Ped started Antibiotics Given (last 72 hours)     Date/Time Action Medication Dose Rate   03/12/23 1252 New Bag/Given   piperacillin-tazobactam (ZOSYN) IVPB 3.375 g 3.375 g 100 mL/hr   03/12/23 2027 New Bag/Given   piperacillin-tazobactam (ZOSYN) IVPB 3.375 g 3.375 g 12.5 mL/hr       No results found for the last 90 days.   __________________________________________________________ Recent Labs  Lab 03/12/23 0748  NA 141  K 4.1  CO2 26  GLUCOSE 217*  BUN 12  CREATININE 0.98  CALCIUM 9.4    Cr  stable,    Lab Results  Component Value Date   CREATININE 0.98 03/12/2023   CREATININE 0.88 07/18/2022   CREATININE 1.14 10/25/2021    Recent Labs  Lab 03/12/23 0748  AST 23  ALT 11  ALKPHOS 84  BILITOT 1.8*  PROT 6.8  ALBUMIN 3.6   Lab Results  Component Value Date   CALCIUM 9.4 03/12/2023       Plt: Lab Results  Component Value Date   PLT 167 03/12/2023       Recent Labs  Lab 03/12/23 0748  WBC 7.6  HGB 14.8  HCT 43.9  MCV 87.8  PLT 167    HG/HCT   stable,      Component Value Date/Time   HGB 14.8 03/12/2023 0748   HCT 43.9 03/12/2023 0748   MCV 87.8 03/12/2023 0748    Recent Labs  Lab 03/12/23 0748  LIPASE 46   ____________________________________________ Hospitalist was called for admission for acute cholesytestectomy   The following Work up has been ordered so far:  Orders Placed This Encounter   Procedures   CT ABDOMEN PELVIS W CONTRAST   US Abdomen Limited RUQ (LIVER/GB)   Lipase, blood   Comprehensive metabolic panel   CBC   Urinalysis, Routine w reflex microscopic -Urine, Clean Catch   Diet clear liquid Room service appropriate? Yes; Fluid consistency: Thin   Saline Lock IV, Maintain IV access   Consult to general surgery   Consult to hospitalist   ED EKG   EKG 12-Lead   Admit to Inpatient (patient's expected length of stay will be greater than 2 midnights or inpatient only procedure)     OTHER Significant initial  Findings:  labs showing:     DM  labs:  HbA1C: Recent Labs    07/18/22 1333 12/26/22 0958  HGBA1C 8.2* 8.1*    CBG (last 3)  No results for input(s): "GLUCAP" in the last 72 hours.     Cultures:    Component Value Date/Time   SDES WOUND RIGHT ANKLE 11/18/2013 1926   SDES WOUND RIGHT ANKLE 11/18/2013 1926   SPECREQUEST NONE 11/18/2013 1926   SPECREQUEST NONE 11/18/2013 1926   CULT  11/18/2013 1926    FEW STAPHYLOCOCCUS SPECIES (COAGULASE NEGATIVE) Performed at [facility]   CULT  11/18/2013 1926    NO ANAEROBES ISOLATED Performed at [facility]  REPTSTATUS 11/21/2013 FINAL 11/18/2013 1926   REPTSTATUS 11/23/2013 FINAL 11/18/2013 1926     Radiological Exams on Admission: US Abdomen Limited RUQ (LIVER/GB)  Result Date: 03/12/2023 CLINICAL DATA:  Right upper quadrant pain, nausea, and vomiting. Findings concerning for acute cholecystitis on CT. EXAM: ULTRASOUND ABDOMEN LIMITED RIGHT UPPER QUADRANT COMPARISON:  CT abdomen and pelvis 03/12/2023 FINDINGS: Gallbladder: Multiple stones in the gallbladder. The margins of the gallbladder wall are not particularly well-defined on this ultrasound, however gallbladder wall thickening is suspected when correlating with the earlier CT. A positive sonographic Eulah Pont sign was reported by the sonographer. Common bile duct: Diameter: 3 mm Liver: Diffusely increased parenchymal echogenicity  without a focal lesion identified. Portal vein is patent on color Doppler imaging with normal direction of blood flow towards the liver. Other: None. IMPRESSION: 1. Cholelithiasis with positive sonographic Murphy sign and suspected gallbladder wall thickening, concerning for acute cholecystitis. 2. No biliary dilatation. 3. Hepatic steatosis. Electronically Signed   By: Sebastian Ache M.D.   On: 03/12/2023 11:18   CT ABDOMEN PELVIS W CONTRAST  Result Date: 03/12/2023 CLINICAL DATA:  Abdominal pain, acute, nonlocalized eval for SBO vs cholecystitis EXAM: CT ABDOMEN AND PELVIS WITH CONTRAST TECHNIQUE: Multidetector CT imaging of the abdomen and pelvis was performed using the standard protocol following bolus administration of intravenous contrast. RADIATION DOSE REDUCTION: This exam was performed according to the departmental dose-optimization program which includes automated exposure control, adjustment of the mA and/or kV according to patient size and/or use of iterative reconstruction technique. CONTRAST:  OMNIPAQUE IOHEXOL 300 MG/ML  SOLN COMPARISON:  CT urogram from 09/28/2009. FINDINGS: Lower chest: There are patchy atelectatic changes in the visualized lung bases. No overt consolidation. No pleural effusion. The heart is normal in size. No pericardial effusion. Hepatobiliary: The liver is normal in size. Non-cirrhotic configuration. No suspicious mass. These is mild diffuse hepatic steatosis. No intrahepatic or extrahepatic bile duct dilation. Gallbladder is distended measuring up to 4.2 cm in width. There is mild pericholecystic fat stranding. There are multiple layering gallstones in the neck region. There is a 3.5 x 5 mm stone which is outside the gallbladder lumen and likely within the cystic duct. In appropriate clinical setting, findings are highly concerning for acute cholecystitis. Correlate clinically to determine the need for additional imaging with nuclear medicine HIDA scan or ultrasound.  Pancreas: Unremarkable. No pancreatic ductal dilatation or surrounding inflammatory changes. Spleen: Within normal limits. No focal lesion. Adrenals/Urinary Tract: Adrenal glands are unremarkable. No suspicious renal mass. No hydronephrosis. No renal or ureteric calculi. Unremarkable urinary bladder. Stomach/Bowel: No disproportionate dilation of the small or large bowel loops. No evidence of abnormal bowel wall thickening or inflammatory changes. The appendix is unremarkable. There are scattered diverticula throughout the colon, without imaging signs of diverticulitis. Vascular/Lymphatic: No ascites or pneumoperitoneum. No abdominal or pelvic lymphadenopathy, by size criteria. No aneurysmal dilation of the major abdominal arteries. There are mild peripheral atherosclerotic vascular calcifications of the aorta and its major branches. Reproductive: Enlarged prostate. Symmetric seminal vesicles. Other: There are bilateral small fat containing inguinal hernias. Partially seen intramuscular lipoma in the right proximal sartorius muscle. Musculoskeletal: No suspicious osseous lesions. There are mild multilevel degenerative changes in the visualized spine. IMPRESSION: *Findings highly concerning for acute cholecystitis. Correlate clinically to determine the need for additional imaging with nuclear medicine HIDA scan or ultrasound. *Multiple other nonacute observations, as described above. Electronically Signed   By: Jules Schick M.D.   On: 03/12/2023 09:20   _______________________________________________________________________________________________________ Latest  Blood pressure (!) 145/85, pulse 77, temperature 98.2 F (36.8 C), resp. rate 15, height 5\' 5"  (1.651 m), weight 118.6 kg, SpO2 99%.   Vitals  labs and radiology finding personally reviewed  Review of Systems:    Pertinent positives include:   abdominal pain, nausea  Constitutional:  No weight loss, night sweats, Fevers, chills, fatigue, weight  loss  HEENT:  No headaches, Difficulty swallowing,Tooth/dental problems,Sore throat,  No sneezing, itching, ear ache, nasal congestion, post nasal drip,  Cardio-vascular:  No chest pain, Orthopnea, PND, anasarca, dizziness, palpitations.no Bilateral lower extremity swelling  GI:  No heartburn, indigestion, , vomiting, diarrhea, change in bowel habits, loss of appetite, melena, blood in stool, hematemesis Resp:  no shortness of breath at rest. No dyspnea on exertion, No excess mucus, no productive cough, No non-productive cough, No coughing up of blood.No change in color of mucus.No wheezing. Skin:  no rash or lesions. No jaundice GU:  no dysuria, change in color of urine, no urgency or frequency. No straining to urinate.  No flank pain.  Musculoskeletal:  No joint pain or no joint swelling. No decreased range of motion. No back pain.  Psych:  No change in mood or affect. No depression or anxiety. No memory loss.  Neuro: no localizing neurological complaints, no tingling, no weakness, no double vision, no gait abnormality, no slurred speech, no confusion  All systems reviewed and apart from HOPI all are negative _______________________________________________________________________________________________ Past Medical History:   Past Medical History:  Diagnosis Date   Allergy    DDD (degenerative disc disease), lumbar    Hyperlipidemia    "no meds since losing 106# 2 yr ago" (10/06/2013)   Hypertension 08/12/2010   "no meds since losing 106# 2 yr ago" (10/06/2013)   OSA on CPAP    "don't wear mask much since losing 106# 2 yr ago" (10/06/2013)   Parkinson's disease    Type II diabetes mellitus (HCC)    "no meds since losing 106# 2 yr ago" (10/06/2013)   Walking pneumonia 08/12/1966      Past Surgical History:  Procedure Laterality Date   ANKLE FRACTURE SURGERY Left 1973   ANKLE RECONSTRUCTION Right 05/2011   CARPAL TUNNEL RELEASE Bilateral 2001   HARDWARE REMOVAL Right  10/07/2013   Procedure: RIGHT ANKLE HARDWARE REMOVAL;  Surgeon: Nadara Mustard, MD;  Location: [facility] OR;  Service: Orthopedics;  Laterality: Right;   I & D EXTREMITY Right 11/18/2013   Procedure: IRRIGATION AND DEBRIDEMENT EXTREMITY;  Surgeon: Nadara Mustard, MD;  Location: [facility] OR;  Service: Orthopedics;  Laterality: Right;  Irrigation and Debridement Right Fibula , Place Antibiotic Beads and VAC   NASAL SEPTUM SURGERY  1982   ORIF ANKLE FRACTURE Right 09/08/2013   Procedure: REPAIR SYNDESMOSIS DISRUPTION RIGHT ANKLE;  Surgeon: Nadara Mustard, MD;  Location: [facility] OR;  Service: Orthopedics;  Laterality: Right;   SHOULDER ARTHROSCOPY W/ ROTATOR CUFF REPAIR Right 2006; 2009   TONSILLECTOMY  1959   TOTAL ANKLE ARTHROPLASTY Right 08/18/2013   Procedure: TOTAL ANKLE ARTHOPLASTY;  Surgeon: Nadara Mustard, MD;  Location: [facility] OR;  Service: Orthopedics;  Laterality: Right;  Right Total Ankle Arthroplasty, Revision Fibular Fracture   URETERAL STENT PLACEMENT  ~ 2010 X 2    Social History:  Ambulatory   cane,       reports that he has been smoking pipe and cigars. He has never used smokeless tobacco. He reports current alcohol use. He reports that he does not use drugs.  Family History:   Family History  Problem Relation Age of Onset   Cancer Mother 103       Lung   Alzheimer's disease Father    Parkinson's disease Father    Scoliosis Sister    Cleft lip Sister    ______________________________________________________________________________________________ Allergies: Allergies  Allergen Reactions   Sesame Oil Hives and Shortness Of Breath   Clonazepam Hives   Tape Other (See Comments)    White surgical tape caused some blisters.   Percocet [Oxycodone-Acetaminophen] Rash   Soy Allergy Hives   Topamax [Topiramate] Other (See Comments)    Cognitive decline   Vicodin [Hydrocodone-Acetaminophen] Rash     Prior to Admission medications   Medication Sig Start Date End Date Taking? Authorizing Provider   albuterol (PROVENTIL HFA;VENTOLIN HFA) 108 (90 Base) MCG/ACT inhaler Inhale 2 puffs into the lungs every 6 (six) hours as needed for wheezing or shortness of breath. 07/31/17  Yes Rodolph Bong, MD  AMBULATORY NON FORMULARY MEDICATION Continue CPAP at current settings.  Please provide new supplies. [name] phone number [phone] 06/03/19  Yes Rodolph Bong, MD  AMBULATORY NON FORMULARY MEDICATION Compression stockings.  Vive size LGM Leg swelling. Disp 1 pair 99 refil. 06/09/19  Yes Rodolph Bong, MD  anastrozole (ARIMIDEX) 1 MG tablet Take 1 tablet (1 mg total) by mouth every Monday, Wednesday, and Friday. 01/03/23  Yes Everrett Coombe, DO  atorvastatin (LIPITOR) 40 MG tablet TAKE 1 TABLET EVERY DAY Patient taking differently: Take 40 mg by mouth daily. 09/27/22  Yes Everrett Coombe, DO  Azelaic Acid 15 % cream Apply 1 application topically 2 (two) times daily. Patient taking differently: Apply 1 application  topically 2 (two) times daily as needed (flares). 06/03/19  Yes Rodolph Bong, MD  busPIRone (BUSPAR) 5 MG tablet Take 1 tablet (5 mg total) by mouth 4 (four) times daily as needed. Patient taking differently: Take 5 mg by mouth 3 (three) times daily. 03/26/22  Yes Butch Penny, NP  Carbidopa-Levodopa ER (SINEMET CR) 25-100 MG tablet controlled release Take 1 tablet by mouth daily. Dose will increase by 1 tablet each week until taking 3 times daily 02/26/23 03/11/24 Yes [provider]  fluticasone (FLONASE) 50 MCG/ACT nasal spray Place 1 spray into both nostrils as needed for allergies.   Yes [provider]  furosemide (LASIX) 20 MG tablet TAKE 1 TABLET BY MOUTH ONCE DAILY AS NEEDED FOR EDEMA Patient taking differently: Take 20 mg by mouth as needed for edema or fluid. 03/12/22  Yes Everrett Coombe, DO  glipiZIDE (GLUCOTROL) 5 MG tablet Take 1 tablet (5 mg total) by mouth 2 (two) times daily before a meal. 01/02/23  Yes Everrett Coombe, DO  LINZESS 145 MCG CAPS  capsule Take 145 mcg by mouth daily. 02/05/23  Yes [provider]  loratadine (CLARITIN) 10 MG tablet Take 10 mg by mouth daily as needed for allergies.   Yes [provider]  losartan (COZAAR) 50 MG tablet TAKE 1 TABLET EVERY DAY Patient taking differently: Take 50 mg by mouth daily. 09/27/22  Yes Everrett Coombe, DO  metFORMIN (GLUCOPHAGE-XR) 500 MG 24 hr tablet Take 1 tablet (500 mg total) by mouth 2 (two) times daily with a meal. 01/02/23  Yes Everrett Coombe, DO  Misc. Devices (FLEX THERAPY) MISC by Does not apply route.   Yes [provider]  mometasone (ELOCON) 0.1 % cream Apply 1 Application topically daily. 07/15/22  Yes Everrett Coombe, DO  Multiple Vitamin (MULTIVITAMIN WITH MINERALS)  TABS tablet Take 1 tablet by mouth daily.   Yes [provider]  Probiotic Product (PROBIOTIC PO) Take 2 capsules by mouth daily.   Yes [provider]  rOPINIRole (REQUIP) 2 MG tablet TAKE 1 TABLET THREE TIMES DAILY 10/14/22  Yes Huston Foley, MD  tadalafil (CIALIS) 20 MG tablet TAKE 1/2 TO 1 (ONE-HALF TO ONE) TABLET BY MOUTH EVERY OTHER DAY FOR ERECTILE DYSFUNCTION Patient taking differently: Take 10-20 mg by mouth every other day. FOR ERECTILE DYSFUNCTION 01/27/23  Yes Everrett Coombe, DO  tamsulosin (FLOMAX) 0.4 MG CAPS capsule Take 2 capsules (0.8 mg total) by mouth daily. 01/02/23  Yes Everrett Coombe, DO  traMADol (ULTRAM) 50 MG tablet TAKE 1 TABLET BY MOUTH EVERY 8 HOURS AS NEEDED FOR MODERATE PAIN Patient taking differently: Take 50 mg by mouth as needed for moderate pain (usually take on fridays). 12/11/22  Yes Monica Becton, MD  triamcinolone cream (KENALOG) 0.1 % Apply 1 Application topically as needed (flares). 02/03/23  Yes [provider]  VITAMIN D PO Take 2,000 Units by mouth daily.   Yes [provider]  benzonatate (TESSALON) 200 MG capsule Take 1 capsule (200 mg total) by mouth 2 (two) times daily as needed for cough. Patient not  taking: Reported on 03/12/2023 06/20/22   Everrett Coombe, DO    ___________________________________________________________________________________________________ Physical Exam:    03/12/2023    6:01 PM 03/12/2023    3:11 PM 03/12/2023    2:30 PM  Vitals with BMI  Systolic 145  137  Diastolic 85  99  Pulse 77 85 89     1. General:  in No  Acute distress   Chronically ill-appearing 2. Psychological: Alert and   Oriented 3. Head/ENT:    Dry Mucous Membranes                          Head Non traumatic, neck supple                           Poor Dentition 4. SKIN: decreased Skin turgor,  Skin clean Dry and intact no rash    5. Heart: Regular rate and rhythm no  Murmur, no Rub or gallop 6. Lungs:  no wheezes or crackles   7. Abdomen: Soft,  non-tender, Non distended   obese  bowel sounds present 8. Lower extremities: no clubbing, cyanosis, no  edema 9. Neurologically Grossly intact, moving all 4 extremities equally   10. MSK: Normal range of motion    Chart has been reviewed  ______________________________________________________________________________________________  Assessment/Plan 72 y.o. male with medical history significant of HLD, HTN OSA, DM2  parkinson disease, hypogonadism,   Admitted for acute cholecystitis  Present on Admission:  Acute cholecystitis  Essential hypertension, benign  Dyslipidemia  BPH (benign prostatic hyperplasia)  Parkinsonism     Acute cholecystitis Febrile on arrival blood cultures ordered continue Zosyn  Pain now improved Appreciate general surgery consult   n.p.o. postmidnight Pain management  Essential hypertension, benign Allow permissive hypertension for tonight  Dyslipidemia Restart Lipitor 40 mg a day when able to tolerate  OSA on CPAP Hold off on CPAP for tonight we will be able to restart once patient is less symptomatic  Type 2 diabetes mellitus without complication, without long-term current use of insulin  (HCC) Order sliding scale hold p.o. medications  BPH (benign prostatic hyperplasia) Resume Flomax 0.4 g p.o. when able to tolerate  Parkinsonism Restart  Sinemet per home dose   Other plan as per orders.  DVT prophylaxis:  SCD      Code Status:    Code Status: Prior FULL CODE  as per patient   I had personally discussed CODE STATUS with patient  ACP none   Family Communication:   Family   at  Bedside  plan of care was discussed on the phone with   Son,   Diet  Diet Orders (From admission, onward)     Start     Ordered   03/12/23 1837  Diet clear liquid Room service appropriate? Yes; Fluid consistency: Thin  Diet effective now       Question Answer Comment  Room service appropriate? Yes   Fluid consistency: Thin      03/12/23 1836            Disposition Plan:            To home once workup is complete and patient is stable   Following barriers for discharge:                            Electrolytes corrected                                                            Pain controlled with PO medications                            able to transition to PO antibiotics                             Will need to be able to tolerate PO                                                    Will need consultants to evaluate patient prior to discharge      Consult Orders  (From admission, onward)           Start     Ordered   03/12/23 1237  Consult to hospitalist  Called [facility] for Consult talked to [facility] at 12:42  Once       Provider:  (Not yet assigned)  Question Answer Comment  Place call to: [facility]   Reason for Consult Admit      03/12/23 1236                              Diabetes care coordinator                    Consults called: General Surgery is aware  Admission status:  ED Disposition     ED Disposition  Admit   Condition  --   Comment  Hospital Area: [facility] [[id number]]  Level of Care: Med-Surg [16]  May  admit patient to [facility] or [facility] if equivalent level of care is available:: Yes  Interfacility transfer: Yes  Covid Evaluation:  Asymptomatic - no recent exposure (last 10 days) testing not required  Diagnosis: Acute cholecystitis [575.0.ICD-9-CM]  Admitting Physician: Maryln Gottron [[id number]]  Attending Physician: Kirby Crigler, MIR Jaxson.Roy [[id number]]  Certification:: I certify this patient will need inpatient services for at least 2 midnights  Estimated Length of Stay: 2           inpatient     I Expect 2 midnight stay secondary to severity of patient's current illness need for inpatient interventions justified by the following:    Severe lab/radiological/exam abnormalities including:     and extensive comorbidities including:  DM2 Morbid Obesity  That are currently affecting medical management.   I expect  patient to be hospitalized for 2 midnights requiring inpatient medical care.  Patient is at high risk for adverse outcome (such as loss of life or disability) if not treated.  Indication for inpatient stay as follows:     severe pain requiring acute inpatient management,  inability to maintain oral hydration    Need for operative/procedural  intervention    Need for IV antibiotics, IV fluids,     Level of care       medical floor       Therisa Doyne 03/12/2023, 10:22 PM    [facility]     after 2 AM please page floor coverage PA If 7AM-7PM, please contact the day team taking care of the patient using [email or web address]

## 2023-03-12 NOTE — Assessment & Plan Note (Signed)
Allow permissive hypertension for tonight 

## 2023-03-12 NOTE — ED Notes (Signed)
Called [facility] for transport talked to Jonathan Yu at [facility]

## 2023-03-12 NOTE — Assessment & Plan Note (Signed)
Resume Flomax 0.4 g p.o. when able to tolerate

## 2023-03-12 NOTE — Progress Notes (Signed)
   03/12/23 2155  Assess: MEWS Score  Temp (!) 101.8 F (38.8 C)  BP 134/70  MAP (mmHg) 87  Pulse Rate 87  Resp 19  SpO2 94 %  O2 Device Room Air  Assess: MEWS Score  MEWS Temp 2  MEWS Systolic 0  MEWS Pulse 0  MEWS RR 0  MEWS LOC 0  MEWS Score 2  MEWS Score Color Yellow  Assess: if the MEWS score is Yellow or Red  Were vital signs accurate and taken at a resting state? Yes  Does the patient meet 2 or more of the SIRS criteria? No  MEWS guidelines implemented  Yes, yellow  Treat  MEWS Interventions Considered administering scheduled or prn medications/treatments as ordered  Take Vital Signs  Increase Vital Sign Frequency  Yellow: Q2hr x1, continue Q4hrs until patient remains green for 12hrs  Escalate  MEWS: Escalate Yellow: Discuss with charge nurse and consider notifying provider and/or RRT  Notify: Charge Nurse/RN  Name of Charge Nurse/RN Notified Nellia RN  Provider Notification  Provider Name/Title Duotouva RN  Date Provider Notified 03/12/23  Time Provider Notified 2158  Method of Notification Page  Notification Reason Other (Comment) (TEMP OF 101.8)  Provider response See new orders  Date of Provider Response 03/12/23  Time of Provider Response 2200  Assess: SIRS CRITERIA  SIRS Temperature  1  SIRS Pulse 0  SIRS Respirations  0  SIRS WBC 0  SIRS Score Sum  1

## 2023-03-12 NOTE — Assessment & Plan Note (Addendum)
Febrile on arrival blood cultures ordered continue Zosyn  Pain now improved Appreciate general surgery consult   n.p.o. postmidnight Pain management

## 2023-03-12 NOTE — ED Notes (Signed)
Pt requested a catheter be placed due to polyuria. EDP advised she preferred male purewick. Tech will apply.

## 2023-03-12 NOTE — Assessment & Plan Note (Addendum)
Restart Lipitor 40 mg a day when able to tolerate

## 2023-03-12 NOTE — ED Triage Notes (Signed)
Abdominal pain with nausea since last night after eating chicken livers.

## 2023-03-12 NOTE — Assessment & Plan Note (Signed)
Restart Sinemet per home dose

## 2023-03-12 NOTE — Assessment & Plan Note (Signed)
Order sliding scale hold p.o. medications 

## 2023-03-12 NOTE — Assessment & Plan Note (Signed)
Hold off on CPAP for tonight we will be able to restart once patient is less symptomatic

## 2023-03-12 NOTE — ED Provider Notes (Signed)
[facility] Provider Note   CSN: [id number] Arrival date & time: 03/12/23  0932     History  Chief Complaint  Patient presents with   Abdominal Pain    Jonathan Yu is a 72 y.o. male.  HPI     72yo male with history of htn, hlpd, type II DM, parkinson disease, hypogonadism, constipation (sees Dr. Yevonne Pax [facility]) who presents with concern fo abdominal pain.  Ate chicken livers at a restaurant last night and thought the last bite tasted funny. Felt some stomach upset and took pepcid x2. Thought maybe constipated and took gummy laxative without relief. Around 2AM developed more severe constant pain to epigastric region with radiation to back bilaterally. Nausea, could not get self to vomit. Has not had BM since yesterday afternoon and it was small. No diarrhea. No fever, dysuria, chest pain or dyspnea. Had similar symptoms with food poisoning in the past and suspicious that is what it is.   Past Medical History:  Diagnosis Date   Allergy    DDD (degenerative disc disease), lumbar    Hyperlipidemia    "no meds since losing 106# 2 yr ago" (10/06/2013)   Hypertension 08/12/2010   "no meds since losing 106# 2 yr ago" (10/06/2013)   OSA on CPAP    "don't wear mask much since losing 106# 2 yr ago" (10/06/2013)   Parkinson's disease    Type II diabetes mellitus (HCC)    "no meds since losing 106# 2 yr ago" (10/06/2013)   Walking pneumonia 08/12/1966    Past Surgical History:  Procedure Laterality Date   ANKLE FRACTURE SURGERY Left 1973   ANKLE RECONSTRUCTION Right 05/2011   CARPAL TUNNEL RELEASE Bilateral 2001   HARDWARE REMOVAL Right 10/07/2013   Procedure: RIGHT ANKLE HARDWARE REMOVAL;  Surgeon: Nadara Mustard, MD;  Location: [facility] OR;  Service: Orthopedics;  Laterality: Right;   I & D EXTREMITY Right 11/18/2013   Procedure: IRRIGATION AND DEBRIDEMENT EXTREMITY;  Surgeon: Nadara Mustard, MD;  Location: [facility] OR;  Service: Orthopedics;   Laterality: Right;  Irrigation and Debridement Right Fibula , Place Antibiotic Beads and VAC   NASAL SEPTUM SURGERY  1982   ORIF ANKLE FRACTURE Right 09/08/2013   Procedure: REPAIR SYNDESMOSIS DISRUPTION RIGHT ANKLE;  Surgeon: Nadara Mustard, MD;  Location: [facility] OR;  Service: Orthopedics;  Laterality: Right;   SHOULDER ARTHROSCOPY W/ ROTATOR CUFF REPAIR Right 2006; 2009   TONSILLECTOMY  1959   TOTAL ANKLE ARTHROPLASTY Right 08/18/2013   Procedure: TOTAL ANKLE ARTHOPLASTY;  Surgeon: Nadara Mustard, MD;  Location: [facility] OR;  Service: Orthopedics;  Laterality: Right;  Right Total Ankle Arthroplasty, Revision Fibular Fracture   URETERAL STENT PLACEMENT  ~ 2010 X 2     Home Medications Prior to Admission medications   Medication Sig Start Date End Date Taking? Authorizing Provider  albuterol (PROVENTIL HFA;VENTOLIN HFA) 108 (90 Base) MCG/ACT inhaler Inhale 2 puffs into the lungs every 6 (six) hours as needed for wheezing or shortness of breath. 07/31/17  Yes Rodolph Bong, MD  AMBULATORY NON FORMULARY MEDICATION Continue CPAP at current settings.  Please provide new supplies. [name] phone number [phone] 06/03/19  Yes Rodolph Bong, MD  AMBULATORY NON FORMULARY MEDICATION Compression stockings.  Vive size LGM Leg swelling. Disp 1 pair 99 refil. 06/09/19  Yes Rodolph Bong, MD  anastrozole (ARIMIDEX) 1 MG tablet Take 1 tablet (1 mg total) by mouth every Monday, Wednesday, and Friday. 01/03/23  Yes Everrett Coombe, DO  atorvastatin (LIPITOR) 40 MG tablet TAKE 1 TABLET EVERY DAY Patient taking differently: Take 40 mg by mouth daily. 09/27/22  Yes Everrett Coombe, DO  Azelaic Acid 15 % cream Apply 1 application topically 2 (two) times daily. Patient taking differently: Apply 1 application  topically 2 (two) times daily as needed (flares). 06/03/19  Yes Rodolph Bong, MD  busPIRone (BUSPAR) 5 MG tablet Take 1 tablet (5 mg total) by mouth 4 (four) times daily as needed. Patient taking differently: Take  5 mg by mouth 3 (three) times daily. 03/26/22  Yes Butch Penny, NP  Carbidopa-Levodopa ER (SINEMET CR) 25-100 MG tablet controlled release Take 1 tablet by mouth daily. Dose will increase by 1 tablet each week until taking 3 times daily 02/26/23 03/11/24 Yes [provider]  fluticasone (FLONASE) 50 MCG/ACT nasal spray Place 1 spray into both nostrils as needed for allergies.   Yes [provider]  furosemide (LASIX) 20 MG tablet TAKE 1 TABLET BY MOUTH ONCE DAILY AS NEEDED FOR EDEMA Patient taking differently: Take 20 mg by mouth as needed for edema or fluid. 03/12/22  Yes Everrett Coombe, DO  glipiZIDE (GLUCOTROL) 5 MG tablet Take 1 tablet (5 mg total) by mouth 2 (two) times daily before a meal. 01/02/23  Yes Everrett Coombe, DO  LINZESS 145 MCG CAPS capsule Take 145 mcg by mouth daily. 02/05/23  Yes [provider]  loratadine (CLARITIN) 10 MG tablet Take 10 mg by mouth daily as needed for allergies.   Yes [provider]  losartan (COZAAR) 50 MG tablet TAKE 1 TABLET EVERY DAY Patient taking differently: Take 50 mg by mouth daily. 09/27/22  Yes Everrett Coombe, DO  metFORMIN (GLUCOPHAGE-XR) 500 MG 24 hr tablet Take 1 tablet (500 mg total) by mouth 2 (two) times daily with a meal. 01/02/23  Yes Everrett Coombe, DO  Misc. Devices (FLEX THERAPY) MISC by Does not apply route.   Yes [provider]  mometasone (ELOCON) 0.1 % cream Apply 1 Application topically daily. 07/15/22  Yes Everrett Coombe, DO  Multiple Vitamin (MULTIVITAMIN WITH MINERALS) TABS tablet Take 1 tablet by mouth daily.   Yes [provider]  Probiotic Product (PROBIOTIC PO) Take 2 capsules by mouth daily.   Yes [provider]  rOPINIRole (REQUIP) 2 MG tablet TAKE 1 TABLET THREE TIMES DAILY 10/14/22  Yes Huston Foley, MD  tadalafil (CIALIS) 20 MG tablet TAKE 1/2 TO 1 (ONE-HALF TO ONE) TABLET BY MOUTH EVERY OTHER DAY FOR ERECTILE DYSFUNCTION Patient taking differently: Take 10-20 mg by  mouth every other day. FOR ERECTILE DYSFUNCTION 01/27/23  Yes Everrett Coombe, DO  tamsulosin (FLOMAX) 0.4 MG CAPS capsule Take 2 capsules (0.8 mg total) by mouth daily. 01/02/23  Yes Everrett Coombe, DO  traMADol (ULTRAM) 50 MG tablet TAKE 1 TABLET BY MOUTH EVERY 8 HOURS AS NEEDED FOR MODERATE PAIN Patient taking differently: Take 50 mg by mouth as needed for moderate pain (usually take on fridays). 12/11/22  Yes Monica Becton, MD  triamcinolone cream (KENALOG) 0.1 % Apply 1 Application topically as needed (flares). 02/03/23  Yes [provider]  VITAMIN D PO Take 2,000 Units by mouth daily.   Yes [provider]  benzonatate (TESSALON) 200 MG capsule Take 1 capsule (200 mg total) by mouth 2 (two) times daily as needed for cough. Patient not taking: Reported on 03/12/2023 06/20/22   Everrett Coombe, DO      Allergies    Sesame oil, Clonazepam,  Tape, Percocet [oxycodone-acetaminophen], Soy allergy, Topamax [topiramate], and Vicodin [hydrocodone-acetaminophen]    Review of Systems   Review of Systems  Physical Exam Updated Vital Signs BP 134/70 (BP Location: Left Arm)   Pulse 87   Temp (!) 101.8 F (38.8 C) (Oral)   Resp 19   Ht 5\' 5"  (1.651 m)   Wt 118.6 kg   SpO2 94%   BMI 43.52 kg/m  Physical Exam Vitals and nursing note reviewed.  Constitutional:      General: He is not in acute distress.    Appearance: He is well-developed. He is not diaphoretic.  HENT:     Head: Normocephalic and atraumatic.  Eyes:     Conjunctiva/sclera: Conjunctivae normal.  Cardiovascular:     Rate and Rhythm: Normal rate and regular rhythm.     Heart sounds: Normal heart sounds. No murmur heard.    No friction rub. No gallop.  Pulmonary:     Effort: Pulmonary effort is normal. No respiratory distress.     Breath sounds: Normal breath sounds. No wheezing or rales.  Abdominal:     General: There is no distension.     Palpations: Abdomen is soft.     Tenderness: There is abdominal  tenderness in the right upper quadrant and epigastric area. There is no guarding. Negative signs include Murphy's sign.  Musculoskeletal:     Cervical back: Normal range of motion.  Skin:    General: Skin is warm and dry.  Neurological:     Mental Status: He is alert and oriented to person, place, and time.     ED Results / Procedures / Treatments   Labs (all labs ordered are listed, but only abnormal results are displayed) Labs Reviewed  COMPREHENSIVE METABOLIC PANEL - Abnormal; Notable for the following components:      Result Value   Glucose, Bld 217 (*)    Total Bilirubin 1.8 (*)    All other components within normal limits  URINALYSIS, ROUTINE W REFLEX MICROSCOPIC - Abnormal; Notable for the following components:   Glucose, UA 250 (*)    All other components within normal limits  GLUCOSE, CAPILLARY - Abnormal; Notable for the following components:   Glucose-Capillary 138 (*)    All other components within normal limits  CULTURE, BLOOD (ROUTINE X 2)  CULTURE, BLOOD (ROUTINE X 2)  LIPASE, BLOOD  CBC  HEMOGLOBIN A1C  HIV ANTIBODY (ROUTINE TESTING W REFLEX)  MAGNESIUM  PHOSPHORUS  COMPREHENSIVE METABOLIC PANEL  CBC  LACTIC ACID, PLASMA  LACTIC ACID, PLASMA  MAGNESIUM  PHOSPHORUS  CK  TROPONIN I (HIGH SENSITIVITY)  TROPONIN I (HIGH SENSITIVITY)    EKG EKG Interpretation Date/Time:  Wednesday March 12 2023 07:48:51 EDT Ventricular Rate:  67 PR Interval:  181 QRS Duration:  96 QT Interval:  406 QTC Calculation: 429 R Axis:   -37  Text Interpretation: Sinus rhythm Left axis deviation No significant change since last tracing Confirmed by Alvira Monday ([id number]) on 03/12/2023 8:03:38 AM  Radiology US Abdomen Limited RUQ (LIVER/GB)  Result Date: 03/12/2023 CLINICAL DATA:  Right upper quadrant pain, nausea, and vomiting. Findings concerning for acute cholecystitis on CT. EXAM: ULTRASOUND ABDOMEN LIMITED RIGHT UPPER QUADRANT COMPARISON:  CT abdomen and pelvis  03/12/2023 FINDINGS: Gallbladder: Multiple stones in the gallbladder. The margins of the gallbladder wall are not particularly well-defined on this ultrasound, however gallbladder wall thickening is suspected when correlating with the earlier CT. A positive sonographic Eulah Pont sign was reported by the sonographer. Common bile duct: Diameter:  3 mm Liver: Diffusely increased parenchymal echogenicity without a focal lesion identified. Portal vein is patent on color Doppler imaging with normal direction of blood flow towards the liver. Other: None. IMPRESSION: 1. Cholelithiasis with positive sonographic Murphy sign and suspected gallbladder wall thickening, concerning for acute cholecystitis. 2. No biliary dilatation. 3. Hepatic steatosis. Electronically Signed   By: Sebastian Ache M.D.   On: 03/12/2023 11:18   CT ABDOMEN PELVIS W CONTRAST  Result Date: 03/12/2023 CLINICAL DATA:  Abdominal pain, acute, nonlocalized eval for SBO vs cholecystitis EXAM: CT ABDOMEN AND PELVIS WITH CONTRAST TECHNIQUE: Multidetector CT imaging of the abdomen and pelvis was performed using the standard protocol following bolus administration of intravenous contrast. RADIATION DOSE REDUCTION: This exam was performed according to the departmental dose-optimization program which includes automated exposure control, adjustment of the mA and/or kV according to patient size and/or use of iterative reconstruction technique. CONTRAST:  OMNIPAQUE IOHEXOL 300 MG/ML  SOLN COMPARISON:  CT urogram from 09/28/2009. FINDINGS: Lower chest: There are patchy atelectatic changes in the visualized lung bases. No overt consolidation. No pleural effusion. The heart is normal in size. No pericardial effusion. Hepatobiliary: The liver is normal in size. Non-cirrhotic configuration. No suspicious mass. These is mild diffuse hepatic steatosis. No intrahepatic or extrahepatic bile duct dilation. Gallbladder is distended measuring up to 4.2 cm in width. There is  mild pericholecystic fat stranding. There are multiple layering gallstones in the neck region. There is a 3.5 x 5 mm stone which is outside the gallbladder lumen and likely within the cystic duct. In appropriate clinical setting, findings are highly concerning for acute cholecystitis. Correlate clinically to determine the need for additional imaging with nuclear medicine HIDA scan or ultrasound. Pancreas: Unremarkable. No pancreatic ductal dilatation or surrounding inflammatory changes. Spleen: Within normal limits. No focal lesion. Adrenals/Urinary Tract: Adrenal glands are unremarkable. No suspicious renal mass. No hydronephrosis. No renal or ureteric calculi. Unremarkable urinary bladder. Stomach/Bowel: No disproportionate dilation of the small or large bowel loops. No evidence of abnormal bowel wall thickening or inflammatory changes. The appendix is unremarkable. There are scattered diverticula throughout the colon, without imaging signs of diverticulitis. Vascular/Lymphatic: No ascites or pneumoperitoneum. No abdominal or pelvic lymphadenopathy, by size criteria. No aneurysmal dilation of the major abdominal arteries. There are mild peripheral atherosclerotic vascular calcifications of the aorta and its major branches. Reproductive: Enlarged prostate. Symmetric seminal vesicles. Other: There are bilateral small fat containing inguinal hernias. Partially seen intramuscular lipoma in the right proximal sartorius muscle. Musculoskeletal: No suspicious osseous lesions. There are mild multilevel degenerative changes in the visualized spine. IMPRESSION: *Findings highly concerning for acute cholecystitis. Correlate clinically to determine the need for additional imaging with nuclear medicine HIDA scan or ultrasound. *Multiple other nonacute observations, as described above. Electronically Signed   By: Jules Schick M.D.   On: 03/12/2023 09:20    Procedures Procedures    Medications Ordered in ED Medications   piperacillin-tazobactam (ZOSYN) IVPB 3.375 g (3.375 g Intravenous New Bag/Given 03/12/23 2027)  0.9 %  sodium chloride infusion ( Intravenous New Bag/Given 03/12/23 1250)  HYDROmorphone (DILAUDID) injection 0.5-1 mg (1 mg Intravenous Given 03/12/23 2026)  insulin aspart (novoLOG) injection 0-9 Units (1 Units Subcutaneous Given 03/12/23 2231)  Carbidopa-Levodopa ER (SINEMET CR) 25-100 MG tablet controlled release 1 tablet (1 tablet Oral Given 03/12/23 2230)  0.9 %  sodium chloride infusion ( Intravenous New Bag/Given 03/12/23 2232)  ondansetron (ZOFRAN) tablet 4 mg (has no administration in time range)    Or  ondansetron (ZOFRAN) injection 4 mg (has no administration in time range)  acetaminophen (TYLENOL) tablet 650 mg (650 mg Oral Given 03/12/23 2230)  ondansetron (ZOFRAN) injection 4 mg (4 mg Intravenous Given 03/12/23 0811)  lactated ringers bolus 1,000 mL (0 mLs Intravenous Stopped 03/12/23 1002)  morphine (PF) 4 MG/ML injection 4 mg (4 mg Intravenous Given 03/12/23 0811)  iohexol (OMNIPAQUE) 300 MG/ML solution 100 mL (100 mLs Intravenous Contrast Given 03/12/23 0831)  HYDROmorphone (DILAUDID) injection 1 mg (1 mg Intravenous Given 03/12/23 0928)  HYDROmorphone (DILAUDID) injection 1 mg (1 mg Intravenous Given 03/12/23 1134)  piperacillin-tazobactam (ZOSYN) IVPB 3.375 g (0 g Intravenous Stopped 03/12/23 1325)  HYDROmorphone (DILAUDID) injection 1 mg (1 mg Intravenous Given 03/12/23 1458)  hydrOXYzine (ATARAX) tablet 25 mg (25 mg Oral Given 03/12/23 1709)    ED Course/ Medical Decision Making/ A&P       72yo male with history of htn, hlpd, type II DM, parkinson disease, hypogonadism, constipation (sees Dr. Yevonne Pax [facility]) who presents with concern fo abdominal pain.  DDx includes appendicitis, pancreatitis, cholecystitis, pyelonephritis, nephrolithiasis, diverticulitis, SBO, AAA, choledocolithiasis, DKA, gastritis, ACS.  EKG completed and personally evaluate and interpreted by me within normal  sinus rhythm without acute ST changes.  Given IV fluids, Zofran, and morphine for pain.  Labs completed and personally about interpreted by me shows no sign of urinary tract infection, no ketonuria, normal bicarb, no anion gap and no signs of DKA or clinically significant electrolyte abnormalities.  No transaminitis or signs of pancreatitis.  No leukocytosis, no anemia.  Troponin is within normal limits and have low suspicion for ACS.  CT abdomen pelvis was obtained which shows findings concerning for acute cholecystitis with mild pericholecystic fat stranding, multiple layering gallstones in the gallbladder neck and concern for stone in the cystic duct.  Ordered US shows cholelithiasis with positive sonographic Murphy sign, suspected GB wall thickening.  Suspect early cholecystitis. Given zosyn, consulted General Surgery. Recommends hospitalist admission, plan likely for surgery tomorrow AM.           Final Clinical Impression(s) / ED Diagnoses Final diagnoses:  Cholecystitis  Epigastric pain  Calculus of gallbladder with acute cholecystitis without obstruction    Rx / DC Orders ED Discharge Orders     None         Alvira Monday, MD 03/12/23 2324

## 2023-03-12 NOTE — Progress Notes (Signed)
Plan of Care Note for Accepted Transfer   Patient: Jonathan Yu    NWG:[id number]     Facility requesting transfer: [facility] Requesting Provider: Dr. Dalene Seltzer  61 male with RUQ pain after dinner last night. Workup and imaging consistent with acute cholecystitis. EDP discussed with surgery who requests hospitalist admission and NPO at midnight since unable to take to OR today.   Plan to notify surgery when he arrives at [facility].   Most recent vitals, labs and radiology:  Blood pressure (!) 143/86, pulse 82, temperature 98.1 F (36.7 C), temperature source Oral, resp. rate (!) 23, height 5\' 5"  (1.651 m), weight 118.6 kg, SpO2 96%.      Latest Ref Rng & Units 03/12/2023    7:48 AM 07/18/2022    1:33 PM 10/04/2021   12:00 AM  CBC  WBC 4.0 - 10.5 K/uL 7.6  6.3  5.3   Hemoglobin 13.0 - 17.0 g/dL 57.8  46.9  62.9   Hematocrit 39.0 - 52.0 % 43.9  44.8  48.8   Platelets 150 - 400 K/uL 167  208  200       Latest Ref Rng & Units 03/12/2023    7:48 AM 07/18/2022    1:33 PM 10/25/2021   12:00 AM  BMP  Glucose 70 - 99 mg/dL 528  413  244   BUN 8 - 23 mg/dL 12  15  17    Creatinine 0.61 - 1.24 mg/dL 0.10  2.72  5.36   BUN/Creat Ratio 6 - 22 (calc)  SEE NOTE:  NOT APPLICABLE   Sodium 135 - 145 mmol/L 141  139  142   Potassium 3.5 - 5.1 mmol/L 4.1  3.9  4.7   Chloride 98 - 111 mmol/L 106  103  104   CO2 22 - 32 mmol/L 26  28  28    Calcium 8.9 - 10.3 mg/dL 9.4  9.6  64.4      US Abdomen Limited RUQ (LIVER/GB)  Result Date: 03/12/2023 CLINICAL DATA:  Right upper quadrant pain, nausea, and vomiting. Findings concerning for acute cholecystitis on CT. EXAM: ULTRASOUND ABDOMEN LIMITED RIGHT UPPER QUADRANT COMPARISON:  CT abdomen and pelvis 03/12/2023 FINDINGS: Gallbladder: Multiple stones in the gallbladder. The margins of the gallbladder wall are not particularly well-defined on this ultrasound, however gallbladder wall thickening is suspected when correlating with the earlier CT. A positive  sonographic Eulah Pont sign was reported by the sonographer. Common bile duct: Diameter: 3 mm Liver: Diffusely increased parenchymal echogenicity without a focal lesion identified. Portal vein is patent on color Doppler imaging with normal direction of blood flow towards the liver. Other: None. IMPRESSION: 1. Cholelithiasis with positive sonographic Murphy sign and suspected gallbladder wall thickening, concerning for acute cholecystitis. 2. No biliary dilatation. 3. Hepatic steatosis. Electronically Signed   By: Sebastian Ache M.D.   On: 03/12/2023 11:18   CT ABDOMEN PELVIS W CONTRAST  Result Date: 03/12/2023 CLINICAL DATA:  Abdominal pain, acute, nonlocalized eval for SBO vs cholecystitis EXAM: CT ABDOMEN AND PELVIS WITH CONTRAST TECHNIQUE: Multidetector CT imaging of the abdomen and pelvis was performed using the standard protocol following bolus administration of intravenous contrast. RADIATION DOSE REDUCTION: This exam was performed according to the departmental dose-optimization program which includes automated exposure control, adjustment of the mA and/or kV according to patient size and/or use of iterative reconstruction technique. CONTRAST:  OMNIPAQUE IOHEXOL 300 MG/ML  SOLN COMPARISON:  CT urogram from 09/28/2009. FINDINGS: Lower chest: There are patchy atelectatic changes in the visualized lung  bases. No overt consolidation. No pleural effusion. The heart is normal in size. No pericardial effusion. Hepatobiliary: The liver is normal in size. Non-cirrhotic configuration. No suspicious mass. These is mild diffuse hepatic steatosis. No intrahepatic or extrahepatic bile duct dilation. Gallbladder is distended measuring up to 4.2 cm in width. There is mild pericholecystic fat stranding. There are multiple layering gallstones in the neck region. There is a 3.5 x 5 mm stone which is outside the gallbladder lumen and likely within the cystic duct. In appropriate clinical setting, findings are highly  concerning for acute cholecystitis. Correlate clinically to determine the need for additional imaging with nuclear medicine HIDA scan or ultrasound. Pancreas: Unremarkable. No pancreatic ductal dilatation or surrounding inflammatory changes. Spleen: Within normal limits. No focal lesion. Adrenals/Urinary Tract: Adrenal glands are unremarkable. No suspicious renal mass. No hydronephrosis. No renal or ureteric calculi. Unremarkable urinary bladder. Stomach/Bowel: No disproportionate dilation of the small or large bowel loops. No evidence of abnormal bowel wall thickening or inflammatory changes. The appendix is unremarkable. There are scattered diverticula throughout the colon, without imaging signs of diverticulitis. Vascular/Lymphatic: No ascites or pneumoperitoneum. No abdominal or pelvic lymphadenopathy, by size criteria. No aneurysmal dilation of the major abdominal arteries. There are mild peripheral atherosclerotic vascular calcifications of the aorta and its major branches. Reproductive: Enlarged prostate. Symmetric seminal vesicles. Other: There are bilateral small fat containing inguinal hernias. Partially seen intramuscular lipoma in the right proximal sartorius muscle. Musculoskeletal: No suspicious osseous lesions. There are mild multilevel degenerative changes in the visualized spine. IMPRESSION: *Findings highly concerning for acute cholecystitis. Correlate clinically to determine the need for additional imaging with nuclear medicine HIDA scan or ultrasound. *Multiple other nonacute observations, as described above. Electronically Signed   By: Jules Schick M.D.   On: 03/12/2023 09:20     The patient has been accepted for transfer to [facility].  The patient will remain under the care and responsibility of the referring provider until they have arrived to our inpatient facility.  Author: Rolondo Pierre Sharlette Dense, MD  03/12/2023  Check [email or web address] for on-call coverage.  Nursing staff,  Please call [facility] Admits & Consults System-Wide number on Amion as soon as patient's arrival, so appropriate admitting provider can evaluate the pt.

## 2023-03-12 NOTE — Subjective & Objective (Signed)
Had abd pain and nausea since last night pain became more severe around 2 AM No diarrhea no fever No CP

## 2023-03-13 ENCOUNTER — Inpatient Hospital Stay (HOSPITAL_COMMUNITY): Payer: Medicare HMO

## 2023-03-13 DIAGNOSIS — K810 Acute cholecystitis: Secondary | ICD-10-CM | POA: Diagnosis not present

## 2023-03-13 LAB — GLUCOSE, CAPILLARY
Glucose-Capillary: 103 mg/dL — ABNORMAL HIGH (ref 70–99)
Glucose-Capillary: 112 mg/dL — ABNORMAL HIGH (ref 70–99)
Glucose-Capillary: 119 mg/dL — ABNORMAL HIGH (ref 70–99)
Glucose-Capillary: 123 mg/dL — ABNORMAL HIGH (ref 70–99)
Glucose-Capillary: 124 mg/dL — ABNORMAL HIGH (ref 70–99)
Glucose-Capillary: 132 mg/dL — ABNORMAL HIGH (ref 70–99)

## 2023-03-13 MED ORDER — SODIUM CHLORIDE 0.9 % IV SOLN: INTRAVENOUS | Status: DC

## 2023-03-13 MED ORDER — GADOBUTROL 1 MMOL/ML IV SOLN
10.0000 mL | Freq: Once | INTRAVENOUS | Status: AC | PRN
  Administered 2023-03-13: 10 mL via INTRAVENOUS

## 2023-03-13 MED ORDER — LORAZEPAM 2 MG/ML IJ SOLN
1.0000 mg | INTRAMUSCULAR | Status: AC
  Administered 2023-03-13: 1 mg via INTRAVENOUS
  Filled 2023-03-13: qty 1

## 2023-03-13 NOTE — Progress Notes (Signed)
Mobility Specialist - Progress Note   03/13/23 0859  Mobility  Activity Ambulated with assistance in hallway  Level of Assistance Moderate assist, patient does 50-74%  Assistive Device Front wheel walker  Distance Ambulated (ft) 250 ft  Activity Response Tolerated well  Mobility Referral Yes  $Mobility charge 1 Mobility  Mobility Specialist Start Time (ACUTE ONLY) [id number]  Mobility Specialist Stop Time (ACUTE ONLY) 0858  Mobility Specialist Time Calculation (min) (ACUTE ONLY) 24 min   Pt received in bed and agreeable to mobility. Pt was ModA for bed mobility & from sit>stand, requiring +2 assistance from supine to sit. SB during ambulation. C/o 8/10 abdomen pain during ambulation. No other complaints during session. Pt required +2 assistance for getting back in bed. Pt to bed after session with all needs met & nurse in room.   [facility]

## 2023-03-13 NOTE — Plan of Care (Signed)
  Problem: Education: Goal: Knowledge of General Education information will improve Description: Including pain rating scale, medication(s)/side effects and non-pharmacologic comfort measures Outcome: Progressing   Problem: Clinical Measurements: Goal: Will remain free from infection Outcome: Progressing Goal: Diagnostic test results will improve Outcome: Progressing Goal: Respiratory complications will improve Outcome: Progressing Goal: Cardiovascular complication will be avoided Outcome: Progressing   Problem: Nutrition: Goal: Adequate nutrition will be maintained Outcome: Progressing   Problem: Coping: Goal: Level of anxiety will decrease Outcome: Progressing   Problem: Elimination: Goal: Will not experience complications related to urinary retention Outcome: Progressing   Problem: Pain Managment: Goal: General experience of comfort will improve Outcome: Progressing

## 2023-03-13 NOTE — Progress Notes (Addendum)
PROGRESS NOTE    Jonathan Yu  WUJ:[id number] DOB: 12-Nov-1950 DOA: 03/12/2023 PCP: Everrett Coombe, DO    Brief Narrative:   Jonathan Yu is a 72 y.o. male with past medical history significant for HTN, HLD, type 2 diabetes mellitus, OSA, Parkinson's disease, hypogonadism who presented to [facility] ED with complaints of abdominal pain.  Patient reports he was eating fried chicken livers at a restaurant night prior and felt some stomach upset in which he took Pepcid.  No relief with medication and start developing more severe constant pain to his epigastric region associated with nausea.  Initially thought was due to food poisoning but given his progressive symptoms he sought further care in the ED.  In the ED, temperature 101.8 F, HR 63, RR 20, BP 168/85, SpO2 96% on room air.  WBC 7.6, hemoglobin 14.8, platelets 167.  Sodium 141, potassium 4.1, chloride 106, CO2 26, glucose 217, BUN 12, creatinine 0.98.  AST 23, ALT 11, total bili 1.8.  Lipase 46.  High sensitive troponin 5.  Lactic acid 1.2.  Urinalysis unrevealing.  CT abdomen/pelvis with contrast with findings highly concerning for acute cholecystitis.  Ultrasound abdomen with cholelithiasis with positive sonographic Murphy sign, suspected gallbladder wall thickening, no biliary dilation, hepatic steatosis.  General surgery was consulted.  Patient was started on empiric antibiotics.  [facility] consulted for admission and patient was transferred to [facility] for further evaluation management for concern of acute cholecystitis.  Assessment & Plan:   Acute cholecystitis Patient presenting to the ED with acute onset epigastric pain associated with fever and nausea.  WBC count 7.6. CT abdomen/pelvis with contrast with findings highly concerning for acute cholecystitis.  Ultrasound abdomen with cholelithiasis with positive sonographic Murphy sign, suspected gallbladder wall thickening, no biliary dilation. -- General surgery  following, appreciate assistance -- D/W GI Marca Ancona), no biliary dilation on imaging, elevated total bilirubin and fever related to cholecystitis and no indication for endoscopic procedure with recommendation of cholecystectomy and IOC. -- WBC 7.6>9.5 -- MRCP: Pending -- Zosyn 3.375 g IV every 8 hours -- NS at 75 mL/h -- Dilaudid 0.5-1 mg IV every 4 hours as needed pain control -- CBC daily -- Further per general surgery  Elevated total bilirubin Etiology likely secondary to acute cholecystitis but given gallstones noted on imaging, always concern for choledocholithiasis.  Discussed with GI as above. -- MRCP: Pending -- CMP daily  Essential hypertension Home regimen includes losartan 50 mg p.o. daily, furosemide 20 mg p.o. daily. -- Hold oral hypertensives for now  Hyperlipidemia -- Holding home atorvastatin 40 mg p.o. daily  Type 2 diabetes mellitus Home regimen includes metformin 500 mg p.o. twice daily, glipizide 5 mg p.o. twice daily.  Open A1c 6.7, well-controlled. -- Hold oral diabetic medications -- Sensitive SSI for coverage -- CBGs every 4 hours while NPO  Parkinson's disease Home regimen includes Requip 2 mg 3 times daily, Sinemet, BuSpar. -- Continue Sinemet  OSA -- Nocturnal CPAP  Morbid obesity Body mass index is 43.52 kg/m.  Discussed with patient needs for aggressive lifestyle changes/weight loss as this complicates all facets of care.  Outpatient follow-up with PCP.     DVT prophylaxis: SCDs Start: 03/12/23 2042    Code Status: Full Code Family Communication: No family present at bedside this morning  Disposition Plan:  Level of care: Med-Surg Status is: Inpatient Remains inpatient appropriate because: Pending MRCP, may need laparoscopic cholecystectomy versus IR cholecystostomy drain placement    Consultants:  General surgery Discussed with [facility]  GI, Dr. Marca Ancona on 8/1  Procedures:  None  Antimicrobials:  Zosyn   Subjective: Patient seen and  examined at bedside, resting calmly.  Continues with mild epigastric/right upper quadrant discomfort, although improved.  No further nausea.  Discussed case with GI as stated below.  Also discussed with general surgery.  Pending MRCP.  Most likely will be receiving an IR cholecystostomy tube.  Patient with no other questions or concerns at this time and appreciative all the care is receiving at [facility].  Denies headache, no dizziness, no chest pain, no palpitations, no shortness of breath, no cough/congestion, no fever/chills/night sweats, no current nausea/vomiting, no diarrhea, no focal weakness, no fatigue, no paresthesias.  No acute events overnight per nursing staff.  Per GI, Dr. Marca Ancona  "I have reviewed his USG and CT, his CBD is 3 mm and he has no biliary dilation on imaging. He has cholecystitis on imaging which explains his elevated TB and fever. I do not see a reason to be involved currently. Recommend cholecystectomy and intraoperative cholangiogram and please call us if IOC is positive"   Objective: Vitals:   03/13/23 0100 03/13/23 0447 03/13/23 0510 03/13/23 0958  BP: 126/67 (!) 123/91  126/61  Pulse: 86 92  88  Resp: 17 17  18   Temp: 98.6 F (37 C) 98.3 F (36.8 C)  98.2 F (36.8 C)  TempSrc: Oral Oral  Oral  SpO2: 95% (!) 89% 94% 90%  Weight:      Height:        Intake/Output Summary (Last 24 hours) at 03/13/2023 1218 Last data filed at 03/13/2023 1000 Gross per 24 hour  Intake 466.61 ml  Output 400 ml  Net 66.61 ml   Filed Weights   03/12/23 0738  Weight: 118.6 kg    Examination:  Physical Exam: GEN: NAD, alert and oriented x 3, obese HEENT: NCAT, PERRL, EOMI, sclera clear, MMM PULM: CTAB w/o wheezes/crackles, normal respiratory effort, on room air CV: RRR w/o M/G/R GI: abd soft, nondistended, mild TTP RUQ/epigastric region + BS, no R/G/M MSK: no peripheral edema, muscle strength globally intact 5/5 bilateral upper/lower extremities NEURO: CN II-XII  intact, no focal deficits, sensation to light touch intact PSYCH: normal mood/affect Integumentary: dry/intact, no rashes or wounds    Data Reviewed: I have personally reviewed following labs and imaging studies  CBC: Recent Labs  Lab 03/12/23 0748 03/13/23 0022  WBC 7.6 9.5  HGB 14.8 14.0  HCT 43.9 42.4  MCV 87.8 90.0  PLT 167 164   Basic Metabolic Panel: Recent Labs  Lab 03/12/23 0748 03/13/23 0022  NA 141 135  K 4.1 3.5  CL 106 101  CO2 26 25  GLUCOSE 217* 122*  BUN 12 12  CREATININE 0.98 0.92  CALCIUM 9.4 8.4*  MG  --  1.9  PHOS  --  3.7   GFR: Estimated Creatinine Clearance: 89.1 mL/min (by C-G formula based on SCr of 0.92 mg/dL). Liver Function Tests: Recent Labs  Lab 03/12/23 0748 03/13/23 0022  AST 23 33  ALT 11 17  ALKPHOS 84 50  BILITOT 1.8* 3.7*  PROT 6.8 6.1*  ALBUMIN 3.6 3.3*   Recent Labs  Lab 03/12/23 0748  LIPASE 46   No results for input(s): "AMMONIA" in the last 168 hours. Coagulation Profile: No results for input(s): "INR", "PROTIME" in the last 168 hours. Cardiac Enzymes: Recent Labs  Lab 03/13/23 0015  CKTOTAL 74   BNP (last 3 results) No results for input(s): "PROBNP" in  the last 8760 hours. HbA1C: Recent Labs    03/13/23 0022  HGBA1C 6.7*   CBG: Recent Labs  Lab 03/12/23 2153 03/13/23 0017 03/13/23 0436 03/13/23 0745 03/13/23 1149  GLUCAP 138* 119* 132* 124* 112*   Lipid Profile: No results for input(s): "CHOL", "HDL", "LDLCALC", "TRIG", "CHOLHDL", "LDLDIRECT" in the last 72 hours. Thyroid Function Tests: No results for input(s): "TSH", "T4TOTAL", "FREET4", "T3FREE", "THYROIDAB" in the last 72 hours. Anemia Panel: No results for input(s): "VITAMINB12", "FOLATE", "FERRITIN", "TIBC", "IRON", "RETICCTPCT" in the last 72 hours. Sepsis Labs: Recent Labs  Lab 03/13/23 0015 03/13/23 0423  LATICACIDVEN 1.2 1.1    No results found for this or any previous visit (from the past 240 hour(s)).        Radiology Studies: US Abdomen Limited RUQ (LIVER/GB)  Result Date: 03/12/2023 CLINICAL DATA:  Right upper quadrant pain, nausea, and vomiting. Findings concerning for acute cholecystitis on CT. EXAM: ULTRASOUND ABDOMEN LIMITED RIGHT UPPER QUADRANT COMPARISON:  CT abdomen and pelvis 03/12/2023 FINDINGS: Gallbladder: Multiple stones in the gallbladder. The margins of the gallbladder wall are not particularly well-defined on this ultrasound, however gallbladder wall thickening is suspected when correlating with the earlier CT. A positive sonographic Eulah Pont sign was reported by the sonographer. Common bile duct: Diameter: 3 mm Liver: Diffusely increased parenchymal echogenicity without a focal lesion identified. Portal vein is patent on color Doppler imaging with normal direction of blood flow towards the liver. Other: None. IMPRESSION: 1. Cholelithiasis with positive sonographic Murphy sign and suspected gallbladder wall thickening, concerning for acute cholecystitis. 2. No biliary dilatation. 3. Hepatic steatosis. Electronically Signed   By: Sebastian Ache M.D.   On: 03/12/2023 11:18   CT ABDOMEN PELVIS W CONTRAST  Result Date: 03/12/2023 CLINICAL DATA:  Abdominal pain, acute, nonlocalized eval for SBO vs cholecystitis EXAM: CT ABDOMEN AND PELVIS WITH CONTRAST TECHNIQUE: Multidetector CT imaging of the abdomen and pelvis was performed using the standard protocol following bolus administration of intravenous contrast. RADIATION DOSE REDUCTION: This exam was performed according to the departmental dose-optimization program which includes automated exposure control, adjustment of the mA and/or kV according to patient size and/or use of iterative reconstruction technique. CONTRAST:  OMNIPAQUE IOHEXOL 300 MG/ML  SOLN COMPARISON:  CT urogram from 09/28/2009. FINDINGS: Lower chest: There are patchy atelectatic changes in the visualized lung bases. No overt consolidation. No pleural effusion. The heart  is normal in size. No pericardial effusion. Hepatobiliary: The liver is normal in size. Non-cirrhotic configuration. No suspicious mass. These is mild diffuse hepatic steatosis. No intrahepatic or extrahepatic bile duct dilation. Gallbladder is distended measuring up to 4.2 cm in width. There is mild pericholecystic fat stranding. There are multiple layering gallstones in the neck region. There is a 3.5 x 5 mm stone which is outside the gallbladder lumen and likely within the cystic duct. In appropriate clinical setting, findings are highly concerning for acute cholecystitis. Correlate clinically to determine the need for additional imaging with nuclear medicine HIDA scan or ultrasound. Pancreas: Unremarkable. No pancreatic ductal dilatation or surrounding inflammatory changes. Spleen: Within normal limits. No focal lesion. Adrenals/Urinary Tract: Adrenal glands are unremarkable. No suspicious renal mass. No hydronephrosis. No renal or ureteric calculi. Unremarkable urinary bladder. Stomach/Bowel: No disproportionate dilation of the small or large bowel loops. No evidence of abnormal bowel wall thickening or inflammatory changes. The appendix is unremarkable. There are scattered diverticula throughout the colon, without imaging signs of diverticulitis. Vascular/Lymphatic: No ascites or pneumoperitoneum. No abdominal or pelvic lymphadenopathy, by size criteria.  No aneurysmal dilation of the major abdominal arteries. There are mild peripheral atherosclerotic vascular calcifications of the aorta and its major branches. Reproductive: Enlarged prostate. Symmetric seminal vesicles. Other: There are bilateral small fat containing inguinal hernias. Partially seen intramuscular lipoma in the right proximal sartorius muscle. Musculoskeletal: No suspicious osseous lesions. There are mild multilevel degenerative changes in the visualized spine. IMPRESSION: *Findings highly concerning for acute cholecystitis. Correlate  clinically to determine the need for additional imaging with nuclear medicine HIDA scan or ultrasound. *Multiple other nonacute observations, as described above. Electronically Signed   By: Jules Schick M.D.   On: 03/12/2023 09:20        Scheduled Meds:  Carbidopa-Levodopa ER  1 tablet Oral Daily   insulin aspart  0-9 Units Subcutaneous Q4H   Continuous Infusions:  sodium chloride 10 mL/hr at 03/12/23 1250   sodium chloride 75 mL/hr at 03/13/23 0805   piperacillin-tazobactam (ZOSYN)  IV 3.375 g (03/13/23 0437)     LOS: 1 day    Time spent: 53 minutes spent on chart review, discussion with nursing staff, consultants, updating family and interview/physical exam; more than 50% of that time was spent in counseling and/or coordination of care.    Alvira Philips [place], DO [facility] Available via Epic secure chat 7am-7pm After these hours, please refer to coverage provider listed on [email or web address] 03/13/2023, 12:18 PM

## 2023-03-13 NOTE — Consult Note (Signed)
Janyth Pupa [facility] [birth year]  [id number].    Requesting MD: Dr. Eric [place] Chief Complaint/Reason for Consult: Cholecystitis   HPI: Jonathan Yu is a 72 y.o. male with hx of parkinson's, htn, hld, osa on cpap, DM2 who presented to the ED for abdominal pain. Patient reports on Tuesday he ate chicken livers. The last bite tasted bad and 12 hours later he began having generalized abdominal pain and nausea. He thought he had food poising so he tried to self induce vomiting but was unable to. No associated cough, diarrhea, or urinary symptoms. He denies hx of prior issues with his gallbladder but did incidentally find out he had gallstones > 1 year ago while undergoing imaging for his back. Last BM was Tuesday morning. No melena or hematochezia. He is still passing flatus. No alcohol or frequent NSAID use. His CT and RUQ [place] were concerning for Acute Cholecystitis so he was transferred from [facility] to [facility] for evaluation. Since presentation his pain has localized to the right side of his abdomen (RUQ > RLQ), he has developed a fever to 101.8 and T. Bili has risen from 1.8 > 3.7. WBC, lactic, lipase, Alk Phos, AST/ALT and Cr wnl. He is not on blood thinners. No hx of prior abdominal surgeries.   ROS: ROS As above, see hpi  Family History  Problem Relation Age of Onset   Cancer Mother 45       Lung   Alzheimer's disease Father    Parkinson's disease Father    Scoliosis Sister    Cleft lip Sister     Past Medical History:  Diagnosis Date   Allergy    DDD (degenerative disc disease), lumbar    Hyperlipidemia    "no meds since losing 106# 2 yr ago" (10/06/2013)   Hypertension 08/12/2010   "no meds since losing 106# 2 yr ago" (10/06/2013)   OSA on CPAP    "don't wear mask much since losing 106# 2 yr ago" (10/06/2013)   Parkinson's disease    Type II diabetes mellitus (HCC)    "no meds since losing 106# 2 yr ago" (10/06/2013)   Walking pneumonia 08/12/1966    Past Surgical History:   Procedure Laterality Date   ANKLE FRACTURE SURGERY Left 1973   ANKLE RECONSTRUCTION Right 05/2011   CARPAL TUNNEL RELEASE Bilateral 2001   HARDWARE REMOVAL Right 10/07/2013   Procedure: RIGHT ANKLE HARDWARE REMOVAL;  Surgeon: Nadara Mustard, MD;  Location: [facility] OR;  Service: Orthopedics;  Laterality: Right;   I & D EXTREMITY Right 11/18/2013   Procedure: IRRIGATION AND DEBRIDEMENT EXTREMITY;  Surgeon: Nadara Mustard, MD;  Location: [facility] OR;  Service: Orthopedics;  Laterality: Right;  Irrigation and Debridement Right Fibula , Place Antibiotic Beads and VAC   NASAL SEPTUM SURGERY  1982   ORIF ANKLE FRACTURE Right 09/08/2013   Procedure: REPAIR SYNDESMOSIS DISRUPTION RIGHT ANKLE;  Surgeon: Nadara Mustard, MD;  Location: [facility] OR;  Service: Orthopedics;  Laterality: Right;   SHOULDER ARTHROSCOPY W/ ROTATOR CUFF REPAIR Right 2006; 2009   TONSILLECTOMY  1959   TOTAL ANKLE ARTHROPLASTY Right 08/18/2013   Procedure: TOTAL ANKLE ARTHOPLASTY;  Surgeon: Nadara Mustard, MD;  Location: [facility] OR;  Service: Orthopedics;  Laterality: Right;  Right Total Ankle Arthroplasty, Revision Fibular Fracture   URETERAL STENT PLACEMENT  ~ 2010 X 2    Social History:  reports that he has been smoking pipe and cigars. He has never used smokeless tobacco. He reports current alcohol use. He  reports that he does not use drugs.  Allergies:  Allergies  Allergen Reactions   Sesame Oil Hives and Shortness Of Breath   Clonazepam Hives   Tape Other (See Comments)    White surgical tape caused some blisters.   Percocet [Oxycodone-Acetaminophen] Rash   Soy Allergy Hives   Topamax [Topiramate] Other (See Comments)    Cognitive decline   Vicodin [Hydrocodone-Acetaminophen] Rash    Medications Prior to Admission  Medication Sig Dispense Refill   albuterol (PROVENTIL HFA;VENTOLIN HFA) 108 (90 Base) MCG/ACT inhaler Inhale 2 puffs into the lungs every 6 (six) hours as needed for wheezing or shortness of breath. 1 Inhaler 0   AMBULATORY NON  FORMULARY MEDICATION Continue CPAP at current settings.  Please provide new supplies. [name] phone number [phone] 1 each 0   AMBULATORY NON FORMULARY MEDICATION Compression stockings.  Vive size LGM Leg swelling. Disp 1 pair 99 refil. 1 each 99   anastrozole (ARIMIDEX) 1 MG tablet Take 1 tablet (1 mg total) by mouth every Monday, Wednesday, and Friday. 90 tablet 1   atorvastatin (LIPITOR) 40 MG tablet TAKE 1 TABLET EVERY DAY (Patient taking differently: Take 40 mg by mouth daily.) 90 tablet 3   Azelaic Acid 15 % cream Apply 1 application topically 2 (two) times daily. (Patient taking differently: Apply 1 application  topically 2 (two) times daily as needed (flares).) 50 g 12   busPIRone (BUSPAR) 5 MG tablet Take 1 tablet (5 mg total) by mouth 4 (four) times daily as needed. (Patient taking differently: Take 5 mg by mouth 3 (three) times daily.) 360 tablet 3   Carbidopa-Levodopa ER (SINEMET CR) 25-100 MG tablet controlled release Take 1 tablet by mouth daily. Dose will increase by 1 tablet each week until taking 3 times daily     fluticasone (FLONASE) 50 MCG/ACT nasal spray Place 1 spray into both nostrils as needed for allergies.     furosemide (LASIX) 20 MG tablet TAKE 1 TABLET BY MOUTH ONCE DAILY AS NEEDED FOR EDEMA (Patient taking differently: Take 20 mg by mouth as needed for edema or fluid.) 30 tablet 0   glipiZIDE (GLUCOTROL) 5 MG tablet Take 1 tablet (5 mg total) by mouth 2 (two) times daily before a meal. 60 tablet 3   LINZESS 145 MCG CAPS capsule Take 145 mcg by mouth daily.     loratadine (CLARITIN) 10 MG tablet Take 10 mg by mouth daily as needed for allergies.     losartan (COZAAR) 50 MG tablet TAKE 1 TABLET EVERY DAY (Patient taking differently: Take 50 mg by mouth daily.) 90 tablet 3   metFORMIN (GLUCOPHAGE-XR) 500 MG 24 hr tablet Take 1 tablet (500 mg total) by mouth 2 (two) times daily with a meal. 180 tablet 1   Misc. Devices (FLEX THERAPY) MISC by Does not apply  route.     mometasone (ELOCON) 0.1 % cream Apply 1 Application topically daily. 45 g 0   Multiple Vitamin (MULTIVITAMIN WITH MINERALS) TABS tablet Take 1 tablet by mouth daily.     Probiotic Product (PROBIOTIC PO) Take 2 capsules by mouth daily.     rOPINIRole (REQUIP) 2 MG tablet TAKE 1 TABLET THREE TIMES DAILY 270 tablet 3   tadalafil (CIALIS) 20 MG tablet TAKE 1/2 TO 1 (ONE-HALF TO ONE) TABLET BY MOUTH EVERY OTHER DAY FOR ERECTILE DYSFUNCTION (Patient taking differently: Take 10-20 mg by mouth every other day. FOR ERECTILE DYSFUNCTION) 30 tablet 0   tamsulosin (FLOMAX) 0.4 MG CAPS capsule Take 2  capsules (0.8 mg total) by mouth daily. 180 capsule 3   traMADol (ULTRAM) 50 MG tablet TAKE 1 TABLET BY MOUTH EVERY 8 HOURS AS NEEDED FOR MODERATE PAIN (Patient taking differently: Take 50 mg by mouth as needed for moderate pain (usually take on fridays).) 30 tablet 0   triamcinolone cream (KENALOG) 0.1 % Apply 1 Application topically as needed (flares).     VITAMIN D PO Take 2,000 Units by mouth daily.     benzonatate (TESSALON) 200 MG capsule Take 1 capsule (200 mg total) by mouth 2 (two) times daily as needed for cough. (Patient not taking: Reported on 03/12/2023) 20 capsule 0     Physical Exam: Blood pressure (!) 123/91, pulse 92, temperature 98.3 F (36.8 C), temperature source Oral, resp. rate 17, height 5\' 5"  (1.651 m), weight 118.6 kg, SpO2 94%. General: pleasant, WD/WN male who is laying in bed in NAD HEENT: head is normocephalic, atraumatic.  Heart: regular, rate, and rhythm.   Lungs: CTA b/l. Respiratory effort nonlabored Abd: Soft, mild distension, RUQ ttp with + Murphy's sign. +BS. Small umbilical hernia that is soft, nt, and without overlying skin changes.  MS: no BUE or BLE edema Skin: warm and dry  Psych: A&Ox4 with an appropriate affect Neuro: normal speech, thought process intact, moves all extremities, gait not assessed  Results for orders placed or performed during the  hospital encounter of 03/12/23 (from the past 48 hour(s))  Lipase, blood     Status: None   Collection Time: 03/12/23  7:48 AM  Result Value Ref Range   Lipase 46 11 - 51 U/L    Comment: Performed at [facility], [address] [place], [place] [address]  Comprehensive metabolic panel     Status: Abnormal   Collection Time: 03/12/23  7:48 AM  Result Value Ref Range   Sodium 141 135 - 145 mmol/L   Potassium 4.1 3.5 - 5.1 mmol/L   Chloride 106 98 - 111 mmol/L   CO2 26 22 - 32 mmol/L   Glucose, Bld 217 (H) 70 - 99 mg/dL    Comment: Glucose reference range applies only to samples taken after fasting for at least 8 hours.   BUN 12 8 - 23 mg/dL   Creatinine, Ser 6.21 0.61 - 1.24 mg/dL   Calcium 9.4 8.9 - 30.8 mg/dL   Total Protein 6.8 6.5 - 8.1 g/dL   Albumin 3.6 3.5 - 5.0 g/dL   AST 23 15 - 41 U/L   ALT 11 0 - 44 U/L   Alkaline Phosphatase 84 38 - 126 U/L   Total Bilirubin 1.8 (H) 0.3 - 1.2 mg/dL   GFR, Estimated >65 >78 mL/min    Comment: (NOTE) Calculated using the CKD-EPI Creatinine Equation (2021)    Anion gap 9 5 - 15    Comment: Performed at [facility], [address] [place], [place] [address]  CBC     Status: None   Collection Time: 03/12/23  7:48 AM  Result Value Ref Range   WBC 7.6 4.0 - 10.5 K/uL   RBC 5.00 4.22 - 5.81 MIL/uL   Hemoglobin 14.8 13.0 - 17.0 g/dL   HCT 95.2 84.1 - 32.4 %   MCV 87.8 80.0 - 100.0 fL   MCH 29.6 26.0 - 34.0 pg   MCHC 33.7 30.0 - 36.0 g/dL   RDW 40.1 02.7 - 25.3 %   Platelets 167 150 - 400 K/uL   nRBC 0.0 0.0 -  0.2 %    Comment: Performed at [facility], [address] [place], [place] [address]  Troponin I (High Sensitivity)     Status: None   Collection Time: 03/12/23  7:48 AM  Result Value Ref Range   Troponin I (High Sensitivity) 5 <18 ng/L    Comment: (NOTE) Elevated high sensitivity troponin I (hsTnI) values and significant  changes across serial measurements may suggest ACS but many other   chronic and acute conditions are known to elevate hsTnI results.  Refer to the "Links" section for chest pain algorithms and additional  guidance. Performed at [facility], [address] [place], [place] [address]   Urinalysis, Routine w reflex microscopic -Urine, Clean Catch     Status: Abnormal   Collection Time: 03/12/23  8:50 AM  Result Value Ref Range   Color, Urine YELLOW YELLOW   APPearance CLEAR CLEAR   Specific Gravity, Urine 1.025 1.005 - 1.030   pH 5.5 5.0 - 8.0   Glucose, UA 250 (A) NEGATIVE mg/dL   Hgb urine dipstick NEGATIVE NEGATIVE   Bilirubin Urine NEGATIVE NEGATIVE   Ketones, ur NEGATIVE NEGATIVE mg/dL   Protein, ur NEGATIVE NEGATIVE mg/dL   Nitrite NEGATIVE NEGATIVE   Leukocytes,Ua NEGATIVE NEGATIVE    Comment: Microscopic not done on urines with negative protein, blood, leukocytes, nitrite, or glucose < 500 mg/dL. Performed at [facility], [address] [place], [place] [address]   Glucose, capillary     Status: Abnormal   Collection Time: 03/12/23  9:53 PM  Result Value Ref Range   Glucose-Capillary 138 (H) 70 - 99 mg/dL    Comment: Glucose reference range applies only to samples taken after fasting for at least 8 hours.  Lactic acid, plasma     Status: None   Collection Time: 03/13/23 12:15 AM  Result Value Ref Range   Lactic Acid, Venous 1.2 0.5 - 1.9 mmol/L    Comment: Performed at [facility], [address], [place], [place] [address]  CK     Status: None   Collection Time: 03/13/23 12:15 AM  Result Value Ref Range   Total CK 74 49 - 397 U/L    Comment: Performed at [facility], [address] [place], [place] [address]  Glucose, capillary     Status: Abnormal   Collection Time: 03/13/23 12:17 AM  Result Value Ref Range   Glucose-Capillary 119 (H) 70 - 99 mg/dL    Comment: Glucose reference range applies only to samples taken after fasting for at least 8 hours.  Hemoglobin A1c      Status: Abnormal   Collection Time: 03/13/23 12:22 AM  Result Value Ref Range   Hgb A1c MFr Bld 6.7 (H) 4.8 - 5.6 %    Comment: (NOTE) Pre diabetes:          5.7%-6.4%  Diabetes:              >6.4%  Glycemic control for   <7.0% adults with diabetes    Mean Plasma Glucose 145.59 mg/dL    Comment: Performed at [facility], [address], [place], [place] [address]  Magnesium     Status: None   Collection Time: 03/13/23 12:22 AM  Result Value Ref Range   Magnesium 1.9 1.7 - 2.4 mg/dL    Comment: Performed at [facility], [address] [place], [place] [address]  Phosphorus  Status: None   Collection Time: 03/13/23 12:22 AM  Result Value Ref Range   Phosphorus 3.7 2.5 - 4.6 mg/dL    Comment: Performed at [facility], [address] [place], [place] [address]  Comprehensive metabolic panel     Status: Abnormal   Collection Time: 03/13/23 12:22 AM  Result Value Ref Range   Sodium 135 135 - 145 mmol/L   Potassium 3.5 3.5 - 5.1 mmol/L   Chloride 101 98 - 111 mmol/L   CO2 25 22 - 32 mmol/L   Glucose, Bld 122 (H) 70 - 99 mg/dL    Comment: Glucose reference range applies only to samples taken after fasting for at least 8 hours.   BUN 12 8 - 23 mg/dL   Creatinine, Ser 6.04 0.61 - 1.24 mg/dL   Calcium 8.4 (L) 8.9 - 10.3 mg/dL   Total Protein 6.1 (L) 6.5 - 8.1 g/dL   Albumin 3.3 (L) 3.5 - 5.0 g/dL   AST 33 15 - 41 U/L   ALT 17 0 - 44 U/L   Alkaline Phosphatase 50 38 - 126 U/L   Total Bilirubin 3.7 (H) 0.3 - 1.2 mg/dL   GFR, Estimated >54 >09 mL/min    Comment: (NOTE) Calculated using the CKD-EPI Creatinine Equation (2021)    Anion gap 9 5 - 15    Comment: Performed at [facility], [address], [place], [place] [address]  CBC     Status: None   Collection Time: 03/13/23 12:22 AM  Result Value Ref Range   WBC 9.5 4.0 - 10.5 K/uL   RBC 4.71 4.22 - 5.81 MIL/uL   Hemoglobin 14.0 13.0 - 17.0 g/dL   HCT  47.8 29.5 - 62.1 %   MCV 90.0 80.0 - 100.0 fL   MCH 29.7 26.0 - 34.0 pg   MCHC 33.0 30.0 - 36.0 g/dL   RDW 30.8 65.7 - 84.6 %   Platelets 164 150 - 400 K/uL   nRBC 0.0 0.0 - 0.2 %    Comment: Performed at [facility], [address] [place], [place] [address]  Lactic acid, plasma     Status: None   Collection Time: 03/13/23  4:23 AM  Result Value Ref Range   Lactic Acid, Venous 1.1 0.5 - 1.9 mmol/L    Comment: Performed at [facility], [address], [place], [place] [address]  Glucose, capillary     Status: Abnormal   Collection Time: 03/13/23  4:36 AM  Result Value Ref Range   Glucose-Capillary 132 (H) 70 - 99 mg/dL    Comment: Glucose reference range applies only to samples taken after fasting for at least 8 hours.   US Abdomen Limited RUQ (LIVER/GB)  Result Date: 03/12/2023 CLINICAL DATA:  Right upper quadrant pain, nausea, and vomiting. Findings concerning for acute cholecystitis on CT. EXAM: ULTRASOUND ABDOMEN LIMITED RIGHT UPPER QUADRANT COMPARISON:  CT abdomen and pelvis 03/12/2023 FINDINGS: Gallbladder: Multiple stones in the gallbladder. The margins of the gallbladder wall are not particularly well-defined on this ultrasound, however gallbladder wall thickening is suspected when correlating with the earlier CT. A positive sonographic Eulah Pont sign was reported by the sonographer. Common bile duct: Diameter: 3 mm Liver: Diffusely increased parenchymal echogenicity without a focal lesion identified. Portal vein is patent on color Doppler imaging with normal direction of blood flow towards the liver. Other: None. IMPRESSION: 1. Cholelithiasis with positive sonographic Murphy sign and suspected gallbladder wall thickening, concerning for acute  cholecystitis. 2. No biliary dilatation. 3. Hepatic steatosis. Electronically Signed   By: Sebastian Ache M.D.   On: 03/12/2023 11:18   CT ABDOMEN PELVIS W CONTRAST  Result Date: 03/12/2023 CLINICAL DATA:   Abdominal pain, acute, nonlocalized eval for SBO vs cholecystitis EXAM: CT ABDOMEN AND PELVIS WITH CONTRAST TECHNIQUE: Multidetector CT imaging of the abdomen and pelvis was performed using the standard protocol following bolus administration of intravenous contrast. RADIATION DOSE REDUCTION: This exam was performed according to the departmental dose-optimization program which includes automated exposure control, adjustment of the mA and/or kV according to patient size and/or use of iterative reconstruction technique. CONTRAST:  OMNIPAQUE IOHEXOL 300 MG/ML  SOLN COMPARISON:  CT urogram from 09/28/2009. FINDINGS: Lower chest: There are patchy atelectatic changes in the visualized lung bases. No overt consolidation. No pleural effusion. The heart is normal in size. No pericardial effusion. Hepatobiliary: The liver is normal in size. Non-cirrhotic configuration. No suspicious mass. These is mild diffuse hepatic steatosis. No intrahepatic or extrahepatic bile duct dilation. Gallbladder is distended measuring up to 4.2 cm in width. There is mild pericholecystic fat stranding. There are multiple layering gallstones in the neck region. There is a 3.5 x 5 mm stone which is outside the gallbladder lumen and likely within the cystic duct. In appropriate clinical setting, findings are highly concerning for acute cholecystitis. Correlate clinically to determine the need for additional imaging with nuclear medicine HIDA scan or ultrasound. Pancreas: Unremarkable. No pancreatic ductal dilatation or surrounding inflammatory changes. Spleen: Within normal limits. No focal lesion. Adrenals/Urinary Tract: Adrenal glands are unremarkable. No suspicious renal mass. No hydronephrosis. No renal or ureteric calculi. Unremarkable urinary bladder. Stomach/Bowel: No disproportionate dilation of the small or large bowel loops. No evidence of abnormal bowel wall thickening or inflammatory changes. The appendix is unremarkable. There are  scattered diverticula throughout the colon, without imaging signs of diverticulitis. Vascular/Lymphatic: No ascites or pneumoperitoneum. No abdominal or pelvic lymphadenopathy, by size criteria. No aneurysmal dilation of the major abdominal arteries. There are mild peripheral atherosclerotic vascular calcifications of the aorta and its major branches. Reproductive: Enlarged prostate. Symmetric seminal vesicles. Other: There are bilateral small fat containing inguinal hernias. Partially seen intramuscular lipoma in the right proximal sartorius muscle. Musculoskeletal: No suspicious osseous lesions. There are mild multilevel degenerative changes in the visualized spine. IMPRESSION: *Findings highly concerning for acute cholecystitis. Correlate clinically to determine the need for additional imaging with nuclear medicine HIDA scan or ultrasound. *Multiple other nonacute observations, as described above. Electronically Signed   By: Jules Schick M.D.   On: 03/12/2023 09:20    Anti-infectives (From admission, onward)    Start     Dose/Rate Route Frequency Ordered Stop   03/12/23 2000  piperacillin-tazobactam (ZOSYN) IVPB 3.375 g        3.375 g 12.5 mL/hr over 240 Minutes Intravenous Every 8 hours 03/12/23 1230     03/12/23 1245  piperacillin-tazobactam (ZOSYN) IVPB 3.375 g        3.375 g 100 mL/hr over 30 Minutes Intravenous  Once 03/12/23 1230 03/12/23 1325       Assessment/Plan Acute Cholecystitis   This is a 72 y.o. male who presented with generalized abdominal pain that has now localized to the RUQ. Imaging is concerning for Cholecystitis with Cholelithiasis with gallstones in the neck region on CT (possibly within cystic duct) and mild pericholecystic fat stranding + RUQ [place] with Cholelithiasis with + Murphy's sign. He has positive Murphy's sign on exam today as well. Since presentation  he has developed a fever to 101.8 and T. Bili has risen from 1.8 > 3.7. WBC, lactic, lipase, Alk Phos, AST/ALT and  Cr wnl. CBD 3mm on [place]. Discussed case with attending. Would recommend GI consult given rising T. Bili to ensure they do not have concern for Choledocholithiasis or developing Cholangitis.  Feel he would benefit from Cholecystectomy during admission but would like GI's input first. Did discuss with patient and family if he was to acutely worsen we would recommend percutaneous cholecystotomy tube but I do not think he needs that right now. Cont abx. Cont to trend labs. Keep NPO. We will follow with you.   FEN - NPO. IVF per TRH VTE - SCDs, okay for chem ppx from our standpoint but would await GI's input first.  ID - Zosyn   I reviewed nursing notes, last 24 h vitals and pain scores, last 48 h intake and output, last 24 h labs and trends, and last 24 h imaging results.  Jacinto Halim, [facility] 03/13/2023, 7:15 AM Please see Amion for pager number during day hours 7:00am-4:30pm

## 2023-03-13 NOTE — Progress Notes (Signed)
   03/13/23 2125  BiPAP/CPAP/SIPAP  BiPAP/CPAP/SIPAP Pt Type Adult  BiPAP/CPAP/SIPAP DREAMSTATIOND  Mask Type Nasal mask  Mask Size Medium  FiO2 (%) 21 %  Patient Home Equipment No  Auto Titrate Yes (5-20)  BiPAP/CPAP /SiPAP Vitals  Pulse Rate 84  Resp 18  SpO2 96 %  Bilateral Breath Sounds Clear;Diminished  MEWS Score/Color  MEWS Score 0  MEWS Score Color Jonathan Yu

## 2023-03-14 ENCOUNTER — Other Ambulatory Visit: Payer: Self-pay

## 2023-03-14 ENCOUNTER — Inpatient Hospital Stay (HOSPITAL_COMMUNITY): Payer: Medicare HMO | Admitting: Anesthesiology

## 2023-03-14 ENCOUNTER — Encounter (HOSPITAL_COMMUNITY): Payer: Self-pay | Admitting: Internal Medicine

## 2023-03-14 ENCOUNTER — Encounter (HOSPITAL_COMMUNITY)
Admission: EM | Disposition: A | Discharge status: Home / Self Care | Payer: Self-pay | Source: Home / Self Care | Attending: Internal Medicine

## 2023-03-14 DIAGNOSIS — E119 Type 2 diabetes mellitus without complications: Secondary | ICD-10-CM | POA: Diagnosis not present

## 2023-03-14 DIAGNOSIS — I10 Essential (primary) hypertension: Secondary | ICD-10-CM | POA: Diagnosis not present

## 2023-03-14 DIAGNOSIS — Z7984 Long term (current) use of oral hypoglycemic drugs: Secondary | ICD-10-CM

## 2023-03-14 DIAGNOSIS — K812 Acute cholecystitis with chronic cholecystitis: Secondary | ICD-10-CM | POA: Diagnosis not present

## 2023-03-14 DIAGNOSIS — K810 Acute cholecystitis: Secondary | ICD-10-CM | POA: Diagnosis not present

## 2023-03-14 HISTORY — PX: CHOLECYSTECTOMY: SHX55

## 2023-03-14 LAB — GLUCOSE, CAPILLARY
Glucose-Capillary: 101 mg/dL — ABNORMAL HIGH (ref 70–99)
Glucose-Capillary: 111 mg/dL — ABNORMAL HIGH (ref 70–99)
Glucose-Capillary: 114 mg/dL — ABNORMAL HIGH (ref 70–99)
Glucose-Capillary: 116 mg/dL — ABNORMAL HIGH (ref 70–99)
Glucose-Capillary: 119 mg/dL — ABNORMAL HIGH (ref 70–99)
Glucose-Capillary: 126 mg/dL — ABNORMAL HIGH (ref 70–99)
Glucose-Capillary: 257 mg/dL — ABNORMAL HIGH (ref 70–99)

## 2023-03-14 LAB — MRSA NEXT GEN BY PCR, NASAL: MRSA by PCR Next Gen: NOT DETECTED

## 2023-03-14 SURGERY — LAPAROSCOPIC CHOLECYSTECTOMY WITH INTRAOPERATIVE CHOLANGIOGRAM
Anesthesia: General | Site: Abdomen

## 2023-03-14 MED ORDER — ACETAMINOPHEN 500 MG PO TABS
1000.0000 mg | ORAL_TABLET | Freq: Three times a day (TID) | ORAL | Status: DC
  Administered 2023-03-14 – 2023-03-24 (×29): 1000 mg via ORAL
  Filled 2023-03-14 (×29): qty 2

## 2023-03-14 MED ORDER — INDOCYANINE GREEN 25 MG IV SOLR
INTRAVENOUS | Status: DC | PRN
  Administered 2023-03-14: 2.5 mg via INTRAVENOUS

## 2023-03-14 MED ORDER — 0.9 % SODIUM CHLORIDE (POUR BTL) OPTIME
TOPICAL | Status: DC | PRN
  Administered 2023-03-14: 1000 mL

## 2023-03-14 MED ORDER — HYDROMORPHONE HCL 1 MG/ML IJ SOLN
INTRAMUSCULAR | Status: AC
  Filled 2023-03-14: qty 1

## 2023-03-14 MED ORDER — BUPIVACAINE-EPINEPHRINE 0.25% -1:200000 IJ SOLN
INTRAMUSCULAR | Status: AC
  Filled 2023-03-14: qty 1

## 2023-03-14 MED ORDER — HYDROMORPHONE HCL 1 MG/ML IJ SOLN
0.2500 mg | INTRAMUSCULAR | Status: DC | PRN
  Administered 2023-03-14 (×4): 0.5 mg via INTRAVENOUS

## 2023-03-14 MED ORDER — PHENYLEPHRINE 80 MCG/ML (10ML) SYRINGE FOR IV PUSH (FOR BLOOD PRESSURE SUPPORT)
PREFILLED_SYRINGE | INTRAVENOUS | Status: AC
  Filled 2023-03-14: qty 10

## 2023-03-14 MED ORDER — LIDOCAINE HCL (PF) 2 % IJ SOLN
INTRAMUSCULAR | Status: AC
  Filled 2023-03-14: qty 5

## 2023-03-14 MED ORDER — LACTATED RINGERS IV SOLN
INTRAVENOUS | Status: DC | PRN
Start: 2023-03-14 — End: 2023-03-14

## 2023-03-14 MED ORDER — FENTANYL CITRATE (PF) 250 MCG/5ML IJ SOLN
INTRAMUSCULAR | Status: AC
  Filled 2023-03-14: qty 5

## 2023-03-14 MED ORDER — SODIUM CHLORIDE 0.9 % IR SOLN
Status: DC | PRN
  Administered 2023-03-14: 1000 mL

## 2023-03-14 MED ORDER — ONDANSETRON HCL 4 MG/2ML IJ SOLN
INTRAMUSCULAR | Status: DC | PRN
  Administered 2023-03-14: 4 mg via INTRAVENOUS

## 2023-03-14 MED ORDER — SUGAMMADEX SODIUM 200 MG/2ML IV SOLN
INTRAVENOUS | Status: DC | PRN
  Administered 2023-03-14: 300 mg via INTRAVENOUS

## 2023-03-14 MED ORDER — IBUPROFEN 400 MG PO TABS: 400.0000 mg | ORAL_TABLET | Freq: Four times a day (QID) | ORAL | Status: DC | PRN

## 2023-03-14 MED ORDER — SODIUM CHLORIDE (PF) 0.9 % IJ SOLN
INTRAMUSCULAR | Status: AC
  Filled 2023-03-14: qty 20

## 2023-03-14 MED ORDER — ROCURONIUM BROMIDE 10 MG/ML (PF) SYRINGE
PREFILLED_SYRINGE | INTRAVENOUS | Status: AC
  Filled 2023-03-14: qty 10

## 2023-03-14 MED ORDER — BUPIVACAINE LIPOSOME 1.3 % IJ SUSP
INTRAMUSCULAR | Status: AC
  Filled 2023-03-14: qty 20

## 2023-03-14 MED ORDER — ORAL CARE MOUTH RINSE: 15.0000 mL | Freq: Once | OROMUCOSAL | Status: AC

## 2023-03-14 MED ORDER — ONDANSETRON HCL 4 MG/2ML IJ SOLN
INTRAMUSCULAR | Status: AC
  Filled 2023-03-14: qty 2

## 2023-03-14 MED ORDER — PROPOFOL 10 MG/ML IV BOLUS
INTRAVENOUS | Status: DC | PRN
Start: 2023-03-14 — End: 2023-03-14
  Administered 2023-03-14: 150 mg via INTRAVENOUS

## 2023-03-14 MED ORDER — DEXAMETHASONE SODIUM PHOSPHATE 10 MG/ML IJ SOLN
INTRAMUSCULAR | Status: DC | PRN
  Administered 2023-03-14: 10 mg via INTRAVENOUS

## 2023-03-14 MED ORDER — PROPOFOL 10 MG/ML IV BOLUS
INTRAVENOUS | Status: AC
  Filled 2023-03-14: qty 20

## 2023-03-14 MED ORDER — LIDOCAINE 2% (20 MG/ML) 5 ML SYRINGE
INTRAMUSCULAR | Status: DC | PRN
  Administered 2023-03-14: 100 mg via INTRAVENOUS

## 2023-03-14 MED ORDER — SODIUM CHLORIDE (PF) 0.9 % IJ SOLN
INTRAMUSCULAR | Status: AC
  Filled 2023-03-14: qty 10

## 2023-03-14 MED ORDER — ROCURONIUM BROMIDE 10 MG/ML (PF) SYRINGE
PREFILLED_SYRINGE | INTRAVENOUS | Status: DC | PRN
  Administered 2023-03-14: 10 mg via INTRAVENOUS
  Administered 2023-03-14: 50 mg via INTRAVENOUS
  Administered 2023-03-14: 20 mg via INTRAVENOUS

## 2023-03-14 MED ORDER — BUPIVACAINE-EPINEPHRINE 0.25% -1:200000 IJ SOLN
INTRAMUSCULAR | Status: DC | PRN
  Administered 2023-03-14: 30 mL

## 2023-03-14 MED ORDER — DEXAMETHASONE SODIUM PHOSPHATE 10 MG/ML IJ SOLN
INTRAMUSCULAR | Status: AC
  Filled 2023-03-14: qty 1

## 2023-03-14 MED ORDER — BUPIVACAINE LIPOSOME 1.3 % IJ SUSP
INTRAMUSCULAR | Status: DC | PRN
  Administered 2023-03-14: 20 mL

## 2023-03-14 MED ORDER — AMISULPRIDE (ANTIEMETIC) 5 MG/2ML IV SOLN: 10.0000 mg | Freq: Once | INTRAVENOUS | Status: DC | PRN

## 2023-03-14 MED ORDER — FENTANYL CITRATE (PF) 250 MCG/5ML IJ SOLN
INTRAMUSCULAR | Status: DC | PRN
  Administered 2023-03-14 (×3): 50 ug via INTRAVENOUS

## 2023-03-14 MED ORDER — TRAMADOL HCL 50 MG PO TABS: 50.0000 mg | ORAL_TABLET | Freq: Four times a day (QID) | ORAL | Status: DC | PRN

## 2023-03-14 MED ORDER — LIDOCAINE 5 % EX PTCH
1.0000 | MEDICATED_PATCH | CUTANEOUS | Status: DC
  Administered 2023-03-14 – 2023-03-23 (×9): 1 via TRANSDERMAL
  Filled 2023-03-14 (×10): qty 1

## 2023-03-14 MED ORDER — CHLORHEXIDINE GLUCONATE 0.12 % MT SOLN
15.0000 mL | Freq: Once | OROMUCOSAL | Status: AC
  Administered 2023-03-14: 15 mL via OROMUCOSAL

## 2023-03-14 MED ORDER — PHENYLEPHRINE 80 MCG/ML (10ML) SYRINGE FOR IV PUSH (FOR BLOOD PRESSURE SUPPORT)
PREFILLED_SYRINGE | INTRAVENOUS | Status: DC | PRN
Start: 2023-03-14 — End: 2023-03-14
  Administered 2023-03-14: 160 ug via INTRAVENOUS

## 2023-03-14 MED ORDER — SUCCINYLCHOLINE CHLORIDE 200 MG/10ML IV SOSY
PREFILLED_SYRINGE | INTRAVENOUS | Status: DC | PRN
  Administered 2023-03-14: 140 mg via INTRAVENOUS

## 2023-03-14 MED ORDER — LACTATED RINGERS IV SOLN: INTRAVENOUS | Status: DC

## 2023-03-14 SURGICAL SUPPLY — 52 items
ADH SKN CLS APL DERMABOND .7 (GAUZE/BANDAGES/DRESSINGS) ×1
APL PRP STRL LF DISP 70% ISPRP (MISCELLANEOUS) ×1
APL SRG 38 LTWT LNG FL B (MISCELLANEOUS)
APPLICATOR ARISTA FLEXITIP XL (MISCELLANEOUS) IMPLANT
APPLIER CLIP 5 13 M/L LIGAMAX5 (MISCELLANEOUS) ×1
APPLIER CLIP ROT 10 11.4 M/L (STAPLE)
APR CLP MED LRG 11.4X10 (STAPLE)
APR CLP MED LRG 5 ANG JAW (MISCELLANEOUS) ×1
BAG COUNTER SPONGE SURGICOUNT (BAG) IMPLANT
BAG SPNG CNTER NS LX DISP (BAG)
CABLE HIGH FREQUENCY MONO STRZ (ELECTRODE) ×2 IMPLANT
CHLORAPREP W/TINT 26 (MISCELLANEOUS) ×2 IMPLANT
CLIP APPLIE 5 13 M/L LIGAMAX5 (MISCELLANEOUS) ×2 IMPLANT
CLIP APPLIE ROT 10 11.4 M/L (STAPLE) IMPLANT
COVER MAYO STAND XLG (MISCELLANEOUS) ×2 IMPLANT
COVER SURGICAL LIGHT HANDLE (MISCELLANEOUS) ×2 IMPLANT
DERMABOND ADVANCED .7 DNX12 (GAUZE/BANDAGES/DRESSINGS) ×2 IMPLANT
DISSECTOR BLUNT TIP ENDO 5MM (MISCELLANEOUS) IMPLANT
DRAIN RELI 100 BL SUC LF ST (DRAIN) ×1
DRAPE C-ARM 42X120 X-RAY (DRAPES) IMPLANT
ELECT PENCIL ROCKER SW 15FT (MISCELLANEOUS) ×2 IMPLANT
ELECT REM PT RETURN 15FT ADLT (MISCELLANEOUS) ×2 IMPLANT
EVACUATOR SILICONE 100CC (DRAIN) IMPLANT
GLOVE BIO SURGEON STRL SZ7.5 (GLOVE) ×2 IMPLANT
GLOVE INDICATOR 8.0 STRL GRN (GLOVE) ×2 IMPLANT
GOWN STRL REUS W/ TWL XL LVL3 (GOWN DISPOSABLE) ×4 IMPLANT
GOWN STRL REUS W/TWL XL LVL3 (GOWN DISPOSABLE) ×2
GRASPER SUT TROCAR 14GX15 (MISCELLANEOUS) IMPLANT
HEMOSTAT ARISTA ABSORB 3G PWDR (HEMOSTASIS) IMPLANT
HEMOSTAT SNOW SURGICEL 2X4 (HEMOSTASIS) IMPLANT
IRRIG SUCT STRYKERFLOW 2 WTIP (MISCELLANEOUS) ×1
IRRIGATION SUCT STRKRFLW 2 WTP (MISCELLANEOUS) ×2 IMPLANT
KIT BASIN OR (CUSTOM PROCEDURE TRAY) ×2 IMPLANT
KIT TURNOVER KIT A (KITS) IMPLANT
NDL INSUFFLATION 14GA 120MM (NEEDLE) IMPLANT
NEEDLE INSUFFLATION 14GA 120MM (NEEDLE) IMPLANT
SCISSORS LAP 5X35 DISP (ENDOMECHANICALS) ×2 IMPLANT
SET CHOLANGIOGRAPH MIX (MISCELLANEOUS) IMPLANT
SET TUBE SMOKE EVAC HIGH FLOW (TUBING) ×2 IMPLANT
SHEARS HARMONIC ACE PLUS 36CM (ENDOMECHANICALS) IMPLANT
SLEEVE ADV FIXATION 5X100MM (TROCAR) ×4 IMPLANT
SPIKE FLUID TRANSFER (MISCELLANEOUS) ×2 IMPLANT
SUT ETHILON 2 0 PS N (SUTURE) IMPLANT
SUT MNCRL AB 4-0 PS2 18 (SUTURE) ×2 IMPLANT
SYR 20ML ECCENTRIC (SYRINGE) ×2 IMPLANT
SYS BAG RETRIEVAL 10MM (BASKET) ×1
SYSTEM BAG RETRIEVAL 10MM (BASKET) ×2 IMPLANT
TOWEL OR 17X26 10 PK STRL BLUE (TOWEL DISPOSABLE) ×2 IMPLANT
TOWEL OR NON WOVEN STRL DISP B (DISPOSABLE) IMPLANT
TRAY LAPAROSCOPIC (CUSTOM PROCEDURE TRAY) ×2 IMPLANT
TROCAR ADV FIXATION 5X100MM (TROCAR) ×2 IMPLANT
TROCAR BALLN 12MMX100 BLUNT (TROCAR) ×2 IMPLANT

## 2023-03-14 NOTE — Op Note (Signed)
03/12/2023 - 03/14/2023 3:53 PM  PATIENT: Jonathan Yu  72 y.o. male  Patient Care Team: Everrett Coombe, DO as PCP - General (Family Medicine) Odella Aquas, MD as Referring Physician (Urology) Nadara Mustard, MD as Consulting Physician (Orthopedic Surgery)  PRE-OPERATIVE DIAGNOSIS: Acute cholecystitis  POST-OPERATIVE DIAGNOSIS: Acute on chronic cholecystitis  PROCEDURE: Laparoscopic subtotal cholecystectomy  SURGEON: Marin Olp, MD  ASSISTANT: OR staff  ANESTHESIA: General endotracheal  EBL: 20 mL  DRAINS: None  SPECIMEN: Gallbladder wall  COUNTS: Sponge, needle and instrument counts were reported correct x2 at the conclusion of the operation  DISPOSITION: PACU in satisfactory condition  COMPLICATIONS: None  FINDINGS: Acute on chronic gangrenous cholecystitis.  Dense fibrotic type tissue over much of the wall of the gallbladder including all the way down near the infundibulum.  It was felt that attempts at dissection in this region would carry an increased risk for injury to surrounding structures given the difficulty with which we encountered in attempts at dissection.  Therefore, we opted to proceed with a subtotal cholecystectomy.  The anterior wall of the gallbladder was removed and the posterior wall was fulgurated.  A 19 French round Blake drain was left at conclusion.  DESCRIPTION:   The patient was identified & brought into the operating room.  He was then positioned supine on the OR table. SCDs were in place and active during the entire case.  The then underwent general endotracheal anesthesia. Pressure points were padded. Hair on the abdomen was clipped by the OR team. The abdomen was prepped and draped in the standard sterile fashion. Antibiotics were administered. A surgical timeout was performed and confirmed our plan.   A periumbilical incision was made. The umbilical stalk was grasped and retracted outwardly. The supraumbilical fascia was identified and  incised. The peritoneal cavity was gently entered bluntly. A purse-string 0 Vicryl suture was placed. The Hasson cannula was inserted into the peritoneal cavity and insufflation with CO2 commenced to . A laparoscope was inserted into the peritoneal cavity and inspection confirmed no evidence of trocar site complications. The patient was then positioned in reverse Trendelenburg with slight left side down. 3 additional 5 mm trocars were placed along the right subcostal line - one 5 mm port in mid subcostal region, another 5mm port in the right flank near the anterior axillary line, and a third 5mm port in the left subxiphoid region obliquely near the falciform ligament.  Liver appears grossly normal.  The gallbladder is not visible on initial inspection.  There is omentum with fibrotic type adhesions to much of his right liver.  We began by taking these down with a combination of blunt dissection and electrocautery.  Ultimately, were able to visualize the dome of the gallbladder.  This has patchy gangrenous type changes to the wall.  This is tensely distended.  There are facilitate retraction of this we used a laparoscopic Nezhat to aspirate the gallbladder.  There is thick [employer] type bile within the gallbladder.  Were then able to grasp the gallbladder and lifted anteriorly.  Additional omental adhesions were taken down from the wall of the gallbladder.  Working down towards the infundibulum, all adhesions were freed.  The infundibulum then grasped and retracted laterally.  The peritoneum over the gallbladder is incised.  There is dense fibrotic tissue underneath the peritoneum over much of the infundibular portion of the gallbladder.  There is acute on chronic type inflammation in this region with woody edema that is highly vascular.  It was felt that  further attempts to define critical anatomy would result in increased risk of injury to vascular and biliary structures.  Therefore, we opted to proceed the  subtotal cholecystectomy.  Staying high up on the dome of the gallbladder, the gallbladder was opened with harmonic scalpel.  Contents of the gallbladder evacuated with the suction irrigator device.  Small stones were also encountered and removed.  The anterior wall of the gallbladder was taken off down towards the infundibulum but again staying above the actual infundibulum proper to avoid any vascular or biliary structures.  The wall of the gallbladder was removed and submitted as specimen after being placed into an Endo Catch bag.  The right upper quadrant is irrigated.  We removed all visible free-floating stone fragments.  The posterior wall of the gallbladder is then fulgurated with electrocautery.  Hemostasis is then verified.  A 19 French round Blake drain is then placed through our right lateralmost trocar site and left drain in the gallbladder fossa.  This was secured to the skin with a 2-0 nylon stitch.  The right upper quadrant was again irrigated.  There were no visible remaining stone fragments within or around the gallbladder.  The drain is in good position.  The right upper quadrant trocars were removed under direct visualization noted to be hemostatic.  The Hassan cannula was then removed.  The 0 Vicryl pursestring stitch is tied.  The fascial closure was palpated and noted to be completely closed the defect obliterated.  All sponge, needle, and instrument counts were reported correct.  The skin of all incision sites was closed with 4-0 Monocryl subcuticular sutures.  The abdomen is washed and dried.  Dermabond is applied.  The drain is attached to bulb suction.  He was then awakened from anesthesia, extubated, and transferred to a stretcher for transport to PACU in satisfactory condition.

## 2023-03-14 NOTE — Plan of Care (Signed)
  Problem: Education: Goal: Knowledge of General Education information will improve Description: Including pain rating scale, medication(s)/side effects and non-pharmacologic comfort measures Outcome: Progressing   Problem: Nutrition: Goal: Adequate nutrition will be maintained Outcome: Progressing   Problem: Skin Integrity: Goal: Risk for impaired skin integrity will decrease Outcome: Progressing   Problem: Coping: Goal: Ability to adjust to condition or change in health will improve Outcome: Progressing

## 2023-03-14 NOTE — Transfer of Care (Signed)
Immediate Anesthesia Transfer of Care Note  Patient: Jonathan Yu  Procedure(s) Performed: LAPAROSCOPIC SUBTOTAL CHOLECYSTECTOMY WITH ICG DYE (Abdomen)  Patient Location: PACU  Anesthesia Type:General  Level of Consciousness: awake and alert   Airway & Oxygen Therapy: Patient Spontanous Breathing and Patient connected to face mask oxygen  Post-op Assessment: Report given to RN and Post -op Vital signs reviewed and stable  Post vital signs: Reviewed and stable  Last Vitals:  Vitals Value Taken Time  BP    Temp    Pulse 86 03/14/23 1613  Resp 18 03/14/23 1613  SpO2 100 % 03/14/23 1613  Vitals shown include unfiled device data.  Last Pain:  Vitals:   03/14/23 1327  TempSrc: Oral  PainSc:       Patients Stated Pain Goal: 2 (03/13/23 2222)  Complications:  Encounter Notable Events  Notable Event Outcome Phase Comment  Difficult to intubate - expected  Intraprocedure Filed from anesthesia note documentation.

## 2023-03-14 NOTE — Anesthesia Preprocedure Evaluation (Addendum)
Anesthesia Evaluation  Patient identified by MRN, date of birth, ID band Patient awake    Reviewed: Allergy & Precautions, NPO status , Patient's Chart, lab work & pertinent test results  History of Anesthesia Complications Negative for: history of anesthetic complications  Airway Mallampati: III  TM Distance: >3 FB Neck ROM: Full   Comment: Previous grade III view, easy mask Dental  (+) Dental Advisory Given   Pulmonary neg shortness of breath, sleep apnea and Continuous Positive Airway Pressure Ventilation , neg COPD, neg recent URI, Current Smoker   breath sounds clear to auscultation       Cardiovascular hypertension (losartan), Pt. on medications (-) angina (-) Past MI, (-) Cardiac Stents and (-) CABG (-) dysrhythmias  Rhythm:Regular Rate:Normal  HLD   Neuro/Psych neg Seizures PSYCHIATRIC DISORDERS Anxiety      Neuromuscular disease (DDD, Parkinson's disease, lumbar radiculopathy)    GI/Hepatic Neg liver ROS,GERD  ,,Acute cholecystitis   Endo/Other  diabetes, Type 2, Oral Hypoglycemic Agents  Morbid obesity  Renal/GU negative Renal ROS     Musculoskeletal  (+) Arthritis , Osteoarthritis,    Abdominal  (+) + obese  Peds  Hematology negative hematology ROS (+) Lab Results      Component                Value               Date                      WBC                      7.5                 03/14/2023                HGB                      14.0                03/14/2023                HCT                      42.4                03/14/2023                MCV                      91.0                03/14/2023                PLT                      165                 03/14/2023              Anesthesia Other Findings   Reproductive/Obstetrics                             Anesthesia Physical Anesthesia Plan  ASA: 3  Anesthesia Plan: General   Post-op Pain Management:     Induction: Intravenous and Rapid sequence  PONV Risk Score and Plan: 1  and Ondansetron, Dexamethasone and Treatment may vary due to age or medical condition  Airway Management Planned: Oral ETT and Video Laryngoscope Planned  Additional Equipment:   Intra-op Plan:   Post-operative Plan: Extubation in OR  Informed Consent: I have reviewed the patients History and Physical, chart, labs and discussed the procedure including the risks, benefits and alternatives for the proposed anesthesia with the patient or authorized representative who has indicated his/her understanding and acceptance.     Dental advisory given  Plan Discussed with: Anesthesiologist and CRNA  Anesthesia Plan Comments: (Risks of general anesthesia discussed including, but not limited to, sore throat, hoarse voice, chipped/damaged teeth, injury to vocal cords, nausea and vomiting, allergic reactions, lung infection, heart attack, stroke, and death. All questions answered. )        Anesthesia Quick Evaluation

## 2023-03-14 NOTE — Progress Notes (Signed)
Subjective No acute events. Mild RUQ pain. No n/v. No fever/chills  Objective: Vital signs in last 24 hours: Temp:  [97.8 F (36.6 C)-98.4 F (36.9 C)] 98.1 F (36.7 C) (08/02 0528) Pulse Rate:  [65-88] 82 (08/02 0528) Resp:  [16-18] 18 (08/02 0528) BP: (126-156)/(61-83) 156/83 (08/02 0528) SpO2:  [90 %-97 %] 92 % (08/02 0528) FiO2 (%):  [21 %] 21 % (08/01 2125) Last BM Date : 03/11/23  Intake/Output from previous day: 08/01 0701 - 08/02 0700 In: 1114.7 [I.V.:903.3; IV Piggyback:211.5] Out: 1750 [Urine:1750] Intake/Output this shift: No intake/output data recorded.  Gen: NAD, comfortable CV: RRR Pulm: Normal work of breathing Abd: Soft, mildly ttp in RUQ; no rebound nor guarding. Nondistended. Ext: SCDs in place  Lab Results: CBC  Recent Labs    03/13/23 0022 03/14/23 0416  WBC 9.5 7.5  HGB 14.0 14.0  HCT 42.4 42.4  PLT 164 165   BMET Recent Labs    03/13/23 0022 03/14/23 0416  NA 135 136  K 3.5 3.7  CL 101 101  CO2 25 27  GLUCOSE 122* 100*  BUN 12 12  CREATININE 0.92 0.76  CALCIUM 8.4* 8.4*   PT/INR No results for input(s): "LABPROT", "INR" in the last 72 hours. ABG No results for input(s): "PHART", "HCO3" in the last 72 hours.  Invalid input(s): "PCO2", "PO2"  Studies/Results:  Anti-infectives: Anti-infectives (From admission, onward)    Start     Dose/Rate Route Frequency Ordered Stop   03/12/23 2000  piperacillin-tazobactam (ZOSYN) IVPB 3.375 g        3.375 g 12.5 mL/hr over 240 Minutes Intravenous Every 8 hours 03/12/23 1230     03/12/23 1245  piperacillin-tazobactam (ZOSYN) IVPB 3.375 g        3.375 g 100 mL/hr over 30 Minutes Intravenous  Once 03/12/23 1230 03/12/23 1325        Assessment/Plan: Patient Active Problem List   Diagnosis Date Noted   Acute cholecystitis 03/12/2023   Primary osteoarthritis, left elbow 02/10/2023   Cyst of skin 12/26/2022   Elevated bilirubin 07/25/2022   Atopic dermatitis 07/15/2022   Acute  cough 06/23/2022   Plantar fasciitis of left foot 01/16/2022   Left leg swelling 01/29/2021   Parkinsonism 01/29/2021   Joint pain 09/14/2020   Pharyngitis 08/01/2020   Insomnia 08/01/2020   Seborrheic keratoses 03/16/2020   Venous stasis dermatitis of both lower extremities 09/03/2019   Eustachian tube dysfunction, bilateral 08/16/2019   Anxiety state 08/16/2019   History of 2019 novel coronavirus disease (COVID-19) 08/16/2019   COVID-19 virus infection 08/07/2019   Hypogonadism in male 01/14/2018   Complete tear of left rotator cuff 10/02/2017   Chronic left shoulder pain 10/02/2017   Acute pain of left shoulder 09/22/2017   GERD (gastroesophageal reflux disease) 07/02/2017   Rosacea 05/29/2017   Unilateral primary osteoarthritis, left knee 09/12/2016   Idiopathic chronic venous hypertension of both lower extremities with inflammation 09/12/2016   Muscle spasm 03/07/2016   Chronic bilateral ankle plain, right total ankle arthroplasty, left ankle posttraumatic osteoarthritis 08/18/2013   ED (erectile dysfunction) 09/10/2011   BPH (benign prostatic hyperplasia) 09/10/2011   Kidney stones 01/28/2011   Type 2 diabetes mellitus without complication, without long-term current use of insulin (HCC) 01/24/2011   OSA on CPAP 01/24/2011   Right lumbar radiculopathy 01/24/2011   Essential hypertension, benign 12/20/2010   Dyslipidemia 12/20/2010   72yoM with parkinson's, htn, hld, osa on cpap, DM2  acute cholecystitis  -NPO, MIVF, IV zosyn -The anatomy and  physiology of the hepatobiliary system was discussed with him. The pathophysiology of gallbladder disease was then reviewed as well. -The options for treatment were discussed - including percutaneous drainage vs surgery (laparoscopic cholecystectomy, possible intraoperative cholangiogram). We discussed expectations therein including that if surgery were pursued, possibility of subtotal cholecystectomy and drain placement, potential for  ERCP following if bile leak, etc. -The procedure, material risks (including, but not limited to, pain, bleeding, infection, scarring, need for blood transfusion, damage to surrounding structures- blood vessels/nerves/viscus/organs, damage to bile duct, bile leak, chronic diarrhea, conversion to a 'subtotal' cholecystectomy and general expectations therein, post-cholecystectomy diarrhea, potential need for additional procedures including EGD/ERCP, hernia, worsening of pre-existing medical conditions, pancreatitis, pneumonia, heart attack, stroke, death) benefits and alternatives to surgery were discussed at length. The patient's questions were answered to his satisfaction, he voiced understanding and after reviewing options, has elected to proceed with surgery. Additionally, we discussed typical postoperative expectations and the recovery process.   LOS: 2 days   I spent a total of 50 minutes in both face-to-face and non-face-to-face activities, excluding procedures performed, for this visit on the date of this encounter.  Marin Olp, MD [facility], [facility]

## 2023-03-14 NOTE — Anesthesia Procedure Notes (Signed)
Procedure Name: Intubation Date/Time: 03/14/2023 2:23 PM  Performed by: Florene Route, CRNAPre-anesthesia Checklist: Patient identified, Emergency Drugs available, Suction available and Patient being monitored Patient Re-evaluated:Patient Re-evaluated prior to induction Oxygen Delivery Method: Circle system utilized Preoxygenation: Pre-oxygenation with 100% oxygen Induction Type: IV induction Ventilation: Mask ventilation with difficulty, Two handed mask ventilation required and Oral airway inserted - appropriate to patient size Laryngoscope Size: Glidescope and 4 Grade View: Grade I Tube type: Parker flex tip Tube size: 7.5 mm Number of attempts: 1 Airway Equipment and Method: Oral airway, Rigid stylet and Video-laryngoscopy Placement Confirmation: ETT inserted through vocal cords under direct vision, positive ETCO2 and breath sounds checked- equal and bilateral Secured at: 22 cm Tube secured with: Tape Dental Injury: Teeth and Oropharynx as per pre-operative assessment  Difficulty Due To: Difficulty was anticipated, Difficult Airway- due to large tongue, Difficult Airway- due to reduced neck mobility and Difficult Airway- due to limited oral opening Future Recommendations: Recommend- induction with short-acting agent, and alternative techniques readily available

## 2023-03-14 NOTE — Progress Notes (Signed)
[facility] Pre-op Review  Pre-op checklist: asked floor RN to complete  NPO: since MN 8/2  Labs: WNL  Consent: no order, please obtain in SS  H&P: completed  Vitals: WNL  CBG: 0735 114  O2 requirements: RA 92%-wears cpap at night  MAR/PTA review: completed Zosyn due at 1200noon  IV: 20 RAC  Floor nurse name:  [place], LPN  Additional info: NA

## 2023-03-14 NOTE — Progress Notes (Signed)
PROGRESS NOTE    Jonathan Yu  ZOX:[id number] DOB: January 27, 1951 DOA: 03/12/2023 PCP: Everrett Coombe, DO    Brief Narrative:   Jonathan Yu is a 72 y.o. male with past medical history significant for HTN, HLD, type 2 diabetes mellitus, OSA, Parkinson's disease, hypogonadism who presented to [facility] ED with complaints of abdominal pain.  Patient reports he was eating fried chicken livers at a restaurant night prior and felt some stomach upset in which he took Pepcid.  No relief with medication and start developing more severe constant pain to his epigastric region associated with nausea.  Initially thought was due to food poisoning but given his progressive symptoms he sought further care in the ED.  In the ED, temperature 101.8 F, HR 63, RR 20, BP 168/85, SpO2 96% on room air.  WBC 7.6, hemoglobin 14.8, platelets 167.  Sodium 141, potassium 4.1, chloride 106, CO2 26, glucose 217, BUN 12, creatinine 0.98.  AST 23, ALT 11, total bili 1.8.  Lipase 46.  High sensitive troponin 5.  Lactic acid 1.2.  Urinalysis unrevealing.  CT abdomen/pelvis with contrast with findings highly concerning for acute cholecystitis.  Ultrasound abdomen with cholelithiasis with positive sonographic Murphy sign, suspected gallbladder wall thickening, no biliary dilation, hepatic steatosis.  General surgery was consulted.  Patient was started on empiric antibiotics.  TRH consulted for admission and patient was transferred to [facility] for further evaluation management for concern of acute cholecystitis.  Assessment & Plan:   Acute cholecystitis Patient presenting to the ED with acute onset epigastric pain associated with fever and nausea.  WBC count 7.6. CT abdomen/pelvis with contrast with findings highly concerning for acute cholecystitis.  Ultrasound abdomen with cholelithiasis with positive sonographic Murphy sign, suspected gallbladder wall thickening, no biliary dilation.  MRCP  With numerous calls   Wound/blood with  And gallbladder are,  Gallbladder wall thickening without biliary ductal dilation or choledocholithiasis; consistent with acute cholecystitis. -- General surgery following, appreciate assistance -- WBC 7.6>9.5>7.5 -- Zosyn 3.375 g IV every 8 hours -- NS at 75 mL/h -- Dilaudid 0.5-1 mg IV every 4 hours as needed pain control -- CBC daily -- N.p.o., plan laparoscopic cholecystectomy this afternoon  Elevated total bilirubin Etiology likely secondary to acute cholecystitis; no biliary ductal dilation or choledocholithiasis noted on MRCP. -- CMP daily  Essential hypertension Home regimen includes losartan 50 mg p.o. daily, furosemide 20 mg p.o. daily. -- Hold oral hypertensives for now  Hyperlipidemia -- Holding home atorvastatin 40 mg p.o. daily  Type 2 diabetes mellitus Home regimen includes metformin 500 mg p.o. twice daily, glipizide 5 mg p.o. twice daily.  Hemoglobin A1c 6.7, well-controlled. -- Hold oral diabetic medications -- Sensitive SSI for coverage -- CBGs every 4 hours while NPO  Parkinson's disease Home regimen includes Requip 2 mg 3 times daily, Sinemet, BuSpar. -- Continue Sinemet  OSA -- Nocturnal CPAP  Morbid obesity Body mass index is 43.52 kg/m.  Discussed with patient needs for aggressive lifestyle changes/weight loss as this complicates all facets of care.  Outpatient follow-up with PCP.     DVT prophylaxis: SCDs Start: 03/12/23 2042    Code Status: Full Code Family Communication: No family present at bedside this morning  Disposition Plan:  Level of care: Med-Surg Status is: Inpatient Remains inpatient appropriate because: Pending laparoscopic cholecystectomy today, further per general surgery    Consultants:  General surgery Discussed with [facility], Dr. Marca Ancona on 8/1  Procedures:  None  Antimicrobials:  Zosyn 7/31  Subjective: Patient seen and examined at bedside, resting calmly.  RN present at bedside.  No specific  complaints this morning, abdominal pain much improved.  Discussed with general surgery, pending laparoscopic cholecystectomy this afternoon.  Denies headache, no dizziness, no chest pain, no palpitations, no shortness of breath, no cough/congestion, no fever/chills/night sweats, no current nausea/vomiting, no diarrhea, no focal weakness, no fatigue, no paresthesias.  No acute events overnight per nursing staff.   Objective: Vitals:   03/13/23 1840 03/13/23 2030 03/13/23 2125 03/14/23 0528  BP: (!) 146/77 (!) 143/80  (!) 156/83  Pulse: 87 83 84 82  Resp: 16 18 18 18   Temp: 98.2 F (36.8 C) 97.8 F (36.6 C)  98.1 F (36.7 C)  TempSrc: Oral Oral  Oral  SpO2: 90% 93% 96% 92%  Weight:      Height:        Intake/Output Summary (Last 24 hours) at 03/14/2023 1134 Last data filed at 03/14/2023 4782 Gross per 24 hour  Intake 1114.74 ml  Output 2500 ml  Net -1385.26 ml   Filed Weights   03/12/23 0738  Weight: 118.6 kg    Examination:  Physical Exam: GEN: NAD, alert and oriented x 3, obese HEENT: NCAT, PERRL, EOMI, sclera clear, MMM PULM: CTAB w/o wheezes/crackles, normal respiratory effort, on room air CV: RRR w/o M/G/R GI: abd soft, nondistended, NTTP, + BS, no R/G/M MSK: no peripheral edema, muscle strength globally intact 5/5 bilateral upper/lower extremities NEURO: CN II-XII intact, no focal deficits, sensation to light touch intact PSYCH: normal mood/affect Integumentary: dry/intact, no rashes or wounds    Data Reviewed: I have personally reviewed following labs and imaging studies  CBC: Recent Labs  Lab 03/12/23 0748 03/13/23 0022 03/14/23 0416  WBC 7.6 9.5 7.5  HGB 14.8 14.0 14.0  HCT 43.9 42.4 42.4  MCV 87.8 90.0 91.0  PLT 167 164 165   Basic Metabolic Panel: Recent Labs  Lab 03/12/23 0748 03/13/23 0022 03/14/23 0416  NA 141 135 136  K 4.1 3.5 3.7  CL 106 101 101  CO2 26 25 27   GLUCOSE 217* 122* 100*  BUN 12 12 12   CREATININE 0.98 0.92 0.76  CALCIUM  9.4 8.4* 8.4*  MG  --  1.9 2.0  PHOS  --  3.7  --    GFR: Estimated Creatinine Clearance: 102.4 mL/min (by C-G formula based on SCr of 0.76 mg/dL). Liver Function Tests: Recent Labs  Lab 03/12/23 0748 03/13/23 0022 03/14/23 0416  AST 23 33 28  ALT 11 17 40  ALKPHOS 84 50 45  BILITOT 1.8* 3.7* 4.5*  PROT 6.8 6.1* 6.5  ALBUMIN 3.6 3.3* 3.2*   Recent Labs  Lab 03/12/23 0748  LIPASE 46   No results for input(s): "AMMONIA" in the last 168 hours. Coagulation Profile: No results for input(s): "INR", "PROTIME" in the last 168 hours. Cardiac Enzymes: Recent Labs  Lab 03/13/23 0015  CKTOTAL 74   BNP (last 3 results) No results for input(s): "PROBNP" in the last 8760 hours. HbA1C: Recent Labs    03/13/23 0022  HGBA1C 6.7*   CBG: Recent Labs  Lab 03/13/23 1627 03/13/23 2027 03/14/23 0016 03/14/23 0400 03/14/23 0735  GLUCAP 123* 103* 116* 101* 114*   Lipid Profile: No results for input(s): "CHOL", "HDL", "LDLCALC", "TRIG", "CHOLHDL", "LDLDIRECT" in the last 72 hours. Thyroid Function Tests: No results for input(s): "TSH", "T4TOTAL", "FREET4", "T3FREE", "THYROIDAB" in the last 72 hours. Anemia Panel: No results for input(s): "VITAMINB12", "FOLATE", "FERRITIN", "TIBC", "IRON", "RETICCTPCT"  in the last 72 hours. Sepsis Labs: Recent Labs  Lab 03/13/23 0015 03/13/23 0423  LATICACIDVEN 1.2 1.1    Recent Results (from the past 240 hour(s))  Culture, blood (Routine X 2) w Reflex to ID Panel     Status: None (Preliminary result)   Collection Time: 03/13/23 12:15 AM   Specimen: BLOOD  Result Value Ref Range Status   Specimen Description   Final    BLOOD BLOOD LEFT HAND Performed at [facility], [address], [place], [place] [address]    Special Requests   Final    BOTTLES DRAWN AEROBIC AND ANAEROBIC Blood Culture adequate volume Performed at [facility], [address], [place], [place] [address]    Culture   Final     NO GROWTH 1 DAY Performed at [facility], [address], [place], [place] [address]    Report Status PENDING  Incomplete  Culture, blood (Routine X 2) w Reflex to ID Panel     Status: None (Preliminary result)   Collection Time: 03/13/23 12:22 AM   Specimen: BLOOD  Result Value Ref Range Status   Specimen Description   Final    BLOOD BLOOD RIGHT HAND Performed at [facility], [address], [place], [place] [address]    Special Requests   Final    BOTTLES DRAWN AEROBIC AND ANAEROBIC Blood Culture adequate volume Performed at [facility], [address] [place], [place] [address]    Culture   Final    NO GROWTH 1 DAY Performed at [facility], [address], [place], [place] [address]    Report Status PENDING  Incomplete         Radiology Studies: MR ABDOMEN MRCP W WO CONTAST  Result Date: 03/13/2023 CLINICAL DATA:  Rule out choledocholithiasis, abdominal pain EXAM: MRI ABDOMEN WITHOUT AND WITH CONTRAST (INCLUDING MRCP) TECHNIQUE: Multiplanar multisequence MR imaging of the abdomen was performed both before and after the administration of intravenous contrast. Heavily T2-weighted images of the biliary and pancreatic ducts were obtained, and three-dimensional MRCP images were rendered by post processing. CONTRAST:  10mL GADAVIST GADOBUTROL 1 MMOL/ML IV SOLN COMPARISON:  CT abdomen pelvis, 03/12/2023 FINDINGS: Lower chest: Trace bilateral pleural effusions. Hepatobiliary: No solid liver abnormality is seen. Probably need to pair of the monitor in the camera numerous small gallstones and sludge within the gallbladder, including near the gallbladder neck, with gallbladder wall thickening. Pancreas: Unremarkable. No pancreatic ductal dilatation or surrounding inflammatory changes. Spleen: Normal in size without significant abnormality. Adrenals/Urinary Tract: Adrenal glands are unremarkable. Kidneys are normal, without renal calculi,  solid lesion, or hydronephrosis. Stomach/Bowel: Stomach is within normal limits. No evidence of bowel wall thickening, distention, or inflammatory changes. Vascular/Lymphatic: No significant vascular findings are present. No enlarged abdominal lymph nodes. Other: No abdominal wall hernia or abnormality. No ascites. Musculoskeletal: No acute or significant osseous findings. IMPRESSION: 1. Numerous small gallstones and sludge within the gallbladder, including near the gallbladder neck, with gallbladder wall thickening. Findings are consistent with acute cholecystitis. 2. No biliary ductal dilatation or choledocholithiasis. 3. Trace bilateral pleural effusions. Electronically Signed   By: Jearld Lesch M.D.   On: 03/13/2023 21:13   MR 3D Recon At Scanner  Result Date: 03/13/2023 CLINICAL DATA:  Rule out choledocholithiasis, abdominal pain EXAM: MRI ABDOMEN WITHOUT AND WITH CONTRAST (INCLUDING MRCP) TECHNIQUE: Multiplanar multisequence MR imaging of the abdomen was performed both before and after the administration of intravenous contrast.  Heavily T2-weighted images of the biliary and pancreatic ducts were obtained, and three-dimensional MRCP images were rendered by post processing. CONTRAST:  10mL GADAVIST GADOBUTROL 1 MMOL/ML IV SOLN COMPARISON:  CT abdomen pelvis, 03/12/2023 FINDINGS: Lower chest: Trace bilateral pleural effusions. Hepatobiliary: No solid liver abnormality is seen. Probably need to pair of the monitor in the camera numerous small gallstones and sludge within the gallbladder, including near the gallbladder neck, with gallbladder wall thickening. Pancreas: Unremarkable. No pancreatic ductal dilatation or surrounding inflammatory changes. Spleen: Normal in size without significant abnormality. Adrenals/Urinary Tract: Adrenal glands are unremarkable. Kidneys are normal, without renal calculi, solid lesion, or hydronephrosis. Stomach/Bowel: Stomach is within normal limits. No evidence of bowel wall  thickening, distention, or inflammatory changes. Vascular/Lymphatic: No significant vascular findings are present. No enlarged abdominal lymph nodes. Other: No abdominal wall hernia or abnormality. No ascites. Musculoskeletal: No acute or significant osseous findings. IMPRESSION: 1. Numerous small gallstones and sludge within the gallbladder, including near the gallbladder neck, with gallbladder wall thickening. Findings are consistent with acute cholecystitis. 2. No biliary ductal dilatation or choledocholithiasis. 3. Trace bilateral pleural effusions. Electronically Signed   By: Jearld Lesch M.D.   On: 03/13/2023 21:13        Scheduled Meds:  Carbidopa-Levodopa ER  1 tablet Oral Daily   insulin aspart  0-9 Units Subcutaneous Q4H   Continuous Infusions:  sodium chloride 10 mL/hr at 03/12/23 1250   sodium chloride 75 mL/hr at 03/13/23 0805   piperacillin-tazobactam (ZOSYN)  IV 12.5 mL/hr at 03/14/23 0504     LOS: 2 days    Time spent: 53 minutes spent on chart review, discussion with nursing staff, consultants, updating family and interview/physical exam; more than 50% of that time was spent in counseling and/or coordination of care.    Alvira Philips [place], DO [facility] Available via Epic secure chat 7am-7pm After these hours, please refer to coverage provider listed on [email or web address] 03/14/2023, 11:34 AM

## 2023-03-14 NOTE — Anesthesia Postprocedure Evaluation (Signed)
Anesthesia Post Note  Patient: Jonathan Yu  Procedure(s) Performed: LAPAROSCOPIC SUBTOTAL CHOLECYSTECTOMY WITH ICG DYE (Abdomen)     Patient location during evaluation: PACU Anesthesia Type: General Level of consciousness: awake Pain management: pain level controlled Vital Signs Assessment: post-procedure vital signs reviewed and stable Respiratory status: spontaneous breathing, nonlabored ventilation and respiratory function stable Cardiovascular status: blood pressure returned to baseline and stable Postop Assessment: no apparent nausea or vomiting Anesthetic complications: yes   Encounter Notable Events  Notable Event Outcome Phase Comment  Difficult to intubate - expected  Intraprocedure Filed from anesthesia note documentation.    Last Vitals:  Vitals:   03/14/23 1700 03/14/23 1715  BP: (!) 157/83 (!) 142/85  Pulse: 87 85  Resp: 17 18  Temp:    SpO2: 96% 95%    Last Pain:  Vitals:   03/14/23 1715  TempSrc:   PainSc: Asleep                 Linton Rump

## 2023-03-15 ENCOUNTER — Encounter (HOSPITAL_COMMUNITY): Payer: Self-pay | Admitting: Surgery

## 2023-03-15 DIAGNOSIS — K810 Acute cholecystitis: Secondary | ICD-10-CM | POA: Diagnosis not present

## 2023-03-15 LAB — GLUCOSE, CAPILLARY
Glucose-Capillary: 134 mg/dL — ABNORMAL HIGH (ref 70–99)
Glucose-Capillary: 144 mg/dL — ABNORMAL HIGH (ref 70–99)
Glucose-Capillary: 153 mg/dL — ABNORMAL HIGH (ref 70–99)
Glucose-Capillary: 160 mg/dL — ABNORMAL HIGH (ref 70–99)
Glucose-Capillary: 192 mg/dL — ABNORMAL HIGH (ref 70–99)
Glucose-Capillary: 196 mg/dL — ABNORMAL HIGH (ref 70–99)

## 2023-03-15 MED ORDER — TAMSULOSIN HCL 0.4 MG PO CAPS
0.8000 mg | ORAL_CAPSULE | Freq: Every day | ORAL | Status: DC
  Administered 2023-03-15 – 2023-03-24 (×10): 0.8 mg via ORAL
  Filled 2023-03-15 (×10): qty 2

## 2023-03-15 MED ORDER — ROPINIROLE HCL 1 MG PO TABS
2.0000 mg | ORAL_TABLET | Freq: Three times a day (TID) | ORAL | Status: DC
  Administered 2023-03-15 – 2023-03-24 (×27): 2 mg via ORAL
  Filled 2023-03-15 (×27): qty 2

## 2023-03-15 MED ORDER — TRAMADOL HCL 50 MG PO TABS
50.0000 mg | ORAL_TABLET | Freq: Four times a day (QID) | ORAL | Status: DC | PRN
  Administered 2023-03-15 – 2023-03-17 (×7): 100 mg via ORAL
  Administered 2023-03-17: 50 mg via ORAL
  Administered 2023-03-18: 100 mg via ORAL
  Administered 2023-03-19: 50 mg via ORAL
  Administered 2023-03-19 – 2023-03-23 (×6): 100 mg via ORAL
  Filled 2023-03-15: qty 2
  Filled 2023-03-15: qty 1
  Filled 2023-03-15 (×9): qty 2
  Filled 2023-03-15: qty 1
  Filled 2023-03-15 (×3): qty 2

## 2023-03-15 MED ORDER — LOSARTAN POTASSIUM 50 MG PO TABS
50.0000 mg | ORAL_TABLET | Freq: Every day | ORAL | Status: DC
  Administered 2023-03-15 – 2023-03-24 (×10): 50 mg via ORAL
  Filled 2023-03-15 (×10): qty 1

## 2023-03-15 MED ORDER — BUSPIRONE HCL 5 MG PO TABS
5.0000 mg | ORAL_TABLET | Freq: Three times a day (TID) | ORAL | Status: DC
  Administered 2023-03-15 – 2023-03-24 (×27): 5 mg via ORAL
  Filled 2023-03-15 (×27): qty 1

## 2023-03-15 MED ORDER — INSULIN ASPART 100 UNIT/ML IJ SOLN
0.0000 [IU] | Freq: Three times a day (TID) | INTRAMUSCULAR | Status: DC
  Administered 2023-03-15 – 2023-03-16 (×2): 2 [IU] via SUBCUTANEOUS
  Administered 2023-03-16: 1 [IU] via SUBCUTANEOUS
  Administered 2023-03-16: 2 [IU] via SUBCUTANEOUS
  Administered 2023-03-16 – 2023-03-18 (×3): 1 [IU] via SUBCUTANEOUS
  Administered 2023-03-18: 5 [IU] via SUBCUTANEOUS
  Administered 2023-03-18 – 2023-03-19 (×2): 2 [IU] via SUBCUTANEOUS
  Administered 2023-03-19: 1 [IU] via SUBCUTANEOUS
  Administered 2023-03-19: 2 [IU] via SUBCUTANEOUS
  Administered 2023-03-20: 1 [IU] via SUBCUTANEOUS
  Administered 2023-03-20 (×2): 2 [IU] via SUBCUTANEOUS
  Administered 2023-03-21: 3 [IU] via SUBCUTANEOUS
  Administered 2023-03-21 (×2): 2 [IU] via SUBCUTANEOUS
  Administered 2023-03-22 (×2): 1 [IU] via SUBCUTANEOUS
  Administered 2023-03-22: 2 [IU] via SUBCUTANEOUS
  Administered 2023-03-23 (×3): 1 [IU] via SUBCUTANEOUS

## 2023-03-15 NOTE — Progress Notes (Signed)
1 Day Post-Op lap subtotal cholecystectomy Subjective: No acute issues overnight  Objective: Vital signs in last 24 hours: Temp:  [97.8 F (36.6 C)-98.9 F (37.2 C)] 98 F (36.7 C) (08/03 0456) Pulse Rate:  [71-93] 71 (08/03 0456) Resp:  [14-20] 18 (08/03 0456) BP: (142-178)/(77-95) 147/77 (08/03 0456) SpO2:  [91 %-100 %] 94 % (08/03 0456) FiO2 (%):  [21 %] 21 % (08/02 2335) Weight:  [118.6 kg] 118.6 kg (08/02 1328)   Intake/Output from previous day: 08/02 0701 - 08/03 0700 In: 1208.5 [P.O.:120; I.V.:1000; IV Piggyback:88.5] Out: 2150 [Urine:1875; Drains:225; Blood:50] Intake/Output this shift: No intake/output data recorded.   General appearance: alert and cooperative GI: soft, non-distended JP: bilious drainage Incision: no significant drainage  Lab Results:  Recent Labs    03/14/23 0416 03/15/23 0529  WBC 7.5 5.8  HGB 14.0 13.5  HCT 42.4 41.2  PLT 165 197   BMET Recent Labs    03/14/23 0416 03/15/23 0529  NA 136 136  K 3.7 3.8  CL 101 102  CO2 27 26  GLUCOSE 100* 170*  BUN 12 14  CREATININE 0.76 0.85  CALCIUM 8.4* 8.3*   PT/INR No results for input(s): "LABPROT", "INR" in the last 72 hours. ABG No results for input(s): "PHART", "HCO3" in the last 72 hours.  Invalid input(s): "PCO2", "PO2"  MEDS, Scheduled  acetaminophen  1,000 mg Oral TID   Carbidopa-Levodopa ER  1 tablet Oral Daily   insulin aspart  0-9 Units Subcutaneous Q4H   lidocaine  1 patch Transdermal Q24H    Studies/Results: MR ABDOMEN MRCP W WO CONTAST  Result Date: 03/13/2023 CLINICAL DATA:  Rule out choledocholithiasis, abdominal pain EXAM: MRI ABDOMEN WITHOUT AND WITH CONTRAST (INCLUDING MRCP) TECHNIQUE: Multiplanar multisequence MR imaging of the abdomen was performed both before and after the administration of intravenous contrast. Heavily T2-weighted images of the biliary and pancreatic ducts were obtained, and three-dimensional MRCP images were rendered by post processing.  CONTRAST:  10mL GADAVIST GADOBUTROL 1 MMOL/ML IV SOLN COMPARISON:  CT abdomen pelvis, 03/12/2023 FINDINGS: Lower chest: Trace bilateral pleural effusions. Hepatobiliary: No solid liver abnormality is seen. Probably need to pair of the monitor in the camera numerous small gallstones and sludge within the gallbladder, including near the gallbladder neck, with gallbladder wall thickening. Pancreas: Unremarkable. No pancreatic ductal dilatation or surrounding inflammatory changes. Spleen: Normal in size without significant abnormality. Adrenals/Urinary Tract: Adrenal glands are unremarkable. Kidneys are normal, without renal calculi, solid lesion, or hydronephrosis. Stomach/Bowel: Stomach is within normal limits. No evidence of bowel wall thickening, distention, or inflammatory changes. Vascular/Lymphatic: No significant vascular findings are present. No enlarged abdominal lymph nodes. Other: No abdominal wall hernia or abnormality. No ascites. Musculoskeletal: No acute or significant osseous findings. IMPRESSION: 1. Numerous small gallstones and sludge within the gallbladder, including near the gallbladder neck, with gallbladder wall thickening. Findings are consistent with acute cholecystitis. 2. No biliary ductal dilatation or choledocholithiasis. 3. Trace bilateral pleural effusions. Electronically Signed   By: Jearld Lesch M.D.   On: 03/13/2023 21:13   MR 3D Recon At Scanner  Result Date: 03/13/2023 CLINICAL DATA:  Rule out choledocholithiasis, abdominal pain EXAM: MRI ABDOMEN WITHOUT AND WITH CONTRAST (INCLUDING MRCP) TECHNIQUE: Multiplanar multisequence MR imaging of the abdomen was performed both before and after the administration of intravenous contrast. Heavily T2-weighted images of the biliary and pancreatic ducts were obtained, and three-dimensional MRCP images were rendered by post processing. CONTRAST:  10mL GADAVIST GADOBUTROL 1 MMOL/ML IV SOLN COMPARISON:  CT abdomen pelvis,  03/12/2023 FINDINGS:  Lower chest: Trace bilateral pleural effusions. Hepatobiliary: No solid liver abnormality is seen. Probably need to pair of the monitor in the camera numerous small gallstones and sludge within the gallbladder, including near the gallbladder neck, with gallbladder wall thickening. Pancreas: Unremarkable. No pancreatic ductal dilatation or surrounding inflammatory changes. Spleen: Normal in size without significant abnormality. Adrenals/Urinary Tract: Adrenal glands are unremarkable. Kidneys are normal, without renal calculi, solid lesion, or hydronephrosis. Stomach/Bowel: Stomach is within normal limits. No evidence of bowel wall thickening, distention, or inflammatory changes. Vascular/Lymphatic: No significant vascular findings are present. No enlarged abdominal lymph nodes. Other: No abdominal wall hernia or abnormality. No ascites. Musculoskeletal: No acute or significant osseous findings. IMPRESSION: 1. Numerous small gallstones and sludge within the gallbladder, including near the gallbladder neck, with gallbladder wall thickening. Findings are consistent with acute cholecystitis. 2. No biliary ductal dilatation or choledocholithiasis. 3. Trace bilateral pleural effusions. Electronically Signed   By: Jearld Lesch M.D.   On: 03/13/2023 21:13    Assessment: s/p Procedure(s): LAPAROSCOPIC SUBTOTAL CHOLECYSTECTOMY WITH ICG DYE Patient Active Problem List   Diagnosis Date Noted   Acute cholecystitis 03/12/2023   Primary osteoarthritis, left elbow 02/10/2023   Cyst of skin 12/26/2022   Elevated bilirubin 07/25/2022   Atopic dermatitis 07/15/2022   Acute cough 06/23/2022   Plantar fasciitis of left foot 01/16/2022   Left leg swelling 01/29/2021   Parkinsonism 01/29/2021   Joint pain 09/14/2020   Pharyngitis 08/01/2020   Insomnia 08/01/2020   Seborrheic keratoses 03/16/2020   Venous stasis dermatitis of both lower extremities 09/03/2019   Eustachian tube dysfunction, bilateral 08/16/2019    Anxiety state 08/16/2019   History of 2019 novel coronavirus disease (COVID-19) 08/16/2019   COVID-19 virus infection 08/07/2019   Hypogonadism in male 01/14/2018   Complete tear of left rotator cuff 10/02/2017   Chronic left shoulder pain 10/02/2017   Acute pain of left shoulder 09/22/2017   GERD (gastroesophageal reflux disease) 07/02/2017   Rosacea 05/29/2017   Unilateral primary osteoarthritis, left knee 09/12/2016   Idiopathic chronic venous hypertension of both lower extremities with inflammation 09/12/2016   Muscle spasm 03/07/2016   Chronic bilateral ankle plain, right total ankle arthroplasty, left ankle posttraumatic osteoarthritis 08/18/2013   ED (erectile dysfunction) 09/10/2011   BPH (benign prostatic hyperplasia) 09/10/2011   Kidney stones 01/28/2011   Type 2 diabetes mellitus without complication, without long-term current use of insulin (HCC) 01/24/2011   OSA on CPAP 01/24/2011   Right lumbar radiculopathy 01/24/2011   Essential hypertension, benign 12/20/2010   Dyslipidemia 12/20/2010    Expected post op course  Plan: Cont diet Cont IV abx for 4 more days Cont JP, monitor output   LOS: 3 days     .Vanita Panda, MD [facility], [place]    03/15/2023 8:19 AM

## 2023-03-15 NOTE — Plan of Care (Signed)
  Problem: Education: Goal: Knowledge of General Education information will improve Description: Including pain rating scale, medication(s)/side effects and non-pharmacologic comfort measures Outcome: Progressing   Problem: Health Behavior/Discharge Planning: Goal: Ability to manage health-related needs will improve Outcome: Progressing   Problem: Clinical Measurements: Goal: Ability to maintain clinical measurements within normal limits will improve Outcome: Progressing   Problem: Activity: Goal: Risk for activity intolerance will decrease Outcome: Not Progressing

## 2023-03-15 NOTE — Evaluation (Signed)
Occupational Therapy Evaluation and Discharge Patient Details Name: Jonathan Yu MRN: [id number] DOB: [birth year] Today's Date: 03/15/2023   History of Present Illness Jonathan Yu is a 72 y.o. male presented with abd pain;CTabd/pelvis - Findings highly concerning for acute cholecystitis.7/2 pt s/p laparoscopic subtotal cholecystectomy. PHMx: HLD, HTN OSA, DM2 , parkinson disease, hypogonadism.   Clinical Impression   This 72 yo male admitted and underwent above presents to acute OT with PLOF of usually independent to Mod I without an AD but has gotten weaker over the last several weeks and his kids have had to help him with ADLs and IADLs. He currently is setup/S-max A for basic ADLs with AE or AD for ambulation and Min A-Mod A for mobility. He will continue to benefit from acute OT without need for follow up.     Recommendations for follow up therapy are one component of a multi-disciplinary discharge planning process, led by the attending physician.  Recommendations may be updated based on patient status, additional functional criteria and insurance authorization.   Assistance Recommended at Discharge PRN  Patient can return home with the following A little help with walking and/or transfers;A lot of help with bathing/dressing/bathroom;Assistance with cooking/housework;Assist for transportation;Help with stairs or ramp for entrance    Functional Status Assessment  Patient has had a recent decline in their functional status and demonstrates the ability to make significant improvements in function in a reasonable and predictable amount of time.  Equipment Recommendations  None recommended by OT       Precautions / Restrictions Precautions Precautions: Fall Precaution Comments: Right JP drain Restrictions Weight Bearing Restrictions: No      Mobility Bed Mobility Overal bed mobility: Needs Assistance Bed Mobility: Supine to Sit     Supine to sit: HOB elevated, Mod assist      General bed mobility comments: partial A for legs and trunk (HOB all the way up and pt using rails)    Transfers Overall transfer level: Needs assistance Equipment used: None Transfers: Sit to/from Stand Sit to Stand: Min assist                  Balance Overall balance assessment: Needs assistance Sitting-balance support: Bilateral upper extremity supported, Feet supported Sitting balance-Leahy Scale: Poor Sitting balance - Comments: intermittent posterior lean that he asks to help correct   Standing balance support: No upper extremity supported Standing balance-Leahy Scale: Fair                             ADL either performed or assessed with clinical judgement   ADL Overall ADL's : Needs assistance/impaired Eating/Feeding: Independent Eating/Feeding Details (indicate cue type and reason): supported sitting Grooming: Sitting Grooming Details (indicate cue type and reason): supported sitting Upper Body Bathing: Minimal assistance Upper Body Bathing Details (indicate cue type and reason): Decreased AROM shoulder due to bad shoulder; resting tremors Lower Body Bathing: Moderate assistance Lower Body Bathing Details (indicate cue type and reason): min A sit<>stand (reports family sometimes has to A him) Upper Body Dressing : Minimal assistance Upper Body Dressing Details (indicate cue type and reason): supported sitting; reports kids sometimes have to A him Lower Body Dressing: Maximal assistance Lower Body Dressing Details (indicate cue type and reason): min A sit<>stand (reports kids sometimes have to A him) Toilet Transfer: Minimal assistance;Ambulation Toilet Transfer Details (indicate cue type and reason): simulated bed>down hallway>back to room to sit in recliner Toileting- Clothing Manipulation and Hygiene:  Moderate assistance Toileting - Clothing Manipulation Details (indicate cue type and reason): Reports he has a bidet at home              Vision Patient Visual Report: No change from baseline              Pertinent Vitals/Pain Pain Assessment Pain Assessment: 0-10 Pain Score: 7  Pain Location: right flank where surgery was Pain Descriptors / Indicators: Sore Pain Intervention(s): Limited activity within patient's tolerance, Monitored during session, Repositioned     Hand Dominance Right   Extremity/Trunk Assessment Upper Extremity Assessment Upper Extremity Assessment: RUE deficits/detail;LUE deficits/detail RUE Deficits / Details: Decreased AROM shoulder due to bad shoulder; resting tremors RUE Coordination: decreased gross motor LUE Deficits / Details: Decreased AROM shoulder due to bad shoulder; resting tremors LUE Coordination: decreased gross motor           Communication Communication Communication: No difficulties   Cognition Arousal/Alertness: Awake/alert Behavior During Therapy: WFL for tasks assessed/performed Overall Cognitive Status: Within Functional Limits for tasks assessed                                                  Home Living Family/patient expects to be discharged to:: Private residence Living Arrangements: Children Available Help at Discharge: Family;Available PRN/intermittently Type of Home: House Home Access: Stairs to enter [employer] of Steps: 2 Entrance Stairs-Rails: None       Bathroom Shower/Tub: Walk-in shower (walk in tub also)   Firefighter: Standard     Home Equipment: Scientist, physiological Equipment: Ship broker Additional Comments: 3 kids who live in house with him and can A him when they are home (2 work and 1 in school)      Prior Functioning/Environment Prior Level of Function : Needs assist       Physical Assist : ADLs (physical)   ADLs (physical): Dressing   ADLs Comments: Was able to do most of his dressing until recently when he started feeling bad (now he knows why)        OT  Problem List: Decreased strength;Decreased range of motion;Impaired balance (sitting and/or standing);Pain;Obesity      OT Treatment/Interventions: Self-care/ADL training;DME and/or AE instruction;Balance training;Patient/family education    OT Goals(Current goals can be found in the care plan section) Acute Rehab OT Goals Patient Stated Goal: to start feeling better now OT Goal Formulation: With patient Time For Goal Achievement: 03/29/23 Potential to Achieve Goals: Good  OT Frequency: Min 1X/week       AM-PAC OT "6 Clicks" Daily Activity     Outcome Measure Help from another person eating meals?: None Help from another person taking care of personal grooming?: A Little Help from another person toileting, which includes using toliet, bedpan, or urinal?: A Lot Help from another person bathing (including washing, rinsing, drying)?: A Lot Help from another person to put on and taking off regular upper body clothing?: A Lot Help from another person to put on and taking off regular lower body clothing?: A Lot 6 Click Score: 15   End of Session Equipment Utilized During Treatment: Gait belt Nurse Communication: Mobility status (RN saw him walking in hallway. JP drain leaking, I shut it and re-charged it.)  Activity Tolerance: Patient tolerated treatment well Patient left: in chair;with call bell/phone within reach  OT Visit Diagnosis: Unsteadiness on  feet (R26.81);Other abnormalities of gait and mobility (R26.89);Pain Pain - Right/Left: Right Pain - part of body:  (flank--surgical site area)                Time: 5284-1324 OT Time Calculation (min): 32 min Charges:  OT General Charges $OT Visit: 1 Visit OT Evaluation $OT Eval Moderate Complexity: 1 Mod OT Treatments $Self Care/Home Management : 8-22 mins Lindon Romp OT Acute Rehabilitation Services Office [phone]    Evette Georges 03/15/2023, 12:49 PM

## 2023-03-15 NOTE — Progress Notes (Signed)
PROGRESS NOTE    Jonathan Yu  VWU:[id number] DOB: July 04, 1951 DOA: 03/12/2023 PCP: Everrett Coombe, DO    Brief Narrative:   Jonathan Yu is a 72 y.o. male with past medical history significant for HTN, HLD, type 2 diabetes mellitus, OSA, Parkinson's disease, hypogonadism who presented to [facility] ED with complaints of abdominal pain.  Patient reports he was eating fried chicken livers at a restaurant night prior and felt some stomach upset in which he took Pepcid.  No relief with medication and start developing more severe constant pain to his epigastric region associated with nausea.  Initially thought was due to food poisoning but given his progressive symptoms he sought further care in the ED.  In the ED, temperature 101.8 F, HR 63, RR 20, BP 168/85, SpO2 96% on room air.  WBC 7.6, hemoglobin 14.8, platelets 167.  Sodium 141, potassium 4.1, chloride 106, CO2 26, glucose 217, BUN 12, creatinine 0.98.  AST 23, ALT 11, total bili 1.8.  Lipase 46.  High sensitive troponin 5.  Lactic acid 1.2.  Urinalysis unrevealing.  CT abdomen/pelvis with contrast with findings highly concerning for acute cholecystitis.  Ultrasound abdomen with cholelithiasis with positive sonographic Murphy sign, suspected gallbladder wall thickening, no biliary dilation, hepatic steatosis.  General surgery was consulted.  Patient was started on empiric antibiotics.  TRH consulted for admission and patient was transferred to [facility] for further evaluation management for concern of acute cholecystitis.  Assessment & Plan:   Acute cholecystitis Patient presenting to the ED with acute onset epigastric pain associated with fever and nausea.  WBC count 7.6. CT abdomen/pelvis with contrast with findings highly concerning for acute cholecystitis.  Ultrasound abdomen with cholelithiasis with positive sonographic Murphy sign, suspected gallbladder wall thickening, no biliary dilation.  MRCP  With numerous calls   Wound/blood with  And gallbladder are,  Gallbladder wall thickening without biliary ductal dilation or choledocholithiasis; consistent with acute cholecystitis.  General surgery was consulted and patient underwent subtotal laparoscopic cholecystectomy on 03/14/2023 by Dr. Cliffton Asters with drain placement. -- General surgery following, appreciate assistance -- WBC 7.6>9.5>7.5>5.8 -- Zosyn 3.375 g IV every 8 hours -- Lidoderm patch, Tylenol, Advil PRN mild/moderate pain -- Tramadol 50-100 mg p.o. every 6 hours as needed severe pain -- continue to monitor drain output, 225 mL past 24h -- CBC daily  Elevated total bilirubin Etiology likely secondary to acute cholecystitis; no biliary ductal dilation or choledocholithiasis noted on MRCP. -- Tbili 1.8>>4.5>2.1 -- CMP daily  Essential hypertension Home regimen includes losartan 50 mg p.o. daily, furosemide 20 mg p.o. daily PRN. -- Restart losartan 50 mg p.o. daily today -- Continue to hold home furosemide  Hyperlipidemia -- Holding home atorvastatin 40 mg p.o. daily  Type 2 diabetes mellitus Home regimen includes metformin 500 mg p.o. twice daily, glipizide 5 mg p.o. twice daily.  Hemoglobin A1c 6.7, well-controlled. -- Hold oral diabetic medications -- Sensitive SSI for coverage -- CBGs every 4 hours while NPO  Parkinson's disease -- Continue Sinemet, BuSpar, Requip  BPH -- Tamsulosin 0.8 mg p.o. daily  OSA -- Nocturnal CPAP  Morbid obesity Body mass index is 43.52 kg/m.  Discussed with patient needs for aggressive lifestyle changes/weight loss as this complicates all facets of care.  Outpatient follow-up with PCP.     DVT prophylaxis: SCDs Start: 03/12/23 2042    Code Status: Full Code Family Communication: No family present at bedside this morning  Disposition Plan:  Level of care: Med-Surg Status is: Inpatient Remains inpatient  appropriate because: Pending laparoscopic cholecystectomy today, further per general surgery     Consultants:  General surgery Discussed with [facility], Dr. Marca Ancona on 8/1  Procedures:  Laparoscopic subtotal cholecystectomy with drain placement, Dr. Cliffton Asters 8/2  Antimicrobials:  Zosyn 7/31>>   Subjective: Patient seen and examined at bedside, resting calmly.  RN present at bedside.  No specific complaints this morning, abdominal pain much improved.  Discussed with general surgery, pending laparoscopic cholecystectomy this afternoon.  Denies headache, no dizziness, no chest pain, no palpitations, no shortness of breath, no cough/congestion, no fever/chills/night sweats, no current nausea/vomiting, no diarrhea, no focal weakness, no fatigue, no paresthesias.  No acute events overnight per nursing staff.   Objective: Vitals:   03/14/23 2022 03/14/23 2104 03/15/23 0456 03/15/23 0954  BP: (!) 151/85 (!) 161/90 (!) 147/77 (!) 155/83  Pulse: 90 90 71 64  Resp: 18 18 18 16   Temp: 98.2 F (36.8 C) 98.7 F (37.1 C) 98 F (36.7 C) (!) 97.5 F (36.4 C)  TempSrc: Oral  Oral Oral  SpO2: 91% 93% 94% 92%  Weight:      Height:        Intake/Output Summary (Last 24 hours) at 03/15/2023 1336 Last data filed at 03/15/2023 1000 Gross per 24 hour  Intake 1448.52 ml  Output 1680 ml  Net -231.48 ml   Filed Weights   03/12/23 0738 03/14/23 1328  Weight: 118.6 kg 118.6 kg    Examination:  Physical Exam: GEN: NAD, alert and oriented x 3, obese HEENT: NCAT, PERRL, EOMI, sclera clear, MMM PULM: CTAB w/o wheezes/crackles, normal respiratory effort, on room air CV: RRR w/o M/G/R GI: abd soft, nondistended, NTTP, + BS, no R/G/M; JP drain noted MSK: + LLE edema that is asymmetric and reported as chronic per patient , muscle strength globally intact 5/5 bilateral upper/lower extremities NEURO: CN II-XII intact, no focal deficits, sensation to light touch intact PSYCH: normal mood/affect Integumentary: dry/intact, no rashes or wounds    Data Reviewed: I have personally reviewed following labs  and imaging studies  CBC: Recent Labs  Lab 03/12/23 0748 03/13/23 0022 03/14/23 0416 03/15/23 0529  WBC 7.6 9.5 7.5 5.8  HGB 14.8 14.0 14.0 13.5  HCT 43.9 42.4 42.4 41.2  MCV 87.8 90.0 91.0 89.2  PLT 167 164 165 197   Basic Metabolic Panel: Recent Labs  Lab 03/12/23 0748 03/13/23 0022 03/14/23 0416 03/15/23 0529  NA 141 135 136 136  K 4.1 3.5 3.7 3.8  CL 106 101 101 102  CO2 26 25 27 26   GLUCOSE 217* 122* 100* 170*  BUN 12 12 12 14   CREATININE 0.98 0.92 0.76 0.85  CALCIUM 9.4 8.4* 8.4* 8.3*  MG  --  1.9 2.0  --   PHOS  --  3.7  --   --    GFR: Estimated Creatinine Clearance: 96.4 mL/min (by C-G formula based on SCr of 0.85 mg/dL). Liver Function Tests: Recent Labs  Lab 03/12/23 0748 03/13/23 0022 03/14/23 0416 03/15/23 0529  AST 23 33 28 43*  ALT 11 17 40 54*  ALKPHOS 84 50 45 43  BILITOT 1.8* 3.7* 4.5* 2.1*  PROT 6.8 6.1* 6.5 6.4*  ALBUMIN 3.6 3.3* 3.2* 2.9*   Recent Labs  Lab 03/12/23 0748  LIPASE 46   No results for input(s): "AMMONIA" in the last 168 hours. Coagulation Profile: No results for input(s): "INR", "PROTIME" in the last 168 hours. Cardiac Enzymes: Recent Labs  Lab 03/13/23 0015  CKTOTAL 74  BNP (last 3 results) No results for input(s): "PROBNP" in the last 8760 hours. HbA1C: Recent Labs    03/13/23 0022  HGBA1C 6.7*   CBG: Recent Labs  Lab 03/14/23 2019 03/15/23 0009 03/15/23 0401 03/15/23 0754 03/15/23 1153  GLUCAP 257* 192* 134* 153* 144*   Lipid Profile: No results for input(s): "CHOL", "HDL", "LDLCALC", "TRIG", "CHOLHDL", "LDLDIRECT" in the last 72 hours. Thyroid Function Tests: No results for input(s): "TSH", "T4TOTAL", "FREET4", "T3FREE", "THYROIDAB" in the last 72 hours. Anemia Panel: No results for input(s): "VITAMINB12", "FOLATE", "FERRITIN", "TIBC", "IRON", "RETICCTPCT" in the last 72 hours. Sepsis Labs: Recent Labs  Lab 03/13/23 0015 03/13/23 0423  LATICACIDVEN 1.2 1.1    Recent Results (from the  past 240 hour(s))  Culture, blood (Routine X 2) w Reflex to ID Panel     Status: None (Preliminary result)   Collection Time: 03/13/23 12:15 AM   Specimen: BLOOD  Result Value Ref Range Status   Specimen Description   Final    BLOOD BLOOD LEFT HAND Performed at [facility], [address] [place], [place] [address]    Special Requests   Final    BOTTLES DRAWN AEROBIC AND ANAEROBIC Blood Culture adequate volume Performed at [facility], [address] [place], [place] [address]    Culture   Final    NO GROWTH 2 DAYS Performed at [facility], [address] [place], [place] [address]    Report Status PENDING  Incomplete  Culture, blood (Routine X 2) w Reflex to ID Panel     Status: None (Preliminary result)   Collection Time: 03/13/23 12:22 AM   Specimen: BLOOD  Result Value Ref Range Status   Specimen Description   Final    BLOOD BLOOD RIGHT HAND Performed at [facility], [address], [place], [place] [address]    Special Requests   Final    BOTTLES DRAWN AEROBIC AND ANAEROBIC Blood Culture adequate volume Performed at [facility], [address], [place], [place] [address]    Culture   Final    NO GROWTH 2 DAYS Performed at [facility], [address] [place], [place] [address]    Report Status PENDING  Incomplete  MRSA Next Gen by PCR, Nasal     Status: None   Collection Time: 03/14/23 10:46 AM   Specimen: Nasal Mucosa; Nasal Swab  Result Value Ref Range Status   MRSA by PCR Next Gen NOT DETECTED NOT DETECTED Final    Comment: (NOTE) The GeneXpert MRSA Assay (FDA approved for NASAL specimens only), is one component of a comprehensive MRSA colonization surveillance program. It is not intended to diagnose MRSA infection nor to guide or monitor treatment for MRSA infections. Test performance is not FDA approved in patients less than 4 years old. Performed at [facility], [address], [place], [place] [address]          Radiology Studies: MR ABDOMEN MRCP W WO CONTAST  Result Date: 03/13/2023 CLINICAL DATA:  Rule out choledocholithiasis, abdominal pain EXAM: MRI ABDOMEN WITHOUT AND WITH CONTRAST (INCLUDING MRCP) TECHNIQUE: Multiplanar multisequence MR imaging of the abdomen was performed both before and after the administration of intravenous contrast. Heavily T2-weighted images of the biliary and pancreatic ducts were obtained, and three-dimensional MRCP images were rendered by post processing. CONTRAST:  10mL GADAVIST GADOBUTROL 1 MMOL/ML IV SOLN COMPARISON:  CT abdomen pelvis, 03/12/2023 FINDINGS: Lower chest: Trace bilateral  pleural effusions. Hepatobiliary: No solid liver abnormality is seen. Probably need to pair of the monitor in the camera numerous small gallstones and sludge within the gallbladder, including near the gallbladder neck, with gallbladder wall thickening. Pancreas: Unremarkable. No pancreatic ductal dilatation or surrounding inflammatory changes. Spleen: Normal in size without significant abnormality. Adrenals/Urinary Tract: Adrenal glands are unremarkable. Kidneys are normal, without renal calculi, solid lesion, or hydronephrosis. Stomach/Bowel: Stomach is within normal limits. No evidence of bowel wall thickening, distention, or inflammatory changes. Vascular/Lymphatic: No significant vascular findings are present. No enlarged abdominal lymph nodes. Other: No abdominal wall hernia or abnormality. No ascites. Musculoskeletal: No acute or significant osseous findings. IMPRESSION: 1. Numerous small gallstones and sludge within the gallbladder, including near the gallbladder neck, with gallbladder wall thickening. Findings are consistent with acute cholecystitis. 2. No biliary ductal dilatation or choledocholithiasis. 3. Trace bilateral pleural effusions. Electronically Signed   By: Jearld Lesch M.D.   On: 03/13/2023 21:13   MR  3D Recon At Scanner  Result Date: 03/13/2023 CLINICAL DATA:  Rule out choledocholithiasis, abdominal pain EXAM: MRI ABDOMEN WITHOUT AND WITH CONTRAST (INCLUDING MRCP) TECHNIQUE: Multiplanar multisequence MR imaging of the abdomen was performed both before and after the administration of intravenous contrast. Heavily T2-weighted images of the biliary and pancreatic ducts were obtained, and three-dimensional MRCP images were rendered by post processing. CONTRAST:  10mL GADAVIST GADOBUTROL 1 MMOL/ML IV SOLN COMPARISON:  CT abdomen pelvis, 03/12/2023 FINDINGS: Lower chest: Trace bilateral pleural effusions. Hepatobiliary: No solid liver abnormality is seen. Probably need to pair of the monitor in the camera numerous small gallstones and sludge within the gallbladder, including near the gallbladder neck, with gallbladder wall thickening. Pancreas: Unremarkable. No pancreatic ductal dilatation or surrounding inflammatory changes. Spleen: Normal in size without significant abnormality. Adrenals/Urinary Tract: Adrenal glands are unremarkable. Kidneys are normal, without renal calculi, solid lesion, or hydronephrosis. Stomach/Bowel: Stomach is within normal limits. No evidence of bowel wall thickening, distention, or inflammatory changes. Vascular/Lymphatic: No significant vascular findings are present. No enlarged abdominal lymph nodes. Other: No abdominal wall hernia or abnormality. No ascites. Musculoskeletal: No acute or significant osseous findings. IMPRESSION: 1. Numerous small gallstones and sludge within the gallbladder, including near the gallbladder neck, with gallbladder wall thickening. Findings are consistent with acute cholecystitis. 2. No biliary ductal dilatation or choledocholithiasis. 3. Trace bilateral pleural effusions. Electronically Signed   By: Jearld Lesch M.D.   On: 03/13/2023 21:13        Scheduled Meds:  acetaminophen  1,000 mg Oral TID   Carbidopa-Levodopa ER  1 tablet Oral Daily    insulin aspart  0-9 Units Subcutaneous Q4H   lidocaine  1 patch Transdermal Q24H   Continuous Infusions:  sodium chloride 10 mL/hr at 03/12/23 1250   sodium chloride 75 mL/hr at 03/15/23 0841   piperacillin-tazobactam (ZOSYN)  IV 3.375 g (03/15/23 1226)     LOS: 3 days    Time spent: 53 minutes spent on chart review, discussion with nursing staff, consultants, updating family and interview/physical exam; more than 50% of that time was spent in counseling and/or coordination of care.    Alvira Philips [place], DO [facility] Available via Epic secure chat 7am-7pm After these hours, please refer to coverage provider listed on [email or web address] 03/15/2023, 1:36 PM

## 2023-03-16 DIAGNOSIS — K810 Acute cholecystitis: Secondary | ICD-10-CM | POA: Diagnosis not present

## 2023-03-16 LAB — CBC
HCT: 39.5 % (ref 39.0–52.0)
Hemoglobin: 12.8 g/dL — ABNORMAL LOW (ref 13.0–17.0)
MCH: 29.3 pg (ref 26.0–34.0)
MCHC: 32.4 g/dL (ref 30.0–36.0)
MCV: 90.4 fL (ref 80.0–100.0)
Platelets: 244 10*3/uL (ref 150–400)
RBC: 4.37 MIL/uL (ref 4.22–5.81)
RDW: 13.3 % (ref 11.5–15.5)
WBC: 8.5 10*3/uL (ref 4.0–10.5)
nRBC: 0 % (ref 0.0–0.2)

## 2023-03-16 LAB — COMPREHENSIVE METABOLIC PANEL WITH GFR
ALT: 48 U/L — ABNORMAL HIGH (ref 0–44)
AST: 36 U/L (ref 15–41)
Albumin: 3 g/dL — ABNORMAL LOW (ref 3.5–5.0)
Alkaline Phosphatase: 42 U/L (ref 38–126)
Anion gap: 9 (ref 5–15)
BUN: 19 mg/dL (ref 8–23)
CO2: 26 mmol/L (ref 22–32)
Calcium: 8.9 mg/dL (ref 8.9–10.3)
Chloride: 104 mmol/L (ref 98–111)
Creatinine, Ser: 0.83 mg/dL (ref 0.61–1.24)
GFR, Estimated: >60 mL/min (ref >60)
Glucose, Bld: 139 mg/dL — ABNORMAL HIGH (ref 70–99)
Potassium: 3.3 mmol/L — ABNORMAL LOW (ref 3.5–5.1)
Sodium: 139 mmol/L (ref 135–145)
Total Bilirubin: 1.3 mg/dL — ABNORMAL HIGH (ref 0.3–1.2)
Total Protein: 6.3 g/dL — ABNORMAL LOW (ref 6.5–8.1)

## 2023-03-16 LAB — GLUCOSE, CAPILLARY
Glucose-Capillary: 127 mg/dL — ABNORMAL HIGH (ref 70–99)
Glucose-Capillary: 154 mg/dL — ABNORMAL HIGH (ref 70–99)
Glucose-Capillary: 129 mg/dL — ABNORMAL HIGH (ref 70–99)
Glucose-Capillary: 184 mg/dL — ABNORMAL HIGH (ref 70–99)

## 2023-03-16 MED ORDER — HYDROXYZINE HCL 10 MG PO TABS
10.0000 mg | ORAL_TABLET | Freq: Once | ORAL | Status: AC | PRN
  Administered 2023-03-16: 10 mg via ORAL
  Filled 2023-03-16: qty 1

## 2023-03-16 MED ORDER — POTASSIUM CHLORIDE CRYS ER 20 MEQ PO TBCR
30.0000 meq | EXTENDED_RELEASE_TABLET | ORAL | Status: AC
  Administered 2023-03-16 (×2): 30 meq via ORAL
  Filled 2023-03-16 (×2): qty 1

## 2023-03-16 NOTE — Progress Notes (Signed)
2 Days Post-Op lap subtotal cholecystectomy Subjective: No acute issues overnight, had some sharp upper abd pain last night  Objective: Vital signs in last 24 hours: Temp:  [97.5 F (36.4 C)-97.8 F (36.6 C)] 97.5 F (36.4 C) (08/04 0513) Pulse Rate:  [64-73] 69 (08/04 0513) Resp:  [16-19] 18 (08/04 0513) BP: (109-155)/(69-85) 135/85 (08/04 0513) SpO2:  [90 %-95 %] 90 % (08/04 0513) FiO2 (%):  [21 %] 21 % (08/04 0000)   Intake/Output from previous day: 08/03 0701 - 08/04 0700 In: 720 [P.O.:720] Out: 790 [Urine:600; Drains:190] Intake/Output this shift: No intake/output data recorded.   General appearance: alert and cooperative GI: soft, non-distended JP: bilious drainage Incision: no significant drainage  Lab Results:  Recent Labs    03/15/23 0529 03/16/23 0541  WBC 5.8 8.5  HGB 13.5 12.8*  HCT 41.2 39.5  PLT 197 244   BMET Recent Labs    03/15/23 0529 03/16/23 0541  NA 136 139  K 3.8 3.3*  CL 102 104  CO2 26 26  GLUCOSE 170* 139*  BUN 14 19  CREATININE 0.85 0.83  CALCIUM 8.3* 8.9   PT/INR No results for input(s): "LABPROT", "INR" in the last 72 hours. ABG No results for input(s): "PHART", "HCO3" in the last 72 hours.  Invalid input(s): "PCO2", "PO2"  MEDS, Scheduled  acetaminophen  1,000 mg Oral TID   busPIRone  5 mg Oral TID   Carbidopa-Levodopa ER  1 tablet Oral Daily   insulin aspart  0-9 Units Subcutaneous TID AC & HS   lidocaine  1 patch Transdermal Q24H   losartan  50 mg Oral Daily   potassium chloride  30 mEq Oral Q3H   rOPINIRole  2 mg Oral TID   tamsulosin  0.8 mg Oral Daily    Studies/Results: No results found.  Assessment: s/p Procedure(s): LAPAROSCOPIC SUBTOTAL CHOLECYSTECTOMY WITH ICG DYE Patient Active Problem List   Diagnosis Date Noted   Acute cholecystitis 03/12/2023   Primary osteoarthritis, left elbow 02/10/2023   Cyst of skin 12/26/2022   Elevated bilirubin 07/25/2022   Atopic dermatitis 07/15/2022   Acute  cough 06/23/2022   Plantar fasciitis of left foot 01/16/2022   Left leg swelling 01/29/2021   Parkinsonism 01/29/2021   Joint pain 09/14/2020   Pharyngitis 08/01/2020   Insomnia 08/01/2020   Seborrheic keratoses 03/16/2020   Venous stasis dermatitis of both lower extremities 09/03/2019   Eustachian tube dysfunction, bilateral 08/16/2019   Anxiety state 08/16/2019   History of 2019 novel coronavirus disease (COVID-19) 08/16/2019   COVID-19 virus infection 08/07/2019   Hypogonadism in male 01/14/2018   Complete tear of left rotator cuff 10/02/2017   Chronic left shoulder pain 10/02/2017   Acute pain of left shoulder 09/22/2017   GERD (gastroesophageal reflux disease) 07/02/2017   Rosacea 05/29/2017   Unilateral primary osteoarthritis, left knee 09/12/2016   Idiopathic chronic venous hypertension of both lower extremities with inflammation 09/12/2016   Muscle spasm 03/07/2016   Chronic bilateral ankle plain, right total ankle arthroplasty, left ankle posttraumatic osteoarthritis 08/18/2013   ED (erectile dysfunction) 09/10/2011   BPH (benign prostatic hyperplasia) 09/10/2011   Kidney stones 01/28/2011   Type 2 diabetes mellitus without complication, without long-term current use of insulin (HCC) 01/24/2011   OSA on CPAP 01/24/2011   Right lumbar radiculopathy 01/24/2011   Essential hypertension, benign 12/20/2010   Dyslipidemia 12/20/2010    Expected post op course  Plan: Cont diet Cont IV abx for 3 more days Cont JP, monitor output  LOS: 4 days     .Vanita Panda, MD [facility], [place]    03/16/2023 8:04 AM

## 2023-03-16 NOTE — Evaluation (Signed)
Physical Therapy Evaluation Patient Details Name: Jonathan Yu MRN: [id number] DOB: 01/10/1951 Today's Date: 03/16/2023  History of Present Illness  Jonathan Yu is a 72 y.o. male presented with abd pain;CTabd/pelvis - Findings highly concerning for acute cholecystitis.7/2 pt s/p laparoscopic subtotal cholecystectomy. PHMx: HLD, HTN OSA, DM2 , parkinson disease, hypogonadism.  Clinical Impression  Pt admitted with above diagnosis.  Pt cooperative and willing to work with PT; requires ~ min assist for bed mobility, amb ~ 350' with CGA, has intermittent assist from family at home.  Plans to stay downstairs initially as is bedroom is up a flight of stairs; will likely benefit from HHPT at d/c  Pt currently with functional limitations due to the deficits listed below (see PT Problem List). Pt will benefit from acute skilled PT to increase their independence and safety with mobility to allow discharge.           If plan is discharge home, recommend the following: Assist for transportation;Help with stairs or ramp for entrance;Assistance with cooking/housework   Can travel by private vehicle        Equipment Recommendations None recommended by PT  Recommendations for Other Services       Functional Status Assessment Patient has had a recent decline in their functional status and demonstrates the ability to make significant improvements in function in a reasonable and predictable amount of time.     Precautions / Restrictions Precautions Precautions: Fall Precaution Comments: Right JP drain Restrictions Weight Bearing Restrictions: No      Mobility  Bed Mobility Overal bed mobility: Needs Assistance Bed Mobility: Supine to Sit, Sit to Supine     Supine to sit: HOB elevated, Min assist     General bed mobility comments: light assist to elevate trunk, cues to self assist, pt able to progress LEs off bed; light assist to lift LEs fully on to bed; pt has adjustable bed at  home    Transfers Overall transfer level: Needs assistance Equipment used: None Transfers: Sit to/from Stand Sit to Stand: Min guard           General transfer comment: for safety    Ambulation/Gait Ambulation/Gait assistance: Min guard Gait Distance (Feet): 350 Feet Assistive device: Rolling walker (2 wheels) Gait Pattern/deviations: Step-through pattern, Decreased stride length       General Gait Details: LLE positioned in ER> than RLE throughout; pt with steady gait speed, light use of RW and min/guard for safety. no overt LOB  Stairs            Wheelchair Mobility     Tilt Bed    Modified Rankin (Stroke Patients Only)       Balance     Sitting balance-Leahy Scale: Fair Sitting balance - Comments: intermittent posterior lean that he asks to help correct, able to correct with VCs; able to maintain static sit on feet on floor   Standing balance support: No upper extremity supported, During functional activity, Reliant on assistive device for balance Standing balance-Leahy Scale: Fair                               Pertinent Vitals/Pain Pain Assessment Pain Assessment: 0-10 Pain Score: 4  Pain Location: right flank where surgery was Pain Descriptors / Indicators: Grimacing, Sore Pain Intervention(s): Limited activity within patient's tolerance, Monitored during session, Repositioned    Home Living Family/patient expects to be discharged to:: Private residence Living Arrangements: Children Available Help  at Discharge: Family;Available PRN/intermittently Type of Home: House Home Access: Stairs to enter   Entrance Stairs-Number of Steps: 2 Alternate Level Stairs-Number of Steps: flight Home Layout: Two level;Full bath on main level   Additional Comments: 3 kids who live in house with him and can A him when they are home (2 work and 1 in school)    Prior Function Prior Level of Function : Needs assist             Mobility  Comments: amb with  cane       Hand Dominance        Extremity/Trunk Assessment   Upper Extremity Assessment Upper Extremity Assessment: Defer to OT evaluation    Lower Extremity Assessment Lower Extremity Assessment: Overall WFL for tasks assessed       Communication   Communication: No difficulties  Cognition Arousal/Alertness: Awake/alert Behavior During Therapy: WFL for tasks assessed/performed Overall Cognitive Status: Within Functional Limits for tasks assessed                                          General Comments      Exercises     Assessment/Plan    PT Assessment Patient needs continued PT services  PT Problem List Decreased strength;Decreased range of motion;Decreased activity tolerance;Decreased balance;Decreased mobility;Decreased knowledge of use of DME;Pain       PT Treatment Interventions DME instruction;Therapeutic exercise;Gait training;Functional mobility training;Therapeutic activities;Patient/family education;Stair training;Balance training    PT Goals (Current goals can be found in the Care Plan section)  Acute Rehab PT Goals PT Goal Formulation: With patient Time For Goal Achievement: 03/30/23 Potential to Achieve Goals: Good    Frequency Min 1X/week     Co-evaluation               AM-PAC PT "6 Clicks" Mobility  Outcome Measure Help needed turning from your back to your side while in a flat bed without using bedrails?: A Little Help needed moving from lying on your back to sitting on the side of a flat bed without using bedrails?: A Little Help needed moving to and from a bed to a chair (including a wheelchair)?: A Little Help needed standing up from a chair using your arms (e.g., wheelchair or bedside chair)?: A Little Help needed to walk in hospital room?: A Little Help needed climbing 3-5 steps with a railing? : A Lot 6 Click Score: 17    End of Session Equipment Utilized During Treatment: Gait  belt Activity Tolerance: Patient tolerated treatment well Patient left: in bed;with call bell/phone within reach   PT Visit Diagnosis: Other abnormalities of gait and mobility (R26.89);Difficulty in walking, not elsewhere classified (R26.2)    Time: 9147-8295 PT Time Calculation (min) (ACUTE ONLY): 27 min   Charges:   PT Evaluation $PT Eval Low Complexity: 1 Low PT Treatments $Gait Training: 8-22 mins PT General Charges $$ ACUTE PT VISIT: 1 Visit         Edrik Rundle, PT  Acute Rehab Dept [facility]) [phone]  03/16/2023   [facility] 03/16/2023, 1:42 PM

## 2023-03-16 NOTE — Progress Notes (Signed)
   03/16/23 0000  BiPAP/CPAP/SIPAP  $ Non-Invasive Home Ventilator  Subsequent  BiPAP/CPAP/SIPAP Pt Type Adult  BiPAP/CPAP/SIPAP DREAMSTATIOND  Mask Type Nasal mask  Mask Size Medium  FiO2 (%) 21 %  Patient Home Equipment No  Auto Titrate Yes (5-20 cmH2O)

## 2023-03-16 NOTE — Plan of Care (Signed)

## 2023-03-16 NOTE — Progress Notes (Signed)
   03/16/23 2327  BiPAP/CPAP/SIPAP  BiPAP/CPAP/SIPAP Pt Type Adult  BiPAP/CPAP/SIPAP DREAMSTATIOND  Mask Type Nasal mask  FiO2 (%) 21 %  Patient Home Equipment No  Auto Titrate Yes (5-20 cmH2O)

## 2023-03-16 NOTE — Progress Notes (Signed)
PROGRESS NOTE    Jonathan Yu  KVQ:[id number] DOB: Oct 12, 1950 DOA: 03/12/2023 PCP: Everrett Coombe, DO    Brief Narrative:   Jonathan Yu is a 72 y.o. male with past medical history significant for HTN, HLD, type 2 diabetes mellitus, OSA, Parkinson's disease, hypogonadism who presented to [facility] ED with complaints of abdominal pain.  Patient reports he was eating fried chicken livers at a restaurant night prior and felt some stomach upset in which he took Pepcid.  No relief with medication and start developing more severe constant pain to his epigastric region associated with nausea.  Initially thought was due to food poisoning but given his progressive symptoms he sought further care in the ED.  In the ED, temperature 101.8 F, HR 63, RR 20, BP 168/85, SpO2 96% on room air.  WBC 7.6, hemoglobin 14.8, platelets 167.  Sodium 141, potassium 4.1, chloride 106, CO2 26, glucose 217, BUN 12, creatinine 0.98.  AST 23, ALT 11, total bili 1.8.  Lipase 46.  High sensitive troponin 5.  Lactic acid 1.2.  Urinalysis unrevealing.  CT abdomen/pelvis with contrast with findings highly concerning for acute cholecystitis.  Ultrasound abdomen with cholelithiasis with positive sonographic Murphy sign, suspected gallbladder wall thickening, no biliary dilation, hepatic steatosis.  General surgery was consulted.  Patient was started on empiric antibiotics.  TRH consulted for admission and patient was transferred to [facility] for further evaluation management for concern of acute cholecystitis.  Assessment & Plan:   Acute cholecystitis Patient presenting to the ED with acute onset epigastric pain associated with fever and nausea.  WBC count 7.6. CT abdomen/pelvis with contrast with findings highly concerning for acute cholecystitis.  Ultrasound abdomen with cholelithiasis with positive sonographic Murphy sign, suspected gallbladder wall thickening, no biliary dilation.  MRCP  With numerous calls   Wound/blood with  And gallbladder are,  Gallbladder wall thickening without biliary ductal dilation or choledocholithiasis; consistent with acute cholecystitis.  General surgery was consulted and patient underwent subtotal laparoscopic cholecystectomy on 03/14/2023 by Dr. Cliffton Asters with drain placement. -- General surgery following, appreciate assistance -- WBC 7.6>9.5>7.5>5.8 -- Zosyn 3.375 g IV every 8 hours -- Lidoderm patch, Tylenol, Advil PRN mild/moderate pain -- Tramadol 50-100 mg p.o. every 6 hours as needed severe pain -- continue to monitor drain output, 190 mL past 24h -- CBC daily  Elevated total bilirubin Etiology likely secondary to acute cholecystitis; no biliary ductal dilation or choledocholithiasis noted on MRCP. -- Tbili 1.8>>4.5>2.1>1.3 -- CMP daily  Essential hypertension Home regimen includes losartan 50 mg p.o. daily, furosemide 20 mg p.o. daily PRN. -- Restarted losartan 50 mg p.o. daily -- Continue to hold home furosemide  Hyperlipidemia -- Holding home atorvastatin 40 mg p.o. daily  Type 2 diabetes mellitus Home regimen includes metformin 500 mg p.o. twice daily, glipizide 5 mg p.o. twice daily.  Hemoglobin A1c 6.7, well-controlled. -- Hold oral diabetic medications -- Sensitive SSI for coverage -- CBGs every 4 hours while NPO  Parkinson's disease -- Continue Sinemet, BuSpar, Requip  BPH -- Tamsulosin 0.8 mg p.o. daily  OSA -- Nocturnal CPAP  Morbid obesity Body mass index is 43.52 kg/m.  Discussed with patient needs for aggressive lifestyle changes/weight loss as this complicates all facets of care.  Outpatient follow-up with PCP.     DVT prophylaxis: SCDs Start: 03/12/23 2042    Code Status: Full Code Family Communication: No family present at bedside this morning  Disposition Plan:  Level of care: Med-Surg Status is: Inpatient Remains inpatient appropriate  because: IV antibiotics, anticipate discharge home in 1-2 days, pending PT evaluation     Consultants:  General surgery Discussed with [facility], Dr. Marca Ancona on 8/1  Procedures:  Laparoscopic subtotal cholecystectomy with drain placement, Dr. Cliffton Asters 8/2  Antimicrobials:  Zosyn 7/31>>   Subjective: Patient seen and examined at bedside, sitting on edge of bed.  Working with therapy to get to bedside chair in order to eat breakfast.  No specific complaints this morning.  Remains on IV antibiotics.  Pending PT recommendations.  Denies headache, no dizziness, no chest pain, no palpitations, no shortness of breath, no cough/congestion, no fever/chills/night sweats, no current nausea/vomiting, no diarrhea, no focal weakness, no fatigue, no paresthesias.  No acute events overnight per nursing staff.   Objective: Vitals:   03/15/23 0954 03/15/23 1435 03/15/23 2142 03/16/23 0513  BP: (!) 155/83 (!) 145/84 109/69 135/85  Pulse: 64 73 69 69  Resp: 16 16 19 18   Temp: (!) 97.5 F (36.4 C) 97.8 F (36.6 C) 97.6 F (36.4 C) (!) 97.5 F (36.4 C)  TempSrc: Oral Oral Oral Oral  SpO2: 92% 95% 94% 90%  Weight:      Height:        Intake/Output Summary (Last 24 hours) at 03/16/2023 1053 Last data filed at 03/16/2023 0810 Gross per 24 hour  Intake 480 ml  Output 710 ml  Net -230 ml   Filed Weights   03/12/23 0738 03/14/23 1328  Weight: 118.6 kg 118.6 kg    Examination:  Physical Exam: GEN: NAD, alert and oriented x 3, obese HEENT: NCAT, PERRL, EOMI, sclera clear, MMM PULM: CTAB w/o wheezes/crackles, normal respiratory effort, on room air CV: RRR w/o M/G/R GI: abd soft, nondistended, NTTP, + BS, no R/G/M; JP drain noted MSK: + LLE edema that is asymmetric and reported as chronic per patient , muscle strength globally intact 5/5 bilateral upper/lower extremities NEURO: CN II-XII intact, no focal deficits, sensation to light touch intact PSYCH: normal mood/affect Integumentary: dry/intact, no rashes or wounds    Data Reviewed: I have personally reviewed following labs and  imaging studies  CBC: Recent Labs  Lab 03/12/23 0748 03/13/23 0022 03/14/23 0416 03/15/23 0529 03/16/23 0541  WBC 7.6 9.5 7.5 5.8 8.5  HGB 14.8 14.0 14.0 13.5 12.8*  HCT 43.9 42.4 42.4 41.2 39.5  MCV 87.8 90.0 91.0 89.2 90.4  PLT 167 164 165 197 244   Basic Metabolic Panel: Recent Labs  Lab 03/12/23 0748 03/13/23 0022 03/14/23 0416 03/15/23 0529 03/16/23 0541  NA 141 135 136 136 139  K 4.1 3.5 3.7 3.8 3.3*  CL 106 101 101 102 104  CO2 26 25 27 26 26   GLUCOSE 217* 122* 100* 170* 139*  BUN 12 12 12 14 19   CREATININE 0.98 0.92 0.76 0.85 0.83  CALCIUM 9.4 8.4* 8.4* 8.3* 8.9  MG  --  1.9 2.0  --   --   PHOS  --  3.7  --   --   --    GFR: Estimated Creatinine Clearance: 98.7 mL/min (by C-G formula based on SCr of 0.83 mg/dL). Liver Function Tests: Recent Labs  Lab 03/12/23 0748 03/13/23 0022 03/14/23 0416 03/15/23 0529 03/16/23 0541  AST 23 33 28 43* 36  ALT 11 17 40 54* 48*  ALKPHOS 84 50 45 43 42  BILITOT 1.8* 3.7* 4.5* 2.1* 1.3*  PROT 6.8 6.1* 6.5 6.4* 6.3*  ALBUMIN 3.6 3.3* 3.2* 2.9* 3.0*   Recent Labs  Lab 03/12/23 0748  LIPASE  46   No results for input(s): "AMMONIA" in the last 168 hours. Coagulation Profile: No results for input(s): "INR", "PROTIME" in the last 168 hours. Cardiac Enzymes: Recent Labs  Lab 03/13/23 0015  CKTOTAL 74   BNP (last 3 results) No results for input(s): "PROBNP" in the last 8760 hours. HbA1C: No results for input(s): "HGBA1C" in the last 72 hours.  CBG: Recent Labs  Lab 03/15/23 0754 03/15/23 1153 03/15/23 1613 03/15/23 2144 03/16/23 0811  GLUCAP 153* 144* 160* 196* 127*   Lipid Profile: No results for input(s): "CHOL", "HDL", "LDLCALC", "TRIG", "CHOLHDL", "LDLDIRECT" in the last 72 hours. Thyroid Function Tests: No results for input(s): "TSH", "T4TOTAL", "FREET4", "T3FREE", "THYROIDAB" in the last 72 hours. Anemia Panel: No results for input(s): "VITAMINB12", "FOLATE", "FERRITIN", "TIBC", "IRON",  "RETICCTPCT" in the last 72 hours. Sepsis Labs: Recent Labs  Lab 03/13/23 0015 03/13/23 0423  LATICACIDVEN 1.2 1.1    Recent Results (from the past 240 hour(s))  Culture, blood (Routine X 2) w Reflex to ID Panel     Status: None (Preliminary result)   Collection Time: 03/13/23 12:15 AM   Specimen: BLOOD  Result Value Ref Range Status   Specimen Description   Final    BLOOD BLOOD LEFT HAND Performed at [facility], [address], [place], [place] [address]    Special Requests   Final    BOTTLES DRAWN AEROBIC AND ANAEROBIC Blood Culture adequate volume Performed at [facility], [address], [place], [place] [address]    Culture   Final    NO GROWTH 3 DAYS Performed at [facility], [address] [place], [place] [address]    Report Status PENDING  Incomplete  Culture, blood (Routine X 2) w Reflex to ID Panel     Status: None (Preliminary result)   Collection Time: 03/13/23 12:22 AM   Specimen: BLOOD  Result Value Ref Range Status   Specimen Description   Final    BLOOD BLOOD RIGHT HAND Performed at [facility], [address], [place], [place] [address]    Special Requests   Final    BOTTLES DRAWN AEROBIC AND ANAEROBIC Blood Culture adequate volume Performed at [facility], [address], [place], [place] [address]    Culture   Final    NO GROWTH 3 DAYS Performed at [facility], [address], [place], [place] [address]    Report Status PENDING  Incomplete  MRSA Next Gen by PCR, Nasal     Status: None   Collection Time: 03/14/23 10:46 AM   Specimen: Nasal Mucosa; Nasal Swab  Result Value Ref Range Status   MRSA by PCR Next Gen NOT DETECTED NOT DETECTED Final    Comment: (NOTE) The GeneXpert MRSA Assay (FDA approved for NASAL specimens only), is one component of a comprehensive MRSA colonization surveillance program. It is not intended to diagnose MRSA infection nor to  guide or monitor treatment for MRSA infections. Test performance is not FDA approved in patients less than 64 years old. Performed at [facility], [address], [place], [place] [address]          Radiology Studies: No results found.      Scheduled Meds:  acetaminophen  1,000 mg Oral TID   busPIRone  5 mg Oral TID   Carbidopa-Levodopa ER  1 tablet Oral Daily   insulin aspart  0-9 Units Subcutaneous TID AC & HS  lidocaine  1 patch Transdermal Q24H   losartan  50 mg Oral Daily   potassium chloride  30 mEq Oral Q3H   rOPINIRole  2 mg Oral TID   tamsulosin  0.8 mg Oral Daily   Continuous Infusions:  sodium chloride 10 mL/hr at 03/12/23 1250   piperacillin-tazobactam (ZOSYN)  IV 3.375 g (03/16/23 0505)     LOS: 4 days    Time spent: 53 minutes spent on chart review, discussion with nursing staff, consultants, updating family and interview/physical exam; more than 50% of that time was spent in counseling and/or coordination of care.    Alvira Philips [place], DO [facility] Available via Epic secure chat 7am-7pm After these hours, please refer to coverage provider listed on [email or web address] 03/16/2023, 10:53 AM

## 2023-03-17 ENCOUNTER — Inpatient Hospital Stay (HOSPITAL_COMMUNITY): Payer: Medicare HMO

## 2023-03-17 DIAGNOSIS — K810 Acute cholecystitis: Secondary | ICD-10-CM | POA: Diagnosis not present

## 2023-03-17 LAB — GLUCOSE, CAPILLARY
Glucose-Capillary: 135 mg/dL — ABNORMAL HIGH (ref 70–99)
Glucose-Capillary: 108 mg/dL — ABNORMAL HIGH (ref 70–99)
Glucose-Capillary: 103 mg/dL — ABNORMAL HIGH (ref 70–99)
Glucose-Capillary: 97 mg/dL (ref 70–99)

## 2023-03-17 MED ORDER — IOHEXOL 300 MG/ML  SOLN
100.0000 mL | Freq: Once | INTRAMUSCULAR | Status: AC | PRN
  Administered 2023-03-17: 100 mL via INTRAVENOUS

## 2023-03-17 MED ORDER — LORAZEPAM 1 MG PO TABS
1.0000 mg | ORAL_TABLET | Freq: Four times a day (QID) | ORAL | Status: DC | PRN
  Administered 2023-03-18 – 2023-03-23 (×12): 1 mg via ORAL
  Filled 2023-03-17 (×12): qty 1

## 2023-03-17 MED ORDER — DOCUSATE SODIUM 100 MG PO CAPS
100.0000 mg | ORAL_CAPSULE | Freq: Two times a day (BID) | ORAL | Status: DC
  Administered 2023-03-17 – 2023-03-22 (×6): 100 mg via ORAL
  Filled 2023-03-17 (×12): qty 1

## 2023-03-17 MED ORDER — LORAZEPAM 2 MG/ML IJ SOLN
1.0000 mg | Freq: Once | INTRAMUSCULAR | Status: AC
  Administered 2023-03-17: 1 mg via INTRAVENOUS
  Filled 2023-03-17: qty 1

## 2023-03-17 MED ORDER — IOHEXOL 9 MG/ML PO SOLN
ORAL | Status: AC
  Filled 2023-03-17: qty 1000

## 2023-03-17 MED ORDER — POLYETHYLENE GLYCOL 3350 17 G PO PACK
17.0000 g | PACK | Freq: Two times a day (BID) | ORAL | Status: DC
  Administered 2023-03-17 – 2023-03-21 (×3): 17 g via ORAL
  Filled 2023-03-17 (×9): qty 1

## 2023-03-17 MED ORDER — POLYETHYLENE GLYCOL 3350 17 G PO PACK: 17.0000 g | PACK | Freq: Every day | ORAL | Status: DC

## 2023-03-17 MED ORDER — IOHEXOL 9 MG/ML PO SOLN
500.0000 mL | ORAL | Status: AC
  Administered 2023-03-17 (×2): 500 mL via ORAL

## 2023-03-17 MED ORDER — HYDROXYZINE HCL 10 MG PO TABS
10.0000 mg | ORAL_TABLET | Freq: Four times a day (QID) | ORAL | Status: AC | PRN
  Administered 2023-03-17: 10 mg via ORAL
  Filled 2023-03-17: qty 1

## 2023-03-17 NOTE — Progress Notes (Signed)
Occupational Therapy Treatment Patient Details Name: Jonathan Yu MRN: [id number] DOB: 1951-02-01 Today's Date: 03/17/2023   History of present illness Jonathan Yu is a 72 y.o. male presented with abd pain;CTabd/pelvis - Findings highly concerning for acute cholecystitis.7/2 pt s/p laparoscopic subtotal cholecystectomy. PHMx: HLD, HTN OSA, DM2 , parkinson disease, hypogonadism.   OT comments  Patient awaiting additional surgery this am.  Agreeable to light grooming task and in room mobility.  Patient needing up to Mod A for mobility and Mod A for lower body ADL, setup for grooming task seated at edge of bed.  OT continues to be indicated to address deficits, and currently no post acute OT is anticipated.      Recommendations for follow up therapy are one component of a multi-disciplinary discharge planning process, led by the attending physician.  Recommendations may be updated based on patient status, additional functional criteria and insurance authorization.    Assistance Recommended at Discharge PRN  Patient can return home with the following  A little help with walking and/or transfers;A lot of help with bathing/dressing/bathroom;Assistance with cooking/housework;Assist for transportation;Help with stairs or ramp for entrance   Equipment Recommendations  None recommended by OT    Recommendations for Other Services      Precautions / Restrictions Precautions Precautions: Fall Precaution Comments: Right JP drain Restrictions Weight Bearing Restrictions: No       Mobility Bed Mobility   Bed Mobility: Supine to Sit, Sit to Supine     Supine to sit: Mod assist Sit to supine: Mod assist        Transfers Overall transfer level: Needs assistance   Transfers: Sit to/from Stand Sit to Stand: Min guard                 Balance Overall balance assessment: Needs assistance Sitting-balance support: Bilateral upper extremity supported, Feet supported Sitting  balance-Leahy Scale: Fair     Standing balance support: No upper extremity supported, During functional activity, Reliant on assistive device for balance Standing balance-Leahy Scale: Fair                             ADL either performed or assessed with clinical judgement   ADL       Grooming: Set up;Wash/dry hands;Wash/dry face;Oral care;Sitting               Lower Body Dressing: Moderate assistance;Sit to/from stand   Toilet Transfer: Solicitor;Ambulation                  Extremity/Trunk Assessment Upper Extremity Assessment Upper Extremity Assessment: Overall WFL for tasks assessed RUE Deficits / Details: Decreased AROM shoulder due to bad shoulder; resting tremors LUE Deficits / Details: Decreased AROM shoulder due to bad shoulder; resting tremors   Lower Extremity Assessment Lower Extremity Assessment: Defer to PT evaluation   Cervical / Trunk Assessment Cervical / Trunk Assessment: Normal    Vision Patient Visual Report: No change from baseline     Perception Perception Perception: Within Functional Limits   Praxis Praxis Praxis: Intact    Cognition Arousal/Alertness: Awake/alert Behavior During Therapy: WFL for tasks assessed/performed Overall Cognitive Status: Within Functional Limits for tasks assessed                                          Exercises  Shoulder Instructions       General Comments  VSS    Pertinent Vitals/ Pain       Pain Assessment Pain Assessment: No/denies pain Pain Intervention(s): Monitored during session                                                          Frequency  Min 1X/week        Progress Toward Goals  OT Goals(current goals can now be found in the care plan section)  Progress towards OT goals: Progressing toward goals  Acute Rehab OT Goals OT Goal Formulation: With patient Time For Goal Achievement:  03/29/23 Potential to Achieve Goals: Good  Plan Discharge plan remains appropriate    Co-evaluation                 AM-PAC OT "6 Clicks" Daily Activity     Outcome Measure   Help from another person eating meals?: None Help from another person taking care of personal grooming?: None Help from another person toileting, which includes using toliet, bedpan, or urinal?: A Little Help from another person bathing (including washing, rinsing, drying)?: A Lot Help from another person to put on and taking off regular upper body clothing?: A Little Help from another person to put on and taking off regular lower body clothing?: A Lot 6 Click Score: 18    End of Session    OT Visit Diagnosis: Unsteadiness on feet (R26.81);Other abnormalities of gait and mobility (R26.89)   Activity Tolerance Patient tolerated treatment well   Patient Left in bed;with call bell/phone within reach   Nurse Communication Mobility status        Time: 1324-4010 OT Time Calculation (min): 28 min  Charges: OT General Charges $OT Visit: 1 Visit OT Treatments $Self Care/Home Management : 23-37 mins  03/17/2023  RP, OTR/L  Acute Rehabilitation Services  Office:  [phone]   Suzanna Obey 03/17/2023, 9:11 AM

## 2023-03-17 NOTE — Progress Notes (Signed)
PROGRESS NOTE    Jonathan Yu  UUV:[id number] DOB: 08/09/1951 DOA: 03/12/2023 PCP: Everrett Coombe, DO    Brief Narrative:   Jonathan Yu is a 72 y.o. male with past medical history significant for HTN, HLD, type 2 diabetes mellitus, OSA, Parkinson's disease, hypogonadism who presented to [facility] ED with complaints of abdominal pain.  Patient reports he was eating fried chicken livers at a restaurant night prior and felt some stomach upset in which he took Pepcid.  No relief with medication and start developing more severe constant pain to his epigastric region associated with nausea.  Initially thought was due to food poisoning but given his progressive symptoms he sought further care in the ED.  In the ED, temperature 101.8 F, HR 63, RR 20, BP 168/85, SpO2 96% on room air.  WBC 7.6, hemoglobin 14.8, platelets 167.  Sodium 141, potassium 4.1, chloride 106, CO2 26, glucose 217, BUN 12, creatinine 0.98.  AST 23, ALT 11, total bili 1.8.  Lipase 46.  High sensitive troponin 5.  Lactic acid 1.2.  Urinalysis unrevealing.  CT abdomen/pelvis with contrast with findings highly concerning for acute cholecystitis.  Ultrasound abdomen with cholelithiasis with positive sonographic Murphy sign, suspected gallbladder wall thickening, no biliary dilation, hepatic steatosis.  General surgery was consulted.  Patient was started on empiric antibiotics.  TRH consulted for admission and patient was transferred to [facility] for further evaluation management for concern of acute cholecystitis.  Assessment & Plan:   Acute cholecystitis Patient presenting to the ED with acute onset epigastric pain associated with fever and nausea.  WBC count 7.6. CT abdomen/pelvis with contrast with findings highly concerning for acute cholecystitis.  Ultrasound abdomen with cholelithiasis with positive sonographic Murphy sign, suspected gallbladder wall thickening, no biliary dilation.  MRCP  With numerous calls   Wound/blood with  And gallbladder are,  Gallbladder wall thickening without biliary ductal dilation or choledocholithiasis; consistent with acute cholecystitis.  General surgery was consulted and patient underwent subtotal laparoscopic cholecystectomy on 03/14/2023 by Dr. Cliffton Asters with drain placement. -- General surgery following, appreciate assistance -- General surgery reconsulting GI today for concerns of bile leak -- WBC 7.6>9.5>7.5>5.8 -- Zosyn 3.375 g IV every 8 hours -- Lidoderm patch, Tylenol, Advil PRN mild/moderate pain -- Tramadol 50-100 mg p.o. every 6 hours as needed severe pain -- continue to monitor drain output, 200 mL past 24h -- CBC daily  Elevated total bilirubin Etiology likely secondary to acute cholecystitis; no biliary ductal dilation or choledocholithiasis noted on MRCP. -- Tbili 1.8>>4.5>2.1>1.3>1.4 -- CMP daily  Essential hypertension Home regimen includes losartan 50 mg p.o. daily, furosemide 20 mg p.o. daily PRN. -- Restarted losartan 50 mg p.o. daily -- Continue to hold home furosemide  Hyperlipidemia -- Holding home atorvastatin 40 mg p.o. daily  Type 2 diabetes mellitus Home regimen includes metformin 500 mg p.o. twice daily, glipizide 5 mg p.o. twice daily.  Hemoglobin A1c 6.7, well-controlled. -- Hold oral diabetic medications -- Sensitive SSI for coverage -- CBGs every 4 hours while NPO  Parkinson's disease -- Continue Sinemet, BuSpar, Requip  BPH -- Tamsulosin 0.8 mg p.o. daily  OSA -- Nocturnal CPAP  Morbid obesity Body mass index is 43.52 kg/m.  Discussed with patient needs for aggressive lifestyle changes/weight loss as this complicates all facets of care.  Outpatient follow-up with PCP.     DVT prophylaxis: SCDs Start: 03/12/23 2042    Code Status: Full Code Family Communication: No family present at bedside this morning  Disposition Plan:  Level of care: Med-Surg Status is: Inpatient Remains inpatient appropriate because: IV  antibiotics, pending evaluation    Consultants:  General surgery Discussed with [facility], Dr. Marca Ancona on 8/1; GI reconsulted by [facility] today  Procedures:  Laparoscopic subtotal cholecystectomy with drain placement, Dr. Cliffton Asters 8/2  Antimicrobials:  Zosyn 7/31>>   Subjective: Patient seen and examined at bedside, sitting on edge of bed.  Lying in bed watching TV.  No specific complaints.  Worked with therapy this morning.  Remains on IV antibiotics.  Discussed with general surgery, Dr. Magnus Ivan this morning as they are going to reconsult GI today.  Denies headache, no dizziness, no chest pain, no palpitations, no shortness of breath, no cough/congestion, no fever/chills/night sweats, no current nausea/vomiting, no diarrhea, no focal weakness, no fatigue, no paresthesias.  No acute events overnight per nursing staff.   Objective: Vitals:   03/16/23 0513 03/16/23 1437 03/16/23 2109 03/17/23 0541  BP: 135/85 131/74 (!) 155/88 (!) 157/79  Pulse: 69 75 72 60  Resp: 18 18 16 18   Temp: (!) 97.5 F (36.4 C) 98.2 F (36.8 C) 97.8 F (36.6 C) 97.6 F (36.4 C)  TempSrc: Oral Oral Oral Oral  SpO2: 90% 94% 95% 95%  Weight:      Height:        Intake/Output Summary (Last 24 hours) at 03/17/2023 1014 Last data filed at 03/17/2023 0836 Gross per 24 hour  Intake 240 ml  Output 760 ml  Net -520 ml   Filed Weights   03/12/23 0738 03/14/23 1328  Weight: 118.6 kg 118.6 kg    Examination:  Physical Exam: GEN: NAD, alert and oriented x 3, obese HEENT: NCAT, PERRL, EOMI, sclera clear, MMM PULM: CTAB w/o wheezes/crackles, normal respiratory effort, on room air CV: RRR w/o M/G/R GI: abd soft, nondistended, NTTP, + BS, no R/G/M; JP drain noted MSK: + LLE edema that is asymmetric and reported as chronic per patient , muscle strength globally intact 5/5 bilateral upper/lower extremities NEURO: CN II-XII intact, no focal deficits, sensation to light touch intact PSYCH: normal  mood/affect Integumentary: dry/intact, no rashes or wounds    Data Reviewed: I have personally reviewed following labs and imaging studies  CBC: Recent Labs  Lab 03/12/23 0748 03/13/23 0022 03/14/23 0416 03/15/23 0529 03/16/23 0541  WBC 7.6 9.5 7.5 5.8 8.5  HGB 14.8 14.0 14.0 13.5 12.8*  HCT 43.9 42.4 42.4 41.2 39.5  MCV 87.8 90.0 91.0 89.2 90.4  PLT 167 164 165 197 244   Basic Metabolic Panel: Recent Labs  Lab 03/13/23 0022 03/14/23 0416 03/15/23 0529 03/16/23 0541 03/17/23 0430  NA 135 136 136 139 139  K 3.5 3.7 3.8 3.3* 4.1  CL 101 101 102 104 107  CO2 25 27 26 26 25   GLUCOSE 122* 100* 170* 139* 115*  BUN 12 12 14 19 15   CREATININE 0.92 0.76 0.85 0.83 0.85  CALCIUM 8.4* 8.4* 8.3* 8.9 8.5*  MG 1.9 2.0  --   --   --   PHOS 3.7  --   --   --   --    GFR: Estimated Creatinine Clearance: 96.4 mL/min (by C-G formula based on SCr of 0.85 mg/dL). Liver Function Tests: Recent Labs  Lab 03/13/23 0022 03/14/23 0416 03/15/23 0529 03/16/23 0541 03/17/23 0430  AST 33 28 43* 36 33  ALT 17 40 54* 48* 43  ALKPHOS 50 45 43 42 39  BILITOT 3.7* 4.5* 2.1* 1.3* 1.4*  PROT 6.1* 6.5 6.4* 6.3* 6.3*  ALBUMIN 3.3* 3.2* 2.9* 3.0* 3.0*   Recent Labs  Lab 03/12/23 0748  LIPASE 46   No results for input(s): "AMMONIA" in the last 168 hours. Coagulation Profile: No results for input(s): "INR", "PROTIME" in the last 168 hours. Cardiac Enzymes: Recent Labs  Lab 03/13/23 0015  CKTOTAL 74   BNP (last 3 results) No results for input(s): "PROBNP" in the last 8760 hours. HbA1C: No results for input(s): "HGBA1C" in the last 72 hours.  CBG: Recent Labs  Lab 03/16/23 0811 03/16/23 1157 03/16/23 1743 03/16/23 2114 03/17/23 0806  GLUCAP 127* 154* 129* 184* 135*   Lipid Profile: No results for input(s): "CHOL", "HDL", "LDLCALC", "TRIG", "CHOLHDL", "LDLDIRECT" in the last 72 hours. Thyroid Function Tests: No results for input(s): "TSH", "T4TOTAL", "FREET4", "T3FREE",  "THYROIDAB" in the last 72 hours. Anemia Panel: No results for input(s): "VITAMINB12", "FOLATE", "FERRITIN", "TIBC", "IRON", "RETICCTPCT" in the last 72 hours. Sepsis Labs: Recent Labs  Lab 03/13/23 0015 03/13/23 0423  LATICACIDVEN 1.2 1.1    Recent Results (from the past 240 hour(s))  Culture, blood (Routine X 2) w Reflex to ID Panel     Status: None (Preliminary result)   Collection Time: 03/13/23 12:15 AM   Specimen: BLOOD  Result Value Ref Range Status   Specimen Description   Final    BLOOD BLOOD LEFT HAND Performed at [facility], [address] [place], [place] [address]    Special Requests   Final    BOTTLES DRAWN AEROBIC AND ANAEROBIC Blood Culture adequate volume Performed at [facility], [address] [place], [place] [address]    Culture   Final    NO GROWTH 4 DAYS Performed at [facility], [address], [place], [place] [address]    Report Status PENDING  Incomplete  Culture, blood (Routine X 2) w Reflex to ID Panel     Status: None (Preliminary result)   Collection Time: 03/13/23 12:22 AM   Specimen: BLOOD  Result Value Ref Range Status   Specimen Description   Final    BLOOD BLOOD RIGHT HAND Performed at [facility], [address], [place], [place] [address]    Special Requests   Final    BOTTLES DRAWN AEROBIC AND ANAEROBIC Blood Culture adequate volume Performed at [facility], [address] [place], [place] [address]    Culture   Final    NO GROWTH 4 DAYS Performed at [facility], [address], [place], [place] [address]    Report Status PENDING  Incomplete  MRSA Next Gen by PCR, Nasal     Status: None   Collection Time: 03/14/23 10:46 AM   Specimen: Nasal Mucosa; Nasal Swab  Result Value Ref Range Status   MRSA by PCR Next Gen NOT DETECTED NOT DETECTED Final    Comment: (NOTE) The GeneXpert MRSA Assay (FDA approved for NASAL specimens only), is  one component of a comprehensive MRSA colonization surveillance program. It is not intended to diagnose MRSA infection nor to guide or monitor treatment for MRSA infections. Test performance is not FDA approved in patients less than 23 years old. Performed at [facility], [address] [place], [place] [address]          Radiology Studies: No results found.      Scheduled Meds:  acetaminophen  1,000 mg Oral TID   busPIRone  5 mg Oral TID   Carbidopa-Levodopa ER  1 tablet  Oral Daily   docusate sodium  100 mg Oral BID   insulin aspart  0-9 Units Subcutaneous TID AC & HS   lidocaine  1 patch Transdermal Q24H   losartan  50 mg Oral Daily   polyethylene glycol  17 g Oral Daily   rOPINIRole  2 mg Oral TID   tamsulosin  0.8 mg Oral Daily   Continuous Infusions:  sodium chloride 10 mL/hr at 03/12/23 1250   piperacillin-tazobactam (ZOSYN)  IV 3.375 g (03/17/23 0353)     LOS: 5 days    Time spent: 53 minutes spent on chart review, discussion with nursing staff, consultants, updating family and interview/physical exam; more than 50% of that time was spent in counseling and/or coordination of care.    Alvira Philips [place], DO [facility] Available via Epic secure chat 7am-7pm After these hours, please refer to coverage provider listed on [email or web address] 03/17/2023, 10:14 AM

## 2023-03-17 NOTE — Plan of Care (Signed)

## 2023-03-17 NOTE — Care Management Important Message (Signed)
Important Message  Patient Details IM Letter given. Name: Jonathan Yu MRN: [id number] Date of Birth: January 22, 1951   Medicare Important Message Given:  Yes     Caren Macadam 03/17/2023, 10:41 AM

## 2023-03-17 NOTE — Plan of Care (Signed)
  Problem: Health Behavior/Discharge Planning: Goal: Ability to manage health-related needs will improve Outcome: Progressing   Problem: Clinical Measurements: Goal: Diagnostic test results will improve Outcome: Progressing   Problem: Activity: Goal: Risk for activity intolerance will decrease Outcome: Progressing   Problem: Coping: Goal: Level of anxiety will decrease Outcome: Progressing   Problem: Pain Managment: Goal: General experience of comfort will improve Outcome: Progressing

## 2023-03-17 NOTE — Progress Notes (Signed)
3 Days Post-Op  Subjective: CC: Some pain around his incisions and RUQ. Tolerating CLD without n/v or worsening pain. Passing flatus. No BM. Voiding without issues. Mobilizing with therapies.   Last PO intake was prune juice earlier this morning. Currently NPO.   Afebrile. No tachycardia or hypotension. WBC wnl yesterday. T. Bili 1.4 from 1.3. AST/ALT/Alk Phos wnl. JP drain bilious - output up slightly from 190cc to 200cc/24 hours.   Objective: Vital signs in last 24 hours: Temp:  [97.6 F (36.4 C)-98.2 F (36.8 C)] 97.6 F (36.4 C) (08/05 0541) Pulse Rate:  [60-75] 60 (08/05 0541) Resp:  [16-18] 18 (08/05 0541) BP: (131-157)/(74-88) 157/79 (08/05 0541) SpO2:  [94 %-95 %] 95 % (08/05 0541) FiO2 (%):  [21 %] 21 % (08/04 2327) Last BM Date : 03/14/23  Intake/Output from previous day: 08/04 0701 - 08/05 0700 In: 480 [P.O.:480] Out: 800 [Urine:600; Drains:200] Intake/Output this shift: Total I/O In: -  Out: 200 [Urine:200]  PE: Gen:  Alert, NAD, pleasant Card:  Reg Pulm:  Rate and effort normal Abd: Soft, mild distension, appropriately tender around laparoscopic incisions, no rigidity or guarding and otherwise NT, +BS. Incisions with glue intact appears well and are without drainage, bleeding, or signs of infection. JP drain bilious  Psych: A&Ox3   Lab Results:  Recent Labs    03/15/23 0529 03/16/23 0541  WBC 5.8 8.5  HGB 13.5 12.8*  HCT 41.2 39.5  PLT 197 244   BMET Recent Labs    03/16/23 0541 03/17/23 0430  NA 139 139  K 3.3* 4.1  CL 104 107  CO2 26 25  GLUCOSE 139* 115*  BUN 19 15  CREATININE 0.83 0.85  CALCIUM 8.9 8.5*   PT/INR No results for input(s): "LABPROT", "INR" in the last 72 hours. CMP     Component Value Date/Time   NA 139 03/17/2023 0430   K 4.1 03/17/2023 0430   CL 107 03/17/2023 0430   CO2 25 03/17/2023 0430   GLUCOSE 115 (H) 03/17/2023 0430   BUN 15 03/17/2023 0430   CREATININE 0.85 03/17/2023 0430   CREATININE 0.88  07/18/2022 1333   CALCIUM 8.5 (L) 03/17/2023 0430   PROT 6.3 (L) 03/17/2023 0430   ALBUMIN 3.0 (L) 03/17/2023 0430   AST 33 03/17/2023 0430   ALT 43 03/17/2023 0430   ALKPHOS 39 03/17/2023 0430   BILITOT 1.4 (H) 03/17/2023 0430   GFRNONAA >60 03/17/2023 0430   GFRNONAA 75 03/16/2020 1038   GFRAA 87 03/16/2020 1038   Lipase     Component Value Date/Time   LIPASE 46 03/12/2023 0748    Studies/Results: No results found.  Anti-infectives: Anti-infectives (From admission, onward)    Start     Dose/Rate Route Frequency Ordered Stop   03/12/23 2000  piperacillin-tazobactam (ZOSYN) IVPB 3.375 g        3.375 g 12.5 mL/hr over 240 Minutes Intravenous Every 8 hours 03/12/23 1230     03/12/23 1245  piperacillin-tazobactam (ZOSYN) IVPB 3.375 g        3.375 g 100 mL/hr over 30 Minutes Intravenous  Once 03/12/23 1230 03/12/23 1325        Assessment/Plan POD 3 s/p Laparoscopic subtotal cholecystectomy for acute on chronic gangrenous cholecystitis by Dr. Cliffton Asters on 03/14/23 - Cont abx 5d post op. Afebrile. WBC wnl on last check - JP drain bilious. GI consult - Cont to trend labs - Mobilize - therapies  - Pulm toilet   FEN - NPO for GI  consult. IVF per TRH VTE - SCDs, okay for chem ppx from our standpoint ID - Zosyn   LOS: 5 days    Jacinto Halim , [facility] Surgery 03/17/2023, 9:57 AM Please see Amion for pager number during day hours 7:00am-4:30pm

## 2023-03-17 NOTE — Consult Note (Signed)
Referring Provider: [facility] Primary Care Physician:  Everrett Coombe, DO Primary Gastroenterologist: Gentry Fitz  Reason for Consultation: Evaluate for possible bile leak  HPI: Jonathan Yu is a 72 y.o. male with past medical history of Parkinson's disease, chronic constipation, hypertension, diabetes and sleep apnea initially presented to the hospital with abdominal pain.  He was diagnosed with cholecystitis.  Underwent laparoscopic cholecystectomy in March 14, 2023 and subtotal cholecystectomy was performed and JP drain was placed.  Patient continues to have around 200 cc output from JP drain and GI is asked to evaluate patient for possible bile leak.  Patient's LFTs are normal with T. bili of 1.4.  Normal white count yesterday.  Patient seen and examined at bedside.  His right upper quadrant abdominal pain has improved but he is complaining of central abdominal pain since surgery.  Complaining of nausea.  Denies any blood in the stool or black stool.  He is somewhat anxious.  Past Medical History:  Diagnosis Date   Allergy    DDD (degenerative disc disease), lumbar    Hyperlipidemia    "no meds since losing 106# 2 yr ago" (10/06/2013)   Hypertension 08/12/2010   "no meds since losing 106# 2 yr ago" (10/06/2013)   OSA on CPAP    "don't wear mask much since losing 106# 2 yr ago" (10/06/2013)   Parkinson's disease    Type II diabetes mellitus (HCC)    "no meds since losing 106# 2 yr ago" (10/06/2013)   Walking pneumonia 08/12/1966    Past Surgical History:  Procedure Laterality Date   ANKLE FRACTURE SURGERY Left 1973   ANKLE RECONSTRUCTION Right 05/2011   CARPAL TUNNEL RELEASE Bilateral 2001   CHOLECYSTECTOMY N/A 03/14/2023   Procedure: LAPAROSCOPIC SUBTOTAL CHOLECYSTECTOMY WITH ICG DYE;  Surgeon: Andria Meuse, MD;  Location: [facility];  Service: General;  Laterality: N/A;   HARDWARE REMOVAL Right 10/07/2013   Procedure: RIGHT ANKLE HARDWARE REMOVAL;  Surgeon: Nadara Mustard, MD;   Location: [facility];  Service: Orthopedics;  Laterality: Right;   I & D EXTREMITY Right 11/18/2013   Procedure: IRRIGATION AND DEBRIDEMENT EXTREMITY;  Surgeon: Nadara Mustard, MD;  Location: [facility];  Service: Orthopedics;  Laterality: Right;  Irrigation and Debridement Right Fibula , Place Antibiotic Beads and VAC   NASAL SEPTUM SURGERY  1982   ORIF ANKLE FRACTURE Right 09/08/2013   Procedure: REPAIR SYNDESMOSIS DISRUPTION RIGHT ANKLE;  Surgeon: Nadara Mustard, MD;  Location: [facility];  Service: Orthopedics;  Laterality: Right;   SHOULDER ARTHROSCOPY W/ ROTATOR CUFF REPAIR Right 2006; 2009   TONSILLECTOMY  1959   TOTAL ANKLE ARTHROPLASTY Right 08/18/2013   Procedure: TOTAL ANKLE ARTHOPLASTY;  Surgeon: Nadara Mustard, MD;  Location: [facility];  Service: Orthopedics;  Laterality: Right;  Right Total Ankle Arthroplasty, Revision Fibular Fracture   URETERAL STENT PLACEMENT  ~ 2010 X 2    Prior to Admission medications   Medication Sig Start Date End Date Taking? Authorizing Provider  albuterol (PROVENTIL HFA;VENTOLIN HFA) 108 (90 Base) MCG/ACT inhaler Inhale 2 puffs into the lungs every 6 (six) hours as needed for wheezing or shortness of breath. 07/31/17  Yes Rodolph Bong, MD  AMBULATORY NON FORMULARY MEDICATION Continue CPAP at current settings.  Please provide new supplies. [name] phone number [phone] 06/03/19  Yes Rodolph Bong, MD  AMBULATORY NON FORMULARY MEDICATION Compression stockings.  Vive size LGM Leg swelling. Disp 1 pair 99 refil. 06/09/19  Yes Rodolph Bong, MD  anastrozole (ARIMIDEX) 1  MG tablet Take 1 tablet (1 mg total) by mouth every Monday, Wednesday, and Friday. 01/03/23  Yes Everrett Coombe, DO  atorvastatin (LIPITOR) 40 MG tablet TAKE 1 TABLET EVERY DAY Patient taking differently: Take 40 mg by mouth daily. 09/27/22  Yes Everrett Coombe, DO  Azelaic Acid 15 % cream Apply 1 application topically 2 (two) times daily. Patient taking differently: Apply 1 application  topically 2 (two)  times daily as needed (flares). 06/03/19  Yes Rodolph Bong, MD  busPIRone (BUSPAR) 5 MG tablet Take 1 tablet (5 mg total) by mouth 4 (four) times daily as needed. Patient taking differently: Take 5 mg by mouth 3 (three) times daily. 03/26/22  Yes Butch Penny, NP  Carbidopa-Levodopa ER (SINEMET CR) 25-100 MG tablet controlled release Take 1 tablet by mouth daily. Dose will increase by 1 tablet each week until taking 3 times daily 02/26/23 03/11/24 Yes [provider]  fluticasone (FLONASE) 50 MCG/ACT nasal spray Place 1 spray into both nostrils as needed for allergies.   Yes [provider]  furosemide (LASIX) 20 MG tablet TAKE 1 TABLET BY MOUTH ONCE DAILY AS NEEDED FOR EDEMA Patient taking differently: Take 20 mg by mouth as needed for edema or fluid. 03/12/22  Yes Everrett Coombe, DO  glipiZIDE (GLUCOTROL) 5 MG tablet Take 1 tablet (5 mg total) by mouth 2 (two) times daily before a meal. 01/02/23  Yes Everrett Coombe, DO  LINZESS 145 MCG CAPS capsule Take 145 mcg by mouth daily. 02/05/23  Yes [provider]  loratadine (CLARITIN) 10 MG tablet Take 10 mg by mouth daily as needed for allergies.   Yes [provider]  losartan (COZAAR) 50 MG tablet TAKE 1 TABLET EVERY DAY Patient taking differently: Take 50 mg by mouth daily. 09/27/22  Yes Everrett Coombe, DO  metFORMIN (GLUCOPHAGE-XR) 500 MG 24 hr tablet Take 1 tablet (500 mg total) by mouth 2 (two) times daily with a meal. 01/02/23  Yes Everrett Coombe, DO  Misc. Devices (FLEX THERAPY) MISC by Does not apply route.   Yes [provider]  mometasone (ELOCON) 0.1 % cream Apply 1 Application topically daily. 07/15/22  Yes Everrett Coombe, DO  Multiple Vitamin (MULTIVITAMIN WITH MINERALS) TABS tablet Take 1 tablet by mouth daily.   Yes [provider]  Probiotic Product (PROBIOTIC PO) Take 2 capsules by mouth daily.   Yes [provider]  rOPINIRole (REQUIP) 2 MG tablet TAKE 1 TABLET THREE TIMES  DAILY 10/14/22  Yes Huston Foley, MD  tadalafil (CIALIS) 20 MG tablet TAKE 1/2 TO 1 (ONE-HALF TO ONE) TABLET BY MOUTH EVERY OTHER DAY FOR ERECTILE DYSFUNCTION Patient taking differently: Take 10-20 mg by mouth every other day. FOR ERECTILE DYSFUNCTION 01/27/23  Yes Everrett Coombe, DO  tamsulosin (FLOMAX) 0.4 MG CAPS capsule Take 2 capsules (0.8 mg total) by mouth daily. 01/02/23  Yes Everrett Coombe, DO  traMADol (ULTRAM) 50 MG tablet TAKE 1 TABLET BY MOUTH EVERY 8 HOURS AS NEEDED FOR MODERATE PAIN Patient taking differently: Take 50 mg by mouth as needed for moderate pain (usually take on fridays). 12/11/22  Yes Monica Becton, MD  triamcinolone cream (KENALOG) 0.1 % Apply 1 Application topically as needed (flares). 02/03/23  Yes [provider]  VITAMIN D PO Take 2,000 Units by mouth daily.   Yes [provider]  benzonatate (TESSALON) 200 MG capsule Take 1 capsule (200 mg total) by mouth 2 (two) times daily as needed for cough. Patient not taking: Reported on 03/12/2023 06/20/22  Everrett Coombe, DO    Scheduled Meds:  acetaminophen  1,000 mg Oral TID   busPIRone  5 mg Oral TID   Carbidopa-Levodopa ER  1 tablet Oral Daily   docusate sodium  100 mg Oral BID   insulin aspart  0-9 Units Subcutaneous TID AC & HS   lidocaine  1 patch Transdermal Q24H   losartan  50 mg Oral Daily   polyethylene glycol  17 g Oral Daily   rOPINIRole  2 mg Oral TID   tamsulosin  0.8 mg Oral Daily   Continuous Infusions:  sodium chloride 10 mL/hr at 03/12/23 1250   piperacillin-tazobactam (ZOSYN)  IV 3.375 g (03/17/23 0353)   PRN Meds:.sodium chloride, HYDROmorphone (DILAUDID) injection, ibuprofen, ondansetron **OR** ondansetron (ZOFRAN) IV, traMADol  Allergies as of 03/12/2023 - Review Complete 03/12/2023  Allergen Reaction Noted   Sesame oil Hives and Shortness Of Breath 08/18/2013   Clonazepam Hives 08/07/2019   Tape Other (See Comments) 04/23/2021   Percocet [oxycodone-acetaminophen]  Rash 09/06/2013   Soy allergy Hives 01/16/2022   Topamax [topiramate] Other (See Comments) 09/23/2014   Vicodin [hydrocodone-acetaminophen] Rash 09/06/2013    Family History  Problem Relation Age of Onset   Cancer Mother 23       Lung   Alzheimer's disease Father    Parkinson's disease Father    Scoliosis Sister    Cleft lip Sister     Social History   Socioeconomic History   Marital status: Divorced    Spouse name: Not on file   Number of children: 6   Years of education: 14   Highest education level: Some college, no degree  Occupational History   Occupation: sales/ DJ    Comment: part time  Tobacco Use   Smoking status: Some Days    Types: Pipe, Cigars   Smokeless tobacco: Never   Tobacco comments:    cigar 1-2 times/[place]  Vaping Use   Vaping status: Never Used  Substance and Sexual Activity   Alcohol use: Yes    Comment: 1-2 drinks per year.   Drug use: No   Sexual activity: Yes  Other Topics Concern   Not on file  Social History Narrative   Lives with his son and his daughter. He does Hotel manager, enjoys singing, music and yard work (when he can).    Social Determinants of Health   Financial Resource Strain: Low Risk  (05/03/2022)   Overall Financial Resource Strain (CARDIA)    Difficulty of Paying Living Expenses: Not hard at all  Food Insecurity: No Food Insecurity (03/13/2023)   Hunger Vital Sign    Worried About Running Out of Food in the Last Year: Never true    Ran Out of Food in the Last Year: Never true  Transportation Needs: No Transportation Needs (03/13/2023)   PRAPARE - Administrator, Civil Service (Medical): No    Lack of Transportation (Non-Medical): No  Physical Activity: Insufficiently Active (05/03/2022)   Exercise Vital Sign    Days of Exercise per Week: 1 day    Minutes of Exercise per Session: 60 min  Stress: No Stress Concern Present (05/03/2022)   [employer] of Occupational Health - Occupational Stress Questionnaire     Feeling of Stress : Not at all  Social Connections: Unknown (01/15/2023)   Received from [facility]   Social Network    Social Network: Not on file  Intimate Partner Violence: Not At Risk (03/13/2023)   Humiliation, Afraid, Rape, and Kick  questionnaire    Fear of Current or Ex-Partner: No    Emotionally Abused: No    Physically Abused: No    Sexually Abused: No    Review of Systems: All negative except as stated above in HPI.  Physical Exam: Vital signs: Vitals:   03/16/23 2109 03/17/23 0541  BP: (!) 155/88 (!) 157/79  Pulse: 72 60  Resp: 16 18  Temp: 97.8 F (36.6 C) 97.6 F (36.4 C)  SpO2: 95% 95%   Last BM Date : 03/14/23 General:   Alert,  Well-developed, well-nourished, pleasant and cooperative in NAD Lungs: No visible respiratory distress Heart:  Regular rate and rhythm; no murmurs, clicks, rubs,  or gallops. Abdomen: Significantly distended abdomen with guarding on physical exam, central abdominal tenderness to palpation as well as right upper quadrant tenderness to palpation, bowel sounds present. Rectal:  Deferred Anxious patient  GI:  Lab Results: Recent Labs    03/15/23 0529 03/16/23 0541  WBC 5.8 8.5  HGB 13.5 12.8*  HCT 41.2 39.5  PLT 197 244   BMET Recent Labs    03/15/23 0529 03/16/23 0541 03/17/23 0430  NA 136 139 139  K 3.8 3.3* 4.1  CL 102 104 107  CO2 26 26 25   GLUCOSE 170* 139* 115*  BUN 14 19 15   CREATININE 0.85 0.83 0.85  CALCIUM 8.3* 8.9 8.5*   LFT Recent Labs    03/17/23 0430  PROT 6.3*  ALBUMIN 3.0*  AST 33  ALT 43  ALKPHOS 39  BILITOT 1.4*   PT/INR No results for input(s): "LABPROT", "INR" in the last 72 hours.   Studies/Results: No results found.  Impression/Plan: -Acute cholecystitis s/p laparoscopic subtotal cholecystectomy few days ago.  Now with increased JP drain in worsening central abdominal pain.  Right upper quadrant pain has improved.  Concern for bile leak -Parkinson's  disease -Chronic constipation  Recommendations -------------------------- -Patient's abdomen is significantly distended and tense as well as has a guarding on physical exam.  Recommend repeat CT for further evaluation.   -Continue IV Zosyn for now -Increase MiraLAX to twice a day -No plan for ERCP today.  Further management based on CT scan findings.    LOS: 5 days   Kathi Der  MD, FACP 03/17/2023, 12:41 PM  Contact #  [phone]

## 2023-03-18 ENCOUNTER — Inpatient Hospital Stay (HOSPITAL_COMMUNITY): Payer: Medicare HMO | Admitting: Certified Registered Nurse Anesthetist

## 2023-03-18 ENCOUNTER — Encounter (HOSPITAL_COMMUNITY)
Admission: EM | Disposition: A | Discharge status: Home / Self Care | Payer: Self-pay | Source: Home / Self Care | Attending: Internal Medicine

## 2023-03-18 ENCOUNTER — Inpatient Hospital Stay (HOSPITAL_COMMUNITY): Payer: Medicare HMO

## 2023-03-18 ENCOUNTER — Encounter (HOSPITAL_COMMUNITY): Payer: Self-pay | Admitting: Internal Medicine

## 2023-03-18 DIAGNOSIS — K9189 Other postprocedural complications and disorders of digestive system: Secondary | ICD-10-CM | POA: Diagnosis not present

## 2023-03-18 DIAGNOSIS — Z7984 Long term (current) use of oral hypoglycemic drugs: Secondary | ICD-10-CM

## 2023-03-18 DIAGNOSIS — K838 Other specified diseases of biliary tract: Secondary | ICD-10-CM

## 2023-03-18 DIAGNOSIS — K269 Duodenal ulcer, unspecified as acute or chronic, without hemorrhage or perforation: Secondary | ICD-10-CM

## 2023-03-18 DIAGNOSIS — E119 Type 2 diabetes mellitus without complications: Secondary | ICD-10-CM | POA: Diagnosis not present

## 2023-03-18 DIAGNOSIS — K810 Acute cholecystitis: Secondary | ICD-10-CM | POA: Diagnosis not present

## 2023-03-18 HISTORY — PX: BILIARY STENT PLACEMENT: SHX5538

## 2023-03-18 HISTORY — PX: SPHINCTEROTOMY: SHX5544

## 2023-03-18 HISTORY — PX: ERCP: SHX5425

## 2023-03-18 LAB — GLUCOSE, CAPILLARY
Glucose-Capillary: 98 mg/dL (ref 70–99)
Glucose-Capillary: 121 mg/dL — ABNORMAL HIGH (ref 70–99)
Glucose-Capillary: 159 mg/dL — ABNORMAL HIGH (ref 70–99)
Glucose-Capillary: 296 mg/dL — ABNORMAL HIGH (ref 70–99)

## 2023-03-18 LAB — CULTURE, BLOOD (ROUTINE X 2)
Culture: NO GROWTH
Culture: NO GROWTH
Special Requests: ADEQUATE
Special Requests: ADEQUATE

## 2023-03-18 SURGERY — ERCP, WITH INTERVENTION IF INDICATED
Anesthesia: General

## 2023-03-18 MED ORDER — SUGAMMADEX SODIUM 200 MG/2ML IV SOLN
INTRAVENOUS | Status: DC | PRN
  Administered 2023-03-18: 500 mg via INTRAVENOUS

## 2023-03-18 MED ORDER — DICLOFENAC SUPPOSITORY 100 MG
RECTAL | Status: DC | PRN
  Administered 2023-03-18: 100 mg via RECTAL

## 2023-03-18 MED ORDER — SODIUM CHLORIDE 0.9 % IV SOLN: INTRAVENOUS | Status: DC

## 2023-03-18 MED ORDER — FENTANYL CITRATE (PF) 100 MCG/2ML IJ SOLN
INTRAMUSCULAR | Status: AC
  Filled 2023-03-18: qty 2

## 2023-03-18 MED ORDER — INDOMETHACIN 50 MG RE SUPP
RECTAL | Status: AC
  Filled 2023-03-18: qty 2

## 2023-03-18 MED ORDER — LIDOCAINE 2% (20 MG/ML) 5 ML SYRINGE
INTRAMUSCULAR | Status: DC | PRN
  Administered 2023-03-18: 100 mg via INTRAVENOUS

## 2023-03-18 MED ORDER — DEXAMETHASONE SODIUM PHOSPHATE 4 MG/ML IJ SOLN
INTRAMUSCULAR | Status: DC | PRN
  Administered 2023-03-18: 10 mg via INTRAVENOUS

## 2023-03-18 MED ORDER — DICLOFENAC SUPPOSITORY 100 MG
RECTAL | Status: AC
  Filled 2023-03-18: qty 1

## 2023-03-18 MED ORDER — HYDRALAZINE HCL 20 MG/ML IJ SOLN
10.0000 mg | Freq: Once | INTRAMUSCULAR | Status: AC | PRN
  Administered 2023-03-18: 10 mg via INTRAVENOUS
  Filled 2023-03-18: qty 1

## 2023-03-18 MED ORDER — ONDANSETRON HCL 4 MG/2ML IJ SOLN
INTRAMUSCULAR | Status: DC | PRN
  Administered 2023-03-18: 4 mg via INTRAVENOUS

## 2023-03-18 MED ORDER — PROPOFOL 10 MG/ML IV BOLUS
INTRAVENOUS | Status: DC | PRN
  Administered 2023-03-18: 200 mg via INTRAVENOUS

## 2023-03-18 MED ORDER — GLUCAGON HCL RDNA (DIAGNOSTIC) 1 MG IJ SOLR
INTRAMUSCULAR | Status: AC
  Filled 2023-03-18: qty 1

## 2023-03-18 MED ORDER — ROCURONIUM BROMIDE 10 MG/ML (PF) SYRINGE
PREFILLED_SYRINGE | INTRAVENOUS | Status: DC | PRN
  Administered 2023-03-18: 50 mg via INTRAVENOUS

## 2023-03-18 MED ORDER — SODIUM CHLORIDE 0.9 % IV SOLN
INTRAVENOUS | Status: DC | PRN
  Administered 2023-03-18: 15 mL

## 2023-03-18 MED ORDER — LACTATED RINGERS IV SOLN: INTRAVENOUS | Status: DC

## 2023-03-18 MED ORDER — PANTOPRAZOLE SODIUM 40 MG IV SOLR
40.0000 mg | INTRAVENOUS | Status: DC
  Administered 2023-03-18 – 2023-03-21 (×4): 40 mg via INTRAVENOUS
  Filled 2023-03-18 (×4): qty 10

## 2023-03-18 MED ORDER — CIPROFLOXACIN IN D5W 400 MG/200ML IV SOLN
INTRAVENOUS | Status: AC
  Filled 2023-03-18: qty 200

## 2023-03-18 MED ORDER — FENTANYL CITRATE (PF) 100 MCG/2ML IJ SOLN
INTRAMUSCULAR | Status: DC | PRN
  Administered 2023-03-18: 100 ug via INTRAVENOUS

## 2023-03-18 NOTE — Progress Notes (Signed)
PROGRESS NOTE    Jonathan Yu  WUX:[id number] DOB: [birth year] DOA: 03/12/2023 PCP: Everrett Coombe, DO    Brief Narrative:   Jonathan Yu is a 72 y.o. male with past medical history significant for HTN, HLD, type 2 diabetes mellitus, OSA, Parkinson's disease, hypogonadism who presented to [facility] ED with complaints of abdominal pain.  Patient reports he was eating fried chicken livers at a restaurant night prior and felt some stomach upset in which he took Pepcid.  No relief with medication and start developing more severe constant pain to his epigastric region associated with nausea.  Initially thought was due to food poisoning but given his progressive symptoms he sought further care in the ED.  In the ED, temperature 101.8 F, HR 63, RR 20, BP 168/85, SpO2 96% on room air.  WBC 7.6, hemoglobin 14.8, platelets 167.  Sodium 141, potassium 4.1, chloride 106, CO2 26, glucose 217, BUN 12, creatinine 0.98.  AST 23, ALT 11, total bili 1.8.  Lipase 46.  High sensitive troponin 5.  Lactic acid 1.2.  Urinalysis unrevealing.  CT abdomen/pelvis with contrast with findings highly concerning for acute cholecystitis.  Ultrasound abdomen with cholelithiasis with positive sonographic Murphy sign, suspected gallbladder wall thickening, no biliary dilation, hepatic steatosis.  General surgery was consulted.  Patient was started on empiric antibiotics.  TRH consulted for admission and patient was transferred to [facility] for further evaluation management for concern of acute cholecystitis.  Assessment & Plan:   Acute cholecystitis/P subtotal cholecystectomy Can turn for bile leak Patient presenting to the ED with acute onset epigastric pain associated with fever and nausea.  WBC count 7.6. CT abdomen/pelvis with contrast with findings highly concerning for acute cholecystitis.  Ultrasound abdomen with cholelithiasis with positive sonographic Murphy sign, suspected gallbladder wall  thickening, no biliary dilation.  MRCP  With numerous calls  Wound/blood with  And gallbladder are,  Gallbladder wall thickening without biliary ductal dilation or choledocholithiasis; consistent with acute cholecystitis.  General surgery was consulted and patient underwent subtotal laparoscopic cholecystectomy on 03/14/2023 by Dr. Cliffton Asters with drain placement.  Repeat CT abdomen/pelvis 8/5 with noted evacuation of gallbladder with surgical drain noted, numerous calcified gallstones within the gallbladder fossa, no loculated perihepatic/intra-abdominal fluid collections -- General surgery/[facility] following, appreciate assistance -- WBC 7.6>9.5>7.5>5.8>6.2 -- Zosyn 3.375 g IV every 8 hours -- Lidoderm patch, Tylenol, Advil PRN mild/moderate pain -- Tramadol 50-100 mg p.o. every 6 hours as needed severe pain -- continue to monitor drain output, 280 mL past 24h -- CBC daily -- N.p.o. for ERCP with stent placement for concern of bile leak  Elevated total bilirubin Etiology likely secondary to acute cholecystitis; no biliary ductal dilation or choledocholithiasis noted on MRCP. -- Tbili 1.8>>4.5>2.1>1.3>1.4>1.5 -- CMP daily  Essential hypertension Home regimen includes losartan 50 mg p.o. daily, furosemide 20 mg p.o. daily PRN. -- Restarted losartan 50 mg p.o. daily -- Continue to hold home furosemide  Hyperlipidemia -- Holding home atorvastatin 40 mg p.o. daily  Type 2 diabetes mellitus Home regimen includes metformin 500 mg p.o. twice daily, glipizide 5 mg p.o. twice daily.  Hemoglobin A1c 6.7, well-controlled. -- Hold oral diabetic medications -- Sensitive SSI for coverage -- CBGs every 4 hours while NPO  Parkinson's disease -- Continue Sinemet, BuSpar, Requip  BPH -- Tamsulosin 0.8 mg p.o. daily  OSA -- Nocturnal CPAP  Morbid obesity Body mass index is 43.51 kg/m.  Discussed with patient needs for aggressive lifestyle changes/weight loss as this complicates all facets of  care.   Outpatient follow-up with PCP.     DVT prophylaxis: SCDs Start: 03/12/23 2042    Code Status: Full Code Family Communication: No family present at bedside this morning  Disposition Plan:  Level of care: Med-Surg Status is: Inpatient Remains inpatient appropriate because: IV antibiotics, pending evaluation    Consultants:  General surgery Discussed with [facility], Dr. Marca Ancona on 8/1; GI reconsulted by [facility] 8/5  Procedures:  Laparoscopic subtotal cholecystectomy with drain placement, Dr. Cliffton Asters 8/2 ERCP, Dr. Ewing Schlein 8/6  Antimicrobials:  Zosyn 7/31>>   Subjective: Patient seen and examined at bedside, sitting in bedside chair.  In good spirits, watching the Olympics on TV.  Planned ERCP with possible stent placement for concern of bile leak.  Continues with increased output from his surgical drain.  No questions or concerns this morning.  Denies headache, no dizziness, no chest pain, no palpitations, no shortness of breath, no cough/congestion, no fever/chills/night sweats, no current nausea/vomiting, no diarrhea, no focal weakness, no fatigue, no paresthesias.  No acute events overnight per nursing staff.   Objective: Vitals:   03/18/23 0019 03/18/23 0525 03/18/23 0758 03/18/23 1225  BP: (!) 158/98 (!) 184/91 (!) 163/103 (!) 158/75  Pulse: (!) 52 (!) 51 64 73  Resp:  16  16  Temp:  (!) 97.5 F (36.4 C)  98.2 F (36.8 C)  TempSrc:  Oral  Temporal  SpO2:  97%  98%  Weight:    118.6 kg  Height:    5\' 5"  (1.651 m)    Intake/Output Summary (Last 24 hours) at 03/18/2023 1255 Last data filed at 03/18/2023 1103 Gross per 24 hour  Intake 1241.53 ml  Output 1930 ml  Net -688.47 ml   Filed Weights   03/12/23 0738 03/14/23 1328 03/18/23 1225  Weight: 118.6 kg 118.6 kg 118.6 kg    Examination:  Physical Exam: GEN: NAD, alert and oriented x 3, obese HEENT: NCAT, PERRL, EOMI, sclera clear, MMM PULM: CTAB w/o wheezes/crackles, normal respiratory effort, on room air CV: RRR w/o  M/G/R GI: abd soft, nondistended, NTTP, + BS, no R/G/M; JP drain noted MSK: + LLE edema that is asymmetric and reported as chronic per patient , muscle strength globally intact 5/5 bilateral upper/lower extremities NEURO: CN II-XII intact, no focal deficits, sensation to light touch intact PSYCH: normal mood/affect Integumentary: dry/intact, no rashes or wounds    Data Reviewed: I have personally reviewed following labs and imaging studies  CBC: Recent Labs  Lab 03/13/23 0022 03/14/23 0416 03/15/23 0529 03/16/23 0541 03/18/23 0430  WBC 9.5 7.5 5.8 8.5 6.2  HGB 14.0 14.0 13.5 12.8* 13.0  HCT 42.4 42.4 41.2 39.5 40.5  MCV 90.0 91.0 89.2 90.4 91.2  PLT 164 165 197 244 210   Basic Metabolic Panel: Recent Labs  Lab 03/13/23 0022 03/14/23 0416 03/15/23 0529 03/16/23 0541 03/17/23 0430 03/18/23 0430  NA 135 136 136 139 139 137  K 3.5 3.7 3.8 3.3* 4.1 3.8  CL 101 101 102 104 107 102  CO2 25 27 26 26 25 25   GLUCOSE 122* 100* 170* 139* 115* 106*  BUN 12 12 14 19 15 10   CREATININE 0.92 0.76 0.85 0.83 0.85 0.72  CALCIUM 8.4* 8.4* 8.3* 8.9 8.5* 8.7*  MG 1.9 2.0  --   --   --   --   PHOS 3.7  --   --   --   --   --    GFR: Estimated Creatinine Clearance: 102.4 mL/min (by C-G  formula based on SCr of 0.72 mg/dL). Liver Function Tests: Recent Labs  Lab 03/14/23 0416 03/15/23 0529 03/16/23 0541 03/17/23 0430 03/18/23 0430  AST 28 43* 36 33 27  ALT 40 54* 48* 43 29  ALKPHOS 45 43 42 39 36*  BILITOT 4.5* 2.1* 1.3* 1.4* 1.5*  PROT 6.5 6.4* 6.3* 6.3* 6.0*  ALBUMIN 3.2* 2.9* 3.0* 3.0* 2.9*   Recent Labs  Lab 03/12/23 0748  LIPASE 46   No results for input(s): "AMMONIA" in the last 168 hours. Coagulation Profile: No results for input(s): "INR", "PROTIME" in the last 168 hours. Cardiac Enzymes: Recent Labs  Lab 03/13/23 0015  CKTOTAL 74   BNP (last 3 results) No results for input(s): "PROBNP" in the last 8760 hours. HbA1C: No results for input(s): "HGBA1C" in the  last 72 hours.  CBG: Recent Labs  Lab 03/17/23 1134 03/17/23 1644 03/17/23 2037 03/18/23 0741 03/18/23 1146  GLUCAP 108* 103* 97 98 121*   Lipid Profile: No results for input(s): "CHOL", "HDL", "LDLCALC", "TRIG", "CHOLHDL", "LDLDIRECT" in the last 72 hours. Thyroid Function Tests: No results for input(s): "TSH", "T4TOTAL", "FREET4", "T3FREE", "THYROIDAB" in the last 72 hours. Anemia Panel: No results for input(s): "VITAMINB12", "FOLATE", "FERRITIN", "TIBC", "IRON", "RETICCTPCT" in the last 72 hours. Sepsis Labs: Recent Labs  Lab 03/13/23 0015 03/13/23 0423  LATICACIDVEN 1.2 1.1    Recent Results (from the past 240 hour(s))  Culture, blood (Routine X 2) w Reflex to ID Panel     Status: None   Collection Time: 03/13/23 12:15 AM   Specimen: BLOOD  Result Value Ref Range Status   Specimen Description   Final    BLOOD BLOOD LEFT HAND Performed at [facility], [address], [place], [place] [address]    Special Requests   Final    BOTTLES DRAWN AEROBIC AND ANAEROBIC Blood Culture adequate volume Performed at [facility], [address] [place], [place] [address]    Culture   Final    NO GROWTH 5 DAYS Performed at [facility], [address], [place], [place] [address]    Report Status 03/18/2023 FINAL  Final  Culture, blood (Routine X 2) w Reflex to ID Panel     Status: None   Collection Time: 03/13/23 12:22 AM   Specimen: BLOOD  Result Value Ref Range Status   Specimen Description   Final    BLOOD BLOOD RIGHT HAND Performed at [facility], [address], [place], [place] [address]    Special Requests   Final    BOTTLES DRAWN AEROBIC AND ANAEROBIC Blood Culture adequate volume Performed at [facility], [address] [place], [place] [address]    Culture   Final    NO GROWTH 5 DAYS Performed at [facility], [address], [place], [place] [address]    Report Status  03/18/2023 FINAL  Final  MRSA Next Gen by PCR, Nasal     Status: None   Collection Time: 03/14/23 10:46 AM   Specimen: Nasal Mucosa; Nasal Swab  Result Value Ref Range Status   MRSA by PCR Next Gen NOT DETECTED NOT DETECTED Final    Comment: (NOTE) The GeneXpert MRSA Assay (FDA approved for NASAL specimens only), is one component of a comprehensive MRSA colonization surveillance program. It is not intended to diagnose MRSA infection nor to guide or monitor treatment for MRSA infections. Test performance is not FDA approved in patients less than 2  years old. Performed at [facility], [address], [place], [place] [address]          Radiology Studies: CT ABDOMEN PELVIS W CONTRAST  Result Date: 03/18/2023 CLINICAL DATA:  Peritonitis EXAM: CT ABDOMEN AND PELVIS WITH CONTRAST TECHNIQUE: Multidetector CT imaging of the abdomen and pelvis was performed using the standard protocol following bolus administration of intravenous contrast. RADIATION DOSE REDUCTION: This exam was performed according to the departmental dose-optimization program which includes automated exposure control, adjustment of the mA and/or kV according to patient size and/or use of iterative reconstruction technique. CONTRAST:  OMNIPAQUE IOHEXOL 300 MG/ML  SOLN COMPARISON:  None Available. FINDINGS: Lower chest: No acute abnormality. Hepatobiliary: Numerous residual calcified gallstones are seen within the gallbladder fossa. When compared to prior examination, the gallbladder has been evacuated of its fluid component and a a surgical drainage catheter is seen immediately adjacent to the decompressed gallbladder fundus. The liver is unremarkable. No intra or extrahepatic biliary ductal dilation. No loculated perihepatic or intra-abdominal fluid collections are identified. No free perihepatic fluid. Pancreas: Unremarkable Spleen: Unremarkable Adrenals/Urinary Tract: The adrenal glands are unremarkable.  The kidneys are normal in size and position. Scattered bilateral 1-2 mm punctate nonobstructing calculi are identified. The kidneys are otherwise unremarkable. The bladder is decompressed. There is, however, circumferential bladder wall thickening suggesting changes of chronic bladder outlet obstruction. Stomach/Bowel: Stomach is within normal limits. Appendix appears normal. No evidence of bowel wall thickening, distention, or inflammatory changes. No free intraperitoneal gas or fluid. Vascular/Lymphatic: Aortic atherosclerosis. No enlarged abdominal or pelvic lymph nodes. Reproductive: Marked prostatic hypertrophy. Other: No abdominal wall hernia. Musculoskeletal: Right unilateral L5 pars defect. Osseous structures are age-appropriate. No acute bone abnormality. IMPRESSION: 1. Interval evacuation of the gallbladder of its fluid component with a surgical drainage catheter seen immediately adjacent to the decompressed gallbladder fundus. Numerous residual calcified gallstones are seen within the gallbladder fossa. No loculated perihepatic or intra-abdominal fluid collections are identified. No free perihepatic fluid. 2. Bilateral nonobstructing nephrolithiasis. 3. Marked prostatic hypertrophy. 4. Circumferential bladder wall thickening suggesting changes of chronic bladder outlet obstruction. The bladder, however, is decompressed. Aortic Atherosclerosis (ICD10-I70.0). Electronically Signed   By: Helyn Numbers M.D.   On: 03/18/2023 04:10        Scheduled Meds:  [MAR Hold] acetaminophen  1,000 mg Oral TID   [MAR Hold] busPIRone  5 mg Oral TID   [MAR Hold] Carbidopa-Levodopa ER  1 tablet Oral Daily   [MAR Hold] docusate sodium  100 mg Oral BID   [MAR Hold] insulin aspart  0-9 Units Subcutaneous TID AC & HS   [MAR Hold] lidocaine  1 patch Transdermal Q24H   [MAR Hold] losartan  50 mg Oral Daily   [MAR Hold] polyethylene glycol  17 g Oral BID   [MAR Hold] rOPINIRole  2 mg Oral TID   [MAR Hold] tamsulosin   0.8 mg Oral Daily   Continuous Infusions:  [MAR Hold] sodium chloride 10 mL/hr at 03/12/23 1250   sodium chloride     lactated ringers 10 mL/hr at 03/18/23 1229   [MAR Hold] piperacillin-tazobactam (ZOSYN)  IV 3.375 g (03/18/23 1210)     LOS: 6 days    Time spent: 53 minutes spent on chart review, discussion with nursing staff, consultants, updating family and interview/physical exam; more than 50% of that time was spent in counseling and/or coordination of care.    Alvira Philips [place], DO [facility] Available via Epic secure chat 7am-7pm After these hours,  please refer to coverage provider listed on [email or web address] 03/18/2023, 12:55 PM

## 2023-03-18 NOTE — Anesthesia Procedure Notes (Signed)
Procedure Name: Intubation Date/Time: 03/18/2023 1:30 PM  Performed by: Vanessa [place], CRNAPre-anesthesia Checklist: Emergency Drugs available, Suction available, Patient identified and Patient being monitored Patient Re-evaluated:Patient Re-evaluated prior to induction Oxygen Delivery Method: Circle system utilized Preoxygenation: Pre-oxygenation with 100% oxygen Induction Type: IV induction Ventilation: Oral airway inserted - appropriate to patient size, Two handed mask ventilation required and Mask ventilation with difficulty Laryngoscope Size: Glidescope and 4 Grade View: Grade I Tube type: Oral Tube size: 7.5 mm Number of attempts: 1 Airway Equipment and Method: Video-laryngoscopy Placement Confirmation: ETT inserted through vocal cords under direct vision, positive ETCO2 and breath sounds checked- equal and bilateral Secured at: 22 cm Tube secured with: Tape Dental Injury: Teeth and Oropharynx as per pre-operative assessment  Future Recommendations: Recommend- induction with short-acting agent, and alternative techniques readily available

## 2023-03-18 NOTE — Op Note (Signed)
[facility] Patient Name: Jonathan Yu Procedure Date: 03/18/2023 MRN: [id number] Attending MD: Vida Rigger , MD, [id number] Date of Birth: 1951/01/02 CSN: [id number] Age: 72 Admit Type: Inpatient Procedure:                ERCP Indications:              Treatment of bile leak Providers:                Vida Rigger, MD Referring MD:              Medicines:                General Anesthesia Complications:            No immediate complications. Estimated Blood Loss:     Estimated blood loss: none. Procedure:                Pre-Anesthesia Assessment:                           - Prior to the procedure, a History and Physical                            was performed, and patient medications and                            allergies were reviewed. The patient's tolerance of                            previous anesthesia was also reviewed. The risks                            and benefits of the procedure and the sedation                            options and risks were discussed with the patient.                            All questions were answered, and informed consent                            was obtained. Prior Anticoagulants: The patient has                            taken no anticoagulant or antiplatelet agents. ASA                            Grade Assessment: III - A patient with severe                            systemic disease. After reviewing the risks and                            benefits, the patient was deemed in satisfactory  condition to undergo the procedure.                           After obtaining informed consent, the scope was                            passed under direct vision. Throughout the                            procedure, the patient's blood pressure, pulse, and                            oxygen saturations were monitored continuously. The                            [id number]-Q190V ([id number] Olympus duodenoscope was                             introduced through the mouth, and used to inject                            contrast into and used to cannulate the bile duct.                            The ERCP was accomplished without difficulty. The                            patient tolerated the procedure well. Scope In: Scope Out: Findings:      The scope was passed through the upper GI tract under direct vision. One       non-bleeding superficial duodenal ulcer with no stigmata of bleeding was       found in the duodenal bulb. The major papilla was normal. Deep selective       cannulation was readily obtained and an obvious bile leak but no other       filling defects were seen on initial cholangiogram and we proceeded with       a small biliary sphincterotomy was made with a Hydratome sphincterotome       using ERBE electrocautery. We could get the half both sphincterotome       easily in and out of the duct and there was no post-sphincterotomy       bleeding. To discover objects, the biliary tree was swept with a 12 mm       balloon starting at the bifurcation. Minimal sludge was swept from the       duct. Nothing else was found and the balloon passed readily through the       patent sphincterotomy site. One 10 Fr by 7 cm transpapillary temporary       plastic biliary stent with a single external flap and a single internal       flap was placed 5.5 cm into the common bile duct. Bile flowed through       the stent. The stent was in good position. The wire introducer and scope       were then removed and the patient tolerated the procedure well there was  no obvious immediate complication and there was no pancreatic duct       injection or wire advancement throughout the procedure Impression:               - Non-bleeding duodenal ulcer with no stigmata of                            bleeding.                           - The major papilla appeared normal.                           - A biliary  sphincterotomy was performed.                           - The biliary tree was swept and minimal sludge and                            nothing else was found.                           - One temporary plastic biliary stent was placed                            into the common bile duct. Moderate Sedation:      Not Applicable - Patient had care per Anesthesia. Recommendation:           - Clear liquid diet today. If doing well tomorrow                            may have soft solids                           - Continue present medications. 1 pantoprazole a day                           - Return to GI clinic in 4 weeks.                           - Telephone GI clinic if symptomatic PRN.                           - Return to endoscopist for stent removal in 6                            weeks. Procedure Code(s):        --- Professional ---                           [phone], Endoscopic retrograde                            cholangiopancreatography (ERCP); with placement of  endoscopic stent into biliary or pancreatic duct,                            including pre- and post-dilation and guide wire                            passage, when performed, including sphincterotomy,                            when performed, each stent                           [id number], Endoscopic retrograde                            cholangiopancreatography (ERCP); with removal of                            calculi/debris from biliary/pancreatic duct(s) Diagnosis Code(s):        --- Professional ---                           K26.9, Duodenal ulcer, unspecified as acute or                            chronic, without hemorrhage or perforation                           K83.8, Other specified diseases of biliary tract CPT copyright 2022 American Medical Association. All rights reserved. The codes documented in this report are preliminary and upon coder review may  be revised to meet current  compliance requirements. Vida Rigger, MD 03/18/2023 2:14:35 PM This report has been signed electronically. Number of Addenda: 0

## 2023-03-18 NOTE — Progress Notes (Signed)
[facility] Gastroenterology Progress Note  Jonathan Yu 72 y.o. [birth year]  CC: Possible bile leak   Subjective: Patient seen and examined at bedside.  Continues to have abdominal pain.  Continues to have high output in JP drain.  ROS : Afebrile, negative for chest pain.   Objective: Vital signs in last 24 hours: Vitals:   03/18/23 0525 03/18/23 0758  BP: (!) 184/91 (!) 163/103  Pulse: (!) 51 64  Resp: 16   Temp: (!) 97.5 F (36.4 C)   SpO2: 97%     Physical Exam:  General:  Alert, cooperative, no distress, appears stated age  Head:  Normocephalic, without obvious abnormality, atraumatic  Eyes:  , EOM's intact,   Lungs:   Clear to auscultation bilaterally, respirations unlabored  Heart:  Regular rate and rhythm, S1, S2 normal  Abdomen:   Abdomen is moderately distended, generalized tenderness to palpation, bowel sound present, no peritoneal signs.  Extremities: Extremities normal, atraumatic, no  edema  Pulses: 2+ and symmetric    Lab Results: Recent Labs    03/17/23 0430 03/18/23 0430  NA 139 137  K 4.1 3.8  CL 107 102  CO2 25 25  GLUCOSE 115* 106*  BUN 15 10  CREATININE 0.85 0.72  CALCIUM 8.5* 8.7*   Recent Labs    03/17/23 0430 03/18/23 0430  AST 33 27  ALT 43 29  ALKPHOS 39 36*  BILITOT 1.4* 1.5*  PROT 6.3* 6.0*  ALBUMIN 3.0* 2.9*   Recent Labs    03/16/23 0541 03/18/23 0430  WBC 8.5 6.2  HGB 12.8* 13.0  HCT 39.5 40.5  MCV 90.4 91.2  PLT 244 210   No results for input(s): "LABPROT", "INR" in the last 72 hours.    Assessment/Plan: -Acute cholecystitis s/p laparoscopic subtotal cholecystectomy few days ago.  Now with increased JP drain in worsening central abdominal pain.  Right upper quadrant pain has improved.  Concern for bile leak -Parkinson's disease -Chronic constipation   Recommendations -------------------------- -CT scan negative for any acute changes.  It showed numerous residual calcified gallstone within the gallbladder  fossa.  No loculated fluid collection likely because of JP drain. -Tentative plan for ERCP today for possible bile leak as patient is having ongoing high output in JP drain.  Risks (post ERCP pancreatitis, bleeding, infection, bowel perforation that could require surgery, sedation-related changes in cardiopulmonary systems), benefits (identification and possible treatment of source of symptoms, exclusion of certain causes of symptoms), and alternatives (watchful waiting, radiographic imaging studies, empiric medical treatment)  were explained to patient/family in detail and patient wishes to proceed.   Kathi Der MD, FACP 03/18/2023, 10:36 AM  Contact #  [phone]

## 2023-03-18 NOTE — Progress Notes (Signed)
PT Cancellation Note  Patient Details Name: Quency Kitzinger MRN: [id number] DOB: 1950-10-14   Cancelled Treatment:    Reason Eval/Treat Not Completed: Other (comment) Pt ambulated earlier today with mobility specialist and then had ERCP.  Reports fatigue and upper abdominal pain that feels like reflux.  Declined PT.  Will f/u later date.  Anise Salvo, PT Acute Rehab [facility] [phone]   Rayetta Humphrey 03/18/2023, 4:59 PM

## 2023-03-18 NOTE — Plan of Care (Addendum)
  Problem: Health Behavior/Discharge Planning: Goal: Ability to manage health-related needs will improve Outcome: Progressing   Problem: Clinical Measurements: Goal: Ability to maintain clinical measurements within normal limits will improve Outcome: Progressing Goal: Will remain free from infection Outcome: Progressing Goal: Diagnostic test results will improve Outcome: Progressing   Problem: Activity: Goal: Risk for activity intolerance will decrease Outcome: Progressing   Problem: Nutrition: Goal: Adequate nutrition will be maintained Outcome: Progressing   Problem: Coping: Goal: Level of anxiety will decrease Outcome: Progressing

## 2023-03-18 NOTE — Progress Notes (Signed)
Mobility Specialist - Progress Note   03/18/23 0951  Mobility  Activity Ambulated with assistance in hallway  Level of Assistance Standby assist, set-up cues, supervision of patient - no hands on  Assistive Device Front wheel walker  Distance Ambulated (ft) 500 ft  Range of Motion/Exercises Active  Activity Response Tolerated well  Mobility Referral Yes  $Mobility charge 1 Mobility  Mobility Specialist Start Time (ACUTE ONLY) 0933  Mobility Specialist Stop Time (ACUTE ONLY) 0951  Mobility Specialist Time Calculation (min) (ACUTE ONLY) 18 min   Pt was found in bed and agreeable to ambulate. Had x1 standing rest break during session and stated feeling a little SOB. At EOS returned to bed with all needs met. Call bell in reach and RN in room.  Billey Chang Mobility Specialist

## 2023-03-18 NOTE — Transfer of Care (Signed)
Immediate Anesthesia Transfer of Care Note  Patient: Karan Bosque  Procedure(s) Performed: Procedure(s): ENDOSCOPIC RETROGRADE CHOLANGIOPANCREATOGRAPHY (ERCP) (N/A) SPHINCTEROTOMY BILIARY STENT PLACEMENT (N/A)  Patient Location: PACU  Anesthesia Type:MAC  Level of Consciousness: Patient easily awoken, sedated, comfortable, cooperative, following commands, responds to stimulation.   Airway & Oxygen Therapy: Patient spontaneously breathing, ventilating well, oxygen via simple oxygen mask.  Post-op Assessment: Report given to PACU RN, vital signs reviewed and stable, moving all extremities.   Post vital signs: Reviewed and stable.  Complications: No apparent anesthesia complications  Last Vitals:  Vitals Value Taken Time  BP 120/64 03/18/23 1431  Temp 36.4 C 03/18/23 1430  Pulse 83 03/18/23 1436  Resp 15 03/18/23 1436  SpO2 100 % 03/18/23 1436  Vitals shown include unfiled device data.  Last Pain:  Vitals:   03/18/23 1430  TempSrc: Axillary  PainSc:       Patients Stated Pain Goal: 2 (03/18/23 0758)  Complications: No notable events documented.

## 2023-03-18 NOTE — Progress Notes (Signed)
4 Days Post-Op  Subjective: CC: Feeling better today. Had some bloating/distension yesterday afternoon and had a CT scan. Reviewed findings with him. Having some pain around his incisions and RUQ currently. He is currently NPO. No n/v. Passing flatus. No BM. Voiding. Reports he is mobilizing with staff.   Afebrile. No tachycardia or hypotension. WBC wnl. T. Bili 1.3 > 1.4 > 1.5. AST/ALT/Alk Phos non-elevated. JP drain bilious - output up at 280cc/24 hours.   Objective: Vital signs in last 24 hours: Temp:  [97.5 F (36.4 C)-98.5 F (36.9 C)] 97.5 F (36.4 C) (08/06 0525) Pulse Rate:  [51-73] 64 (08/06 0758) Resp:  [16-18] 16 (08/06 0525) BP: (112-184)/(90-103) 163/103 (08/06 0758) SpO2:  [95 %-98 %] 97 % (08/06 0525) FiO2 (%):  [21 %] 21 % (08/05 2031) Last BM Date : 03/14/23  Intake/Output from previous day: 08/05 0701 - 08/06 0700 In: 1960.2 [P.O.:1000; I.V.:518.7; IV Piggyback:441.5] Out: 1530 [Urine:1250; Drains:280] Intake/Output this shift: Total I/O In: 0  Out: 500 [Urine:500]  PE: Gen:  Alert, NAD, pleasant Card:  Reg Pulm:  Rate and effort normal Abd: Soft, mild distension, appropriately tender around laparoscopic incisions, no rigidity or guarding and otherwise NT, +BS. Incisions with glue intact appears well and are without drainage, bleeding, or signs of infection. JP drain bilious  Psych: A&Ox3   Lab Results:  Recent Labs    03/16/23 0541 03/18/23 0430  WBC 8.5 6.2  HGB 12.8* 13.0  HCT 39.5 40.5  PLT 244 210   BMET Recent Labs    03/17/23 0430 03/18/23 0430  NA 139 137  K 4.1 3.8  CL 107 102  CO2 25 25  GLUCOSE 115* 106*  BUN 15 10  CREATININE 0.85 0.72  CALCIUM 8.5* 8.7*   PT/INR No results for input(s): "LABPROT", "INR" in the last 72 hours. CMP     Component Value Date/Time   NA 137 03/18/2023 0430   K 3.8 03/18/2023 0430   CL 102 03/18/2023 0430   CO2 25 03/18/2023 0430   GLUCOSE 106 (H) 03/18/2023 0430   BUN 10 03/18/2023  0430   CREATININE 0.72 03/18/2023 0430   CREATININE 0.88 07/18/2022 1333   CALCIUM 8.7 (L) 03/18/2023 0430   PROT 6.0 (L) 03/18/2023 0430   ALBUMIN 2.9 (L) 03/18/2023 0430   AST 27 03/18/2023 0430   ALT 29 03/18/2023 0430   ALKPHOS 36 (L) 03/18/2023 0430   BILITOT 1.5 (H) 03/18/2023 0430   GFRNONAA >60 03/18/2023 0430   GFRNONAA 75 03/16/2020 1038   GFRAA 87 03/16/2020 1038   Lipase     Component Value Date/Time   LIPASE 46 03/12/2023 0748    Studies/Results: CT ABDOMEN PELVIS W CONTRAST  Result Date: 03/18/2023 CLINICAL DATA:  Peritonitis EXAM: CT ABDOMEN AND PELVIS WITH CONTRAST TECHNIQUE: Multidetector CT imaging of the abdomen and pelvis was performed using the standard protocol following bolus administration of intravenous contrast. RADIATION DOSE REDUCTION: This exam was performed according to the departmental dose-optimization program which includes automated exposure control, adjustment of the mA and/or kV according to patient size and/or use of iterative reconstruction technique. CONTRAST:  OMNIPAQUE IOHEXOL 300 MG/ML  SOLN COMPARISON:  None Available. FINDINGS: Lower chest: No acute abnormality. Hepatobiliary: Numerous residual calcified gallstones are seen within the gallbladder fossa. When compared to prior examination, the gallbladder has been evacuated of its fluid component and a a surgical drainage catheter is seen immediately adjacent to the decompressed gallbladder fundus. The liver is unremarkable. No intra  or extrahepatic biliary ductal dilation. No loculated perihepatic or intra-abdominal fluid collections are identified. No free perihepatic fluid. Pancreas: Unremarkable Spleen: Unremarkable Adrenals/Urinary Tract: The adrenal glands are unremarkable. The kidneys are normal in size and position. Scattered bilateral 1-2 mm punctate nonobstructing calculi are identified. The kidneys are otherwise unremarkable. The bladder is decompressed. There is, however,  circumferential bladder wall thickening suggesting changes of chronic bladder outlet obstruction. Stomach/Bowel: Stomach is within normal limits. Appendix appears normal. No evidence of bowel wall thickening, distention, or inflammatory changes. No free intraperitoneal gas or fluid. Vascular/Lymphatic: Aortic atherosclerosis. No enlarged abdominal or pelvic lymph nodes. Reproductive: Marked prostatic hypertrophy. Other: No abdominal wall hernia. Musculoskeletal: Right unilateral L5 pars defect. Osseous structures are age-appropriate. No acute bone abnormality. IMPRESSION: 1. Interval evacuation of the gallbladder of its fluid component with a surgical drainage catheter seen immediately adjacent to the decompressed gallbladder fundus. Numerous residual calcified gallstones are seen within the gallbladder fossa. No loculated perihepatic or intra-abdominal fluid collections are identified. No free perihepatic fluid. 2. Bilateral nonobstructing nephrolithiasis. 3. Marked prostatic hypertrophy. 4. Circumferential bladder wall thickening suggesting changes of chronic bladder outlet obstruction. The bladder, however, is decompressed. Aortic Atherosclerosis (ICD10-I70.0). Electronically Signed   By: Helyn Numbers M.D.   On: 03/18/2023 04:10    Anti-infectives: Anti-infectives (From admission, onward)    Start     Dose/Rate Route Frequency Ordered Stop   03/12/23 2000  piperacillin-tazobactam (ZOSYN) IVPB 3.375 g        3.375 g 12.5 mL/hr over 240 Minutes Intravenous Every 8 hours 03/12/23 1230     03/12/23 1245  piperacillin-tazobactam (ZOSYN) IVPB 3.375 g        3.375 g 100 mL/hr over 30 Minutes Intravenous  Once 03/12/23 1230 03/12/23 1325        Assessment/Plan POD 4 s/p Laparoscopic subtotal cholecystectomy for acute on chronic gangrenous cholecystitis by Dr. Cliffton Asters on 03/14/23 - Cont abx 5d post op. Afebrile. WBC wnl. CT w/ no loculated perihepatic or intra-abdominal fluid collections  - JP drain  bilious. GI following for possible ERCP - CT w/ residual calcified gallstones. Discussed with attending. No plans for further surgery inpatient. Can discuss if he needs an additional surgery as outpatient during his f/u appointment.  - Mobilize - therapies  - Pulm toilet   FEN - NPO for GI recs. If no ERCP planned today we are okay with starting liquids. IVF per TRH VTE - SCDs, okay for chem ppx from our standpoint ID - Zosyn   LOS: 6 days    Jacinto Halim , [facility] 03/18/2023, 8:58 AM Please see Amion for pager number during day hours 7:00am-4:30pm

## 2023-03-18 NOTE — Anesthesia Preprocedure Evaluation (Addendum)
Anesthesia Evaluation  Patient identified by MRN, date of birth, ID band Patient awake    Reviewed: Allergy & Precautions, NPO status , Patient's Chart, lab work & pertinent test results  Airway Mallampati: III  TM Distance: >3 FB Neck ROM: Full    Dental no notable dental hx. (+) Teeth Intact, Dental Advisory Given   Pulmonary sleep apnea and Continuous Positive Airway Pressure Ventilation , Current Smoker and Patient abstained from smoking.   Pulmonary exam normal breath sounds clear to auscultation       Cardiovascular hypertension, Pt. on medications Normal cardiovascular exam Rhythm:Regular Rate:Normal     Neuro/Psych  PSYCHIATRIC DISORDERS Anxiety     Parkinson's  Neuromuscular disease    GI/Hepatic Neg liver ROS,GERD  ,,  Endo/Other  diabetes, Type 2, Oral Hypoglycemic Agents  Morbid obesity (BMI 44)  Renal/GU negative Renal ROS  negative genitourinary   Musculoskeletal  (+) Arthritis ,    Abdominal   Peds  Hematology negative hematology ROS (+)   Anesthesia Other Findings   Reproductive/Obstetrics                             Anesthesia Physical Anesthesia Plan  ASA: 3  Anesthesia Plan: General   Post-op Pain Management:    Induction: Intravenous  PONV Risk Score and Plan: 1 and Dexamethasone, Ondansetron and Treatment may vary due to age or medical condition  Airway Management Planned: Oral ETT and Video Laryngoscope Planned  Additional Equipment:   Intra-op Plan:   Post-operative Plan: Extubation in OR  Informed Consent: I have reviewed the patients History and Physical, chart, labs and discussed the procedure including the risks, benefits and alternatives for the proposed anesthesia with the patient or authorized representative who has indicated his/her understanding and acceptance.     Dental advisory given  Plan Discussed with: CRNA  Anesthesia Plan Comments:         Anesthesia Quick Evaluation

## 2023-03-18 NOTE — Plan of Care (Signed)
  Problem: Education: Goal: Knowledge of General Education information will improve Description Including pain rating scale, medication(s)/side effects and non-pharmacologic comfort measures Outcome: Progressing   Problem: Health Behavior/Discharge Planning: Goal: Ability to manage health-related needs will improve Outcome: Progressing   Problem: Clinical Measurements: Goal: Ability to maintain clinical measurements within normal limits will improve Outcome: Progressing Goal: Will remain free from infection Outcome: Progressing Goal: Diagnostic test results will improve Outcome: Progressing   Problem: Activity: Goal: Risk for activity intolerance will decrease Outcome: Progressing   Problem: Nutrition: Goal: Adequate nutrition will be maintained Outcome: Progressing   Problem: Coping: Goal: Level of anxiety will decrease Outcome: Progressing   Problem: Elimination: Goal: Will not experience complications related to bowel motility Outcome: Progressing   Problem: Pain Managment: Goal: General experience of comfort will improve Outcome: Progressing

## 2023-03-18 NOTE — Progress Notes (Signed)
   03/18/23 2231  BiPAP/CPAP/SIPAP  BiPAP/CPAP/SIPAP Pt Type Adult  BiPAP/CPAP/SIPAP DREAMSTATIOND  Mask Type Nasal mask (MASK FROM HOME)  Respiratory Rate 16 breaths/min  FiO2 (%) 21 %  Patient Home Equipment No  Auto Titrate Yes (5-20CM H2O)  CPAP/SIPAP surface wiped down Yes  BiPAP/CPAP /SiPAP Vitals  Pulse Rate 71  Resp 16  SpO2 97 %

## 2023-03-18 NOTE — Progress Notes (Signed)
Dorion Capitano 1:04 PM  Subjective: Patient seen and examined and case discussed with my partner Dr. Levora Angel and the patient and he is actually doing okay today and we rediscussed the procedure and his hospital computer chart was reviewed  Objective: Vital signs stable afebrile no acute distress exam please see preassessment evaluation labs CT ultrasound and MRI reviewed  Assessment: Bile leak  Plan: Risk benefits methods and success rate of the ERCP was discussed with the patient will proceed today with anesthesia assistance with further workup and plans pending those findings  [facility]  office [phone] After 5PM or if no answer call [phone]

## 2023-03-18 NOTE — Anesthesia Postprocedure Evaluation (Signed)
Anesthesia Post Note  Patient: Jonathan Yu  Procedure(s) Performed: ENDOSCOPIC RETROGRADE CHOLANGIOPANCREATOGRAPHY (ERCP) SPHINCTEROTOMY BILIARY STENT PLACEMENT     Patient location during evaluation: PACU Anesthesia Type: General Level of consciousness: awake and alert Pain management: pain level controlled Vital Signs Assessment: post-procedure vital signs reviewed and stable Respiratory status: spontaneous breathing, nonlabored ventilation, respiratory function stable and patient connected to nasal cannula oxygen Cardiovascular status: blood pressure returned to baseline and stable Postop Assessment: no apparent nausea or vomiting Anesthetic complications: no  No notable events documented.  Last Vitals:  Vitals:   03/18/23 1500 03/18/23 1519  BP: (!) 161/88 (!) 171/72  Pulse: 72 80  Resp: 20 18  Temp:  36.5 C  SpO2: 93% 94%    Last Pain:  Vitals:   03/18/23 1519  TempSrc: Oral  PainSc:                  [name] [name]

## 2023-03-19 DIAGNOSIS — K810 Acute cholecystitis: Secondary | ICD-10-CM | POA: Diagnosis not present

## 2023-03-19 LAB — GLUCOSE, CAPILLARY
Glucose-Capillary: 108 mg/dL — ABNORMAL HIGH (ref 70–99)
Glucose-Capillary: 164 mg/dL — ABNORMAL HIGH (ref 70–99)
Glucose-Capillary: 173 mg/dL — ABNORMAL HIGH (ref 70–99)
Glucose-Capillary: 140 mg/dL — ABNORMAL HIGH (ref 70–99)

## 2023-03-19 LAB — LIPASE, BLOOD: Lipase: 24 U/L (ref 11–51)

## 2023-03-19 MED ORDER — ENOXAPARIN SODIUM 60 MG/0.6ML IJ SOSY
60.0000 mg | PREFILLED_SYRINGE | INTRAMUSCULAR | Status: DC
  Administered 2023-03-19 – 2023-03-23 (×5): 60 mg via SUBCUTANEOUS
  Filled 2023-03-19 (×6): qty 0.6

## 2023-03-19 MED ORDER — PIPERACILLIN-TAZOBACTAM 3.375 G IVPB
3.3750 g | Freq: Three times a day (TID) | INTRAVENOUS | Status: AC
  Administered 2023-03-19 – 2023-03-20 (×3): 3.375 g via INTRAVENOUS
  Filled 2023-03-19 (×3): qty 50

## 2023-03-19 MED ORDER — SIMETHICONE 80 MG PO CHEW
80.0000 mg | CHEWABLE_TABLET | Freq: Four times a day (QID) | ORAL | Status: DC | PRN
  Administered 2023-03-19 – 2023-03-20 (×2): 80 mg via ORAL
  Filled 2023-03-19 (×3): qty 1

## 2023-03-19 MED ORDER — FLEET ENEMA 7-19 GM/118ML RE ENEM: 1.0000 | ENEMA | Freq: Once | RECTAL | Status: DC

## 2023-03-19 NOTE — Progress Notes (Signed)
Occupational Therapy Treatment Patient Details Name: Jonathan Yu MRN: [id number] DOB: 13-Aug-1950 Today's Date: 03/19/2023   History of present illness Curtice Venkatesan is a 72 yr old male presented with abdominal pain;CTabd/pelvis - Findings highly concerning for acute cholecystitis.7/2 pt s/p laparoscopic subtotal cholecystectomy. Pt s/p ERCP and stent 8/6. PHM: HLD, HTN OSA, DM2 , parkinson disease, hypogonadism.   OT comments  Pt presented with moderate abdominal pain, subsequently requiring increased effort and time for tasks, such as supine to sit and sit to supine. OT educated him on adaptive strategies for performing lower body dressing, as well as equipment recommendations for lower body dressing and performing toileting hygiene. He reports chronic bilateral shoulder ROM limitations, due to torn rotator cuffs. He expresses an interest in home health therapy once discharged from the hospital, to help him regain his maximal functional independence. Continue OT plan of care.       If plan is discharge home, recommend the following:  A little help with walking and/or transfers;A lot of help with bathing/dressing/bathroom;Assistance with cooking/housework;Assist for transportation;Help with stairs or ramp for entrance   Equipment Recommendations  None recommended by OT    Recommendations for Other Services      Precautions / Restrictions Precautions Precautions: Fall Restrictions Weight Bearing Restrictions: No       Mobility Bed Mobility Overal bed mobility: Needs Assistance Bed Mobility: Supine to Sit, Sit to Supine     Supine to sit: Min assist, Used rails Sit to supine: Mod assist, Used rails (required assist for BLE management)        Transfers Overall transfer level: Needs assistance Equipment used: 1 person hand held assist Transfers: Sit to/from Stand Sit to Stand: Contact guard assist                     ADL either performed or assessed with clinical  judgement   ADL Overall ADL's : Needs assistance/impaired Eating/Feeding: Independent;Sitting Eating/Feeding Details (indicate cue type and reason): at chair level based on clinical judgement Grooming: Set up;Sitting Grooming Details (indicate cue type and reason): simulated       Lower Body Bathing Details (indicate cue type and reason): OT educated the pt on the option for use of a long handled sponge for lower body bathing tasks. Upper Body Dressing : Minimal assistance Upper Body Dressing Details (indicate cue type and reason): based on clinical judgement Lower Body Dressing: Moderate assistance Lower Body Dressing Details (indicate cue type and reason): The pt reported difficulty with performing lower body dressing tasks, given abdominal pain and chronic bilateral shoulder ROM limitations. As such, OT educated him using a reacher to donn bottoms and sock aid to donn socks. He typically wears slide in shoes for ease of donning.       Toileting - Clothing Manipulation Details (indicate cue type and reason): He reported difficulty with performing posterior peri-hygiene, given his chronic bilateral shoulder ROM limitations. As such, OT reviewed options for use of a toilet bidet, as well as toileting hygiene aide to assist in this regard. He reported having a toilet bidet at home.              Cognition Arousal: Alert Behavior During Therapy: WFL for tasks assessed/performed Overall Cognitive Status: Within Functional Limits for tasks assessed                      Pertinent Vitals/ Pain       Pain Assessment Pain Assessment: 0-10 Pain Score:  5  Pain Location: abdominal Pain Intervention(s): Limited activity within patient's tolerance, Monitored during session, Repositioned         Frequency  Min 1X/week        Progress Toward Goals  OT Goals(current goals can now be found in the care plan section)  Progress towards OT goals: Progressing toward goals  Acute  Rehab OT Goals OT Goal Formulation: With patient Time For Goal Achievement: 03/29/23 Potential to Achieve Goals: Good  Plan         AM-PAC OT "6 Clicks" Daily Activity     Outcome Measure   Help from another person eating meals?: None Help from another person taking care of personal grooming?: None Help from another person toileting, which includes using toliet, bedpan, or urinal?: A Little Help from another person bathing (including washing, rinsing, drying)?: A Lot Help from another person to put on and taking off regular upper body clothing?: A Little Help from another person to put on and taking off regular lower body clothing?: A Lot 6 Click Score: 18    End of Session Equipment Utilized During Treatment: Other (comment) (N/A)  OT Visit Diagnosis: Pain;Muscle weakness (generalized) (M62.81)   Activity Tolerance Patient limited by pain   Patient Left in bed;with call bell/phone within reach   Nurse Communication Mobility status        Time: 1354-1420 OT Time Calculation (min): 26 min  Charges: OT General Charges $OT Visit: 1 Visit OT Treatments $Self Care/Home Management : 8-22 mins $Therapeutic Activity: 8-22 mins     Reuben Likes, OTR/L 03/19/2023, 2:37 PM

## 2023-03-19 NOTE — Progress Notes (Signed)
PROGRESS NOTE    Jonathan Yu  OZH:[id number] DOB: 1950/10/25 DOA: 03/12/2023 PCP: Everrett Coombe, DO   Brief Narrative: 72 year old with past medical history significant for hypertension, hyperlipidemia, diabetes type 2, OSA, Parkinson disease, hypogonadism who presents to ED complaining of abdominal pain.  Consider evaluation he was found to be febrile temperature 101, lactic acid 1.2, white blood cell 7.6.  CT abdomen and pelvis with finding consistent with acute cholecystitis.  Ultrasound abdomen showed cholelithiasis with positive send no graphic sign.  Surgery was consulted, patient underwent MRCP that showed numerous small gallstones and sludge  within the gallbladder, including near the gallbladder neck and gallbladder wall thickening.  Finding consistent with acute cholecystitis.  No biliary ductal dilation or choledocholithiasis.  Patient underwent subtotal laparoscopic cholecystectomy on 03/14/2023 by Dr. Cliffton Asters with drain placement.  CT abdomen and pelvis 8/5 noted evacuation of the gallbladder with surgical drain noted, numerous calcified gallstone within the gallbladder fossa no loculated perihepatic or intra abdominal fluid collection. GI was consulted and patient underwent ERCP with stent placement for concern of bile leak on 80/6    Assessment & Plan:   Principal Problem:   Acute cholecystitis Active Problems:   Essential hypertension, benign   Dyslipidemia   Type 2 diabetes mellitus without complication, without long-term current use of insulin (HCC)   OSA on CPAP   BPH (benign prostatic hyperplasia)   Parkinsonism   1-Acute cholecystitis status post subtotal cholecystectomy Concern for bile leak -Patient presented with abdominal pain, ultrasound consistent with cholelithiasis and positive Murphy sign, MRCP with numerous  gallstone,  -Patient underwent subtotal cholecystectomy by Dr. Cliffton Asters on 8/2 .  CT abdomen and pelvis 8/5 noted evacuation of gallbladder with  surgical drain in place, numerous calcified gallstone within the gallbladder fossa, no loculated fluid collection -Continue with IV Zosyn -Pain management -Underwent ERCP and stent placement due to concern for bile leak 8/6 -Further management per general surgery  2-Elevated bilirubin secondary to acute cholecystitis Follows trend.   3-Essential Hypertension: Restarted losartan Holding furosemide  Hyperlipidemia: Holding atorvastatin due to transaminases  Diabetes Type II: Continue to hold home regimen metformin and glipizide Continue with sliding scale insulin  Parkinson's disease: Continue with Sinemet buspirone Requip  BPH -- Tamsulosin 0.8 mg p.o. daily   OSA -- Nocturnal CPAP   Morbid obesity Body mass index is 43.51 kg/m.  Discussed with patient needs for aggressive lifestyle changes/weight loss as this complicates all facets of care.  Outpatient follow-up with PCP.      Estimated body mass index is 43.51 kg/m as calculated from the following:   Height as of this encounter: 5\' 5"  (1.651 m).   Weight as of this encounter: 118.6 kg.   DVT prophylaxis: SCD, start lovenox Code Status: Full code Family Communication: Disposition Plan:  Status is: Inpatient Remains inpatient appropriate because: management of cholecystitis.     Consultants:  Surgery  GI  Procedures:  Cholecystectomy   Antimicrobials:    Subjective: He report some improvement of abdominal pain. Passing gas.    Objective: Vitals:   03/18/23 2124 03/18/23 2231 03/19/23 0559 03/19/23 1608  BP: (!) 143/82  (!) 161/99 (!) 120/59  Pulse: 76 71 78 72  Resp: 17 16 19 16   Temp: (!) 97.5 F (36.4 C)  97.8 F (36.6 C) (!) 97.5 F (36.4 C)  TempSrc: Oral  Oral Oral  SpO2: 92% 97% 93% 94%  Weight:      Height:        Intake/Output Summary (Last  24 hours) at 03/19/2023 1632 Last data filed at 03/19/2023 1400 Gross per 24 hour  Intake 1674.83 ml  Output 1085 ml  Net 589.83 ml   Filed  Weights   03/12/23 0738 03/14/23 1328 03/18/23 1225  Weight: 118.6 kg 118.6 kg 118.6 kg    Examination:  General exam: Appears calm and comfortable  Respiratory system: Clear to auscultation. Respiratory effort normal. Cardiovascular system: S1 & S2 heard, RRR. No JVD, murmurs, rubs, gallops or clicks. No pedal edema. Gastrointestinal system: Abdomen is nondistended, soft and nontender. Drain in placed.  Central nervous system: Alert and oriented. No focal neurological deficits. Extremities: no edema Data Reviewed: I have personally reviewed following labs and imaging studies  CBC: Recent Labs  Lab 03/14/23 0416 03/15/23 0529 03/16/23 0541 03/18/23 0430 03/19/23 0424  WBC 7.5 5.8 8.5 6.2 7.4  HGB 14.0 13.5 12.8* 13.0 14.7  HCT 42.4 41.2 39.5 40.5 44.6  MCV 91.0 89.2 90.4 91.2 91.4  PLT 165 197 244 210 246   Basic Metabolic Panel: Recent Labs  Lab 03/13/23 0022 03/14/23 0416 03/15/23 0529 03/16/23 0541 03/17/23 0430 03/18/23 0430 03/19/23 0424  NA 135 136 136 139 139 137 137  K 3.5 3.7 3.8 3.3* 4.1 3.8 4.5  CL 101 101 102 104 107 102 102  CO2 25 27 26 26 25 25 25   GLUCOSE 122* 100* 170* 139* 115* 106* 121*  BUN 12 12 14 19 15 10 13   CREATININE 0.92 0.76 0.85 0.83 0.85 0.72 0.89  CALCIUM 8.4* 8.4* 8.3* 8.9 8.5* 8.7* 9.2  MG 1.9 2.0  --   --   --   --   --   PHOS 3.7  --   --   --   --   --   --    GFR: Estimated Creatinine Clearance: 92.1 mL/min (by C-G formula based on SCr of 0.89 mg/dL). Liver Function Tests: Recent Labs  Lab 03/15/23 0529 03/16/23 0541 03/17/23 0430 03/18/23 0430 03/19/23 0424  AST 43* 36 33 27 27  ALT 54* 48* 43 29 35  ALKPHOS 43 42 39 36* 44  BILITOT 2.1* 1.3* 1.4* 1.5* 1.4*  PROT 6.4* 6.3* 6.3* 6.0* 6.7  ALBUMIN 2.9* 3.0* 3.0* 2.9* 3.1*   Recent Labs  Lab 03/19/23 0424  LIPASE 24   No results for input(s): "AMMONIA" in the last 168 hours. Coagulation Profile: No results for input(s): "INR", "PROTIME" in the last 168  hours. Cardiac Enzymes: Recent Labs  Lab 03/13/23 0015  CKTOTAL 74   BNP (last 3 results) No results for input(s): "PROBNP" in the last 8760 hours. HbA1C: No results for input(s): "HGBA1C" in the last 72 hours. CBG: Recent Labs  Lab 03/18/23 1707 03/18/23 2125 03/19/23 0738 03/19/23 1118 03/19/23 1610  GLUCAP 159* 296* 108* 164* 173*   Lipid Profile: No results for input(s): "CHOL", "HDL", "LDLCALC", "TRIG", "CHOLHDL", "LDLDIRECT" in the last 72 hours. Thyroid Function Tests: No results for input(s): "TSH", "T4TOTAL", "FREET4", "T3FREE", "THYROIDAB" in the last 72 hours. Anemia Panel: No results for input(s): "VITAMINB12", "FOLATE", "FERRITIN", "TIBC", "IRON", "RETICCTPCT" in the last 72 hours. Sepsis Labs: Recent Labs  Lab 03/13/23 0015 03/13/23 0423  LATICACIDVEN 1.2 1.1    Recent Results (from the past 240 hour(s))  Culture, blood (Routine X 2) w Reflex to ID Panel     Status: None   Collection Time: 03/13/23 12:15 AM   Specimen: BLOOD  Result Value Ref Range Status   Specimen Description   Final  BLOOD BLOOD LEFT HAND Performed at [facility], [address], [place], [place] [address]    Special Requests   Final    BOTTLES DRAWN AEROBIC AND ANAEROBIC Blood Culture adequate volume Performed at [facility], [address], [place], [place] [address]    Culture   Final    NO GROWTH 5 DAYS Performed at [facility], [address] [place], [place] [address]    Report Status 03/18/2023 FINAL  Final  Culture, blood (Routine X 2) w Reflex to ID Panel     Status: None   Collection Time: 03/13/23 12:22 AM   Specimen: BLOOD  Result Value Ref Range Status   Specimen Description   Final    BLOOD BLOOD RIGHT HAND Performed at [facility], [address] [place], [place] [address]    Special Requests   Final    BOTTLES DRAWN AEROBIC AND ANAEROBIC Blood Culture adequate volume Performed at  [facility], [address] [place], [place] [address]    Culture   Final    NO GROWTH 5 DAYS Performed at [facility], [address], [place], [place] [address]    Report Status 03/18/2023 FINAL  Final  MRSA Next Gen by PCR, Nasal     Status: None   Collection Time: 03/14/23 10:46 AM   Specimen: Nasal Mucosa; Nasal Swab  Result Value Ref Range Status   MRSA by PCR Next Gen NOT DETECTED NOT DETECTED Final    Comment: (NOTE) The GeneXpert MRSA Assay (FDA approved for NASAL specimens only), is one component of a comprehensive MRSA colonization surveillance program. It is not intended to diagnose MRSA infection nor to guide or monitor treatment for MRSA infections. Test performance is not FDA approved in patients less than 6 years old. Performed at [facility], [address], [place], [place] [address]          Radiology Studies: DG ERCP  Result Date: 03/18/2023 CLINICAL DATA:  [id number] Surgery, elective [id number] subtotal cholecystectomy EXAM: ERCP TECHNIQUE: Multiple spot images obtained with the fluoroscopic device and submitted for interpretation post-procedure. COMPARISON:  CT from previous day FINDINGS: A series of fluoroscopic spot images document endoscopic cannulation and opacification of the CBD. Calcified gallstones are evident, with some contrast extending to the region of the stones. IMPRESSION: Endoscopic CBD cannulation and intervention as above, suspected leak from cystic duct stump. Electronically Signed   By: Corlis Leak M.D.   On: 03/18/2023 18:32   CT ABDOMEN PELVIS W CONTRAST  Result Date: 03/18/2023 CLINICAL DATA:  Peritonitis EXAM: CT ABDOMEN AND PELVIS WITH CONTRAST TECHNIQUE: Multidetector CT imaging of the abdomen and pelvis was performed using the standard protocol following bolus administration of intravenous contrast. RADIATION DOSE REDUCTION: This exam was performed according to the departmental dose-optimization  program which includes automated exposure control, adjustment of the mA and/or kV according to patient size and/or use of iterative reconstruction technique. CONTRAST:  OMNIPAQUE IOHEXOL 300 MG/ML  SOLN COMPARISON:  None Available. FINDINGS: Lower chest: No acute abnormality. Hepatobiliary: Numerous residual calcified gallstones are seen within the gallbladder fossa. When compared to prior examination, the gallbladder has been evacuated of its fluid component and a a surgical drainage catheter is seen immediately adjacent to the decompressed gallbladder fundus. The liver is unremarkable. No intra or extrahepatic biliary ductal dilation. No loculated perihepatic or intra-abdominal fluid collections are identified. No free perihepatic fluid. Pancreas: Unremarkable Spleen: Unremarkable  Adrenals/Urinary Tract: The adrenal glands are unremarkable. The kidneys are normal in size and position. Scattered bilateral 1-2 mm punctate nonobstructing calculi are identified. The kidneys are otherwise unremarkable. The bladder is decompressed. There is, however, circumferential bladder wall thickening suggesting changes of chronic bladder outlet obstruction. Stomach/Bowel: Stomach is within normal limits. Appendix appears normal. No evidence of bowel wall thickening, distention, or inflammatory changes. No free intraperitoneal gas or fluid. Vascular/Lymphatic: Aortic atherosclerosis. No enlarged abdominal or pelvic lymph nodes. Reproductive: Marked prostatic hypertrophy. Other: No abdominal wall hernia. Musculoskeletal: Right unilateral L5 pars defect. Osseous structures are age-appropriate. No acute bone abnormality. IMPRESSION: 1. Interval evacuation of the gallbladder of its fluid component with a surgical drainage catheter seen immediately adjacent to the decompressed gallbladder fundus. Numerous residual calcified gallstones are seen within the gallbladder fossa. No loculated perihepatic or intra-abdominal fluid  collections are identified. No free perihepatic fluid. 2. Bilateral nonobstructing nephrolithiasis. 3. Marked prostatic hypertrophy. 4. Circumferential bladder wall thickening suggesting changes of chronic bladder outlet obstruction. The bladder, however, is decompressed. Aortic Atherosclerosis (ICD10-I70.0). Electronically Signed   By: Helyn Numbers M.D.   On: 03/18/2023 04:10        Scheduled Meds:  acetaminophen  1,000 mg Oral TID   busPIRone  5 mg Oral TID   Carbidopa-Levodopa ER  1 tablet Oral Daily   docusate sodium  100 mg Oral BID   insulin aspart  0-9 Units Subcutaneous TID AC & HS   lidocaine  1 patch Transdermal Q24H   losartan  50 mg Oral Daily   pantoprazole (PROTONIX) IV  40 mg Intravenous Q24H   polyethylene glycol  17 g Oral BID   rOPINIRole  2 mg Oral TID   tamsulosin  0.8 mg Oral Daily   Continuous Infusions:  sodium chloride 10 mL/hr at 03/18/23 1921   piperacillin-tazobactam (ZOSYN)  IV 3.375 g (03/19/23 1203)     LOS: 7 days    Time spent: 35 minutes    Shandon Burlingame A Daishaun Ayre, MD [facility]   If 7PM-7AM, please contact night-coverage [email or web address]  03/19/2023, 4:32 PM

## 2023-03-19 NOTE — Progress Notes (Signed)
Physical Therapy Treatment Patient Details Name: [name] MRN: [id number] DOB: 1951-03-31 Today's Date: 03/19/2023   History of Present Illness Jonathan Yu is a 72 y.o. male presented with abd pain;CTabd/pelvis - Findings highly concerning for acute cholecystitis.7/2 pt s/p laparoscopic subtotal cholecystectomy. Pt s/p ERCP and stent 8/6. PHMx: HLD, HTN OSA, DM2 , parkinson disease, hypogonadism.    PT Comments  Pt appears anxious, reporting pain "never life I've felt before", needing encouragement and relaxation techniques. Pt limited in all mobility due to pain, pt with audible belching upon rising that improves pain minimally and temporarily. Pt requesting to lean trunk on wall for standing rest breaks due to abdominal pain with ambulation and unable to tolerate sitting for longer than ~1 minute on EOB and BSC due to abdominal pain. Pt noted to be soiled in stool upon rising from bed, assisted pt in pericare due to pt unable to clean self. RN aware of pt complaints and aware of pain medication requests. Pt reports 6 children who are very supportive and can assist as needed.   If plan is discharge home, recommend the following: Assist for transportation;Help with stairs or ramp for entrance;A little help with walking and/or transfers;A little help with bathing/dressing/bathroom;Assistance with cooking/housework   Can travel by private vehicle        Equipment Recommendations  None recommended by PT    Recommendations for Other Services       Precautions / Restrictions Precautions Precautions: Fall Precaution Comments: Right JP drain Restrictions Weight Bearing Restrictions: No     Mobility  Bed Mobility Overal bed mobility: Needs Assistance Bed Mobility: Supine to Sit, Sit to Supine     Supine to sit: Mod assist, Used rails Sit to supine: Mod assist   General bed mobility comments: therapist assiting to mobilize BLE to EOB and mod A to upright trunk into sitting with  pt also using bedrail, theraipst using bedpad to assist pt in scooting out to EOB; mod A to lift BLE back into bed    Transfers Overall transfer level: Needs assistance Equipment used: 1 person hand held assist, Rolling walker (2 wheels) Transfers: Sit to/from Stand Sit to Stand: Min assist, Contact guard assist           General transfer comment: min A to CGA for multiple STS reps from EOB, initially pt requesting HHA, pt with significant posterior lean due ot abdominal pain complaints, completed following reps with RW and CGA for safety    Ambulation/Gait Ambulation/Gait assistance: Contact guard assist, Min assist Gait Distance (Feet): 60 Feet Assistive device: Rolling walker (2 wheels) Gait Pattern/deviations: Step-to pattern, Decreased stride length, Shuffle Gait velocity: decreased     General Gait Details: short, shuffling Parkinson-like gait pattern, heavy verbal cues for increasing step length and bil foot clearance, pt needing 3 standing rest breaks where he leaned against wall due to abdominal pain, min A to CGA without overt LOB   Stairs             Wheelchair Mobility     Tilt Bed    Modified Rankin (Stroke Patients Only)       Balance Overall balance assessment: Needs assistance Sitting-balance support: Bilateral upper extremity supported, Feet supported Sitting balance-Leahy Scale: Fair Sitting balance - Comments: seated EOB, posterior lean due to abdominal pain   Standing balance support: During functional activity, Reliant on assistive device for balance, Bilateral upper extremity supported Standing balance-Leahy Scale: Poor  Cognition Arousal: Alert Behavior During Therapy: WFL for tasks assessed/performed, Anxious Overall Cognitive Status: Within Functional Limits for tasks assessed                                 General Comments: pt appears anxious, reporting pain "never like I've  felt before"        Exercises      General Comments General comments (skin integrity, edema, etc.): Pt on RA wtih SpO2 97% and HR 116 noted      Pertinent Vitals/Pain Pain Assessment Pain Assessment: 0-10 Pain Score: 9  Pain Location: "lower right abdomen" Pain Descriptors / Indicators: Dull, Stabbing Pain Intervention(s): Limited activity within patient's tolerance, Monitored during session, Repositioned, Patient requesting pain meds-RN notified, Utilized relaxation techniques    Home Living                          Prior Function            PT Goals (current goals can now be found in the care plan section) Acute Rehab PT Goals PT Goal Formulation: With patient Time For Goal Achievement: 03/30/23 Potential to Achieve Goals: Good Progress towards PT goals: Progressing toward goals    Frequency    Min 1X/week      PT Plan      Co-evaluation              AM-PAC PT "6 Clicks" Mobility   Outcome Measure  Help needed turning from your back to your side while in a flat bed without using bedrails?: A Lot Help needed moving from lying on your back to sitting on the side of a flat bed without using bedrails?: A Lot Help needed moving to and from a bed to a chair (including a wheelchair)?: A Little Help needed standing up from a chair using your arms (e.g., wheelchair or bedside chair)?: A Little Help needed to walk in hospital room?: A Little Help needed climbing 3-5 steps with a railing? : A Lot 6 Click Score: 15    End of Session Equipment Utilized During Treatment: Gait belt Activity Tolerance: Patient limited by pain Patient left: in bed;with call bell/phone within reach Nurse Communication: Mobility status;Patient requests pain meds PT Visit Diagnosis: Other abnormalities of gait and mobility (R26.89);Difficulty in walking, not elsewhere classified (R26.2)     Time: 2956-2130 PT Time Calculation (min) (ACUTE ONLY): 44 min  Charges:     $Gait Training: 8-22 mins $Therapeutic Activity: 23-37 mins PT General Charges $$ ACUTE PT VISIT: 1 Visit                     Tori Jenniger Figiel PT, DPT 03/19/23, 10:42 AM

## 2023-03-19 NOTE — Progress Notes (Addendum)
1 Day Post-Op  Subjective: CC: S/p ERCP and stent yesterday.   Had some bloating/distension yesterday afternoon after ERCP and trying liquids but is better this morning. Lipase is wnl. Tolerating liquids today without n/v. Some pain in his upper abdomen and around his incisions. Passing flatus. BM yesterday. Voiding. Mobilizing.   Afebrile. No tachycardia or hypotension. WBC wnl. T. Bili 1.3 > 1.4 > 1.5 >1.4. AST/ALT/Alk Phos non-elevated. Drain output still bilious but downtrending.   Objective: Vital signs in last 24 hours: Temp:  [97.5 F (36.4 C)-98.2 F (36.8 C)] 97.8 F (36.6 C) (08/07 0559) Pulse Rate:  [71-81] 78 (08/07 0559) Resp:  [11-21] 19 (08/07 0559) BP: (120-171)/(64-112) 161/99 (08/07 0559) SpO2:  [92 %-100 %] 93 % (08/07 0559) FiO2 (%):  [21 %] 21 % (08/06 2231) Weight:  [118.6 kg] 118.6 kg (08/06 1225) Last BM Date : 03/18/23  Intake/Output from previous day: 08/06 0701 - 08/07 0700 In: 1817 [P.O.:960; I.V.:645; IV Piggyback:212] Out: 1585 [Urine:1500; Drains:85] Intake/Output this shift: No intake/output data recorded.  PE: Gen:  Alert, NAD, pleasant Card:  Reg Pulm:  Rate and effort normal Abd: Soft, mild distension, appropriately tender around laparoscopic incisions, no rigidity or guarding and otherwise NT, +BS. Incisions with glue intact appears well and are without drainage, bleeding, or signs of infection. JP drain bilious  Psych: A&Ox3   Lab Results:  Recent Labs    03/18/23 0430 03/19/23 0424  WBC 6.2 7.4  HGB 13.0 14.7  HCT 40.5 44.6  PLT 210 246   BMET Recent Labs    03/18/23 0430 03/19/23 0424  NA 137 137  K 3.8 4.5  CL 102 102  CO2 25 25  GLUCOSE 106* 121*  BUN 10 13  CREATININE 0.72 0.89  CALCIUM 8.7* 9.2   PT/INR No results for input(s): "LABPROT", "INR" in the last 72 hours. CMP     Component Value Date/Time   NA 137 03/19/2023 0424   K 4.5 03/19/2023 0424   CL 102 03/19/2023 0424   CO2 25 03/19/2023 0424    GLUCOSE 121 (H) 03/19/2023 0424   BUN 13 03/19/2023 0424   CREATININE 0.89 03/19/2023 0424   CREATININE 0.88 07/18/2022 1333   CALCIUM 9.2 03/19/2023 0424   PROT 6.7 03/19/2023 0424   ALBUMIN 3.1 (L) 03/19/2023 0424   AST 27 03/19/2023 0424   ALT 35 03/19/2023 0424   ALKPHOS 44 03/19/2023 0424   BILITOT 1.4 (H) 03/19/2023 0424   GFRNONAA >60 03/19/2023 0424   GFRNONAA 75 03/16/2020 1038   GFRAA 87 03/16/2020 1038   Lipase     Component Value Date/Time   LIPASE 24 03/19/2023 0424    Studies/Results: DG ERCP  Result Date: 03/18/2023 CLINICAL DATA:  [id number] Surgery, elective [id number] subtotal cholecystectomy EXAM: ERCP TECHNIQUE: Multiple spot images obtained with the fluoroscopic device and submitted for interpretation post-procedure. COMPARISON:  CT from previous day FINDINGS: A series of fluoroscopic spot images document endoscopic cannulation and opacification of the CBD. Calcified gallstones are evident, with some contrast extending to the region of the stones. IMPRESSION: Endoscopic CBD cannulation and intervention as above, suspected leak from cystic duct stump. Electronically Signed   By: Corlis Leak M.D.   On: 03/18/2023 18:32   CT ABDOMEN PELVIS W CONTRAST  Result Date: 03/18/2023 CLINICAL DATA:  Peritonitis EXAM: CT ABDOMEN AND PELVIS WITH CONTRAST TECHNIQUE: Multidetector CT imaging of the abdomen and pelvis was performed using the standard protocol following bolus administration of intravenous contrast. RADIATION  DOSE REDUCTION: This exam was performed according to the departmental dose-optimization program which includes automated exposure control, adjustment of the mA and/or kV according to patient size and/or use of iterative reconstruction technique. CONTRAST:  OMNIPAQUE IOHEXOL 300 MG/ML  SOLN COMPARISON:  None Available. FINDINGS: Lower chest: No acute abnormality. Hepatobiliary: Numerous residual calcified gallstones are seen within the gallbladder fossa. When compared to  prior examination, the gallbladder has been evacuated of its fluid component and a a surgical drainage catheter is seen immediately adjacent to the decompressed gallbladder fundus. The liver is unremarkable. No intra or extrahepatic biliary ductal dilation. No loculated perihepatic or intra-abdominal fluid collections are identified. No free perihepatic fluid. Pancreas: Unremarkable Spleen: Unremarkable Adrenals/Urinary Tract: The adrenal glands are unremarkable. The kidneys are normal in size and position. Scattered bilateral 1-2 mm punctate nonobstructing calculi are identified. The kidneys are otherwise unremarkable. The bladder is decompressed. There is, however, circumferential bladder wall thickening suggesting changes of chronic bladder outlet obstruction. Stomach/Bowel: Stomach is within normal limits. Appendix appears normal. No evidence of bowel wall thickening, distention, or inflammatory changes. No free intraperitoneal gas or fluid. Vascular/Lymphatic: Aortic atherosclerosis. No enlarged abdominal or pelvic lymph nodes. Reproductive: Marked prostatic hypertrophy. Other: No abdominal wall hernia. Musculoskeletal: Right unilateral L5 pars defect. Osseous structures are age-appropriate. No acute bone abnormality. IMPRESSION: 1. Interval evacuation of the gallbladder of its fluid component with a surgical drainage catheter seen immediately adjacent to the decompressed gallbladder fundus. Numerous residual calcified gallstones are seen within the gallbladder fossa. No loculated perihepatic or intra-abdominal fluid collections are identified. No free perihepatic fluid. 2. Bilateral nonobstructing nephrolithiasis. 3. Marked prostatic hypertrophy. 4. Circumferential bladder wall thickening suggesting changes of chronic bladder outlet obstruction. The bladder, however, is decompressed. Aortic Atherosclerosis (ICD10-I70.0). Electronically Signed   By: Helyn Numbers M.D.   On: 03/18/2023 04:10     Anti-infectives: Anti-infectives (From admission, onward)    Start     Dose/Rate Route Frequency Ordered Stop   03/12/23 2000  piperacillin-tazobactam (ZOSYN) IVPB 3.375 g        3.375 g 12.5 mL/hr over 240 Minutes Intravenous Every 8 hours 03/12/23 1230     03/12/23 1245  piperacillin-tazobactam (ZOSYN) IVPB 3.375 g        3.375 g 100 mL/hr over 30 Minutes Intravenous  Once 03/12/23 1230 03/12/23 1325        Assessment/Plan POD 5 s/p Laparoscopic subtotal cholecystectomy for acute on chronic gangrenous cholecystitis by Dr. Cliffton Asters on 03/14/23 - Cont abx through today. Afebrile. WBC wnl. CT w/ no loculated perihepatic or intra-abdominal fluid collections  - S/p ERCP and CBD stenting on 8/6. Drain output still bilious but downtrending. Monitor to ensure this continues to improve and clears up.  - CT w/ residual calcified gallstones. Discussed with attending. No plans for further surgery inpatient. Can discuss if he needs an additional surgery as outpatient during his f/u appointment.  - Adv diet - Mobilize - therapies  - Pulm toilet   FEN - FLD, ADAT, increase bowel regimen. IVF per TRH VTE - SCDs, okay for chem ppx from our standpoint ID - Zosyn   LOS: 7 days    Jacinto Halim , [facility] 03/19/2023, 8:17 AM Please see Amion for pager number during day hours 7:00am-4:30pm

## 2023-03-19 NOTE — Progress Notes (Signed)
   03/19/23 2312  BiPAP/CPAP/SIPAP  BiPAP/CPAP/SIPAP Pt Type Adult  BiPAP/CPAP/SIPAP DREAMSTATIOND  Mask Type Full face mask (home mask)  Mask Size Medium  FiO2 (%) 21 %  Patient Home Equipment No  Auto Titrate Yes (5-20)

## 2023-03-19 NOTE — Progress Notes (Signed)
[facility] Progress Note  Jonathan Yu 72 y.o. 29-Sep-1950  CC: Possible bile leak   Subjective: Patient seen and examined at bedside.  Feeling somewhat better today.  Continues to have abdominal pain.  JP drain output has improved.  Complaining of abdominal distention and constipation.  ROS : Afebrile, negative for chest pain.   Objective: Vital signs in last 24 hours: Vitals:   03/18/23 2231 03/19/23 0559  BP:  (!) 161/99  Pulse: 71 78  Resp: 16 19  Temp:  97.8 F (36.6 C)  SpO2: 97% 93%    Physical Exam:  General:  Alert, cooperative, no distress, appears stated age  Head:  Normocephalic, without obvious abnormality, atraumatic  Eyes:  , EOM's intact,   Lungs:   Clear to auscultation bilaterally, respirations unlabored  Heart:  Regular rate and rhythm, S1, S2 normal  Abdomen:   Abdomen is moderately distended, generalized tenderness to palpation, bowel sound present, no peritoneal signs.  Extremities: Extremities normal, atraumatic, no  edema  Pulses: 2+ and symmetric    Lab Results: Recent Labs    03/18/23 0430 03/19/23 0424  NA 137 137  K 3.8 4.5  CL 102 102  CO2 25 25  GLUCOSE 106* 121*  BUN 10 13  CREATININE 0.72 0.89  CALCIUM 8.7* 9.2   Recent Labs    03/18/23 0430 03/19/23 0424  AST 27 27  ALT 29 35  ALKPHOS 36* 44  BILITOT 1.5* 1.4*  PROT 6.0* 6.7  ALBUMIN 2.9* 3.1*   Recent Labs    03/18/23 0430 03/19/23 0424  WBC 6.2 7.4  HGB 13.0 14.7  HCT 40.5 44.6  MCV 91.2 91.4  PLT 210 246   No results for input(s): "LABPROT", "INR" in the last 72 hours.    Assessment/Plan: -Acute cholecystitis s/p laparoscopic subtotal cholecystectomy few days ago.  Now with increased JP drain in worsening central abdominal pain.  Right upper quadrant pain has improved.  Concern for bile leak -Parkinson's disease -Chronic constipation   Recommendations -------------------------- -CT scan negative for any acute changes.  It showed numerous  residual calcified gallstone within the gallbladder fossa.  No loculated fluid collection likely because of JP drain. -Status post ERCP with sphincterotomy and plastic stent placement 08/06 with Dr. Ewing Schlein.  JP drain improving. -Continue MiraLAX twice a day for constipation, add fleets enema as needed. -Commend follow-up with surgical team to discuss residual gallstone and possible another gallbladder surgery as an outpatient. -No further inpatient GI workup planned.  GI will sign off.  Call us back if needed.  Kathi Der MD, FACP 03/19/2023, 10:41 AM  Contact #  [phone]

## 2023-03-19 NOTE — TOC Initial Note (Signed)
Transition of Care [facility]) - Initial/Assessment Note    Patient Details  Name: Jonathan Yu MRN: [id number] Date of Birth: March 26, 1951  Transition of Care [facility]) CM/SW Contact:    Amada Jupiter, LCSW Phone Number: 03/19/2023, 2:21 PM  Clinical Narrative:                 Met with pt today to review potential dc planning needs.  Pt reports that his two adult sons live with him but both working.  Daughter currently on school break and, also, in the home and working nights.  He notes they are supportive but there will be periods of time that he is alone in the home.   Discussed recommendation for HHPT per therapy and he is agreeable but requests this be arranged with an agency he has used in the past:  [facility] @ [phone].  Have confirmed with agency they do accept [facility]. Will await HH orders.  Pt reports he has RW and other DME in the home already.  TOC will continue to follow along.  Expected Discharge Plan: Home w Home Health Services Barriers to Discharge: Continued Medical Work up   Patient Goals and CMS Choice Patient states their goals for this hospitalization and ongoing recovery are:: return home          Expected Discharge Plan and Services       Living arrangements for the past 2 months: Single Family Home                 DME Arranged: N/A DME Agency: NA                  Prior Living Arrangements/Services Living arrangements for the past 2 months: Single Family Home Lives with:: Adult Children Patient language and need for interpreter reviewed:: Yes Do you feel safe going back to the place where you live?: Yes      Need for Family Participation in Patient Care: Yes (Comment) Care giver support system in place?: Yes (comment)   Criminal Activity/Legal Involvement Pertinent to Current Situation/Hospitalization: No - Comment as needed  Activities of Daily Living Home Assistive Devices/Equipment: None ADL Screening (condition at time of  admission) Patient's cognitive ability adequate to safely complete daily activities?: Yes Is the patient deaf or have difficulty hearing?: No Does the patient have difficulty seeing, even when wearing glasses/contacts?: No Does the patient have difficulty concentrating, remembering, or making decisions?: No Patient able to express need for assistance with ADLs?: Yes Does the patient have difficulty dressing or bathing?: No Independently performs ADLs?: Yes (appropriate for developmental age) Does the patient have difficulty walking or climbing stairs?: Yes Weakness of Legs: Both Weakness of Arms/Hands: None  Permission Sought/Granted Permission sought to share information with : Family Supports Permission granted to share information with : Yes, Verbal Permission Granted  Share Information with NAME: daughter, Cheyne Kraemer @ [phone]           Emotional Assessment Appearance:: Appears stated age Attitude/Demeanor/Rapport: Gracious Affect (typically observed): Accepting Orientation: : Oriented to Place, Oriented to  Time, Oriented to Situation, Oriented to Self Alcohol / Substance Use: Not Applicable Psych Involvement: No (comment)  Admission diagnosis:  Acute cholecystitis [K81.0] Patient Active Problem List   Diagnosis Date Noted   Acute cholecystitis 03/12/2023   Primary osteoarthritis, left elbow 02/10/2023   Cyst of skin 12/26/2022   Elevated bilirubin 07/25/2022   Atopic dermatitis 07/15/2022   Acute cough 06/23/2022   Plantar fasciitis of  left foot 01/16/2022   Left leg swelling 01/29/2021   Parkinsonism 01/29/2021   Joint pain 09/14/2020   Pharyngitis 08/01/2020   Insomnia 08/01/2020   Seborrheic keratoses 03/16/2020   Venous stasis dermatitis of both lower extremities 09/03/2019   Eustachian tube dysfunction, bilateral 08/16/2019   Anxiety state 08/16/2019   History of 2019 novel coronavirus disease (COVID-19) 08/16/2019   COVID-19 virus infection  08/07/2019   Hypogonadism in male 01/14/2018   Complete tear of left rotator cuff 10/02/2017   Chronic left shoulder pain 10/02/2017   Acute pain of left shoulder 09/22/2017   GERD (gastroesophageal reflux disease) 07/02/2017   Rosacea 05/29/2017   Unilateral primary osteoarthritis, left knee 09/12/2016   Idiopathic chronic venous hypertension of both lower extremities with inflammation 09/12/2016   Muscle spasm 03/07/2016   Chronic bilateral ankle plain, right total ankle arthroplasty, left ankle posttraumatic osteoarthritis 08/18/2013   ED (erectile dysfunction) 09/10/2011   BPH (benign prostatic hyperplasia) 09/10/2011   Kidney stones 01/28/2011   Type 2 diabetes mellitus without complication, without long-term current use of insulin (HCC) 01/24/2011   OSA on CPAP 01/24/2011   Right lumbar radiculopathy 01/24/2011   Essential hypertension, benign 12/20/2010   Dyslipidemia 12/20/2010   PCP:  Everrett Coombe, DO Pharmacy:   [facility] [address] [place], [place] - [address] [place] [address] Phone: [phone] Fax: [phone]  [facility] - [place], [place] - [address] [place] [address] Phone: [phone] Fax: [phone]     Social Determinants of Health (SDOH) Social History: SDOH Screenings   Food Insecurity: No Food Insecurity (03/13/2023)  Housing: Patient Declined (03/13/2023)  Transportation Needs: No Transportation Needs (03/13/2023)  Utilities: Not At Risk (03/13/2023)  Alcohol Screen: Low Risk  (05/03/2022)  Depression (PHQ2-9): Low Risk  (12/26/2022)  Financial Resource Strain: Low Risk  (05/03/2022)  Physical Activity: Insufficiently Active (05/03/2022)  Social Connections: Unknown (01/15/2023)   Received from [facility]  Stress: No Stress Concern Present (05/03/2022)  Tobacco Use: High Risk (03/18/2023)   SDOH Interventions:     Readmission Risk  Interventions    03/19/2023   11:34 AM  Readmission Risk Prevention Plan  Post Dischage Appt Complete  Medication Screening Complete  Transportation Screening Complete

## 2023-03-20 DIAGNOSIS — K810 Acute cholecystitis: Secondary | ICD-10-CM | POA: Diagnosis not present

## 2023-03-20 LAB — GLUCOSE, CAPILLARY
Glucose-Capillary: 95 mg/dL (ref 70–99)
Glucose-Capillary: 121 mg/dL — ABNORMAL HIGH (ref 70–99)
Glucose-Capillary: 168 mg/dL — ABNORMAL HIGH (ref 70–99)
Glucose-Capillary: 194 mg/dL — ABNORMAL HIGH (ref 70–99)

## 2023-03-20 MED ORDER — LACTATED RINGERS IV SOLN: INTRAVENOUS | Status: DC

## 2023-03-20 MED ORDER — POTASSIUM CHLORIDE CRYS ER 20 MEQ PO TBCR
40.0000 meq | EXTENDED_RELEASE_TABLET | Freq: Two times a day (BID) | ORAL | Status: AC
  Administered 2023-03-20 (×2): 40 meq via ORAL
  Filled 2023-03-20 (×2): qty 2

## 2023-03-20 NOTE — Progress Notes (Signed)
PROGRESS NOTE    Jonathan Yu  ZOX:[id number] DOB: [birth year] DOA: 03/12/2023 PCP: Everrett Coombe, DO   Brief Narrative: 72 year old with past medical history significant for hypertension, hyperlipidemia, diabetes type 2, OSA, Parkinson disease, hypogonadism who presents to ED complaining of abdominal pain. On further  evaluation he was found to be febrile temperature 101, lactic acid 1.2, white blood cell 7.6.  CT abdomen and pelvis with finding consistent with acute cholecystitis.  Ultrasound abdomen showed cholelithiasis with positive graphic sign.  Surgery was consulted, patient underwent MRCP that showed numerous small gallstones and sludge  within the gallbladder, including near the gallbladder neck and gallbladder wall thickening.  finding consistent with acute cholecystitis.  No biliary ductal dilation or choledocholithiasis.  Patient underwent subtotal laparoscopic cholecystectomy on 03/14/2023 by Dr. Cliffton Asters with drain placement.  CT abdomen and pelvis 8/5 noted evacuation of the gallbladder with surgical drain noted, numerous calcified gallstone within the gallbladder fossa no loculated perihepatic or intra abdominal fluid collection. GI was consulted and patient underwent ERCP with stent placement for concern of bile leak on 8/6    Assessment & Plan:   Principal Problem:   Acute cholecystitis Active Problems:   Essential hypertension, benign   Dyslipidemia   Type 2 diabetes mellitus without complication, without long-term current use of insulin (HCC)   OSA on CPAP   BPH (benign prostatic hyperplasia)   Parkinsonism   1-Acute cholecystitis status post subtotal cholecystectomy Concern for bile leak -Patient presented with abdominal pain, ultrasound consistent with cholelithiasis and positive Murphy sign, MRCP with numerous  gallstone,  -Patient underwent subtotal cholecystectomy by Dr. Cliffton Asters on 8/2 -Repeated CT abdomen and pelvis 8/5 noted evacuation of gallbladder with  surgical drain in place, numerous calcified gallstone within the gallbladder fossa, no loculated fluid collection -Completed  IV Zosyn 8/07. -Pain management -Underwent ERCP and stent placement due to concern for bile leak 8/6 -Further management per general surgery Plan to continue with IV fluids, prevent dehydration, drain with 650 cc out put in 24 hours.   2-Elevated bilirubin secondary to acute cholecystitis Trending down.   3-Essential Hypertension: Continue with  losartan Holding furosemide  Hyperlipidemia: Holding atorvastatin due to transaminases  Diabetes Type II: Continue to hold home regimen metformin and glipizide Continue with sliding scale insulin  Parkinson's disease: Continue with Sinemet buspirone Requip  BPH -- Tamsulosin 0.8 mg p.o. daily   OSA -- Nocturnal CPAP   Morbid obesity Body mass index is 43.51 kg/m.  Discussed with patient needs for aggressive lifestyle changes/weight loss as this complicates all facets of care.  Outpatient follow-up with PCP.      Estimated body mass index is 43.51 kg/m as calculated from the following:   Height as of this encounter: 5\' 5"  (1.651 m).   Weight as of this encounter: 118.6 kg.   DVT prophylaxis: SCD, lovenox Code Status: Full code Family Communication: Disposition Plan:  Status is: Inpatient Remains inpatient appropriate because: management of cholecystitis.     Consultants:  Surgery  GI  Procedures:  Cholecystectomy   Antimicrobials:    Subjective: Denies worsening abdominal pain. Tolerating full liquid diet, passing gas.  Drain out put 650 in 24 hours. Feels anxious.  Objective: Vitals:   03/19/23 0559 03/19/23 1608 03/19/23 2104 03/20/23 0529  BP: (!) 161/99 (!) 120/59 129/85 (!) 144/83  Pulse: 78 72 74 (!) 57  Resp: 19 16 18 17   Temp: 97.8 F (36.6 C) (!) 97.5 F (36.4 C) (!) 97.5 F (36.4 C) 97.7 F (36.5  C)  TempSrc: Oral Oral Oral Oral  SpO2: 93% 94% 94% 96%  Weight:       Height:        Intake/Output Summary (Last 24 hours) at 03/20/2023 1219 Last data filed at 03/20/2023 1000 Gross per 24 hour  Intake 1088.77 ml  Output 1950 ml  Net -861.23 ml   Filed Weights   03/12/23 0738 03/14/23 1328 03/18/23 1225  Weight: 118.6 kg 118.6 kg 118.6 kg    Examination:  General exam: NAD Respiratory system: CTA Cardiovascular system: S 1, S 2 RRR Gastrointestinal system: BS present, soft, mild distended. Horace Porteous in placed.  Central nervous system: Alert.  Extremities: no edema Data Reviewed: I have personally reviewed following labs and imaging studies  CBC: Recent Labs  Lab 03/14/23 0416 03/15/23 0529 03/16/23 0541 03/18/23 0430 03/19/23 0424  WBC 7.5 5.8 8.5 6.2 7.4  HGB 14.0 13.5 12.8* 13.0 14.7  HCT 42.4 41.2 39.5 40.5 44.6  MCV 91.0 89.2 90.4 91.2 91.4  PLT 165 197 244 210 246   Basic Metabolic Panel: Recent Labs  Lab 03/14/23 0416 03/15/23 0529 03/16/23 0541 03/17/23 0430 03/18/23 0430 03/19/23 0424 03/20/23 0500  NA 136   < > 139 139 137 137 138  K 3.7   < > 3.3* 4.1 3.8 4.5 3.4*  CL 101   < > 104 107 102 102 101  CO2 27   < > 26 25 25 25 28   GLUCOSE 100*   < > 139* 115* 106* 121* 99  BUN 12   < > 19 15 10 13 13   CREATININE 0.76   < > 0.83 0.85 0.72 0.89 1.00  CALCIUM 8.4*   < > 8.9 8.5* 8.7* 9.2 8.8*  MG 2.0  --   --   --   --   --   --    < > = values in this interval not displayed.   GFR: Estimated Creatinine Clearance: 82 mL/min (by C-G formula based on SCr of 1 mg/dL). Liver Function Tests: Recent Labs  Lab 03/16/23 0541 03/17/23 0430 03/18/23 0430 03/19/23 0424 03/20/23 0500  AST 36 33 27 27 26   ALT 48* 43 29 35 32  ALKPHOS 42 39 36* 44 42  BILITOT 1.3* 1.4* 1.5* 1.4* 1.6*  PROT 6.3* 6.3* 6.0* 6.7 6.1*  ALBUMIN 3.0* 3.0* 2.9* 3.1* 2.9*   Recent Labs  Lab 03/19/23 0424  LIPASE 24   No results for input(s): "AMMONIA" in the last 168 hours. Coagulation Profile: No results for input(s): "INR", "PROTIME" in  the last 168 hours. Cardiac Enzymes: No results for input(s): "CKTOTAL", "CKMB", "CKMBINDEX", "TROPONINI" in the last 168 hours.  BNP (last 3 results) No results for input(s): "PROBNP" in the last 8760 hours. HbA1C: No results for input(s): "HGBA1C" in the last 72 hours. CBG: Recent Labs  Lab 03/19/23 1118 03/19/23 1610 03/19/23 2109 03/20/23 0723 03/20/23 1150  GLUCAP 164* 173* 140* 95 121*   Lipid Profile: No results for input(s): "CHOL", "HDL", "LDLCALC", "TRIG", "CHOLHDL", "LDLDIRECT" in the last 72 hours. Thyroid Function Tests: No results for input(s): "TSH", "T4TOTAL", "FREET4", "T3FREE", "THYROIDAB" in the last 72 hours. Anemia Panel: No results for input(s): "VITAMINB12", "FOLATE", "FERRITIN", "TIBC", "IRON", "RETICCTPCT" in the last 72 hours. Sepsis Labs: No results for input(s): "PROCALCITON", "LATICACIDVEN" in the last 168 hours.   Recent Results (from the past 240 hour(s))  Culture, blood (Routine X 2) w Reflex to ID Panel     Status: None  Collection Time: 03/13/23 12:15 AM   Specimen: BLOOD  Result Value Ref Range Status   Specimen Description   Final    BLOOD BLOOD LEFT HAND Performed at [facility], [address] [place], [place] [address]    Special Requests   Final    BOTTLES DRAWN AEROBIC AND ANAEROBIC Blood Culture adequate volume Performed at [facility], [address] [place], [place] [address]    Culture   Final    NO GROWTH 5 DAYS Performed at [facility], [address], [place], [place] [address]    Report Status 03/18/2023 FINAL  Final  Culture, blood (Routine X 2) w Reflex to ID Panel     Status: None   Collection Time: 03/13/23 12:22 AM   Specimen: BLOOD  Result Value Ref Range Status   Specimen Description   Final    BLOOD BLOOD RIGHT HAND Performed at [facility], [address], [place], [place] [address]    Special Requests   Final    BOTTLES DRAWN AEROBIC  AND ANAEROBIC Blood Culture adequate volume Performed at [facility], [address] [place], [place] [address]    Culture   Final    NO GROWTH 5 DAYS Performed at [facility], [address], [place], [place] [address]    Report Status 03/18/2023 FINAL  Final  MRSA Next Gen by PCR, Nasal     Status: None   Collection Time: 03/14/23 10:46 AM   Specimen: Nasal Mucosa; Nasal Swab  Result Value Ref Range Status   MRSA by PCR Next Gen NOT DETECTED NOT DETECTED Final    Comment: (NOTE) The GeneXpert MRSA Assay (FDA approved for NASAL specimens only), is one component of a comprehensive MRSA colonization surveillance program. It is not intended to diagnose MRSA infection nor to guide or monitor treatment for MRSA infections. Test performance is not FDA approved in patients less than 37 years old. Performed at [facility], [address] [place], [place] [address]          Radiology Studies: DG ERCP  Result Date: 03/18/2023 CLINICAL DATA:  [id number] Surgery, elective [id number] subtotal cholecystectomy EXAM: ERCP TECHNIQUE: Multiple spot images obtained with the fluoroscopic device and submitted for interpretation post-procedure. COMPARISON:  CT from previous day FINDINGS: A series of fluoroscopic spot images document endoscopic cannulation and opacification of the CBD. Calcified gallstones are evident, with some contrast extending to the region of the stones. IMPRESSION: Endoscopic CBD cannulation and intervention as above, suspected leak from cystic duct stump. Electronically Signed   By: Corlis Leak M.D.   On: 03/18/2023 18:32        Scheduled Meds:  acetaminophen  1,000 mg Oral TID   busPIRone  5 mg Oral TID   Carbidopa-Levodopa ER  1 tablet Oral Daily   docusate sodium  100 mg Oral BID   enoxaparin (LOVENOX) injection  60 mg Subcutaneous Q24H   insulin aspart  0-9 Units Subcutaneous TID AC & HS   lidocaine  1 patch Transdermal Q24H    losartan  50 mg Oral Daily   pantoprazole (PROTONIX) IV  40 mg Intravenous Q24H   polyethylene glycol  17 g Oral BID   potassium chloride  40 mEq Oral BID   rOPINIRole  2 mg Oral TID   tamsulosin  0.8 mg Oral Daily   Continuous Infusions:  sodium chloride 10 mL/hr at 03/18/23 1921   lactated  ringers       LOS: 8 days    Time spent: 35 minutes    Kareena Arrambide A Keon Pender, MD [facility]   If 7PM-7AM, please contact night-coverage [email or web address]  03/20/2023, 12:19 PM

## 2023-03-20 NOTE — Progress Notes (Signed)
   03/20/23 2016  BiPAP/CPAP/SIPAP  BiPAP/CPAP/SIPAP Pt Type Adult (prefers self placement)  BiPAP/CPAP/SIPAP DREAMSTATIOND  Mask Type Nasal mask (from home)  FiO2 (%) 21 %  Patient Home Equipment No (mask from hme)  Auto Titrate Yes (5-20)  CPAP/SIPAP surface wiped down Yes  BiPAP/CPAP /SiPAP Vitals  Pulse Rate 65  Resp 16  SpO2 94 %  Bilateral Breath Sounds Clear;Diminished  MEWS Score/Color  MEWS Score 0  MEWS Score Color Green

## 2023-03-20 NOTE — Progress Notes (Signed)
2 Days Post-Op   Subjective/Chief Complaint: Comfortable this am 650 out of the drain last 24 hours   Objective: Vital signs in last 24 hours: Temp:  [97.5 F (36.4 C)-97.7 F (36.5 C)] 97.7 F (36.5 C) (08/08 0529) Pulse Rate:  [57-74] 57 (08/08 0529) Resp:  [16-18] 17 (08/08 0529) BP: (120-144)/(59-85) 144/83 (08/08 0529) SpO2:  [94 %-96 %] 96 % (08/08 0529) FiO2 (%):  [21 %] 21 % (08/07 2312) Last BM Date : 03/19/23  Intake/Output from previous day: 08/07 0701 - 08/08 0700 In: 1533.5 [P.O.:1180; I.V.:210; IV Piggyback:143.5] Out: 1700 [Urine:1050; Drains:650] Intake/Output this shift: Total I/O In: -  Out: 200 [Urine:200]  Exam: Awake and alert Abdomen soft, drain with bile  Lab Results:  Recent Labs    03/18/23 0430 03/19/23 0424  WBC 6.2 7.4  HGB 13.0 14.7  HCT 40.5 44.6  PLT 210 246   BMET Recent Labs    03/19/23 0424 03/20/23 0500  NA 137 138  K 4.5 3.4*  CL 102 101  CO2 25 28  GLUCOSE 121* 99  BUN 13 13  CREATININE 0.89 1.00  CALCIUM 9.2 8.8*   PT/INR No results for input(s): "LABPROT", "INR" in the last 72 hours. ABG No results for input(s): "PHART", "HCO3" in the last 72 hours.  Invalid input(s): "PCO2", "PO2"  Studies/Results: DG ERCP  Result Date: 03/18/2023 CLINICAL DATA:  [id number] Surgery, elective [id number] subtotal cholecystectomy EXAM: ERCP TECHNIQUE: Multiple spot images obtained with the fluoroscopic device and submitted for interpretation post-procedure. COMPARISON:  CT from previous day FINDINGS: A series of fluoroscopic spot images document endoscopic cannulation and opacification of the CBD. Calcified gallstones are evident, with some contrast extending to the region of the stones. IMPRESSION: Endoscopic CBD cannulation and intervention as above, suspected leak from cystic duct stump. Electronically Signed   By: Corlis Leak M.D.   On: 03/18/2023 18:32    Anti-infectives: Anti-infectives (From admission, onward)    Start      Dose/Rate Route Frequency Ordered Stop   03/19/23 1200  piperacillin-tazobactam (ZOSYN) IVPB 3.375 g        3.375 g 12.5 mL/hr over 240 Minutes Intravenous Every 8 hours 03/19/23 0822 03/20/23 1159   03/12/23 2000  piperacillin-tazobactam (ZOSYN) IVPB 3.375 g  Status:  Discontinued        3.375 g 12.5 mL/hr over 240 Minutes Intravenous Every 8 hours 03/12/23 1230 03/19/23 0822   03/12/23 1245  piperacillin-tazobactam (ZOSYN) IVPB 3.375 g        3.375 g 100 mL/hr over 30 Minutes Intravenous  Once 03/12/23 1230 03/12/23 1325       Assessment/Plan: POD 6 s/p Laparoscopic subtotal cholecystectomy for acute on chronic gangrenous cholecystitis by Dr. Cliffton Asters on 03/14/23  -s/p ERCP with stent  Bile drainage still high Continue IV fluids Hopefully the drainage will decrease.  Would like it to be less before going home to avoid dehydration LFT's stable Advancing to soft diet  Abigail Miyamoto MD 03/20/2023

## 2023-03-21 DIAGNOSIS — K810 Acute cholecystitis: Secondary | ICD-10-CM | POA: Diagnosis not present

## 2023-03-21 LAB — GLUCOSE, CAPILLARY
Glucose-Capillary: 112 mg/dL — ABNORMAL HIGH (ref 70–99)
Glucose-Capillary: 174 mg/dL — ABNORMAL HIGH (ref 70–99)
Glucose-Capillary: 187 mg/dL — ABNORMAL HIGH (ref 70–99)
Glucose-Capillary: 215 mg/dL — ABNORMAL HIGH (ref 70–99)

## 2023-03-21 MED ORDER — HYDRALAZINE HCL 20 MG/ML IJ SOLN: 5.0000 mg | Freq: Four times a day (QID) | INTRAMUSCULAR | Status: DC | PRN

## 2023-03-21 MED ORDER — AMLODIPINE BESYLATE 5 MG PO TABS
5.0000 mg | ORAL_TABLET | Freq: Every day | ORAL | Status: DC
  Administered 2023-03-21 – 2023-03-24 (×4): 5 mg via ORAL
  Filled 2023-03-21 (×4): qty 1

## 2023-03-21 NOTE — Progress Notes (Signed)
Physical Therapy Treatment Patient Details Name: Jonathan Yu MRN: [id number] DOB: 1951/07/18 Today's Date: 03/21/2023   History of Present Illness Jonathan Yu is a 72 y.o. male presented with abd pain;CTabd/pelvis - Findings highly concerning for acute cholecystitis.7/2 pt s/p laparoscopic subtotal cholecystectomy. Pt s/p ERCP and stent 8/6. PHMx: HLD, HTN OSA, DM2 , parkinson disease, hypogonadism.    PT Comments  Pt with significant improvement in pain and mobility compared to last session, pt up in recliner and eating lunch. Pt amb 250 ft with RW, no overt LOB, clearing past obstacles, completing turns, denies pain. Pt encouraged to amb multiple times daily with nursing as able and pt in agreement.    If plan is discharge home, recommend the following: Assist for transportation;Help with stairs or ramp for entrance;A little help with walking and/or transfers;A little help with bathing/dressing/bathroom;Assistance with cooking/housework   Can travel by private vehicle        Equipment Recommendations  None recommended by PT    Recommendations for Other Services       Precautions / Restrictions Precautions Precautions: Fall Restrictions Weight Bearing Restrictions: No     Mobility  Bed Mobility               General bed mobility comments: in recliner upon arrival    Transfers Overall transfer level: Needs assistance Equipment used: None Transfers: Sit to/from Stand Sit to Stand: Supervision           General transfer comment: pt using BUE to assist in powering to stand, good steadiness and no AD needed, denies pain, no LOB posterior    Ambulation/Gait Ambulation/Gait assistance: Supervision Gait Distance (Feet): 250 Feet Assistive device: Rolling walker (2 wheels) Gait Pattern/deviations: Step-to pattern, Decreased stride length, Shuffle Gait velocity: decreased     General Gait Details: Parkinson's gait with short choppy steps, heavy cues for  increasing step length and foot clearance with minimal improvement, good steadiness with turns and navigating around obstacles, no overt LOB   Stairs             Wheelchair Mobility     Tilt Bed    Modified Rankin (Stroke Patients Only)       Balance Overall balance assessment: Needs assistance         Standing balance support: During functional activity, Reliant on assistive device for balance, Bilateral upper extremity supported Standing balance-Leahy Scale: Fair Standing balance comment: static able to release BUE, able to take a few steps in room without AD, RW for distance                            Cognition Arousal: Alert Behavior During Therapy: [facility] for tasks assessed/performed Overall Cognitive Status: Within Functional Limits for tasks assessed                                          Exercises      General Comments        Pertinent Vitals/Pain Pain Assessment Pain Assessment: No/denies pain    Home Living                          Prior Function            PT Goals (current goals can now be found in the care plan section) Acute  Rehab PT Goals PT Goal Formulation: With patient Time For Goal Achievement: 03/30/23 Potential to Achieve Goals: Good Progress towards PT goals: Progressing toward goals    Frequency    Min 1X/week      PT Plan      Co-evaluation              AM-PAC PT "6 Clicks" Mobility   Outcome Measure  Help needed turning from your back to your side while in a flat bed without using bedrails?: A Lot Help needed moving from lying on your back to sitting on the side of a flat bed without using bedrails?: A Lot Help needed moving to and from a bed to a chair (including a wheelchair)?: A Little Help needed standing up from a chair using your arms (e.g., wheelchair or bedside chair)?: A Little Help needed to walk in hospital room?: A Little Help needed climbing 3-5 steps  with a railing? : A Little 6 Click Score: 16    End of Session Equipment Utilized During Treatment: Gait belt Activity Tolerance: Patient tolerated treatment well Patient left: in chair;with call bell/phone within reach Nurse Communication: Mobility status PT Visit Diagnosis: Other abnormalities of gait and mobility (R26.89);Difficulty in walking, not elsewhere classified (R26.2)     Time: 2952-8413 PT Time Calculation (min) (ACUTE ONLY): 19 min  Charges:    $Gait Training: 8-22 mins PT General Charges $$ ACUTE PT VISIT: 1 Visit                     Tori Xandra Laramee PT, DPT 03/21/23, 12:06 PM

## 2023-03-21 NOTE — Plan of Care (Signed)
  Problem: Education: Goal: Knowledge of General Education information will improve Description: Including pain rating scale, medication(s)/side effects and non-pharmacologic comfort measures Outcome: Progressing   Problem: Health Behavior/Discharge Planning: Goal: Ability to manage health-related needs will improve Outcome: Progressing   Problem: Clinical Measurements: Goal: Ability to maintain clinical measurements within normal limits will improve Outcome: Progressing   Problem: Activity: Goal: Risk for activity intolerance will decrease Outcome: Progressing   Problem: Coping: Goal: Level of anxiety will decrease Outcome: Progressing   Problem: Nutrition: Goal: Adequate nutrition will be maintained Outcome: Adequate for Discharge

## 2023-03-21 NOTE — Progress Notes (Signed)
3 Days Post-Op  Subjective: CC: Some RUQ abdominal pain that is well controlled. Not worse with movement or after po intake. Tolerating diet without n/v. Small bm yesterday. Voiding. Mobilizing. JP drain output down from 650cc to 215cc/24 hours.   Objective: Vital signs in last 24 hours: Temp:  [97.5 F (36.4 C)-98.5 F (36.9 C)] 97.5 F (36.4 C) (08/09 0458) Pulse Rate:  [55-70] 56 (08/09 0730) Resp:  [14-19] 16 (08/09 0609) BP: (134-194)/(65-98) 174/98 (08/09 0730) SpO2:  [94 %-97 %] 97 % (08/09 0609) FiO2 (%):  [21 %] 21 % (08/08 2016) Last BM Date : 03/20/23  Intake/Output from previous day: 08/08 0701 - 08/09 0700 In: 1846.6 [P.O.:330; I.V.:1516.6] Out: 1965 [Urine:1750; Drains:215] Intake/Output this shift: No intake/output data recorded.  PE: Gen:  Alert, NAD, pleasant Card:  Reg Pulm:  Rate and effort normal Abd: Soft, mild distension, appropriately tender around laparoscopic incisions, no rigidity or guarding and otherwise NT, +BS. Incisions with glue intact appears well and are without drainage, bleeding, or signs of infection. JP drain bilious - 215cc/24 hours Psych: A&Ox3   Lab Results:  Recent Labs    03/19/23 0424 03/21/23 0432  WBC 7.4 5.6  HGB 14.7 12.7*  HCT 44.6 40.4  PLT 246 232   BMET Recent Labs    03/20/23 0500 03/21/23 0432  NA 138 135  K 3.4* 3.7  CL 101 102  CO2 28 24  GLUCOSE 99 123*  BUN 13 10  CREATININE 1.00 0.91  CALCIUM 8.8* 8.2*   PT/INR No results for input(s): "LABPROT", "INR" in the last 72 hours. CMP     Component Value Date/Time   NA 135 03/21/2023 0432   K 3.7 03/21/2023 0432   CL 102 03/21/2023 0432   CO2 24 03/21/2023 0432   GLUCOSE 123 (H) 03/21/2023 0432   BUN 10 03/21/2023 0432   CREATININE 0.91 03/21/2023 0432   CREATININE 0.88 07/18/2022 1333   CALCIUM 8.2 (L) 03/21/2023 0432   PROT 6.1 (L) 03/20/2023 0500   ALBUMIN 2.9 (L) 03/20/2023 0500   AST 26 03/20/2023 0500   ALT 32 03/20/2023 0500    ALKPHOS 42 03/20/2023 0500   BILITOT 1.6 (H) 03/20/2023 0500   GFRNONAA >60 03/21/2023 0432   GFRNONAA 75 03/16/2020 1038   GFRAA 87 03/16/2020 1038   Lipase     Component Value Date/Time   LIPASE 24 03/19/2023 0424    Studies/Results: No results found.  Anti-infectives: Anti-infectives (From admission, onward)    Start     Dose/Rate Route Frequency Ordered Stop   03/19/23 1200  piperacillin-tazobactam (ZOSYN) IVPB 3.375 g        3.375 g 12.5 mL/hr over 240 Minutes Intravenous Every 8 hours 03/19/23 0822 03/20/23 0816   03/12/23 2000  piperacillin-tazobactam (ZOSYN) IVPB 3.375 g  Status:  Discontinued        3.375 g 12.5 mL/hr over 240 Minutes Intravenous Every 8 hours 03/12/23 1230 03/19/23 0822   03/12/23 1245  piperacillin-tazobactam (ZOSYN) IVPB 3.375 g        3.375 g 100 mL/hr over 30 Minutes Intravenous  Once 03/12/23 1230 03/12/23 1325        Assessment/Plan POD 7 s/p Laparoscopic subtotal cholecystectomy for acute on chronic gangrenous cholecystitis by Dr. Cliffton Asters on 03/14/23 - Completed post op abx. CT 8/5 w/ no loculated perihepatic or intra-abdominal fluid collections. Afebrile. WBC wnl.  - S/p ERCP and CBD stenting on 8/6. Drain output still bilious but downtrending. Monitor. Would like  to see this continuing to downtrend and clearing up before d/c.   - CT w/ residual calcified gallstones. Discussed with attending. No plans for further surgery inpatient. Can discuss if he needs an additional surgery as outpatient during his f/u appointment.  - Mobilize - therapies  - Pulm toilet   FEN - Soft. Cont bowel regimen. IVF per [facility] VTE - SCDs, Lovenox  ID - Zosyn completed post op. None currently.    LOS: 9 days    Jacinto Halim , [facility] 03/21/2023, 8:31 AM Please see Amion for pager number during day hours 7:00am-4:30pm

## 2023-03-21 NOTE — Progress Notes (Signed)
Sorry this is so late but the patient has a BP of 180/81 and 66 pulse.  says he feels out of sorts and is in pain.  Was given Dilaudid 1 mg now.

## 2023-03-21 NOTE — Progress Notes (Signed)
PROGRESS NOTE    Add Jezewski  ZOX:[id number] DOB: Oct 28, 1950 DOA: 03/12/2023 PCP: Everrett Coombe, DO   Brief Narrative: 72 year old with past medical history significant for hypertension, hyperlipidemia, diabetes type 2, OSA, Parkinson disease, hypogonadism who presents to ED complaining of abdominal pain. On further  evaluation he was found to be febrile temperature 101, lactic acid 1.2, white blood cell 7.6.  CT abdomen and pelvis with finding consistent with acute cholecystitis.  Ultrasound abdomen showed cholelithiasis with positive graphic sign.  Surgery was consulted, patient underwent MRCP that showed numerous small gallstones and sludge  within the gallbladder, including near the gallbladder neck and gallbladder wall thickening.  finding consistent with acute cholecystitis.  No biliary ductal dilation or choledocholithiasis.  Patient underwent subtotal laparoscopic cholecystectomy on 03/14/2023 by Dr. Cliffton Asters with drain placement.  CT abdomen and pelvis 8/5 noted evacuation of the gallbladder with surgical drain noted, numerous calcified gallstone within the gallbladder fossa no loculated perihepatic or intra abdominal fluid collection. GI was consulted and patient underwent ERCP with stent placement for concern of bile leak on 8/6    Assessment & Plan:   Principal Problem:   Acute cholecystitis Active Problems:   Essential hypertension, benign   Dyslipidemia   Type 2 diabetes mellitus without complication, without long-term current use of insulin (HCC)   OSA on CPAP   BPH (benign prostatic hyperplasia)   Parkinsonism   1-Acute cholecystitis status post subtotal cholecystectomy Concern for bile leak -Patient presented with abdominal pain, ultrasound consistent with cholelithiasis and positive Murphy sign, MRCP with numerous  gallstone,  -Patient underwent subtotal cholecystectomy by Dr. Cliffton Asters on 8/2 -Repeated CT abdomen and pelvis 8/5 noted evacuation of gallbladder with  surgical drain in place, numerous calcified gallstone within the gallbladder fossa, no loculated fluid collection -Completed  IV Zosyn 8/07. -Pain management -Underwent ERCP and stent placement due to concern for bile leak 8/6 -Further management per general surgery -drain out put decreasing.  Discharge when clear by surgery   2-Elevated bilirubin secondary to acute cholecystitis Trending down.   3-Essential Hypertension: Continue with  losartan Holding furosemide  Hyperlipidemia: Holding atorvastatin due to transaminases  Diabetes Type II: Continue to hold home regimen metformin and glipizide Continue with sliding scale insulin  Parkinson's disease: Continue with Sinemet buspirone Requip  BPH -- Tamsulosin 0.8 mg p.o. daily   OSA -- Nocturnal CPAP   Morbid obesity Body mass index is 43.51 kg/m.  Discussed with patient needs for aggressive lifestyle changes/weight loss as this complicates all facets of care.  Outpatient follow-up with PCP.      Estimated body mass index is 43.51 kg/m as calculated from the following:   Height as of this encounter: 5\' 5"  (1.651 m).   Weight as of this encounter: 118.6 kg.   DVT prophylaxis: SCD, lovenox Code Status: Full code Family Communication: Disposition Plan:  Status is: Inpatient Remains inpatient appropriate because: management of cholecystitis.     Consultants:  Surgery  GI  Procedures:  Cholecystectomy   Antimicrobials:    Subjective: Had small BM , still having abdominal pain, discomfort.   Objective: Vitals:   03/21/23 0500 03/21/23 0609 03/21/23 0730 03/21/23 1430  BP: (!) 194/98 (!) 180/81 (!) 174/98 120/82  Pulse: (!) 55 66 (!) 56 74  Resp:  16  18  Temp:    98.5 F (36.9 C)  TempSrc:    Oral  SpO2:  97%  95%  Weight:      Height:  Intake/Output Summary (Last 24 hours) at 03/21/2023 1526 Last data filed at 03/21/2023 1400 Gross per 24 hour  Intake 2629.06 ml  Output 1905 ml  Net 724.06 ml    Filed Weights   03/12/23 0738 03/14/23 1328 03/18/23 1225  Weight: 118.6 kg 118.6 kg 118.6 kg    Examination:  General exam: NAD Respiratory system: CTA Cardiovascular system: S 1, S 2  RRR Gastrointestinal system: BS present, soft, nt Drain in placed.  Central nervous system: alert Extremities: no edema Data Reviewed: I have personally reviewed following labs and imaging studies  CBC: Recent Labs  Lab 03/15/23 0529 03/16/23 0541 03/18/23 0430 03/19/23 0424 03/21/23 0432  WBC 5.8 8.5 6.2 7.4 5.6  HGB 13.5 12.8* 13.0 14.7 12.7*  HCT 41.2 39.5 40.5 44.6 40.4  MCV 89.2 90.4 91.2 91.4 91.4  PLT 197 244 210 246 232   Basic Metabolic Panel: Recent Labs  Lab 03/17/23 0430 03/18/23 0430 03/19/23 0424 03/20/23 0500 03/21/23 0432  NA 139 137 137 138 135  K 4.1 3.8 4.5 3.4* 3.7  CL 107 102 102 101 102  CO2 25 25 25 28 24   GLUCOSE 115* 106* 121* 99 123*  BUN 15 10 13 13 10   CREATININE 0.85 0.72 0.89 1.00 0.91  CALCIUM 8.5* 8.7* 9.2 8.8* 8.2*   GFR: Estimated Creatinine Clearance: 90.1 mL/min (by C-G formula based on SCr of 0.91 mg/dL). Liver Function Tests: Recent Labs  Lab 03/16/23 0541 03/17/23 0430 03/18/23 0430 03/19/23 0424 03/20/23 0500  AST 36 33 27 27 26   ALT 48* 43 29 35 32  ALKPHOS 42 39 36* 44 42  BILITOT 1.3* 1.4* 1.5* 1.4* 1.6*  PROT 6.3* 6.3* 6.0* 6.7 6.1*  ALBUMIN 3.0* 3.0* 2.9* 3.1* 2.9*   Recent Labs  Lab 03/19/23 0424  LIPASE 24   No results for input(s): "AMMONIA" in the last 168 hours. Coagulation Profile: No results for input(s): "INR", "PROTIME" in the last 168 hours. Cardiac Enzymes: No results for input(s): "CKTOTAL", "CKMB", "CKMBINDEX", "TROPONINI" in the last 168 hours.  BNP (last 3 results) No results for input(s): "PROBNP" in the last 8760 hours. HbA1C: No results for input(s): "HGBA1C" in the last 72 hours. CBG: Recent Labs  Lab 03/20/23 1150 03/20/23 1649 03/20/23 2030 03/21/23 0731 03/21/23 1138  GLUCAP 121*  168* 194* 112* 174*   Lipid Profile: No results for input(s): "CHOL", "HDL", "LDLCALC", "TRIG", "CHOLHDL", "LDLDIRECT" in the last 72 hours. Thyroid Function Tests: No results for input(s): "TSH", "T4TOTAL", "FREET4", "T3FREE", "THYROIDAB" in the last 72 hours. Anemia Panel: No results for input(s): "VITAMINB12", "FOLATE", "FERRITIN", "TIBC", "IRON", "RETICCTPCT" in the last 72 hours. Sepsis Labs: No results for input(s): "PROCALCITON", "LATICACIDVEN" in the last 168 hours.   Recent Results (from the past 240 hour(s))  Culture, blood (Routine X 2) w Reflex to ID Panel     Status: None   Collection Time: 03/13/23 12:15 AM   Specimen: BLOOD  Result Value Ref Range Status   Specimen Description   Final    BLOOD BLOOD LEFT HAND Performed at [facility], [address] [place], [place] [address]    Special Requests   Final    BOTTLES DRAWN AEROBIC AND ANAEROBIC Blood Culture adequate volume Performed at [facility], [address], [place], [place] [address]    Culture   Final    NO GROWTH 5 DAYS Performed at [facility], [address] [place], [place] [address]    Report  Status 03/18/2023 FINAL  Final  Culture, blood (Routine X 2) w Reflex to ID Panel     Status: None   Collection Time: 03/13/23 12:22 AM   Specimen: BLOOD  Result Value Ref Range Status   Specimen Description   Final    BLOOD BLOOD RIGHT HAND Performed at [facility], [address] [place], [place] [address]    Special Requests   Final    BOTTLES DRAWN AEROBIC AND ANAEROBIC Blood Culture adequate volume Performed at [facility], [address] [place], [place] [address]    Culture   Final    NO GROWTH 5 DAYS Performed at [facility], [address] [place], [place] [address]    Report Status 03/18/2023 FINAL  Final  MRSA Next Gen by PCR, Nasal     Status: None   Collection Time: 03/14/23 10:46 AM    Specimen: Nasal Mucosa; Nasal Swab  Result Value Ref Range Status   MRSA by PCR Next Gen NOT DETECTED NOT DETECTED Final    Comment: (NOTE) The GeneXpert MRSA Assay (FDA approved for NASAL specimens only), is one component of a comprehensive MRSA colonization surveillance program. It is not intended to diagnose MRSA infection nor to guide or monitor treatment for MRSA infections. Test performance is not FDA approved in patients less than 70 years old. Performed at [facility], [address], [place], [place] [address]          Radiology Studies: No results found.      Scheduled Meds:  acetaminophen  1,000 mg Oral TID   amLODipine  5 mg Oral Daily   busPIRone  5 mg Oral TID   Carbidopa-Levodopa ER  1 tablet Oral Daily   docusate sodium  100 mg Oral BID   enoxaparin (LOVENOX) injection  60 mg Subcutaneous Q24H   insulin aspart  0-9 Units Subcutaneous TID AC & HS   lidocaine  1 patch Transdermal Q24H   losartan  50 mg Oral Daily   pantoprazole (PROTONIX) IV  40 mg Intravenous Q24H   polyethylene glycol  17 g Oral BID   rOPINIRole  2 mg Oral TID   tamsulosin  0.8 mg Oral Daily   Continuous Infusions:  sodium chloride 10 mL/hr at 03/18/23 1921   lactated ringers Stopped (03/21/23 0800)     LOS: 9 days    Time spent: 35 minutes    Teresita Fanton A Lillyona Polasek, MD [facility]   If 7PM-7AM, please contact night-coverage [email or web address]  03/21/2023, 3:26 PM

## 2023-03-21 NOTE — Care Management Important Message (Signed)
Important Message  Patient Details IM Letter given. Name: Mubashir Gere MRN: [id number] Date of Birth: 27-Oct-1950   Medicare Important Message Given:  Yes     Caren Macadam 03/21/2023, 10:26 AM

## 2023-03-21 NOTE — Plan of Care (Signed)
°  Problem: Coping: °Goal: Level of anxiety will decrease °Outcome: Progressing °  °

## 2023-03-22 DIAGNOSIS — K810 Acute cholecystitis: Secondary | ICD-10-CM | POA: Diagnosis not present

## 2023-03-22 LAB — PREALBUMIN: Prealbumin: 27 mg/dL (ref 18–38)

## 2023-03-22 LAB — GLUCOSE, CAPILLARY
Glucose-Capillary: 117 mg/dL — ABNORMAL HIGH (ref 70–99)
Glucose-Capillary: 147 mg/dL — ABNORMAL HIGH (ref 70–99)
Glucose-Capillary: 125 mg/dL — ABNORMAL HIGH (ref 70–99)
Glucose-Capillary: 162 mg/dL — ABNORMAL HIGH (ref 70–99)

## 2023-03-22 LAB — LIPASE, BLOOD: Lipase: 24 U/L (ref 11–51)

## 2023-03-22 LAB — HEPATIC FUNCTION PANEL
ALT: 36 U/L (ref 0–44)
AST: 21 U/L (ref 15–41)
Albumin: 2.8 g/dL — ABNORMAL LOW (ref 3.5–5.0)
Alkaline Phosphatase: 45 U/L (ref 38–126)
Bilirubin, Direct: 0.2 mg/dL (ref 0.0–0.2)
Indirect Bilirubin: 0.4 mg/dL (ref 0.3–0.9)
Total Bilirubin: 0.6 mg/dL (ref 0.3–1.2)
Total Protein: 6 g/dL — ABNORMAL LOW (ref 6.5–8.1)

## 2023-03-22 LAB — PHOSPHORUS: Phosphorus: 2.8 mg/dL (ref 2.5–4.6)

## 2023-03-22 LAB — MAGNESIUM: Magnesium: 2.1 mg/dL (ref 1.7–2.4)

## 2023-03-22 MED ORDER — POTASSIUM CHLORIDE CRYS ER 20 MEQ PO TBCR
40.0000 meq | EXTENDED_RELEASE_TABLET | Freq: Once | ORAL | Status: AC
  Administered 2023-03-22: 40 meq via ORAL
  Filled 2023-03-22: qty 2

## 2023-03-22 MED ORDER — ALBUTEROL SULFATE HFA 108 (90 BASE) MCG/ACT IN AERS: 2.0000 | INHALATION_SPRAY | Freq: Four times a day (QID) | RESPIRATORY_TRACT | Status: DC | PRN

## 2023-03-22 MED ORDER — LORATADINE 10 MG PO TABS: 10.0000 mg | ORAL_TABLET | Freq: Every day | ORAL | Status: DC | PRN

## 2023-03-22 MED ORDER — LINACLOTIDE 145 MCG PO CAPS
145.0000 ug | ORAL_CAPSULE | Freq: Every day | ORAL | Status: DC
  Filled 2023-03-22 (×2): qty 1

## 2023-03-22 MED ORDER — ADULT MULTIVITAMIN W/MINERALS CH
1.0000 | ORAL_TABLET | Freq: Every day | ORAL | Status: DC
  Administered 2023-03-22 – 2023-03-24 (×3): 1 via ORAL
  Filled 2023-03-22 (×3): qty 1

## 2023-03-22 MED ORDER — POTASSIUM CHLORIDE CRYS ER 20 MEQ PO TBCR
40.0000 meq | EXTENDED_RELEASE_TABLET | Freq: Once | ORAL | Status: DC
Start: 2023-03-22 — End: 2023-03-22

## 2023-03-22 MED ORDER — BENZONATATE 100 MG PO CAPS: 200.0000 mg | ORAL_CAPSULE | Freq: Two times a day (BID) | ORAL | Status: DC | PRN

## 2023-03-22 MED ORDER — VITAMIN D 25 MCG (1000 UNIT) PO TABS
2000.0000 [IU] | ORAL_TABLET | Freq: Every day | ORAL | Status: DC
  Administered 2023-03-22 – 2023-03-24 (×3): 2000 [IU] via ORAL
  Filled 2023-03-22 (×3): qty 2

## 2023-03-22 MED ORDER — ALBUTEROL SULFATE (2.5 MG/3ML) 0.083% IN NEBU: 2.5000 mg | INHALATION_SOLUTION | Freq: Four times a day (QID) | RESPIRATORY_TRACT | Status: DC | PRN

## 2023-03-22 NOTE — Progress Notes (Signed)
03/22/2023  Jonathan Yu [id number] 06/15/1951  CARE TEAM: PCP: Everrett Coombe, DO  Outpatient Care Team: Patient Care Team: Everrett Coombe, DO as PCP - General (Family Medicine) Odella Aquas, MD as Referring Physician (Urology) Nadara Mustard, MD as Consulting Physician (Orthopedic Surgery)  Inpatient Treatment Team: Treatment Team:  Alba Cory, MD Ccs, Md, MD Lilyan Gilford, MD Wright, [place] E, LPN Clydia Llano, RN Johnson, Femi Dory Peru, RN   Problem List:   Principal Problem:   Acute cholecystitis Active Problems:   Essential hypertension, benign   Dyslipidemia   Type 2 diabetes mellitus without complication, without long-term current use of insulin (HCC)   OSA on CPAP   BPH (benign prostatic hyperplasia)   Parkinsonism   03/14/2023  POST-OPERATIVE DIAGNOSIS: Acute on chronic gangrenous cholecystitis   PROCEDURE: Laparoscopic subtotal cholecystectomy   SURGEON: Marin Olp, MD  FINDINGS: Acute on chronic gangrenous cholecystitis.  Dense fibrotic type tissue over much of the wall of the gallbladder including all the way down near the infundibulum.  It was felt that attempts at dissection in this region would carry an increased risk for injury to surrounding structures given the difficulty with which we encountered in attempts at dissection.  Therefore, we opted to proceed with a subtotal cholecystectomy.  The anterior wall of the gallbladder was removed and the posterior wall was fulgurated.  A 19 French round Blake drain was left at conclusion.    03/18/2023  Procedure:               ERCP  Indications:              Treatment of bile leak  Providers:                Vida Rigger, MD  Assessment [facility] Stay = 10 days) 4 Days Post-Op    Gradually recovering.   Plan:  Acute gangrenous on top of chronic cholecystitis s/p Laparoscopic subtotal cholecystectomy for by Dr. Cliffton Asters on 03/14/23 Completed post op abx. (Zosyn x 5  days) CT 8/5 w/ no loculated perihepatic or intra-abdominal fluid collections.  Afebrile.  WBC wnl.   CT w/ residual calcified gallstones.  No need for acute surgical intervention at this time.  Could potentially benefit from completion cholecystectomy in 6-8 weeks.  We need to assess recovery and risks first.  Defer to operating surgeon Dr. Cliffton Asters.  Delayed bile leak  Not surprising given high risk with gangrenous cholecystitis amenable only to subtotal cholecystectomy  Internal drainage by stenting ERCP 8/6.  Defer to Gastroenterology on appropriateness of removal.  Assume that will happen in 6-8 weeks once leak has been resolved.  External drainage with Blake drain in gallbladder hepatic fossa.   Drain output still bilious but downtrending. Monitor.  Patient will need to go home with this with close outpatient follow-up.  Patient seems overwhelmed about the idea of managing himself so we will see if home health can be set up for him.  Defer to primary service on if he needs to be at a SNF instead.  Would like to see this continuing to downtrend and clearing up before d/c.  Hopefully can be removed as an outpatient.  Deferred need for bile leak HIDA scan repeat before removing.  - Mobilize - therapies  - Pulm toilet    FEN -advance to heart healthy diet soft. Cont bowel regimen. IVF per [facility] VTE - SCDs, Lovenox  ID - Zosyn completed post op. None currently.  -VTE prophylaxis-  SCDs, etc -mobilize as tolerated to help recovery -Disposition:  Disposition:  The patient is from: Home Anticipate discharge to:   Home health versus skilled nursing facility Anticipated Date of Discharge is: To be determined.  Hopefully as soon as Monday 8/12 Barriers to discharge:  Consultant clearance & sign off  , Transitions of Care, and Pending Clinical improvement (more likely than not)  Patient currently is NOT MEDICALLY STABLE for discharge from the hospital from a surgery standpoint.      I  reviewed nursing notes, hospitalist notes, last 24 h vitals and pain scores, last 48 h intake and output, last 24 h labs and trends, and last 24 h imaging results.  I have reviewed this patient's available data, including medical history, events of note, test results, etc as part of my evaluation.   A significant portion of that time was spent in counseling. Care during the described time interval was provided by me.  This care required moderate level of medical decision making.  03/22/2023    Subjective: (Chief complaint)  Patient with less pain.  Sitting up eating soft diet.  Hoping to go home soon but worried about managing drain by himself.  Has adult children at home with him.  Objective:  Vital signs:  Vitals:   03/21/23 0730 03/21/23 1430 03/21/23 2121 03/22/23 0538  BP: (!) 174/98 120/82 (!) 158/84 (!) 166/86  Pulse: (!) 56 74 70   Resp:  18 18 18   Temp:  98.5 F (36.9 C) (!) 97.5 F (36.4 C) 97.7 F (36.5 C)  TempSrc:  Oral Oral Oral  SpO2:  95% 96% 96%  Weight:      Height:        Last BM Date : 03/21/23  Intake/Output   Yesterday:  08/09 0701 - 08/10 0700 In: 1391.4 [P.O.:960; I.V.:431.4] Out: 860 [Urine:750; Drains:110] This shift:  No intake/output data recorded.  Bowel function:  Flatus: YES  BM:  No  Drain: Bilious   Physical Exam:  General: Pt awake/alert in no acute distress Eyes: PERRL, normal EOM.  Sclera clear.  No icterus Neuro: CN II-XII intact w/o focal sensory/motor deficits. Lymph: No head/neck/groin lymphadenopathy Psych:  No delerium/psychosis/paranoia.  Oriented x 4.  Mild masked face but seems to be oriented and interacting.  Asked appropriate questions HENT: Normocephalic, Mucus membranes moist.  No thrush Neck: Supple, No tracheal deviation.  No obvious thyromegaly Chest: No pain to chest wall compression.  Good respiratory excursion.  No audible wheezing CV:  Pulses intact.  Regular rhythm.  No major extremity edema MS:  Normal AROM mjr joints.  No obvious deformity  Abdomen: Soft.  Mildy distended.  Mildly tender at incisions only.  No evidence of peritonitis.  No incarcerated hernias.  Ext:  No deformity.  2+ edema.  No cyanosis Skin: No petechiae / purpurea.  No major sores.  Warm and dry    Results:   Cultures: Recent Results (from the past 720 hour(s))  Culture, blood (Routine X 2) w Reflex to ID Panel     Status: None   Collection Time: 03/13/23 12:15 AM   Specimen: BLOOD  Result Value Ref Range Status   Specimen Description   Final    BLOOD BLOOD LEFT HAND Performed at [facility], [address], [place], [place] [address]    Special Requests   Final    BOTTLES DRAWN AEROBIC AND ANAEROBIC Blood Culture adequate volume Performed at [facility], [address] [place],  [place] [address]    Culture   Final    NO GROWTH 5 DAYS Performed at [facility], [address] [place], [place] [address]    Report Status 03/18/2023 FINAL  Final  Culture, blood (Routine X 2) w Reflex to ID Panel     Status: None   Collection Time: 03/13/23 12:22 AM   Specimen: BLOOD  Result Value Ref Range Status   Specimen Description   Final    BLOOD BLOOD RIGHT HAND Performed at [facility], [address], [place], [place] [address]    Special Requests   Final    BOTTLES DRAWN AEROBIC AND ANAEROBIC Blood Culture adequate volume Performed at [facility], [address] [place], [place] [address]    Culture   Final    NO GROWTH 5 DAYS Performed at [facility], [address] [place], [place] [address]    Report Status 03/18/2023 FINAL  Final  MRSA Next Gen by PCR, Nasal     Status: None   Collection Time: 03/14/23 10:46 AM   Specimen: Nasal Mucosa; Nasal Swab  Result Value Ref Range Status   MRSA by PCR Next Gen NOT DETECTED NOT DETECTED Final    Comment: (NOTE) The GeneXpert MRSA Assay (FDA approved for  NASAL specimens only), is one component of a comprehensive MRSA colonization surveillance program. It is not intended to diagnose MRSA infection nor to guide or monitor treatment for MRSA infections. Test performance is not FDA approved in patients less than 94 years old. Performed at [facility], [address] [place], [place] [address]     Labs: Results for orders placed or performed during the hospital encounter of 03/12/23 (from the past 48 hour(s))  Glucose, capillary     Status: Abnormal   Collection Time: 03/20/23 11:50 AM  Result Value Ref Range   Glucose-Capillary 121 (H) 70 - 99 mg/dL    Comment: Glucose reference range applies only to samples taken after fasting for at least 8 hours.   Comment 1 Notify RN    Comment 2 Document in Chart   Glucose, capillary     Status: Abnormal   Collection Time: 03/20/23  4:49 PM  Result Value Ref Range   Glucose-Capillary 168 (H) 70 - 99 mg/dL    Comment: Glucose reference range applies only to samples taken after fasting for at least 8 hours.   Comment 1 Notify RN    Comment 2 Document in Chart   Glucose, capillary     Status: Abnormal   Collection Time: 03/20/23  8:30 PM  Result Value Ref Range   Glucose-Capillary 194 (H) 70 - 99 mg/dL    Comment: Glucose reference range applies only to samples taken after fasting for at least 8 hours.  CBC     Status: Abnormal   Collection Time: 03/21/23  4:32 AM  Result Value Ref Range   WBC 5.6 4.0 - 10.5 K/uL   RBC 4.42 4.22 - 5.81 MIL/uL   Hemoglobin 12.7 (L) 13.0 - 17.0 g/dL   HCT 02.7 25.3 - 66.4 %   MCV 91.4 80.0 - 100.0 fL   MCH 28.7 26.0 - 34.0 pg   MCHC 31.4 30.0 - 36.0 g/dL   RDW 40.3 47.4 - 25.9 %   Platelets 232 150 - 400 K/uL   nRBC 0.0 0.0 - 0.2 %    Comment: Performed at [facility], [address] [place],  [place] [address]  Basic metabolic panel     Status: Abnormal   Collection Time: 03/21/23  4:32 AM  Result Value Ref Range    Sodium 135 135 - 145 mmol/L   Potassium 3.7 3.5 - 5.1 mmol/L   Chloride 102 98 - 111 mmol/L   CO2 24 22 - 32 mmol/L   Glucose, Bld 123 (H) 70 - 99 mg/dL    Comment: Glucose reference range applies only to samples taken after fasting for at least 8 hours.   BUN 10 8 - 23 mg/dL   Creatinine, Ser 9.81 0.61 - 1.24 mg/dL   Calcium 8.2 (L) 8.9 - 10.3 mg/dL   GFR, Estimated >19 >14 mL/min    Comment: (NOTE) Calculated using the CKD-EPI Creatinine Equation (2021)    Anion gap 9 5 - 15    Comment: Performed at [facility], [address], [place], [place] [address]  Glucose, capillary     Status: Abnormal   Collection Time: 03/21/23  7:31 AM  Result Value Ref Range   Glucose-Capillary 112 (H) 70 - 99 mg/dL    Comment: Glucose reference range applies only to samples taken after fasting for at least 8 hours.  Glucose, capillary     Status: Abnormal   Collection Time: 03/21/23 11:38 AM  Result Value Ref Range   Glucose-Capillary 174 (H) 70 - 99 mg/dL    Comment: Glucose reference range applies only to samples taken after fasting for at least 8 hours.  Glucose, capillary     Status: Abnormal   Collection Time: 03/21/23  4:24 PM  Result Value Ref Range   Glucose-Capillary 187 (H) 70 - 99 mg/dL    Comment: Glucose reference range applies only to samples taken after fasting for at least 8 hours.  Glucose, capillary     Status: Abnormal   Collection Time: 03/21/23  9:23 PM  Result Value Ref Range   Glucose-Capillary 215 (H) 70 - 99 mg/dL    Comment: Glucose reference range applies only to samples taken after fasting for at least 8 hours.  Basic metabolic panel     Status: Abnormal   Collection Time: 03/22/23  4:51 AM  Result Value Ref Range   Sodium 139 135 - 145 mmol/L   Potassium 3.4 (L) 3.5 - 5.1 mmol/L   Chloride 106 98 - 111 mmol/L   CO2 24 22 - 32 mmol/L   Glucose, Bld 127 (H) 70 - 99 mg/dL    Comment: Glucose reference range applies only to samples taken after  fasting for at least 8 hours.   BUN 9 8 - 23 mg/dL   Creatinine, Ser 6.21 0.61 - 1.24 mg/dL   Calcium 8.4 (L) 8.9 - 10.3 mg/dL   GFR, Estimated >30 >86 mL/min    Comment: (NOTE) Calculated using the CKD-EPI Creatinine Equation (2021)    Anion gap 9 5 - 15    Comment: Performed at [facility], [address], [place], [place] [address]  Glucose, capillary     Status: Abnormal   Collection Time: 03/22/23  7:56 AM  Result Value Ref Range   Glucose-Capillary 117 (H) 70 - 99 mg/dL    Comment: Glucose reference range applies only to samples taken after fasting for at least 8 hours.    Imaging / Studies: No results found.  Medications / Allergies: per chart  Antibiotics: Anti-infectives (From admission, onward)    Start     Dose/Rate Route Frequency Ordered Stop  03/19/23 1200  piperacillin-tazobactam (ZOSYN) IVPB 3.375 g        3.375 g 12.5 mL/hr over 240 Minutes Intravenous Every 8 hours 03/19/23 0822 03/20/23 0816   03/12/23 2000  piperacillin-tazobactam (ZOSYN) IVPB 3.375 g  Status:  Discontinued        3.375 g 12.5 mL/hr over 240 Minutes Intravenous Every 8 hours 03/12/23 1230 03/19/23 0822   03/12/23 1245  piperacillin-tazobactam (ZOSYN) IVPB 3.375 g        3.375 g 100 mL/hr over 30 Minutes Intravenous  Once 03/12/23 1230 03/12/23 1325         Note: Portions of this report may have been transcribed using voice recognition software. Every effort was made to ensure accuracy; however, inadvertent computerized transcription errors may be present.   Any transcriptional errors that result from this process are unintentional.    Ardeth Sportsman, MD, FACS, MASCRS Esophageal, Gastrointestinal & Colorectal Surgery Robotic and Minimally Invasive Surgery  [facility] [address], [address] [place], [place] [address] [phone] Fax [phone] Main  CONTACT INFORMATION: Weekday (9AM-5PM): Call  [facility] main office at [phone] Weeknight (5PM-9AM) or Weekend/Holiday: Check EPIC "Web Links" tab & use "AMION" (password " [email or web address]") for General Surgery [facility] coverage  Please, DO NOT use SecureChat  (it is not reliable communication to reach operating surgeons & will lead to a delay in care).   Epic staff messaging available for outptient concerns needing 1-2 business day response.      03/22/2023  9:47 AM

## 2023-03-22 NOTE — Plan of Care (Signed)
  Problem: Education: Goal: Knowledge of General Education information will improve Description: Including pain rating scale, medication(s)/side effects and non-pharmacologic comfort measures Outcome: Progressing   Problem: Health Behavior/Discharge Planning: Goal: Ability to manage health-related needs will improve Outcome: Progressing   Problem: Clinical Measurements: Goal: Cardiovascular complication will be avoided Outcome: Progressing   Problem: Activity: Goal: Risk for activity intolerance will decrease Outcome: Progressing   Problem: Elimination: Goal: Will not experience complications related to bowel motility Outcome: Progressing   Problem: Coping: Goal: Level of anxiety will decrease Outcome: Not Progressing

## 2023-03-22 NOTE — Progress Notes (Signed)
PROGRESS NOTE    Jonathan Yu  ZOX:[id number] DOB: 1951-07-05 DOA: 03/12/2023 PCP: Everrett Coombe, DO   Brief Narrative: 72 year old with past medical history significant for hypertension, hyperlipidemia, diabetes type 2, OSA, Parkinson disease, hypogonadism who presents to ED complaining of abdominal pain. On further  evaluation he was found to be febrile temperature 101, lactic acid 1.2, white blood cell 7.6.  CT abdomen and pelvis with finding consistent with acute cholecystitis.  Ultrasound abdomen showed cholelithiasis with positive graphic sign.  Surgery was consulted, patient underwent MRCP that showed numerous small gallstones and sludge  within the gallbladder, including near the gallbladder neck and gallbladder wall thickening.  finding consistent with acute cholecystitis.  No biliary ductal dilation or choledocholithiasis.  Patient underwent subtotal laparoscopic cholecystectomy on 03/14/2023 by Dr. Cliffton Asters with drain placement.  CT abdomen and pelvis 8/5 noted evacuation of the gallbladder with surgical drain noted, numerous calcified gallstone within the gallbladder fossa no loculated perihepatic or intra abdominal fluid collection. GI was consulted and patient underwent ERCP with stent placement for concern of bile leak on 8/6    Assessment & Plan:   Principal Problem:   Acute cholecystitis Active Problems:   Essential hypertension, benign   Dyslipidemia   Type 2 diabetes mellitus without complication, without long-term current use of insulin (HCC)   OSA on CPAP   Right lumbar radiculopathy   BPH (benign prostatic hyperplasia)   Idiopathic chronic venous hypertension of both lower extremities with inflammation   Rosacea   GERD (gastroesophageal reflux disease)   Insomnia   Joint pain   Parkinsonism   Atopic dermatitis   Elevated bilirubin   1-Acute cholecystitis status post subtotal cholecystectomy Concern for bile leak -Patient presented with abdominal pain,  ultrasound consistent with cholelithiasis and positive Murphy sign, MRCP with numerous  gallstone,  -Patient underwent subtotal cholecystectomy by Dr. Cliffton Asters on 8/2 -Repeated CT abdomen and pelvis 8/5 noted evacuation of gallbladder with surgical drain in place, numerous calcified gallstone within the gallbladder fossa, no loculated fluid collection -Completed  IV Zosyn 8/07. -Pain management -Underwent ERCP and stent placement due to concern for bile leak 8/6 -Further management per general surgery -Drain out put decreasing. 110 last 24 hours.  Discharge when clear by surgery   2-Elevated bilirubin secondary to acute cholecystitis Trending down.   3-Essential Hypertension: Continue with  losartan Holding furosemide. Will add low dose Norvasc.   Hyperlipidemia: Holding atorvastatin due to transaminases  Diabetes Type II: Continue to hold home regimen metformin and glipizide Continue with sliding scale insulin  Parkinson's disease: Continue with Sinemet buspirone Requip  BPH -- Tamsulosin 0.8 mg p.o. daily   OSA -- Nocturnal CPAP   Morbid obesity Body mass index is 43.51 kg/m.  Discussed with patient needs for aggressive lifestyle changes/weight loss as this complicates all facets of care.  Outpatient follow-up with PCP.      Estimated body mass index is 43.51 kg/m as calculated from the following:   Height as of this encounter: 5\' 5"  (1.651 m).   Weight as of this encounter: 118.6 kg.   DVT prophylaxis: SCD, lovenox Code Status: Full code Family Communication: Disposition Plan:  Status is: Inpatient Remains inpatient appropriate because: management of cholecystitis.     Consultants:  Surgery  GI  Procedures:  Cholecystectomy   Antimicrobials:    Subjective: Had moderate < feels better. Pain improving.   Objective: Vitals:   03/21/23 0730 03/21/23 1430 03/21/23 2121 03/22/23 0538  BP: (!) 174/98 120/82 (!) 158/84 (!) 166/86  Pulse: (!) 56 74 70   Resp:   18 18 18   Temp:  98.5 F (36.9 C) (!) 97.5 F (36.4 C) 97.7 F (36.5 C)  TempSrc:  Oral Oral Oral  SpO2:  95% 96% 96%  Weight:      Height:        Intake/Output Summary (Last 24 hours) at 03/22/2023 1116 Last data filed at 03/22/2023 1100 Gross per 24 hour  Intake 1391.36 ml  Output 795 ml  Net 596.36 ml   Filed Weights   03/12/23 0738 03/14/23 1328 03/18/23 1225  Weight: 118.6 kg 118.6 kg 118.6 kg    Examination:  General exam: NAD Respiratory system: CTA Cardiovascular system: S 1, S 2 RRR Gastrointestinal system: BS present, soft, nt Drain in placed.  Central nervous system: Alert, follows command Extremities: no edema Data Reviewed: I have personally reviewed following labs and imaging studies  CBC: Recent Labs  Lab 03/16/23 0541 03/18/23 0430 03/19/23 0424 03/21/23 0432  WBC 8.5 6.2 7.4 5.6  HGB 12.8* 13.0 14.7 12.7*  HCT 39.5 40.5 44.6 40.4  MCV 90.4 91.2 91.4 91.4  PLT 244 210 246 232   Basic Metabolic Panel: Recent Labs  Lab 03/18/23 0430 03/19/23 0424 03/20/23 0500 03/21/23 0432 03/22/23 0451  NA 137 137 138 135 139  K 3.8 4.5 3.4* 3.7 3.4*  CL 102 102 101 102 106  CO2 25 25 28 24 24   GLUCOSE 106* 121* 99 123* 127*  BUN 10 13 13 10 9   CREATININE 0.72 0.89 1.00 0.91 0.81  CALCIUM 8.7* 9.2 8.8* 8.2* 8.4*   GFR: Estimated Creatinine Clearance: 101.2 mL/min (by C-G formula based on SCr of 0.81 mg/dL). Liver Function Tests: Recent Labs  Lab 03/16/23 0541 03/17/23 0430 03/18/23 0430 03/19/23 0424 03/20/23 0500  AST 36 33 27 27 26   ALT 48* 43 29 35 32  ALKPHOS 42 39 36* 44 42  BILITOT 1.3* 1.4* 1.5* 1.4* 1.6*  PROT 6.3* 6.3* 6.0* 6.7 6.1*  ALBUMIN 3.0* 3.0* 2.9* 3.1* 2.9*   Recent Labs  Lab 03/19/23 0424  LIPASE 24   No results for input(s): "AMMONIA" in the last 168 hours. Coagulation Profile: No results for input(s): "INR", "PROTIME" in the last 168 hours. Cardiac Enzymes: No results for input(s): "CKTOTAL", "CKMB",  "CKMBINDEX", "TROPONINI" in the last 168 hours.  BNP (last 3 results) No results for input(s): "PROBNP" in the last 8760 hours. HbA1C: No results for input(s): "HGBA1C" in the last 72 hours. CBG: Recent Labs  Lab 03/21/23 0731 03/21/23 1138 03/21/23 1624 03/21/23 2123 03/22/23 0756  GLUCAP 112* 174* 187* 215* 117*   Lipid Profile: No results for input(s): "CHOL", "HDL", "LDLCALC", "TRIG", "CHOLHDL", "LDLDIRECT" in the last 72 hours. Thyroid Function Tests: No results for input(s): "TSH", "T4TOTAL", "FREET4", "T3FREE", "THYROIDAB" in the last 72 hours. Anemia Panel: No results for input(s): "VITAMINB12", "FOLATE", "FERRITIN", "TIBC", "IRON", "RETICCTPCT" in the last 72 hours. Sepsis Labs: No results for input(s): "PROCALCITON", "LATICACIDVEN" in the last 168 hours.   Recent Results (from the past 240 hour(s))  Culture, blood (Routine X 2) w Reflex to ID Panel     Status: None   Collection Time: 03/13/23 12:15 AM   Specimen: BLOOD  Result Value Ref Range Status   Specimen Description   Final    BLOOD BLOOD LEFT HAND Performed at [facility], [address], [place], [place] [address]    Special Requests   Final    BOTTLES DRAWN AEROBIC AND  ANAEROBIC Blood Culture adequate volume Performed at [facility], [address], [place], [place] [address]    Culture   Final    NO GROWTH 5 DAYS Performed at [facility], [address] [place], [place] [address]    Report Status 03/18/2023 FINAL  Final  Culture, blood (Routine X 2) w Reflex to ID Panel     Status: None   Collection Time: 03/13/23 12:22 AM   Specimen: BLOOD  Result Value Ref Range Status   Specimen Description   Final    BLOOD BLOOD RIGHT HAND Performed at [facility], [address] [place], [place] [address]    Special Requests   Final    BOTTLES DRAWN AEROBIC AND ANAEROBIC Blood Culture adequate volume Performed at [facility], [address], [place], [place] [address]    Culture   Final    NO GROWTH 5 DAYS Performed at [facility], [address], [place], [place] [address]    Report Status 03/18/2023 FINAL  Final  MRSA Next Gen by PCR, Nasal     Status: None   Collection Time: 03/14/23 10:46 AM   Specimen: Nasal Mucosa; Nasal Swab  Result Value Ref Range Status   MRSA by PCR Next Gen NOT DETECTED NOT DETECTED Final    Comment: (NOTE) The GeneXpert MRSA Assay (FDA approved for NASAL specimens only), is one component of a comprehensive MRSA colonization surveillance program. It is not intended to diagnose MRSA infection nor to guide or monitor treatment for MRSA infections. Test performance is not FDA approved in patients less than 86 years old. Performed at [facility], [address], [place], [place] [address]          Radiology Studies: No results found.      Scheduled Meds:  acetaminophen  1,000 mg Oral TID   amLODipine  5 mg Oral Daily   busPIRone  5 mg Oral TID   Carbidopa-Levodopa ER  1 tablet Oral Daily   cholecalciferol  2,000 Units Oral Daily   docusate sodium  100 mg Oral BID   enoxaparin (LOVENOX) injection  60 mg Subcutaneous Q24H   insulin aspart  0-9 Units Subcutaneous TID AC & HS   lidocaine  1 patch Transdermal Q24H   linaclotide  145 mcg Oral Daily   losartan  50 mg Oral Daily   multivitamin with minerals  1 tablet Oral Daily   polyethylene glycol  17 g Oral BID   potassium chloride  40 mEq Oral Once   rOPINIRole  2 mg Oral TID   tamsulosin  0.8 mg Oral Daily   Continuous Infusions:  sodium chloride 10 mL/hr at 03/18/23 1921     LOS: 10 days    Time spent: 35 minutes    Kapono Luhn A Khylen Riolo, MD [facility]   If 7PM-7AM, please contact night-coverage [email or web address]  03/22/2023, 11:16 AM

## 2023-03-22 NOTE — Progress Notes (Signed)
   03/22/23 2045  BiPAP/CPAP/SIPAP  BiPAP/CPAP/SIPAP Pt Type Adult (Pt prefer self placement. Machine is ready to be worn)  BiPAP/CPAP/SIPAP DREAMSTATIOND  Mask Type Nasal mask (home)  Mask Size Medium  Respiratory Rate 18 breaths/min  FiO2 (%) 21 %  Patient Home Equipment No  Auto Titrate Yes (20/5)  CPAP/SIPAP surface wiped down Yes

## 2023-03-23 ENCOUNTER — Encounter (HOSPITAL_COMMUNITY): Payer: Self-pay | Admitting: Gastroenterology

## 2023-03-23 DIAGNOSIS — K810 Acute cholecystitis: Secondary | ICD-10-CM | POA: Diagnosis not present

## 2023-03-23 LAB — GLUCOSE, CAPILLARY
Glucose-Capillary: 127 mg/dL — ABNORMAL HIGH (ref 70–99)
Glucose-Capillary: 128 mg/dL — ABNORMAL HIGH (ref 70–99)
Glucose-Capillary: 146 mg/dL — ABNORMAL HIGH (ref 70–99)
Glucose-Capillary: 148 mg/dL — ABNORMAL HIGH (ref 70–99)

## 2023-03-23 LAB — CBC
HCT: 41.5 % (ref 39.0–52.0)
Hemoglobin: 13.5 g/dL (ref 13.0–17.0)
MCH: 29.5 pg (ref 26.0–34.0)
MCHC: 32.5 g/dL (ref 30.0–36.0)
MCV: 90.8 fL (ref 80.0–100.0)
Platelets: 257 10*3/uL (ref 150–400)
RBC: 4.57 MIL/uL (ref 4.22–5.81)
RDW: 13.2 % (ref 11.5–15.5)
WBC: 5.2 10*3/uL (ref 4.0–10.5)
nRBC: 0 % (ref 0.0–0.2)

## 2023-03-23 LAB — POTASSIUM: Potassium: 3.9 mmol/L (ref 3.5–5.1)

## 2023-03-23 NOTE — Progress Notes (Signed)
PROGRESS NOTE    Jonathan Yu  VWU:[id number] DOB: 05/25/1951 DOA: 03/12/2023 PCP: Everrett Coombe, DO   Brief Narrative: 72 year old with past medical history significant for hypertension, hyperlipidemia, diabetes type 2, OSA, Parkinson disease, hypogonadism who presents to ED complaining of abdominal pain. On further  evaluation he was found to be febrile temperature 101, lactic acid 1.2, white blood cell 7.6.  CT abdomen and pelvis with finding consistent with acute cholecystitis.  Ultrasound abdomen showed cholelithiasis with positive graphic sign.  Surgery was consulted, patient underwent MRCP that showed numerous small gallstones and sludge  within the gallbladder, including near the gallbladder neck and gallbladder wall thickening.  finding consistent with acute cholecystitis.  No biliary ductal dilation or choledocholithiasis.  Patient underwent subtotal laparoscopic cholecystectomy on 03/14/2023 by Dr. Cliffton Asters with drain placement.  CT abdomen and pelvis 8/5 noted evacuation of the gallbladder with surgical drain noted, numerous calcified gallstone within the gallbladder fossa no loculated perihepatic or intra abdominal fluid collection. GI was consulted and patient underwent ERCP with stent placement for concern of bile leak on 8/6    Assessment & Plan:   Principal Problem:   Acute cholecystitis Active Problems:   Essential hypertension, benign   Dyslipidemia   Type 2 diabetes mellitus without complication, without long-term current use of insulin (HCC)   OSA on CPAP   Right lumbar radiculopathy   BPH (benign prostatic hyperplasia)   Idiopathic chronic venous hypertension of both lower extremities with inflammation   Rosacea   GERD (gastroesophageal reflux disease)   Insomnia   Joint pain   Parkinsonism   Atopic dermatitis   Elevated bilirubin   1-Acute cholecystitis status post subtotal Cholecystectomy Concern for bile leak -Patient presented with abdominal pain,  ultrasound consistent with cholelithiasis and positive Murphy sign, MRCP with numerous  gallstone,  -Patient underwent subtotal cholecystectomy by Dr. Cliffton Asters on 8/2 -Repeated CT abdomen and pelvis 8/5 noted evacuation of gallbladder with surgical drain in place, numerous calcified gallstone within the gallbladder fossa, no loculated fluid collection -Completed  IV Zosyn 8/07. -Pain management -Underwent ERCP and stent placement due to concern for bile leak 8/6 -Further management per general surgery -Drain out put decreasing. 40 cc last 24 hours.  He will need HH. He needs to learn drain care.  Home tomorrow.   2-Elevated bilirubin secondary to acute cholecystitis Trending down.   3-Essential Hypertension: Continue with  losartan Holding furosemide. Improved on  Norvasc.   Hyperlipidemia: Holding atorvastatin due to transaminases  Diabetes Type II: Continue to hold home regimen metformin and glipizide Continue with sliding scale insulin  Parkinson's disease: Continue with Sinemet buspirone Requip  BPH -- Tamsulosin 0.8 mg p.o. daily   OSA -- Nocturnal CPAP   Morbid obesity Body mass index is 43.51 kg/m.  Discussed with patient needs for aggressive lifestyle changes/weight loss as this complicates all facets of care.  Outpatient follow-up with PCP.      Estimated body mass index is 43.51 kg/m as calculated from the following:   Height as of this encounter: 5\' 5"  (1.651 m).   Weight as of this encounter: 118.6 kg.   DVT prophylaxis: SCD, lovenox Code Status: Full code Family Communication: Disposition Plan:  Status is: Inpatient Remains inpatient appropriate because: drain education, continue to work on mobility home tomorrow. CM consulted for [facility] and resource for private care.     Consultants:  Surgery  GI  Procedures:  Cholecystectomy   Antimicrobials:    Subjective: He was able to go to bathroom  by himself last night.  Report abdominal pain is stable. Not  worse. Had BM   Objective: Vitals:   03/22/23 0538 03/22/23 1417 03/22/23 2133 03/23/23 0523  BP: (!) 166/86 136/73 (!) 151/77 (!) 163/86  Pulse:  (!) 54 (!) 56 (!) 57  Resp: 18 18 18 18   Temp: 97.7 F (36.5 C) 98 F (36.7 C) 97.8 F (36.6 C) (!) 97.5 F (36.4 C)  TempSrc: Oral Oral Oral Oral  SpO2: 96% 94% 94% 98%  Weight:      Height:        Intake/Output Summary (Last 24 hours) at 03/23/2023 1101 Last data filed at 03/23/2023 1610 Gross per 24 hour  Intake 360 ml  Output 1305 ml  Net -945 ml   Filed Weights   03/12/23 0738 03/14/23 1328 03/18/23 1225  Weight: 118.6 kg 118.6 kg 118.6 kg    Examination:  General exam: NAD Respiratory system: CTA Cardiovascular system: S 1, S 2 RRR Gastrointestinal system: BS present, soft, , nt Drain in placed.  Central nervous system: Alert, follows command Extremities: no edema Data Reviewed: I have personally reviewed following labs and imaging studies  CBC: Recent Labs  Lab 03/18/23 0430 03/19/23 0424 03/21/23 0432 03/23/23 0424  WBC 6.2 7.4 5.6 5.2  HGB 13.0 14.7 12.7* 13.5  HCT 40.5 44.6 40.4 41.5  MCV 91.2 91.4 91.4 90.8  PLT 210 246 232 257   Basic Metabolic Panel: Recent Labs  Lab 03/18/23 0430 03/19/23 0424 03/20/23 0500 03/21/23 0432 03/22/23 0451 03/23/23 0424  NA 137 137 138 135 139  --   K 3.8 4.5 3.4* 3.7 3.4* 3.9  CL 102 102 101 102 106  --   CO2 25 25 28 24 24   --   GLUCOSE 106* 121* 99 123* 127*  --   BUN 10 13 13 10 9   --   CREATININE 0.72 0.89 1.00 0.91 0.81  --   CALCIUM 8.7* 9.2 8.8* 8.2* 8.4*  --   MG  --   --   --   --  2.1  --   PHOS  --   --   --   --  2.8  --    GFR: Estimated Creatinine Clearance: 101.2 mL/min (by C-G formula based on SCr of 0.81 mg/dL). Liver Function Tests: Recent Labs  Lab 03/17/23 0430 03/18/23 0430 03/19/23 0424 03/20/23 0500 03/22/23 0451  AST 33 27 27 26 21   ALT 43 29 35 32 36  ALKPHOS 39 36* 44 42 45  BILITOT 1.4* 1.5* 1.4* 1.6* 0.6  PROT 6.3*  6.0* 6.7 6.1* 6.0*  ALBUMIN 3.0* 2.9* 3.1* 2.9* 2.8*   Recent Labs  Lab 03/19/23 0424 03/22/23 0451  LIPASE 24 24   No results for input(s): "AMMONIA" in the last 168 hours. Coagulation Profile: No results for input(s): "INR", "PROTIME" in the last 168 hours. Cardiac Enzymes: No results for input(s): "CKTOTAL", "CKMB", "CKMBINDEX", "TROPONINI" in the last 168 hours.  BNP (last 3 results) No results for input(s): "PROBNP" in the last 8760 hours. HbA1C: No results for input(s): "HGBA1C" in the last 72 hours. CBG: Recent Labs  Lab 03/22/23 0756 03/22/23 1131 03/22/23 1627 03/22/23 2134 03/23/23 0712  GLUCAP 117* 147* 125* 162* 127*   Lipid Profile: No results for input(s): "CHOL", "HDL", "LDLCALC", "TRIG", "CHOLHDL", "LDLDIRECT" in the last 72 hours. Thyroid Function Tests: No results for input(s): "TSH", "T4TOTAL", "FREET4", "T3FREE", "THYROIDAB" in the last 72 hours. Anemia Panel: No results for input(s): "VITAMINB12", "FOLATE", "  FERRITIN", "TIBC", "IRON", "RETICCTPCT" in the last 72 hours. Sepsis Labs: No results for input(s): "PROCALCITON", "LATICACIDVEN" in the last 168 hours.   Recent Results (from the past 240 hour(s))  MRSA Next Gen by PCR, Nasal     Status: None   Collection Time: 03/14/23 10:46 AM   Specimen: Nasal Mucosa; Nasal Swab  Result Value Ref Range Status   MRSA by PCR Next Gen NOT DETECTED NOT DETECTED Final    Comment: (NOTE) The GeneXpert MRSA Assay (FDA approved for NASAL specimens only), is one component of a comprehensive MRSA colonization surveillance program. It is not intended to diagnose MRSA infection nor to guide or monitor treatment for MRSA infections. Test performance is not FDA approved in patients less than 33 years old. Performed at [facility] [address], [place], [place] [address]          Radiology Studies: No results found.      Scheduled Meds:  acetaminophen  1,000 mg Oral TID    amLODipine  5 mg Oral Daily   busPIRone  5 mg Oral TID   Carbidopa-Levodopa ER  1 tablet Oral Daily   cholecalciferol  2,000 Units Oral Daily   enoxaparin (LOVENOX) injection  60 mg Subcutaneous Q24H   insulin aspart  0-9 Units Subcutaneous TID AC & HS   lidocaine  1 patch Transdermal Q24H   linaclotide  145 mcg Oral Daily   losartan  50 mg Oral Daily   multivitamin with minerals  1 tablet Oral Daily   polyethylene glycol  17 g Oral BID   rOPINIRole  2 mg Oral TID   tamsulosin  0.8 mg Oral Daily   Continuous Infusions:  sodium chloride 10 mL/hr at 03/18/23 1921     LOS: 11 days    Time spent: 35 minutes    Harlan Vinal A Tysheem Accardo, MD [facility]   If 7PM-7AM, please contact night-coverage [email or web address]  03/23/2023, 11:01 AM

## 2023-03-23 NOTE — Progress Notes (Signed)
Physical Therapy Treatment Patient Details Name: Jonathan Yu MRN: [id number] DOB: 02-01-1951 Today's Date: 03/23/2023   History of Present Illness Jonathan Yu is a 72 y.o. male presented with abd pain;CTabd/pelvis - Findings highly concerning for acute cholecystitis.7/2 pt s/p laparoscopic subtotal cholecystectomy. Pt s/p ERCP and stent 8/6. PHMx: HLD, HTN OSA, DM2 , parkinson disease, hypogonadism.    PT Comments  Pt in good spirits and motivated to mobilize this am.  Pt up to ambulate halls sans AD and with good stability noted despite intermittent Parkinsons type gait.  Pt states hopes to dc home to family in am.    If plan is discharge home, recommend the following: Assist for transportation;Help with stairs or ramp for entrance;A little help with walking and/or transfers;A little help with bathing/dressing/bathroom;Assistance with cooking/housework   Can travel by private vehicle        Equipment Recommendations  None recommended by PT    Recommendations for Other Services       Precautions / Restrictions Precautions Precautions: Fall Precaution Comments: Right JP drain Restrictions Weight Bearing Restrictions: No     Mobility  Bed Mobility               General bed mobility comments: Pt up in chair and requests back to same    Transfers Overall transfer level: Needs assistance Equipment used: None Transfers: Sit to/from Stand Sit to Stand: Supervision           General transfer comment: pt using BUE to assist in powering to stand, good steadiness and no AD needed, denies pain, no LOB posterior    Ambulation/Gait Ambulation/Gait assistance: Supervision Gait Distance (Feet): 400 Feet Assistive device: None Gait Pattern/deviations: Step-to pattern, Step-through pattern, Decreased step length - right, Decreased step length - left, Shuffle, Wide base of support Gait velocity: decreased     General Gait Details: Intermittent Parkinson's gait with  short choppy steps, cues for increasing step length and foot clearance, good stability with turns and navigating around obstacles, no overt LOB   Stairs             Wheelchair Mobility     Tilt Bed    Modified Rankin (Stroke Patients Only)       Balance Overall balance assessment: Needs assistance Sitting-balance support: Bilateral upper extremity supported, Feet supported Sitting balance-Leahy Scale: Good     Standing balance support: During functional activity Standing balance-Leahy Scale: Good Standing balance comment: donning second gown in standing                            Cognition Arousal: Alert Behavior During Therapy: WFL for tasks assessed/performed Overall Cognitive Status: Within Functional Limits for tasks assessed                                 General Comments: Good spirits and eager to mobilize        Exercises      General Comments        Pertinent Vitals/Pain Pain Assessment Pain Assessment: No/denies pain    Home Living                          Prior Function            PT Goals (current goals can now be found in the care plan section) Acute Rehab PT Goals  PT Goal Formulation: With patient Time For Goal Achievement: 03/30/23 Potential to Achieve Goals: Good Progress towards PT goals: Progressing toward goals    Frequency    Min 1X/week      PT Plan      Co-evaluation              AM-PAC PT "6 Clicks" Mobility   Outcome Measure  Help needed turning from your back to your side while in a flat bed without using bedrails?: A Lot Help needed moving from lying on your back to sitting on the side of a flat bed without using bedrails?: A Lot Help needed moving to and from a bed to a chair (including a wheelchair)?: A Little Help needed standing up from a chair using your arms (e.g., wheelchair or bedside chair)?: A Little Help needed to walk in hospital room?: A Little Help  needed climbing 3-5 steps with a railing? : A Little 6 Click Score: 16    End of Session Equipment Utilized During Treatment: Gait belt Activity Tolerance: Patient tolerated treatment well Patient left: in chair;with call bell/phone within reach Nurse Communication: Mobility status PT Visit Diagnosis: Other abnormalities of gait and mobility (R26.89);Difficulty in walking, not elsewhere classified (R26.2)     Time: 0254-2706 PT Time Calculation (min) (ACUTE ONLY): 22 min  Charges:    $Gait Training: 8-22 mins PT General Charges $$ ACUTE PT VISIT: 1 Visit                     Mauro Kaufmann PT Acute Rehabilitation Services Pager [phone] Office [phone]    [facility] Hill[facility] 03/23/2023, 12:27 PM

## 2023-03-23 NOTE — Progress Notes (Signed)
   03/23/23 2300  BiPAP/CPAP/SIPAP  BiPAP/CPAP/SIPAP Pt Type Adult (Placed on independently)  BiPAP/CPAP/SIPAP DREAMSTATIOND  Mask Type Nasal mask (Pts. own mask)  Mask Size Medium  Respiratory Rate 18 breaths/min  FiO2 (%) 21 %  Flow Rate 0 lpm  Patient Home Equipment Yes (Nasal Mask)  Auto Titrate Yes (<5/>20)  CPAP/SIPAP surface wiped down Yes   Pt. seen on CPAP/BiPAP rounds, tolerating well, made aware to notify if needed.

## 2023-03-23 NOTE — Progress Notes (Signed)
Reviewed drain care with pt and all questions answered. Demonstrated applying split gauze to JP site and securing with tape. Teaching given on emptying and recharging JP drain. Pt verbalized understanding but unsure if he is confident completely care independently. Pt stated family will help and may need a demonstration or teaching to ensure understanding prior to d/c.

## 2023-03-23 NOTE — Plan of Care (Signed)
  Problem: Coping: Goal: Level of anxiety will decrease Outcome: Progressing   Problem: Education: Goal: Knowledge of General Education information will improve Description: Including pain rating scale, medication(s)/side effects and non-pharmacologic comfort measures Outcome: Adequate for Discharge   Problem: Health Behavior/Discharge Planning: Goal: Ability to manage health-related needs will improve Outcome: Adequate for Discharge   Problem: Clinical Measurements: Goal: Ability to maintain clinical measurements within normal limits will improve Outcome: Adequate for Discharge   Problem: Activity: Goal: Risk for activity intolerance will decrease Outcome: Adequate for Discharge   Problem: Nutrition: Goal: Adequate nutrition will be maintained Outcome: Adequate for Discharge

## 2023-03-23 NOTE — Progress Notes (Signed)
03/23/2023  Jonathan Yu [id number] 1951/02/20  CARE TEAM: PCP: Everrett Coombe, DO  Outpatient Care Team: Patient Care Team: Everrett Coombe, DO as PCP - General (Family Medicine) Odella Aquas, MD as Referring Physician (Urology) Nadara Mustard, MD as Consulting Physician (Orthopedic Surgery)  Inpatient Treatment Team: Treatment Team:  Alba Cory, MD [facility], Md, MD Lilyan Gilford, MD Josph Macho, RN Brien Few, PT   Problem List:   Principal Problem:   Acute cholecystitis Active Problems:   Essential hypertension, benign   Dyslipidemia   Type 2 diabetes mellitus without complication, without long-term current use of insulin (HCC)   OSA on CPAP   Right lumbar radiculopathy   BPH (benign prostatic hyperplasia)   Idiopathic chronic venous hypertension of both lower extremities with inflammation   Rosacea   GERD (gastroesophageal reflux disease)   Insomnia   Joint pain   Parkinsonism   Atopic dermatitis   Elevated bilirubin   03/14/2023  POST-OPERATIVE DIAGNOSIS: Acute on chronic gangrenous cholecystitis   PROCEDURE: Laparoscopic subtotal cholecystectomy   SURGEON: Marin Olp, MD  FINDINGS: Acute on chronic gangrenous cholecystitis.  Dense fibrotic type tissue over much of the wall of the gallbladder including all the way down near the infundibulum.  It was felt that attempts at dissection in this region would carry an increased risk for injury to surrounding structures given the difficulty with which we encountered in attempts at dissection.  Therefore, we opted to proceed with a subtotal cholecystectomy.  The anterior wall of the gallbladder was removed and the posterior wall was fulgurated.  A 19 French round Blake drain was left at conclusion.    03/18/2023  Procedure:               ERCP  Indications:              Treatment of bile leak  Providers:                Vida Rigger, MD  Assessment [facility] Stay = 11 days) 5 Days Post-Op     Gradually recovering.   Plan:  Acute gangrenous on top of chronic cholecystitis s/p Laparoscopic subtotal cholecystectomy for by Dr. Cliffton Asters on 03/14/23 Completed post op abx. (Zosyn x 5 days) CT 8/5 w/ no loculated perihepatic or intra-abdominal fluid collections.  Afebrile.  WBC wnl.   CT w/ residual calcified gallstones.  No need for acute surgical intervention at this time.  Could potentially benefit from completion cholecystectomy in 6-8 weeks.  Would need to assess recovery and risks first.  Defer to operating surgeon, Dr. Cliffton Asters.   Delayed bile leak  Not surprising given high risk with gangrenous cholecystitis amenable only to subtotal cholecystectomy  Internal drainage by stenting ERCP 8/6.  Defer to Gastroenterology on appropriateness of removal.  Assume that will happen in 6-8 weeks once leak has been resolved.  External drainage with Blake drain in gallbladder hepatic fossa.   Drain output still bilious but downtrending. Monitor.  Patient will need to go home with this with close outpatient follow-up.  Patient seems overwhelmed about the idea of managing himself so we will see if home health can be set up for him.  Defer to primary service on if he needs to be at a SNF instead.  Would like to see this continuing to downtrend and clearing up before d/c.  Hopefully can be removed as an outpatient.  Deferred need for bile leak HIDA scan repeat before removing.  - Mobilize - therapies  -  Pulm toilet    FEN -advance to heart healthy diet soft. Cont bowel regimen. IVF per [facility] VTE - SCDs, Lovenox  ID - Zosyn completed post op. None currently.   -Disposition: Discussed with Dr. Sunnie Nielsen with [facility] primary hospital service.  We both suspect patient could go home soon.  Most likely on Monday when home health and family arrangements can be better made.  Gives an opportunity for him to continue improve with therapies and get better training on drain management etc.  Disposition:  The  patient is from: Home Anticipate discharge to:   Home health versus skilled nursing facility Anticipated Date of Discharge is: To be determined.  Hopefully as soon as Monday 8/12 Barriers to discharge:  Consultant clearance & sign off  , Transitions of Care, and Pending Clinical improvement (more likely than not)  Patient currently is NOT MEDICALLY STABLE for discharge from the hospital from a surgery standpoint.      I reviewed nursing notes, hospitalist notes, last 24 h vitals and pain scores, last 48 h intake and output, last 24 h labs and trends, and last 24 h imaging results.  I have reviewed this patient's available data, including medical history, events of note, test results, etc as part of my evaluation.   A significant portion of that time was spent in counseling. Care during the described time interval was provided by me.  This care required moderate level of medical decision making.  03/23/2023    Subjective: (Chief complaint)  No major events.  Tolerance diet.  Trying to figure out planning with home health therapies and care  Objective:  Vital signs:  Vitals:   03/22/23 0538 03/22/23 1417 03/22/23 2133 03/23/23 0523  BP: (!) 166/86 136/73 (!) 151/77 (!) 163/86  Pulse:  (!) 54 (!) 56 (!) 57  Resp: 18 18 18 18   Temp: 97.7 F (36.5 C) 98 F (36.7 C) 97.8 F (36.6 C) (!) 97.5 F (36.4 C)  TempSrc: Oral Oral Oral Oral  SpO2: 96% 94% 94% 98%  Weight:      Height:        Last BM Date : 03/21/23  Intake/Output   Yesterday:  08/10 0701 - 08/11 0700 In: 480 [P.O.:480] Out: 1390 [Urine:1350; Drains:40] This shift:  No intake/output data recorded.  Bowel function:  Flatus: YES  BM:  No  Drain: Bilious   Physical Exam:  General: Pt sleeping comfortably and easily awakens to be alert in no acute distress Eyes: PERRL, normal EOM.  Sclera clear.  No icterus Neuro: CN II-XII intact w/o focal sensory/motor deficits. Lymph: No head/neck/groin  lymphadenopathy Psych:  No delerium/psychosis/paranoia.  Oriented x 4.  Mild masked face but seems to be oriented and interacting.  Asked appropriate questions HENT: Normocephalic, Mucus membranes moist.  No thrush Neck: Supple, No tracheal deviation.  No obvious thyromegaly Chest: No pain to chest wall compression.  Good respiratory excursion.  No audible wheezing CV:  Pulses intact.  Regular rhythm.  No major extremity edema MS: Normal AROM mjr joints.  No obvious deformity  Abdomen: Soft.  Obese Nondistended.  Mildly tender at incisions only.  No evidence of peritonitis.  No incarcerated hernias.  Ext:  No deformity.  2+ edema.  No cyanosis Skin: No petechiae / purpurea.  No major sores.  Warm and dry    Results:   Cultures: Recent Results (from the past 720 hour(s))  Culture, blood (Routine X 2) w Reflex to ID Panel     Status: None  Collection Time: 03/13/23 12:15 AM   Specimen: BLOOD  Result Value Ref Range Status   Specimen Description   Final    BLOOD BLOOD LEFT HAND Performed at [facility], [address], [place], [place] [address]    Special Requests   Final    BOTTLES DRAWN AEROBIC AND ANAEROBIC Blood Culture adequate volume Performed at [facility], [address], [place], [place] [address]    Culture   Final    NO GROWTH 5 DAYS Performed at [facility], [address] [place], [place] [address]    Report Status 03/18/2023 FINAL  Final  Culture, blood (Routine X 2) w Reflex to ID Panel     Status: None   Collection Time: 03/13/23 12:22 AM   Specimen: BLOOD  Result Value Ref Range Status   Specimen Description   Final    BLOOD BLOOD RIGHT HAND Performed at [facility], [address] [place], [place] [address]    Special Requests   Final    BOTTLES DRAWN AEROBIC AND ANAEROBIC Blood Culture adequate volume Performed at [facility], [address] [place], [place]  [address]    Culture   Final    NO GROWTH 5 DAYS Performed at [facility], [address], [place], [place] [address]    Report Status 03/18/2023 FINAL  Final  MRSA Next Gen by PCR, Nasal     Status: None   Collection Time: 03/14/23 10:46 AM   Specimen: Nasal Mucosa; Nasal Swab  Result Value Ref Range Status   MRSA by PCR Next Gen NOT DETECTED NOT DETECTED Final    Comment: (NOTE) The GeneXpert MRSA Assay (FDA approved for NASAL specimens only), is one component of a comprehensive MRSA colonization surveillance program. It is not intended to diagnose MRSA infection nor to guide or monitor treatment for MRSA infections. Test performance is not FDA approved in patients less than 38 years old. Performed at [facility], [address] [place], [place] [address]     Labs: Results for orders placed or performed during the hospital encounter of 03/12/23 (from the past 48 hour(s))  Glucose, capillary     Status: Abnormal   Collection Time: 03/21/23  7:31 AM  Result Value Ref Range   Glucose-Capillary 112 (H) 70 - 99 mg/dL    Comment: Glucose reference range applies only to samples taken after fasting for at least 8 hours.  Glucose, capillary     Status: Abnormal   Collection Time: 03/21/23 11:38 AM  Result Value Ref Range   Glucose-Capillary 174 (H) 70 - 99 mg/dL    Comment: Glucose reference range applies only to samples taken after fasting for at least 8 hours.  Glucose, capillary     Status: Abnormal   Collection Time: 03/21/23  4:24 PM  Result Value Ref Range   Glucose-Capillary 187 (H) 70 - 99 mg/dL    Comment: Glucose reference range applies only to samples taken after fasting for at least 8 hours.  Glucose, capillary     Status: Abnormal   Collection Time: 03/21/23  9:23 PM  Result Value Ref Range   Glucose-Capillary 215 (H) 70 - 99 mg/dL    Comment: Glucose reference range applies only to samples taken after fasting for at least 8 hours.  Basic  metabolic panel     Status: Abnormal   Collection Time: 03/22/23  4:51 AM  Result Value Ref Range  Sodium 139 135 - 145 mmol/L   Potassium 3.4 (L) 3.5 - 5.1 mmol/L   Chloride 106 98 - 111 mmol/L   CO2 24 22 - 32 mmol/L   Glucose, Bld 127 (H) 70 - 99 mg/dL    Comment: Glucose reference range applies only to samples taken after fasting for at least 8 hours.   BUN 9 8 - 23 mg/dL   Creatinine, Ser 4.09 0.61 - 1.24 mg/dL   Calcium 8.4 (L) 8.9 - 10.3 mg/dL   GFR, Estimated >81 >19 mL/min    Comment: (NOTE) Calculated using the CKD-EPI Creatinine Equation (2021)    Anion gap 9 5 - 15    Comment: Performed at [facility], [address] [place], [place] [address]  Magnesium     Status: None   Collection Time: 03/22/23  4:51 AM  Result Value Ref Range   Magnesium 2.1 1.7 - 2.4 mg/dL    Comment: Performed at [facility], [address] [place], [place] [address]  Phosphorus     Status: None   Collection Time: 03/22/23  4:51 AM  Result Value Ref Range   Phosphorus 2.8 2.5 - 4.6 mg/dL    Comment: Performed at [facility], [address], [place], [place] [address]  Lipase, blood     Status: None   Collection Time: 03/22/23  4:51 AM  Result Value Ref Range   Lipase 24 11 - 51 U/L    Comment: Performed at [facility], [address] [place], [place] [address]  Hepatic function panel     Status: Abnormal   Collection Time: 03/22/23  4:51 AM  Result Value Ref Range   Total Protein 6.0 (L) 6.5 - 8.1 g/dL   Albumin 2.8 (L) 3.5 - 5.0 g/dL   AST 21 15 - 41 U/L   ALT 36 0 - 44 U/L   Alkaline Phosphatase 45 38 - 126 U/L   Total Bilirubin 0.6 0.3 - 1.2 mg/dL   Bilirubin, Direct 0.2 0.0 - 0.2 mg/dL   Indirect Bilirubin 0.4 0.3 - 0.9 mg/dL    Comment: Performed at [facility], [address] [place], [place] [address]  Glucose, capillary     Status: Abnormal   Collection Time: 03/22/23  7:56  AM  Result Value Ref Range   Glucose-Capillary 117 (H) 70 - 99 mg/dL    Comment: Glucose reference range applies only to samples taken after fasting for at least 8 hours.  Glucose, capillary     Status: Abnormal   Collection Time: 03/22/23 11:31 AM  Result Value Ref Range   Glucose-Capillary 147 (H) 70 - 99 mg/dL    Comment: Glucose reference range applies only to samples taken after fasting for at least 8 hours.  Prealbumin     Status: None   Collection Time: 03/22/23  1:25 PM  Result Value Ref Range   Prealbumin 27 18 - 38 mg/dL    Comment: Performed at [facility], [address], [place], [place] [address]  Glucose, capillary     Status: Abnormal   Collection Time: 03/22/23  4:27 PM  Result Value Ref Range   Glucose-Capillary 125 (H) 70 - 99 mg/dL    Comment: Glucose reference range applies only to samples taken after fasting for at least 8 hours.  Glucose, capillary     Status: Abnormal   Collection Time: 03/22/23  9:34 PM  Result Value Ref Range  Glucose-Capillary 162 (H) 70 - 99 mg/dL    Comment: Glucose reference range applies only to samples taken after fasting for at least 8 hours.  CBC     Status: None   Collection Time: 03/23/23  4:24 AM  Result Value Ref Range   WBC 5.2 4.0 - 10.5 K/uL   RBC 4.57 4.22 - 5.81 MIL/uL   Hemoglobin 13.5 13.0 - 17.0 g/dL   HCT 16.1 09.6 - 04.5 %   MCV 90.8 80.0 - 100.0 fL   MCH 29.5 26.0 - 34.0 pg   MCHC 32.5 30.0 - 36.0 g/dL   RDW 40.9 81.1 - 91.4 %   Platelets 257 150 - 400 K/uL   nRBC 0.0 0.0 - 0.2 %    Comment: Performed at [facility], [address], [place], [place] [address]  Potassium     Status: None   Collection Time: 03/23/23  4:24 AM  Result Value Ref Range   Potassium 3.9 3.5 - 5.1 mmol/L    Comment: Performed at [facility], [address], [place], [place] [address]    Imaging / Studies: No results found.  Medications / Allergies: per  chart  Antibiotics: Anti-infectives (From admission, onward)    Start     Dose/Rate Route Frequency Ordered Stop   03/19/23 1200  piperacillin-tazobactam (ZOSYN) IVPB 3.375 g        3.375 g 12.5 mL/hr over 240 Minutes Intravenous Every 8 hours 03/19/23 0822 03/20/23 0816   03/12/23 2000  piperacillin-tazobactam (ZOSYN) IVPB 3.375 g  Status:  Discontinued        3.375 g 12.5 mL/hr over 240 Minutes Intravenous Every 8 hours 03/12/23 1230 03/19/23 0822   03/12/23 1245  piperacillin-tazobactam (ZOSYN) IVPB 3.375 g        3.375 g 100 mL/hr over 30 Minutes Intravenous  Once 03/12/23 1230 03/12/23 1325         Note: Portions of this report may have been transcribed using voice recognition software. Every effort was made to ensure accuracy; however, inadvertent computerized transcription errors may be present.   Any transcriptional errors that result from this process are unintentional.    Ardeth Sportsman, MD, FACS, MASCRS Esophageal, Gastrointestinal & Colorectal Surgery Robotic and Minimally Invasive Surgery  [facility] [address] [place], [place] [address] [phone] Fax [phone] Main  CONTACT INFORMATION: Weekday (9AM-5PM): Call [facility] main office at [phone] Weeknight (5PM-9AM) or Weekend/Holiday: Check EPIC "Web Links" tab & use "AMION" (password " [email or web address]") for General Surgery [facility] coverage  Please, DO NOT use SecureChat  (it is not reliable communication to reach operating surgeons & will lead to a delay in care).   Epic staff messaging available for outptient concerns needing 1-2 business day response.      03/23/2023  7:09 AM

## 2023-03-24 DIAGNOSIS — K810 Acute cholecystitis: Secondary | ICD-10-CM | POA: Diagnosis not present

## 2023-03-24 LAB — BASIC METABOLIC PANEL
Anion gap: 8 (ref 5–15)
BUN: 11 mg/dL (ref 8–23)
CO2: 29 mmol/L (ref 22–32)
Calcium: 9.1 mg/dL (ref 8.9–10.3)
Chloride: 101 mmol/L (ref 98–111)
Creatinine, Ser: 0.86 mg/dL (ref 0.61–1.24)
GFR, Estimated: >60 mL/min (ref >60)
Glucose, Bld: 103 mg/dL — ABNORMAL HIGH (ref 70–99)
Potassium: 4.5 mmol/L (ref 3.5–5.1)
Sodium: 138 mmol/L (ref 135–145)

## 2023-03-24 LAB — GLUCOSE, CAPILLARY: Glucose-Capillary: 103 mg/dL — ABNORMAL HIGH (ref 70–99)

## 2023-03-24 MED ORDER — AMLODIPINE BESYLATE 5 MG PO TABS: 5.0000 mg | ORAL_TABLET | Freq: Every day | ORAL | 0 refills | Status: DC

## 2023-03-24 MED ORDER — TRAMADOL HCL 50 MG PO TABS
ORAL_TABLET | ORAL | 0 refills | Status: DC
Start: 2023-03-24 — End: 2023-05-21

## 2023-03-24 MED ORDER — ACETAMINOPHEN 500 MG PO TABS: 500.0000 mg | ORAL_TABLET | Freq: Four times a day (QID) | ORAL | 0 refills | Status: DC | PRN

## 2023-03-24 MED ORDER — ONDANSETRON HCL 4 MG PO TABS: 4.0000 mg | ORAL_TABLET | Freq: Four times a day (QID) | ORAL | 0 refills | Status: DC | PRN

## 2023-03-24 NOTE — Discharge Summary (Signed)
Physician Discharge Summary   Patient: Jonathan Yu MRN: [id number] DOB: 1950/08/28  Admit date:     03/12/2023  Discharge date: 03/24/23  Discharge Physician: Alba Cory   PCP: Everrett Coombe, DO   Recommendations at discharge:   Needs to follow up with Surgery on 03/31/2023 for drain removal.  Needs labs to follow LFT   Discharge Diagnoses: Principal Problem:   Acute cholecystitis Active Problems:   Essential hypertension, benign   Dyslipidemia   Type 2 diabetes mellitus without complication, without long-term current use of insulin (HCC)   OSA on CPAP   Right lumbar radiculopathy   BPH (benign prostatic hyperplasia)   Idiopathic chronic venous hypertension of both lower extremities with inflammation   Rosacea   GERD (gastroesophageal reflux disease)   Insomnia   Joint pain   Parkinsonism   Atopic dermatitis   Elevated bilirubin  Resolved Problems:   * No resolved hospital problems. *  Hospital Course: 72 year old with past medical history significant for hypertension, hyperlipidemia, diabetes type 2, OSA, Parkinson disease, hypogonadism who presents to ED complaining of abdominal pain. On further  evaluation he was found to be febrile temperature 101, lactic acid 1.2, white blood cell 7.6.  CT abdomen and pelvis with finding consistent with acute cholecystitis.  Ultrasound abdomen showed cholelithiasis with positive graphic sign.  Surgery was consulted, patient underwent MRCP that showed numerous small gallstones and sludge  within the gallbladder, including near the gallbladder neck and gallbladder wall thickening.  finding consistent with acute cholecystitis.  No biliary ductal dilation or choledocholithiasis.   Patient underwent subtotal laparoscopic cholecystectomy on 03/14/2023 by Dr. Cliffton Asters with drain placement.  CT abdomen and pelvis 8/5 noted evacuation of the gallbladder with surgical drain noted, numerous calcified gallstone within the gallbladder fossa no  loculated perihepatic or intra abdominal fluid collection. GI was consulted and patient underwent ERCP with stent placement for concern of bile leak on 8/6  Assessment and Plan: 1-Acute cholecystitis status post subtotal Cholecystectomy Concern for bile leak -Patient presented with abdominal pain, ultrasound consistent with cholelithiasis and positive Murphy sign, MRCP with numerous  gallstone,  -Patient underwent subtotal cholecystectomy by Dr. Cliffton Asters on 8/2 -Repeated CT abdomen and pelvis 8/5 noted evacuation of gallbladder with surgical drain in place, numerous calcified gallstone within the gallbladder fossa, no loculated fluid collection -Completed  IV Zosyn 8/07. -Pain management -Underwent ERCP and stent placement due to concern for bile leak 8/6 -Further management per general surgery -Drain out put decreasing. 40 cc last 24 hours.  He will need HH. Home today. Stable for discharge.    2-Elevated bilirubin secondary to acute cholecystitis Trending down.    3-Essential Hypertension: Continue with  losartan Holding furosemide. Improved on  Norvasc.    Hyperlipidemia: Holding atorvastatin due to transaminases   Diabetes Type II: Continue to hold home regimen metformin and glipizide Continue with sliding scale insulin   Parkinson's disease: Continue with Sinemet buspirone Requip   BPH -- Tamsulosin 0.8 mg p.o. daily   OSA -- Nocturnal CPAP   Morbid obesity Body mass index is 43.51 kg/m.  Discussed with patient needs for aggressive lifestyle changes/weight loss as this complicates all facets of care.  Outpatient follow-up with PCP.       Estimated body mass index is 43.51 kg/m as calculated from the following:   Height as of this encounter: 5\' 5"  (1.651 m).   Weight as of this encounter: 118.6 kg.        Consultants: Surgery  Procedures performed:  Partial cholecystectomy Disposition: Home Diet recommendation:  Discharge Diet Orders (From admission, onward)      Start     Ordered   03/24/23 0000  Diet - low sodium heart healthy        03/24/23 1001           Cardiac diet DISCHARGE MEDICATION: Allergies as of 03/24/2023       Reactions   Sesame Oil Hives, Shortness Of Breath   Clonazepam Hives   Tolerates Ativan   Tape Other (See Comments)   White surgical tape caused some blisters.   Percocet [oxycodone-acetaminophen] Rash   Topamax [topiramate] Other (See Comments)   Cognitive decline   Vicodin [hydrocodone-acetaminophen] Rash        Medication List     STOP taking these medications    benzonatate 200 MG capsule Commonly known as: TESSALON   tadalafil 20 MG tablet Commonly known as: CIALIS       TAKE these medications    acetaminophen 500 MG tablet Commonly known as: TYLENOL Take 1 tablet (500 mg total) by mouth every 6 (six) hours as needed.   albuterol 108 (90 Base) MCG/ACT inhaler Commonly known as: VENTOLIN HFA Inhale 2 puffs into the lungs every 6 (six) hours as needed for wheezing or shortness of breath.   AMBULATORY NON FORMULARY MEDICATION Continue CPAP at current settings.  Please provide new supplies. [name] phone number [phone]   AMBULATORY NON FORMULARY MEDICATION Compression stockings.  Vive size LGM Leg swelling. Disp 1 pair 99 refil.   amLODipine 5 MG tablet Commonly known as: NORVASC Take 1 tablet (5 mg total) by mouth daily. Start taking on: March 25, 2023   anastrozole 1 MG tablet Commonly known as: ARIMIDEX Take 1 tablet (1 mg total) by mouth every Monday, Wednesday, and Friday.   atorvastatin 40 MG tablet Commonly known as: LIPITOR TAKE 1 TABLET EVERY DAY   Azelaic Acid 15 % gel Apply 1 application topically 2 (two) times daily. What changed:  when to take this reasons to take this   busPIRone 5 MG tablet Commonly known as: BUSPAR Take 1 tablet (5 mg total) by mouth 4 (four) times daily as needed. What changed: when to take this   Carbidopa-Levodopa ER  25-100 MG tablet controlled release Commonly known as: SINEMET CR Take 1 tablet by mouth daily. Dose will increase by 1 tablet each week until taking 3 times daily   Flex Therapy Misc by Does not apply route.   fluticasone 50 MCG/ACT nasal spray Commonly known as: FLONASE Place 1 spray into both nostrils as needed for allergies.   furosemide 20 MG tablet Commonly known as: LASIX TAKE 1 TABLET BY MOUTH ONCE DAILY AS NEEDED FOR EDEMA What changed: See the new instructions.   glipiZIDE 5 MG tablet Commonly known as: GLUCOTROL Take 1 tablet (5 mg total) by mouth 2 (two) times daily before a meal.   Linzess 145 MCG Caps capsule Generic drug: linaclotide Take 145 mcg by mouth daily.   loratadine 10 MG tablet Commonly known as: CLARITIN Take 10 mg by mouth daily as needed for allergies.   losartan 50 MG tablet Commonly known as: COZAAR TAKE 1 TABLET EVERY DAY   metFORMIN 500 MG 24 hr tablet Commonly known as: GLUCOPHAGE-XR Take 1 tablet (500 mg total) by mouth 2 (two) times daily with a meal.   mometasone 0.1 % cream Commonly known as: ELOCON Apply 1 Application topically daily.   multivitamin with minerals Tabs tablet Take  1 tablet by mouth daily.   ondansetron 4 MG tablet Commonly known as: ZOFRAN Take 1 tablet (4 mg total) by mouth every 6 (six) hours as needed for nausea.   PROBIOTIC PO Take 2 capsules by mouth daily.   rOPINIRole 2 MG tablet Commonly known as: REQUIP TAKE 1 TABLET THREE TIMES DAILY   tamsulosin 0.4 MG Caps capsule Commonly known as: FLOMAX Take 2 capsules (0.8 mg total) by mouth daily.   traMADol 50 MG tablet Commonly known as: ULTRAM TAKE 1 TABLET BY MOUTH EVERY 8 HOURS AS NEEDED FOR MODERATE PAIN What changed:  how much to take how to take this when to take this reasons to take this additional instructions   triamcinolone cream 0.1 % Commonly known as: KENALOG Apply 1 Application topically as needed (flares).   VITAMIN D  PO Take 2,000 Units by mouth daily.        Follow-up Information     Andria Meuse, MD. Go on 04/07/2023.   Specialties: General Surgery, Colon and Rectal Surgery Why: 2:50 PM, please arrive 15 min prior to appointment time to check in. Contact information: [address] [place] [address] [phone]         Surgery, [place]. Go on 03/31/2023.   Specialty: General Surgery Why: 9:30 AM for drain removal. Please arrive 30 min prior to appointment time to check in. Contact information: [address] [place] [address] [phone]                Discharge Exam: Filed Weights   03/12/23 0738 03/14/23 1328 03/18/23 1225  Weight: 118.6 kg 118.6 kg 118.6 kg   General; NAD Abdomen Drain in placed.  Condition at discharge: stable  The results of significant diagnostics from this hospitalization (including imaging, microbiology, ancillary and laboratory) are listed below for reference.   Imaging Studies: DG ERCP  Result Date: 03/18/2023 CLINICAL DATA:  [id number] Surgery, elective [id number] subtotal cholecystectomy EXAM: ERCP TECHNIQUE: Multiple spot images obtained with the fluoroscopic device and submitted for interpretation post-procedure. COMPARISON:  CT from previous day FINDINGS: A series of fluoroscopic spot images document endoscopic cannulation and opacification of the CBD. Calcified gallstones are evident, with some contrast extending to the region of the stones. IMPRESSION: Endoscopic CBD cannulation and intervention as above, suspected leak from cystic duct stump. Electronically Signed   By: Corlis Leak M.D.   On: 03/18/2023 18:32   CT ABDOMEN PELVIS W CONTRAST  Result Date: 03/18/2023 CLINICAL DATA:  Peritonitis EXAM: CT ABDOMEN AND PELVIS WITH CONTRAST TECHNIQUE: Multidetector CT imaging of the abdomen and pelvis was performed using the standard protocol following bolus administration of intravenous contrast.  RADIATION DOSE REDUCTION: This exam was performed according to the departmental dose-optimization program which includes automated exposure control, adjustment of the mA and/or kV according to patient size and/or use of iterative reconstruction technique. CONTRAST:  OMNIPAQUE IOHEXOL 300 MG/ML  SOLN COMPARISON:  None Available. FINDINGS: Lower chest: No acute abnormality. Hepatobiliary: Numerous residual calcified gallstones are seen within the gallbladder fossa. When compared to prior examination, the gallbladder has been evacuated of its fluid component and a a surgical drainage catheter is seen immediately adjacent to the decompressed gallbladder fundus. The liver is unremarkable. No intra or extrahepatic biliary ductal dilation. No loculated perihepatic or intra-abdominal fluid collections are identified. No free perihepatic fluid. Pancreas: Unremarkable Spleen: Unremarkable Adrenals/Urinary Tract: The adrenal glands are unremarkable. The kidneys are normal in size and position. Scattered  bilateral 1-2 mm punctate nonobstructing calculi are identified. The kidneys are otherwise unremarkable. The bladder is decompressed. There is, however, circumferential bladder wall thickening suggesting changes of chronic bladder outlet obstruction. Stomach/Bowel: Stomach is within normal limits. Appendix appears normal. No evidence of bowel wall thickening, distention, or inflammatory changes. No free intraperitoneal gas or fluid. Vascular/Lymphatic: Aortic atherosclerosis. No enlarged abdominal or pelvic lymph nodes. Reproductive: Marked prostatic hypertrophy. Other: No abdominal wall hernia. Musculoskeletal: Right unilateral L5 pars defect. Osseous structures are age-appropriate. No acute bone abnormality. IMPRESSION: 1. Interval evacuation of the gallbladder of its fluid component with a surgical drainage catheter seen immediately adjacent to the decompressed gallbladder fundus. Numerous residual calcified gallstones  are seen within the gallbladder fossa. No loculated perihepatic or intra-abdominal fluid collections are identified. No free perihepatic fluid. 2. Bilateral nonobstructing nephrolithiasis. 3. Marked prostatic hypertrophy. 4. Circumferential bladder wall thickening suggesting changes of chronic bladder outlet obstruction. The bladder, however, is decompressed. Aortic Atherosclerosis (ICD10-I70.0). Electronically Signed   By: Helyn Numbers M.D.   On: 03/18/2023 04:10   MR ABDOMEN MRCP W WO CONTAST  Result Date: 03/13/2023 CLINICAL DATA:  Rule out choledocholithiasis, abdominal pain EXAM: MRI ABDOMEN WITHOUT AND WITH CONTRAST (INCLUDING MRCP) TECHNIQUE: Multiplanar multisequence MR imaging of the abdomen was performed both before and after the administration of intravenous contrast. Heavily T2-weighted images of the biliary and pancreatic ducts were obtained, and three-dimensional MRCP images were rendered by post processing. CONTRAST:  10mL GADAVIST GADOBUTROL 1 MMOL/ML IV SOLN COMPARISON:  CT abdomen pelvis, 03/12/2023 FINDINGS: Lower chest: Trace bilateral pleural effusions. Hepatobiliary: No solid liver abnormality is seen. Probably need to pair of the monitor in the camera numerous small gallstones and sludge within the gallbladder, including near the gallbladder neck, with gallbladder wall thickening. Pancreas: Unremarkable. No pancreatic ductal dilatation or surrounding inflammatory changes. Spleen: Normal in size without significant abnormality. Adrenals/Urinary Tract: Adrenal glands are unremarkable. Kidneys are normal, without renal calculi, solid lesion, or hydronephrosis. Stomach/Bowel: Stomach is within normal limits. No evidence of bowel wall thickening, distention, or inflammatory changes. Vascular/Lymphatic: No significant vascular findings are present. No enlarged abdominal lymph nodes. Other: No abdominal wall hernia or abnormality. No ascites. Musculoskeletal: No acute or significant osseous  findings. IMPRESSION: 1. Numerous small gallstones and sludge within the gallbladder, including near the gallbladder neck, with gallbladder wall thickening. Findings are consistent with acute cholecystitis. 2. No biliary ductal dilatation or choledocholithiasis. 3. Trace bilateral pleural effusions. Electronically Signed   By: Jearld Lesch M.D.   On: 03/13/2023 21:13   MR 3D Recon At Scanner  Result Date: 03/13/2023 CLINICAL DATA:  Rule out choledocholithiasis, abdominal pain EXAM: MRI ABDOMEN WITHOUT AND WITH CONTRAST (INCLUDING MRCP) TECHNIQUE: Multiplanar multisequence MR imaging of the abdomen was performed both before and after the administration of intravenous contrast. Heavily T2-weighted images of the biliary and pancreatic ducts were obtained, and three-dimensional MRCP images were rendered by post processing. CONTRAST:  10mL GADAVIST GADOBUTROL 1 MMOL/ML IV SOLN COMPARISON:  CT abdomen pelvis, 03/12/2023 FINDINGS: Lower chest: Trace bilateral pleural effusions. Hepatobiliary: No solid liver abnormality is seen. Probably need to pair of the monitor in the camera numerous small gallstones and sludge within the gallbladder, including near the gallbladder neck, with gallbladder wall thickening. Pancreas: Unremarkable. No pancreatic ductal dilatation or surrounding inflammatory changes. Spleen: Normal in size without significant abnormality. Adrenals/Urinary Tract: Adrenal glands are unremarkable. Kidneys are normal, without renal calculi, solid lesion, or hydronephrosis. Stomach/Bowel: Stomach is within normal limits. No evidence of bowel wall thickening,  distention, or inflammatory changes. Vascular/Lymphatic: No significant vascular findings are present. No enlarged abdominal lymph nodes. Other: No abdominal wall hernia or abnormality. No ascites. Musculoskeletal: No acute or significant osseous findings. IMPRESSION: 1. Numerous small gallstones and sludge within the gallbladder, including near the  gallbladder neck, with gallbladder wall thickening. Findings are consistent with acute cholecystitis. 2. No biliary ductal dilatation or choledocholithiasis. 3. Trace bilateral pleural effusions. Electronically Signed   By: Jearld Lesch M.D.   On: 03/13/2023 21:13   US Abdomen Limited RUQ (LIVER/GB)  Result Date: 03/12/2023 CLINICAL DATA:  Right upper quadrant pain, nausea, and vomiting. Findings concerning for acute cholecystitis on CT. EXAM: ULTRASOUND ABDOMEN LIMITED RIGHT UPPER QUADRANT COMPARISON:  CT abdomen and pelvis 03/12/2023 FINDINGS: Gallbladder: Multiple stones in the gallbladder. The margins of the gallbladder wall are not particularly well-defined on this ultrasound, however gallbladder wall thickening is suspected when correlating with the earlier CT. A positive sonographic Eulah Pont sign was reported by the sonographer. Common bile duct: Diameter: 3 mm Liver: Diffusely increased parenchymal echogenicity without a focal lesion identified. Portal vein is patent on color Doppler imaging with normal direction of blood flow towards the liver. Other: None. IMPRESSION: 1. Cholelithiasis with positive sonographic Murphy sign and suspected gallbladder wall thickening, concerning for acute cholecystitis. 2. No biliary dilatation. 3. Hepatic steatosis. Electronically Signed   By: Sebastian Ache M.D.   On: 03/12/2023 11:18   CT ABDOMEN PELVIS W CONTRAST  Result Date: 03/12/2023 CLINICAL DATA:  Abdominal pain, acute, nonlocalized eval for SBO vs cholecystitis EXAM: CT ABDOMEN AND PELVIS WITH CONTRAST TECHNIQUE: Multidetector CT imaging of the abdomen and pelvis was performed using the standard protocol following bolus administration of intravenous contrast. RADIATION DOSE REDUCTION: This exam was performed according to the departmental dose-optimization program which includes automated exposure control, adjustment of the mA and/or kV according to patient size and/or use of iterative reconstruction technique.  CONTRAST:  OMNIPAQUE IOHEXOL 300 MG/ML  SOLN COMPARISON:  CT urogram from 09/28/2009. FINDINGS: Lower chest: There are patchy atelectatic changes in the visualized lung bases. No overt consolidation. No pleural effusion. The heart is normal in size. No pericardial effusion. Hepatobiliary: The liver is normal in size. Non-cirrhotic configuration. No suspicious mass. These is mild diffuse hepatic steatosis. No intrahepatic or extrahepatic bile duct dilation. Gallbladder is distended measuring up to 4.2 cm in width. There is mild pericholecystic fat stranding. There are multiple layering gallstones in the neck region. There is a 3.5 x 5 mm stone which is outside the gallbladder lumen and likely within the cystic duct. In appropriate clinical setting, findings are highly concerning for acute cholecystitis. Correlate clinically to determine the need for additional imaging with nuclear medicine HIDA scan or ultrasound. Pancreas: Unremarkable. No pancreatic ductal dilatation or surrounding inflammatory changes. Spleen: Within normal limits. No focal lesion. Adrenals/Urinary Tract: Adrenal glands are unremarkable. No suspicious renal mass. No hydronephrosis. No renal or ureteric calculi. Unremarkable urinary bladder. Stomach/Bowel: No disproportionate dilation of the small or large bowel loops. No evidence of abnormal bowel wall thickening or inflammatory changes. The appendix is unremarkable. There are scattered diverticula throughout the colon, without imaging signs of diverticulitis. Vascular/Lymphatic: No ascites or pneumoperitoneum. No abdominal or pelvic lymphadenopathy, by size criteria. No aneurysmal dilation of the major abdominal arteries. There are mild peripheral atherosclerotic vascular calcifications of the aorta and its major branches. Reproductive: Enlarged prostate. Symmetric seminal vesicles. Other: There are bilateral small fat containing inguinal hernias. Partially seen intramuscular lipoma in the  right proximal  sartorius muscle. Musculoskeletal: No suspicious osseous lesions. There are mild multilevel degenerative changes in the visualized spine. IMPRESSION: *Findings highly concerning for acute cholecystitis. Correlate clinically to determine the need for additional imaging with nuclear medicine HIDA scan or ultrasound. *Multiple other nonacute observations, as described above. Electronically Signed   By: Jules Schick M.D.   On: 03/12/2023 09:20    Microbiology: Results for orders placed or performed during the hospital encounter of 03/12/23  Culture, blood (Routine X 2) w Reflex to ID Panel     Status: None   Collection Time: 03/13/23 12:15 AM   Specimen: BLOOD  Result Value Ref Range Status   Specimen Description   Final    BLOOD BLOOD LEFT HAND Performed at [facility], [address], [place], [place] [address]    Special Requests   Final    BOTTLES DRAWN AEROBIC AND ANAEROBIC Blood Culture adequate volume Performed at [facility], [address] [place], [place] [address]    Culture   Final    NO GROWTH 5 DAYS Performed at [facility], [address] [place], [place] [address]    Report Status 03/18/2023 FINAL  Final  Culture, blood (Routine X 2) w Reflex to ID Panel     Status: None   Collection Time: 03/13/23 12:22 AM   Specimen: BLOOD  Result Value Ref Range Status   Specimen Description   Final    BLOOD BLOOD RIGHT HAND Performed at [facility], [address] [place], [place] [address]    Special Requests   Final    BOTTLES DRAWN AEROBIC AND ANAEROBIC Blood Culture adequate volume Performed at [facility], [address], [place], [place] [address]    Culture   Final    NO GROWTH 5 DAYS Performed at [facility], [address], [place], [place] [address]    Report Status 03/18/2023 FINAL  Final  MRSA Next Gen by PCR, Nasal     Status: None   Collection Time:  03/14/23 10:46 AM   Specimen: Nasal Mucosa; Nasal Swab  Result Value Ref Range Status   MRSA by PCR Next Gen NOT DETECTED NOT DETECTED Final    Comment: (NOTE) The GeneXpert MRSA Assay (FDA approved for NASAL specimens only), is one component of a comprehensive MRSA colonization surveillance program. It is not intended to diagnose MRSA infection nor to guide or monitor treatment for MRSA infections. Test performance is not FDA approved in patients less than 7 years old. Performed at [facility], [address] [place], [place] [address]     Labs: CBC: Recent Labs  Lab 03/18/23 0430 03/19/23 0424 03/21/23 0432 03/23/23 0424  WBC 6.2 7.4 5.6 5.2  HGB 13.0 14.7 12.7* 13.5  HCT 40.5 44.6 40.4 41.5  MCV 91.2 91.4 91.4 90.8  PLT 210 246 232 257   Basic Metabolic Panel: Recent Labs  Lab 03/19/23 0424 03/20/23 0500 03/21/23 0432 03/22/23 0451 03/23/23 0424 03/24/23 0729  NA 137 138 135 139  --  138  K 4.5 3.4* 3.7 3.4* 3.9 4.5  CL 102 101 102 106  --  101  CO2 25 28 24 24   --  29  GLUCOSE 121* 99 123* 127*  --  103*  BUN 13 13 10 9   --  11  CREATININE 0.89 1.00 0.91 0.81  --  0.86  CALCIUM 9.2 8.8* 8.2* 8.4*  --  9.1  MG  --   --   --  2.1  --   --   PHOS  --   --   --  2.8  --   --    Liver Function Tests: Recent Labs  Lab 03/18/23 0430 03/19/23 0424 03/20/23 0500 03/22/23 0451  AST 27 27 26 21   ALT 29 35 32 36  ALKPHOS 36* 44 42 45  BILITOT 1.5* 1.4* 1.6* 0.6  PROT 6.0* 6.7 6.1* 6.0*  ALBUMIN 2.9* 3.1* 2.9* 2.8*   CBG: Recent Labs  Lab 03/23/23 0712 03/23/23 1136 03/23/23 1633 03/23/23 2130 03/24/23 0717  GLUCAP 127* 128* 146* 148* 103*    Discharge time spent: greater than 30 minutes.  Signed: Alba Cory, MD [facility] 03/24/2023

## 2023-03-24 NOTE — Discharge Instructions (Signed)

## 2023-03-24 NOTE — Progress Notes (Signed)
6 Days Post-Op  Subjective: Pain well controlled on tylenol. Reviewed drain care and patient is feeling more comfortable with this. Tolerating diet and having bowel function. Has questions about when stent might be removed.   Objective: Vital signs in last 24 hours: Temp:  [97.7 F (36.5 C)-97.8 F (36.6 C)] 97.8 F (36.6 C) (08/11 2049) Pulse Rate:  [58-79] 58 (08/12 0542) Resp:  [18] 18 (08/12 0542) BP: (144-159)/(79-86) 155/84 (08/12 0542) SpO2:  [96 %-97 %] 96 % (08/12 0542) FiO2 (%):  [21 %] 21 % (08/11 2300) Last BM Date : 03/23/23  Intake/Output from previous day: 08/11 0701 - 08/12 0700 In: 1080 [P.O.:1080] Out: 1380 [Urine:1300; Drains:80] Intake/Output this shift: No intake/output data recorded.  PE: Gen:  Alert, NAD, pleasant Card:  Reg Pulm:  Rate and effort normal Abd: Soft, mild distension, appropriately tender around laparoscopic incisions, no rigidity or guarding and otherwise NT, +BS. Incisions with glue intact appears well and are without drainage, bleeding, or signs of infection. JP drain bilious - 80 cc/24 hours Psych: A&Ox3   Lab Results:  Recent Labs    03/23/23 0424  WBC 5.2  HGB 13.5  HCT 41.5  PLT 257   BMET Recent Labs    03/22/23 0451 03/23/23 0424  NA 139  --   K 3.4* 3.9  CL 106  --   CO2 24  --   GLUCOSE 127*  --   BUN 9  --   CREATININE 0.81  --   CALCIUM 8.4*  --    PT/INR No results for input(s): "LABPROT", "INR" in the last 72 hours. CMP     Component Value Date/Time   NA 139 03/22/2023 0451   K 3.9 03/23/2023 0424   CL 106 03/22/2023 0451   CO2 24 03/22/2023 0451   GLUCOSE 127 (H) 03/22/2023 0451   BUN 9 03/22/2023 0451   CREATININE 0.81 03/22/2023 0451   CREATININE 0.88 07/18/2022 1333   CALCIUM 8.4 (L) 03/22/2023 0451   PROT 6.0 (L) 03/22/2023 0451   ALBUMIN 2.8 (L) 03/22/2023 0451   AST 21 03/22/2023 0451   ALT 36 03/22/2023 0451   ALKPHOS 45 03/22/2023 0451   BILITOT 0.6 03/22/2023 0451   GFRNONAA  >60 03/22/2023 0451   GFRNONAA 75 03/16/2020 1038   GFRAA 87 03/16/2020 1038   Lipase     Component Value Date/Time   LIPASE 24 03/22/2023 0451    Studies/Results: No results found.  Anti-infectives: Anti-infectives (From admission, onward)    Start     Dose/Rate Route Frequency Ordered Stop   03/19/23 1200  piperacillin-tazobactam (ZOSYN) IVPB 3.375 g        3.375 g 12.5 mL/hr over 240 Minutes Intravenous Every 8 hours 03/19/23 0822 03/20/23 0816   03/12/23 2000  piperacillin-tazobactam (ZOSYN) IVPB 3.375 g  Status:  Discontinued        3.375 g 12.5 mL/hr over 240 Minutes Intravenous Every 8 hours 03/12/23 1230 03/19/23 0822   03/12/23 1245  piperacillin-tazobactam (ZOSYN) IVPB 3.375 g        3.375 g 100 mL/hr over 30 Minutes Intravenous  Once 03/12/23 1230 03/12/23 1325        Assessment/Plan POD 10 s/p Laparoscopic subtotal cholecystectomy for acute on chronic gangrenous cholecystitis by Dr. Cliffton Asters on 03/14/23 - Completed post op abx. CT 8/5 w/ no loculated perihepatic or intra-abdominal fluid collections. Afebrile. WBC wnl.  - S/p ERCP and CBD stenting on 8/6. Drain output still bilious but downtrending. Monitor.  Would like to see this continuing to downtrend and clearing up before d/c.   - CT w/ residual calcified gallstones. Discussed with attending. No plans for further surgery inpatient. Can discuss if he needs an additional surgery as outpatient during his f/u appointment.  - Mobilize - therapies  - Pulm toilet  - stable for discharge from surgical perspective. Follow up and instructions in AVS  FEN - Soft. Cont bowel regimen. IVF per [facility] VTE - SCDs, Lovenox  ID - Zosyn completed post op. None currently.    LOS: 12 days    Juliet Rude , [facility] 03/24/2023, 9:19 AM Please see Amion for pager number during day hours 7:00am-4:30pm

## 2023-03-24 NOTE — Progress Notes (Signed)
Patient discharged home, education provided regarding JP drain, IV removed, discharge paperwork explained and patient verbalized understanding.

## 2023-03-25 ENCOUNTER — Telehealth: Payer: Self-pay | Admitting: *Deleted

## 2023-03-25 ENCOUNTER — Encounter: Payer: Self-pay | Admitting: *Deleted

## 2023-03-25 NOTE — Transitions of Care (Post Inpatient/ED Visit) (Signed)
03/25/2023  Name: Jonathan Yu MRN: [id number] DOB: 12-11-1950  Today's TOC FU Call Status: Today's TOC FU Call Status:: Successful TOC FU Call Completed TOC FU Call Complete Date: 03/25/23  Transition Care Management Follow-up Telephone Call Date of Discharge: 03/24/23 Discharge Facility: [facility]) Type of Discharge: Inpatient Admission Primary Inpatient Discharge Diagnosis:: Acute cholecystitis with cholecystectomy How have you been since you were released from the hospital?: Better ("I am doing fine; I slept good last night, am eating good; and had a BM today.  Not having any problems managing this drain.  I go see Dr. Ashley Royalty on Thursday and the surgeon group next week") Any questions or concerns?: No  Items Reviewed: Did you receive and understand the discharge instructions provided?: Yes (thoroughly reviewed with patient who verbalizes good understanding of same) Medications obtained,verified, and reconciled?: Partial Review Completed (confirmed self-manages medications and denies questions/ concerns around medications today) Reason for Partial Mediation Review: Patient declined, not near medication list-- discussed newly prescribed medications and confirmed patient is aware and has plans to contact outpatient pharmacy to obtain; patient stated he prefers to review medications at time of upcoming PCP appointment 03/27/23 Any new allergies since your discharge?: No Dietary orders reviewed?: Yes Type of Diet Ordered:: "Heart Healthy and as healthy as possible after the surgery" Do you have support at home?: Yes People in Home: child(ren), adult Name of Support/Comfort Primary Source: Reports independent in self-care activities; resides with adult children-- assists as/ if needed/ indicated  Medications Reviewed Today: Medications Reviewed Today     Reviewed by Michaela Corner, RN (Registered Nurse) on 03/25/23 at 1453  Med List Status: <None>   Medication Order Taking?  Sig Documenting Provider Last Dose Status Informant  acetaminophen (TYLENOL) 500 MG tablet [id number]  Take 1 tablet (500 mg total) by mouth every 6 (six) hours as needed. Regalado, Belkys A, MD  Active   albuterol (PROVENTIL HFA;VENTOLIN HFA) 108 (90 Base) MCG/ACT inhaler [id number]  Inhale 2 puffs into the lungs every 6 (six) hours as needed for wheezing or shortness of breath. Rodolph Bong, MD  Active Self, Pharmacy Records  AMBULATORY [facility] MEDICATION [id number]  Continue CPAP at current settings.  Please provide new supplies. [name] phone number [phone] Rodolph Bong, MD  Active Self, Pharmacy Records  AMBULATORY [facility] MEDICATION [id number]  Compression stockings.  Vive size LGM Leg swelling. Disp 1 pair 99 refil. Rodolph Bong, MD  Active Self, Pharmacy Records  amLODipine [facility]) 5 MG tablet [id number]  Take 1 tablet (5 mg total) by mouth daily. Alba Cory, MD  Active            Med Note Michaela Corner   Tue Mar 25, 2023  2:53 PM) 03/25/23: Reports during [facility] call has not yet been notified by outpatient pharmacy that this is ready for pick up; patient wishes to discuss meds with PCP- declines full medication review today  anastrozole (ARIMIDEX) 1 MG tablet [id number]  Take 1 tablet (1 mg total) by mouth every Monday, Wednesday, and Friday. Everrett Coombe, DO  Active Self, Pharmacy Records  atorvastatin (LIPITOR) 40 MG tablet [id number]  TAKE 1 TABLET EVERY DAY  Patient taking differently: Take 40 mg by mouth daily.   Everrett Coombe, DO  Active Self, Pharmacy Records  Azelaic Acid 15 % cream [id number]  Apply 1 application topically 2 (two) times daily.  Patient taking differently: Apply 1 application  topically 2 (two) times daily as needed (flares).  Rodolph Bong, MD  Active Self, Pharmacy Records  busPIRone (BUSPAR) 5 MG tablet [id number]  Take 1 tablet (5 mg total) by mouth 4 (four) times daily as needed.  Patient taking differently: Take 5 mg by mouth  3 (three) times daily.   Butch Penny, NP  Active Self, Pharmacy Records           Med Note [place], Charlott Rakes Sep 26, 2022  1:04 PM) Takes 1 tid (sometime qid)  Carbidopa-Levodopa ER (SINEMET CR) 25-100 MG tablet controlled release [id number]  Take 1 tablet by mouth daily. Dose will increase by 1 tablet each week until taking 3 times daily [provider]  Active Self, Pharmacy Records  fluticasone [facility]) 50 MCG/ACT nasal spray [id number]  Place 1 spray into both nostrils as needed for allergies. [provider]  Active Self, Pharmacy Records           Med Note [place], Charlott Rakes Sep 26, 2022  1:05 PM) Uses once weekly)  furosemide (LASIX) 20 MG tablet [id number]  TAKE 1 TABLET BY MOUTH ONCE DAILY AS NEEDED FOR EDEMA  Patient taking differently: Take 20 mg by mouth as needed for edema or fluid.   Everrett Coombe, DO  Active Self, Pharmacy Records  glipiZIDE (GLUCOTROL) 5 MG tablet [id number]  Take 1 tablet (5 mg total) by mouth 2 (two) times daily before a meal. Everrett Coombe, DO  Active Self, Pharmacy Records  LINZESS 145 MCG CAPS capsule [id number]  Take 145 mcg by mouth daily. [provider]  Active Self, Pharmacy Records  loratadine (CLARITIN) 10 MG tablet [id number]  Take 10 mg by mouth daily as needed for allergies. [provider]  Active Self, Pharmacy Records  losartan (COZAAR) 50 MG tablet [id number]  TAKE 1 TABLET EVERY DAY  Patient taking differently: Take 50 mg by mouth daily.   Everrett Coombe, DO  Active Self, Pharmacy Records  metFORMIN (GLUCOPHAGE-XR) 500 MG 24 hr tablet [id number]  Take 1 tablet (500 mg total) by mouth 2 (two) times daily with a meal. Everrett Coombe, DO  Active Self, Pharmacy Records  Misc. Devices (FLEX THERAPY) MISC [id number]  by Does not apply route. [provider]  Active Self, Pharmacy Records  mometasone (ELOCON) 0.1 % cream [id number]  Apply 1 Application topically daily. Everrett Coombe, DO  Active Self,  Pharmacy Records  Multiple Vitamin (MULTIVITAMIN WITH MINERALS) TABS tablet [id number]  Take 1 tablet by mouth daily. [provider]  Active Self, Pharmacy Records  ondansetron (ZOFRAN) 4 MG tablet [id number]  Take 1 tablet (4 mg total) by mouth every 6 (six) hours as needed for nausea. Alba Cory, MD  Active            Med Note Michaela Corner   Tue Mar 25, 2023  2:53 PM) 03/25/23: Reports during [facility] call has not yet been notified by outpatient pharmacy that this is ready for pick up; patient wishes to discuss meds with PCP- declines full medication review today   Probiotic Product (PROBIOTIC PO) [id number]  Take 2 capsules by mouth daily. [provider]  Active Self, Pharmacy Records  rOPINIRole (REQUIP) 2 MG tablet [id number]  TAKE 1 TABLET THREE TIMES DAILY Huston Foley, MD  Active Self, Pharmacy Records  tamsulosin [facility]) 0.4 MG CAPS capsule [id number]  Take 2 capsules (0.8 mg total) by mouth daily. Everrett Coombe, DO  Active Self, Pharmacy Records  traMADol (ULTRAM) 50 MG tablet [id number]  TAKE 1  TABLET BY MOUTH EVERY 8 HOURS AS NEEDED FOR MODERATE PAIN Regalado, Belkys A, MD  Active   triamcinolone cream (KENALOG) 0.1 % [id number]  Apply 1 Application topically as needed (flares). [provider]  Active Self, Pharmacy Records  VITAMIN D PO [id number]  Take 2,000 Units by mouth daily. [provider]  Active Self, Pharmacy Records           Med Note (MORA Renee Rival A   Wed Mar 12, 2023  2:25 PM)             Home Care and Equipment/Supplies: Were Home Health Services Ordered?: No Any new equipment or medical supplies ordered?: No  Functional Questionnaire: Do you need assistance with bathing/showering or dressing?: No Do you need assistance with meal preparation?: No Do you need assistance with eating?: No Do you have difficulty maintaining continence: No Do you need assistance with getting out of bed/getting out of a chair/moving?:  No Do you have difficulty managing or taking your medications?: No  Follow up appointments reviewed: PCP Follow-up appointment confirmed?: Yes Date of PCP follow-up appointment?: 03/27/23 Follow-up Provider: PCP Specialist Hospital Follow-up appointment confirmed?: Yes Date of Specialist follow-up appointment?: 03/31/23 Follow-Up Specialty Provider:: surgical Do you need transportation to your follow-up appointment?: No Do you understand care options if your condition(s) worsen?: Yes-patient verbalized understanding  SDOH Interventions Today    Flowsheet Row Most Recent Value  SDOH Interventions   Food Insecurity Interventions Intervention Not Indicated  Transportation Interventions Intervention Not Indicated  [normally drives self,  adult local children assisting with transportation after recent surgery]      TOC Interventions Today    Flowsheet Row Most Recent Value  TOC Interventions   TOC Interventions Discussed/Reviewed TOC Interventions Discussed      Interventions Today    Flowsheet Row Most Recent Value  Chronic Disease   Chronic disease during today's visit Other  [acute cholecystitis with surgical cholecystectomy]  General Interventions   General Interventions Discussed/Reviewed General Interventions Discussed, Doctor Visits, Durable Medical Equipment (DME)  Doctor Visits Discussed/Reviewed Doctor Visits Discussed, PCP, Specialist  Durable Medical Equipment (DME) Other  [confirmed currently requiring/ using assistive devices - cane prn]  PCP/Specialist Visits Compliance with follow-up visit  Education Interventions   Education Provided Provided Education  Provided Verbal Education On Other  [Reinforced care and maintenance of JP drain]  Nutrition Interventions   Nutrition Discussed/Reviewed Nutrition Discussed  Pharmacy Interventions   Pharmacy Dicussed/Reviewed Pharmacy Topics Discussed      Caryl Pina, RN, BSN, CCRN Alumnus RN CM Care  Coordination/ Transition of Care- [facility] Care Management [phone]: direct office

## 2023-03-26 ENCOUNTER — Ambulatory Visit: Payer: Medicare HMO | Admitting: Family Medicine

## 2023-03-27 ENCOUNTER — Ambulatory Visit (INDEPENDENT_AMBULATORY_CARE_PROVIDER_SITE_OTHER): Payer: Medicare HMO | Admitting: Family Medicine

## 2023-03-27 ENCOUNTER — Encounter: Payer: Self-pay | Admitting: Family Medicine

## 2023-03-27 VITALS — BP 133/75 | HR 86 | Ht 65.0 in | Wt 252.3 lb

## 2023-03-27 DIAGNOSIS — G20C Parkinsonism, unspecified: Secondary | ICD-10-CM | POA: Diagnosis not present

## 2023-03-27 DIAGNOSIS — I10 Essential (primary) hypertension: Secondary | ICD-10-CM

## 2023-03-27 DIAGNOSIS — E291 Testicular hypofunction: Secondary | ICD-10-CM | POA: Diagnosis not present

## 2023-03-27 DIAGNOSIS — K839 Disease of biliary tract, unspecified: Secondary | ICD-10-CM

## 2023-03-27 DIAGNOSIS — R5381 Other malaise: Secondary | ICD-10-CM | POA: Diagnosis not present

## 2023-03-27 DIAGNOSIS — Z7984 Long term (current) use of oral hypoglycemic drugs: Secondary | ICD-10-CM

## 2023-03-27 DIAGNOSIS — Z9049 Acquired absence of other specified parts of digestive tract: Secondary | ICD-10-CM

## 2023-03-27 DIAGNOSIS — E119 Type 2 diabetes mellitus without complications: Secondary | ICD-10-CM

## 2023-03-27 MED ORDER — AMBULATORY NON FORMULARY MEDICATION: 0 refills | Status: AC

## 2023-03-27 MED ORDER — TESTOSTERONE CYPIONATE 200 MG/ML IM SOLN
200.0000 mg | INTRAMUSCULAR | Status: DC
Start: 2023-03-27 — End: 2024-05-20
  Administered 2023-03-27 – 2024-04-23 (×12): 200 mg via INTRAMUSCULAR

## 2023-03-27 NOTE — Progress Notes (Signed)
Faxed DME Rx to [facility] with insurance card and demo sheet.

## 2023-03-27 NOTE — Patient Instructions (Signed)
Have labs completed in about 1 week.  See me agin in 3 months.

## 2023-03-27 NOTE — Progress Notes (Signed)
Jonathan Yu - 72 y.o. male MRN [id number]  Date of birth: 08-Dec-1950  Subjective Chief Complaint  Patient presents with   Hospitalization Follow-up    HPI Jonathan Yu is a 72 y.o. male here today for follow up.   Recently hospitalized for gangrenous cholecystitis.  He underwent cholecystectomy which was complicated by bile leak.  He has been followed by general surgery as well as gastroenterology.  He has a drain in place at this time.  He did undergo MRCP as well with stent.  He follows up with his surgeon next week.  Drain output is pretty minimal.  He was seen by neurology at [facility] since his last visit with me.  Started on Sinemet in addition to ropinirole.  His symptoms are under little better control at this time.  Significant side effects noted.  Remains on combination of metformin and glipizide for management of his diabetes.  Tolerating this well at current strength.  Blood sugars have been pretty well-controlled.  A1c on 03/13/2023 was 0.7%.  Blood pressure is well-controlled with losartan and amlodipine.  Denies symptoms of hypotension.  Would like to switch back to every other week testosterone injections.  Felt better when taking this every couple of weeks.    Allergies  Allergen Reactions   Sesame Oil Hives and Shortness Of Breath   Clonazepam Hives    Tolerates Ativan   Tape Other (See Comments)    White surgical tape caused some blisters.   Percocet [Oxycodone-Acetaminophen] Rash   Topamax [Topiramate] Other (See Comments)    Cognitive decline   Vicodin [Hydrocodone-Acetaminophen] Rash    Past Medical History:  Diagnosis Date   Allergy    DDD (degenerative disc disease), lumbar    Hyperlipidemia    "no meds since losing 106# 2 yr ago" (10/06/2013)   Hypertension 08/12/2010   "no meds since losing 106# 2 yr ago" (10/06/2013)   OSA on CPAP    "don't wear mask much since losing 106# 2 yr ago" (10/06/2013)   Parkinson's disease    Type II diabetes mellitus  (HCC)    "no meds since losing 106# 2 yr ago" (10/06/2013)   Walking pneumonia 08/12/1966    Past Surgical History:  Procedure Laterality Date   ANKLE FRACTURE SURGERY Left 1973   ANKLE RECONSTRUCTION Right 05/2011   BILIARY STENT PLACEMENT N/A 03/18/2023   Procedure: BILIARY STENT PLACEMENT;  Surgeon: Vida Rigger, MD;  Location: [facility];  Service: Gastroenterology;  Laterality: N/A;   CARPAL TUNNEL RELEASE Bilateral 2001   CHOLECYSTECTOMY N/A 03/14/2023   Procedure: LAPAROSCOPIC SUBTOTAL CHOLECYSTECTOMY WITH ICG DYE;  Surgeon: Andria Meuse, MD;  Location: [facility];  Service: General;  Laterality: N/A;   ERCP N/A 03/18/2023   Procedure: ENDOSCOPIC RETROGRADE CHOLANGIOPANCREATOGRAPHY (ERCP);  Surgeon: Vida Rigger, MD;  Location: [facility];  Service: Gastroenterology;  Laterality: N/A;   HARDWARE REMOVAL Right 10/07/2013   Procedure: RIGHT ANKLE HARDWARE REMOVAL;  Surgeon: Nadara Mustard, MD;  Location: [facility] OR;  Service: Orthopedics;  Laterality: Right;   I & D EXTREMITY Right 11/18/2013   Procedure: IRRIGATION AND DEBRIDEMENT EXTREMITY;  Surgeon: Nadara Mustard, MD;  Location: [facility] OR;  Service: Orthopedics;  Laterality: Right;  Irrigation and Debridement Right Fibula , Place Antibiotic Beads and VAC   NASAL SEPTUM SURGERY  1982   ORIF ANKLE FRACTURE Right 09/08/2013   Procedure: REPAIR SYNDESMOSIS DISRUPTION RIGHT ANKLE;  Surgeon: Nadara Mustard, MD;  Location: [facility] OR;  Service: Orthopedics;  Laterality: Right;  SHOULDER ARTHROSCOPY W/ ROTATOR CUFF REPAIR Right 2006; 2009   SPHINCTEROTOMY  03/18/2023   Procedure: SPHINCTEROTOMY;  Surgeon: Vida Rigger, MD;  Location: [facility];  Service: Gastroenterology;;   TONSILLECTOMY  1959   TOTAL ANKLE ARTHROPLASTY Right 08/18/2013   Procedure: TOTAL ANKLE ARTHOPLASTY;  Surgeon: Nadara Mustard, MD;  Location: [facility] OR;  Service: Orthopedics;  Laterality: Right;  Right Total Ankle Arthroplasty, Revision Fibular Fracture   URETERAL STENT PLACEMENT  ~ 2010 X 2     Social History   Socioeconomic History   Marital status: Divorced    Spouse name: Not on file   Number of children: 6   Years of education: 14   Highest education level: Some college, no degree  Occupational History   Occupation: sales/ DJ    Comment: part time  Tobacco Use   Smoking status: Some Days    Types: Pipe, Cigars   Smokeless tobacco: Never   Tobacco comments:    cigar 1-2 times/mont  Vaping Use   Vaping status: Never Used  Substance and Sexual Activity   Alcohol use: Yes    Comment: 1-2 drinks per year.   Drug use: No   Sexual activity: Yes  Other Topics Concern   Not on file  Social History Narrative   Lives with his son and his daughter. He does Hotel manager, enjoys singing, music and yard work (when he can).    Social Determinants of Health   Financial Resource Strain: Low Risk  (05/03/2022)   Overall Financial Resource Strain (CARDIA)    Difficulty of Paying Living Expenses: Not hard at all  Food Insecurity: No Food Insecurity (03/25/2023)   Hunger Vital Sign    Worried About Running Out of Food in the Last Year: Never true    Ran Out of Food in the Last Year: Never true  Transportation Needs: No Transportation Needs (03/25/2023)   PRAPARE - Administrator, Civil Service (Medical): No    Lack of Transportation (Non-Medical): No  Physical Activity: Insufficiently Active (05/03/2022)   Exercise Vital Sign    Days of Exercise per Week: 1 day    Minutes of Exercise per Session: 60 min  Stress: No Stress Concern Present (05/03/2022)   [employer] of Occupational Health - Occupational Stress Questionnaire    Feeling of Stress : Not at all  Social Connections: Unknown (01/15/2023)   Received from [facility]   Social Network    Social Network: Not on file    Family History  Problem Relation Age of Onset   Cancer Mother 82       Lung   Alzheimer's disease Father    Parkinson's disease Father    Scoliosis Sister    Cleft  lip Sister     Health Maintenance  Topic Date Due   OPHTHALMOLOGY EXAM  01/11/2023   INFLUENZA VACCINE  03/13/2023   Medicare Annual Wellness (AWV)  05/04/2023   COVID-19 Vaccine (3 - Pfizer risk series) 05/02/2023 (Originally 12/08/2019)   HEMOGLOBIN A1C  09/13/2023   Diabetic kidney evaluation - Urine ACR  12/26/2023   FOOT EXAM  12/26/2023   Diabetic kidney evaluation - eGFR measurement  03/23/2024   Colonoscopy  06/03/2029   DTaP/Tdap/Td (3 - Td or Tdap) 05/11/2031   Pneumonia Vaccine 48+ Years old  Completed   Hepatitis C Screening  Completed   Zoster Vaccines- Shingrix  Completed   HPV VACCINES  Aged Out     ----------------------------------------------------------------------------------------------------------------------------------------------------------------------------------------------------------------- Physical Exam BP  133/75 (BP Location: Left Arm, Patient Position: Sitting, Cuff Size: Large)   Pulse 86   Ht 5\' 5"  (1.651 m)   Wt 252 lb 4.8 oz (114.4 kg)   SpO2 97%   BMI 41.98 kg/m   Physical Exam Constitutional:      Appearance: Normal appearance.  Cardiovascular:     Rate and Rhythm: Normal rate and regular rhythm.  Pulmonary:     Effort: Pulmonary effort is normal.     Breath sounds: Normal breath sounds.  Neurological:     Mental Status: He is alert.  Psychiatric:        Mood and Affect: Mood normal.        Behavior: Behavior normal.     ------------------------------------------------------------------------------------------------------------------------------------------------------------------------------------------------------------------- Assessment and Plan  Essential hypertension, benign Blood pressure remains controlled.  He will continue amlodipine and losartan at current strength.   Hypogonadism in male Switching back to every other week testosterone injections.  Given injection today.  Will plan to recheck in about 8  weeks.  Type 2 diabetes mellitus without complication, without long-term current use of insulin (HCC) Diabetes is pretty well-controlled at this time.  He will continue with glipizide and metformin.  Parkinsonism Seen by neurology recently had second opinion through [facility].  Sinemet added.  Bile leak Recently had cholecystectomy complicated by bile leak.  Followed by general surgery and GI.  Hoping to have drain removed next week.   Meds ordered this encounter  Medications   AMBULATORY NON FORMULARY MEDICATION    Sig: Please provide:  12 inch grab bar x2 Trapeze bar to assist with getting out of bed.  Dx: R53.81, Z90.49, K83.9    Dispense:  1 Units    Refill:  0   testosterone cypionate (DEPOTESTOSTERONE CYPIONATE) injection 200 mg    Return in about 3 months (around 06/27/2023) for F//u  HTN.    This visit occurred during the SARS-CoV-2 public health emergency.  Safety protocols were in place, including screening questions prior to the visit, additional usage of staff PPE, and extensive cleaning of exam room while observing appropriate contact time as indicated for disinfecting solutions.

## 2023-03-29 ENCOUNTER — Encounter: Payer: Self-pay | Admitting: Family Medicine

## 2023-03-29 DIAGNOSIS — K839 Disease of biliary tract, unspecified: Secondary | ICD-10-CM | POA: Insufficient documentation

## 2023-03-29 NOTE — Assessment & Plan Note (Signed)
Seen by neurology recently had second opinion through [facility].  Sinemet added.

## 2023-03-29 NOTE — Assessment & Plan Note (Signed)
Diabetes is pretty well-controlled at this time.  He will continue with glipizide and metformin.

## 2023-03-29 NOTE — Assessment & Plan Note (Signed)
Blood pressure remains controlled.  He will continue amlodipine and losartan at current strength.

## 2023-03-29 NOTE — Assessment & Plan Note (Signed)
Switching back to every other week testosterone injections.  Given injection today.  Will plan to recheck in about 8 weeks.

## 2023-03-29 NOTE — Assessment & Plan Note (Signed)
Recently had cholecystectomy complicated by bile leak.  Followed by general surgery and GI.  Hoping to have drain removed next week.

## 2023-04-09 ENCOUNTER — Other Ambulatory Visit: Payer: Self-pay | Admitting: Neurology

## 2023-04-10 ENCOUNTER — Telehealth: Payer: Self-pay | Admitting: Family Medicine

## 2023-04-10 ENCOUNTER — Ambulatory Visit (INDEPENDENT_AMBULATORY_CARE_PROVIDER_SITE_OTHER): Payer: Medicare HMO | Admitting: Medical-Surgical

## 2023-04-10 VITALS — BP 111/68 | HR 91 | Ht 65.0 in | Wt 247.2 lb

## 2023-04-10 DIAGNOSIS — E291 Testicular hypofunction: Secondary | ICD-10-CM | POA: Diagnosis not present

## 2023-04-10 DIAGNOSIS — I10 Essential (primary) hypertension: Secondary | ICD-10-CM | POA: Diagnosis not present

## 2023-04-10 DIAGNOSIS — Z9049 Acquired absence of other specified parts of digestive tract: Secondary | ICD-10-CM | POA: Diagnosis not present

## 2023-04-10 DIAGNOSIS — R5381 Other malaise: Secondary | ICD-10-CM

## 2023-04-10 MED ORDER — TESTOSTERONE CYPIONATE 200 MG/ML IM SOLN
200.0000 mg | Freq: Once | INTRAMUSCULAR | Status: AC
Start: 2023-04-10 — End: 2023-04-10
  Administered 2023-04-10: 200 mg via INTRAMUSCULAR

## 2023-04-10 NOTE — Progress Notes (Signed)
   Established Patient Office Visit  Subjective   Patient ID: Jonathan Yu, male    DOB: [birth year]  Age: 72 y.o. MRN: [id number]  Chief Complaint  Patient presents with   testosterone injection     Testosterone injection nurse visit.     HPI  Testosterone injection nurse visit. Patient denies chest pain or shortness of breath or problems with medication.  Patient recently in hospital for gall bladder removal - testosterone injection approved to be given today by Dr. Ashley Royalty.   ROS    Objective:     BP 111/68   Pulse 91   Ht 5\' 5"  (1.651 m)   Wt 247 lb 3 oz (112.1 kg)   SpO2 97%   BMI 41.13 kg/m    Physical Exam   No results found for any visits on 04/10/23.    The 10-year ASCVD risk score (Arnett DK, et al., 2019) is: 30.1%    Assessment & Plan:  Testosterone injection nurse visit. Admin 200mg  IM RUOQ. Patient tolerated injection well without complications. Return in 14 days for next testosterone injection.  Problem List Items Addressed This Visit       Endocrine   Hypogonadism in male - Primary    Return in about 2 weeks (around 04/24/2023) for return in 14 days for next testosterone injection nurse visit .    Elizabeth Palau, LPN

## 2023-04-10 NOTE — Telephone Encounter (Signed)
Patient called.  He is following up on referral to Dr. Clayborn Heron with [facility]. Ph contact for [facility]: [phone]

## 2023-04-10 NOTE — Patient Instructions (Signed)
return in 14 days for next testosterone injection nurse visit

## 2023-04-11 LAB — CBC WITH DIFFERENTIAL/PLATELET
Basophils Absolute: 0 10*3/uL (ref 0.0–0.2)
Basos: 0 %
EOS (ABSOLUTE): 0.2 10*3/uL (ref 0.0–0.4)
Eos: 3 %
Hematocrit: 40.5 % (ref 37.5–51.0)
Hemoglobin: 13.6 g/dL (ref 13.0–17.7)
Immature Grans (Abs): 0 10*3/uL (ref 0.0–0.1)
Immature Granulocytes: 0 %
Lymphocytes Absolute: 2 10*3/uL (ref 0.7–3.1)
Lymphs: 25 %
MCH: 29.2 pg (ref 26.6–33.0)
MCHC: 33.6 g/dL (ref 31.5–35.7)
MCV: 87 fL (ref 79–97)
Monocytes Absolute: 0.6 10*3/uL (ref 0.1–0.9)
Monocytes: 7 %
Neutrophils Absolute: 5.2 10*3/uL (ref 1.4–7.0)
Neutrophils: 65 %
Platelets: 277 10*3/uL (ref 150–450)
RBC: 4.65 x10E6/uL (ref 4.14–5.80)
RDW: 12.5 % (ref 11.6–15.4)
WBC: 8 10*3/uL (ref 3.4–10.8)

## 2023-04-11 LAB — CMP14+EGFR
ALT: 17 IU/L (ref 0–44)
AST: 13 IU/L (ref 0–40)
Albumin: 3.9 g/dL (ref 3.9–4.9)
Alkaline Phosphatase: 84 IU/L (ref 44–121)
BUN/Creatinine Ratio: 12 (ref 10–24)
BUN: 12 mg/dL (ref 8–27)
Bilirubin Total: 1.2 mg/dL (ref 0.0–1.2)
CO2: 23 mmol/L (ref 20–29)
Calcium: 9.6 mg/dL (ref 8.6–10.2)
Chloride: 103 mmol/L (ref 96–106)
Creatinine, Ser: 0.97 mg/dL (ref 0.76–1.27)
Globulin, Total: 2.8 g/dL (ref 1.5–4.5)
Glucose: 112 mg/dL — ABNORMAL HIGH (ref 70–99)
Potassium: 4.4 mmol/L (ref 3.5–5.2)
Sodium: 140 mmol/L (ref 134–144)
Total Protein: 6.7 g/dL (ref 6.0–8.5)
eGFR: 84 mL/min/{1.73_m2} (ref >59)

## 2023-04-11 LAB — TESTOSTERONE: Testosterone: 721 ng/dL (ref 264–916)

## 2023-04-15 ENCOUNTER — Telehealth: Payer: Self-pay | Admitting: Family Medicine

## 2023-04-16 ENCOUNTER — Other Ambulatory Visit: Payer: Self-pay | Admitting: Gastroenterology

## 2023-04-16 DIAGNOSIS — K839 Disease of biliary tract, unspecified: Secondary | ICD-10-CM | POA: Diagnosis not present

## 2023-04-21 ENCOUNTER — Other Ambulatory Visit: Payer: Self-pay

## 2023-04-21 ENCOUNTER — Encounter (HOSPITAL_COMMUNITY): Payer: Self-pay | Admitting: Gastroenterology

## 2023-04-23 ENCOUNTER — Other Ambulatory Visit: Payer: Self-pay | Admitting: Family Medicine

## 2023-04-24 ENCOUNTER — Ambulatory Visit (INDEPENDENT_AMBULATORY_CARE_PROVIDER_SITE_OTHER): Payer: Medicare HMO

## 2023-04-24 VITALS — BP 115/68 | HR 86 | Ht 65.0 in

## 2023-04-24 DIAGNOSIS — E291 Testicular hypofunction: Secondary | ICD-10-CM

## 2023-04-24 NOTE — Patient Instructions (Signed)
Patient returns in 14 days for testosterone shot

## 2023-04-24 NOTE — Progress Notes (Signed)
   Established Patient Office Visit  Subjective   Patient ID: Jonathan Yu, male    DOB: 02/19/1951  Age: 72 y.o. MRN: [id number]  Chief Complaint  Patient presents with   Hypogonadism    Nurse visit testosterone shot    HPI  Hypogonadism nurse visit testosterone shot. Patient denies chest pain, palpitations, medicine problems, dizziness.  ROS    Objective:     BP 115/68   Pulse 86   Ht 5\' 5"  (1.651 m)   SpO2 94%   BMI 41.44 kg/m    Physical Exam   No results found for any visits on 04/24/23.    The 10-year ASCVD risk score (Arnett DK, et al., 2019) is: 31.7%    Assessment & Plan:  Testosterone injection administered RUOQ IM.Patient tolerated injection without complication. Patient returns in 14 days for testosterone shot Problem List Items Addressed This Visit       Endocrine   Hypogonadism in male - Primary    Return in about 2 weeks (around 05/08/2023).    Ramona Slinger  [place], CMA

## 2023-04-25 ENCOUNTER — Other Ambulatory Visit: Payer: Self-pay

## 2023-04-25 MED ORDER — AMLODIPINE BESYLATE 5 MG PO TABS: 5.0000 mg | ORAL_TABLET | Freq: Every day | ORAL | 0 refills | Status: DC

## 2023-04-25 NOTE — Telephone Encounter (Signed)
Med refill

## 2023-04-28 ENCOUNTER — Ambulatory Visit: Payer: Medicare HMO | Admitting: Adult Health

## 2023-04-29 ENCOUNTER — Encounter (HOSPITAL_COMMUNITY): Payer: Self-pay | Admitting: Gastroenterology

## 2023-04-29 ENCOUNTER — Telehealth: Payer: Self-pay | Admitting: Family Medicine

## 2023-04-29 ENCOUNTER — Ambulatory Visit (HOSPITAL_BASED_OUTPATIENT_CLINIC_OR_DEPARTMENT_OTHER): Payer: Medicare HMO | Admitting: Certified Registered Nurse Anesthetist

## 2023-04-29 ENCOUNTER — Ambulatory Visit (HOSPITAL_COMMUNITY)
Admission: RE | Admit: 2023-04-29 | Discharge: 2023-04-29 | Disposition: A | Discharge status: Home / Self Care | Payer: Medicare HMO | Attending: Gastroenterology | Admitting: Gastroenterology

## 2023-04-29 ENCOUNTER — Encounter (HOSPITAL_COMMUNITY)
Admission: RE | Disposition: A | Discharge status: Home / Self Care | Payer: Self-pay | Source: Home / Self Care | Attending: Gastroenterology

## 2023-04-29 ENCOUNTER — Other Ambulatory Visit: Payer: Self-pay

## 2023-04-29 ENCOUNTER — Ambulatory Visit (HOSPITAL_COMMUNITY): Payer: Medicare HMO | Admitting: Certified Registered Nurse Anesthetist

## 2023-04-29 DIAGNOSIS — F17200 Nicotine dependence, unspecified, uncomplicated: Secondary | ICD-10-CM | POA: Insufficient documentation

## 2023-04-29 DIAGNOSIS — E119 Type 2 diabetes mellitus without complications: Secondary | ICD-10-CM

## 2023-04-29 DIAGNOSIS — G20A1 Parkinson's disease without dyskinesia, without mention of fluctuations: Secondary | ICD-10-CM | POA: Diagnosis not present

## 2023-04-29 DIAGNOSIS — Z7984 Long term (current) use of oral hypoglycemic drugs: Secondary | ICD-10-CM | POA: Diagnosis not present

## 2023-04-29 DIAGNOSIS — Z4659 Encounter for fitting and adjustment of other gastrointestinal appliance and device: Secondary | ICD-10-CM | POA: Diagnosis not present

## 2023-04-29 DIAGNOSIS — I10 Essential (primary) hypertension: Secondary | ICD-10-CM | POA: Diagnosis not present

## 2023-04-29 DIAGNOSIS — G4730 Sleep apnea, unspecified: Secondary | ICD-10-CM | POA: Insufficient documentation

## 2023-04-29 DIAGNOSIS — Z6841 Body Mass Index (BMI) 40.0 and over, adult: Secondary | ICD-10-CM | POA: Insufficient documentation

## 2023-04-29 DIAGNOSIS — F17290 Nicotine dependence, other tobacco product, uncomplicated: Secondary | ICD-10-CM

## 2023-04-29 DIAGNOSIS — K219 Gastro-esophageal reflux disease without esophagitis: Secondary | ICD-10-CM | POA: Diagnosis not present

## 2023-04-29 DIAGNOSIS — Z794 Long term (current) use of insulin: Secondary | ICD-10-CM | POA: Insufficient documentation

## 2023-04-29 DIAGNOSIS — T183XXA Foreign body in small intestine, initial encounter: Secondary | ICD-10-CM | POA: Diagnosis not present

## 2023-04-29 HISTORY — PX: GASTROINTESTINAL STENT REMOVAL: SHX6384

## 2023-04-29 HISTORY — PX: ESOPHAGOGASTRODUODENOSCOPY (EGD) WITH PROPOFOL: SHX5813

## 2023-04-29 LAB — GLUCOSE, CAPILLARY: Glucose-Capillary: 129 mg/dL — ABNORMAL HIGH (ref 70–99)

## 2023-04-29 SURGERY — ESOPHAGOGASTRODUODENOSCOPY (EGD) WITH PROPOFOL
Anesthesia: Monitor Anesthesia Care

## 2023-04-29 MED ORDER — LACTATED RINGERS IV SOLN
INTRAVENOUS | Status: AC | PRN
  Administered 2023-04-29: 1000 mL via INTRAVENOUS

## 2023-04-29 MED ORDER — PROPOFOL 1000 MG/100ML IV EMUL
INTRAVENOUS | Status: AC
  Filled 2023-04-29: qty 100

## 2023-04-29 MED ORDER — ONDANSETRON HCL 4 MG/2ML IJ SOLN
INTRAMUSCULAR | Status: DC | PRN
Start: 2023-04-29 — End: 2023-04-29
  Administered 2023-04-29: 4 mg via INTRAVENOUS

## 2023-04-29 MED ORDER — PROPOFOL 500 MG/50ML IV EMUL
INTRAVENOUS | Status: DC | PRN
  Administered 2023-04-29: 125 ug/kg/min via INTRAVENOUS

## 2023-04-29 MED ORDER — AMLODIPINE BESYLATE 5 MG PO TABS: 5.0000 mg | ORAL_TABLET | Freq: Every day | ORAL | 0 refills | Status: DC

## 2023-04-29 MED ORDER — LIDOCAINE 2% (20 MG/ML) 5 ML SYRINGE
INTRAMUSCULAR | Status: DC | PRN
  Administered 2023-04-29: 100 mg via INTRAVENOUS

## 2023-04-29 MED ORDER — PROPOFOL 10 MG/ML IV BOLUS
INTRAVENOUS | Status: DC | PRN
  Administered 2023-04-29: 20 mg via INTRAVENOUS
  Administered 2023-04-29: 40 mg via INTRAVENOUS

## 2023-04-29 MED ORDER — SODIUM CHLORIDE 0.9 % IV SOLN: INTRAVENOUS | Status: DC

## 2023-04-29 SURGICAL SUPPLY — 15 items

## 2023-04-29 NOTE — Op Note (Signed)
[facility] Patient Name: Jonathan Yu Procedure Date: 04/29/2023 MRN: [id number] Attending MD: Vida Rigger , MD, [id number] Date of Birth: 15-Aug-1950 CSN: [id number] Age: 72 Admit Type: Outpatient Procedure:                Upper GI endoscopy Indications:              Foreign body in the small bowel Providers:                Vida Rigger, MD, Lorenza Evangelist, RN, Harrington Challenger,                            Technician Referring MD:              Medicines:                Monitored Anesthesia Care Complications:            No immediate complications. Estimated Blood Loss:     Estimated blood loss: none. Procedure:                Pre-Anesthesia Assessment:                           - Prior to the procedure, a History and Physical                            was performed, and patient medications and                            allergies were reviewed. The patient's tolerance of                            previous anesthesia was also reviewed. The risks                            and benefits of the procedure and the sedation                            options and risks were discussed with the patient.                            All questions were answered, and informed consent                            was obtained. Prior Anticoagulants: The patient has                            taken no anticoagulant or antiplatelet agents. ASA                            Grade Assessment: III - A patient with severe                            systemic disease. After reviewing the risks and  benefits, the patient was deemed in satisfactory                            condition to undergo the procedure.                           After obtaining informed consent, the endoscope was                            passed under direct vision. Throughout the                            procedure, the patient's blood pressure, pulse, and                            oxygen  saturations were monitored continuously. The                            [id number]-[id number] ([id number]) Olympus endoscope was introduced                            through the mouth, and advanced to the second part                            of duodenum. The upper GI endoscopy was                            accomplished without difficulty. The patient                            tolerated the procedure well. Scope In: Scope Out: Findings:      The examined esophagus was normal.      The entire examined stomach was normal.      The duodenal bulb was normal.      A previously placed plastic biliary stent was seen in the ampulla. Stent       removal was accomplished with a snare.      The exam was otherwise without abnormality. Impression:               - Normal esophagus.                           - Normal stomach.                           - Normal duodenal bulb.                           - Plastic biliary stent in the duodenum. Removed.                           - The examination was otherwise normal. Moderate Sedation:      Not Applicable - Patient had care per Anesthesia. Recommendation:           - Soft diet today.                           -  Continue present medications.                           - Return to GI clinic PRN.                           - Telephone GI clinic if symptomatic PRN. Procedure Code(s):        --- Professional ---                           [phone], Esophagogastroduodenoscopy, flexible,                            transoral; with removal of foreign body(s) Diagnosis Code(s):        --- Professional ---                           Z46.59, Encounter for fitting and adjustment of                            other gastrointestinal appliance and device                           T18.3XXA, Foreign body in small intestine, initial                            encounter CPT copyright 2022 [employer]. All rights reserved. The codes documented in this report are  preliminary and upon coder review may  be revised to meet current compliance requirements. Vida Rigger, MD 04/29/2023 1:18:42 PM This report has been signed electronically. Number of Addenda: 0

## 2023-04-29 NOTE — Anesthesia Procedure Notes (Signed)
Procedure Name: MAC Date/Time: 04/29/2023 1:08 PM  Performed by: Ludwig Lean, CRNAPre-anesthesia Checklist: Patient identified, Emergency Drugs available, Suction available and Patient being monitored Patient Re-evaluated:Patient Re-evaluated prior to induction Oxygen Delivery Method: Simple face mask Preoxygenation: Pre-oxygenation with 100% oxygen Placement Confirmation: positive ETCO2 and breath sounds checked- equal and bilateral

## 2023-04-29 NOTE — Telephone Encounter (Signed)
Prescription Request  04/29/2023  LOV: 03/27/2023  What is the name of the medication or equipment? amLODipine (NORVASC) 5 MG tablet   Have you contacted your pharmacy to request a refill? Yes   Which pharmacy would you like this sent to?   [facility] - [place], [place] - [address] [place] [address] Phone: [phone] Fax: [phone]    Patient notified that their request is being sent to the clinical staff for review and that they should receive a response within 2 business days.   Please advise at [facility] [phone]

## 2023-04-29 NOTE — Discharge Instructions (Addendum)
Call if question or problem from a GI standpoint and follow-up as needed and first meal today soft solids like breakfast food and slowly advance diet today as toleratedYOU HAD AN ENDOSCOPIC PROCEDURE TODAY: Refer to the procedure report and other information in the discharge instructions given to you for any specific questions about what was found during the examination. If this information does not answer your questions, please call [facility] office at [phone] to clarify.   YOU SHOULD EXPECT: Some feelings of bloating in the abdomen. Passage of more gas than usual. Walking can help get rid of the air that was put into your GI tract during the procedure and reduce the bloating. If you had a lower endoscopy (such as a colonoscopy or flexible sigmoidoscopy) you may notice spotting of blood in your stool or on the toilet paper. Some abdominal soreness may be present for a day or two, also.  DIET: Your first meal following the procedure should be a light meal and then it is ok to progress to your normal diet. A half-sandwich or bowl of soup is an example of a good first meal. Heavy or fried foods are harder to digest and may make you feel nauseous or bloated. Drink plenty of fluids but you should avoid alcoholic beverages for 24 hours. If you had a esophageal dilation, please see attached instructions for diet.    ACTIVITY: Your care partner should take you home directly after the procedure. You should plan to take it easy, moving slowly for the rest of the day. You can resume normal activity the day after the procedure however YOU SHOULD NOT DRIVE, use power tools, machinery or perform tasks that involve climbing or major physical exertion for 24 hours (because of the sedation medicines used during the test).   SYMPTOMS TO REPORT IMMEDIATELY: A gastroenterologist can be reached at any hour. Please call [phone]  for any of the following symptoms:   Following upper endoscopy (EGD, EUS, ERCP,  esophageal dilation) Vomiting of blood or coffee ground material  New, significant abdominal pain  New, significant chest pain or pain under the shoulder blades  Painful or persistently difficult swallowing  New shortness of breath  Black, tarry-looking or red, bloody stools  FOLLOW UP:  If any biopsies were taken you will be contacted by phone or by letter within the next 1-3 weeks. Call [phone]  if you have not heard about the biopsies in 3 weeks.  Please also call with any specific questions about appointments or follow up tests. YOU HAD AN ENDOSCOPIC PROCEDURE TODAY: Refer to the procedure report and other information in the discharge instructions given to you for any specific questions about what was found during the examination. If this information does not answer your questions, please call [facility] office at [phone] to clarify.

## 2023-04-29 NOTE — Progress Notes (Signed)
Jonathan Yu 12:43 PM  Subjective: Patient doing well without any problems since we saw him in the office and we rediscussed the procedure  Objective: Vital signs stable afebrile no acute distress exam please see preassessment evaluation  Assessment: Status post ERCP with stent placement  Plan: Okay to proceed with EGD and stent removal with anesthesia assistance [facility]  office [phone] After 5PM or if no answer call [phone]

## 2023-04-29 NOTE — Telephone Encounter (Signed)
Sent 90 days supply to [facility]

## 2023-04-29 NOTE — Anesthesia Postprocedure Evaluation (Signed)
Anesthesia Post Note  Patient: [name]  Procedure(s) Performed: ESOPHAGOGASTRODUODENOSCOPY (EGD) WITH PROPOFOL GASTROINTESTINAL STENT REMOVAL     Patient location during evaluation: Endoscopy Anesthesia Type: MAC Level of consciousness: awake and alert Pain management: pain level controlled Vital Signs Assessment: post-procedure vital signs reviewed and stable Respiratory status: spontaneous breathing, nonlabored ventilation, respiratory function stable and patient connected to nasal cannula oxygen Cardiovascular status: blood pressure returned to baseline and stable Postop Assessment: no apparent nausea or vomiting Anesthetic complications: no   No notable events documented.  Last Vitals:  Vitals:   04/29/23 1340 04/29/23 1350  BP: 134/77 (!) 151/89  Pulse: 66 80  Resp: (!) 25 (!) 22  Temp:    SpO2: 93% 95%    Last Pain:  Vitals:   04/29/23 1350  TempSrc:   PainSc: 0-No pain                 Jonathan Yu

## 2023-04-29 NOTE — Anesthesia Preprocedure Evaluation (Addendum)
Anesthesia Evaluation  Patient identified by MRN, date of birth, ID band Patient awake    Reviewed: Allergy & Precautions, NPO status , Patient's Chart, lab work & pertinent test results  History of Anesthesia Complications (+) DIFFICULT AIRWAY and history of anesthetic complications (previous airway note: grade 1 view with glidescope)  Airway Mallampati: III  TM Distance: >3 FB Neck ROM: Full    Dental no notable dental hx.    Pulmonary sleep apnea and Continuous Positive Airway Pressure Ventilation , Current Smoker and Patient abstained from smoking.   Pulmonary exam normal breath sounds clear to auscultation       Cardiovascular hypertension, Pt. on medications Normal cardiovascular exam Rhythm:Regular Rate:Normal     Neuro/Psych  PSYCHIATRIC DISORDERS Anxiety     Parkinson's  Neuromuscular disease    GI/Hepatic Neg liver ROS,GERD  ,,  Endo/Other  diabetes, Type 2, Oral Hypoglycemic Agents, Insulin Dependent  Morbid obesity (BMI 41)  Renal/GU negative Renal ROS  negative genitourinary   Musculoskeletal  (+) Arthritis ,    Abdominal   Peds  Hematology negative hematology ROS (+)   Anesthesia Other Findings   Reproductive/Obstetrics                             Anesthesia Physical Anesthesia Plan  ASA: 3  Anesthesia Plan: MAC   Post-op Pain Management:    Induction: Intravenous  PONV Risk Score and Plan: Propofol infusion and Treatment may vary due to age or medical condition  Airway Management Planned: Natural Airway  Additional Equipment:   Intra-op Plan:   Post-operative Plan:   Informed Consent: I have reviewed the patients History and Physical, chart, labs and discussed the procedure including the risks, benefits and alternatives for the proposed anesthesia with the patient or authorized representative who has indicated his/her understanding and acceptance.      Dental advisory given  Plan Discussed with: CRNA  Anesthesia Plan Comments:        Anesthesia Quick Evaluation

## 2023-04-29 NOTE — Transfer of Care (Signed)
Immediate Anesthesia Transfer of Care Note  Patient: Helge Delzell  Procedure(s) Performed: Procedure(s): ESOPHAGOGASTRODUODENOSCOPY (EGD) WITH PROPOFOL (N/A) GASTROINTESTINAL STENT REMOVAL (N/A)  Patient Location: PACU  Anesthesia Type:MAC  Level of Consciousness: Patient easily awoken, sedated, comfortable, cooperative, following commands, responds to stimulation.   Airway & Oxygen Therapy: Patient spontaneously breathing, ventilating well, oxygen via simple oxygen mask.  Post-op Assessment: Report given to PACU RN, vital signs reviewed and stable, moving all extremities.   Post vital signs: Reviewed and stable.  Complications: No apparent anesthesia complications  Last Vitals:  Vitals Value Taken Time  BP    Temp    Pulse 69 04/29/23 1326  Resp 19 04/29/23 1326  SpO2 96 % 04/29/23 1326  Vitals shown include unfiled device data.  Last Pain:  Vitals:   04/29/23 1240  TempSrc: Oral  PainSc: 0-No pain         Complications: No notable events documented.

## 2023-05-02 ENCOUNTER — Encounter (HOSPITAL_COMMUNITY): Payer: Self-pay | Admitting: Gastroenterology

## 2023-05-02 DIAGNOSIS — M6281 Muscle weakness (generalized): Secondary | ICD-10-CM | POA: Diagnosis not present

## 2023-05-02 DIAGNOSIS — R5381 Other malaise: Secondary | ICD-10-CM | POA: Diagnosis not present

## 2023-05-02 DIAGNOSIS — G20C Parkinsonism, unspecified: Secondary | ICD-10-CM | POA: Diagnosis not present

## 2023-05-06 ENCOUNTER — Ambulatory Visit (INDEPENDENT_AMBULATORY_CARE_PROVIDER_SITE_OTHER): Payer: Medicare HMO | Admitting: Family Medicine

## 2023-05-06 DIAGNOSIS — Z0000 Encounter for general adult medical examination without abnormal findings: Secondary | ICD-10-CM

## 2023-05-06 NOTE — Progress Notes (Deleted)
PATIENT: Jonathan Yu DOB: May 29, 1951  REASON FOR VISIT: follow up HISTORY FROM: patient PRIMARY NEUROLOGIST: Dr. Frances Furbish  No chief complaint on file.    HISTORY OF PRESENT ILLNESS: Today 05/06/23:  Jonathan Yu is a 72 y.o. male with a history of OSA on CPAP and Parkinson's disease. Returns today for follow-up.   09/26/2022: He reports doing well with his AutoPap machine, he is compliant with treatment, he does need new supplies particularly in new mask.  He had a recent near fall, his smart watch picked up on it, he did not injure himself and fell forward but did not land on the ground.  Sometimes uses a cane, he does have discomfort in his lower back and both hips and both ankles typically.  He never increased his ropinirole from 2 mg 3 times daily to 3 mg 3 times daily.  He feels that his anxiety if anything is more of an issue, he takes buspirone 5 mg 3 times a day, sometimes a fourth dose.  Sometimes the ropinirole makes him sleepy.  He tries to stay active, he gets massage therapy from time to time.  He tries to hydrate well.    03/26/22: Jonathan Yu is a 72 year old male with a history of OSA on CPAP and Parkinson's disease. He returns today for follow-up.   OSA on CPAP: CPAP report below. Doesn't feel like he is getting enough air when he puts the machine on.   Parkinson's disease: Currently taking Requip 2 mg TID. Reports that he is having more trouble with getting his feet to work. States that when he stands he has trouble getting started. Has tried taking an extra requip and felt that it was helpful.   Feels that his tremor in the afternoon is worse.  He states that it is not every afternoon but seems to occur more in the afternoon hours.  Feels that its more related to his anxiety. Would like to take Buspar QID PRN. States that he has taken an extra tablet before and it has helped.      REVIEW OF SYSTEMS: Out of a complete 14 system review of symptoms, the patient  complains only of the following symptoms, and all other reviewed systems are negative.  ALLERGIES: Allergies  Allergen Reactions   Sesame Oil Hives and Shortness Of Breath   Clonazepam Hives    Tolerates Ativan   Soy Allergy Hives   Tape Other (See Comments)    White surgical tape caused some blisters.   Percocet [Oxycodone-Acetaminophen] Rash   Topamax [Topiramate] Other (See Comments)    Cognitive decline   Vicodin [Hydrocodone-Acetaminophen] Rash    HOME MEDICATIONS: Outpatient Medications Prior to Visit  Medication Sig Dispense Refill   albuterol (PROVENTIL HFA;VENTOLIN HFA) 108 (90 Base) MCG/ACT inhaler Inhale 2 puffs into the lungs every 6 (six) hours as needed for wheezing or shortness of breath. 1 Inhaler 0   AMBULATORY NON FORMULARY MEDICATION Continue CPAP at current settings.  Please provide new supplies. [name] phone number [phone] 1 each 0   AMBULATORY NON FORMULARY MEDICATION Compression stockings.  Vive size LGM Leg swelling. Disp 1 pair 99 refil. 1 each 99   AMBULATORY NON FORMULARY MEDICATION Please provide:  12 inch grab bar x2 Trapeze bar to assist with getting out of bed.  Dx: R53.81, Z90.49, K83.9 1 Units 0   amLODipine (NORVASC) 5 MG tablet Take 1 tablet (5 mg total) by mouth daily. 90 tablet 0   anastrozole (ARIMIDEX)  1 MG tablet Take 1 tablet (1 mg total) by mouth every Monday, Wednesday, and Friday. 90 tablet 1   atorvastatin (LIPITOR) 40 MG tablet TAKE 1 TABLET EVERY DAY (Patient taking differently: Take 40 mg by mouth daily.) 90 tablet 3   Azelaic Acid 15 % cream Apply 1 application topically 2 (two) times daily. (Patient taking differently: Apply 1 application  topically 2 (two) times daily as needed (flares).) 50 g 12   busPIRone (BUSPAR) 5 MG tablet Take 1 tablet (5 mg total) by mouth 3 (three) times daily as needed. May take an optional 4th dose in a day if needed. 270 tablet 0   Carbidopa-Levodopa ER (SINEMET CR) 25-100 MG tablet  controlled release Take 1 tablet by mouth daily. Dose will increase by 1 tablet each week until taking 3 times daily     fluticasone (FLONASE) 50 MCG/ACT nasal spray Place 1 spray into both nostrils as needed for allergies.     furosemide (LASIX) 20 MG tablet TAKE 1 TABLET BY MOUTH ONCE DAILY AS NEEDED FOR EDEMA 30 tablet 0   glipiZIDE (GLUCOTROL) 5 MG tablet TAKE 1 TABLET TWICE DAILY BEFORE MEALS 180 tablet 3   LINZESS 145 MCG CAPS capsule Take 145 mcg by mouth daily.     loratadine (CLARITIN) 10 MG tablet Take 10 mg by mouth daily as needed for allergies.     losartan (COZAAR) 50 MG tablet TAKE 1 TABLET EVERY DAY 90 tablet 3   metFORMIN (GLUCOPHAGE-XR) 500 MG 24 hr tablet Take 1 tablet (500 mg total) by mouth 2 (two) times daily with a meal. 180 tablet 1   Misc. Devices (FLEX THERAPY) MISC by Does not apply route.     mometasone (ELOCON) 0.1 % cream Apply 1 Application topically daily. 45 g 0   Multiple Vitamin (MULTIVITAMIN WITH MINERALS) TABS tablet Take 1 tablet by mouth daily.     ondansetron (ZOFRAN) 4 MG tablet Take 1 tablet (4 mg total) by mouth every 6 (six) hours as needed for nausea. 20 tablet 0   Probiotic Product (PROBIOTIC PO) Take 2 capsules by mouth daily.     rOPINIRole (REQUIP) 2 MG tablet TAKE 1 TABLET THREE TIMES DAILY 270 tablet 3   tamsulosin (FLOMAX) 0.4 MG CAPS capsule Take 2 capsules (0.8 mg total) by mouth daily. 180 capsule 3   traMADol (ULTRAM) 50 MG tablet TAKE 1 TABLET BY MOUTH EVERY 8 HOURS AS NEEDED FOR MODERATE PAIN 10 tablet 0   triamcinolone cream (KENALOG) 0.1 % Apply 1 Application topically as needed (flares).     VITAMIN D PO Take 2,000 Units by mouth daily.     Facility-Administered Medications Prior to Visit  Medication Dose Route Frequency Provider Last Rate Last Admin   testosterone cypionate (DEPOTESTOSTERONE CYPIONATE) injection 200 mg  200 mg Intramuscular Q14 Days Everrett Coombe, DO   200 mg at 04/24/23 1051    PAST MEDICAL HISTORY: Past Medical  History:  Diagnosis Date   Allergy    Anxiety    Asthma    Cataract    DDD (degenerative disc disease), lumbar    GERD (gastroesophageal reflux disease)    Hyperlipidemia    "no meds since losing 106# 2 yr ago" (10/06/2013)   Hypertension 08/12/2010   "no meds since losing 106# 2 yr ago" (10/06/2013)   Neuromuscular disorder (HCC)    OSA on CPAP    "don't wear mask much since losing 106# 2 yr ago" (10/06/2013)   Parkinson's disease  Sleep apnea    Type II diabetes mellitus (HCC)    "no meds since losing 106# 2 yr ago" (10/06/2013)   Walking pneumonia 08/12/1966    PAST SURGICAL HISTORY: Past Surgical History:  Procedure Laterality Date   ANKLE FRACTURE SURGERY Left 08/13/1971   ANKLE RECONSTRUCTION Right 05/13/2011   BILIARY STENT PLACEMENT N/A 03/18/2023   Procedure: BILIARY STENT PLACEMENT;  Surgeon: Vida Rigger, MD;  Location: [facility];  Service: Gastroenterology;  Laterality: N/A;   CARPAL TUNNEL RELEASE Bilateral 08/13/1999   CHOLECYSTECTOMY N/A 03/14/2023   Procedure: LAPAROSCOPIC SUBTOTAL CHOLECYSTECTOMY WITH ICG DYE;  Surgeon: Andria Meuse, MD;  Location: [facility];  Service: General;  Laterality: N/A;   ERCP N/A 03/18/2023   Procedure: ENDOSCOPIC RETROGRADE CHOLANGIOPANCREATOGRAPHY (ERCP);  Surgeon: Vida Rigger, MD;  Location: [facility];  Service: Gastroenterology;  Laterality: N/A;   ESOPHAGOGASTRODUODENOSCOPY (EGD) WITH PROPOFOL N/A 04/29/2023   Procedure: ESOPHAGOGASTRODUODENOSCOPY (EGD) WITH PROPOFOL;  Surgeon: Vida Rigger, MD;  Location: [facility];  Service: Gastroenterology;  Laterality: N/A;   FRACTURE SURGERY     GASTROINTESTINAL STENT REMOVAL N/A 04/29/2023   Procedure: GASTROINTESTINAL STENT REMOVAL;  Surgeon: Vida Rigger, MD;  Location: [facility];  Service: Gastroenterology;  Laterality: N/A;   HARDWARE REMOVAL Right 10/07/2013   Procedure: RIGHT ANKLE HARDWARE REMOVAL;  Surgeon: Nadara Mustard, MD;  Location: [facility];  Service: Orthopedics;   Laterality: Right;   I & D EXTREMITY Right 11/18/2013   Procedure: IRRIGATION AND DEBRIDEMENT EXTREMITY;  Surgeon: Nadara Mustard, MD;  Location: [facility];  Service: Orthopedics;  Laterality: Right;  Irrigation and Debridement Right Fibula , Place Antibiotic Beads and VAC   JOINT REPLACEMENT     NASAL SEPTUM SURGERY  08/12/1980   ORIF ANKLE FRACTURE Right 09/08/2013   Procedure: REPAIR SYNDESMOSIS DISRUPTION RIGHT ANKLE;  Surgeon: Nadara Mustard, MD;  Location: [facility];  Service: Orthopedics;  Laterality: Right;   SHOULDER ARTHROSCOPY W/ ROTATOR CUFF REPAIR Right 2006; 2009   SPHINCTEROTOMY  03/18/2023   Procedure: SPHINCTEROTOMY;  Surgeon: Vida Rigger, MD;  Location: [facility];  Service: Gastroenterology;;   TONSILLECTOMY  08/12/1957   TOTAL ANKLE ARTHROPLASTY Right 08/18/2013   Procedure: TOTAL ANKLE ARTHOPLASTY;  Surgeon: Nadara Mustard, MD;  Location: [facility];  Service: Orthopedics;  Laterality: Right;  Right Total Ankle Arthroplasty, Revision Fibular Fracture   URETERAL STENT PLACEMENT  ~ 2010 X 2    FAMILY HISTORY: Family History  Problem Relation Age of Onset   Cancer Mother 27       Lung   Alzheimer's disease Father    Parkinson's disease Father    Early death Sister    Scoliosis Sister    Cleft lip Sister     SOCIAL HISTORY: Social History   Socioeconomic History   Marital status: Divorced    Spouse name: Not on file   Number of children: 6   Years of education: 14   Highest education level: Some college, no degree  Occupational History   Occupation: sales/ DJ    Comment: part time  Tobacco Use   Smoking status: Some Days    Types: Pipe, Cigars   Smokeless tobacco: Never   Tobacco comments:    Just a few cigars per year  Vaping Use   Vaping status: Never Used  Substance and Sexual Activity   Alcohol use: Not Currently    Comment: 1-2 drinks per year.   Drug use: No   Sexual activity: Not Currently    Birth control/protection:  Abstinence  Other Topics Concern    Not on file  Social History Narrative   Lives with his son and his daughter. He does Hotel manager, enjoys singing, music and yard work (when he can).    Social Determinants of Health   Financial Resource Strain: Low Risk  (05/05/2023)   Overall Financial Resource Strain (CARDIA)    Difficulty of Paying Living Expenses: Not very hard  Food Insecurity: No Food Insecurity (05/05/2023)   Hunger Vital Sign    Worried About Running Out of Food in the Last Year: Never true    Ran Out of Food in the Last Year: Never true  Transportation Needs: No Transportation Needs (05/05/2023)   PRAPARE - Administrator, Civil Service (Medical): No    Lack of Transportation (Non-Medical): No  Physical Activity: Inactive (05/05/2023)   Exercise Vital Sign    Days of Exercise per Week: 0 days    Minutes of Exercise per Session: 0 min  Stress: Stress Concern Present (05/05/2023)   [employer] of Occupational Health - Occupational Stress Questionnaire    Feeling of Stress : Rather much  Social Connections: Moderately Isolated (05/06/2023)   Social Connection and Isolation Panel [NHANES]    Frequency of Communication with Friends and Family: More than three times a week    Frequency of Social Gatherings with Friends and Family: More than three times a week    Attends Religious Services: More than 4 times per year    Active Member of [employer] or Organizations: No    Attends Banker Meetings: Never    Marital Status: Divorced  Catering manager Violence: Not At Risk (05/06/2023)   Humiliation, Afraid, Rape, and Kick questionnaire    Fear of Current or Ex-Partner: No    Emotionally Abused: No    Physically Abused: No    Sexually Abused: No      PHYSICAL EXAM  There were no vitals filed for this visit.  There is no height or weight on file to calculate BMI.  Generalized: Well developed, in no acute distress  Chest: Lungs clear to auscultation bilaterally  Neurological examination   Mentation: Alert oriented to time, place, history taking. Follows all commands speech and language fluent Cranial nerve II-XII: Extraocular movements were full, visual field were full on confrontational test Head turning and shoulder shrug  were normal and symmetric. Motor: The motor testing reveals 5 over 5 strength of all 4 extremities. Good symmetric motor tone is noted throughout.  Finger taps mild impairment.  Toe taps moderate impairment. Sensory: Sensory testing is intact to soft touch on all 4 extremities. No evidence of extinction is noted.  Gait and station: Uses the chair to stand up.  Has a cane when ambulating.   DIAGNOSTIC DATA (LABS, IMAGING, TESTING) - I reviewed patient records, labs, notes, testing and imaging myself where available.  Lab Results  Component Value Date   WBC 8.0 04/10/2023   HGB 13.6 04/10/2023   HCT 40.5 04/10/2023   MCV 87 04/10/2023   PLT 277 04/10/2023      Component Value Date/Time   NA 140 04/10/2023 0914   K 4.4 04/10/2023 0914   CL 103 04/10/2023 0914   CO2 23 04/10/2023 0914   GLUCOSE 112 (H) 04/10/2023 0914   GLUCOSE 103 (H) 03/24/2023 0729   BUN 12 04/10/2023 0914   CREATININE 0.97 04/10/2023 0914   CREATININE 0.88 07/18/2022 1333   CALCIUM 9.6 04/10/2023 0914   PROT 6.7 04/10/2023 0914  ALBUMIN 3.9 04/10/2023 0914   AST 13 04/10/2023 0914   ALT 17 04/10/2023 0914   ALKPHOS 84 04/10/2023 0914   BILITOT 1.2 04/10/2023 0914   GFRNONAA >60 03/24/2023 0729   GFRNONAA 75 03/16/2020 1038   GFRAA 87 03/16/2020 1038   Lab Results  Component Value Date   CHOL 131 07/18/2022   HDL 46 07/18/2022   LDLCALC 64 07/18/2022   TRIG 127 07/18/2022   CHOLHDL 2.8 07/18/2022   Lab Results  Component Value Date   HGBA1C 6.7 (H) 03/13/2023   Lab Results  Component Value Date   VITAMINB12 407 03/16/2020   Lab Results  Component Value Date   TSH 3.21 03/16/2020      ASSESSMENT AND PLAN 72 y.o. year old male  has a past medical  history of Allergy, Anxiety, Asthma, Cataract, DDD (degenerative disc disease), lumbar, GERD (gastroesophageal reflux disease), Hyperlipidemia, Hypertension (08/12/2010), Neuromuscular disorder (HCC), OSA on CPAP, Parkinson's disease, Sleep apnea, Type II diabetes mellitus (HCC), and Walking pneumonia (08/12/1966). here with:  OSA on CPAP  - CPAP compliance excellent -Residual AHI slightly elevated change pressure to 7-15 cmH2O and ramp pressure to 7 cm of water - Encourage patient to use CPAP nightly and > 4 hours each night   2.  Parkinson's disease  -Change Requip to 3 mg 3 times a day -Increase BuSpar to 5 mg up to 4 times a day if needed  Advised if symptoms worsen or he develops new symptoms he should let us know.  Follow-up in 6 months or sooner if needed   Butch Penny, MSN, NP-C 05/06/2023, 3:16 PM [facility] [address] [place], [place] [address] [phone]

## 2023-05-06 NOTE — Patient Instructions (Addendum)
MEDICARE ANNUAL WELLNESS VISIT Health Maintenance Summary and Written Plan of Care  Jonathan Yu ,  Thank you for allowing me to perform your Medicare Annual Wellness Visit and for your ongoing commitment to your health.   Health Maintenance & Immunization History Health Maintenance  Topic Date Due   OPHTHALMOLOGY EXAM  05/06/2023 (Originally 01/11/2023)   INFLUENZA VACCINE  05/07/2023 (Originally 03/13/2023)   COVID-19 Vaccine (3 - Pfizer risk series) 05/22/2023 (Originally 12/08/2019)   HEMOGLOBIN A1C  09/13/2023   Diabetic kidney evaluation - Urine ACR  12/26/2023   FOOT EXAM  12/26/2023   Diabetic kidney evaluation - eGFR measurement  04/09/2024   Medicare Annual Wellness (AWV)  05/05/2024   Colonoscopy  06/03/2029   DTaP/Tdap/Td (3 - Td or Tdap) 05/11/2031   Pneumonia Vaccine 50+ Years old  Completed   Hepatitis C Screening  Completed   Zoster Vaccines- Shingrix  Completed   HPV VACCINES  Aged Out   Immunization History  Administered Date(s) Administered   Fluad Quad(high Dose 65+) 04/13/2020, 05/31/2021   Influenza Split 07/16/2012   Influenza Whole 04/25/2011   Influenza, High Dose Seasonal PF 05/07/2022   Influenza,inj,Quad PF,6+ Mos 03/23/2014, 06/10/2014, 09/29/2015, 03/28/2016, 04/16/2017, 04/30/2018, 04/22/2019   Influenza-Unspecified 05/12/2013   PFIZER(Purple Top)SARS-COV-2 Vaccination 10/15/2019, 11/10/2019   Pneumococcal Conjugate-13 06/16/2009   Pneumococcal Polysaccharide-23 08/14/2018   Tdap 04/25/2011, 05/10/2021   Zoster Recombinant(Shingrix) 05/10/2021, 03/11/2022   Zoster, Live 10/11/2014    These are the patient goals that we discussed:  Goals Addressed               This Visit's Progress     Patient Stated (pt-stated)        Patient stated that he would like to get back to 197 lbs.         This is a list of Health Maintenance Items that are overdue or due now: Influenza vaccine    Orders/Referrals Placed Today: No orders of the defined  types were placed in this encounter.  (Contact our referral department at [phone] if you have not spoken with someone about your referral appointment within the next 5 days)    Follow-up Plan Follow-up with Everrett Coombe, DO as planned Patient will have diabetic eye exam records faxed over. Medicare wellness visit in one year.  Patient will access AVS on my chart.      Health Maintenance, Male Adopting a healthy lifestyle and getting preventive care are important in promoting health and wellness. Ask your health care provider about: The right schedule for you to have regular tests and exams. Things you can do on your own to prevent diseases and keep yourself healthy. What should I know about diet, weight, and exercise? Eat a healthy diet  Eat a diet that includes plenty of vegetables, fruits, low-fat dairy products, and lean protein. Do not eat a lot of foods that are high in solid fats, added sugars, or sodium. Maintain a healthy weight Body mass index (BMI) is a measurement that can be used to identify possible weight problems. It estimates body fat based on height and weight. Your health care provider can help determine your BMI and help you achieve or maintain a healthy weight. Get regular exercise Get regular exercise. This is one of the most important things you can do for your health. Most adults should: Exercise for at least 150 minutes each week. The exercise should increase your heart rate and make you sweat (moderate-intensity exercise). Do strengthening exercises at least twice a  week. This is in addition to the moderate-intensity exercise. Spend less time sitting. Even light physical activity can be beneficial. Watch cholesterol and blood lipids Have your blood tested for lipids and cholesterol at 70 years of age, then have this test every 5 years. You may need to have your cholesterol levels checked more often if: Your lipid or cholesterol levels are high. You  are older than 70 years of age. You are at high risk for heart disease. What should I know about cancer screening? Many types of cancers can be detected early and may often be prevented. Depending on your health history and family history, you may need to have cancer screening at various ages. This may include screening for: Colorectal cancer. Prostate cancer. Skin cancer. Lung cancer. What should I know about heart disease, diabetes, and high blood pressure? Blood pressure and heart disease High blood pressure causes heart disease and increases the risk of stroke. This is more likely to develop in people who have high blood pressure readings or are overweight. Talk with your health care provider about your target blood pressure readings. Have your blood pressure checked: Every 3-5 years if you are 18-85 years of age. Every year if you are 21 years old or older. If you are between the ages of 16 and 92 and are a current or former smoker, ask your health care provider if you should have a one-time screening for abdominal aortic aneurysm (AAA). Diabetes Have regular diabetes screenings. This checks your fasting blood sugar level. Have the screening done: Once every three years after age 37 if you are at a normal weight and have a low risk for diabetes. More often and at a younger age if you are overweight or have a high risk for diabetes. What should I know about preventing infection? Hepatitis B If you have a higher risk for hepatitis B, you should be screened for this virus. Talk with your health care provider to find out if you are at risk for hepatitis B infection. Hepatitis C Blood testing is recommended for: Everyone born from [birth year] through 1963. Anyone with known risk factors for hepatitis C. Sexually transmitted infections (STIs) You should be screened each year for STIs, including gonorrhea and chlamydia, if: You are sexually active and are younger than 70 years of age. You are  older than 70 years of age and your health care provider tells you that you are at risk for this type of infection. Your sexual activity has changed since you were last screened, and you are at increased risk for chlamydia or gonorrhea. Ask your health care provider if you are at risk. Ask your health care provider about whether you are at high risk for HIV. Your health care provider may recommend a prescription medicine to help prevent HIV infection. If you choose to take medicine to prevent HIV, you should first get tested for HIV. You should then be tested every 3 months for as long as you are taking the medicine. Follow these instructions at home: Alcohol use Do not drink alcohol if your health care provider tells you not to drink. If you drink alcohol: Limit how much you have to 0-2 drinks a day. Know how much alcohol is in your drink. In the [place] one drink equals one 12 oz bottle of beer (355 mL), one 5 oz glass of wine (148 mL), or one 1 oz glass of hard liquor (44 mL). Lifestyle Do not use any products that contain nicotine or tobacco.  These products include cigarettes, chewing tobacco, and vaping devices, such as e-cigarettes. If you need help quitting, ask your health care provider. Do not use street drugs. Do not share needles. Ask your health care provider for help if you need support or information about quitting drugs. General instructions Schedule regular health, dental, and eye exams. Stay current with your vaccines. Tell your health care provider if: You often feel depressed. You have ever been abused or do not feel safe at home. Summary Adopting a healthy lifestyle and getting preventive care are important in promoting health and wellness. Follow your health care provider's instructions about healthy diet, exercising, and getting tested or screened for diseases. Follow your health care provider's instructions on monitoring your cholesterol and blood pressure. This  information is not intended to replace advice given to you by your health care provider. Make sure you discuss any questions you have with your health care provider. Document Revised: 12/18/2020 Document Reviewed: 12/18/2020 Elsevier Patient Education  2024 [employer].

## 2023-05-06 NOTE — Progress Notes (Signed)
MEDICARE ANNUAL WELLNESS VISIT  05/06/2023  Telephone Visit Disclaimer This Medicare AWV was conducted by telephone due to national recommendations for restrictions regarding the COVID-19 Pandemic (e.g. social distancing).  I verified, using two identifiers, that I am speaking with Jonathan Yu or their authorized healthcare agent. I discussed the limitations, risks, security, and privacy concerns of performing an evaluation and management service by telephone and the potential availability of an in-person appointment in the future. The patient expressed understanding and agreed to proceed.  Location of Patient: Home Location of Provider (nurse):  In the office.  Subjective:    Jonathan Yu is a 72 y.o. male patient of Everrett Coombe, DO who had a Medicare Annual Wellness Visit today via telephone. Jonathan Yu is working part-time and lives with their son and daughter. he has 6 children. he reports that he is socially active and does interact with friends/family regularly. he is moderately physically active and enjoys Edwena Felty, enjoys singing, music and yard work (when he can).   Patient Care Team: Everrett Coombe, DO as PCP - General (Family Medicine) Odella Aquas, MD as Referring Physician (Urology) Nadara Mustard, MD as Consulting Physician (Orthopedic Surgery) Raynald Blend, OD (Optometry)     05/06/2023    1:39 PM 04/29/2023   12:36 PM 03/20/2023    5:30 PM 03/18/2023   12:21 PM 03/14/2023    1:34 PM 03/12/2023    7:39 AM 05/03/2022   12:56 PM  Advanced Directives  Does Patient Have a Medical Advance Directive? Yes No No No No No No  Type of Advance Directive Living will        Does patient want to make changes to medical advance directive? No - Patient declined        Would patient like information on creating a medical advance directive?  No - Patient declined No - Patient declined No - Patient declined No - Patient declined No - Patient declined No - Patient declined     Hospital Utilization Over the Past 12 Months: # of hospitalizations or ER visits: 1 # of surgeries: 2  Review of Systems    Patient reports that his overall health is better compared to last year.  History obtained from chart review and the patient  Patient Reported Readings (BP, Pulse, CBG, Weight, etc) none Per patient no change in vitals since last visit, unable to obtain new vitals due to telehealth visit  Pain Assessment Pain : No/denies pain     Current Medications & Allergies (verified) Allergies as of 05/06/2023       Reactions   Sesame Oil Hives, Shortness Of Breath   Clonazepam Hives   Tolerates Ativan   Soy Allergy Hives   Tape Other (See Comments)   White surgical tape caused some blisters.   Percocet [oxycodone-acetaminophen] Rash   Topamax [topiramate] Other (See Comments)   Cognitive decline   Vicodin [hydrocodone-acetaminophen] Rash        Medication List        Accurate as of May 06, 2023  1:57 PM. If you have any questions, ask your nurse or doctor.          albuterol 108 (90 Base) MCG/ACT inhaler Commonly known as: VENTOLIN HFA Inhale 2 puffs into the lungs every 6 (six) hours as needed for wheezing or shortness of breath.   AMBULATORY NON FORMULARY MEDICATION Continue CPAP at current settings.  Please provide new supplies. [name] phone number [phone]   AMBULATORY NON FORMULARY MEDICATION Compression  stockings.  Vive size LGM Leg swelling. Disp 1 pair 99 refil.   AMBULATORY NON FORMULARY MEDICATION Please provide:  12 inch grab bar x2 Trapeze bar to assist with getting out of bed.  Dx: R53.81, Z90.49, K83.9   amLODipine 5 MG tablet Commonly known as: NORVASC Take 1 tablet (5 mg total) by mouth daily.   anastrozole 1 MG tablet Commonly known as: ARIMIDEX Take 1 tablet (1 mg total) by mouth every Monday, Wednesday, and Friday.   atorvastatin 40 MG tablet Commonly known as: LIPITOR TAKE 1 TABLET EVERY  DAY   Azelaic Acid 15 % gel Apply 1 application topically 2 (two) times daily. What changed:  when to take this reasons to take this   busPIRone 5 MG tablet Commonly known as: BUSPAR Take 1 tablet (5 mg total) by mouth 3 (three) times daily as needed. May take an optional 4th dose in a day if needed.   Carbidopa-Levodopa ER 25-100 MG tablet controlled release Commonly known as: SINEMET CR Take 1 tablet by mouth daily. Dose will increase by 1 tablet each week until taking 3 times daily   Flex Therapy Misc by Does not apply route.   fluticasone 50 MCG/ACT nasal spray Commonly known as: FLONASE Place 1 spray into both nostrils as needed for allergies.   furosemide 20 MG tablet Commonly known as: LASIX TAKE 1 TABLET BY MOUTH ONCE DAILY AS NEEDED FOR EDEMA   glipiZIDE 5 MG tablet Commonly known as: GLUCOTROL TAKE 1 TABLET TWICE DAILY BEFORE MEALS   Linzess 145 MCG Caps capsule Generic drug: linaclotide Take 145 mcg by mouth daily.   loratadine 10 MG tablet Commonly known as: CLARITIN Take 10 mg by mouth daily as needed for allergies.   losartan 50 MG tablet Commonly known as: COZAAR TAKE 1 TABLET EVERY DAY   metFORMIN 500 MG 24 hr tablet Commonly known as: GLUCOPHAGE-XR Take 1 tablet (500 mg total) by mouth 2 (two) times daily with a meal.   mometasone 0.1 % cream Commonly known as: ELOCON Apply 1 Application topically daily.   multivitamin with minerals Tabs tablet Take 1 tablet by mouth daily.   ondansetron 4 MG tablet Commonly known as: ZOFRAN Take 1 tablet (4 mg total) by mouth every 6 (six) hours as needed for nausea.   PROBIOTIC PO Take 2 capsules by mouth daily.   rOPINIRole 2 MG tablet Commonly known as: REQUIP TAKE 1 TABLET THREE TIMES DAILY   tamsulosin 0.4 MG Caps capsule Commonly known as: FLOMAX Take 2 capsules (0.8 mg total) by mouth daily.   traMADol 50 MG tablet Commonly known as: ULTRAM TAKE 1 TABLET BY MOUTH EVERY 8 HOURS AS NEEDED  FOR MODERATE PAIN   triamcinolone cream 0.1 % Commonly known as: KENALOG Apply 1 Application topically as needed (flares).   VITAMIN D PO Take 2,000 Units by mouth daily.        History (reviewed): Past Medical History:  Diagnosis Date   Allergy    Anxiety    Asthma    Cataract    DDD (degenerative disc disease), lumbar    GERD (gastroesophageal reflux disease)    Hyperlipidemia    "no meds since losing 106# 2 yr ago" (10/06/2013)   Hypertension 08/12/2010   "no meds since losing 106# 2 yr ago" (10/06/2013)   Neuromuscular disorder (HCC)    OSA on CPAP    "don't wear mask much since losing 106# 2 yr ago" (10/06/2013)   Parkinson's disease    Sleep  apnea    Type II diabetes mellitus (HCC)    "no meds since losing 106# 2 yr ago" (10/06/2013)   Walking pneumonia 08/12/1966   Past Surgical History:  Procedure Laterality Date   ANKLE FRACTURE SURGERY Left 08/13/1971   ANKLE RECONSTRUCTION Right 05/13/2011   BILIARY STENT PLACEMENT N/A 03/18/2023   Procedure: BILIARY STENT PLACEMENT;  Surgeon: Vida Rigger, MD;  Location: [facility];  Service: Gastroenterology;  Laterality: N/A;   CARPAL TUNNEL RELEASE Bilateral 08/13/1999   CHOLECYSTECTOMY N/A 03/14/2023   Procedure: LAPAROSCOPIC SUBTOTAL CHOLECYSTECTOMY WITH ICG DYE;  Surgeon: Andria Meuse, MD;  Location: [facility];  Service: General;  Laterality: N/A;   ERCP N/A 03/18/2023   Procedure: ENDOSCOPIC RETROGRADE CHOLANGIOPANCREATOGRAPHY (ERCP);  Surgeon: Vida Rigger, MD;  Location: [facility];  Service: Gastroenterology;  Laterality: N/A;   ESOPHAGOGASTRODUODENOSCOPY (EGD) WITH PROPOFOL N/A 04/29/2023   Procedure: ESOPHAGOGASTRODUODENOSCOPY (EGD) WITH PROPOFOL;  Surgeon: Vida Rigger, MD;  Location: [facility];  Service: Gastroenterology;  Laterality: N/A;   FRACTURE SURGERY     GASTROINTESTINAL STENT REMOVAL N/A 04/29/2023   Procedure: GASTROINTESTINAL STENT REMOVAL;  Surgeon: Vida Rigger, MD;  Location: [facility];   Service: Gastroenterology;  Laterality: N/A;   HARDWARE REMOVAL Right 10/07/2013   Procedure: RIGHT ANKLE HARDWARE REMOVAL;  Surgeon: Nadara Mustard, MD;  Location: [facility];  Service: Orthopedics;  Laterality: Right;   I & D EXTREMITY Right 11/18/2013   Procedure: IRRIGATION AND DEBRIDEMENT EXTREMITY;  Surgeon: Nadara Mustard, MD;  Location: [facility];  Service: Orthopedics;  Laterality: Right;  Irrigation and Debridement Right Fibula , Place Antibiotic Beads and VAC   JOINT REPLACEMENT     NASAL SEPTUM SURGERY  08/12/1980   ORIF ANKLE FRACTURE Right 09/08/2013   Procedure: REPAIR SYNDESMOSIS DISRUPTION RIGHT ANKLE;  Surgeon: Nadara Mustard, MD;  Location: [facility];  Service: Orthopedics;  Laterality: Right;   SHOULDER ARTHROSCOPY W/ ROTATOR CUFF REPAIR Right 2006; 2009   SPHINCTEROTOMY  03/18/2023   Procedure: SPHINCTEROTOMY;  Surgeon: Vida Rigger, MD;  Location: [facility];  Service: Gastroenterology;;   TONSILLECTOMY  08/12/1957   TOTAL ANKLE ARTHROPLASTY Right 08/18/2013   Procedure: TOTAL ANKLE ARTHOPLASTY;  Surgeon: Nadara Mustard, MD;  Location: [facility];  Service: Orthopedics;  Laterality: Right;  Right Total Ankle Arthroplasty, Revision Fibular Fracture   URETERAL STENT PLACEMENT  ~ 2010 X 2   Family History  Problem Relation Age of Onset   Cancer Mother 72       Lung   Alzheimer's disease Father    Parkinson's disease Father    Early death Sister    Scoliosis Sister    Cleft lip Sister    Social History   Socioeconomic History   Marital status: Divorced    Spouse name: Not on file   Number of children: 6   Years of education: 14   Highest education level: Some college, no degree  Occupational History   Occupation: sales/ DJ    Comment: part time  Tobacco Use   Smoking status: Some Days    Types: Pipe, Cigars   Smokeless tobacco: Never   Tobacco comments:    Just a few cigars per year  Vaping Use   Vaping status: Never Used  Substance and Sexual Activity   Alcohol use: Not  Currently    Comment: 1-2 drinks per year.   Drug use: No   Sexual activity: Not Currently    Birth control/protection: Abstinence  Other Topics Concern   Not on file  Social History Narrative   Lives with his son and his daughter. He does Hotel manager, enjoys singing, music and yard work (when he can).    Social Determinants of Health   Financial Resource Strain: Low Risk  (05/05/2023)   Overall Financial Resource Strain (CARDIA)    Difficulty of Paying Living Expenses: Not very hard  Food Insecurity: No Food Insecurity (05/05/2023)   Hunger Vital Sign    Worried About Running Out of Food in the Last Year: Never true    Ran Out of Food in the Last Year: Never true  Transportation Needs: No Transportation Needs (05/05/2023)   PRAPARE - Administrator, Civil Service (Medical): No    Lack of Transportation (Non-Medical): No  Physical Activity: Inactive (05/05/2023)   Exercise Vital Sign    Days of Exercise per Week: 0 days    Minutes of Exercise per Session: 0 min  Stress: Stress Concern Present (05/05/2023)   [employer] of Occupational Health - Occupational Stress Questionnaire    Feeling of Stress : Rather much  Social Connections: Moderately Isolated (05/06/2023)   Social Connection and Isolation Panel [NHANES]    Frequency of Communication with Friends and Family: More than three times a week    Frequency of Social Gatherings with Friends and Family: More than three times a week    Attends Religious Services: More than 4 times per year    Active Member of [employer] or Organizations: No    Attends Banker Meetings: Never    Marital Status: Divorced    Activities of Daily Living    05/05/2023    2:50 PM 03/20/2023    5:30 PM  In your present state of health, do you have any difficulty performing the following activities:  Hearing? 0 0  Vision? 0 0  Difficulty concentrating or making decisions? 0 0  Walking or climbing stairs? 1 1  Dressing or bathing? 1 0   Doing errands, shopping? 1 1  Preparing Food and eating ? Y   Using the Toilet? Y   In the past six months, have you accidently leaked urine? Y   Do you have problems with loss of bowel control? Y   Managing your Medications? N   Managing your Finances? N   Housekeeping or managing your Housekeeping? Y     Patient Education/ Literacy How often do you need to have someone help you when you read instructions, pamphlets, or other written materials from your doctor or pharmacy?: 2 - Rarely What is the last grade level you completed in school?: 12th grade  Exercise    Diet Patient reports consuming  2-3  meals a day and 2 snack(s) a day Patient reports that his primary diet is: Regular Patient reports that she does have regular access to food.   Depression Screen    05/06/2023    1:43 PM 03/27/2023   10:35 AM 12/26/2022    9:53 AM 05/03/2022    1:02 PM 10/04/2021    9:40 AM 04/23/2021   10:25 AM 01/29/2021    2:23 PM  PHQ 2/9 Scores  PHQ - 2 Score 0 3 0 0 1 0 0  PHQ- 9 Score 5 6          Fall Risk    05/06/2023    1:42 PM 05/05/2023    2:50 PM 12/26/2022    9:53 AM 07/25/2022    9:39 AM 05/03/2022    1:02 PM  Fall Risk  Falls in the past year? 1 1 0 0 1  Number falls in past yr: 1 1 0 0 1  Injury with Fall? 0 0 0 0 0  Risk for fall due to : History of fall(s)  No Fall Risks No Fall Risks History of fall(s);Impaired mobility;Impaired balance/gait  Follow up Falls evaluation completed  Falls evaluation completed Falls evaluation completed Falls evaluation completed;Education provided;Falls prevention discussed     Objective:  Jonathan Yu seemed alert and oriented and he participated appropriately during our telephone visit.  Blood Pressure Weight BMI  BP Readings from Last 3 Encounters:  04/29/23 (!) 151/89  04/24/23 115/68  04/10/23 111/68   Wt Readings from Last 3 Encounters:  04/29/23 251 lb (113.9 kg)  04/10/23 247 lb 3 oz (112.1 kg)  03/27/23 252 lb 4.8 oz  (114.4 kg)   BMI Readings from Last 1 Encounters:  04/29/23 40.51 kg/m    *Unable to obtain current vital signs, weight, and BMI due to telephone visit type  Hearing/Vision  Jonathan Yu did not seem to have difficulty with hearing/understanding during the telephone conversation Reports that he has had a formal eye exam by an eye care professional within the past year Reports that he has not had a formal hearing evaluation within the past year *Unable to fully assess hearing and vision during telephone visit type  Cognitive Function:    05/06/2023    1:46 PM 05/03/2022    1:07 PM 04/23/2021   10:35 AM  6CIT Screen  What Year? 0 points 0 points 0 points  What month? 0 points 0 points 0 points  What time? 0 points 0 points 0 points  Count back from 20 0 points 0 points 0 points  Months in reverse 0 points 0 points 0 points  Repeat phrase 0 points 2 points 0 points  Total Score 0 points 2 points 0 points   (Normal:0-7, Significant for Dysfunction: >8)  Normal Cognitive Function Screening: Yes   Immunization & Health Maintenance Record Immunization History  Administered Date(s) Administered   Fluad Quad(high Dose 65+) 04/13/2020, 05/31/2021   Influenza Split 07/16/2012   Influenza Whole 04/25/2011   Influenza, High Dose Seasonal PF 05/07/2022   Influenza,inj,Quad PF,6+ Mos 03/23/2014, 06/10/2014, 09/29/2015, 03/28/2016, 04/16/2017, 04/30/2018, 04/22/2019   Influenza-Unspecified 05/12/2013   PFIZER(Purple Top)SARS-COV-2 Vaccination 10/15/2019, 11/10/2019   Pneumococcal Conjugate-13 06/16/2009   Pneumococcal Polysaccharide-23 08/14/2018   Tdap 04/25/2011, 05/10/2021   Zoster Recombinant(Shingrix) 05/10/2021, 03/11/2022   Zoster, Live 10/11/2014    Health Maintenance  Topic Date Due   OPHTHALMOLOGY EXAM  05/06/2023 (Originally 01/11/2023)   INFLUENZA VACCINE  05/07/2023 (Originally 03/13/2023)   COVID-19 Vaccine (3 - Pfizer risk series) 05/22/2023 (Originally 12/08/2019)    HEMOGLOBIN A1C  09/13/2023   Diabetic kidney evaluation - Urine ACR  12/26/2023   FOOT EXAM  12/26/2023   Diabetic kidney evaluation - eGFR measurement  04/09/2024   Medicare Annual Wellness (AWV)  05/05/2024   Colonoscopy  06/03/2029   DTaP/Tdap/Td (3 - Td or Tdap) 05/11/2031   Pneumonia Vaccine 49+ Years old  Completed   Hepatitis C Screening  Completed   Zoster Vaccines- Shingrix  Completed   HPV VACCINES  Aged Out       Assessment  This is a routine wellness examination for Jonathan Yu.  Health Maintenance: Due or Overdue There are no preventive care reminders to display for this patient.   Jonathan Yu does not need a referral for Community Assistance: Care Management:   no Social Work:  no Prescription Assistance:  no Nutrition/Diabetes Education:  no   Plan:  Personalized Goals  Goals Addressed               This Visit's Progress     Patient Stated (pt-stated)        Patient stated that he would like to get back to 197 lbs.       Personalized Health Maintenance & Screening Recommendations  Influenza vaccine  Lung Cancer Screening Recommended: no (Low Dose CT Chest recommended if Age 50-80 years, 20 pack-year currently smoking OR have quit w/in past 15 years) Hepatitis C Screening recommended: no HIV Screening recommended: no  Advanced Directives: Written information was not prepared per patient's request.  Referrals & Orders No orders of the defined types were placed in this encounter.   Follow-up Plan Follow-up with Everrett Coombe, DO as planned Patient will have diabetic eye exam records faxed over. Medicare wellness visit in one year.  Patient will access AVS on my chart.   I have personally reviewed and noted the following in the patient's chart:   Medical and social history Use of alcohol, tobacco or illicit drugs  Current medications and supplements Functional ability and status Nutritional status Physical activity Advanced  directives List of other physicians Hospitalizations, surgeries, and ER visits in previous 12 months Vitals Screenings to include cognitive, depression, and falls Referrals and appointments  In addition, I have reviewed and discussed with Jonathan Yu certain preventive protocols, quality metrics, and best practice recommendations. A written personalized care plan for preventive services as well as general preventive health recommendations is available and can be mailed to the patient at his request.      Modesto Charon, RN BSN  05/06/2023

## 2023-05-07 ENCOUNTER — Ambulatory Visit: Payer: Medicare HMO | Admitting: Adult Health

## 2023-05-07 DIAGNOSIS — G4733 Obstructive sleep apnea (adult) (pediatric): Secondary | ICD-10-CM

## 2023-05-07 DIAGNOSIS — G20A2 Parkinson's disease without dyskinesia, with fluctuations: Secondary | ICD-10-CM

## 2023-05-08 ENCOUNTER — Ambulatory Visit (INDEPENDENT_AMBULATORY_CARE_PROVIDER_SITE_OTHER): Payer: Medicare HMO

## 2023-05-08 VITALS — BP 123/67 | HR 95 | Ht 65.0 in | Wt 249.8 lb

## 2023-05-08 DIAGNOSIS — E291 Testicular hypofunction: Secondary | ICD-10-CM

## 2023-05-08 NOTE — Progress Notes (Signed)
Established Patient Office Visit  Subjective   Patient ID: Jonathan Yu, male    DOB: 09-Jan-1951  Age: 72 y.o. MRN: [id number]    HPI Hypogandism Testosterone injection 200 mg   Patient is here for testosterone injection.Denies chest pain, shortness of breath,headaches and problems with medication or mood changes   ROS    Objective:     BP 123/67   Pulse 95   Ht 5\' 5"  (1.651 m)   Wt 249 lb 12.8 oz (113.3 kg)   SpO2 95%   BMI 41.57 kg/m    Physical Exam   No results found for any visits on 05/08/23.    The 10-year ASCVD risk score (Arnett DK, et al., 2019) is: 35%    Assessment & Plan:  Patient tolerated injection well without complications.Patient advised to schedule next injection in 14 days. Problem List Items Addressed This Visit       Endocrine   Hypogonadism in male - Primary    No follow-ups on file.    Nafisah Runions  Lindsay-Shavers, CMA

## 2023-05-08 NOTE — Patient Instructions (Signed)
Patient advised to schedule next injection in 14 days.

## 2023-05-09 ENCOUNTER — Telehealth: Payer: Self-pay

## 2023-05-09 NOTE — Telephone Encounter (Signed)
Pt was in office on 05/08/23 inquiring about DME order for grab bar.   Order was faxed to [facility] supply on 04/27/23.  Advised patient on 05/10/23 via phone call.   Pt would contact [facility] for follow-up. Offered contact information. Pt declined.

## 2023-05-21 ENCOUNTER — Encounter (HOSPITAL_COMMUNITY): Payer: Self-pay

## 2023-05-21 ENCOUNTER — Other Ambulatory Visit: Payer: Self-pay

## 2023-05-21 ENCOUNTER — Emergency Department (HOSPITAL_COMMUNITY)
Admission: EM | Admit: 2023-05-21 | Discharge: 2023-05-21 | Disposition: A | Discharge status: Home / Self Care | Payer: Medicare HMO | Attending: Emergency Medicine | Admitting: Emergency Medicine

## 2023-05-21 ENCOUNTER — Emergency Department (HOSPITAL_COMMUNITY): Payer: Medicare HMO

## 2023-05-21 DIAGNOSIS — G8918 Other acute postprocedural pain: Secondary | ICD-10-CM | POA: Diagnosis not present

## 2023-05-21 DIAGNOSIS — N200 Calculus of kidney: Secondary | ICD-10-CM | POA: Diagnosis not present

## 2023-05-21 DIAGNOSIS — N400 Enlarged prostate without lower urinary tract symptoms: Secondary | ICD-10-CM | POA: Diagnosis not present

## 2023-05-21 DIAGNOSIS — R1010 Upper abdominal pain, unspecified: Secondary | ICD-10-CM | POA: Diagnosis not present

## 2023-05-21 DIAGNOSIS — R935 Abnormal findings on diagnostic imaging of other abdominal regions, including retroperitoneum: Secondary | ICD-10-CM | POA: Diagnosis not present

## 2023-05-21 DIAGNOSIS — R1084 Generalized abdominal pain: Secondary | ICD-10-CM | POA: Diagnosis present

## 2023-05-21 DIAGNOSIS — K9186 Retained cholelithiasis following cholecystectomy: Secondary | ICD-10-CM | POA: Insufficient documentation

## 2023-05-21 LAB — URINALYSIS, ROUTINE W REFLEX MICROSCOPIC
Bilirubin Urine: NEGATIVE
Glucose, UA: NEGATIVE mg/dL
Hgb urine dipstick: NEGATIVE
Ketones, ur: 20 mg/dL — AB
Nitrite: NEGATIVE
Protein, ur: 100 mg/dL — AB
Specific Gravity, Urine: 1.025 (ref 1.005–1.030)
pH: 5 (ref 5.0–8.0)

## 2023-05-21 LAB — I-STAT CG4 LACTIC ACID, ED
Lactic Acid, Venous: 1.1 mmol/L (ref 0.5–1.9)
Lactic Acid, Venous: 1.3 mmol/L (ref 0.5–1.9)

## 2023-05-21 LAB — CBC
HCT: 46.9 % (ref 39.0–52.0)
Hemoglobin: 15 g/dL (ref 13.0–17.0)
MCH: 27.8 pg (ref 26.0–34.0)
MCHC: 32 g/dL (ref 30.0–36.0)
MCV: 86.9 fL (ref 80.0–100.0)
Platelets: 385 10*3/uL (ref 150–400)
RBC: 5.4 MIL/uL (ref 4.22–5.81)
RDW: 12.7 % (ref 11.5–15.5)
WBC: 10.5 10*3/uL (ref 4.0–10.5)
nRBC: 0 % (ref 0.0–0.2)

## 2023-05-21 LAB — COMPREHENSIVE METABOLIC PANEL
ALT: 15 U/L (ref 0–44)
AST: 20 U/L (ref 15–41)
Albumin: 3.6 g/dL (ref 3.5–5.0)
Alkaline Phosphatase: 55 U/L (ref 38–126)
Anion gap: 11 (ref 5–15)
BUN: 17 mg/dL (ref 8–23)
CO2: 22 mmol/L (ref 22–32)
Calcium: 9.7 mg/dL (ref 8.9–10.3)
Chloride: 102 mmol/L (ref 98–111)
Creatinine, Ser: 1 mg/dL (ref 0.61–1.24)
GFR, Estimated: >60 mL/min (ref >60)
Glucose, Bld: 98 mg/dL (ref 70–99)
Potassium: 4.3 mmol/L (ref 3.5–5.1)
Sodium: 135 mmol/L (ref 135–145)
Total Bilirubin: 2.1 mg/dL — ABNORMAL HIGH (ref 0.3–1.2)
Total Protein: 8.3 g/dL — ABNORMAL HIGH (ref 6.5–8.1)

## 2023-05-21 LAB — LIPASE, BLOOD: Lipase: 23 U/L (ref 11–51)

## 2023-05-21 MED ORDER — IOHEXOL 300 MG/ML  SOLN
100.0000 mL | Freq: Once | INTRAMUSCULAR | Status: AC | PRN
  Administered 2023-05-21: 100 mL via INTRAVENOUS

## 2023-05-21 MED ORDER — FENTANYL CITRATE PF 50 MCG/ML IJ SOSY
50.0000 ug | PREFILLED_SYRINGE | Freq: Once | INTRAMUSCULAR | Status: AC
  Administered 2023-05-21: 50 ug via INTRAVENOUS
  Filled 2023-05-21: qty 1

## 2023-05-21 MED ORDER — ONDANSETRON HCL 4 MG/2ML IJ SOLN
4.0000 mg | Freq: Once | INTRAMUSCULAR | Status: AC
  Administered 2023-05-21: 4 mg via INTRAVENOUS
  Filled 2023-05-21: qty 2

## 2023-05-21 MED ORDER — HYDROMORPHONE HCL 2 MG PO TABS
2.0000 mg | ORAL_TABLET | Freq: Four times a day (QID) | ORAL | 0 refills | Status: DC | PRN
Start: 2023-05-21 — End: 2023-10-06

## 2023-05-21 MED ORDER — FENTANYL CITRATE PF 50 MCG/ML IJ SOSY
50.0000 ug | PREFILLED_SYRINGE | INTRAMUSCULAR | Status: DC | PRN
  Administered 2023-05-21: 50 ug via INTRAVENOUS
  Filled 2023-05-21: qty 1

## 2023-05-21 NOTE — Discharge Instructions (Addendum)
Your laboratory evaluation and CT imaging was overall reassuring today.  Your CT imaging showed expected postoperative changes.  You may ultimately require a Pete surgery, general surgery did evaluate your CT today and are comfortable with the plan for follow-up in clinic on Friday.  Dilaudid has been prescribed for pain control.

## 2023-05-21 NOTE — ED Triage Notes (Signed)
Pt had gallbladder removed 8/1, pt states he had gangrene in gallbladder. Pt started having abdominal pain again 2 weeks ago with increased fatigue. Called surgeon and they told pt to come to ER.

## 2023-05-21 NOTE — ED Provider Notes (Signed)
[place] EMERGENCY DEPARTMENT AT [facility] Provider Note   CSN: [id number] Arrival date & time: 05/21/23  1402     History  Chief Complaint  Patient presents with   Abdominal Pain    Jonathan Yu is a 72 y.o. male.   Abdominal Pain    72 year old male with recent medical history significant for cholecystectomy with gangrenous cholecystitis with a subtotal cholecystectomy complicated by biliary leak, common bile duct stent placement and a percutaneous drain which have been removed presenting with roughly 2 weeks of worsening abdominal distention, firmness and generalized abdominal pain with some focality to the right upper quadrant.  The patient additionally states that he is have some decreased stool output.  He has been taking MiraLAX 3 capfuls a day and trying to increase his oral hydration.  He is passing stools and feels that his stools are not particularly hard and he does not feel constipated he is passing gas.  He endorses persistent and worsening postoperative pain, however he is tolerating oral intake and maintaining his hydration.  Has any fevers or chills.  Home Medications Prior to Admission medications   Medication Sig Start Date End Date Taking? Authorizing Provider  HYDROmorphone (DILAUDID) 2 MG tablet Take 1 tablet (2 mg total) by mouth every 6 (six) hours as needed for severe pain. 05/21/23  Yes Ernie Avena, MD  albuterol (PROVENTIL HFA;VENTOLIN HFA) 108 (90 Base) MCG/ACT inhaler Inhale 2 puffs into the lungs every 6 (six) hours as needed for wheezing or shortness of breath. 07/31/17   Rodolph Bong, MD  AMBULATORY NON FORMULARY MEDICATION Continue CPAP at current settings.  Please provide new supplies. [name] phone number [phone] 06/03/19   Rodolph Bong, MD  AMBULATORY NON FORMULARY MEDICATION Compression stockings.  Vive size LGM Leg swelling. Disp 1 pair 99 refil. 06/09/19   Rodolph Bong, MD  AMBULATORY NON FORMULARY MEDICATION  Please provide:  12 inch grab bar x2 Trapeze bar to assist with getting out of bed.  Dx: R53.81, Z90.49, K83.9 03/27/23   Everrett Coombe, DO  amLODipine (NORVASC) 5 MG tablet Take 1 tablet (5 mg total) by mouth daily. 04/29/23   Everrett Coombe, DO  anastrozole (ARIMIDEX) 1 MG tablet Take 1 tablet (1 mg total) by mouth every Monday, Wednesday, and Friday. 01/03/23   Everrett Coombe, DO  atorvastatin (LIPITOR) 40 MG tablet TAKE 1 TABLET EVERY DAY Patient taking differently: Take 40 mg by mouth daily. 09/27/22   Everrett Coombe, DO  Azelaic Acid 15 % cream Apply 1 application topically 2 (two) times daily. Patient taking differently: Apply 1 application  topically 2 (two) times daily as needed (flares). 06/03/19   Rodolph Bong, MD  busPIRone (BUSPAR) 5 MG tablet Take 1 tablet (5 mg total) by mouth 3 (three) times daily as needed. May take an optional 4th dose in a day if needed. 04/10/23   Huston Foley, MD  Carbidopa-Levodopa ER (SINEMET CR) 25-100 MG tablet controlled release Take 1 tablet by mouth daily. Dose will increase by 1 tablet each week until taking 3 times daily 02/26/23 03/11/24  [provider]  fluticasone (FLONASE) 50 MCG/ACT nasal spray Place 1 spray into both nostrils as needed for allergies.    [provider]  furosemide (LASIX) 20 MG tablet TAKE 1 TABLET BY MOUTH ONCE DAILY AS NEEDED FOR EDEMA 03/12/22   Everrett Coombe, DO  glipiZIDE (GLUCOTROL) 5 MG tablet TAKE 1 TABLET TWICE DAILY BEFORE MEALS 04/24/23   Everrett Coombe, DO  LINZESS 145 MCG CAPS capsule Take 145 mcg by mouth daily. 02/05/23   [provider]  loratadine (CLARITIN) 10 MG tablet Take 10 mg by mouth daily as needed for allergies.    [provider]  losartan (COZAAR) 50 MG tablet TAKE 1 TABLET EVERY DAY 09/27/22   Everrett Coombe, DO  metFORMIN (GLUCOPHAGE-XR) 500 MG 24 hr tablet Take 1 tablet (500 mg total) by mouth 2 (two) times daily with a meal. 01/02/23   Everrett Coombe, DO  Misc. Devices  (FLEX THERAPY) MISC by Does not apply route.    [provider]  mometasone (ELOCON) 0.1 % cream Apply 1 Application topically daily. 07/15/22   Everrett Coombe, DO  Multiple Vitamin (MULTIVITAMIN WITH MINERALS) TABS tablet Take 1 tablet by mouth daily.    [provider]  ondansetron (ZOFRAN) 4 MG tablet Take 1 tablet (4 mg total) by mouth every 6 (six) hours as needed for nausea. 03/24/23   Regalado, Jon Billings A, MD  Probiotic Product (PROBIOTIC PO) Take 2 capsules by mouth daily.    [provider]  rOPINIRole (REQUIP) 2 MG tablet TAKE 1 TABLET THREE TIMES DAILY 10/14/22   Huston Foley, MD  tamsulosin (FLOMAX) 0.4 MG CAPS capsule Take 2 capsules (0.8 mg total) by mouth daily. 01/02/23   Everrett Coombe, DO  triamcinolone cream (KENALOG) 0.1 % Apply 1 Application topically as needed (flares). 02/03/23   [provider]  VITAMIN D PO Take 2,000 Units by mouth daily.    [provider]      Allergies    Sesame oil, Clonazepam, Soy allergy, Tape, Percocet [oxycodone-acetaminophen], Topamax [topiramate], and Vicodin [hydrocodone-acetaminophen]    Review of Systems   Review of Systems  Gastrointestinal:  Positive for abdominal pain.  All other systems reviewed and are negative.   Physical Exam Updated Vital Signs BP 111/81 (BP Location: Left Arm)   Pulse 81   Temp 97.9 F (36.6 C) (Oral)   Resp 17   Ht 5\' 5"  (1.651 m)   Wt 109.8 kg   SpO2 94%   BMI 40.27 kg/m  Physical Exam Vitals and nursing note reviewed.  Constitutional:      General: He is not in acute distress.    Appearance: He is well-developed.  HENT:     Head: Normocephalic and atraumatic.  Eyes:     Conjunctiva/sclera: Conjunctivae normal.  Cardiovascular:     Rate and Rhythm: Normal rate and regular rhythm.     Heart sounds: No murmur heard. Pulmonary:     Effort: Pulmonary effort is normal. No respiratory distress.     Breath sounds: Normal breath sounds.  Abdominal:      Palpations: Abdomen is soft.     Tenderness: There is generalized abdominal tenderness. There is guarding.  Musculoskeletal:        General: No swelling.     Cervical back: Neck supple.  Skin:    General: Skin is warm and dry.     Capillary Refill: Capillary refill takes less than 2 seconds.  Neurological:     Mental Status: He is alert.  Psychiatric:        Mood and Affect: Mood normal.     ED Results / Procedures / Treatments   Labs (all labs ordered are listed, but only abnormal results are displayed) Labs Reviewed  COMPREHENSIVE METABOLIC PANEL - Abnormal; Notable for the following components:      Result Value   Total Protein 8.3 (*)    Total Bilirubin  2.1 (*)    All other components within normal limits  URINALYSIS, ROUTINE W REFLEX MICROSCOPIC - Abnormal; Notable for the following components:   Color, Urine AMBER (*)    APPearance HAZY (*)    Ketones, ur 20 (*)    Protein, ur 100 (*)    Leukocytes,Ua TRACE (*)    Bacteria, UA RARE (*)    All other components within normal limits  LIPASE, BLOOD  CBC  I-STAT CG4 LACTIC ACID, ED  I-STAT CG4 LACTIC ACID, ED    EKG None  Radiology CT ABDOMEN PELVIS W CONTRAST  Result Date: 05/21/2023 CLINICAL DATA:  Acute abdominal pain. Subtotal cholecystectomy in August with common bile duct stent in percutaneous drain, both of which removed over the last month. Increasing abdominal distension and pain. EXAM: CT ABDOMEN AND PELVIS WITH CONTRAST TECHNIQUE: Multidetector CT imaging of the abdomen and pelvis was performed using the standard protocol following bolus administration of intravenous contrast. RADIATION DOSE REDUCTION: This exam was performed according to the departmental dose-optimization program which includes automated exposure control, adjustment of the mA and/or kV according to patient size and/or use of iterative reconstruction technique. CONTRAST:  OMNIPAQUE IOHEXOL 300 MG/ML  SOLN COMPARISON:  CT 03/17/2023  FINDINGS: Lower chest: Subsegmental atelectasis in the left lung base. No pleural effusion. Hepatobiliary: Patient is post subtotal cholecystectomy. Multiple stones in the gallbladder fossa are again seen. Increase in adjacent fat stranding and ill-defined hypodensity in the liver adjacent to the gallbladder fossa. There is a soft tissue tract at site of prior drain extending anteriorly in the right abdomen, series 2, image 30, but no discrete fluid collection. Dropped stones or clips adjacent to the inferior right lobe of the liver. Diffuse hepatic steatosis. Normal caliber common bile duct. There is no intrahepatic biliary ductal dilatation. Pancreas: No ductal dilatation or inflammation. Spleen: Normal in size without focal abnormality. Adrenals/Urinary Tract: Normal adrenal glands. No hydronephrosis or renal inflammation. Previous renal calculi are not confidently seen on the current exam. Possible punctate stone in the mid posterior left kidney. Decompressed ureters. Urinary bladder is near completely empty. Stomach/Bowel: The stomach is decompressed. Minimal wall thickening about the distal stomach and proximal duodenum adjacent to gallbladder fossa/right upper quadrant fat stranding, presumably reactive small bowel is otherwise unremarkable without obstruction or inflammation. Fluid/liquid stool throughout the colon. Occasional colonic diverticula. No diverticulitis or colonic inflammation. Vascular/Lymphatic: Aortic atherosclerosis. No aneurysm. The portal vein is patent. Few prominent portal caval nodes Reproductive: Enlarged prostate gland causing mass effect on the bladder base. Other: No ascites or free fluid. Right upper quadrant fat stranding adjacent to the gallbladder fossa as described. Tract at site of prior drain in the right anterior abdomen. There is stranding adjacent to the structure without discrete fluid collection. No free intra-abdominal air. Musculoskeletal: Right L5 pars defect.  Degenerative change throughout the spine. No acute osseous finding. No intramuscular collection. IMPRESSION: 1. Patient is post subtotal cholecystectomy. Multiple stones in the gallbladder fossa are again seen. Increase in adjacent fat stranding and ill-defined hypodensity in the liver adjacent to the gallbladder fossa suggestive of inflammation. 2. Soft tissue tract at site of prior drain extending anteriorly in the right abdomen, but no discrete fluid collection. Mild stranding adjacent to the tract site suggestive of inflammation. 3. No biliary dilatation. 4. Fluid/liquid stool throughout the colon, can be seen with diarrheal illness. 5. Hepatic steatosis. 6. Enlarged prostate gland causing mass effect on the bladder base. Aortic Atherosclerosis (ICD10-I70.0). Electronically Signed   By: Shawna Orleans  Sanford M.D.   On: 05/21/2023 18:03    Procedures Procedures    Medications Ordered in ED Medications  fentaNYL (SUBLIMAZE) injection 50 mcg (50 mcg Intravenous Given 05/21/23 1949)  fentaNYL (SUBLIMAZE) injection 50 mcg (50 mcg Intravenous Given 05/21/23 1509)  ondansetron (ZOFRAN) injection 4 mg (4 mg Intravenous Given 05/21/23 1509)  iohexol (OMNIPAQUE) 300 MG/ML solution 100 mL (100 mLs Intravenous Contrast Given 05/21/23 1558)    ED Course/ Medical Decision Making/ A&P                                 Medical Decision Making Amount and/or Complexity of Data Reviewed Labs: ordered.  Risk Prescription drug management.    72 year old male with recent medical history significant for cholecystectomy with gangrenous cholecystitis with a subtotal cholecystectomy complicated by biliary leak, common bile duct stent placement and a percutaneous drain which have been removed presenting with roughly 2 weeks of worsening abdominal distention, firmness and generalized abdominal pain with some focality to the right upper quadrant.  The patient additionally states that he is have some decreased stool output.   He has been taking MiraLAX 3 capfuls a day and trying to increase his oral hydration.  He is passing stools and feels that his stools are not particularly hard and he does not feel constipated he is passing gas.  He endorses persistent and worsening postoperative pain, however he is tolerating oral intake and maintaining his hydration.  Has any fevers or chills.  On arrival, the patient was vitally stable, afebrile, not tachycardic or tachypneic, BP 131/78, saturating 95% on room air.  Physical exam significant for generalized abdominal tenderness to palpation, some focality to the right upper quadrant with guarding present.  Differential diagnose include small bowel obstruction, postoperative pain, operative infection.  Considered constipation versus colitis or other acute intra-abdominal abnormality.  The patient is afebrile, well-appearing and tolerating oral intake.  The access was obtained and the patient was administered IV fentanyl in addition to IV Zofran.  Screening labs were obtained to include a lactic acid which was normal, lipase, CBC, CMP were all unremarkable.  Urinalysis revealed only trace leukocytes and rare bacteria, unconvincing for urinary tract infection.  CT Abd Pel: IMPRESSION:  1. Patient is post subtotal cholecystectomy. Multiple stones in the  gallbladder fossa are again seen. Increase in adjacent fat stranding  and ill-defined hypodensity in the liver adjacent to the gallbladder  fossa suggestive of inflammation.  2. Soft tissue tract at site of prior drain extending anteriorly in  the right abdomen, but no discrete fluid collection. Mild stranding  adjacent to the tract site suggestive of inflammation.  3. No biliary dilatation.  4. Fluid/liquid stool throughout the colon, can be seen with  diarrheal illness.  5. Hepatic steatosis.  6. Enlarged prostate gland causing mass effect on the bladder base.    Aortic Atherosclerosis (ICD10-I70.0).    Consulted general  surgery regarding the patient's CT imaging who reviewed the imaging.  I spoke with Dr. Michaell Cowing regarding the patient's imaging.  He felt patient would be stable for follow-up in clinic which is scheduled for Friday.  The patient has no opiates for pain control, advised continue bowel regimen with MiraLAX, continued oral rehydration, Dilaudid was prescribed orally for pain control with the plan for close outpatient follow-up with his surgeon.  Turn precautions provided, overall stable for discharge.  Final Clinical Impression(s) / ED Diagnoses Final diagnoses:  Post-operative pain  Rx / DC Orders ED Discharge Orders          Ordered    HYDROmorphone (DILAUDID) 2 MG tablet  Every 6 hours PRN        05/21/23 1858              Ernie Avena, MD 05/21/23 [phone]

## 2023-05-21 NOTE — ED Provider Triage Note (Signed)
Emergency Medicine Provider Triage Evaluation Note  Jonathan Yu , a 72 y.o. male  was evaluated in triage.  Pt complains of abdominal pain..  Patient has a past medical history of gangrenous cholecystitis patient underwent a subtotal cholecystectomy in August and had a common bile duct stent and a percutaneous drain both of which have been removed over the past month.  Patient reports over the past 2 weeks he has had progressively worsening distention, firmness, pain in his abdomen with associated decreased stool output.  He has been taking MiraLAX and passing small amounts of stool and mucoid discharge.  He denies nausea vomiting or fever.  Review of Systems  Positive: Abdominal distention and pain Negative: Fever  Physical Exam  BP 131/78 (BP Location: Left Arm)   Pulse 89   Temp 97.9 F (36.6 C) (Oral)   Resp 18   Ht 5\' 5"  (1.651 m)   Wt 109.8 kg   SpO2 95%   BMI 40.27 kg/m  Gen:   Awake, no distress   Resp:  Normal effort  MSK:   Moves extremities without difficulty  Other:  Abd tenderness and distension   Medical Decision Making  Medically screening exam initiated at 2:50 PM.  Appropriate orders placed.  Monta Police was informed that the remainder of the evaluation will be completed by another provider, this initial triage assessment does not replace that evaluation, and the importance of remaining in the ED until their evaluation is complete.    Arthor Captain, PA-C 05/21/23 1454

## 2023-05-22 ENCOUNTER — Ambulatory Visit: Payer: Medicare HMO

## 2023-05-22 VITALS — BP 114/62 | HR 85 | Ht 65.0 in | Wt 242.0 lb

## 2023-05-22 DIAGNOSIS — E291 Testicular hypofunction: Secondary | ICD-10-CM | POA: Diagnosis not present

## 2023-05-22 NOTE — Patient Instructions (Signed)
Return in 14 days for testosterone injection - nurse visit. 

## 2023-05-22 NOTE — Progress Notes (Signed)
   Established Patient Office Visit  Subjective   Patient ID: Jonathan Yu, male    DOB: 1951/04/20  Age: 72 y.o. MRN: [id number]  Chief Complaint  Patient presents with   hypogonadism in male     Testosterone injection nurse visit.     HPI  Hypogonadism in male. Testosterone injection nurse visit. Patient denies chest pain, shortness of breath, dizziness, palpitations, mood changes or medication problems. Patient does state was seen in ER yesterday for abdominal pain - states all scans and lab work were good. Patient approved for testosterone injection and flu shot today by Dr. Ashley Royalty.   ROS    Objective:     BP 114/62   Pulse 85   Ht 5\' 5"  (1.651 m)   Wt 242 lb (109.8 kg)   SpO2 96%   BMI 40.27 kg/m    Physical Exam   No results found for any visits on 05/22/23.    The 10-year ASCVD risk score (Arnett DK, et al., 2019) is: 31.3%    Assessment & Plan:  Testosterone injection nurse visit. Administered 200mg  IM RUOQ. Patient tolerated injection well without complication. Return in 14 days for  nurse visit for testosterone injection.  Problem List Items Addressed This Visit       Endocrine   Hypogonadism in male - Primary    Return in about 2 weeks (around 06/05/2023) for testosterone injection nurse visit. Elizabeth Palau, LPN

## 2023-05-30 DIAGNOSIS — R1011 Right upper quadrant pain: Secondary | ICD-10-CM | POA: Diagnosis not present

## 2023-06-03 DIAGNOSIS — G20A1 Parkinson's disease without dyskinesia, without mention of fluctuations: Secondary | ICD-10-CM | POA: Diagnosis not present

## 2023-06-03 DIAGNOSIS — H524 Presbyopia: Secondary | ICD-10-CM | POA: Diagnosis not present

## 2023-06-05 ENCOUNTER — Ambulatory Visit (INDEPENDENT_AMBULATORY_CARE_PROVIDER_SITE_OTHER): Payer: Medicare HMO

## 2023-06-05 VITALS — BP 115/62 | HR 84 | Ht 65.0 in | Wt 237.0 lb

## 2023-06-05 DIAGNOSIS — E291 Testicular hypofunction: Secondary | ICD-10-CM

## 2023-06-05 NOTE — Patient Instructions (Signed)
Return in 14 days for nurse visit testosterone injection.

## 2023-06-05 NOTE — Progress Notes (Signed)
   Established Patient Office Visit  Subjective   Patient ID: Jonathan Yu, male    DOB: [birth year]  Age: 72 y.o. MRN: [id number]  Chief Complaint  Patient presents with   hypogonasism in male    Testosterone injection nurse visit.     HPI  Testosterone injection nurse visit.  Patient denies chest pain, shortness of breath, dizziness, palpitations, mood or med changes.   ROS    Objective:     BP 115/62   Pulse 84   Ht 5\' 5"  (1.651 m)   Wt 237 lb (107.5 kg)   SpO2 95%   BMI 39.44 kg/m    Physical Exam   No results found for any visits on 06/05/23.    The 10-year ASCVD risk score (Arnett DK, et al., 2019) is: 31.7%    Assessment & Plan:  Testosterone injection - admin 200mg  IM LUOQ patient tolerated injection well without complications. Return in 14 days for next testosterone injection.  Problem List Items Addressed This Visit   None   No follow-ups on file.    Elizabeth Palau, LPN

## 2023-06-11 DIAGNOSIS — R5381 Other malaise: Secondary | ICD-10-CM | POA: Diagnosis not present

## 2023-06-11 DIAGNOSIS — G20C Parkinsonism, unspecified: Secondary | ICD-10-CM | POA: Diagnosis not present

## 2023-06-11 DIAGNOSIS — M6281 Muscle weakness (generalized): Secondary | ICD-10-CM | POA: Diagnosis not present

## 2023-06-18 DIAGNOSIS — G20C Parkinsonism, unspecified: Secondary | ICD-10-CM | POA: Diagnosis not present

## 2023-06-18 DIAGNOSIS — R5381 Other malaise: Secondary | ICD-10-CM | POA: Diagnosis not present

## 2023-06-18 DIAGNOSIS — M6281 Muscle weakness (generalized): Secondary | ICD-10-CM | POA: Diagnosis not present

## 2023-06-19 ENCOUNTER — Ambulatory Visit: Payer: Medicare HMO

## 2023-06-20 ENCOUNTER — Ambulatory Visit (INDEPENDENT_AMBULATORY_CARE_PROVIDER_SITE_OTHER): Payer: Medicare HMO

## 2023-06-20 ENCOUNTER — Telehealth: Payer: Self-pay

## 2023-06-20 DIAGNOSIS — E291 Testicular hypofunction: Secondary | ICD-10-CM

## 2023-06-20 NOTE — Telephone Encounter (Signed)
Transition Care Management Unsuccessful Follow-up Telephone Call  Date of discharge and from where:  [facility] 10/9  Attempts:  1st Attempt  Reason for unsuccessful TCM follow-up call:  No answer/busy   Jonathan Yu [name]  [name], [name], Phone: [phone] Website: [name]

## 2023-06-20 NOTE — Progress Notes (Signed)
   Established Patient Office Visit  Subjective   Patient ID: Jonathan Yu, male    DOB: 10-16-1950  Age: 71 y.o. MRN: [id number]  Chief Complaint  Patient presents with   Hypogonadism    HPI  Jonathan Yu is here for a testosterone injection. Denies chest pain, shortness of breath, headaches or mood changes.   ROS    Objective:     There were no vitals taken for this visit.   Physical Exam   No results found for any visits on 06/20/23.    The 10-year ASCVD risk score (Arnett DK, et al., 2019) is: 31.7%    Assessment & Plan:  Testosterone injection - Patient tolerated injection well without complications. Patient advised to schedule next injection 14 days from today.     Problem List Items Addressed This Visit       Unprioritized   Hypogonadism in male - Primary    Return in about 2 weeks (around 07/04/2023) for testosterone injection. Earna Coder, Janalyn Harder, CMA

## 2023-06-23 ENCOUNTER — Telehealth: Payer: Self-pay

## 2023-06-23 NOTE — Telephone Encounter (Signed)
Transition Care Management Unsuccessful Follow-up Telephone Call  Date of discharge and from where:  [facility] 10/9  Attempts:  2nd Attempt  Reason for unsuccessful TCM follow-up call:  No answer/busy   Lenard Forth [place]  [facility], [facility] Guide, Phone: [phone] Website: [name]

## 2023-06-27 DIAGNOSIS — M6281 Muscle weakness (generalized): Secondary | ICD-10-CM | POA: Diagnosis not present

## 2023-06-27 DIAGNOSIS — R5381 Other malaise: Secondary | ICD-10-CM | POA: Diagnosis not present

## 2023-06-27 DIAGNOSIS — G20C Parkinsonism, unspecified: Secondary | ICD-10-CM | POA: Diagnosis not present

## 2023-07-02 ENCOUNTER — Encounter (HOSPITAL_COMMUNITY): Payer: Self-pay

## 2023-07-02 ENCOUNTER — Encounter: Payer: Self-pay | Admitting: Family Medicine

## 2023-07-02 ENCOUNTER — Inpatient Hospital Stay (HOSPITAL_COMMUNITY)
Admission: EM | Admit: 2023-07-02 | Discharge: 2023-07-07 | DRG: 857 | Disposition: A | Discharge status: Home Health | Payer: Medicare HMO | Attending: Internal Medicine | Admitting: Internal Medicine

## 2023-07-02 ENCOUNTER — Ambulatory Visit (INDEPENDENT_AMBULATORY_CARE_PROVIDER_SITE_OTHER): Payer: Medicare HMO | Admitting: Family Medicine

## 2023-07-02 ENCOUNTER — Emergency Department (HOSPITAL_COMMUNITY): Payer: Medicare HMO

## 2023-07-02 VITALS — BP 101/66 | HR 93 | Ht 65.0 in | Wt 239.0 lb

## 2023-07-02 DIAGNOSIS — I10 Essential (primary) hypertension: Secondary | ICD-10-CM | POA: Diagnosis not present

## 2023-07-02 DIAGNOSIS — Z6838 Body mass index (BMI) 38.0-38.9, adult: Secondary | ICD-10-CM

## 2023-07-02 DIAGNOSIS — Z96661 Presence of right artificial ankle joint: Secondary | ICD-10-CM | POA: Diagnosis present

## 2023-07-02 DIAGNOSIS — N200 Calculus of kidney: Secondary | ICD-10-CM | POA: Diagnosis not present

## 2023-07-02 DIAGNOSIS — Z79899 Other long term (current) drug therapy: Secondary | ICD-10-CM | POA: Diagnosis not present

## 2023-07-02 DIAGNOSIS — E8809 Other disorders of plasma-protein metabolism, not elsewhere classified: Secondary | ICD-10-CM | POA: Diagnosis present

## 2023-07-02 DIAGNOSIS — K219 Gastro-esophageal reflux disease without esophagitis: Secondary | ICD-10-CM | POA: Diagnosis present

## 2023-07-02 DIAGNOSIS — T8149XA Infection following a procedure, other surgical site, initial encounter: Principal | ICD-10-CM

## 2023-07-02 DIAGNOSIS — Z885 Allergy status to narcotic agent status: Secondary | ICD-10-CM

## 2023-07-02 DIAGNOSIS — F17290 Nicotine dependence, other tobacco product, uncomplicated: Secondary | ICD-10-CM | POA: Diagnosis present

## 2023-07-02 DIAGNOSIS — K8020 Calculus of gallbladder without cholecystitis without obstruction: Secondary | ICD-10-CM | POA: Diagnosis not present

## 2023-07-02 DIAGNOSIS — E119 Type 2 diabetes mellitus without complications: Secondary | ICD-10-CM

## 2023-07-02 DIAGNOSIS — E669 Obesity, unspecified: Secondary | ICD-10-CM | POA: Diagnosis not present

## 2023-07-02 DIAGNOSIS — G2581 Restless legs syndrome: Secondary | ICD-10-CM | POA: Diagnosis present

## 2023-07-02 DIAGNOSIS — L02211 Cutaneous abscess of abdominal wall: Secondary | ICD-10-CM | POA: Diagnosis not present

## 2023-07-02 DIAGNOSIS — Y838 Other surgical procedures as the cause of abnormal reaction of the patient, or of later complication, without mention of misadventure at the time of the procedure: Secondary | ICD-10-CM | POA: Diagnosis present

## 2023-07-02 DIAGNOSIS — Z9049 Acquired absence of other specified parts of digestive tract: Secondary | ICD-10-CM

## 2023-07-02 DIAGNOSIS — G20A1 Parkinson's disease without dyskinesia, without mention of fluctuations: Secondary | ICD-10-CM | POA: Diagnosis present

## 2023-07-02 DIAGNOSIS — K632 Fistula of intestine: Secondary | ICD-10-CM | POA: Insufficient documentation

## 2023-07-02 DIAGNOSIS — B961 Klebsiella pneumoniae [K. pneumoniae] as the cause of diseases classified elsewhere: Secondary | ICD-10-CM | POA: Diagnosis present

## 2023-07-02 DIAGNOSIS — G20C Parkinsonism, unspecified: Secondary | ICD-10-CM | POA: Diagnosis present

## 2023-07-02 DIAGNOSIS — Z7984 Long term (current) use of oral hypoglycemic drugs: Secondary | ICD-10-CM | POA: Diagnosis not present

## 2023-07-02 DIAGNOSIS — R1011 Right upper quadrant pain: Secondary | ICD-10-CM

## 2023-07-02 DIAGNOSIS — Z79811 Long term (current) use of aromatase inhibitors: Secondary | ICD-10-CM | POA: Diagnosis not present

## 2023-07-02 DIAGNOSIS — R140 Abdominal distension (gaseous): Secondary | ICD-10-CM | POA: Diagnosis not present

## 2023-07-02 DIAGNOSIS — N400 Enlarged prostate without lower urinary tract symptoms: Secondary | ICD-10-CM | POA: Diagnosis present

## 2023-07-02 DIAGNOSIS — T8141XA Infection following a procedure, superficial incisional surgical site, initial encounter: Principal | ICD-10-CM | POA: Diagnosis present

## 2023-07-02 DIAGNOSIS — J45909 Unspecified asthma, uncomplicated: Secondary | ICD-10-CM | POA: Diagnosis present

## 2023-07-02 DIAGNOSIS — F419 Anxiety disorder, unspecified: Secondary | ICD-10-CM | POA: Diagnosis present

## 2023-07-02 DIAGNOSIS — K5900 Constipation, unspecified: Secondary | ICD-10-CM | POA: Diagnosis present

## 2023-07-02 DIAGNOSIS — R109 Unspecified abdominal pain: Secondary | ICD-10-CM | POA: Diagnosis not present

## 2023-07-02 DIAGNOSIS — E875 Hyperkalemia: Secondary | ICD-10-CM | POA: Diagnosis not present

## 2023-07-02 DIAGNOSIS — Z888 Allergy status to other drugs, medicaments and biological substances status: Secondary | ICD-10-CM

## 2023-07-02 DIAGNOSIS — E785 Hyperlipidemia, unspecified: Secondary | ICD-10-CM | POA: Diagnosis not present

## 2023-07-02 DIAGNOSIS — E291 Testicular hypofunction: Secondary | ICD-10-CM

## 2023-07-02 DIAGNOSIS — K9186 Retained cholelithiasis following cholecystectomy: Secondary | ICD-10-CM | POA: Diagnosis present

## 2023-07-02 DIAGNOSIS — L0291 Cutaneous abscess, unspecified: Secondary | ICD-10-CM | POA: Diagnosis not present

## 2023-07-02 DIAGNOSIS — G4733 Obstructive sleep apnea (adult) (pediatric): Secondary | ICD-10-CM | POA: Diagnosis present

## 2023-07-02 DIAGNOSIS — Z820 Family history of epilepsy and other diseases of the nervous system: Secondary | ICD-10-CM

## 2023-07-02 DIAGNOSIS — K828 Other specified diseases of gallbladder: Secondary | ICD-10-CM | POA: Diagnosis not present

## 2023-07-02 DIAGNOSIS — K810 Acute cholecystitis: Secondary | ICD-10-CM | POA: Diagnosis present

## 2023-07-02 LAB — CBC WITH DIFFERENTIAL/PLATELET
Abs Immature Granulocytes: 0.04 10*3/uL (ref 0.00–0.07)
Basophils Absolute: 0 10*3/uL (ref 0.0–0.1)
Basophils Relative: 0 %
Eosinophils Absolute: 0.1 10*3/uL (ref 0.0–0.5)
Eosinophils Relative: 1 %
HCT: 44.3 % (ref 39.0–52.0)
Hemoglobin: 13.6 g/dL (ref 13.0–17.0)
Immature Granulocytes: 0 %
Lymphocytes Relative: 10 %
Lymphs Abs: 1.1 10*3/uL (ref 0.7–4.0)
MCH: 26.1 pg (ref 26.0–34.0)
MCHC: 30.7 g/dL (ref 30.0–36.0)
MCV: 84.9 fL (ref 80.0–100.0)
Monocytes Absolute: 0.7 10*3/uL (ref 0.1–1.0)
Monocytes Relative: 7 %
Neutro Abs: 8.8 10*3/uL — ABNORMAL HIGH (ref 1.7–7.7)
Neutrophils Relative %: 82 %
Platelets: 381 10*3/uL (ref 150–400)
RBC: 5.22 MIL/uL (ref 4.22–5.81)
RDW: 14 % (ref 11.5–15.5)
WBC: 10.7 10*3/uL — ABNORMAL HIGH (ref 4.0–10.5)
nRBC: 0 % (ref 0.0–0.2)

## 2023-07-02 LAB — COMPREHENSIVE METABOLIC PANEL
ALT: 5 U/L (ref 0–44)
AST: 14 U/L — ABNORMAL LOW (ref 15–41)
Albumin: 3.2 g/dL — ABNORMAL LOW (ref 3.5–5.0)
Alkaline Phosphatase: 54 U/L (ref 38–126)
Anion gap: 9 (ref 5–15)
BUN: 16 mg/dL (ref 8–23)
CO2: 26 mmol/L (ref 22–32)
Calcium: 9.4 mg/dL (ref 8.9–10.3)
Chloride: 102 mmol/L (ref 98–111)
Creatinine, Ser: 0.97 mg/dL (ref 0.61–1.24)
GFR, Estimated: >60 mL/min (ref >60)
Glucose, Bld: 97 mg/dL (ref 70–99)
Potassium: 4.4 mmol/L (ref 3.5–5.1)
Sodium: 137 mmol/L (ref 135–145)
Total Bilirubin: 1.3 mg/dL — ABNORMAL HIGH (ref <1.2)
Total Protein: 7.7 g/dL (ref 6.5–8.1)

## 2023-07-02 LAB — URINALYSIS, W/ REFLEX TO CULTURE (INFECTION SUSPECTED)
Bacteria, UA: NONE SEEN
Bilirubin Urine: NEGATIVE
Glucose, UA: NEGATIVE mg/dL
Hgb urine dipstick: NEGATIVE
Ketones, ur: 5 mg/dL — AB
Leukocytes,Ua: NEGATIVE
Nitrite: NEGATIVE
Protein, ur: NEGATIVE mg/dL
Specific Gravity, Urine: 1.02 (ref 1.005–1.030)
pH: 6 (ref 5.0–8.0)

## 2023-07-02 LAB — GLUCOSE, CAPILLARY: Glucose-Capillary: 76 mg/dL (ref 70–99)

## 2023-07-02 LAB — LIPASE, BLOOD: Lipase: 22 U/L (ref 11–51)

## 2023-07-02 MED ORDER — SODIUM CHLORIDE 0.9 % IV BOLUS
1000.0000 mL | Freq: Once | INTRAVENOUS | Status: AC
  Administered 2023-07-02: 1000 mL via INTRAVENOUS

## 2023-07-02 MED ORDER — ENOXAPARIN SODIUM 40 MG/0.4ML IJ SOSY
40.0000 mg | PREFILLED_SYRINGE | INTRAMUSCULAR | Status: DC
  Administered 2023-07-02 – 2023-07-06 (×5): 40 mg via SUBCUTANEOUS
  Filled 2023-07-02 (×5): qty 0.4

## 2023-07-02 MED ORDER — HYDROMORPHONE HCL 1 MG/ML IJ SOLN
0.5000 mg | Freq: Once | INTRAMUSCULAR | Status: AC
  Administered 2023-07-02: 0.5 mg via INTRAVENOUS
  Filled 2023-07-02: qty 1

## 2023-07-02 MED ORDER — CARBIDOPA-LEVODOPA ER 25-100 MG PO TBCR
2.0000 | EXTENDED_RELEASE_TABLET | Freq: Three times a day (TID) | ORAL | Status: DC
  Administered 2023-07-02 – 2023-07-07 (×14): 2 via ORAL
  Filled 2023-07-02 (×16): qty 2

## 2023-07-02 MED ORDER — IOHEXOL 300 MG/ML  SOLN
100.0000 mL | Freq: Once | INTRAMUSCULAR | Status: AC | PRN
  Administered 2023-07-02: 100 mL via INTRAVENOUS

## 2023-07-02 MED ORDER — LOSARTAN POTASSIUM 50 MG PO TABS
50.0000 mg | ORAL_TABLET | Freq: Every day | ORAL | Status: DC
  Administered 2023-07-03 – 2023-07-07 (×5): 50 mg via ORAL
  Filled 2023-07-02 (×5): qty 1

## 2023-07-02 MED ORDER — TAMSULOSIN HCL 0.4 MG PO CAPS
0.4000 mg | ORAL_CAPSULE | Freq: Every day | ORAL | Status: DC
  Administered 2023-07-03 – 2023-07-07 (×5): 0.4 mg via ORAL
  Filled 2023-07-02 (×5): qty 1

## 2023-07-02 MED ORDER — PIPERACILLIN-TAZOBACTAM 3.375 G IVPB
3.3750 g | Freq: Three times a day (TID) | INTRAVENOUS | Status: DC
  Administered 2023-07-03 – 2023-07-06 (×11): 3.375 g via INTRAVENOUS
  Filled 2023-07-02 (×11): qty 50

## 2023-07-02 MED ORDER — SENNOSIDES-DOCUSATE SODIUM 8.6-50 MG PO TABS
1.0000 | ORAL_TABLET | Freq: Every day | ORAL | Status: DC
  Administered 2023-07-02 – 2023-07-04 (×3): 1 via ORAL
  Filled 2023-07-02 (×3): qty 1

## 2023-07-02 MED ORDER — ROPINIROLE HCL 1 MG PO TABS
2.0000 mg | ORAL_TABLET | Freq: Three times a day (TID) | ORAL | Status: DC
  Administered 2023-07-02 – 2023-07-07 (×14): 2 mg via ORAL
  Filled 2023-07-02 (×14): qty 2

## 2023-07-02 MED ORDER — BUSPIRONE HCL 5 MG PO TABS
10.0000 mg | ORAL_TABLET | Freq: Three times a day (TID) | ORAL | Status: DC
  Administered 2023-07-02 – 2023-07-07 (×14): 10 mg via ORAL
  Filled 2023-07-02 (×14): qty 2

## 2023-07-02 MED ORDER — AMLODIPINE BESYLATE 5 MG PO TABS
5.0000 mg | ORAL_TABLET | Freq: Every day | ORAL | Status: DC
  Administered 2023-07-03 – 2023-07-07 (×5): 5 mg via ORAL
  Filled 2023-07-02 (×5): qty 1

## 2023-07-02 MED ORDER — ATORVASTATIN CALCIUM 20 MG PO TABS
40.0000 mg | ORAL_TABLET | Freq: Every day | ORAL | Status: DC
  Administered 2023-07-03 – 2023-07-07 (×5): 40 mg via ORAL
  Filled 2023-07-02 (×5): qty 2

## 2023-07-02 MED ORDER — HYDROMORPHONE HCL 1 MG/ML IJ SOLN
0.5000 mg | INTRAMUSCULAR | Status: DC | PRN
  Administered 2023-07-02 – 2023-07-06 (×13): 0.5 mg via INTRAVENOUS
  Filled 2023-07-02 (×14): qty 0.5

## 2023-07-02 MED ORDER — PIPERACILLIN-TAZOBACTAM 4.5 G IVPB: 4.5000 g | Freq: Once | INTRAVENOUS | Status: DC

## 2023-07-02 MED ORDER — TESTOSTERONE CYPIONATE 200 MG/ML IM SOLN
200.0000 mg | Freq: Once | INTRAMUSCULAR | Status: AC
Start: 2023-07-02 — End: 2023-07-02
  Administered 2023-07-02: 200 mg via INTRAMUSCULAR

## 2023-07-02 MED ORDER — POLYETHYLENE GLYCOL 3350 17 G PO PACK
17.0000 g | PACK | Freq: Every day | ORAL | Status: DC
  Administered 2023-07-05: 17 g via ORAL
  Filled 2023-07-02 (×2): qty 1

## 2023-07-02 MED ORDER — PIPERACILLIN-TAZOBACTAM 3.375 G IVPB
3.3750 g | Freq: Once | INTRAVENOUS | Status: AC
  Administered 2023-07-02: 3.375 g via INTRAVENOUS
  Filled 2023-07-02: qty 50

## 2023-07-02 MED ORDER — ENOXAPARIN SODIUM 40 MG/0.4ML IJ SOSY: 40.0000 mg | PREFILLED_SYRINGE | INTRAMUSCULAR | Status: DC

## 2023-07-02 MED ORDER — ACETAMINOPHEN 325 MG PO TABS
650.0000 mg | ORAL_TABLET | Freq: Four times a day (QID) | ORAL | Status: DC
  Administered 2023-07-02 – 2023-07-07 (×14): 650 mg via ORAL
  Filled 2023-07-02 (×18): qty 2

## 2023-07-02 NOTE — Progress Notes (Signed)
   07/02/23 2336  BiPAP/CPAP/SIPAP  BiPAP/CPAP/SIPAP Pt Type Adult  Reason BIPAP/CPAP not in use Non-compliant (Patient refused CPAP qhs.  Patient states that he has not worn his cpap since loosing a substantial amount of weight and will have his son bring his should he need it.)

## 2023-07-02 NOTE — Assessment & Plan Note (Signed)
BP is well controlled at this time.  We may need to looks at reduction in medication if BP continues to trend down.
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
## 2023-07-02 NOTE — Progress Notes (Signed)
Jonathan Yu - 72 y.o. male MRN [id number]  Date of birth: [birth year]  Subjective Chief Complaint  Patient presents with   Abdominal Pain    HPI Jonathan Yu is a 72 y.o. male here today for follow up visit.   He reports that he is having increased pain and swelling in the RUQ. S/p subtotal cholecystectomy.  This was complicated by bile leak, managed by ERCP.  CT on 10/9 with multiple stones in the gallbladder fossa and fat stranding with hypodensity suggestive of inflammation.  At visit on 10/22 with surgeon he reported improving symptoms.  Over the past fee days his pain has gotten worse and he notes swelling over the RUQ.  Denies fever or chills.  He has needed dilaudid to manage his pain.   Continues to see neurology for management of Parkinson's.  Remains on Sinemet and Requip.  Symptoms are fairly stable.   BP is treated with amlodipine.  Doing well with this. Denies significant side effects.  He has not had anginal symptoms, headache or vision changes. He has been working on making dietary changes.   Diabetes is managed with glipizide and metformin.  Tolerating well and denies hypoglycemia.    ROS:  A comprehensive ROS was completed and negative except as noted per HPI    Allergies  Allergen Reactions   Sesame Oil Hives and Shortness Of Breath   Clonazepam Hives    Tolerates Ativan   Soy Allergy Hives   Tape Other (See Comments)    White surgical tape caused some blisters.   Percocet [Oxycodone-Acetaminophen] Rash   Topamax [Topiramate] Other (See Comments)    Cognitive decline   Vicodin [Hydrocodone-Acetaminophen] Rash    Past Medical History:  Diagnosis Date   Allergy    Anxiety    Asthma    Cataract    DDD (degenerative disc disease), lumbar    GERD (gastroesophageal reflux disease)    Hyperlipidemia    "no meds since losing 106# 2 yr ago" (10/06/2013)   Hypertension 08/12/2010   "no meds since losing 106# 2 yr ago" (10/06/2013)   Neuromuscular disorder  (HCC)    OSA on CPAP    "don't wear mask much since losing 106# 2 yr ago" (10/06/2013)   Parkinson's disease (HCC)    Sleep apnea    Type II diabetes mellitus (HCC)    "no meds since losing 106# 2 yr ago" (10/06/2013)   Walking pneumonia 08/12/1966    Past Surgical History:  Procedure Laterality Date   ANKLE FRACTURE SURGERY Left 08/13/1971   ANKLE RECONSTRUCTION Right 05/13/2011   BILIARY STENT PLACEMENT N/A 03/18/2023   Procedure: BILIARY STENT PLACEMENT;  Surgeon: Vida Rigger, MD;  Location: [facility];  Service: Gastroenterology;  Laterality: N/A;   CARPAL TUNNEL RELEASE Bilateral 08/13/1999   CHOLECYSTECTOMY N/A 03/14/2023   Procedure: LAPAROSCOPIC SUBTOTAL CHOLECYSTECTOMY WITH ICG DYE;  Surgeon: Andria Meuse, MD;  Location: [facility];  Service: General;  Laterality: N/A;   ERCP N/A 03/18/2023   Procedure: ENDOSCOPIC RETROGRADE CHOLANGIOPANCREATOGRAPHY (ERCP);  Surgeon: Vida Rigger, MD;  Location: [facility];  Service: Gastroenterology;  Laterality: N/A;   ESOPHAGOGASTRODUODENOSCOPY (EGD) WITH PROPOFOL N/A 04/29/2023   Procedure: ESOPHAGOGASTRODUODENOSCOPY (EGD) WITH PROPOFOL;  Surgeon: Vida Rigger, MD;  Location: [facility];  Service: Gastroenterology;  Laterality: N/A;   FRACTURE SURGERY     GASTROINTESTINAL STENT REMOVAL N/A 04/29/2023   Procedure: GASTROINTESTINAL STENT REMOVAL;  Surgeon: Vida Rigger, MD;  Location: [facility];  Service: Gastroenterology;  Laterality: N/A;   HARDWARE  REMOVAL Right 10/07/2013   Procedure: RIGHT ANKLE HARDWARE REMOVAL;  Surgeon: Nadara Mustard, MD;  Location: [facility];  Service: Orthopedics;  Laterality: Right;   I & D EXTREMITY Right 11/18/2013   Procedure: IRRIGATION AND DEBRIDEMENT EXTREMITY;  Surgeon: Nadara Mustard, MD;  Location: [facility];  Service: Orthopedics;  Laterality: Right;  Irrigation and Debridement Right Fibula , Place Antibiotic Beads and VAC   JOINT REPLACEMENT     NASAL SEPTUM SURGERY  08/12/1980   ORIF ANKLE FRACTURE  Right 09/08/2013   Procedure: REPAIR SYNDESMOSIS DISRUPTION RIGHT ANKLE;  Surgeon: Nadara Mustard, MD;  Location: [facility];  Service: Orthopedics;  Laterality: Right;   SHOULDER ARTHROSCOPY W/ ROTATOR CUFF REPAIR Right 2006; 2009   SPHINCTEROTOMY  03/18/2023   Procedure: SPHINCTEROTOMY;  Surgeon: Vida Rigger, MD;  Location: [facility];  Service: Gastroenterology;;   TONSILLECTOMY  08/12/1957   TOTAL ANKLE ARTHROPLASTY Right 08/18/2013   Procedure: TOTAL ANKLE ARTHOPLASTY;  Surgeon: Nadara Mustard, MD;  Location: [facility];  Service: Orthopedics;  Laterality: Right;  Right Total Ankle Arthroplasty, Revision Fibular Fracture   URETERAL STENT PLACEMENT  ~ 2010 X 2    Social History   Socioeconomic History   Marital status: Divorced    Spouse name: Not on file   Number of children: 6   Years of education: 14   Highest education level: Some college, no degree  Occupational History   Occupation: sales/ DJ    Comment: part time  Tobacco Use   Smoking status: Some Days    Types: Pipe, Cigars   Smokeless tobacco: Never   Tobacco comments:    Just a few cigars per year  Vaping Use   Vaping status: Never Used  Substance and Sexual Activity   Alcohol use: Not Currently    Comment: 1-2 drinks per year.   Drug use: No   Sexual activity: Not Currently    Birth control/protection: Abstinence  Other Topics Concern   Not on file  Social History Narrative   Lives with his son and his daughter. He does Hotel manager, enjoys singing, music and yard work (when he can).    Social Determinants of Health   Financial Resource Strain: Low Risk  (05/05/2023)   Overall Financial Resource Strain (CARDIA)    Difficulty of Paying Living Expenses: Not very hard  Food Insecurity: No Food Insecurity (05/05/2023)   Hunger Vital Sign    Worried About Running Out of Food in the Last Year: Never true    Ran Out of Food in the Last Year: Never true  Transportation Needs: No Transportation Needs (05/05/2023)   PRAPARE -  Administrator, Civil Service (Medical): No    Lack of Transportation (Non-Medical): No  Physical Activity: Inactive (05/05/2023)   Exercise Vital Sign    Days of Exercise per Week: 0 days    Minutes of Exercise per Session: 0 min  Stress: Stress Concern Present (05/05/2023)   [employer] of Occupational Health - Occupational Stress Questionnaire    Feeling of Stress : Rather much  Social Connections: Moderately Isolated (05/06/2023)   Social Connection and Isolation Panel [NHANES]    Frequency of Communication with Friends and Family: More than three times a week    Frequency of Social Gatherings with Friends and Family: More than three times a week    Attends Religious Services: More than 4 times per year    Active Member of [employer] or Organizations: No    Attends [employer]  or Organization Meetings: Never    Marital Status: Divorced    Family History  Problem Relation Age of Onset   Cancer Mother 2       Lung   Alzheimer's disease Father    Parkinson's disease Father    Early death Sister    Scoliosis Sister    Cleft lip Sister     Health Maintenance  Topic Date Due   COVID-19 Vaccine (3 - Pfizer risk series) 12/08/2019   OPHTHALMOLOGY EXAM  01/11/2023   HEMOGLOBIN A1C  09/13/2023   Diabetic kidney evaluation - Urine ACR  12/26/2023   FOOT EXAM  12/26/2023   Medicare Annual Wellness (AWV)  05/05/2024   Diabetic kidney evaluation - eGFR measurement  05/20/2024   Colonoscopy  06/03/2029   DTaP/Tdap/Td (3 - Td or Tdap) 05/11/2031   Pneumonia Vaccine 11+ Years old  Completed   INFLUENZA VACCINE  Completed   Hepatitis C Screening  Completed   Zoster Vaccines- Shingrix  Completed   HPV VACCINES  Aged Out     ----------------------------------------------------------------------------------------------------------------------------------------------------------------------------------------------------------------- Physical Exam BP 101/66 (BP Location: Left Arm,  Patient Position: Sitting, Cuff Size: Large)   Pulse 93   Ht 5\' 5"  (1.651 m)   Wt 239 lb (108.4 kg)   SpO2 94%   BMI 39.77 kg/m   Physical Exam Constitutional:      Appearance: He is ill-appearing. He is not toxic-appearing.  HENT:     Head: Normocephalic and atraumatic.  Abdominal:     General: There is no distension.     Palpations: Abdomen is soft.     Tenderness: There is abdominal tenderness (RUQ tenderness.).     Comments: Swollen nodule over RUQ.  Neurological:     Mental Status: He is alert.     ------------------------------------------------------------------------------------------------------------------------------------------------------------------------------------------------------------------- Assessment and Plan  Essential hypertension, benign BP is well controlled at this time.  We may need to looks at reduction in medication if BP continues to trend down.   RUQ pain RUQ pain with swollen nodule.  Given complications with his recent cholecystectomy and increasing pain I have advised evaluation in the ED for emergent imaging and surgical consultation.     No orders of the defined types were placed in this encounter.   No follow-ups on file.    This visit occurred during the SARS-CoV-2 public health emergency.  Safety protocols were in place, including screening questions prior to the visit, additional usage of staff PPE, and extensive cleaning of exam room while observing appropriate contact time as indicated for disinfecting solutions.

## 2023-07-02 NOTE — ED Provider Notes (Signed)
[place] EMERGENCY DEPARTMENT AT [facility] Provider Note  CSN: [id number] Arrival date & time: 07/02/23 1419  Chief Complaint(s) Abdominal Pain and Bloated  HPI Jonathan Yu is a 72 y.o. male history of hypertension, hyperlipidemia, Parkinson's disease, diabetes presenting to the emergency department with right upper quadrant pain.  Patient had complicated episode of cholecystitis, with delayed subtotal cholecystectomy.  He has had some abdominal pain since this but over the past week has increased, having episodic nausea, no vomiting.  Reports decreased bowel movements but still passing gas.  No fevers or chills.  No chest pain or shortness of breath.  No lightheadedness or dizziness.  He also reports some "abnormal urination recently "but not specifically any dysuria. He noticed a "bump" in his RUQ   Past Medical History Past Medical History:  Diagnosis Date   Allergy    Anxiety    Asthma    Cataract    DDD (degenerative disc disease), lumbar    GERD (gastroesophageal reflux disease)    Hyperlipidemia    "no meds since losing 106# 2 yr ago" (10/06/2013)   Hypertension 08/12/2010   "no meds since losing 106# 2 yr ago" (10/06/2013)   Neuromuscular disorder (HCC)    OSA on CPAP    "don't wear mask much since losing 106# 2 yr ago" (10/06/2013)   Parkinson's disease (HCC)    Sleep apnea    Type II diabetes mellitus (HCC)    "no meds since losing 106# 2 yr ago" (10/06/2013)   Walking pneumonia 08/12/1966   Patient Active Problem List   Diagnosis Date Noted   RUQ pain 07/02/2023   Abscess 07/02/2023   Retained gallstones following laparoscopic subtotal cholecystectomy 05/21/2023   Bile leak 03/29/2023   Acute gangrenous cholecystitis s/p subtotal cholecysectomy 03/14/2023 03/12/2023   Primary osteoarthritis, left elbow 02/10/2023   Cyst of skin 12/26/2022   Elevated bilirubin 07/25/2022   Atopic dermatitis 07/15/2022   Plantar fasciitis of left foot 01/16/2022    Left leg swelling 01/29/2021   Parkinsonism (HCC) 01/29/2021   Joint pain 09/14/2020   Pharyngitis 08/01/2020   Insomnia 08/01/2020   Seborrheic keratoses 03/16/2020   Venous stasis dermatitis of both lower extremities 09/03/2019   Eustachian tube dysfunction, bilateral 08/16/2019   Anxiety state 08/16/2019   History of 2019 novel coronavirus disease (COVID-19) 08/16/2019   COVID-19 virus infection 08/07/2019   Hypogonadism in male 01/14/2018   Complete tear of left rotator cuff 10/02/2017   Chronic left shoulder pain 10/02/2017   GERD (gastroesophageal reflux disease) 07/02/2017   Rosacea 05/29/2017   Unilateral primary osteoarthritis, left knee 09/12/2016   Idiopathic chronic venous hypertension of both lower extremities with inflammation 09/12/2016   Muscle spasm 03/07/2016   Chronic bilateral ankle plain, right total ankle arthroplasty, left ankle posttraumatic osteoarthritis 08/18/2013   ED (erectile dysfunction) 09/10/2011   BPH (benign prostatic hyperplasia) 09/10/2011   Kidney stones 01/28/2011   Type 2 diabetes mellitus without complication, without long-term current use of insulin (HCC) 01/24/2011   OSA on CPAP 01/24/2011   Right lumbar radiculopathy 01/24/2011   Essential hypertension, benign 12/20/2010   Dyslipidemia 12/20/2010   Home Medication(s) Prior to Admission medications   Medication Sig Start Date End Date Taking? Authorizing Provider  AMBULATORY NON FORMULARY MEDICATION Continue CPAP at current settings.  Please provide new supplies. [name] phone number [phone] 06/03/19  Yes Rodolph Bong, MD  amLODipine (NORVASC) 5 MG tablet Take 1 tablet (5 mg total) by mouth daily. 04/29/23  Yes  Everrett Coombe, DO  anastrozole (ARIMIDEX) 1 MG tablet Take 1 tablet (1 mg total) by mouth every Monday, Wednesday, and Friday. 01/03/23  Yes Everrett Coombe, DO  APPLE CIDER VINEGAR PO Take 1 tablet by mouth See admin instructions. Chew 1 gummie by mouth once a day    Yes [provider]  atorvastatin (LIPITOR) 40 MG tablet TAKE 1 TABLET EVERY DAY Patient taking differently: Take 40 mg by mouth daily. 09/27/22  Yes Everrett Coombe, DO  Azelaic Acid 15 % cream Apply 1 application topically 2 (two) times daily. Patient taking differently: Apply 1 application  topically 2 (two) times daily as needed (skin flares- affected areas). 06/03/19  Yes Rodolph Bong, MD  busPIRone (BUSPAR) 5 MG tablet Take 1 tablet (5 mg total) by mouth 3 (three) times daily as needed. May take an optional 4th dose in a day if needed. Patient taking differently: Take 10 mg by mouth in the morning, at noon, and at bedtime. 04/10/23  Yes Huston Foley, MD  Carbidopa-Levodopa ER (SINEMET CR) 25-100 MG tablet controlled release Take 2 tablets by mouth See admin instructions. Take 2 tablets by mouth upon awakening, midday, and at bedtime 02/26/23 03/11/24 Yes [provider]  Cholecalciferol (VITAMIN D3) 50 MCG (2000 UT) TABS Take 2,000 Units by mouth every evening.   Yes [provider]  fluticasone (FLONASE) 50 MCG/ACT nasal spray Place 1 spray into both nostrils at bedtime as needed for allergies or rhinitis (or nasal congestion).   Yes [provider]  glipiZIDE (GLUCOTROL) 5 MG tablet TAKE 1 TABLET TWICE DAILY BEFORE MEALS Patient taking differently: Take 5 mg by mouth See admin instructions. Take 5 mg by mouth upon awakening and midday 04/24/23  Yes Everrett Coombe, DO  HYDROmorphone (DILAUDID) 2 MG tablet Take 1 tablet (2 mg total) by mouth every 6 (six) hours as needed for severe pain. 05/21/23  Yes Ernie Avena, MD  LINZESS 145 MCG CAPS capsule Take 145 mcg by mouth daily as needed (for constipation). 02/05/23  Yes [provider]  loratadine (CLARITIN) 10 MG tablet Take 10 mg by mouth daily as needed for allergies.   Yes [provider]  losartan (COZAAR) 50 MG tablet TAKE 1 TABLET EVERY DAY Patient taking differently: Take 50 mg by mouth  daily with breakfast. 09/27/22  Yes Everrett Coombe, DO  metFORMIN (GLUCOPHAGE-XR) 500 MG 24 hr tablet Take 1 tablet (500 mg total) by mouth 2 (two) times daily with a meal. Patient taking differently: Take 500 mg by mouth See admin instructions. Take 500 mg by mouth in the morning and midday 01/02/23  Yes Matthews, Cody, DO  mometasone (ELOCON) 0.1 % cream Apply 1 Application topically daily. Patient taking differently: Apply 1 Application topically See admin instructions. Apply to affected areas 1-2 times a week as needed for itching/dryness- rosacea 07/15/22  Yes Everrett Coombe, DO  Multiple Vitamin (MULTIVITAMIN WITH MINERALS) TABS tablet Take 1 tablet by mouth daily with breakfast.   Yes [provider]  NON FORMULARY Take 2 capsules by mouth See admin instructions. Yoli Alkalete - Calcium and Magnesium Supplement capsules- Take 2 capsules by mouth once a day   Yes [provider]  PRESCRIPTION MEDICATION CPAP- At bedtime   Yes [provider]  Probiotic Product (PROBIOTIC PO) Take 2 capsules by mouth daily.   Yes [provider]  rOPINIRole (REQUIP) 2 MG tablet TAKE 1 TABLET THREE TIMES DAILY 10/14/22  Yes Huston Foley, MD  tamsulosin (FLOMAX) 0.4 MG CAPS  capsule Take 2 capsules (0.8 mg total) by mouth daily. Patient taking differently: Take 0.4 mg by mouth daily. 01/02/23  Yes Everrett Coombe, DO  Testosterone Cypionate 200 MG/ML SOLN Inject 200 mg into the muscle every 14 (fourteen) days.   Yes [provider]  traMADol (ULTRAM) 50 MG tablet Take 50 mg by mouth every 6 (six) hours as needed (for neuropathic pain).   Yes [provider]  triamcinolone cream (KENALOG) 0.1 % Apply 1 Application topically daily as needed (flares). 02/03/23  Yes [provider]  albuterol (PROVENTIL HFA;VENTOLIN HFA) 108 (90 Base) MCG/ACT inhaler Inhale 2 puffs into the lungs every 6 (six) hours as needed for wheezing or shortness of breath. Patient not  taking: Reported on 07/02/2023 07/31/17   Rodolph Bong, MD  AMBULATORY NON FORMULARY MEDICATION Compression stockings.  Vive size LGM Leg swelling. Disp 1 pair 99 refil. 06/09/19   Rodolph Bong, MD  AMBULATORY NON FORMULARY MEDICATION Please provide:  12 inch grab bar x2 Trapeze bar to assist with getting out of bed.  Dx: R53.81, Z90.49, K83.9 03/27/23   Everrett Coombe, DO  furosemide (LASIX) 20 MG tablet TAKE 1 TABLET BY MOUTH ONCE DAILY AS NEEDED FOR EDEMA Patient not taking: Reported on 07/02/2023 03/12/22   Everrett Coombe, DO  Misc. Devices (FLEX THERAPY) MISC by Does not apply route.    [provider]  ondansetron (ZOFRAN) 4 MG tablet Take 1 tablet (4 mg total) by mouth every 6 (six) hours as needed for nausea. Patient not taking: Reported on 07/02/2023 03/24/23   Alba Cory, MD                                                                                                                                    Past Surgical History Past Surgical History:  Procedure Laterality Date   ANKLE FRACTURE SURGERY Left 08/13/1971   ANKLE RECONSTRUCTION Right 05/13/2011   BILIARY STENT PLACEMENT N/A 03/18/2023   Procedure: BILIARY STENT PLACEMENT;  Surgeon: Vida Rigger, MD;  Location: [facility];  Service: Gastroenterology;  Laterality: N/A;   CARPAL TUNNEL RELEASE Bilateral 08/13/1999   CHOLECYSTECTOMY N/A 03/14/2023   Procedure: LAPAROSCOPIC SUBTOTAL CHOLECYSTECTOMY WITH ICG DYE;  Surgeon: Andria Meuse, MD;  Location: [facility] ORS;  Service: General;  Laterality: N/A;   ERCP N/A 03/18/2023   Procedure: ENDOSCOPIC RETROGRADE CHOLANGIOPANCREATOGRAPHY (ERCP);  Surgeon: Vida Rigger, MD;  Location: [facility];  Service: Gastroenterology;  Laterality: N/A;   ESOPHAGOGASTRODUODENOSCOPY (EGD) WITH PROPOFOL N/A 04/29/2023   Procedure: ESOPHAGOGASTRODUODENOSCOPY (EGD) WITH PROPOFOL;  Surgeon: Vida Rigger, MD;  Location: [facility];  Service: Gastroenterology;  Laterality: N/A;    FRACTURE SURGERY     GASTROINTESTINAL STENT REMOVAL N/A 04/29/2023   Procedure: GASTROINTESTINAL STENT REMOVAL;  Surgeon: Vida Rigger, MD;  Location: [facility];  Service: Gastroenterology;  Laterality: N/A;   HARDWARE REMOVAL Right 10/07/2013   Procedure: RIGHT ANKLE HARDWARE REMOVAL;  Surgeon:  Nadara Mustard, MD;  Location: [facility] OR;  Service: Orthopedics;  Laterality: Right;   I & D EXTREMITY Right 11/18/2013   Procedure: IRRIGATION AND DEBRIDEMENT EXTREMITY;  Surgeon: Nadara Mustard, MD;  Location: [facility] OR;  Service: Orthopedics;  Laterality: Right;  Irrigation and Debridement Right Fibula , Place Antibiotic Beads and VAC   JOINT REPLACEMENT     NASAL SEPTUM SURGERY  08/12/1980   ORIF ANKLE FRACTURE Right 09/08/2013   Procedure: REPAIR SYNDESMOSIS DISRUPTION RIGHT ANKLE;  Surgeon: Nadara Mustard, MD;  Location: [facility] OR;  Service: Orthopedics;  Laterality: Right;   SHOULDER ARTHROSCOPY W/ ROTATOR CUFF REPAIR Right 2006; 2009   SPHINCTEROTOMY  03/18/2023   Procedure: SPHINCTEROTOMY;  Surgeon: Vida Rigger, MD;  Location: [facility];  Service: Gastroenterology;;   TONSILLECTOMY  08/12/1957   TOTAL ANKLE ARTHROPLASTY Right 08/18/2013   Procedure: TOTAL ANKLE ARTHOPLASTY;  Surgeon: Nadara Mustard, MD;  Location: [facility] OR;  Service: Orthopedics;  Laterality: Right;  Right Total Ankle Arthroplasty, Revision Fibular Fracture   URETERAL STENT PLACEMENT  ~ 2010 X 2   Family History Family History  Problem Relation Age of Onset   Cancer Mother 39       Lung   Alzheimer's disease Father    Parkinson's disease Father    Early death Sister    Scoliosis Sister    Cleft lip Sister     Social History Social History   Tobacco Use   Smoking status: Some Days    Types: Pipe, Cigars   Smokeless tobacco: Never   Tobacco comments:    Just a few cigars per year  Vaping Use   Vaping status: Never Used  Substance Use Topics   Alcohol use: Not Currently    Comment: 1-2 drinks per year.   Drug use: No    Allergies Sesame oil, Clonazepam, Tape, Percocet [oxycodone-acetaminophen], and Topamax [topiramate]  Review of Systems Review of Systems  All other systems reviewed and are negative.   Physical Exam Vital Signs  I have reviewed the triage vital signs BP 109/67 (BP Location: Left Arm)   Pulse 87   Temp 98.3 F (36.8 C) (Oral)   Resp 18   Ht 5\' 5"  (1.651 m)   Wt 104.3 kg   SpO2 94%   BMI 38.27 kg/m  Physical Exam Vitals and nursing note reviewed.  Constitutional:      General: He is not in acute distress.    Appearance: Normal appearance.  HENT:     Mouth/Throat:     Mouth: Mucous membranes are moist.  Eyes:     Conjunctiva/sclera: Conjunctivae normal.  Cardiovascular:     Rate and Rhythm: Normal rate and regular rhythm.  Pulmonary:     Effort: Pulmonary effort is normal. No respiratory distress.     Breath sounds: Normal breath sounds.  Abdominal:     General: Abdomen is flat.     Palpations: Abdomen is soft. There is mass (small RUQ area of induration with ttp and mild erythema overlying).     Tenderness: There is abdominal tenderness in the right upper quadrant.  Musculoskeletal:     Right lower leg: No edema.     Left lower leg: No edema.  Skin:    General: Skin is warm and dry.     Capillary Refill: Capillary refill takes less than 2 seconds.  Neurological:     Mental Status: He is alert and oriented to person, place, and time. Mental status is at baseline.  Psychiatric:        Mood and Affect: Mood normal.        Behavior: Behavior normal.     ED Results and Treatments Labs (all labs ordered are listed, but only abnormal results are displayed) Labs Reviewed  COMPREHENSIVE METABOLIC PANEL - Abnormal; Notable for the following components:      Result Value   Albumin 3.2 (*)    AST 14 (*)    Total Bilirubin 1.3 (*)    All other components within normal limits  CBC WITH DIFFERENTIAL/PLATELET - Abnormal; Notable for the following components:   WBC  10.7 (*)    Neutro Abs 8.8 (*)    All other components within normal limits  URINALYSIS, W/ REFLEX TO CULTURE (INFECTION SUSPECTED) - Abnormal; Notable for the following components:   Ketones, ur 5 (*)    All other components within normal limits  LIPASE, BLOOD  HEMOGLOBIN A1C  CBC  PROTIME-INR  APTT  BASIC METABOLIC PANEL                                                                                                                          Radiology CT ABDOMEN PELVIS W CONTRAST  Result Date: 07/02/2023 CLINICAL DATA:  Abdominal pain, postop, right-sided pain. Lump in right abdomen. Patient post subtotal cholecystectomy in August. EXAM: CT ABDOMEN AND PELVIS WITH CONTRAST TECHNIQUE: Multidetector CT imaging of the abdomen and pelvis was performed using the standard protocol following bolus administration of intravenous contrast. RADIATION DOSE REDUCTION: This exam was performed according to the departmental dose-optimization program which includes automated exposure control, adjustment of the mA and/or kV according to patient size and/or use of iterative reconstruction technique. CONTRAST:  OMNIPAQUE IOHEXOL 300 MG/ML  SOLN COMPARISON:  CT 05/21/2023, CT 03/17/2023 FINDINGS: Lower chest: The heart is upper normal in size. No acute airspace disease. Hepatobiliary: Patient is post subtotal cholecystectomy. Decreased stone burden in the gallbladder fossa with a residual calcified stone. Heterogeneous soft tissue density in the gallbladder fossa with foci of gas, similar in size from prior although the gas is increasing. This is contiguous with a soft tissue tract at site of prior drain extending anteriorly in the right abdomen extending subjacent and within the abdominal wall musculature. Overall increase in size and complexity from prior exam. This is contiguous with a heterogeneous collection in the right anterior abdominal wall musculature measuring 5.2 x 3.7 cm series 2, image 35, new from  prior exam. No common bile duct dilatation or choledocholithiasis. No intrahepatic biliary ductal dilatation. No evidence of focal hepatic lesion. Hepatic steatosis again seen. Pancreas: No ductal dilatation or inflammation. Spleen: Normal in size without focal abnormality. Adrenals/Urinary Tract: No adrenal nodule. Punctate stone in the left kidney. No hydronephrosis or renal inflammation. Decompressed ureters. Partially distended urinary bladder. Mild bladder wall thickening which may be chronic. Stomach/Bowel: There is loss of fat plane between the distal stomach/proximal duodenum and the gallbladder fossa, series 2, image 24. There is  also loss of fat plane between the tract in the hepatic flexure of the colon, coronal series 7, image 47. There is no bowel obstruction. Small to moderate volume of stool in the colon. The appendix is normal. Vascular/Lymphatic: Aortic atherosclerosis. No aortic aneurysm. Prominent portal caval node measuring 9 mm series 2, image 28. Reproductive: Enlarged prostate gland causing mass effect on the bladder base. Other: Tract in the right abdomen at site of prior drain as described above. Tract is increased in size with surrounding inflammation and internal foci of gas. New fluid collection in the right anterior abdominal wall musculature as described. No frank free fluid or ascites. Musculoskeletal: Chronic right L5 pars defect. Again seen degenerative change in the spine. No new osseous findings. IMPRESSION: 1. Patient is post subtotal cholecystectomy. Heterogeneous soft tissue density in the gallbladder fossa with foci of gas, similar in size from prior although the internal gas is increasing. This is contiguous with a soft tissue tract at site of prior drain extending anteriorly in the right abdomen extending subjacent and within the abdominal wall musculature. Overall increase in size and complexity from prior exam. This is contiguous with a heterogeneous collection in the right  anterior abdominal wall musculature measuring 5.2 x 3.7 cm, new from prior exam. Findings are concerning for abscess. 2. Loss of fat plane between the distal stomach/proximal duodenum and the gallbladder fossa. There is also loss of fat plane between the drain tract and the hepatic flexure of the colon. Findings raise concern for enteric fistula. Recommend surgical follow-up. 3. Decreased stone burden in the gallbladder fossa with a residual calcified stone. 4. Hepatic steatosis. 5. Punctate left renal stone. 6. Enlarged prostate gland causing mass effect on the bladder base. Mild bladder wall thickening which may be chronic. Aortic Atherosclerosis (ICD10-I70.0). These results were called by telephone at the time of interpretation on 07/02/2023 at 5:42 pm to provider Alvino Blood , who verbally acknowledged these results. Electronically Signed   By: Narda Rutherford M.D.   On: 07/02/2023 17:42    Pertinent labs & imaging results that were available during my care of the patient were reviewed by me and considered in my medical decision making (see MDM for details).  Medications Ordered in ED Medications  piperacillin-tazobactam (ZOSYN) IVPB 3.375 g (3.375 g Intravenous New Bag/Given 07/02/23 1824)  Carbidopa-Levodopa ER (SINEMET CR) 25-100 MG tablet controlled release 2 tablet (has no administration in time range)  rOPINIRole (REQUIP) tablet 2 mg (has no administration in time range)  tamsulosin (FLOMAX) capsule 0.4 mg (has no administration in time range)  losartan (COZAAR) tablet 50 mg (has no administration in time range)  busPIRone (BUSPAR) tablet 10 mg (has no administration in time range)  atorvastatin (LIPITOR) tablet 40 mg (has no administration in time range)  amLODipine (NORVASC) tablet 5 mg (has no administration in time range)  HYDROmorphone (DILAUDID) injection 0.5 mg (has no administration in time range)  acetaminophen (TYLENOL) tablet 650 mg (has no administration in time range)   polyethylene glycol (MIRALAX / GLYCOLAX) packet 17 g (has no administration in time range)  senna-docusate (Senokot-S) tablet 1 tablet (has no administration in time range)  enoxaparin (LOVENOX) injection 40 mg (has no administration in time range)  piperacillin-tazobactam (ZOSYN) IVPB 3.375 g (has no administration in time range)  HYDROmorphone (DILAUDID) injection 0.5 mg (0.5 mg Intravenous Given 07/02/23 1545)  sodium chloride 0.9 % bolus 1,000 mL (0 mLs Intravenous Stopped 07/02/23 1827)  iohexol (OMNIPAQUE) 300 MG/ML solution 100 mL (100 mLs  Intravenous Contrast Given 07/02/23 1650)  HYDROmorphone (DILAUDID) injection 0.5 mg (0.5 mg Intravenous Given 07/02/23 1819)                                                                                                                                     Procedures Procedures  (including critical care time)  Medical Decision Making / ED Course   MDM:  50-year-old presenting to the emergency department with abdominal pain.  Patient overall well-appearing, vitals reassuring.  He does have some right upper quadrant tenderness.  He does have a small mass in his right upper quadrant  Differential includes postoperative complication such as abscess, inflammation, obstruction, perforation.  Could also represent soft tissue abscess not involving abdominal compartment.  Will obtain laboratory testing.  Will obtain CT of the abdomen to further evaluate.  He reports some vague urinary symptoms we will check a urinalysis although is not really having any dysuria.  Clinical Course as of 07/02/23 1936  Wed Jul 02, 2023  1610 CT shows tract from side of gallbladder surgery extending into the abdominal wall abscess, could also represent enterocutaneous fistula.  Discussed with general surgery and hospitalist.  General surgery will consult and hospitalist will admit. [WS]    Clinical Course User Index [WS] Suezanne Jacquet, Jerilee Field, MD     Additional history  obtained: -Additional history obtained from family -External records from outside source obtained and reviewed including: Chart review including previous notes, labs, imaging, consultation notes including prior surgery notes and CT scans    Lab Tests: -I ordered, reviewed, and interpreted labs.   The pertinent results include:   Labs Reviewed  COMPREHENSIVE METABOLIC PANEL - Abnormal; Notable for the following components:      Result Value   Albumin 3.2 (*)    AST 14 (*)    Total Bilirubin 1.3 (*)    All other components within normal limits  CBC WITH DIFFERENTIAL/PLATELET - Abnormal; Notable for the following components:   WBC 10.7 (*)    Neutro Abs 8.8 (*)    All other components within normal limits  URINALYSIS, W/ REFLEX TO CULTURE (INFECTION SUSPECTED) - Abnormal; Notable for the following components:   Ketones, ur 5 (*)    All other components within normal limits  LIPASE, BLOOD  HEMOGLOBIN A1C  CBC  PROTIME-INR  APTT  BASIC METABOLIC PANEL    Notable for leukocytosis, no AKI   Imaging Studies ordered: I ordered imaging studies including CT abdomen On my interpretation imaging demonstrates abscess  I independently visualized and interpreted imaging. I agree with the radiologist interpretation   Medicines ordered and prescription drug management: Meds ordered this encounter  Medications   HYDROmorphone (DILAUDID) injection 0.5 mg   sodium chloride 0.9 % bolus 1,000 mL   iohexol (OMNIPAQUE) 300 MG/ML solution 100 mL   DISCONTD: piperacillin-tazobactam (ZOSYN) IVPB 4.5 g   piperacillin-tazobactam (ZOSYN) IVPB 3.375 g    Order  Specific Question:   Antibiotic Indication:    Answer:   Intra-abdominal Infection   HYDROmorphone (DILAUDID) injection 0.5 mg   Carbidopa-Levodopa ER (SINEMET CR) 25-100 MG tablet controlled release 2 tablet    Take 2 tablets by mouth upon awakening, midday, and at bedtime     rOPINIRole (REQUIP) tablet 2 mg   tamsulosin (FLOMAX) capsule  0.4 mg   losartan (COZAAR) tablet 50 mg   busPIRone (BUSPAR) tablet 10 mg    May take an optional 4th dose in a day if needed.     atorvastatin (LIPITOR) tablet 40 mg   amLODipine (NORVASC) tablet 5 mg   HYDROmorphone (DILAUDID) injection 0.5 mg   acetaminophen (TYLENOL) tablet 650 mg   polyethylene glycol (MIRALAX / GLYCOLAX) packet 17 g   senna-docusate (Senokot-S) tablet 1 tablet   DISCONTD: enoxaparin (LOVENOX) injection 40 mg   enoxaparin (LOVENOX) injection 40 mg   piperacillin-tazobactam (ZOSYN) IVPB 3.375 g    Order Specific Question:   Antibiotic Indication:    Answer:   Intra-abdominal Infection    -I have reviewed the patients home medicines and have made adjustments as needed   Consultations Obtained: I requested consultation with the general surgeon,  and discussed lab and imaging findings as well as pertinent plan - they recommend: admission   Cardiac Monitoring: The patient was maintained on a cardiac monitor.  I personally viewed and interpreted the cardiac monitored which showed an underlying rhythm of: NSR  Reevaluation: After the interventions noted above, I reevaluated the patient and found that their symptoms have improved  Co morbidities that complicate the patient evaluation  Past Medical History:  Diagnosis Date   Allergy    Anxiety    Asthma    Cataract    DDD (degenerative disc disease), lumbar    GERD (gastroesophageal reflux disease)    Hyperlipidemia    "no meds since losing 106# 2 yr ago" (10/06/2013)   Hypertension 08/12/2010   "no meds since losing 106# 2 yr ago" (10/06/2013)   Neuromuscular disorder (HCC)    OSA on CPAP    "don't wear mask much since losing 106# 2 yr ago" (10/06/2013)   Parkinson's disease (HCC)    Sleep apnea    Type II diabetes mellitus (HCC)    "no meds since losing 106# 2 yr ago" (10/06/2013)   Walking pneumonia 08/12/1966      Dispostion: Disposition decision including need for hospitalization was considered,  and patient admitted to the hospital.    Final Clinical Impression(s) / ED Diagnoses Final diagnoses:  Abscess after procedure     This chart was dictated using voice recognition software.  Despite best efforts to proofread,  errors can occur which can change the documentation meaning.    Lonell Grandchild, MD 07/02/23 [phone]
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
## 2023-07-02 NOTE — Hospital Course (Addendum)
03/12/2023 - cholecystectomy with gangrenous cholecystitis with a subtotal cholecystectomy complicated by biliary leak, common bile duct stent placement and a percutaneous drain which have been removed    Increased pain past couple of weeks Bump on skin  -abscess in abdominal wall and sinus tract -possible EC fistula  Surgery consulted    Lump on Sunday night Stabbing pain bad past few days -moderate pain past few weeks  No fevers Chills last night

## 2023-07-02 NOTE — Addendum Note (Signed)
Addended by: Ardyth Man on: 07/02/2023 02:02 PM   Modules accepted: Orders
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
## 2023-07-02 NOTE — ED Triage Notes (Signed)
Pt c/o "lump" in R side abdomen and bloating x3 days.  Pain score 7/10.  Pt reports taking Dilaudid for pain.  Pt reports complications w/ gallbladder removal 8/1.    Pt has called Surgeon and was referred to ED.

## 2023-07-02 NOTE — Assessment & Plan Note (Signed)
RUQ pain with swollen nodule.  Given complications with his recent cholecystectomy and increasing pain I have advised evaluation in the ED for emergent imaging and surgical consultation.
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
## 2023-07-03 ENCOUNTER — Inpatient Hospital Stay (HOSPITAL_COMMUNITY): Payer: Medicare HMO | Admitting: Anesthesiology

## 2023-07-03 ENCOUNTER — Encounter (HOSPITAL_COMMUNITY): Payer: Self-pay | Admitting: Student

## 2023-07-03 ENCOUNTER — Ambulatory Visit: Payer: Medicare HMO | Admitting: Family Medicine

## 2023-07-03 ENCOUNTER — Other Ambulatory Visit: Payer: Self-pay

## 2023-07-03 ENCOUNTER — Encounter (HOSPITAL_COMMUNITY)
Admission: EM | Disposition: A | Discharge status: Home Health | Payer: Self-pay | Source: Home / Self Care | Attending: Internal Medicine

## 2023-07-03 DIAGNOSIS — G4733 Obstructive sleep apnea (adult) (pediatric): Secondary | ICD-10-CM

## 2023-07-03 DIAGNOSIS — E119 Type 2 diabetes mellitus without complications: Secondary | ICD-10-CM

## 2023-07-03 DIAGNOSIS — L02211 Cutaneous abscess of abdominal wall: Secondary | ICD-10-CM | POA: Diagnosis not present

## 2023-07-03 DIAGNOSIS — L0291 Cutaneous abscess, unspecified: Secondary | ICD-10-CM

## 2023-07-03 DIAGNOSIS — E785 Hyperlipidemia, unspecified: Secondary | ICD-10-CM | POA: Diagnosis not present

## 2023-07-03 DIAGNOSIS — I10 Essential (primary) hypertension: Secondary | ICD-10-CM

## 2023-07-03 DIAGNOSIS — K632 Fistula of intestine: Secondary | ICD-10-CM

## 2023-07-03 DIAGNOSIS — G20C Parkinsonism, unspecified: Secondary | ICD-10-CM

## 2023-07-03 HISTORY — PX: LIPOMA EXCISION: SHX5283

## 2023-07-03 LAB — BASIC METABOLIC PANEL
Anion gap: 11 (ref 5–15)
BUN: 13 mg/dL (ref 8–23)
CO2: 26 mmol/L (ref 22–32)
Calcium: 9 mg/dL (ref 8.9–10.3)
Chloride: 99 mmol/L (ref 98–111)
Creatinine, Ser: 0.96 mg/dL (ref 0.61–1.24)
GFR, Estimated: >60 mL/min (ref >60)
Glucose, Bld: 92 mg/dL (ref 70–99)
Potassium: 4.3 mmol/L (ref 3.5–5.1)
Sodium: 136 mmol/L (ref 135–145)

## 2023-07-03 LAB — CBC
HCT: 41.2 % (ref 39.0–52.0)
Hemoglobin: 12.7 g/dL — ABNORMAL LOW (ref 13.0–17.0)
MCH: 26.2 pg (ref 26.0–34.0)
MCHC: 30.8 g/dL (ref 30.0–36.0)
MCV: 84.9 fL (ref 80.0–100.0)
Platelets: 363 10*3/uL (ref 150–400)
RBC: 4.85 MIL/uL (ref 4.22–5.81)
RDW: 14.1 % (ref 11.5–15.5)
WBC: 10.5 10*3/uL (ref 4.0–10.5)
nRBC: 0 % (ref 0.0–0.2)

## 2023-07-03 LAB — HEMOGLOBIN A1C
Hgb A1c MFr Bld: 6.1 % — ABNORMAL HIGH (ref 4.8–5.6)
Mean Plasma Glucose: 128.37 mg/dL

## 2023-07-03 LAB — GLUCOSE, CAPILLARY: Glucose-Capillary: 135 mg/dL — ABNORMAL HIGH (ref 70–99)

## 2023-07-03 LAB — PROTIME-INR
INR: 1.2 (ref 0.8–1.2)
Prothrombin Time: 15.1 s (ref 11.4–15.2)

## 2023-07-03 LAB — APTT: aPTT: 32 s (ref 24–36)

## 2023-07-03 SURGERY — EXCISION LIPOMA
Anesthesia: General

## 2023-07-03 MED ORDER — ONDANSETRON HCL 4 MG/2ML IJ SOLN
INTRAMUSCULAR | Status: AC
  Filled 2023-07-03: qty 2

## 2023-07-03 MED ORDER — VANCOMYCIN HCL 1500 MG/300ML IV SOLN
1500.0000 mg | INTRAVENOUS | Status: DC
  Administered 2023-07-04 – 2023-07-05 (×2): 1500 mg via INTRAVENOUS
  Filled 2023-07-03 (×3): qty 300

## 2023-07-03 MED ORDER — VANCOMYCIN HCL IN DEXTROSE 1-5 GM/200ML-% IV SOLN
1000.0000 mg | Freq: Once | INTRAVENOUS | Status: AC
  Administered 2023-07-03: 1000 mg via INTRAVENOUS
  Filled 2023-07-03: qty 200

## 2023-07-03 MED ORDER — FENTANYL CITRATE (PF) 100 MCG/2ML IJ SOLN
INTRAMUSCULAR | Status: AC
  Filled 2023-07-03: qty 2

## 2023-07-03 MED ORDER — FENTANYL CITRATE PF 50 MCG/ML IJ SOSY
25.0000 ug | PREFILLED_SYRINGE | INTRAMUSCULAR | Status: DC | PRN
  Administered 2023-07-03 (×3): 50 ug via INTRAVENOUS

## 2023-07-03 MED ORDER — LIDOCAINE HCL (CARDIAC) PF 100 MG/5ML IV SOSY
PREFILLED_SYRINGE | INTRAVENOUS | Status: DC | PRN
  Administered 2023-07-03: 100 mg via INTRAVENOUS

## 2023-07-03 MED ORDER — FENTANYL CITRATE (PF) 100 MCG/2ML IJ SOLN
INTRAMUSCULAR | Status: DC | PRN
  Administered 2023-07-03 (×2): 50 ug via INTRAVENOUS

## 2023-07-03 MED ORDER — LACTATED RINGERS IV SOLN: INTRAVENOUS | Status: DC

## 2023-07-03 MED ORDER — MELATONIN 5 MG PO TABS
5.0000 mg | ORAL_TABLET | Freq: Every evening | ORAL | Status: AC | PRN
  Administered 2023-07-04 – 2023-07-05 (×3): 5 mg via ORAL
  Filled 2023-07-03 (×3): qty 1

## 2023-07-03 MED ORDER — HYDROMORPHONE HCL 1 MG/ML IJ SOLN
INTRAMUSCULAR | Status: DC | PRN
Start: 2023-07-03 — End: 2023-07-03
  Administered 2023-07-03: .5 mg via INTRAVENOUS

## 2023-07-03 MED ORDER — METHOCARBAMOL 500 MG PO TABS
500.0000 mg | ORAL_TABLET | Freq: Four times a day (QID) | ORAL | Status: DC | PRN
  Administered 2023-07-03 – 2023-07-06 (×5): 500 mg via ORAL
  Filled 2023-07-03 (×5): qty 1

## 2023-07-03 MED ORDER — BUPIVACAINE-EPINEPHRINE 0.25% -1:200000 IJ SOLN
INTRAMUSCULAR | Status: AC
  Filled 2023-07-03: qty 1

## 2023-07-03 MED ORDER — FENTANYL CITRATE PF 50 MCG/ML IJ SOSY
PREFILLED_SYRINGE | INTRAMUSCULAR | Status: AC
  Filled 2023-07-03: qty 1

## 2023-07-03 MED ORDER — DEXAMETHASONE SODIUM PHOSPHATE 10 MG/ML IJ SOLN
INTRAMUSCULAR | Status: AC
  Filled 2023-07-03: qty 1

## 2023-07-03 MED ORDER — ONDANSETRON HCL 4 MG/2ML IJ SOLN
INTRAMUSCULAR | Status: DC | PRN
  Administered 2023-07-03: 4 mg via INTRAVENOUS

## 2023-07-03 MED ORDER — LIDOCAINE HCL (PF) 2 % IJ SOLN
INTRAMUSCULAR | Status: AC
  Filled 2023-07-03: qty 5

## 2023-07-03 MED ORDER — PROPOFOL 10 MG/ML IV BOLUS
INTRAVENOUS | Status: AC
  Filled 2023-07-03: qty 20

## 2023-07-03 MED ORDER — TRAMADOL HCL 50 MG PO TABS
50.0000 mg | ORAL_TABLET | Freq: Four times a day (QID) | ORAL | Status: DC | PRN
  Administered 2023-07-03 – 2023-07-06 (×5): 100 mg via ORAL
  Filled 2023-07-03 (×5): qty 2

## 2023-07-03 MED ORDER — HYDROMORPHONE HCL 2 MG/ML IJ SOLN
INTRAMUSCULAR | Status: AC
  Filled 2023-07-03: qty 1

## 2023-07-03 MED ORDER — MIDAZOLAM HCL 2 MG/2ML IJ SOLN
INTRAMUSCULAR | Status: AC
  Filled 2023-07-03: qty 2

## 2023-07-03 MED ORDER — BUPIVACAINE-EPINEPHRINE 0.25% -1:200000 IJ SOLN
INTRAMUSCULAR | Status: DC | PRN
  Administered 2023-07-03: 30 mL

## 2023-07-03 MED ORDER — PROPOFOL 10 MG/ML IV BOLUS
INTRAVENOUS | Status: DC | PRN
  Administered 2023-07-03: 200 mg via INTRAVENOUS

## 2023-07-03 MED ORDER — DEXAMETHASONE SODIUM PHOSPHATE 4 MG/ML IJ SOLN
INTRAMUSCULAR | Status: DC | PRN
  Administered 2023-07-03: 5 mg via INTRAVENOUS

## 2023-07-03 SURGICAL SUPPLY — 25 items
BAG COUNTER SPONGE SURGICOUNT (BAG) IMPLANT
COVER SURGICAL LIGHT HANDLE (MISCELLANEOUS) ×2 IMPLANT
DERMABOND ADVANCED .7 DNX12 (GAUZE/BANDAGES/DRESSINGS) IMPLANT
DRAPE LAPAROSCOPIC ABDOMINAL (DRAPES) IMPLANT
DRAPE LAPAROTOMY TRNSV 102X78 (DRAPES) IMPLANT
DRAPE UTILITY XL STRL (DRAPES) ×2 IMPLANT
ELECT REM PT RETURN 15FT ADLT (MISCELLANEOUS) ×2 IMPLANT
GAUZE PAD ABD 8X10 STRL (GAUZE/BANDAGES/DRESSINGS) IMPLANT
GAUZE SPONGE 4X4 12PLY STRL (GAUZE/BANDAGES/DRESSINGS) ×2 IMPLANT
GAUZE STRETCH 2X75IN STRL (MISCELLANEOUS) IMPLANT
GLOVE BIO SURGEON STRL SZ 6 (GLOVE) ×2 IMPLANT
GLOVE INDICATOR 6.5 STRL GRN (GLOVE) ×2 IMPLANT
GOWN STRL REUS W/ TWL LRG LVL3 (GOWN DISPOSABLE) ×2 IMPLANT
KIT BASIN OR (CUSTOM PROCEDURE TRAY) ×2 IMPLANT
KIT TURNOVER KIT A (KITS) IMPLANT
MARKER SKIN DUAL TIP RULER LAB (MISCELLANEOUS) IMPLANT
NDL HYPO 22X1.5 SAFETY MO (MISCELLANEOUS) ×2 IMPLANT
NDL SAFETY ECLIPSE 18X1.5 (NEEDLE) IMPLANT
NEEDLE HYPO 22X1.5 SAFETY MO (MISCELLANEOUS) ×1 IMPLANT
PACK GENERAL/GYN (CUSTOM PROCEDURE TRAY) ×2 IMPLANT
SUT MNCRL AB 4-0 PS2 18 (SUTURE) ×2 IMPLANT
SUT VIC AB 3-0 SH 27XBRD (SUTURE) ×2 IMPLANT
SYR CONTROL 10ML LL (SYRINGE) ×2 IMPLANT
TOWEL OR 17X26 10 PK STRL BLUE (TOWEL DISPOSABLE) ×2 IMPLANT
TOWEL OR NON WOVEN STRL DISP B (DISPOSABLE) ×2 IMPLANT

## 2023-07-03 NOTE — Progress Notes (Signed)
PROGRESS NOTE    Jonathan Yu  ZOX:[id number] DOB: [birth year] DOA: 07/02/2023 PCP: Everrett Coombe, DO    Chief Complaint  Patient presents with   Abdominal Pain   Bloated    Brief Narrative:  Patient 72 year old gentleman history of gangrenous cholecystitis status post subtotal cholecystectomy 03/2023, hypertension, hyperlipidemia, OSA on CPAP, parkinsonism, type 2 diabetes presented with concern for abscess in the right anterior abdominal wall musculature along with possible enteric fistula.  Patient seen in the ED, CBC done with a mild leukocytosis.  Lipase level normal.  Urinalysis unremarkable.  CT abdomen and pelvis done with a 5.2 x 3.7 cm heterogeneous collection in the right anterior abdominal wall concerning for an abscess along with findings concerning for enteric fistula.  General surgery consulted and patient for probable I&D today 07/03/2023.   Assessment & Plan:   Principal Problem:   Abscess Active Problems:   Essential hypertension, benign   Dyslipidemia   Type 2 diabetes mellitus without complication, without long-term current use of insulin (HCC)   OSA on CPAP   Parkinsonism (HCC)   Acute gangrenous cholecystitis s/p subtotal cholecysectomy 03/14/2023   Enterocutaneous fistula  #1 abscess in right anterior abdominal wall/possible enteric fistula/history of gangrenous cholecystitis status post subtotal cholecystectomy 03/2023 -Patient presented with concern for abscess in the right upper quadrant abdominal region tender to palpation, noted to have a leukocytosis. -CT abdomen and pelvis done concerning for abscess and also possible enteric fistula. -Patient seen in consultation by general surgery who have assessed the patient recommending probable I&D today. -Continue IV Zosyn, IV pain management, supportive care. -Per general surgery.  2.  Hypertension -Continue Norvasc, losartan.  3.  Hyperlipidemia -Statin.  4.  Diabetes mellitus type 2 -Hemoglobin A1c  6.1 (07/02/2023) -CBG 103 this morning. -Continue to hold home dose oral hypoglycemic agents. -Follow.  5.  Parkinsonism/RLS -Continue carbidopa levodopa and Requip.  6.  OSA -CPAP nightly.  7.  BPH -Continue Flomax.  8.  Anxiety -BuSpar.   DVT prophylaxis: Lovenox Code Status: Full Family Communication: Updated patient.  No family at bedside. Disposition: Likely home when clinically improved and cleared by general surgery.  Status is: Inpatient Remains inpatient appropriate because: Severity of illness   Consultants:  General Surgery: Dr. Donell Beers 07/02/2023  Procedures:  CT abdomen and pelvis 07/02/2023   Antimicrobials:  Anti-infectives (From admission, onward)    Start     Dose/Rate Route Frequency Ordered Stop   07/03/23 0300  piperacillin-tazobactam (ZOSYN) IVPB 3.375 g        3.375 g 12.5 mL/hr over 240 Minutes Intravenous Every 8 hours 07/02/23 1900     07/02/23 1800  piperacillin-tazobactam (ZOSYN) IVPB 3.375 g        3.375 g 12.5 mL/hr over 240 Minutes Intravenous  Once 07/02/23 1745 07/02/23 2224   07/02/23 1745  piperacillin-tazobactam (ZOSYN) IVPB 4.5 g  Status:  Discontinued        4.5 g 200 mL/hr over 30 Minutes Intravenous  Once 07/02/23 1742 07/02/23 1745         Subjective: Patient laying in bed.  Still with complaints of right upper quadrant pain around lump/abscess.  States IV Dilaudid helping with pain management at this time.  Denies any chest pain, no shortness of breath.  No nausea or vomiting.  States she saw the general surgeon and patient for I&D this morning.  Objective: Vitals:   07/03/23 0135 07/03/23 0337 07/03/23 0633 07/03/23 0853  BP: 113/62  136/75 (!) 143/75  Pulse: 73  80 80  Resp: 18  18 17   Temp: 98.4 F (36.9 C)  98.6 F (37 C) 98.6 F (37 C)  TempSrc: Oral  Oral Oral  SpO2: 93%  96% 93%  Weight:  104.9 kg    Height:        Intake/Output Summary (Last 24 hours) at 07/03/2023 1125 Last data filed at 07/03/2023  1039 Gross per 24 hour  Intake 1229.94 ml  Output 800 ml  Net 429.94 ml   Filed Weights   07/02/23 1442 07/03/23 0337  Weight: 104.3 kg 104.9 kg    Examination:  General exam: Appears calm and comfortable  Respiratory system: Clear to auscultation.  No wheezes, no crackles, no rhonchi.  Fair air movement.  Speaking in full sentences.  Respiratory effort normal. Cardiovascular system: S1 & S2 heard, RRR. No JVD, murmurs, rubs, gallops or clicks. No pedal edema. Gastrointestinal system: Abdomen is nondistended, soft and right upper quadrant with induration/fluctuance with tenderness to palpation.  Positive bowel sounds.  No rebound.  No guarding.  Central nervous system: Alert and oriented. No focal neurological deficits. Extremities: Symmetric 5 x 5 power. Skin: No rashes, lesions or ulcers Psychiatry: Judgement and insight appear normal. Mood & affect appropriate.     Data Reviewed: I have personally reviewed following labs and imaging studies  CBC: Recent Labs  Lab 07/02/23 1550 07/03/23 0446  WBC 10.7* 10.5  NEUTROABS 8.8*  --   HGB 13.6 12.7*  HCT 44.3 41.2  MCV 84.9 84.9  PLT 381 363    Basic Metabolic Panel: Recent Labs  Lab 07/02/23 1550 07/03/23 0446  NA 137 136  K 4.4 4.3  CL 102 99  CO2 26 26  GLUCOSE 97 92  BUN 16 13  CREATININE 0.97 0.96  CALCIUM 9.4 9.0    GFR: Estimated Creatinine Clearance: 79.9 mL/min (by C-G formula based on SCr of 0.96 mg/dL).  Liver Function Tests: Recent Labs  Lab 07/02/23 1550  AST 14*  ALT 5  ALKPHOS 54  BILITOT 1.3*  PROT 7.7  ALBUMIN 3.2*    CBG: Recent Labs  Lab 07/02/23 2049  GLUCAP 76     No results found for this or any previous visit (from the past 240 hour(s)).       Radiology Studies: CT ABDOMEN PELVIS W CONTRAST  Result Date: 07/02/2023 CLINICAL DATA:  Abdominal pain, postop, right-sided pain. Lump in right abdomen. Patient post subtotal cholecystectomy in August. EXAM: CT ABDOMEN  AND PELVIS WITH CONTRAST TECHNIQUE: Multidetector CT imaging of the abdomen and pelvis was performed using the standard protocol following bolus administration of intravenous contrast. RADIATION DOSE REDUCTION: This exam was performed according to the departmental dose-optimization program which includes automated exposure control, adjustment of the mA and/or kV according to patient size and/or use of iterative reconstruction technique. CONTRAST:  OMNIPAQUE IOHEXOL 300 MG/ML  SOLN COMPARISON:  CT 05/21/2023, CT 03/17/2023 FINDINGS: Lower chest: The heart is upper normal in size. No acute airspace disease. Hepatobiliary: Patient is post subtotal cholecystectomy. Decreased stone burden in the gallbladder fossa with a residual calcified stone. Heterogeneous soft tissue density in the gallbladder fossa with foci of gas, similar in size from prior although the gas is increasing. This is contiguous with a soft tissue tract at site of prior drain extending anteriorly in the right abdomen extending subjacent and within the abdominal wall musculature. Overall increase in size and complexity from prior exam. This is contiguous with a heterogeneous collection in the right anterior abdominal  wall musculature measuring 5.2 x 3.7 cm series 2, image 35, new from prior exam. No common bile duct dilatation or choledocholithiasis. No intrahepatic biliary ductal dilatation. No evidence of focal hepatic lesion. Hepatic steatosis again seen. Pancreas: No ductal dilatation or inflammation. Spleen: Normal in size without focal abnormality. Adrenals/Urinary Tract: No adrenal nodule. Punctate stone in the left kidney. No hydronephrosis or renal inflammation. Decompressed ureters. Partially distended urinary bladder. Mild bladder wall thickening which may be chronic. Stomach/Bowel: There is loss of fat plane between the distal stomach/proximal duodenum and the gallbladder fossa, series 2, image 24. There is also loss of fat plane  between the tract in the hepatic flexure of the colon, coronal series 7, image 47. There is no bowel obstruction. Small to moderate volume of stool in the colon. The appendix is normal. Vascular/Lymphatic: Aortic atherosclerosis. No aortic aneurysm. Prominent portal caval node measuring 9 mm series 2, image 28. Reproductive: Enlarged prostate gland causing mass effect on the bladder base. Other: Tract in the right abdomen at site of prior drain as described above. Tract is increased in size with surrounding inflammation and internal foci of gas. New fluid collection in the right anterior abdominal wall musculature as described. No frank free fluid or ascites. Musculoskeletal: Chronic right L5 pars defect. Again seen degenerative change in the spine. No new osseous findings. IMPRESSION: 1. Patient is post subtotal cholecystectomy. Heterogeneous soft tissue density in the gallbladder fossa with foci of gas, similar in size from prior although the internal gas is increasing. This is contiguous with a soft tissue tract at site of prior drain extending anteriorly in the right abdomen extending subjacent and within the abdominal wall musculature. Overall increase in size and complexity from prior exam. This is contiguous with a heterogeneous collection in the right anterior abdominal wall musculature measuring 5.2 x 3.7 cm, new from prior exam. Findings are concerning for abscess. 2. Loss of fat plane between the distal stomach/proximal duodenum and the gallbladder fossa. There is also loss of fat plane between the drain tract and the hepatic flexure of the colon. Findings raise concern for enteric fistula. Recommend surgical follow-up. 3. Decreased stone burden in the gallbladder fossa with a residual calcified stone. 4. Hepatic steatosis. 5. Punctate left renal stone. 6. Enlarged prostate gland causing mass effect on the bladder base. Mild bladder wall thickening which may be chronic. Aortic Atherosclerosis  (ICD10-I70.0). These results were called by telephone at the time of interpretation on 07/02/2023 at 5:42 pm to provider Alvino Blood , who verbally acknowledged these results. Electronically Signed   By: Narda Rutherford M.D.   On: 07/02/2023 17:42        Scheduled Meds:  acetaminophen  650 mg Oral Q6H   amLODipine  5 mg Oral Daily   atorvastatin  40 mg Oral Daily   busPIRone  10 mg Oral TID   Carbidopa-Levodopa ER  2 tablet Oral TID   enoxaparin (LOVENOX) injection  40 mg Subcutaneous Q24H   losartan  50 mg Oral Q breakfast   polyethylene glycol  17 g Oral Daily   rOPINIRole  2 mg Oral TID   senna-docusate  1 tablet Oral QHS   tamsulosin  0.4 mg Oral Daily   Continuous Infusions:  piperacillin-tazobactam (ZOSYN)  IV 3.375 g (07/03/23 0336)     LOS: 1 day    Time spent: 35 minutes    Ramiro Harvest, MD [facility]   To contact the attending provider between 7A-7P or the covering provider during  after hours 7P-7A, please log into the web site [email or web address] and access using universal [place] password for that web site. If you do not have the password, please call the hospital operator.  07/03/2023, 11:25 AM

## 2023-07-03 NOTE — Anesthesia Preprocedure Evaluation (Addendum)
Anesthesia Evaluation  Patient identified by MRN, date of birth, ID band Patient awake    Reviewed: Allergy & Precautions, H&P , NPO status , Patient's Chart, lab work & pertinent test results  Airway Mallampati: III  TM Distance: >3 FB Neck ROM: Full    Dental no notable dental hx. (+) Teeth Intact, Dental Advisory Given   Pulmonary asthma , sleep apnea , Current Smoker   Pulmonary exam normal breath sounds clear to auscultation       Cardiovascular hypertension, Pt. on medications  Rhythm:Regular Rate:Normal     Neuro/Psych   Anxiety      Neuromuscular disease    GI/Hepatic Neg liver ROS,GERD  ,,  Endo/Other  diabetes, Type 2, Oral Hypoglycemic Agents  Class 3 obesity  Renal/GU negative Renal ROS  negative genitourinary   Musculoskeletal  (+) Arthritis , Osteoarthritis,    Abdominal   Peds  Hematology negative hematology ROS (+)   Anesthesia Other Findings   Reproductive/Obstetrics negative OB ROS                             Anesthesia Physical Anesthesia Plan  ASA: 3  Anesthesia Plan: General   Post-op Pain Management: Tylenol PO (pre-op)*   Induction: Intravenous  PONV Risk Score and Plan: 2 and Ondansetron and Dexamethasone  Airway Management Planned: Oral ETT and LMA  Additional Equipment:   Intra-op Plan:   Post-operative Plan: Extubation in OR  Informed Consent: I have reviewed the patients History and Physical, chart, labs and discussed the procedure including the risks, benefits and alternatives for the proposed anesthesia with the patient or authorized representative who has indicated his/her understanding and acceptance.     Dental advisory given  Plan Discussed with: CRNA  Anesthesia Plan Comments:        Anesthesia Quick Evaluation

## 2023-07-03 NOTE — Anesthesia Procedure Notes (Signed)
Procedure Name: LMA Insertion Date/Time: 07/03/2023 3:14 PM  Performed by: Nathen May, CRNAPre-anesthesia Checklist: Patient identified, Emergency Drugs available, Suction available and Patient being monitored Patient Re-evaluated:Patient Re-evaluated prior to induction Oxygen Delivery Method: Circle System Utilized Preoxygenation: Pre-oxygenation with 100% oxygen Induction Type: IV induction Ventilation: Mask ventilation without difficulty LMA: LMA inserted LMA Size: 5.0 Number of attempts: 1 Airway Equipment and Method: Bite block Placement Confirmation: positive ETCO2 Tube secured with: Tape Dental Injury: Teeth and Oropharynx as per pre-operative assessment

## 2023-07-03 NOTE — Progress Notes (Signed)
Subjective/Chief Complaint: Feeling okay this morning   Objective: Vital signs in last 24 hours: Temp:  [98 F (36.7 C)-98.9 F (37.2 C)] 98.6 F (37 C) (11/21 0853) Pulse Rate:  [73-93] 80 (11/21 0853) Resp:  [17-18] 17 (11/21 0853) BP: (101-148)/(62-76) 143/75 (11/21 0853) SpO2:  [93 %-99 %] 93 % (11/21 0853) Weight:  [104.3 kg-108.4 kg] 104.9 kg (11/21 0337) Last BM Date : 06/30/23  Intake/Output from previous day: 11/20 0701 - 11/21 0700 In: 1162.3 [P.O.:120; IV Piggyback:1042.3] Out: 550 [Urine:550] Intake/Output this shift: Total I/O In: 0  Out: 250 [Urine:250]  Alert, calm Unlabored respirations Abdomen obese, soft, focal area of induration and tenderness in the right upper quadrant without significant overlying skin change  Lab Results:  Recent Labs    07/02/23 1550 07/03/23 0446  WBC 10.7* 10.5  HGB 13.6 12.7*  HCT 44.3 41.2  PLT 381 363   BMET Recent Labs    07/02/23 1550 07/03/23 0446  NA 137 136  K 4.4 4.3  CL 102 99  CO2 26 26  GLUCOSE 97 92  BUN 16 13  CREATININE 0.97 0.96  CALCIUM 9.4 9.0   PT/INR Recent Labs    07/03/23 0446  LABPROT 15.1  INR 1.2   ABG No results for input(s): "PHART", "HCO3" in the last 72 hours.  Invalid input(s): "PCO2", "PO2"  Studies/Results: CT ABDOMEN PELVIS W CONTRAST  Result Date: 07/02/2023 CLINICAL DATA:  Abdominal pain, postop, right-sided pain. Lump in right abdomen. Patient post subtotal cholecystectomy in August. EXAM: CT ABDOMEN AND PELVIS WITH CONTRAST TECHNIQUE: Multidetector CT imaging of the abdomen and pelvis was performed using the standard protocol following bolus administration of intravenous contrast. RADIATION DOSE REDUCTION: This exam was performed according to the departmental dose-optimization program which includes automated exposure control, adjustment of the mA and/or kV according to patient size and/or use of iterative reconstruction technique. CONTRAST:  OMNIPAQUE  IOHEXOL 300 MG/ML  SOLN COMPARISON:  CT 05/21/2023, CT 03/17/2023 FINDINGS: Lower chest: The heart is upper normal in size. No acute airspace disease. Hepatobiliary: Patient is post subtotal cholecystectomy. Decreased stone burden in the gallbladder fossa with a residual calcified stone. Heterogeneous soft tissue density in the gallbladder fossa with foci of gas, similar in size from prior although the gas is increasing. This is contiguous with a soft tissue tract at site of prior drain extending anteriorly in the right abdomen extending subjacent and within the abdominal wall musculature. Overall increase in size and complexity from prior exam. This is contiguous with a heterogeneous collection in the right anterior abdominal wall musculature measuring 5.2 x 3.7 cm series 2, image 35, new from prior exam. No common bile duct dilatation or choledocholithiasis. No intrahepatic biliary ductal dilatation. No evidence of focal hepatic lesion. Hepatic steatosis again seen. Pancreas: No ductal dilatation or inflammation. Spleen: Normal in size without focal abnormality. Adrenals/Urinary Tract: No adrenal nodule. Punctate stone in the left kidney. No hydronephrosis or renal inflammation. Decompressed ureters. Partially distended urinary bladder. Mild bladder wall thickening which may be chronic. Stomach/Bowel: There is loss of fat plane between the distal stomach/proximal duodenum and the gallbladder fossa, series 2, image 24. There is also loss of fat plane between the tract in the hepatic flexure of the colon, coronal series 7, image 47. There is no bowel obstruction. Small to moderate volume of stool in the colon. The appendix is normal. Vascular/Lymphatic: Aortic atherosclerosis. No aortic aneurysm. Prominent portal caval node measuring 9 mm series 2, image 28. Reproductive:  Enlarged prostate gland causing mass effect on the bladder base. Other: Tract in the right abdomen at site of prior drain as described above.  Tract is increased in size with surrounding inflammation and internal foci of gas. New fluid collection in the right anterior abdominal wall musculature as described. No frank free fluid or ascites. Musculoskeletal: Chronic right L5 pars defect. Again seen degenerative change in the spine. No new osseous findings. IMPRESSION: 1. Patient is post subtotal cholecystectomy. Heterogeneous soft tissue density in the gallbladder fossa with foci of gas, similar in size from prior although the internal gas is increasing. This is contiguous with a soft tissue tract at site of prior drain extending anteriorly in the right abdomen extending subjacent and within the abdominal wall musculature. Overall increase in size and complexity from prior exam. This is contiguous with a heterogeneous collection in the right anterior abdominal wall musculature measuring 5.2 x 3.7 cm, new from prior exam. Findings are concerning for abscess. 2. Loss of fat plane between the distal stomach/proximal duodenum and the gallbladder fossa. There is also loss of fat plane between the drain tract and the hepatic flexure of the colon. Findings raise concern for enteric fistula. Recommend surgical follow-up. 3. Decreased stone burden in the gallbladder fossa with a residual calcified stone. 4. Hepatic steatosis. 5. Punctate left renal stone. 6. Enlarged prostate gland causing mass effect on the bladder base. Mild bladder wall thickening which may be chronic. Aortic Atherosclerosis (ICD10-I70.0). These results were called by telephone at the time of interpretation on 07/02/2023 at 5:42 pm to provider Alvino Blood , who verbally acknowledged these results. Electronically Signed   By: Narda Rutherford M.D.   On: 07/02/2023 17:42    Anti-infectives: Anti-infectives (From admission, onward)    Start     Dose/Rate Route Frequency Ordered Stop   07/03/23 0300  piperacillin-tazobactam (ZOSYN) IVPB 3.375 g        3.375 g 12.5 mL/hr over 240 Minutes  Intravenous Every 8 hours 07/02/23 1900     07/02/23 1800  piperacillin-tazobactam (ZOSYN) IVPB 3.375 g        3.375 g 12.5 mL/hr over 240 Minutes Intravenous  Once 07/02/23 1745 07/02/23 2224   07/02/23 1745  piperacillin-tazobactam (ZOSYN) IVPB 4.5 g  Status:  Discontinued        4.5 g 200 mL/hr over 30 Minutes Intravenous  Once 07/02/23 1742 07/02/23 1745       Assessment/Plan:  72 year old man who is now 3 and half months out from a subtotal cholecystectomy complicated by expected bile leak which is resolved.  Did have retained stones in the gallbladder fossa and inflammatory/infectious process which seems to be tracking along prior drain tract towards the skin in the right upper quadrant.   Will plan I&D of the abdominal wall abscess today and we will plan further workup pending intraoperative findings.  I discussed this with the patient.  Questions were welcomed and answered to satisfaction.  He wishes to proceed.    LOS: 1 day    Berna Bue 07/03/2023

## 2023-07-03 NOTE — Progress Notes (Signed)
Pharmacy Antibiotic Note  Jonathan Yu is a 72 y.o. male admitted on 07/02/2023 with  abdominal wall abscess .  Pharmacy has been consulted for vanc dosing to add to Zosyn patient already getting   Plan: Vancomycin 2g IV x 1 then 1500mg  IV q24 - goal AUC 400-550  Height: 5\' 5"  (165.1 cm) Weight: 104.9 kg (231 lb 4.2 oz) IBW/kg (Calculated) : 61.5  Temp (24hrs), Avg:98.2 F (36.8 C), Min:97.5 F (36.4 C), Max:98.6 F (37 C)  Recent Labs  Lab 07/02/23 1550 07/03/23 0446  WBC 10.7* 10.5  CREATININE 0.97 0.96    Estimated Creatinine Clearance: 79.9 mL/min (by C-G formula based on SCr of 0.96 mg/dL).    Allergies  Allergen Reactions   Sesame Oil Hives and Shortness Of Breath   Clonazepam Hives and Other (See Comments)    Tolerates Ativan   Tape Other (See Comments)    White surgical tape caused some blisters and skin was "on fire for 6 months"   Percocet [Oxycodone-Acetaminophen] Rash   Topamax [Topiramate] Other (See Comments)    Cognitive decline      Thank you for allowing pharmacy to be a part of this patient's care.  Berkley Harvey 07/03/2023 6:14 PM

## 2023-07-03 NOTE — Progress Notes (Signed)
   07/03/23 1003  TOC Brief Assessment  Insurance and Status Reviewed  Patient has primary care physician Yes  Home environment has been reviewed yes  Prior level of function: independent  Prior/Current Home Services No current home services  Social Determinants of Health Reivew SDOH reviewed no interventions necessary  Readmission risk has been reviewed Yes  Transition of care needs no transition of care needs at this time

## 2023-07-03 NOTE — Op Note (Signed)
Operative Note  Jonathan Yu  [id number]  [id number]  07/03/2023   Surgeon: Phylliss Blakes MD FACS   Procedure performed: Incision and debridement of abdominal wall abscess involving subcutaneous tissue, fascia and muscle   Preop diagnosis: abdominal wall abscess Post-op diagnosis/intraop findings: same   Specimens: cultures Retained items: packing  EBL: minimal cc Complications: none   Description of procedure: After confirming informed consent the patient was taken to the operating room and placed supine on operating room table where general LMA anesthesia was initiated, preoperative antibiotics were administered, SCDs applied, and a formal timeout was performed.  The abdomen was prepped and draped in usual sterile fashion.  The area of maximum tenderness had been outlined preoperatively.  This area was investigated with an 18-gauge needle and at a depth of approximately 2 and half centimeters we encountered purulent fluid.  I was able to aspirate about 10 cc of this which was handed off for culture.  We then excised the needle track and a 3 x 2 cm core of skin and subcutaneous tissue.  There was no purulence present in the subcutaneous tissue.  The fascia was divided slightly and followed medially until the pocket of pus was encountered.  This was gently digitally probed and evacuated of all purulent material.  An additional counterincision was made laterally where there was a small amount of fluctuance over the right lateral port site.  No real purulence was present here.  Hemostasis was ensured and the wound.  A field block was performed with quarter percent Marcaine with epinephrine.  The wound was then packed with a saline dampened Kerlix followed by dry dressings.  The patient was then awakened, extubated and taken to PACU in stable condition.    All counts were correct at the completion of the case.

## 2023-07-03 NOTE — Plan of Care (Signed)

## 2023-07-03 NOTE — Progress Notes (Signed)
   07/03/23 2304  BiPAP/CPAP/SIPAP  BiPAP/CPAP/SIPAP Pt Type Adult  Reason BIPAP/CPAP not in use Non-compliant (pt refusing cpap for the night.)

## 2023-07-03 NOTE — Progress Notes (Signed)
Pt does not wear CPAP.

## 2023-07-03 NOTE — Transfer of Care (Signed)
Immediate Anesthesia Transfer of Care Note  Patient: Jonathan Yu  Procedure(s) Performed: IRRIGATION AND DEBRIDEMENT OF ABDOMINAL WALL MASS  Patient Location: PACU  Anesthesia Type:General  Level of Consciousness: awake, alert , and oriented  Airway & Oxygen Therapy: Patient Spontanous Breathing and Patient connected to nasal cannula oxygen  Post-op Assessment: Report given to RN and Post -op Vital signs reviewed and stable  Post vital signs: Reviewed and stable  Last Vitals:  Vitals Value Taken Time  BP    Temp    Pulse 93 07/03/23 1617  Resp 13 07/03/23 1617  SpO2 92 % 07/03/23 1617  Vitals shown include unfiled device data.  Last Pain:  Vitals:   07/03/23 1326  TempSrc: Oral  PainSc:       Patients Stated Pain Goal: 2 (07/03/23 0750)  Complications: No notable events documented.

## 2023-07-04 ENCOUNTER — Inpatient Hospital Stay (HOSPITAL_COMMUNITY): Payer: Medicare HMO

## 2023-07-04 ENCOUNTER — Encounter (HOSPITAL_COMMUNITY): Payer: Self-pay | Admitting: Surgery

## 2023-07-04 DIAGNOSIS — I10 Essential (primary) hypertension: Secondary | ICD-10-CM | POA: Diagnosis not present

## 2023-07-04 DIAGNOSIS — L0291 Cutaneous abscess, unspecified: Secondary | ICD-10-CM | POA: Diagnosis not present

## 2023-07-04 DIAGNOSIS — L02211 Cutaneous abscess of abdominal wall: Secondary | ICD-10-CM | POA: Diagnosis not present

## 2023-07-04 DIAGNOSIS — G4733 Obstructive sleep apnea (adult) (pediatric): Secondary | ICD-10-CM | POA: Diagnosis not present

## 2023-07-04 DIAGNOSIS — E785 Hyperlipidemia, unspecified: Secondary | ICD-10-CM | POA: Diagnosis not present

## 2023-07-04 LAB — CBC WITH DIFFERENTIAL/PLATELET
Abs Immature Granulocytes: 0.06 10*3/uL (ref 0.00–0.07)
Basophils Absolute: 0 10*3/uL (ref 0.0–0.1)
Basophils Relative: 0 %
Eosinophils Absolute: 0 10*3/uL (ref 0.0–0.5)
Eosinophils Relative: 0 %
HCT: 41.1 % (ref 39.0–52.0)
Hemoglobin: 13 g/dL (ref 13.0–17.0)
Immature Granulocytes: 1 %
Lymphocytes Relative: 8 %
Lymphs Abs: 0.7 10*3/uL (ref 0.7–4.0)
MCH: 26.6 pg (ref 26.0–34.0)
MCHC: 31.6 g/dL (ref 30.0–36.0)
MCV: 84.2 fL (ref 80.0–100.0)
Monocytes Absolute: 0.4 10*3/uL (ref 0.1–1.0)
Monocytes Relative: 5 %
Neutro Abs: 7.3 10*3/uL (ref 1.7–7.7)
Neutrophils Relative %: 86 %
Platelets: 337 10*3/uL (ref 150–400)
RBC: 4.88 MIL/uL (ref 4.22–5.81)
RDW: 13.8 % (ref 11.5–15.5)
WBC: 8.5 10*3/uL (ref 4.0–10.5)
nRBC: 0 % (ref 0.0–0.2)

## 2023-07-04 LAB — POTASSIUM: Potassium: 4.8 mmol/L (ref 3.5–5.1)

## 2023-07-04 LAB — BASIC METABOLIC PANEL
Anion gap: 7 (ref 5–15)
BUN: 15 mg/dL (ref 8–23)
CO2: 30 mmol/L (ref 22–32)
Calcium: 9.2 mg/dL (ref 8.9–10.3)
Chloride: 100 mmol/L (ref 98–111)
Creatinine, Ser: 1.01 mg/dL (ref 0.61–1.24)
GFR, Estimated: >60 mL/min (ref >60)
Glucose, Bld: 175 mg/dL — ABNORMAL HIGH (ref 70–99)
Potassium: 5.3 mmol/L — ABNORMAL HIGH (ref 3.5–5.1)
Sodium: 137 mmol/L (ref 135–145)

## 2023-07-04 MED ORDER — TECHNETIUM TC 99M MEBROFENIN IV KIT
4.8000 | PACK | Freq: Once | INTRAVENOUS | Status: AC
  Administered 2023-07-04: 4.8 via INTRAVENOUS

## 2023-07-04 MED ORDER — SIMETHICONE 80 MG PO CHEW
160.0000 mg | CHEWABLE_TABLET | Freq: Four times a day (QID) | ORAL | Status: DC
  Administered 2023-07-04 – 2023-07-07 (×12): 160 mg via ORAL
  Filled 2023-07-04 (×12): qty 2

## 2023-07-04 NOTE — Progress Notes (Signed)
HIDA negative for bile leak.    Dressing changed at bedside.  Wound is 100% clean and bleeding with dressing change.  Wound was repacked.  Will order HH if available for dressing care at home, but reiterated numerous times that he would need to have a family or friend available to help with at least daily dressing changes at home as well.  He is going to talk to his daughter who is a CMA to see if she can help.  Letha Cape 1:07 PM 07/04/2023

## 2023-07-04 NOTE — Progress Notes (Signed)
1 Day Post-Op   Subjective/Chief Complaint: Feels a lot better today   Objective: Vital signs in last 24 hours: Temp:  [97.5 F (36.4 C)-98.7 F (37.1 C)] 97.6 F (36.4 C) (11/22 0917) Pulse Rate:  [58-100] 76 (11/22 0917) Resp:  [13-23] 17 (11/22 0917) BP: (103-159)/(50-94) 130/77 (11/22 0917) SpO2:  [89 %-96 %] 92 % (11/22 0917) Weight:  [104.9 kg-105.8 kg] 105.8 kg (11/22 0602) Last BM Date : 06/30/23  Intake/Output from previous day: 11/21 0701 - 11/22 0700 In: 1307.6 [P.O.:600; I.V.:600; IV Piggyback:107.6] Out: 1210 [Urine:1200; Blood:10] Intake/Output this shift: Total I/O In: 300 [P.O.:300] Out: 150 [Urine:150]  Alert well-appearing Unlabored respirations Abdomen soft, improved tenderness and right upper quadrant.  OR dressing with some staining centrally.  Lab Results:  Recent Labs    07/03/23 0446 07/04/23 0506  WBC 10.5 8.5  HGB 12.7* 13.0  HCT 41.2 41.1  PLT 363 337   BMET Recent Labs    07/03/23 0446 07/04/23 0506  NA 136 137  K 4.3 5.3*  CL 99 100  CO2 26 30  GLUCOSE 92 175*  BUN 13 15  CREATININE 0.96 1.01  CALCIUM 9.0 9.2   PT/INR Recent Labs    07/03/23 0446  LABPROT 15.1  INR 1.2   ABG No results for input(s): "PHART", "HCO3" in the last 72 hours.  Invalid input(s): "PCO2", "PO2"  Studies/Results: CT ABDOMEN PELVIS W CONTRAST  Result Date: 07/02/2023 CLINICAL DATA:  Abdominal pain, postop, right-sided pain. Lump in right abdomen. Patient post subtotal cholecystectomy in August. EXAM: CT ABDOMEN AND PELVIS WITH CONTRAST TECHNIQUE: Multidetector CT imaging of the abdomen and pelvis was performed using the standard protocol following bolus administration of intravenous contrast. RADIATION DOSE REDUCTION: This exam was performed according to the departmental dose-optimization program which includes automated exposure control, adjustment of the mA and/or kV according to patient size and/or use of iterative reconstruction technique.  CONTRAST:  OMNIPAQUE IOHEXOL 300 MG/ML  SOLN COMPARISON:  CT 05/21/2023, CT 03/17/2023 FINDINGS: Lower chest: The heart is upper normal in size. No acute airspace disease. Hepatobiliary: Patient is post subtotal cholecystectomy. Decreased stone burden in the gallbladder fossa with a residual calcified stone. Heterogeneous soft tissue density in the gallbladder fossa with foci of gas, similar in size from prior although the gas is increasing. This is contiguous with a soft tissue tract at site of prior drain extending anteriorly in the right abdomen extending subjacent and within the abdominal wall musculature. Overall increase in size and complexity from prior exam. This is contiguous with a heterogeneous collection in the right anterior abdominal wall musculature measuring 5.2 x 3.7 cm series 2, image 35, new from prior exam. No common bile duct dilatation or choledocholithiasis. No intrahepatic biliary ductal dilatation. No evidence of focal hepatic lesion. Hepatic steatosis again seen. Pancreas: No ductal dilatation or inflammation. Spleen: Normal in size without focal abnormality. Adrenals/Urinary Tract: No adrenal nodule. Punctate stone in the left kidney. No hydronephrosis or renal inflammation. Decompressed ureters. Partially distended urinary bladder. Mild bladder wall thickening which may be chronic. Stomach/Bowel: There is loss of fat plane between the distal stomach/proximal duodenum and the gallbladder fossa, series 2, image 24. There is also loss of fat plane between the tract in the hepatic flexure of the colon, coronal series 7, image 47. There is no bowel obstruction. Small to moderate volume of stool in the colon. The appendix is normal. Vascular/Lymphatic: Aortic atherosclerosis. No aortic aneurysm. Prominent portal caval node measuring 9 mm series 2,  image 28. Reproductive: Enlarged prostate gland causing mass effect on the bladder base. Other: Tract in the right abdomen at site of prior  drain as described above. Tract is increased in size with surrounding inflammation and internal foci of gas. New fluid collection in the right anterior abdominal wall musculature as described. No frank free fluid or ascites. Musculoskeletal: Chronic right L5 pars defect. Again seen degenerative change in the spine. No new osseous findings. IMPRESSION: 1. Patient is post subtotal cholecystectomy. Heterogeneous soft tissue density in the gallbladder fossa with foci of gas, similar in size from prior although the internal gas is increasing. This is contiguous with a soft tissue tract at site of prior drain extending anteriorly in the right abdomen extending subjacent and within the abdominal wall musculature. Overall increase in size and complexity from prior exam. This is contiguous with a heterogeneous collection in the right anterior abdominal wall musculature measuring 5.2 x 3.7 cm, new from prior exam. Findings are concerning for abscess. 2. Loss of fat plane between the distal stomach/proximal duodenum and the gallbladder fossa. There is also loss of fat plane between the drain tract and the hepatic flexure of the colon. Findings raise concern for enteric fistula. Recommend surgical follow-up. 3. Decreased stone burden in the gallbladder fossa with a residual calcified stone. 4. Hepatic steatosis. 5. Punctate left renal stone. 6. Enlarged prostate gland causing mass effect on the bladder base. Mild bladder wall thickening which may be chronic. Aortic Atherosclerosis (ICD10-I70.0). These results were called by telephone at the time of interpretation on 07/02/2023 at 5:42 pm to provider Alvino Blood , who verbally acknowledged these results. Electronically Signed   By: Narda Rutherford M.D.   On: 07/02/2023 17:42    Anti-infectives: Anti-infectives (From admission, onward)    Start     Dose/Rate Route Frequency Ordered Stop   07/04/23 2000  vancomycin (VANCOREADY) IVPB 1500 mg/300 mL        1,500 mg 150  mL/hr over 120 Minutes Intravenous Every 24 hours 07/03/23 1817     07/03/23 2100  vancomycin (VANCOCIN) IVPB 1000 mg/200 mL premix       Placed in "Followed by" Linked Group   1,000 mg 200 mL/hr over 60 Minutes Intravenous  Once 07/03/23 1817 07/04/23 0736   07/03/23 2000  vancomycin (VANCOCIN) IVPB 1000 mg/200 mL premix       Placed in "Followed by" Linked Group   1,000 mg 200 mL/hr over 60 Minutes Intravenous  Once 07/03/23 1817 07/04/23 0736   07/03/23 0300  piperacillin-tazobactam (ZOSYN) IVPB 3.375 g        3.375 g 12.5 mL/hr over 240 Minutes Intravenous Every 8 hours 07/02/23 1900     07/02/23 1800  piperacillin-tazobactam (ZOSYN) IVPB 3.375 g        3.375 g 12.5 mL/hr over 240 Minutes Intravenous  Once 07/02/23 1745 07/03/23 0700   07/02/23 1745  piperacillin-tazobactam (ZOSYN) IVPB 4.5 g  Status:  Discontinued        4.5 g 200 mL/hr over 30 Minutes Intravenous  Once 07/02/23 1742 07/02/23 1745       Assessment/Plan:  Abdominal wall abscess-status post I&D 11/21.  Output was purulent, nonbilious, nonfeculent.  Abscess is intramuscular and below the muscle; cultures pending S/p subtotal cholecystectomy 8/2 with expected post op bile leak.  Bile leak resolved with stenting and stent has subsequently been removed.  Retained stones Question of enteric fistula to skin via former drain tract on CT based on decreased/lack of fat planes.  I think this is unlikely at this point.  We will check a HIDA today to ensure no recurrent bile leak. Plan dressing change this afternoon.   LOS: 2 days    Berna Bue 07/04/2023

## 2023-07-04 NOTE — Progress Notes (Signed)
PROGRESS NOTE    Jonathan Yu  NTI:[id number] DOB: May 16, 1951 DOA: 07/02/2023 PCP: Everrett Coombe, DO    Chief Complaint  Patient presents with   Abdominal Pain   Bloated    Brief Narrative:  Patient 72 year old gentleman history of gangrenous cholecystitis status post subtotal cholecystectomy 03/2023, hypertension, hyperlipidemia, OSA on CPAP, parkinsonism, type 2 diabetes presented with concern for abscess in the right anterior abdominal wall musculature along with possible enteric fistula.  Patient seen in the ED, CBC done with a mild leukocytosis.  Lipase level normal.  Urinalysis unremarkable.  CT abdomen and pelvis done with a 5.2 x 3.7 cm heterogeneous collection in the right anterior abdominal wall concerning for an abscess along with findings concerning for enteric fistula.  General surgery consulted and patient for probable I&D today 07/03/2023.   Assessment & Plan:   Principal Problem:   Abscess Active Problems:   Essential hypertension, benign   Dyslipidemia   Type 2 diabetes mellitus without complication, without long-term current use of insulin (HCC)   OSA on CPAP   Parkinsonism (HCC)   Acute gangrenous cholecystitis s/p subtotal cholecysectomy 03/14/2023   Enterocutaneous fistula  #1 abscess in right anterior abdominal wall/possible enteric fistula/history of gangrenous cholecystitis status post subtotal cholecystectomy 03/2023 -Patient presented with concern for abscess in the right upper quadrant abdominal region tender to palpation, noted to have a leukocytosis. -CT abdomen and pelvis done concerning for abscess and also possible enteric fistula. -Patient seen in consultation by general surgery who have assessed the patient and patient underwent incision and debridement of abdominal wall abscess involving subcutaneous tissue, fascia and muscle with purulence noted and culture sent.   -IV antibiotics broadened with addition of IV vancomycin last night in addition to  IV Zosyn.   -Continue pain management, supportive care.   -ID consulted and patient seen in consultation by Dr. Drue Second who recommended continuation of vancomycin and Zosyn pending culture results.  -General Surgery following and appreciate input and recommendations.  2.  Hypertension -Norvasc, losartan.   3.  Hyperlipidemia -Statin.  4.  Diabetes mellitus type 2 -Hemoglobin A1c 6.1 (07/02/2023) -CBG 135 this morning. -Continue to hold home dose oral hypoglycemic agents. -Follow.  5.  Parkinsonism/RLS -Continue carbidopa levodopa and Requip.  6.  OSA -CPAP nightly.  7.  BPH -Flomax.  8.  Anxiety -Continue BuSpar.  9.  Hyperkalemia -Potassium noted at 5.3 this morning with repeat potassium at 4.8.   DVT prophylaxis: Lovenox Code Status: Full Family Communication: Updated patient.  No family at bedside. Disposition: Likely home when clinically improved and cleared by general surgery.  Status is: Inpatient Remains inpatient appropriate because: Severity of illness   Consultants:  General Surgery: Dr. Donell Beers 07/02/2023 Infectious disease: Dr. Drue Second 07/04/2023  Procedures:  CT abdomen and pelvis 07/02/2023 Incision and debridement of abdominal wall abscess involving subcutaneous tissue, fascia and muscle per general surgery: Dr. Fredricka Bonine 07/03/2023  Antimicrobials:  Anti-infectives (From admission, onward)    Start     Dose/Rate Route Frequency Ordered Stop   07/04/23 2000  vancomycin (VANCOREADY) IVPB 1500 mg/300 mL        1,500 mg 150 mL/hr over 120 Minutes Intravenous Every 24 hours 07/03/23 1817     07/03/23 2100  vancomycin (VANCOCIN) IVPB 1000 mg/200 mL premix       Placed in "Followed by" Linked Group   1,000 mg 200 mL/hr over 60 Minutes Intravenous  Once 07/03/23 1817 07/04/23 0736   07/03/23 2000  vancomycin (VANCOCIN) IVPB 1000 mg/200 mL  premix       Placed in "Followed by" Linked Group   1,000 mg 200 mL/hr over 60 Minutes Intravenous  Once 07/03/23  1817 07/04/23 0736   07/03/23 0300  piperacillin-tazobactam (ZOSYN) IVPB 3.375 g        3.375 g 12.5 mL/hr over 240 Minutes Intravenous Every 8 hours 07/02/23 1900     07/02/23 1800  piperacillin-tazobactam (ZOSYN) IVPB 3.375 g        3.375 g 12.5 mL/hr over 240 Minutes Intravenous  Once 07/02/23 1745 07/03/23 0700   07/02/23 1745  piperacillin-tazobactam (ZOSYN) IVPB 4.5 g  Status:  Discontinued        4.5 g 200 mL/hr over 30 Minutes Intravenous  Once 07/02/23 1742 07/02/23 1745         Subjective: Laying in bed, dressing changes been done by surgical PA at bedside.  Patient states some improvement with right upper quadrant abdominal pain after I&D was done yesterday.  Denies any chest pain or shortness of breath.  Feeling bloated asking for some Gas-X.  Tolerating current diet.  States he just saw the ID doctor.    Objective: Vitals:   07/04/23 0212 07/04/23 0602 07/04/23 0917 07/04/23 1249  BP: 117/62 129/75 130/77 138/80  Pulse: (!) 58 63 76 72  Resp: 18 18 17 17   Temp: 98.2 F (36.8 C) (!) 97.5 F (36.4 C) 97.6 F (36.4 C) 97.6 F (36.4 C)  TempSrc:  Oral Oral Oral  SpO2: 95% 91% 92% 94%  Weight:  105.8 kg    Height:        Intake/Output Summary (Last 24 hours) at 07/04/2023 1316 Last data filed at 07/04/2023 1300 Gross per 24 hour  Intake 1804.24 ml  Output 1110 ml  Net 694.24 ml   Filed Weights   07/03/23 0337 07/03/23 1311 07/04/23 0602  Weight: 104.9 kg 104.9 kg 105.8 kg    Examination:  General exam: NAD Respiratory system: CTAB.  No wheezes, no crackles, no rhonchi.  Fair air movement.  Speaking in full sentences.  Normal respiratory effort.  Cardiovascular system: RRR no murmurs rubs or gallops.  No JVD.  No lower extremity edema.  Gastrointestinal system: Abdomen is nondistended, soft and decreased tenderness to palpation right upper quadrant.  OR dressing in place. Positive bowel sounds.  No rebound.  No guarding.  Central nervous system: Alert and  oriented. No focal neurological deficits. Extremities: Symmetric 5 x 5 power. Skin: No rashes, lesions or ulcers Psychiatry: Judgement and insight appear normal. Mood & affect appropriate.     Data Reviewed: I have personally reviewed following labs and imaging studies  CBC: Recent Labs  Lab 07/02/23 1550 07/03/23 0446 07/04/23 0506  WBC 10.7* 10.5 8.5  NEUTROABS 8.8*  --  7.3  HGB 13.6 12.7* 13.0  HCT 44.3 41.2 41.1  MCV 84.9 84.9 84.2  PLT 381 363 337    Basic Metabolic Panel: Recent Labs  Lab 07/02/23 1550 07/03/23 0446 07/04/23 0506 07/04/23 0859  NA 137 136 137  --   K 4.4 4.3 5.3* 4.8  CL 102 99 100  --   CO2 26 26 30   --   GLUCOSE 97 92 175*  --   BUN 16 13 15   --   CREATININE 0.97 0.96 1.01  --   CALCIUM 9.4 9.0 9.2  --     GFR: Estimated Creatinine Clearance: 76.2 mL/min (by C-G formula based on SCr of 1.01 mg/dL).  Liver Function Tests: Recent Labs  Lab  07/02/23 1550  AST 14*  ALT 5  ALKPHOS 54  BILITOT 1.3*  PROT 7.7  ALBUMIN 3.2*    CBG: Recent Labs  Lab 07/02/23 2049 07/03/23 1705  GLUCAP 76 135*     No results found for this or any previous visit (from the past 240 hour(s)).       Radiology Studies: NM HEPATOBILIARY LEAK (POST-SURGICAL)  Result Date: 07/04/2023 CLINICAL DATA:  Subtotal colectomy 04/05/2023. Elevated bilirubin. Evaluate for biliary leak. EXAM: NUCLEAR MEDICINE HEPATOBILIARY IMAGING TECHNIQUE: Sequential images of the abdomen were obtained out to 60 minutes following intravenous administration of radiopharmaceutical. RADIOPHARMACEUTICALS:  4.8 mCi Tc-60m  Choletec IV COMPARISON:  CT 07/02/2023 FINDINGS: Prompt uptake of radiotracer in the liver. Counts are evident within the common bile duct and small bowel 20 minutes. No evidence of extraluminal excreted radiotracer to suggest bile leak. Reflux into the stomach is noted. IMPRESSION: No evidence of biliary leak. Patent common bile duct. Electronically Signed   By:  Genevive Bi M.D.   On: 07/04/2023 11:17   CT ABDOMEN PELVIS W CONTRAST  Result Date: 07/02/2023 CLINICAL DATA:  Abdominal pain, postop, right-sided pain. Lump in right abdomen. Patient post subtotal cholecystectomy in August. EXAM: CT ABDOMEN AND PELVIS WITH CONTRAST TECHNIQUE: Multidetector CT imaging of the abdomen and pelvis was performed using the standard protocol following bolus administration of intravenous contrast. RADIATION DOSE REDUCTION: This exam was performed according to the departmental dose-optimization program which includes automated exposure control, adjustment of the mA and/or kV according to patient size and/or use of iterative reconstruction technique. CONTRAST:  OMNIPAQUE IOHEXOL 300 MG/ML  SOLN COMPARISON:  CT 05/21/2023, CT 03/17/2023 FINDINGS: Lower chest: The heart is upper normal in size. No acute airspace disease. Hepatobiliary: Patient is post subtotal cholecystectomy. Decreased stone burden in the gallbladder fossa with a residual calcified stone. Heterogeneous soft tissue density in the gallbladder fossa with foci of gas, similar in size from prior although the gas is increasing. This is contiguous with a soft tissue tract at site of prior drain extending anteriorly in the right abdomen extending subjacent and within the abdominal wall musculature. Overall increase in size and complexity from prior exam. This is contiguous with a heterogeneous collection in the right anterior abdominal wall musculature measuring 5.2 x 3.7 cm series 2, image 35, new from prior exam. No common bile duct dilatation or choledocholithiasis. No intrahepatic biliary ductal dilatation. No evidence of focal hepatic lesion. Hepatic steatosis again seen. Pancreas: No ductal dilatation or inflammation. Spleen: Normal in size without focal abnormality. Adrenals/Urinary Tract: No adrenal nodule. Punctate stone in the left kidney. No hydronephrosis or renal inflammation. Decompressed ureters.  Partially distended urinary bladder. Mild bladder wall thickening which may be chronic. Stomach/Bowel: There is loss of fat plane between the distal stomach/proximal duodenum and the gallbladder fossa, series 2, image 24. There is also loss of fat plane between the tract in the hepatic flexure of the colon, coronal series 7, image 47. There is no bowel obstruction. Small to moderate volume of stool in the colon. The appendix is normal. Vascular/Lymphatic: Aortic atherosclerosis. No aortic aneurysm. Prominent portal caval node measuring 9 mm series 2, image 28. Reproductive: Enlarged prostate gland causing mass effect on the bladder base. Other: Tract in the right abdomen at site of prior drain as described above. Tract is increased in size with surrounding inflammation and internal foci of gas. New fluid collection in the right anterior abdominal wall musculature as described. No frank free fluid or ascites.  Musculoskeletal: Chronic right L5 pars defect. Again seen degenerative change in the spine. No new osseous findings. IMPRESSION: 1. Patient is post subtotal cholecystectomy. Heterogeneous soft tissue density in the gallbladder fossa with foci of gas, similar in size from prior although the internal gas is increasing. This is contiguous with a soft tissue tract at site of prior drain extending anteriorly in the right abdomen extending subjacent and within the abdominal wall musculature. Overall increase in size and complexity from prior exam. This is contiguous with a heterogeneous collection in the right anterior abdominal wall musculature measuring 5.2 x 3.7 cm, new from prior exam. Findings are concerning for abscess. 2. Loss of fat plane between the distal stomach/proximal duodenum and the gallbladder fossa. There is also loss of fat plane between the drain tract and the hepatic flexure of the colon. Findings raise concern for enteric fistula. Recommend surgical follow-up. 3. Decreased stone burden in the  gallbladder fossa with a residual calcified stone. 4. Hepatic steatosis. 5. Punctate left renal stone. 6. Enlarged prostate gland causing mass effect on the bladder base. Mild bladder wall thickening which may be chronic. Aortic Atherosclerosis (ICD10-I70.0). These results were called by telephone at the time of interpretation on 07/02/2023 at 5:42 pm to provider Alvino Blood , who verbally acknowledged these results. Electronically Signed   By: Narda Rutherford M.D.   On: 07/02/2023 17:42        Scheduled Meds:  acetaminophen  650 mg Oral Q6H   amLODipine  5 mg Oral Daily   atorvastatin  40 mg Oral Daily   busPIRone  10 mg Oral TID   Carbidopa-Levodopa ER  2 tablet Oral TID   enoxaparin (LOVENOX) injection  40 mg Subcutaneous Q24H   losartan  50 mg Oral Q breakfast   polyethylene glycol  17 g Oral Daily   rOPINIRole  2 mg Oral TID   senna-docusate  1 tablet Oral QHS   simethicone  160 mg Oral QID   tamsulosin  0.4 mg Oral Daily   Continuous Infusions:  piperacillin-tazobactam (ZOSYN)  IV 3.375 g (07/04/23 0758)   vancomycin       LOS: 2 days    Time spent: 35 minutes    Ramiro Harvest, MD [facility]   To contact the attending provider between 7A-7P or the covering provider during after hours 7P-7A, please log into the web site [email or web address] and access using universal [place] password for that web site. If you do not have the password, please call the hospital operator.  07/04/2023, 1:16 PM

## 2023-07-04 NOTE — Consult Note (Signed)
[facility]  Total days of antibiotics 3   Reason for Consult: abdominal wall abscess   Referring Physician: Janee Morn  Principal Problem:   Abscess Active Problems:   Essential hypertension, benign   Dyslipidemia   Type 2 diabetes mellitus without complication, without long-term current use of insulin (HCC)   OSA on CPAP   Parkinsonism (HCC)   Acute gangrenous cholecystitis s/p subtotal cholecysectomy 03/14/2023   Enterocutaneous fistula    HPI: Jonathan Yu is a 72 y.o. male with hx of parkinson, T2DM, HLD, HTN. Has history of gangrenous cholecystitis s/p laparoscopic subtotal chole in early August. He had acute on chronic gangrenous cholecystitis, with dense fibrotic type tissue over much of the wall of the gallblader. Unable to do full dissection. C/b bile leak, common bile duct stent placement and perc drain. Was at risk for infection/complication since he did have total cholecystectomy.he presented on 11/20 with worsening right sided abdominal pain as well as a swelling to abdominal wall roughly 2 x 3 cm. Opn admit, he had mild leukocytsois, and CT imaging showing 5.2 x 3.7 collection in the right anterior abdominal wall concern for abscess as well as possible enteric fistula. He underwent Incision and debridement of abdominal wall abscess involving subcutaneous tissue, fascia and muscle. He was started on piptazo and vancomycin. Cultures are pending. Gram stain showing gpc, gpr, gnr and gnc.  Past Medical History:  Diagnosis Date   Allergy    Anxiety    Asthma    Cataract    DDD (degenerative disc disease), lumbar    GERD (gastroesophageal reflux disease)    Hyperlipidemia    "no meds since losing 106# 2 yr ago" (10/06/2013)   Hypertension 08/12/2010   "no meds since losing 106# 2 yr ago" (10/06/2013)   Neuromuscular disorder (HCC)    OSA on CPAP    "don't wear mask much since losing 106# 2 yr ago" (10/06/2013)   Parkinson's disease (HCC)     Sleep apnea    Type II diabetes mellitus (HCC)    "no meds since losing 106# 2 yr ago" (10/06/2013)   Walking pneumonia 08/12/1966    Allergies:  Allergies  Allergen Reactions   Sesame Oil Hives and Shortness Of Breath   Clonazepam Hives and Other (See Comments)    Tolerates Ativan   Tape Other (See Comments)    White surgical tape caused some blisters and skin was "on fire for 6 months"   Percocet [Oxycodone-Acetaminophen] Rash   Topamax [Topiramate] Other (See Comments)    Cognitive decline    MEDICATIONS:  acetaminophen  650 mg Oral Q6H   amLODipine  5 mg Oral Daily   atorvastatin  40 mg Oral Daily   busPIRone  10 mg Oral TID   Carbidopa-Levodopa ER  2 tablet Oral TID   enoxaparin (LOVENOX) injection  40 mg Subcutaneous Q24H   losartan  50 mg Oral Q breakfast   polyethylene glycol  17 g Oral Daily   rOPINIRole  2 mg Oral TID   senna-docusate  1 tablet Oral QHS   simethicone  160 mg Oral QID   tamsulosin  0.4 mg Oral Daily    Social History   Tobacco Use   Smoking status: Some Days    Types: Pipe, Cigars   Smokeless tobacco: Never   Tobacco comments:    Just a few cigars per year  Vaping Use   Vaping status: Never Used  Substance Use Topics   Alcohol use:  Not Currently    Comment: 1-2 drinks per year.   Drug use: No    Family History  Problem Relation Age of Onset   Cancer Mother 101       Lung   Alzheimer's disease Father    Parkinson's disease Father    Early death Sister    Scoliosis Sister    Cleft lip Sister     Review of Systems -  +right abdominal wall pain. Tremors from parkinson's. 12 point ros is negative  OBJECTIVE: Temp:  [97.5 F (36.4 C)-98.7 F (37.1 C)] 97.6 F (36.4 C) (11/22 1249) Pulse Rate:  [58-100] 72 (11/22 1249) Resp:  [13-23] 17 (11/22 1249) BP: (103-138)/(50-83) 138/80 (11/22 1249) SpO2:  [89 %-96 %] 94 % (11/22 1249) Weight:  [105.8 kg] 105.8 kg (11/22 0602) Physical Exam  Constitutional: He is oriented to person,  place, and time. He appears well-developed and well-nourished. No distress.  HENT:  Mouth/Throat: Oropharynx is clear and moist. No oropharyngeal exudate.  Cardiovascular: Normal rate, regular rhythm and normal heart sounds. Exam reveals no gallop and no friction rub.  No murmur heard.  Pulmonary/Chest: Effort normal and breath sounds normal. No respiratory distress. He has no wheezes.  Abdominal: Soft. Bowel sounds are normal. He exhibits no distension. There is no tenderness. Right bandaged abdominal wall. No drain Lymphadenopathy:  He has no cervical adenopathy.  Neurological: He is alert and oriented to person, place, and time. +resting tremors Skin: Skin is warm and dry. No rash noted. No erythema.  Psychiatric: He has a normal mood and affect. His behavior is normal.    LABS: Results for orders placed or performed during the hospital encounter of 07/02/23 (from the past 48 hour(s))  Comprehensive metabolic panel     Status: Abnormal   Collection Time: 07/02/23  3:50 PM  Result Value Ref Range   Sodium 137 135 - 145 mmol/L   Potassium 4.4 3.5 - 5.1 mmol/L   Chloride 102 98 - 111 mmol/L   CO2 26 22 - 32 mmol/L   Glucose, Bld 97 70 - 99 mg/dL    Comment: Glucose reference range applies only to samples taken after fasting for at least 8 hours.   BUN 16 8 - 23 mg/dL   Creatinine, Ser 1.60 0.61 - 1.24 mg/dL   Calcium 9.4 8.9 - 73.7 mg/dL   Total Protein 7.7 6.5 - 8.1 g/dL   Albumin 3.2 (L) 3.5 - 5.0 g/dL   AST 14 (L) 15 - 41 U/L   ALT 5 0 - 44 U/L   Alkaline Phosphatase 54 38 - 126 U/L   Total Bilirubin 1.3 (H) <1.2 mg/dL   GFR, Estimated >10 >62 mL/min    Comment: (NOTE) Calculated using the CKD-EPI Creatinine Equation (2021)    Anion gap 9 5 - 15    Comment: Performed at [facility], [address] [place], [place] [address]  CBC with Differential     Status: Abnormal   Collection Time: 07/02/23  3:50 PM  Result Value Ref Range   WBC 10.7 (H) 4.0 -  10.5 K/uL   RBC 5.22 4.22 - 5.81 MIL/uL   Hemoglobin 13.6 13.0 - 17.0 g/dL   HCT 46.2 70.3 - 50.0 %   MCV 84.9 80.0 - 100.0 fL   MCH 26.1 26.0 - 34.0 pg   MCHC 30.7 30.0 - 36.0 g/dL   RDW 93.8 18.2 - 99.3 %   Platelets 381 150 - 400 K/uL   nRBC  0.0 0.0 - 0.2 %   Neutrophils Relative % 82 %   Neutro Abs 8.8 (H) 1.7 - 7.7 K/uL   Lymphocytes Relative 10 %   Lymphs Abs 1.1 0.7 - 4.0 K/uL   Monocytes Relative 7 %   Monocytes Absolute 0.7 0.1 - 1.0 K/uL   Eosinophils Relative 1 %   Eosinophils Absolute 0.1 0.0 - 0.5 K/uL   Basophils Relative 0 %   Basophils Absolute 0.0 0.0 - 0.1 K/uL   Immature Granulocytes 0 %   Abs Immature Granulocytes 0.04 0.00 - 0.07 K/uL    Comment: Performed at [facility], [address], [place], [place] [address]  Lipase, blood     Status: None   Collection Time: 07/02/23  3:50 PM  Result Value Ref Range   Lipase 22 11 - 51 U/L    Comment: Performed at [facility], [address] [place], [place] [address]  Urinalysis, w/ Reflex to Culture (Infection Suspected) -Urine, Clean Catch     Status: Abnormal   Collection Time: 07/02/23  3:51 PM  Result Value Ref Range   Specimen Source URINE, CLEAN CATCH    Color, Urine YELLOW YELLOW   APPearance CLEAR CLEAR   Specific Gravity, Urine 1.020 1.005 - 1.030   pH 6.0 5.0 - 8.0   Glucose, UA NEGATIVE NEGATIVE mg/dL   Hgb urine dipstick NEGATIVE NEGATIVE   Bilirubin Urine NEGATIVE NEGATIVE   Ketones, ur 5 (A) NEGATIVE mg/dL   Protein, ur NEGATIVE NEGATIVE mg/dL   Nitrite NEGATIVE NEGATIVE   Leukocytes,Ua NEGATIVE NEGATIVE   RBC / HPF 0-5 0 - 5 RBC/hpf   WBC, UA 0-5 0 - 5 WBC/hpf    Comment:        Reflex urine culture not performed if WBC <=10, OR if Squamous epithelial cells >5. If Squamous epithelial cells >5 suggest recollection.    Bacteria, UA NONE SEEN NONE SEEN   Squamous Epithelial / HPF 0-5 0 - 5 /HPF   Mucus PRESENT    Hyaline Casts, UA PRESENT      Comment: Performed at [facility], [address] [place], [place] [address]  Glucose, capillary     Status: None   Collection Time: 07/02/23  8:49 PM  Result Value Ref Range   Glucose-Capillary 76 70 - 99 mg/dL    Comment: Glucose reference range applies only to samples taken after fasting for at least 8 hours.  Hemoglobin A1c     Status: Abnormal   Collection Time: 07/02/23  9:18 PM  Result Value Ref Range   Hgb A1c MFr Bld 6.1 (H) 4.8 - 5.6 %    Comment: (NOTE) Pre diabetes:          5.7%-6.4%  Diabetes:              >6.4%  Glycemic control for   <7.0% adults with diabetes    Mean Plasma Glucose 128.37 mg/dL    Comment: Performed at [facility], [address], [place], [place] [address]  CBC     Status: Abnormal   Collection Time: 07/03/23  4:46 AM  Result Value Ref Range   WBC 10.5 4.0 - 10.5 K/uL   RBC 4.85 4.22 - 5.81 MIL/uL   Hemoglobin 12.7 (L) 13.0 - 17.0 g/dL   HCT 29.5 62.1 - 30.8 %   MCV 84.9 80.0 - 100.0 fL   MCH 26.2 26.0 - 34.0 pg   MCHC 30.8 30.0 -  36.0 g/dL   RDW 70.3 50.0 - 93.8 %   Platelets 363 150 - 400 K/uL   nRBC 0.0 0.0 - 0.2 %    Comment: Performed at [facility], [address] [place], [place] [address]  Protime-INR     Status: None   Collection Time: 07/03/23  4:46 AM  Result Value Ref Range   Prothrombin Time 15.1 11.4 - 15.2 seconds   INR 1.2 0.8 - 1.2    Comment: (NOTE) INR goal varies based on device and disease states. Performed at [facility], [address], [place], [place] [address]   APTT     Status: None   Collection Time: 07/03/23  4:46 AM  Result Value Ref Range   aPTT 32 24 - 36 seconds    Comment: Performed at [facility], [address], [place], [place] [address]  Basic metabolic panel     Status: None   Collection Time: 07/03/23  4:46 AM  Result Value Ref Range   Sodium 136 135 - 145 mmol/L   Potassium 4.3 3.5 - 5.1 mmol/L    Chloride 99 98 - 111 mmol/L   CO2 26 22 - 32 mmol/L   Glucose, Bld 92 70 - 99 mg/dL    Comment: Glucose reference range applies only to samples taken after fasting for at least 8 hours.   BUN 13 8 - 23 mg/dL   Creatinine, Ser 8.10 0.61 - 1.24 mg/dL   Calcium 9.0 8.9 - 17.5 mg/dL   GFR, Estimated >10 >25 mL/min    Comment: (NOTE) Calculated using the CKD-EPI Creatinine Equation (2021)    Anion gap 11 5 - 15    Comment: Performed at [facility], [address] [place], [place] [address]  Aerobic/Anaerobic Culture w Gram Stain (surgical/deep wound)     Status: None (Preliminary result)   Collection Time: 07/03/23  3:31 PM   Specimen: Abscess  Result Value Ref Range   Specimen Description      ABSCESS Performed at [facility], [address] [place], [place] [address]    Special Requests      NONE Performed at [facility], [address] [place], [place] [address]    Gram Stain      ABUNDANT WBC PRESENT, PREDOMINANTLY PMN ABUNDANT GRAM POSITIVE COCCI MODERATE GRAM POSITIVE RODS FEW GRAM NEGATIVE RODS RARE GRAM NEGATIVE COCCI Performed at [facility], [address] [place], [place] [address]    Culture PENDING    Report Status PENDING   Glucose, capillary     Status: Abnormal   Collection Time: 07/03/23  5:05 PM  Result Value Ref Range   Glucose-Capillary 135 (H) 70 - 99 mg/dL    Comment: Glucose reference range applies only to samples taken after fasting for at least 8 hours.  CBC with Differential/Platelet     Status: None   Collection Time: 07/04/23  5:06 AM  Result Value Ref Range   WBC 8.5 4.0 - 10.5 K/uL   RBC 4.88 4.22 - 5.81 MIL/uL   Hemoglobin 13.0 13.0 - 17.0 g/dL   HCT 00.8 67.6 - 19.5 %   MCV 84.2 80.0 - 100.0 fL   MCH 26.6 26.0 - 34.0 pg   MCHC 31.6 30.0 - 36.0 g/dL   RDW 09.3 26.7 - 12.4 %   Platelets 337 150 - 400 K/uL   nRBC 0.0 0.0 - 0.2 %   Neutrophils  Relative % 86 %   Neutro  Abs 7.3 1.7 - 7.7 K/uL   Lymphocytes Relative 8 %   Lymphs Abs 0.7 0.7 - 4.0 K/uL   Monocytes Relative 5 %   Monocytes Absolute 0.4 0.1 - 1.0 K/uL   Eosinophils Relative 0 %   Eosinophils Absolute 0.0 0.0 - 0.5 K/uL   Basophils Relative 0 %   Basophils Absolute 0.0 0.0 - 0.1 K/uL   Immature Granulocytes 1 %   Abs Immature Granulocytes 0.06 0.00 - 0.07 K/uL    Comment: Performed at [facility], [address] [place], [place] [address]  Basic metabolic panel     Status: Abnormal   Collection Time: 07/04/23  5:06 AM  Result Value Ref Range   Sodium 137 135 - 145 mmol/L   Potassium 5.3 (H) 3.5 - 5.1 mmol/L   Chloride 100 98 - 111 mmol/L   CO2 30 22 - 32 mmol/L   Glucose, Bld 175 (H) 70 - 99 mg/dL    Comment: Glucose reference range applies only to samples taken after fasting for at least 8 hours.   BUN 15 8 - 23 mg/dL   Creatinine, Ser 6.04 0.61 - 1.24 mg/dL   Calcium 9.2 8.9 - 54.0 mg/dL   GFR, Estimated >98 >11 mL/min    Comment: (NOTE) Calculated using the CKD-EPI Creatinine Equation (2021)    Anion gap 7 5 - 15    Comment: Performed at [facility], [address], [place], [place] [address]  Potassium     Status: None   Collection Time: 07/04/23  8:59 AM  Result Value Ref Range   Potassium 4.8 3.5 - 5.1 mmol/L    Comment: Performed at [facility], [address] [place], [place] [address]    MICRO: pending IMAGING: NM HEPATOBILIARY LEAK (POST-SURGICAL)  Result Date: 07/04/2023 CLINICAL DATA:  Subtotal colectomy 04/05/2023. Elevated bilirubin. Evaluate for biliary leak. EXAM: NUCLEAR MEDICINE HEPATOBILIARY IMAGING TECHNIQUE: Sequential images of the abdomen were obtained out to 60 minutes following intravenous administration of radiopharmaceutical. RADIOPHARMACEUTICALS:  4.8 mCi Tc-57m  Choletec IV COMPARISON:  CT 07/02/2023 FINDINGS: Prompt uptake of radiotracer in the liver. Counts are evident within the common  bile duct and small bowel 20 minutes. No evidence of extraluminal excreted radiotracer to suggest bile leak. Reflux into the stomach is noted. IMPRESSION: No evidence of biliary leak. Patent common bile duct. Electronically Signed   By: Genevive Bi M.D.   On: 07/04/2023 11:17   CT ABDOMEN PELVIS W CONTRAST  Result Date: 07/02/2023 CLINICAL DATA:  Abdominal pain, postop, right-sided pain. Lump in right abdomen. Patient post subtotal cholecystectomy in August. EXAM: CT ABDOMEN AND PELVIS WITH CONTRAST TECHNIQUE: Multidetector CT imaging of the abdomen and pelvis was performed using the standard protocol following bolus administration of intravenous contrast. RADIATION DOSE REDUCTION: This exam was performed according to the departmental dose-optimization program which includes automated exposure control, adjustment of the mA and/or kV according to patient size and/or use of iterative reconstruction technique. CONTRAST:  OMNIPAQUE IOHEXOL 300 MG/ML  SOLN COMPARISON:  CT 05/21/2023, CT 03/17/2023 FINDINGS: Lower chest: The heart is upper normal in size. No acute airspace disease. Hepatobiliary: Patient is post subtotal cholecystectomy. Decreased stone burden in the gallbladder fossa with a residual calcified stone. Heterogeneous soft tissue density in the gallbladder fossa with foci of gas, similar in size from prior although the gas is increasing. This is contiguous with a soft tissue tract at site  of prior drain extending anteriorly in the right abdomen extending subjacent and within the abdominal wall musculature. Overall increase in size and complexity from prior exam. This is contiguous with a heterogeneous collection in the right anterior abdominal wall musculature measuring 5.2 x 3.7 cm series 2, image 35, new from prior exam. No common bile duct dilatation or choledocholithiasis. No intrahepatic biliary ductal dilatation. No evidence of focal hepatic lesion. Hepatic steatosis again seen.  Pancreas: No ductal dilatation or inflammation. Spleen: Normal in size without focal abnormality. Adrenals/Urinary Tract: No adrenal nodule. Punctate stone in the left kidney. No hydronephrosis or renal inflammation. Decompressed ureters. Partially distended urinary bladder. Mild bladder wall thickening which may be chronic. Stomach/Bowel: There is loss of fat plane between the distal stomach/proximal duodenum and the gallbladder fossa, series 2, image 24. There is also loss of fat plane between the tract in the hepatic flexure of the colon, coronal series 7, image 47. There is no bowel obstruction. Small to moderate volume of stool in the colon. The appendix is normal. Vascular/Lymphatic: Aortic atherosclerosis. No aortic aneurysm. Prominent portal caval node measuring 9 mm series 2, image 28. Reproductive: Enlarged prostate gland causing mass effect on the bladder base. Other: Tract in the right abdomen at site of prior drain as described above. Tract is increased in size with surrounding inflammation and internal foci of gas. New fluid collection in the right anterior abdominal wall musculature as described. No frank free fluid or ascites. Musculoskeletal: Chronic right L5 pars defect. Again seen degenerative change in the spine. No new osseous findings. IMPRESSION: 1. Patient is post subtotal cholecystectomy. Heterogeneous soft tissue density in the gallbladder fossa with foci of gas, similar in size from prior although the internal gas is increasing. This is contiguous with a soft tissue tract at site of prior drain extending anteriorly in the right abdomen extending subjacent and within the abdominal wall musculature. Overall increase in size and complexity from prior exam. This is contiguous with a heterogeneous collection in the right anterior abdominal wall musculature measuring 5.2 x 3.7 cm, new from prior exam. Findings are concerning for abscess. 2. Loss of fat plane between the distal stomach/proximal  duodenum and the gallbladder fossa. There is also loss of fat plane between the drain tract and the hepatic flexure of the colon. Findings raise concern for enteric fistula. Recommend surgical follow-up. 3. Decreased stone burden in the gallbladder fossa with a residual calcified stone. 4. Hepatic steatosis. 5. Punctate left renal stone. 6. Enlarged prostate gland causing mass effect on the bladder base. Mild bladder wall thickening which may be chronic. Aortic Atherosclerosis (ICD10-I70.0). These results were called by telephone at the time of interpretation on 07/02/2023 at 5:42 pm to provider Alvino Blood , who verbally acknowledged these results. Electronically Signed   By: Narda Rutherford M.D.   On: 07/02/2023 17:42    HISTORICAL MICRO/IMAGING  Assessment/Plan:  72yo M with hx of partial subtotal chole for gangrenous cholecystectomy in August now present with rigth abdominal wall abscess s/p IX D. Polymicrobial by gram stain. Also may have enteric fistula.  - continue on vancomycin and piptazo for the time being - wil follow cultures to see how we can narrow/find orals when ready for discharge - will see back on Monday - dr Zenaida Niece dam available for questions over the weekend.  Duke Salvia Drue Second MD MPH [facility] [phone]  -

## 2023-07-04 NOTE — Anesthesia Postprocedure Evaluation (Signed)
Anesthesia Post Note  Patient: Jonathan Yu  Procedure(s) Performed: IRRIGATION AND DEBRIDEMENT OF ABDOMINAL WALL MASS     Patient location during evaluation: PACU Anesthesia Type: General Level of consciousness: sedated and patient cooperative Pain management: pain level controlled Vital Signs Assessment: post-procedure vital signs reviewed and stable Respiratory status: spontaneous breathing Cardiovascular status: stable Anesthetic complications: no   No notable events documented.  Last Vitals:  Vitals:   07/04/23 0212 07/04/23 0602  BP: 117/62 129/75  Pulse: (!) 58 63  Resp: 18 18  Temp: 36.8 C (!) 36.4 C  SpO2: 95% 91%    Last Pain:  Vitals:   07/04/23 0826  TempSrc:   PainSc: 3                  Lewie Loron

## 2023-07-05 DIAGNOSIS — G4733 Obstructive sleep apnea (adult) (pediatric): Secondary | ICD-10-CM | POA: Diagnosis not present

## 2023-07-05 DIAGNOSIS — E785 Hyperlipidemia, unspecified: Secondary | ICD-10-CM | POA: Diagnosis not present

## 2023-07-05 DIAGNOSIS — I10 Essential (primary) hypertension: Secondary | ICD-10-CM | POA: Diagnosis not present

## 2023-07-05 DIAGNOSIS — L0291 Cutaneous abscess, unspecified: Secondary | ICD-10-CM | POA: Diagnosis not present

## 2023-07-05 LAB — COMPREHENSIVE METABOLIC PANEL
ALT: <5 U/L (ref 0–44)
AST: 14 U/L — ABNORMAL LOW (ref 15–41)
Albumin: 2.6 g/dL — ABNORMAL LOW (ref 3.5–5.0)
Alkaline Phosphatase: 53 U/L (ref 38–126)
Anion gap: 7 (ref 5–15)
BUN: 19 mg/dL (ref 8–23)
CO2: 29 mmol/L (ref 22–32)
Calcium: 8.8 mg/dL — ABNORMAL LOW (ref 8.9–10.3)
Chloride: 99 mmol/L (ref 98–111)
Creatinine, Ser: 0.94 mg/dL (ref 0.61–1.24)
GFR, Estimated: >60 mL/min (ref >60)
Glucose, Bld: 140 mg/dL — ABNORMAL HIGH (ref 70–99)
Potassium: 4 mmol/L (ref 3.5–5.1)
Sodium: 135 mmol/L (ref 135–145)
Total Bilirubin: 0.6 mg/dL (ref <1.2)
Total Protein: 6.9 g/dL (ref 6.5–8.1)

## 2023-07-05 LAB — CBC
HCT: 41.3 % (ref 39.0–52.0)
Hemoglobin: 12.6 g/dL — ABNORMAL LOW (ref 13.0–17.0)
MCH: 26.5 pg (ref 26.0–34.0)
MCHC: 30.5 g/dL (ref 30.0–36.0)
MCV: 86.8 fL (ref 80.0–100.0)
Platelets: 384 10*3/uL (ref 150–400)
RBC: 4.76 MIL/uL (ref 4.22–5.81)
RDW: 13.9 % (ref 11.5–15.5)
WBC: 10.3 10*3/uL (ref 4.0–10.5)
nRBC: 0 % (ref 0.0–0.2)

## 2023-07-05 LAB — GLUCOSE, CAPILLARY
Glucose-Capillary: 102 mg/dL — ABNORMAL HIGH (ref 70–99)
Glucose-Capillary: 119 mg/dL — ABNORMAL HIGH (ref 70–99)
Glucose-Capillary: 158 mg/dL — ABNORMAL HIGH (ref 70–99)

## 2023-07-05 LAB — MAGNESIUM: Magnesium: 2.2 mg/dL (ref 1.7–2.4)

## 2023-07-05 MED ORDER — SENNOSIDES-DOCUSATE SODIUM 8.6-50 MG PO TABS
1.0000 | ORAL_TABLET | Freq: Two times a day (BID) | ORAL | Status: DC
  Filled 2023-07-05: qty 1

## 2023-07-05 MED ORDER — MAGNESIUM HYDROXIDE 400 MG/5ML PO SUSP
15.0000 mL | Freq: Once | ORAL | Status: AC
Start: 2023-07-05 — End: 2023-07-05
  Administered 2023-07-05: 15 mL via ORAL
  Filled 2023-07-05: qty 30

## 2023-07-05 MED ORDER — POLYETHYLENE GLYCOL 3350 17 G PO PACK: 17.0000 g | PACK | Freq: Two times a day (BID) | ORAL | Status: DC

## 2023-07-05 MED ORDER — SORBITOL 70 % SOLN
30.0000 mL | Freq: Once | Status: AC
  Administered 2023-07-05: 30 mL via ORAL
  Filled 2023-07-05: qty 30

## 2023-07-05 NOTE — Plan of Care (Signed)
  Problem: Education: Goal: Ability to describe self-care measures that may prevent or decrease complications (Diabetes Survival Skills Education) will improve Outcome: Progressing   

## 2023-07-05 NOTE — Plan of Care (Signed)
Problem: Coping: Goal: Ability to adjust to condition or change in health will improve Outcome: Progressing   Problem: Fluid Volume: Goal: Ability to maintain a balanced intake and output will improve Outcome: Progressing   Problem: Nutritional: Goal: Maintenance of adequate nutrition will improve Outcome: Adequate for Discharge

## 2023-07-05 NOTE — Progress Notes (Signed)
PROGRESS NOTE    Jonathan Yu  WUJ:[id number] DOB: 11/13/1950 DOA: 07/02/2023 PCP: Everrett Coombe, DO    Chief Complaint  Patient presents with   Abdominal Pain   Bloated    Brief Narrative:  Patient 72 year old gentleman history of gangrenous cholecystitis status post subtotal cholecystectomy 03/2023, hypertension, hyperlipidemia, OSA on CPAP, parkinsonism, type 2 diabetes presented with concern for abscess in the right anterior abdominal wall musculature along with possible enteric fistula.  Patient seen in the ED, CBC done with a mild leukocytosis.  Lipase level normal.  Urinalysis unremarkable.  CT abdomen and pelvis done with a 5.2 x 3.7 cm heterogeneous collection in the right anterior abdominal wall concerning for an abscess along with findings concerning for enteric fistula.  General surgery consulted and patient for probable I&D today 07/03/2023.   Assessment & Plan:   Principal Problem:   Abscess Active Problems:   Essential hypertension, benign   Dyslipidemia   Type 2 diabetes mellitus without complication, without long-term current use of insulin (HCC)   OSA on CPAP   Parkinsonism (HCC)   Acute gangrenous cholecystitis s/p subtotal cholecysectomy 03/14/2023   Enterocutaneous fistula  #1 abscess in right anterior abdominal wall/possible enteric fistula/history of gangrenous cholecystitis status post subtotal cholecystectomy 03/2023 -Patient presented with concern for abscess in the right upper quadrant abdominal region tender to palpation, noted to have a leukocytosis. -CT abdomen and pelvis done concerning for abscess and also possible enteric fistula. -Patient seen in consultation by general surgery who have assessed the patient and patient underwent incision and debridement of abdominal wall abscess involving subcutaneous tissue, fascia and muscle with purulence noted and culture sent.   -Continue IV antibiotics of vancomycin and Zosyn.    -Continue pain management,  supportive care.   -ID consulted and patient seen in consultation by Dr. Drue Second who recommended continuation of vancomycin and Zosyn pending culture results.  -General Surgery following and appreciate input and recommendations.  2.  Hypertension -Controlled on losartan, Norvasc.    3.  Hyperlipidemia -Statin.  4.  Diabetes mellitus type 2 -Hemoglobin A1c 6.1 (07/02/2023) -CBG 102 this morning. -Continue to hold home dose oral hypoglycemic agents. -Follow.  5.  Parkinsonism/RLS -Continue carbidopa levodopa and Requip.  6.  OSA -CPAP nightly.  7.  BPH -Continue Flomax.  8.  Anxiety -BuSpar.    9.  Hyperkalemia -Resolved.   -Potassium of 4.0.  10.  Constipation -Patient passing flatus. -Patient with no BM. -Increase MiraLAX to twice daily and increase Senokot-S to twice daily. -Sorbitol p.o. x 1.   DVT prophylaxis: Lovenox Code Status: Full Family Communication: Updated patient.  No family at bedside. Disposition: Likely home when clinically improved and cleared by general surgery.  Status is: Inpatient Remains inpatient appropriate because: Severity of illness   Consultants:  General Surgery: Dr. Donell Beers 07/02/2023 Infectious disease: Dr. Drue Second 07/04/2023  Procedures:  CT abdomen and pelvis 07/02/2023 Incision and debridement of abdominal wall abscess involving subcutaneous tissue, fascia and muscle per general surgery: Dr. Fredricka Bonine 07/03/2023  Antimicrobials:  Anti-infectives (From admission, onward)    Start     Dose/Rate Route Frequency Ordered Stop   07/04/23 2000  vancomycin (VANCOREADY) IVPB 1500 mg/300 mL        1,500 mg 150 mL/hr over 120 Minutes Intravenous Every 24 hours 07/03/23 1817     07/03/23 2100  vancomycin (VANCOCIN) IVPB 1000 mg/200 mL premix       Placed in "Followed by" Linked Group   1,000 mg 200 mL/hr over 60  Minutes Intravenous  Once 07/03/23 1817 07/04/23 0736   07/03/23 2000  vancomycin (VANCOCIN) IVPB 1000 mg/200 mL premix        Placed in "Followed by" Linked Group   1,000 mg 200 mL/hr over 60 Minutes Intravenous  Once 07/03/23 1817 07/04/23 0736   07/03/23 0300  piperacillin-tazobactam (ZOSYN) IVPB 3.375 g        3.375 g 12.5 mL/hr over 240 Minutes Intravenous Every 8 hours 07/02/23 1900     07/02/23 1800  piperacillin-tazobactam (ZOSYN) IVPB 3.375 g        3.375 g 12.5 mL/hr over 240 Minutes Intravenous  Once 07/02/23 1745 07/03/23 0700   07/02/23 1745  piperacillin-tazobactam (ZOSYN) IVPB 4.5 g  Status:  Discontinued        4.5 g 200 mL/hr over 30 Minutes Intravenous  Once 07/02/23 1742 07/02/23 1745         Subjective: Patient laying in bed.  States some improvement with right upper quadrant pain after I&D.  Denies any chest pain or shortness of breath.  Passing flatus.  No bowel movement.  Tolerating current diet.   Objective: Vitals:   07/04/23 1751 07/04/23 1800 07/04/23 2146 07/05/23 0555  BP: (!) 149/83  113/72 124/78  Pulse: 72  70 65  Resp: 17  18 18   Temp: 97.6 F (36.4 C)  (!) 97.3 F (36.3 C) (!) 97.4 F (36.3 C)  TempSrc: Oral  Oral Oral  SpO2: 90% 95% 91% 92%  Weight:      Height:        Intake/Output Summary (Last 24 hours) at 07/05/2023 1255 Last data filed at 07/05/2023 0930 Gross per 24 hour  Intake 1854.93 ml  Output 1100 ml  Net 754.93 ml   Filed Weights   07/03/23 0337 07/03/23 1311 07/04/23 0602  Weight: 104.9 kg 104.9 kg 105.8 kg    Examination:  General exam: NAD Respiratory system: Lungs clear to auscultation bilaterally.  No wheezes, no crackles, no rhonchi.  Fair air movement.  Speaking in full sentences.  Cardiovascular system: Regular rate rhythm no murmurs rubs or gallops.  No JVD.  No pitting lower extremity edema. Gastrointestinal system: Abdomen is soft, nondistended, decreased tenderness to palpation in right upper quadrant.  OR dressing in place.  Positive bowel sounds.  No rebound.  No guarding.  Central nervous system: Alert and oriented. No focal  neurological deficits. Extremities: Symmetric 5 x 5 power. Skin: No rashes, lesions or ulcers Psychiatry: Judgement and insight appear normal. Mood & affect appropriate.     Data Reviewed: I have personally reviewed following labs and imaging studies  CBC: Recent Labs  Lab 07/02/23 1550 07/03/23 0446 07/04/23 0506 07/05/23 0533  WBC 10.7* 10.5 8.5 10.3  NEUTROABS 8.8*  --  7.3  --   HGB 13.6 12.7* 13.0 12.6*  HCT 44.3 41.2 41.1 41.3  MCV 84.9 84.9 84.2 86.8  PLT 381 363 337 384    Basic Metabolic Panel: Recent Labs  Lab 07/02/23 1550 07/03/23 0446 07/04/23 0506 07/04/23 0859 07/05/23 0533  NA 137 136 137  --  135  K 4.4 4.3 5.3* 4.8 4.0  CL 102 99 100  --  99  CO2 26 26 30   --  29  GLUCOSE 97 92 175*  --  140*  BUN 16 13 15   --  19  CREATININE 0.97 0.96 1.01  --  0.94  CALCIUM 9.4 9.0 9.2  --  8.8*  MG  --   --   --   --  2.2    GFR: Estimated Creatinine Clearance: 81.9 mL/min (by C-G formula based on SCr of 0.94 mg/dL).  Liver Function Tests: Recent Labs  Lab 07/02/23 1550 07/05/23 0533  AST 14* 14*  ALT 5 <5  ALKPHOS 54 53  BILITOT 1.3* 0.6  PROT 7.7 6.9  ALBUMIN 3.2* 2.6*    CBG: Recent Labs  Lab 07/02/23 2049 07/03/23 1705 07/05/23 0809 07/05/23 1125  GLUCAP 76 135* 102* 119*     Recent Results (from the past 240 hour(s))  Aerobic/Anaerobic Culture w Gram Stain (surgical/deep wound)     Status: None (Preliminary result)   Collection Time: 07/03/23  3:31 PM   Specimen: Abscess  Result Value Ref Range Status   Specimen Description   Final    ABSCESS Performed at [facility], [address] [place], [place] [address]    Special Requests   Final    NONE Performed at [facility], [address] [place], [place] [address]    Gram Stain   Final    ABUNDANT WBC PRESENT, PREDOMINANTLY PMN ABUNDANT GRAM POSITIVE COCCI MODERATE GRAM POSITIVE RODS FEW GRAM NEGATIVE RODS RARE GRAM NEGATIVE  COCCI Performed at [facility], [address] [place], [place] [address]    Culture PENDING  Incomplete   Report Status PENDING  Incomplete         Radiology Studies: NM HEPATOBILIARY LEAK (POST-SURGICAL)  Result Date: 07/04/2023 CLINICAL DATA:  Subtotal colectomy 04/05/2023. Elevated bilirubin. Evaluate for biliary leak. EXAM: NUCLEAR MEDICINE HEPATOBILIARY IMAGING TECHNIQUE: Sequential images of the abdomen were obtained out to 60 minutes following intravenous administration of radiopharmaceutical. RADIOPHARMACEUTICALS:  4.8 mCi Tc-61m  Choletec IV COMPARISON:  CT 07/02/2023 FINDINGS: Prompt uptake of radiotracer in the liver. Counts are evident within the common bile duct and small bowel 20 minutes. No evidence of extraluminal excreted radiotracer to suggest bile leak. Reflux into the stomach is noted. IMPRESSION: No evidence of biliary leak. Patent common bile duct. Electronically Signed   By: Genevive Bi M.D.   On: 07/04/2023 11:17        Scheduled Meds:  acetaminophen  650 mg Oral Q6H   amLODipine  5 mg Oral Daily   atorvastatin  40 mg Oral Daily   busPIRone  10 mg Oral TID   Carbidopa-Levodopa ER  2 tablet Oral TID   enoxaparin (LOVENOX) injection  40 mg Subcutaneous Q24H   losartan  50 mg Oral Q breakfast   magnesium hydroxide  15 mL Oral Once   polyethylene glycol  17 g Oral Daily   rOPINIRole  2 mg Oral TID   senna-docusate  1 tablet Oral QHS   simethicone  160 mg Oral QID   tamsulosin  0.4 mg Oral Daily   Continuous Infusions:  piperacillin-tazobactam (ZOSYN)  IV 3.375 g (07/05/23 0826)   vancomycin 1,500 mg (07/04/23 2114)     LOS: 3 days    Time spent: 35 minutes    Ramiro Harvest, MD [facility]   To contact the attending provider between 7A-7P or the covering provider during after hours 7P-7A, please log into the web site [email or web address] and access using universal [place] password for that web site. If you do not have the  password, please call the hospital operator.  07/05/2023, 12:55 PM

## 2023-07-06 ENCOUNTER — Inpatient Hospital Stay (HOSPITAL_COMMUNITY): Payer: Medicare HMO

## 2023-07-06 DIAGNOSIS — L0291 Cutaneous abscess, unspecified: Secondary | ICD-10-CM | POA: Diagnosis not present

## 2023-07-06 DIAGNOSIS — G4733 Obstructive sleep apnea (adult) (pediatric): Secondary | ICD-10-CM | POA: Diagnosis not present

## 2023-07-06 DIAGNOSIS — E119 Type 2 diabetes mellitus without complications: Secondary | ICD-10-CM | POA: Diagnosis not present

## 2023-07-06 DIAGNOSIS — E785 Hyperlipidemia, unspecified: Secondary | ICD-10-CM | POA: Diagnosis not present

## 2023-07-06 LAB — CBC
HCT: 44.4 % (ref 39.0–52.0)
Hemoglobin: 13.2 g/dL (ref 13.0–17.0)
MCH: 26 pg (ref 26.0–34.0)
MCHC: 29.7 g/dL — ABNORMAL LOW (ref 30.0–36.0)
MCV: 87.4 fL (ref 80.0–100.0)
Platelets: 406 10*3/uL — ABNORMAL HIGH (ref 150–400)
RBC: 5.08 MIL/uL (ref 4.22–5.81)
RDW: 14 % (ref 11.5–15.5)
WBC: 8.7 10*3/uL (ref 4.0–10.5)
nRBC: 0 % (ref 0.0–0.2)

## 2023-07-06 LAB — BASIC METABOLIC PANEL
Anion gap: 6 (ref 5–15)
BUN: 16 mg/dL (ref 8–23)
CO2: 27 mmol/L (ref 22–32)
Calcium: 8.7 mg/dL — ABNORMAL LOW (ref 8.9–10.3)
Chloride: 101 mmol/L (ref 98–111)
Creatinine, Ser: 0.93 mg/dL (ref 0.61–1.24)
GFR, Estimated: >60 mL/min (ref >60)
Glucose, Bld: 138 mg/dL — ABNORMAL HIGH (ref 70–99)
Potassium: 3.5 mmol/L (ref 3.5–5.1)
Sodium: 134 mmol/L — ABNORMAL LOW (ref 135–145)

## 2023-07-06 LAB — MAGNESIUM: Magnesium: 2.2 mg/dL (ref 1.7–2.4)

## 2023-07-06 MED ORDER — SIMETHICONE 80 MG PO CHEW
80.0000 mg | CHEWABLE_TABLET | Freq: Four times a day (QID) | ORAL | Status: DC | PRN
  Filled 2023-07-06: qty 1

## 2023-07-06 MED ORDER — IOHEXOL 300 MG/ML  SOLN
100.0000 mL | Freq: Once | INTRAMUSCULAR | Status: AC | PRN
  Administered 2023-07-06: 100 mL via INTRAVENOUS

## 2023-07-06 MED ORDER — SODIUM CHLORIDE 0.9 % IV SOLN
3.0000 g | Freq: Four times a day (QID) | INTRAVENOUS | Status: DC
  Administered 2023-07-06 – 2023-07-07 (×3): 3 g via INTRAVENOUS
  Filled 2023-07-06 (×4): qty 8

## 2023-07-06 MED ORDER — SENNOSIDES-DOCUSATE SODIUM 8.6-50 MG PO TABS: 1.0000 | ORAL_TABLET | Freq: Every day | ORAL | Status: DC

## 2023-07-06 MED ORDER — POLYETHYLENE GLYCOL 3350 17 G PO PACK
17.0000 g | PACK | Freq: Every day | ORAL | Status: DC
  Administered 2023-07-07: 17 g via ORAL
  Filled 2023-07-06: qty 1

## 2023-07-06 MED ORDER — IOHEXOL 9 MG/ML PO SOLN
500.0000 mL | ORAL | Status: AC
  Administered 2023-07-06 (×2): 500 mL via ORAL

## 2023-07-06 NOTE — Plan of Care (Signed)
?  Problem: Clinical Measurements: ?Goal: Will remain free from infection ?Outcome: Progressing ?  ?

## 2023-07-06 NOTE — Progress Notes (Signed)
Pharmacy Antibiotic Note  Jonathan Yu is a 72 y.o. male admitted on 07/02/2023 with abdominal wall abscess s/p I&D. Pharmacy has been consulted for vancomycin dosing. Patient also on Zosyn. ID has been consulted.  Plan: -Zosyn 3.375 g IV q8h extended infusion -Vancomycin 1500 mg IV q24h -Follow renal function, cultures and clinical progress for dose adjustments and de-escalation as indicated  Height: 5\' 5"  (165.1 cm) Weight: 105.8 kg (233 lb 4 oz) IBW/kg (Calculated) : 61.5  Temp (24hrs), Avg:97.6 F (36.4 C), Min:97.5 F (36.4 C), Max:97.9 F (36.6 C)  Recent Labs  Lab 07/02/23 1550 07/03/23 0446 07/04/23 0506 07/05/23 0533 07/06/23 0438  WBC 10.7* 10.5 8.5 10.3 8.7  CREATININE 0.97 0.96 1.01 0.94 0.93    Estimated Creatinine Clearance: 82.8 mL/min (by C-G formula based on SCr of 0.93 mg/dL).    Allergies  Allergen Reactions   Sesame Oil Hives and Shortness Of Breath   Clonazepam Hives and Other (See Comments)    Tolerates Ativan   Tape Other (See Comments)    White surgical tape caused some blisters and skin was "on fire for 6 months"   Percocet [Oxycodone-Acetaminophen] Rash   Topamax [Topiramate] Other (See Comments)    Cognitive decline    Antimicrobials this admission: Vancomycin 11/21 >> Zosyn 11/20 >>  Dose adjustments this admission: NA  Microbiology results: 11/21 Cx (abscess): K.oxytoca pending, polymicrobial gram stain  Thank you for allowing pharmacy to be a part of this patient's care.  Pricilla Riffle, PharmD, BCPS Clinical Pharmacist 07/06/2023 8:46 AM

## 2023-07-06 NOTE — Evaluation (Signed)
Occupational Therapy Evaluation Patient Details Name: Jonathan Yu MRN: [id number] DOB: 07/22/1951 Today's Date: 07/06/2023   History of Present Illness Patient is a 72 year old male who presented on 11/20 with concerns for abscess in right anterior abdominal wall. Patient underwent I &D on 11/21. PMH: DM II, HTN, hyperlipidemia, parkinsons, RLS, OSA, BPH, hyperkalemia, anxiety.   Clinical Impression   Patient evaluated by Occupational Therapy with no further acute OT needs identified. All education has been completed and the patient has no further questions. Patient endorsed being at baseline for ADLs at this time. Educated on ECT.  See below for any follow-up Occupational Therapy or equipment needs. OT is signing off. Thank you for this referral.        If plan is discharge home, recommend the following: A little help with bathing/dressing/bathroom;Assistance with cooking/housework;Direct supervision/assist for medications management;Assist for transportation;Help with stairs or ramp for entrance;Direct supervision/assist for financial management    Functional Status Assessment  Patient has not had a recent decline in their functional status  Equipment Recommendations  None recommended by OT       Precautions / Restrictions Precautions Precaution Comments: abdominal Restrictions Weight Bearing Restrictions: No      Mobility Bed Mobility Overal bed mobility: Modified Independent             General bed mobility comments: with HOB raised and bed rails.        Balance Overall balance assessment: No apparent balance deficits (not formally assessed)             ADL either performed or assessed with clinical judgement   ADL Overall ADL's : Needs assistance/impaired Eating/Feeding: Modified independent   Grooming: Wash/dry face;Oral care;Brushing hair;Wash/dry hands;Modified independent;Standing Grooming Details (indicate cue type and reason): at sink with  increased time. Upper Body Bathing: Modified independent;Sitting     Lower Body Bathing Details (indicate cue type and reason): has A for this at home already Upper Body Dressing : Modified independent;Sitting       Toilet Transfer: Supervision/safety;Ambulation;Rolling walker (2 wheels) Toilet Transfer Details (indicate cue type and reason): with cues to keep RW with him Toileting- Clothing Manipulation and Hygiene: Supervision/safety                Pertinent Vitals/Pain Pain Assessment Pain Assessment: Faces Faces Pain Scale: Hurts a little bit Pain Location: abdomen Pain Descriptors / Indicators: Constant Pain Intervention(s): Premedicated before session, Limited activity within patient's tolerance, Monitored during session     Extremity/Trunk Assessment Upper Extremity Assessment Upper Extremity Assessment: Overall WFL for tasks assessed   Lower Extremity Assessment Lower Extremity Assessment: Defer to PT evaluation   Cervical / Trunk Assessment Cervical / Trunk Assessment: Normal;Other exceptions Cervical / Trunk Exceptions: limited cervical movement during session.      Cognition Arousal: Alert Behavior During Therapy: WFL for tasks assessed/performed Overall Cognitive Status: Within Functional Limits for tasks assessed                          Home Living Family/patient expects to be discharged to:: Private residence Living Arrangements: Children (lives with multiple children) Available Help at Discharge: Family;Available PRN/intermittently Type of Home: House Home Access: Stairs to enter [employer] of Steps: 2 Entrance Stairs-Rails: None Home Layout: Two level;Full bath on main level Alternate Level Stairs-Number of Steps: flight   Bathroom Shower/Tub: Producer, television/film/video: Standard     Home Equipment: Scientist, physiological Equipment: Actor  Comments: 3 kids who live in house with him  and can A him when they are home (2 work and 1 in school)      Prior Functioning/Environment Prior Level of Function : Needs assist       Physical Assist : ADLs (physical)   ADLs (physical): Dressing Mobility Comments: amb with  cane ADLs Comments: was having some help with bathing and dressing (especailly LB) reported its quicker to have help sometimes.        OT Problem List: Decreased strength;Impaired balance (sitting and/or standing);Decreased coordination;Decreased safety awareness;Decreased knowledge of precautions;Decreased knowledge of use of DME or AE      OT Treatment/Interventions: Self-care/ADL training;Therapeutic exercise;DME and/or AE instruction;Therapeutic activities;Patient/family education;Balance training    OT Goals(Current goals can be found in the care plan section) Acute Rehab OT Goals Patient Stated Goal: to go home OT Goal Formulation: All assessment and education complete, DC therapy  OT Frequency: Min 1X/week       AM-PAC OT "6 Clicks" Daily Activity     Outcome Measure Help from another person eating meals?: None Help from another person taking care of personal grooming?: None Help from another person toileting, which includes using toliet, bedpan, or urinal?: A Little Help from another person bathing (including washing, rinsing, drying)?: A Little Help from another person to put on and taking off regular upper body clothing?: None Help from another person to put on and taking off regular lower body clothing?: A Little 6 Click Score: 21   End of Session Equipment Utilized During Treatment: Gait belt;Rolling walker (2 wheels) Nurse Communication: Other (comment) (nurse in room at end of session)  Activity Tolerance: Patient tolerated treatment well Patient left: in chair;with call bell/phone within reach;with nursing/sitter in room  OT Visit Diagnosis: Unsteadiness on feet (R26.81);Other abnormalities of gait and mobility (R26.89);Muscle  weakness (generalized) (M62.81);Pain                Time: 9528-4132 OT Time Calculation (min): 21 min Charges:  OT General Charges $OT Visit: 1 Visit OT Evaluation $OT Eval Low Complexity: 1 Low  Mishawn Hemann OTR/L, MS Acute Rehabilitation Department Office# [phone]   Selinda Flavin 07/06/2023, 10:01 AM

## 2023-07-06 NOTE — Progress Notes (Signed)
   07/06/23 2000  BiPAP/CPAP/SIPAP  Reason BIPAP/CPAP not in use Non-compliant

## 2023-07-06 NOTE — Progress Notes (Signed)
3 Days Post-Op   Subjective/Chief Complaint: Feels very well today. Tolerating packing changes well and pre-op pain is mostly resolved. Had a large bowel movement after bowel regimen. Dr. Andrey Campanile saw him yesterday.    Objective: Vital signs in last 24 hours: Temp:  [97.5 F (36.4 C)-97.9 F (36.6 C)] 97.5 F (36.4 C) (11/24 0540) Pulse Rate:  [62-75] 62 (11/24 0540) Resp:  [16-18] 18 (11/24 0540) BP: (124-151)/(64-81) 151/81 (11/24 0540) SpO2:  [94 %-96 %] 94 % (11/24 0540) Last BM Date : 07/05/23  Intake/Output from previous day: 11/23 0701 - 11/24 0700 In: 1495.4 [P.O.:1080; IV Piggyback:415.4] Out: 500 [Urine:500] Intake/Output this shift: No intake/output data recorded.  Alert well-appearing Unlabored respirations Abdomen soft, improved tenderness and right upper quadrant.  Packing is clean and was just replaced. No cellulitis  Lab Results:  Recent Labs    07/05/23 0533 07/06/23 0438  WBC 10.3 8.7  HGB 12.6* 13.2  HCT 41.3 44.4  PLT 384 406*   BMET Recent Labs    07/05/23 0533 07/06/23 0438  NA 135 134*  K 4.0 3.5  CL 99 101  CO2 29 27  GLUCOSE 140* 138*  BUN 19 16  CREATININE 0.94 0.93  CALCIUM 8.8* 8.7*   PT/INR No results for input(s): "LABPROT", "INR" in the last 72 hours.  ABG No results for input(s): "PHART", "HCO3" in the last 72 hours.  Invalid input(s): "PCO2", "PO2"  Studies/Results: NM HEPATOBILIARY LEAK (POST-SURGICAL)  Result Date: 07/04/2023 CLINICAL DATA:  Subtotal colectomy 04/05/2023. Elevated bilirubin. Evaluate for biliary leak. EXAM: NUCLEAR MEDICINE HEPATOBILIARY IMAGING TECHNIQUE: Sequential images of the abdomen were obtained out to 60 minutes following intravenous administration of radiopharmaceutical. RADIOPHARMACEUTICALS:  4.8 mCi Tc-50m  Choletec IV COMPARISON:  CT 07/02/2023 FINDINGS: Prompt uptake of radiotracer in the liver. Counts are evident within the common bile duct and small bowel 20 minutes. No evidence of  extraluminal excreted radiotracer to suggest bile leak. Reflux into the stomach is noted. IMPRESSION: No evidence of biliary leak. Patent common bile duct. Electronically Signed   By: Genevive Bi M.D.   On: 07/04/2023 11:17    Anti-infectives: Anti-infectives (From admission, onward)    Start     Dose/Rate Route Frequency Ordered Stop   07/04/23 2000  vancomycin (VANCOREADY) IVPB 1500 mg/300 mL        1,500 mg 150 mL/hr over 120 Minutes Intravenous Every 24 hours 07/03/23 1817     07/03/23 2100  vancomycin (VANCOCIN) IVPB 1000 mg/200 mL premix       Placed in "Followed by" Linked Group   1,000 mg 200 mL/hr over 60 Minutes Intravenous  Once 07/03/23 1817 07/04/23 0736   07/03/23 2000  vancomycin (VANCOCIN) IVPB 1000 mg/200 mL premix       Placed in "Followed by" Linked Group   1,000 mg 200 mL/hr over 60 Minutes Intravenous  Once 07/03/23 1817 07/04/23 0736   07/03/23 0300  piperacillin-tazobactam (ZOSYN) IVPB 3.375 g        3.375 g 12.5 mL/hr over 240 Minutes Intravenous Every 8 hours 07/02/23 1900     07/02/23 1800  piperacillin-tazobactam (ZOSYN) IVPB 3.375 g        3.375 g 12.5 mL/hr over 240 Minutes Intravenous  Once 07/02/23 1745 07/03/23 0700   07/02/23 1745  piperacillin-tazobactam (ZOSYN) IVPB 4.5 g  Status:  Discontinued        4.5 g 200 mL/hr over 30 Minutes Intravenous  Once 07/02/23 1742 07/02/23 1745  Assessment/Plan:  Abdominal wall abscess-status post I&D 11/21.  Output was purulent, nonbilious, nonfeculent.  Abscess is intramuscular and below the muscle; cultures pending but gram stain polymicrobial S/p subtotal cholecystectomy 8/2 with expected post op bile leak.  Bile leak resolved with stenting and stent has subsequently been removed.  Retained stones HIDA 11/22 negative for recurrent bile leak  Question of enteric fistula to skin via former drain tract on CT based on decreased/lack of fat planes.  I think this is unlikely at this point, but will  proceed with CT with enteral contrast to evaluate for duodenal or colonic fistula. If this is negative, anticipate discharge home tomorrow on PO abx with local wound care.     LOS: 4 days    Jonathan Yu 07/06/2023

## 2023-07-06 NOTE — Evaluation (Signed)
Physical Therapy Evaluation Patient Details Name: Jonathan Yu MRN: [id number] DOB: [birth year] Today's Date: 07/06/2023  History of Present Illness  Patient is a 72 year old male who presented on 11/20 with concerns for abscess in right anterior abdominal wall. Patient underwent I &D on 11/21. PMH: DM II, HTN, hyperlipidemia, parkinsons, RLS, OSA, BPH, hyperkalemia, anxiety.  Clinical Impression  Patient evaluated by Physical Therapy with no further acute PT needs identified. All education has been completed and the patient has no further questions.  Pt amb ~ 300' without device, uses cane intermittently at baseline. Pt states he is at his baseline, no further PT needs at this time   See below for any follow-up Physical Therapy or equipment needs. PT is signing off. Thank you for this referral.         If plan is discharge home, recommend the following: A little help with bathing/dressing/bathroom;Help with stairs or ramp for entrance   Can travel by private vehicle        Equipment Recommendations None recommended by PT  Recommendations for Other Services       Functional Status Assessment Patient has not had a recent decline in their functional status     Precautions / Restrictions Precautions Precaution Comments: abdominal Restrictions Weight Bearing Restrictions: No      Mobility  Bed Mobility Overal bed mobility: Modified Independent             General bed mobility comments: with HOB raised and bed rails.    Transfers Overall transfer level: Modified independent                      Ambulation/Gait Ambulation/Gait assistance: Supervision Gait Distance (Feet): 300 Feet Assistive device: Rolling walker (2 wheels) Gait Pattern/deviations: Step-through pattern, Decreased stride length, Wide base of support       General Gait Details: trunk rigid, decr arm swing and decr joint excursion throughout LEs. steady without device and no  LOB  noted  Stairs            Wheelchair Mobility     Tilt Bed    Modified Rankin (Stroke Patients Only)       Balance   Sitting-balance support: No upper extremity supported, Feet supported Sitting balance-Leahy Scale: Good     Standing balance support: During functional activity, No upper extremity supported   Standing balance comment: Fair+, no tested to mod challenges             High level balance activites: Side stepping, Backward walking, Turns High Level Balance Comments: no LOB with above, slower gait, guarded initially             Pertinent Vitals/Pain Pain Assessment Pain Assessment: No/denies pain    Home Living Family/patient expects to be discharged to:: Private residence (lives wtih 3 children age 75-15 yo) Living Arrangements: Children Available Help at Discharge: Family;Available PRN/intermittently Type of Home: House Home Access: Stairs to enter Entrance Stairs-Rails: None Entrance Stairs-Number of Steps: 2 Alternate Level Stairs-Number of Steps: flight Home Layout: Two level;Full bath on main level Home Equipment: Adaptive equipment;Cane - single point Additional Comments: 3 kids who live in house with him and can A him when they are home (2 work and 1 in school)    Prior Function Prior Level of Function : Needs assist       Physical Assist : ADLs (physical)   ADLs (physical): Dressing Mobility Comments: amb with  cane at times ADLs Comments: was  having some help with bathing and dressing (especailly LB) reported its quicker to have help sometimes.     Extremity/Trunk Assessment   Upper Extremity Assessment Upper Extremity Assessment: Defer to OT evaluation;Overall [facility] for tasks assessed    Lower Extremity Assessment Lower Extremity Assessment: Overall WFL for tasks assessed    Cervical / Trunk Assessment Cervical / Trunk Assessment: Normal;Other exceptions Cervical / Trunk Exceptions: limited cervical and lumbar rotation   Communication   Communication Communication: No apparent difficulties  Cognition Arousal: Alert Behavior During Therapy: WFL for tasks assessed/performed Overall Cognitive Status: Within Functional Limits for tasks assessed                                          General Comments      Exercises     Assessment/Plan    PT Assessment Patient does not need any further PT services  PT Problem List         PT Treatment Interventions      PT Goals (Current goals can be found in the Care Plan section)  Acute Rehab PT Goals PT Goal Formulation: All assessment and education complete, DC therapy    Frequency       Co-evaluation               AM-PAC PT "6 Clicks" Mobility  Outcome Measure Help needed turning from your back to your side while in a flat bed without using bedrails?: A Little Help needed moving from lying on your back to sitting on the side of a flat bed without using bedrails?: A Little Help needed moving to and from a bed to a chair (including a wheelchair)?: None Help needed standing up from a chair using your arms (e.g., wheelchair or bedside chair)?: None Help needed to walk in hospital room?: None Help needed climbing 3-5 steps with a railing? : None 6 Click Score: 22    End of Session Equipment Utilized During Treatment: Gait belt Activity Tolerance: Patient tolerated treatment well Patient left: with call bell/phone within reach;in chair   PT Visit Diagnosis: Other abnormalities of gait and mobility (R26.89)    Time: 1610-9604 PT Time Calculation (min) (ACUTE ONLY): 13 min   Charges:   PT Evaluation $PT Eval Low Complexity: 1 Low   PT General Charges $$ ACUTE PT VISIT: 1 Visit         Zak Gondek, PT  Acute Rehab Dept [facility]) [phone]  07/06/2023   [facility] 07/06/2023, 11:33 AM

## 2023-07-06 NOTE — Progress Notes (Addendum)
PROGRESS NOTE    Jonathan Yu  RUE:[id number] DOB: 11-21-1950 DOA: 07/02/2023 PCP: Everrett Coombe, DO    Chief Complaint  Patient presents with   Abdominal Pain   Bloated    Brief Narrative:  Patient 72 year old gentleman history of gangrenous cholecystitis status post subtotal cholecystectomy 03/2023, hypertension, hyperlipidemia, OSA on CPAP, parkinsonism, type 2 diabetes presented with concern for abscess in the right anterior abdominal wall musculature along with possible enteric fistula.  Patient seen in the ED, CBC done with a mild leukocytosis.  Lipase level normal.  Urinalysis unremarkable.  CT abdomen and pelvis done with a 5.2 x 3.7 cm heterogeneous collection in the right anterior abdominal wall concerning for an abscess along with findings concerning for enteric fistula.  General surgery consulted and patient for probable I&D today 07/03/2023.   Assessment & Plan:   Principal Problem:   Abscess Active Problems:   Essential hypertension, benign   Dyslipidemia   Type 2 diabetes mellitus without complication, without long-term current use of insulin (HCC)   OSA on CPAP   Parkinsonism (HCC)   Acute gangrenous cholecystitis s/p subtotal cholecysectomy 03/14/2023   Enterocutaneous fistula  #1 abscess in right anterior abdominal wall/possible enteric fistula/history of gangrenous cholecystitis status post subtotal cholecystectomy 03/2023 -Patient presented with concern for abscess in the right upper quadrant abdominal region tender to palpation, noted to have a leukocytosis. -CT abdomen and pelvis done concerning for abscess and also possible enteric fistula. -Patient seen in consultation by general surgery who have assessed the patient and patient underwent incision and debridement of abdominal wall abscess involving subcutaneous tissue, fascia and muscle with purulence noted and culture sent.   -Preliminary cultures with abundant Klebsiella oxytoca, abundant Streptococcus  anginosus, abundant Streptococcus constellatus with sensitivities pending. -Was on IV vancomycin and Zosyn and antibiotics have been narrowed down to IV Unasyn per ID.   -DC IV vancomycin and IV Zosyn. -Continue pain management, supportive care.   -ID consulted and following and appreciate their input and recommendations.  -Due to concern of enteric fistula to skin via formal drain tract CT with enteral contrast ordered per general surgery to evaluate for duodenal or colonic fistula. -General Surgery following and appreciate input and recommendations.  2.  Hypertension -Norvasc, losartan.    3.  Hyperlipidemia -Statin.  4.  Diabetes mellitus type 2 -Hemoglobin A1c 6.1 (07/02/2023) -Blood glucose of 138 on morning labs today.  -Continue to hold home dose oral hypoglycemic agents. -Follow.  5.  Parkinsonism/RLS -Continue carbidopa levodopa and Requip.  6.  OSA -CPAP nightly.  7.  BPH -Flomax  8.  Anxiety -Continue BuSpar.    9.  Hyperkalemia -Resolved.   -Potassium at 3.5.  10.  Constipation -Patient stated had large bowel movement on bowel regimen of MiraLAX twice daily, Senokot-S twice daily and a dose of sorbitol.   -Continue current bowel regimen.    DVT prophylaxis: Lovenox Code Status: Full Family Communication: Updated patient.  No family at bedside. Disposition: Likely home when clinically improved and cleared by general surgery.  Status is: Inpatient Remains inpatient appropriate because: Severity of illness   Consultants:  General Surgery: Dr. Donell Beers 07/02/2023 Infectious disease: Dr. Drue Second 07/04/2023  Procedures:  CT abdomen and pelvis 07/02/2023 Incision and debridement of abdominal wall abscess involving subcutaneous tissue, fascia and muscle per general surgery: Dr. Fredricka Bonine 07/03/2023  Antimicrobials:  Anti-infectives (From admission, onward)    Start     Dose/Rate Route Frequency Ordered Stop   07/04/23 2000  vancomycin (VANCOREADY) IVPB 1500  mg/300 mL        1,500 mg 150 mL/hr over 120 Minutes Intravenous Every 24 hours 07/03/23 1817     07/03/23 2100  vancomycin (VANCOCIN) IVPB 1000 mg/200 mL premix       Placed in "Followed by" Linked Group   1,000 mg 200 mL/hr over 60 Minutes Intravenous  Once 07/03/23 1817 07/04/23 0736   07/03/23 2000  vancomycin (VANCOCIN) IVPB 1000 mg/200 mL premix       Placed in "Followed by" Linked Group   1,000 mg 200 mL/hr over 60 Minutes Intravenous  Once 07/03/23 1817 07/04/23 0736   07/03/23 0300  piperacillin-tazobactam (ZOSYN) IVPB 3.375 g        3.375 g 12.5 mL/hr over 240 Minutes Intravenous Every 8 hours 07/02/23 1900     07/02/23 1800  piperacillin-tazobactam (ZOSYN) IVPB 3.375 g        3.375 g 12.5 mL/hr over 240 Minutes Intravenous  Once 07/02/23 1745 07/03/23 0700   07/02/23 1745  piperacillin-tazobactam (ZOSYN) IVPB 4.5 g  Status:  Discontinued        4.5 g 200 mL/hr over 30 Minutes Intravenous  Once 07/02/23 1742 07/02/23 1745         Subjective: Patient laying in bed drinking contrast in anticipation of CT this afternoon.  Denies any chest pain or shortness of breath.  Feels right upper quadrant abdominal pain improving daily.  Stated had bowel movement.   Objective: Vitals:   07/05/23 0555 07/05/23 1326 07/05/23 2132 07/06/23 0540  BP: 124/78 134/75 124/64 (!) 151/81  Pulse: 65 75 66 62  Resp: 18 16 18 18   Temp: (!) 97.4 F (36.3 C) 97.9 F (36.6 C) (!) 97.5 F (36.4 C) (!) 97.5 F (36.4 C)  TempSrc: Oral Oral Oral Oral  SpO2: 92% 94% 96% 94%  Weight:      Height:        Intake/Output Summary (Last 24 hours) at 07/06/2023 1308 Last data filed at 07/06/2023 1248 Gross per 24 hour  Intake 996.35 ml  Output 625 ml  Net 371.35 ml   Filed Weights   07/03/23 0337 07/03/23 1311 07/04/23 0602  Weight: 104.9 kg 104.9 kg 105.8 kg    Examination:  General exam: NAD Respiratory system: CTAB anterior lung fields.  No wheezes, no crackles, no rhonchi.  Fair air  movement.  Speaking in full sentences.  Cardiovascular system: RRR no murmurs rubs or gallops.  No JVD.  No pitting lower extremity edema.  Gastrointestinal system: Abdomen is soft, nondistended, decreased tenderness to palpation right upper quadrant.  Oral dressing in place.  Positive bowel sounds.  No rebound.  No guarding.  Central nervous system: Alert and oriented. No focal neurological deficits. Extremities: Symmetric 5 x 5 power. Skin: No rashes, lesions or ulcers Psychiatry: Judgement and insight appear normal. Mood & affect appropriate.     Data Reviewed: I have personally reviewed following labs and imaging studies  CBC: Recent Labs  Lab 07/02/23 1550 07/03/23 0446 07/04/23 0506 07/05/23 0533 07/06/23 0438  WBC 10.7* 10.5 8.5 10.3 8.7  NEUTROABS 8.8*  --  7.3  --   --   HGB 13.6 12.7* 13.0 12.6* 13.2  HCT 44.3 41.2 41.1 41.3 44.4  MCV 84.9 84.9 84.2 86.8 87.4  PLT 381 363 337 384 406*    Basic Metabolic Panel: Recent Labs  Lab 07/02/23 1550 07/03/23 0446 07/04/23 0506 07/04/23 0859 07/05/23 0533 07/06/23 0438  NA 137 136 137  --  135 134*  K 4.4 4.3 5.3* 4.8 4.0 3.5  CL 102 99 100  --  99 101  CO2 26 26 30   --  29 27  GLUCOSE 97 92 175*  --  140* 138*  BUN 16 13 15   --  19 16  CREATININE 0.97 0.96 1.01  --  0.94 0.93  CALCIUM 9.4 9.0 9.2  --  8.8* 8.7*  MG  --   --   --   --  2.2 2.2    GFR: Estimated Creatinine Clearance: 82.8 mL/min (by C-G formula based on SCr of 0.93 mg/dL).  Liver Function Tests: Recent Labs  Lab 07/02/23 1550 07/05/23 0533  AST 14* 14*  ALT 5 <5  ALKPHOS 54 53  BILITOT 1.3* 0.6  PROT 7.7 6.9  ALBUMIN 3.2* 2.6*    CBG: Recent Labs  Lab 07/02/23 2049 07/03/23 1705 07/05/23 0809 07/05/23 1125 07/05/23 1628  GLUCAP 76 135* 102* 119* 158*     Recent Results (from the past 240 hour(s))  Aerobic/Anaerobic Culture w Gram Stain (surgical/deep wound)     Status: None (Preliminary result)   Collection Time: 07/03/23   3:31 PM   Specimen: Abscess  Result Value Ref Range Status   Specimen Description   Final    ABSCESS Performed at [facility], [address] [place], [place] [address]    Special Requests   Final    NONE Performed at [facility], [address] [place], [place] [address]    Gram Stain   Final    ABUNDANT WBC PRESENT, PREDOMINANTLY PMN ABUNDANT GRAM POSITIVE COCCI MODERATE GRAM POSITIVE RODS FEW GRAM NEGATIVE RODS RARE GRAM NEGATIVE COCCI    Culture   Final    ABUNDANT KLEBSIELLA OXYTOCA CULTURE REINCUBATED FOR BETTER GROWTH Performed at [facility], [address] [place], [place] [address]    Report Status PENDING  Incomplete   Organism ID, Bacteria KLEBSIELLA OXYTOCA  Final      Susceptibility   Klebsiella oxytoca - MIC*    AMPICILLIN RESISTANT Resistant     CEFEPIME <=0.12 SENSITIVE Sensitive     CEFTAZIDIME <=1 SENSITIVE Sensitive     CEFTRIAXONE <=0.25 SENSITIVE Sensitive     CIPROFLOXACIN <=0.25 SENSITIVE Sensitive     GENTAMICIN <=1 SENSITIVE Sensitive     IMIPENEM <=0.25 SENSITIVE Sensitive     TRIMETH/SULFA <=20 SENSITIVE Sensitive     AMPICILLIN/SULBACTAM 4 SENSITIVE Sensitive     PIP/TAZO <=4 SENSITIVE Sensitive ug/mL    * ABUNDANT KLEBSIELLA OXYTOCA         Radiology Studies: No results found.      Scheduled Meds:  acetaminophen  650 mg Oral Q6H   amLODipine  5 mg Oral Daily   atorvastatin  40 mg Oral Daily   busPIRone  10 mg Oral TID   Carbidopa-Levodopa ER  2 tablet Oral TID   enoxaparin (LOVENOX) injection  40 mg Subcutaneous Q24H   losartan  50 mg Oral Q breakfast   polyethylene glycol  17 g Oral Daily   rOPINIRole  2 mg Oral TID   senna-docusate  1 tablet Oral BID   simethicone  160 mg Oral QID   tamsulosin  0.4 mg Oral Daily   Continuous Infusions:  piperacillin-tazobactam (ZOSYN)  IV 3.375 g (07/06/23 0753)   vancomycin 1,500 mg (07/05/23 1948)     LOS: 4 days    Time spent:  35 minutes    Ramiro Harvest, MD [facility]  To contact the attending provider between 7A-7P or the covering provider during after hours 7P-7A, please log into the web site [email or web address] and access using universal [place] password for that web site. If you do not have the password, please call the hospital operator.  07/06/2023, 1:08 PM

## 2023-07-06 NOTE — Progress Notes (Signed)
       Patient with Kleb pneumonia on culture  Narrowed to unasyn   Jonathan Yu [place] 07/06/2023, 5:26 PM

## 2023-07-06 NOTE — TOC Progression Note (Signed)
Transition of Care [facility]) - Progression Note    Patient Details  Name: Jonathan Yu MRN: [id number] Date of Birth: Mar 08, 1951  [facility]) CM/SW Contact  Adrian Prows, RN Phone Number: 07/06/2023, 10:40 AM  Clinical Narrative:    Order received for [facility]; awaiting PT eval before setting up [facility] services.        Expected Discharge Plan and Services                                               Social Determinants of Health (SDOH) Interventions SDOH Screenings   Food Insecurity: No Food Insecurity (07/02/2023)  Housing: Patient Declined (07/02/2023)  Transportation Needs: No Transportation Needs (07/02/2023)  Utilities: Not At Risk (07/02/2023)  Alcohol Screen: Low Risk  (05/06/2023)  Depression (PHQ2-9): Medium Risk (05/06/2023)  Financial Resource Strain: Low Risk  (05/05/2023)  Physical Activity: Inactive (05/05/2023)  Social Connections: Moderately Isolated (05/06/2023)  Stress: Stress Concern Present (05/05/2023)  Tobacco Use: High Risk (07/03/2023)  Health Literacy: Adequate Health Literacy (05/06/2023)    Readmission Risk Interventions    03/19/2023   11:34 AM  Readmission Risk Prevention Plan  Post Dischage Appt Complete  Medication Screening Complete  Transportation Screening Complete

## 2023-07-07 ENCOUNTER — Other Ambulatory Visit (HOSPITAL_COMMUNITY): Payer: Self-pay

## 2023-07-07 DIAGNOSIS — E119 Type 2 diabetes mellitus without complications: Secondary | ICD-10-CM | POA: Diagnosis not present

## 2023-07-07 DIAGNOSIS — I10 Essential (primary) hypertension: Secondary | ICD-10-CM | POA: Diagnosis not present

## 2023-07-07 DIAGNOSIS — L0291 Cutaneous abscess, unspecified: Secondary | ICD-10-CM | POA: Diagnosis not present

## 2023-07-07 DIAGNOSIS — G20C Parkinsonism, unspecified: Secondary | ICD-10-CM | POA: Diagnosis not present

## 2023-07-07 LAB — BASIC METABOLIC PANEL
Anion gap: 10 (ref 5–15)
BUN: 13 mg/dL (ref 8–23)
CO2: 26 mmol/L (ref 22–32)
Calcium: 9.2 mg/dL (ref 8.9–10.3)
Chloride: 104 mmol/L (ref 98–111)
Creatinine, Ser: 0.9 mg/dL (ref 0.61–1.24)
GFR, Estimated: >60 mL/min (ref >60)
Glucose, Bld: 113 mg/dL — ABNORMAL HIGH (ref 70–99)
Potassium: 3.8 mmol/L (ref 3.5–5.1)
Sodium: 140 mmol/L (ref 135–145)

## 2023-07-07 LAB — AEROBIC/ANAEROBIC CULTURE W GRAM STAIN (SURGICAL/DEEP WOUND)

## 2023-07-07 LAB — CBC WITH DIFFERENTIAL/PLATELET
Abs Immature Granulocytes: 0.15 10*3/uL — ABNORMAL HIGH (ref 0.00–0.07)
Basophils Absolute: 0.1 10*3/uL (ref 0.0–0.1)
Basophils Relative: 1 %
Eosinophils Absolute: 0.4 10*3/uL (ref 0.0–0.5)
Eosinophils Relative: 5 %
HCT: 43.6 % (ref 39.0–52.0)
Hemoglobin: 13.5 g/dL (ref 13.0–17.0)
Immature Granulocytes: 2 %
Lymphocytes Relative: 24 %
Lymphs Abs: 2.2 10*3/uL (ref 0.7–4.0)
MCH: 26.6 pg (ref 26.0–34.0)
MCHC: 31 g/dL (ref 30.0–36.0)
MCV: 85.8 fL (ref 80.0–100.0)
Monocytes Absolute: 0.7 10*3/uL (ref 0.1–1.0)
Monocytes Relative: 8 %
Neutro Abs: 5.7 10*3/uL (ref 1.7–7.7)
Neutrophils Relative %: 60 %
Platelets: 380 10*3/uL (ref 150–400)
RBC: 5.08 MIL/uL (ref 4.22–5.81)
RDW: 14.1 % (ref 11.5–15.5)
WBC: 9.2 10*3/uL (ref 4.0–10.5)
nRBC: 0 % (ref 0.0–0.2)

## 2023-07-07 MED ORDER — SENNOSIDES-DOCUSATE SODIUM 8.6-50 MG PO TABS: 1.0000 | ORAL_TABLET | Freq: Every day | ORAL | Status: DC

## 2023-07-07 MED ORDER — POLYETHYLENE GLYCOL 3350 17 G PO PACK: 17.0000 g | PACK | Freq: Every day | ORAL | Status: AC

## 2023-07-07 MED ORDER — AMOXICILLIN-POT CLAVULANATE 875-125 MG PO TABS
1.0000 | ORAL_TABLET | Freq: Two times a day (BID) | ORAL | 0 refills | Status: AC
  Filled 2023-07-07: qty 20, 10d supply, fill #0

## 2023-07-07 NOTE — Discharge Summary (Signed)
Physician Discharge Summary  Jonathan Yu DDU:[id number] DOB: 1951-03-04 DOA: 07/02/2023  PCP: Everrett Coombe, DO  Admit date: 07/02/2023 Discharge date: 07/07/2023  Time spent: 60 minutes  Recommendations for Outpatient Follow-up:  Follow-up with Dr. Cliffton Asters, general surgery on 07/30/2023.   Discharge Diagnoses:  Principal Problem:   Abscess Active Problems:   Essential hypertension, benign   Dyslipidemia   Type 2 diabetes mellitus without complication, without long-term current use of insulin (HCC)   OSA on CPAP   Parkinsonism (HCC)   Acute gangrenous cholecystitis s/p subtotal cholecysectomy 03/14/2023   Enterocutaneous fistula   Discharge Condition: Stable and improved.  Diet recommendation: Carb modified diet  Filed Weights   07/03/23 2376 07/03/23 1311 07/04/23 0602  Weight: 104.9 kg 104.9 kg 105.8 kg    History of present illness:  HPI per Dr. Tenna Delaine Sisco is a 72 y.o. male with medical history significant of gangrenous cholecystitis s/p subtotal cholecystectomy (03/2023), HTN, dyslipidemia, OSA on CPAP, parkinsonism, and type 2 diabetes presenting with likely abscess in the right anterior abdominal wall musculature along with a possible enteric fistula.   In August 2024, he was hospitalized for a gangrenous cholecystitis and underwent a subtotal cholecystectomy complicated by biliary leak, common bile duct stent placement and a percutaneous drain which have since been removed.   Since his surgery, he has been having pain intermittently in his right abdomen. 5 weeks ago, was seen in the ED for pain.  Imaging at that time did not reveal any abscess or fistula formation.  He was prescribed oral Dilaudid for pain control and subsequently discharged with outpatient follow-up with his surgeon Dr. Cliffton Asters.  Was better for a couple of days but began to worsen again thereafter. Has been experiencing moderate pain for the past few weeks.   On Sunday, he noticed a lump  about 2x3cm on right abdomen. Has been very painful and sensitive to touch. He does endorse chills last night. Otherwise, no N/V, fevers, chest pain, SHOB, urinary changes. Does endorse mild constipation.    Of note, patient does have a history of parkinsonism and is currently taking carbidopa-levodopa.  He is also on Requip for restless leg syndrome.   ED course: CBC with a mild leukocytosis but otherwise stable findings.  CMP with mild hypoalbuminemia and a mildly elevated T. bili.  Lipase normal.  UA is clean.  CT abdomen pelvis with contrast does reveal a 5.2 x 3.7 cm heterogenous collection in the right anterior abdominal wall concerning for an abscess along with findings concerning for an enteric fistula.  ED provider has already consulted surgery.  [facility] hospitalist consulted for admission.  Hospital Course:  #1 abscess in right anterior abdominal wall/possible enteric fistula/history of gangrenous cholecystitis status post subtotal cholecystectomy 03/2023 -Patient presented with concern for abscess in the right upper quadrant abdominal region tender to palpation, noted to have a leukocytosis. -CT abdomen and pelvis done concerning for abscess and also possible enteric fistula. -Patient seen in consultation by general surgery who have assessed the patient and patient underwent incision and debridement of abdominal wall abscess involving subcutaneous tissue, fascia and muscle with purulence noted and culture sent.   -ID consulted and followed the patient during the hospitalization. -Cultures with abundant Klebsiella oxytoca, abundant Streptococcus anginosus, abundant Streptococcus constellatus with sensitivities pending. -Was on IV vancomycin and Zosyn and antibiotics narrowed down to IV Unasyn per ID.   -Patient improved clinically. -Due to concern of enteric fistula to skin via formal drain tract CT with enteral contrast  ordered per general surgery to evaluate for duodenal or colonic  fistula. -CT done was negative for enterocutaneous fistula/colocutaneous fistula.  General surgery however noted small GB fossa collection/retained stones. -Patient cleared by general surgery for discharge with close outpatient follow-up. -Patient was discharged on 10 more days of Augmentin per ID recommendations to complete a course of antibiotic treatment. -Patient will be discharged in stable and improved condition.   2.  Hypertension -Patient maintained on home regimen Norvasc, losartan.     3.  Hyperlipidemia -Patient maintained on statin.   4.  Diabetes mellitus type 2 -Hemoglobin A1c 6.1 (07/02/2023) -Patient home dose oral hypoglycemic agents were held during the hospitalization and will be resumed on discharge.     5.  Parkinsonism/RLS -Patient maintained on home regimen carbidopa levodopa and Requip.   6.  OSA -CPAP nightly.   7.  BPH -Patient maintained on Flomax   8.  Anxiety -Patient maintained on home regimen BuSpar.     9.  Hyperkalemia -Resolved.   -Outpatient follow-up.   10.  Constipation -Patient stated had large bowel movement on bowel regimen of MiraLAX twice daily, Senokot-S twice daily and a dose of sorbitol.   -Patient maintained on bowel regimen during the hospitalization which she will be discharged home on.     Procedures: CT abdomen and pelvis 07/02/2023 Incision and debridement of abdominal wall abscess involving subcutaneous tissue, fascia and muscle per general surgery: Dr. Fredricka Bonine 07/03/2023  Consultations: General Surgery: Dr. Donell Beers 07/02/2023 Infectious disease: Dr. Drue Second 07/04/2023  Discharge Exam: Vitals:   07/06/23 2202 07/07/23 0535  BP: (!) 142/79 (!) 149/78  Pulse: (!) 58 (!) 57  Resp: 18 18  Temp: 97.7 F (36.5 C) 97.8 F (36.6 C)  SpO2: 96% 95%    General: NAD Cardiovascular: RRR no murmurs rubs or gallops.  No JVD.  No lower extremity edema. Respiratory: Clear to auscultation bilaterally.  No wheezes, no crackles,  no rhonchi.  Fair air movement.  Speaking in full sentences. GI: Abdomen is soft, nondistended, nontender to palpation.  Postop dressing in place.  Discharge Instructions   Discharge Instructions     Diet Carb Modified   Complete by: As directed    Discharge wound care:   Complete by: As directed    Wound care  Every shift      Comments: BID wet to dry dressing changes to abdominal wall wound   Increase activity slowly   Complete by: As directed       Allergies as of 07/07/2023       Reactions   Sesame Oil Hives, Shortness Of Breath   Clonazepam Hives, Other (See Comments)   Tolerates Ativan   Tape Other (See Comments)   White surgical tape caused some blisters and skin was "on fire for 6 months"   Percocet [oxycodone-acetaminophen] Rash   Topamax [topiramate] Other (See Comments)   Cognitive decline        Medication List     STOP taking these medications    albuterol 108 (90 Base) MCG/ACT inhaler Commonly known as: VENTOLIN HFA   furosemide 20 MG tablet Commonly known as: LASIX   ondansetron 4 MG tablet Commonly known as: ZOFRAN       TAKE these medications    AMBULATORY NON FORMULARY MEDICATION Continue CPAP at current settings.  Please provide new supplies. Allia Wiltsey Caul phone number [phone]   AMBULATORY NON FORMULARY MEDICATION Compression stockings.  Vive size LGM Leg swelling. Disp 1 pair 99 refil.   AMBULATORY  NON FORMULARY MEDICATION Please provide:  12 inch grab bar x2 Trapeze bar to assist with getting out of bed.  Dx: R53.81, Z90.49, K83.9   amLODipine 5 MG tablet Commonly known as: NORVASC Take 1 tablet (5 mg total) by mouth daily.   amoxicillin-clavulanate 875-125 MG tablet Commonly known as: AUGMENTIN Take 1 tablet by mouth 2 (two) times daily for 10 days.   anastrozole 1 MG tablet Commonly known as: ARIMIDEX Take 1 tablet (1 mg total) by mouth every Monday, Wednesday, and Friday.   APPLE CIDER VINEGAR PO Take 1  tablet by mouth See admin instructions. Chew 1 gummie by mouth once a day   atorvastatin 40 MG tablet Commonly known as: LIPITOR TAKE 1 TABLET EVERY DAY   Azelaic Acid 15 % gel Apply 1 application topically 2 (two) times daily. What changed:  when to take this reasons to take this   busPIRone 5 MG tablet Commonly known as: BUSPAR Take 1 tablet (5 mg total) by mouth 3 (three) times daily as needed. May take an optional 4th dose in a day if needed. What changed:  how much to take when to take this additional instructions   Carbidopa-Levodopa ER 25-100 MG tablet controlled release Commonly known as: SINEMET CR Take 2 tablets by mouth See admin instructions. Take 2 tablets by mouth upon awakening, midday, and at bedtime   Flex Therapy Misc by Does not apply route.   fluticasone 50 MCG/ACT nasal spray Commonly known as: FLONASE Place 1 spray into both nostrils at bedtime as needed for allergies or rhinitis (or nasal congestion).   glipiZIDE 5 MG tablet Commonly known as: GLUCOTROL TAKE 1 TABLET TWICE DAILY BEFORE MEALS What changed: See the new instructions.   HYDROmorphone 2 MG tablet Commonly known as: Dilaudid Take 1 tablet (2 mg total) by mouth every 6 (six) hours as needed for severe pain.   Linzess 145 MCG Caps capsule Generic drug: linaclotide Take 145 mcg by mouth daily as needed (for constipation).   loratadine 10 MG tablet Commonly known as: CLARITIN Take 10 mg by mouth daily as needed for allergies.   losartan 50 MG tablet Commonly known as: COZAAR TAKE 1 TABLET EVERY DAY What changed: when to take this   metFORMIN 500 MG 24 hr tablet Commonly known as: GLUCOPHAGE-XR Take 1 tablet (500 mg total) by mouth 2 (two) times daily with a meal. What changed:  when to take this additional instructions   mometasone 0.1 % cream Commonly known as: ELOCON Apply 1 Application topically daily. What changed:  when to take this additional instructions    multivitamin with minerals Tabs tablet Take 1 tablet by mouth daily with breakfast.   NON FORMULARY Take 2 capsules by mouth See admin instructions. Yoli Alkalete - Calcium and Magnesium Supplement capsules- Take 2 capsules by mouth once a day   polyethylene glycol 17 g packet Commonly known as: MIRALAX / GLYCOLAX Take 17 g by mouth daily. Start taking on: July 08, 2023   PRESCRIPTION MEDICATION CPAP- At bedtime   PROBIOTIC PO Take 2 capsules by mouth daily.   rOPINIRole 2 MG tablet Commonly known as: REQUIP TAKE 1 TABLET THREE TIMES DAILY   senna-docusate 8.6-50 MG tablet Commonly known as: Senokot-S Take 1 tablet by mouth at bedtime.   tamsulosin 0.4 MG Caps capsule Commonly known as: FLOMAX Take 2 capsules (0.8 mg total) by mouth daily. What changed: how much to take   Testosterone Cypionate 200 MG/ML Soln Inject 200 mg into the  muscle every 14 (fourteen) days.   traMADol 50 MG tablet Commonly known as: ULTRAM Take 50 mg by mouth every 6 (six) hours as needed (for neuropathic pain).   triamcinolone cream 0.1 % Commonly known as: KENALOG Apply 1 Application topically daily as needed (flares).   Vitamin D3 50 MCG (2000 UT) Tabs Take 2,000 Units by mouth every evening.               Discharge Care Instructions  (From admission, onward)           Start     Ordered   07/07/23 0000  Discharge wound care:       Comments: Wound care  Every shift      Comments: BID wet to dry dressing changes to abdominal wall wound   07/07/23 1315           Allergies  Allergen Reactions   Sesame Oil Hives and Shortness Of Breath   Clonazepam Hives and Other (See Comments)    Tolerates Ativan   Tape Other (See Comments)    White surgical tape caused some blisters and skin was "on fire for 6 months"   Percocet [Oxycodone-Acetaminophen] Rash   Topamax [Topiramate] Other (See Comments)    Cognitive decline    Follow-up Information     Andria Meuse, MD. Go on 07/30/2023.   Specialties: General Surgery, Colon and Rectal Surgery Why: at 11:00 AM. for wound re-check and follow up with the surgeon. please arrive 15-20 minutes early to your appointment. Contact information: [address] [place] [address] [phone]                  The results of significant diagnostics from this hospitalization (including imaging, microbiology, ancillary and laboratory) are listed below for reference.    Significant Diagnostic Studies: CT ABDOMEN W CONTRAST  Result Date: 07/06/2023 CLINICAL DATA:  Abdominal pain and bloating. Evaluate for enteric fistula.Status post subtotal cholecystectomy 08/29/2022 with resection of the anterior wall the gallbladder in full duration of the posterior wall. EXAM: CT ABDOMEN WITH CONTRAST TECHNIQUE: Multidetector CT imaging of the abdomen was performed using the standard protocol following bolus administration of intravenous contrast. RADIATION DOSE REDUCTION: This exam was performed according to the departmental dose-optimization program which includes automated exposure control, adjustment of the mA and/or kV according to patient size and/or use of iterative reconstruction technique. CONTRAST:  OMNIPAQUE IOHEXOL 300 MG/ML  SOLN COMPARISON:  07/02/2023 FINDINGS: Lower chest: No acute findings. Hepatobiliary: No suspicious focal abnormality within the liver parenchyma. Tiny calcification again identified in the region of the expected neck of the gallbladder in this may be residual gallstone or dropped gallstone. Similar appearance of an irregular ill-defined 6.8 x 3.4 cm collection of gas in soft tissue attenuation in the gallbladder fossa. As before, this collection is continuous with an tracks antro laterally and inferiorly into the rectus sheath. The 5.2 x 3.8 cm rectus sheath rim enhancing collection of fluid and gas seen previously has been debrided in the interval with an open  wound at this location today. No intrahepatic or extrahepatic biliary dilation. Pancreas: No focal mass lesion. No dilatation of the main duct. No intraparenchymal cyst. No peripancreatic edema. Spleen: No splenomegaly. No suspicious focal mass lesion. Adrenals/Urinary Tract: No adrenal nodule or mass. Punctate nonobstructing stones are seen in the left kidney. Right kidney unremarkable. Stomach/Bowel: Stomach is moderately distended with gas and fluid. As on the prior study, there is a loss  of fat plane between the first segment of the duodenum and the gallbladder fossa collection, but there is no duodenal wall thickening through this region. Coronal imaging suggests that there is some tethering of the proximal duodenum into the gallbladder fossa collection (image 35/4) which may simply reflect scar. Fistulous tract is a possibility although there is no gas in this linear band of soft tissue. There is similar tethering of the hepatic flexure with a bandlike configuration of soft tissue between the gallbladder fossa collection and the cranial wall of the hepatic flexure visible on coronal 32/4. There is no gas in this soft tissue collection. There is no contrast in the gallbladder fossa collection to suggest extravasation from either the duodenum or the hepatic flexure. There is no contrast material in the stomach or duodenum as it is passed into the distal small bowel and colon by the time of scanning. Vascular/Lymphatic: No abdominal aortic aneurysm. No abdominal lymphadenopathy. Other: No intraperitoneal free fluid. Multiple small calcifications are seen along the medial surface of the inferior right liver towards more since pouch, possibly representing dropped gallstones. Musculoskeletal: No worrisome lytic or sclerotic osseous abnormality. IMPRESSION: 1. Similar appearance of an irregular ill-defined 6.8 x 3.4 cm collection of gas and soft tissue attenuation in the gallbladder fossa. As before, this collection  is continuous with and tracks antero laterally and inferiorly into the rectus sheath. The 5.2 x 3.8 cm rectus sheath rim enhancing collection of fluid and gas seen previously has been debrided in the interval with an open wound at this location today. 2. There is a loss of fat plane between the first segment of the duodenum and the gallbladder fossa collection, but there is no duodenal wall thickening through this region. Coronal imaging suggests that there is some tethering of the proximal duodenum into the gallbladder fossa collection which may simply reflect scar. Fistulous tract is a possibility although there is no gas in this linear band of soft tissue. There is similar tethering of the hepatic flexure with a bandlike configuration of soft tissue between the gallbladder fossa collection the cranial wall of the hepatic flexure visible on coronal 32/4. There is no gas in this soft tissue collection. There is no contrast in the gallbladder fossa collection to suggest extravasation from either the duodenum or the hepatic flexure although contrast material has passed beyond the duodenum by the time of scanning. 3. Possible dropped stones in the region of Morrison's pouch. Stable. 4. Punctate nonobstructing left renal stones. Electronically Signed   By: Kennith Center M.D.   On: 07/06/2023 17:43   NM HEPATOBILIARY LEAK (POST-SURGICAL)  Result Date: 07/04/2023 CLINICAL DATA:  Subtotal colectomy 04/05/2023. Elevated bilirubin. Evaluate for biliary leak. EXAM: NUCLEAR MEDICINE HEPATOBILIARY IMAGING TECHNIQUE: Sequential images of the abdomen were obtained out to 60 minutes following intravenous administration of radiopharmaceutical. RADIOPHARMACEUTICALS:  4.8 mCi Tc-32m  Choletec IV COMPARISON:  CT 07/02/2023 FINDINGS: Prompt uptake of radiotracer in the liver. Counts are evident within the common bile duct and small bowel 20 minutes. No evidence of extraluminal excreted radiotracer to suggest bile leak. Reflux  into the stomach is noted. IMPRESSION: No evidence of biliary leak. Patent common bile duct. Electronically Signed   By: Genevive Bi M.D.   On: 07/04/2023 11:17   CT ABDOMEN PELVIS W CONTRAST  Result Date: 07/02/2023 CLINICAL DATA:  Abdominal pain, postop, right-sided pain. Lump in right abdomen. Patient post subtotal cholecystectomy in August. EXAM: CT ABDOMEN AND PELVIS WITH CONTRAST TECHNIQUE: Multidetector CT imaging of the  abdomen and pelvis was performed using the standard protocol following bolus administration of intravenous contrast. RADIATION DOSE REDUCTION: This exam was performed according to the departmental dose-optimization program which includes automated exposure control, adjustment of the mA and/or kV according to patient size and/or use of iterative reconstruction technique. CONTRAST:  OMNIPAQUE IOHEXOL 300 MG/ML  SOLN COMPARISON:  CT 05/21/2023, CT 03/17/2023 FINDINGS: Lower chest: The heart is upper normal in size. No acute airspace disease. Hepatobiliary: Patient is post subtotal cholecystectomy. Decreased stone burden in the gallbladder fossa with a residual calcified stone. Heterogeneous soft tissue density in the gallbladder fossa with foci of gas, similar in size from prior although the gas is increasing. This is contiguous with a soft tissue tract at site of prior drain extending anteriorly in the right abdomen extending subjacent and within the abdominal wall musculature. Overall increase in size and complexity from prior exam. This is contiguous with a heterogeneous collection in the right anterior abdominal wall musculature measuring 5.2 x 3.7 cm series 2, image 35, new from prior exam. No common bile duct dilatation or choledocholithiasis. No intrahepatic biliary ductal dilatation. No evidence of focal hepatic lesion. Hepatic steatosis again seen. Pancreas: No ductal dilatation or inflammation. Spleen: Normal in size without focal abnormality. Adrenals/Urinary Tract: No  adrenal nodule. Punctate stone in the left kidney. No hydronephrosis or renal inflammation. Decompressed ureters. Partially distended urinary bladder. Mild bladder wall thickening which may be chronic. Stomach/Bowel: There is loss of fat plane between the distal stomach/proximal duodenum and the gallbladder fossa, series 2, image 24. There is also loss of fat plane between the tract in the hepatic flexure of the colon, coronal series 7, image 47. There is no bowel obstruction. Small to moderate volume of stool in the colon. The appendix is normal. Vascular/Lymphatic: Aortic atherosclerosis. No aortic aneurysm. Prominent portal caval node measuring 9 mm series 2, image 28. Reproductive: Enlarged prostate gland causing mass effect on the bladder base. Other: Tract in the right abdomen at site of prior drain as described above. Tract is increased in size with surrounding inflammation and internal foci of gas. New fluid collection in the right anterior abdominal wall musculature as described. No frank free fluid or ascites. Musculoskeletal: Chronic right L5 pars defect. Again seen degenerative change in the spine. No new osseous findings. IMPRESSION: 1. Patient is post subtotal cholecystectomy. Heterogeneous soft tissue density in the gallbladder fossa with foci of gas, similar in size from prior although the internal gas is increasing. This is contiguous with a soft tissue tract at site of prior drain extending anteriorly in the right abdomen extending subjacent and within the abdominal wall musculature. Overall increase in size and complexity from prior exam. This is contiguous with a heterogeneous collection in the right anterior abdominal wall musculature measuring 5.2 x 3.7 cm, new from prior exam. Findings are concerning for abscess. 2. Loss of fat plane between the distal stomach/proximal duodenum and the gallbladder fossa. There is also loss of fat plane between the drain tract and the hepatic flexure of the  colon. Findings raise concern for enteric fistula. Recommend surgical follow-up. 3. Decreased stone burden in the gallbladder fossa with a residual calcified stone. 4. Hepatic steatosis. 5. Punctate left renal stone. 6. Enlarged prostate gland causing mass effect on the bladder base. Mild bladder wall thickening which may be chronic. Aortic Atherosclerosis (ICD10-I70.0). These results were called by telephone at the time of interpretation on 07/02/2023 at 5:42 pm to provider Alvino Blood , who verbally acknowledged  these results. Electronically Signed   By: Narda Rutherford M.D.   On: 07/02/2023 17:42    Microbiology: Recent Results (from the past 240 hour(s))  Aerobic/Anaerobic Culture w Gram Stain (surgical/deep wound)     Status: None (Preliminary result)   Collection Time: 07/03/23  3:31 PM   Specimen: Abscess  Result Value Ref Range Status   Specimen Description   Final    ABSCESS Performed at [facility], [address] [place], [place] [address]    Special Requests   Final    NONE Performed at [facility], [address] [place], [place] [address]    Gram Stain   Final    ABUNDANT WBC PRESENT, PREDOMINANTLY PMN ABUNDANT GRAM POSITIVE COCCI MODERATE GRAM POSITIVE RODS FEW GRAM NEGATIVE RODS RARE GRAM NEGATIVE COCCI    Culture   Final    ABUNDANT KLEBSIELLA OXYTOCA ABUNDANT STREPTOCOCCUS ANGINOSIS ABUNDANT STREPTOCOCCUS CONSTELLATUS Beta hemolytic streptococci are predictably susceptible to penicillin and other beta lactams. Susceptibility testing not routinely performed. SUSCEPTIBILITIES TO FOLLOW STREPTOCOCCUS ANGINOSIS HOLDING FOR POSSIBLE ANAEROBE Performed at [facility], [address], [place], [place] [address]    Report Status PENDING  Incomplete   Organism ID, Bacteria KLEBSIELLA OXYTOCA  Final      Susceptibility   Klebsiella oxytoca - MIC*    AMPICILLIN RESISTANT Resistant     CEFEPIME <=0.12 SENSITIVE  Sensitive     CEFTAZIDIME <=1 SENSITIVE Sensitive     CEFTRIAXONE <=0.25 SENSITIVE Sensitive     CIPROFLOXACIN <=0.25 SENSITIVE Sensitive     GENTAMICIN <=1 SENSITIVE Sensitive     IMIPENEM <=0.25 SENSITIVE Sensitive     TRIMETH/SULFA <=20 SENSITIVE Sensitive     AMPICILLIN/SULBACTAM 4 SENSITIVE Sensitive     PIP/TAZO <=4 SENSITIVE Sensitive ug/mL    * ABUNDANT KLEBSIELLA OXYTOCA     Labs: Basic Metabolic Panel: Recent Labs  Lab 07/03/23 0446 07/04/23 0506 07/04/23 0859 07/05/23 0533 07/06/23 0438 07/07/23 0411  NA 136 137  --  135 134* 140  K 4.3 5.3* 4.8 4.0 3.5 3.8  CL 99 100  --  99 101 104  CO2 26 30  --  29 27 26   GLUCOSE 92 175*  --  140* 138* 113*  BUN 13 15  --  19 16 13   CREATININE 0.96 1.01  --  0.94 0.93 0.90  CALCIUM 9.0 9.2  --  8.8* 8.7* 9.2  MG  --   --   --  2.2 2.2  --    Liver Function Tests: Recent Labs  Lab 07/02/23 1550 07/05/23 0533  AST 14* 14*  ALT 5 <5  ALKPHOS 54 53  BILITOT 1.3* 0.6  PROT 7.7 6.9  ALBUMIN 3.2* 2.6*   Recent Labs  Lab 07/02/23 1550  LIPASE 22   No results for input(s): "AMMONIA" in the last 168 hours. CBC: Recent Labs  Lab 07/02/23 1550 07/03/23 0446 07/04/23 0506 07/05/23 0533 07/06/23 0438 07/07/23 0411  WBC 10.7* 10.5 8.5 10.3 8.7 9.2  NEUTROABS 8.8*  --  7.3  --   --  5.7  HGB 13.6 12.7* 13.0 12.6* 13.2 13.5  HCT 44.3 41.2 41.1 41.3 44.4 43.6  MCV 84.9 84.9 84.2 86.8 87.4 85.8  PLT 381 363 337 384 406* 380   Cardiac Enzymes: No results for input(s): "CKTOTAL", "CKMB", "CKMBINDEX", "TROPONINI" in the last 168 hours. BNP: BNP (last 3 results) No results for input(s): "BNP" in the last 8760 hours.  ProBNP (last  3 results) No results for input(s): "PROBNP" in the last 8760 hours.  CBG: Recent Labs  Lab 07/02/23 2049 07/03/23 1705 07/05/23 0809 07/05/23 1125 07/05/23 1628  GLUCAP 76 135* 102* 119* 158*       Signed:  Ramiro Harvest MD.  [facility] 07/07/2023, 1:16 PM

## 2023-07-07 NOTE — Plan of Care (Signed)
  Problem: Fluid Volume: Goal: Ability to maintain a balanced intake and output will improve Outcome: Progressing   Problem: Health Behavior/Discharge Planning: Goal: Ability to manage health-related needs will improve Outcome: Progressing   Problem: Clinical Measurements: Goal: Will remain free from infection Outcome: Progressing

## 2023-07-07 NOTE — TOC Transition Note (Signed)
Transition of Care [facility]) - CM/SW Discharge Note   Patient Details  Name: Jonathan Yu MRN: [id number] Date of Birth: [birth year]  Transition of Care [facility]) CM/SW Contact:  Howell Rucks, RN Phone Number: 07/07/2023, 2:50 PM   Clinical Narrative:  LATE ENTRY:   DC to home with [facility] for wound care. Referral to [facility], rep-Cory, accepted for [facility] RN, added to AVS post discharge.           Patient Goals and CMS Choice      Discharge Placement                         Discharge Plan and Services Additional resources added to the After Visit Summary for                                       Social Determinants of Health (SDOH) Interventions SDOH Screenings   Food Insecurity: No Food Insecurity (07/02/2023)  Housing: Patient Declined (07/02/2023)  Transportation Needs: No Transportation Needs (07/02/2023)  Utilities: Not At Risk (07/02/2023)  Alcohol Screen: Low Risk  (05/06/2023)  Depression (PHQ2-9): Medium Risk (05/06/2023)  Financial Resource Strain: Low Risk  (05/05/2023)  Physical Activity: Inactive (05/05/2023)  Social Connections: Moderately Isolated (05/06/2023)  Stress: Stress Concern Present (05/05/2023)  Tobacco Use: High Risk (07/03/2023)  Health Literacy: Adequate Health Literacy (05/06/2023)     Readmission Risk Interventions    03/19/2023   11:34 AM  Readmission Risk Prevention Plan  Post Dischage Appt Complete  Medication Screening Complete  Transportation Screening Complete

## 2023-07-07 NOTE — Discharge Instructions (Signed)
WOUND CARE: - dressing to be changed 1-2x daily - supplies: saline, 4x4 gauze, scissors, ABD pads, tape  - remove dressing and all packing carefully - clean edges of skin around the wound with water/gauze, making sure there is no tape debris or leakage left on skin that could cause skin irritation or breakdown. - dampen a clean gauze with saline and pack wound from wound base to skin level, making sure to take note of any possible areas of wound tracking, tunneling and packing appropriately. Wound can be packed loosely. Trim gauze to size if a whole gauze is not required. - cover wound with a dry ABD pad and secure with tape.  - apply any skin protectant/powder recommended by clinician to protect skin/skin folds. - change dressing as needed if leakage occurs, wound gets contaminated, or patient requests to shower. - patient may shower daily with wound open and following the shower the wound should be dried and a clean dressing placed.

## 2023-07-07 NOTE — Progress Notes (Signed)
Discharge instructions discussed with patient and family, verbalized agreement and understanding 

## 2023-07-07 NOTE — Progress Notes (Signed)
4 Days Post-Op   Subjective/Chief Complaint: Feels better today. Denies significant abdominal pain. He has tolerating PO and having bowel function.    Objective: Vital signs in last 24 hours: Temp:  [97.5 F (36.4 C)-97.8 F (36.6 C)] 97.8 F (36.6 C) (11/25 0535) Pulse Rate:  [57-67] 57 (11/25 0535) Resp:  [16-18] 18 (11/25 0535) BP: (134-149)/(78-82) 149/78 (11/25 0535) SpO2:  [95 %-96 %] 95 % (11/25 0535) Last BM Date : 07/05/23  Intake/Output from previous day: 11/24 0701 - 11/25 0700 In: 770.8 [P.O.:470; IV Piggyback:300.8] Out: 1325 [Urine:1325] Intake/Output this shift: Total I/O In: 240 [P.O.:240] Out: 350 [Urine:350]  Alert well-appearing Unlabored respirations Abdomen soft, improved tenderness and right upper quadrant.  RUQ/flank with two small wounds s/p I&D - wound base clean and pink. No cellulitis. Drainage is non-bilious, serosanguinous Lab Results:  Recent Labs    07/06/23 0438 07/07/23 0411  WBC 8.7 9.2  HGB 13.2 13.5  HCT 44.4 43.6  PLT 406* 380   BMET Recent Labs    07/06/23 0438 07/07/23 0411  NA 134* 140  K 3.5 3.8  CL 101 104  CO2 27 26  GLUCOSE 138* 113*  BUN 16 13  CREATININE 0.93 0.90  CALCIUM 8.7* 9.2   PT/INR No results for input(s): "LABPROT", "INR" in the last 72 hours.  ABG No results for input(s): "PHART", "HCO3" in the last 72 hours.  Invalid input(s): "PCO2", "PO2"  Studies/Results: CT ABDOMEN W CONTRAST  Result Date: 07/06/2023 CLINICAL DATA:  Abdominal pain and bloating. Evaluate for enteric fistula.Status post subtotal cholecystectomy 08/29/2022 with resection of the anterior wall the gallbladder in full duration of the posterior wall. EXAM: CT ABDOMEN WITH CONTRAST TECHNIQUE: Multidetector CT imaging of the abdomen was performed using the standard protocol following bolus administration of intravenous contrast. RADIATION DOSE REDUCTION: This exam was performed according to the departmental dose-optimization program  which includes automated exposure control, adjustment of the mA and/or kV according to patient size and/or use of iterative reconstruction technique. CONTRAST:  OMNIPAQUE IOHEXOL 300 MG/ML  SOLN COMPARISON:  07/02/2023 FINDINGS: Lower chest: No acute findings. Hepatobiliary: No suspicious focal abnormality within the liver parenchyma. Tiny calcification again identified in the region of the expected neck of the gallbladder in this may be residual gallstone or dropped gallstone. Similar appearance of an irregular ill-defined 6.8 x 3.4 cm collection of gas in soft tissue attenuation in the gallbladder fossa. As before, this collection is continuous with an tracks antro laterally and inferiorly into the rectus sheath. The 5.2 x 3.8 cm rectus sheath rim enhancing collection of fluid and gas seen previously has been debrided in the interval with an open wound at this location today. No intrahepatic or extrahepatic biliary dilation. Pancreas: No focal mass lesion. No dilatation of the main duct. No intraparenchymal cyst. No peripancreatic edema. Spleen: No splenomegaly. No suspicious focal mass lesion. Adrenals/Urinary Tract: No adrenal nodule or mass. Punctate nonobstructing stones are seen in the left kidney. Right kidney unremarkable. Stomach/Bowel: Stomach is moderately distended with gas and fluid. As on the prior study, there is a loss of fat plane between the first segment of the duodenum and the gallbladder fossa collection, but there is no duodenal wall thickening through this region. Coronal imaging suggests that there is some tethering of the proximal duodenum into the gallbladder fossa collection (image 35/4) which may simply reflect scar. Fistulous tract is a possibility although there is no gas in this linear band of soft tissue. There is similar tethering  of the hepatic flexure with a bandlike configuration of soft tissue between the gallbladder fossa collection and the cranial wall of the hepatic  flexure visible on coronal 32/4. There is no gas in this soft tissue collection. There is no contrast in the gallbladder fossa collection to suggest extravasation from either the duodenum or the hepatic flexure. There is no contrast material in the stomach or duodenum as it is passed into the distal small bowel and colon by the time of scanning. Vascular/Lymphatic: No abdominal aortic aneurysm. No abdominal lymphadenopathy. Other: No intraperitoneal free fluid. Multiple small calcifications are seen along the medial surface of the inferior right liver towards more since pouch, possibly representing dropped gallstones. Musculoskeletal: No worrisome lytic or sclerotic osseous abnormality. IMPRESSION: 1. Similar appearance of an irregular ill-defined 6.8 x 3.4 cm collection of gas and soft tissue attenuation in the gallbladder fossa. As before, this collection is continuous with and tracks antero laterally and inferiorly into the rectus sheath. The 5.2 x 3.8 cm rectus sheath rim enhancing collection of fluid and gas seen previously has been debrided in the interval with an open wound at this location today. 2. There is a loss of fat plane between the first segment of the duodenum and the gallbladder fossa collection, but there is no duodenal wall thickening through this region. Coronal imaging suggests that there is some tethering of the proximal duodenum into the gallbladder fossa collection which may simply reflect scar. Fistulous tract is a possibility although there is no gas in this linear band of soft tissue. There is similar tethering of the hepatic flexure with a bandlike configuration of soft tissue between the gallbladder fossa collection the cranial wall of the hepatic flexure visible on coronal 32/4. There is no gas in this soft tissue collection. There is no contrast in the gallbladder fossa collection to suggest extravasation from either the duodenum or the hepatic flexure although contrast material has  passed beyond the duodenum by the time of scanning. 3. Possible dropped stones in the region of Morrison's pouch. Stable. 4. Punctate nonobstructing left renal stones. Electronically Signed   By: Kennith Center M.D.   On: 07/06/2023 17:43    Anti-infectives: Anti-infectives (From admission, onward)    Start     Dose/Rate Route Frequency Ordered Stop   07/06/23 2200  Ampicillin-Sulbactam (UNASYN) 3 g in sodium chloride 0.9 % 100 mL IVPB        3 g 200 mL/hr over 30 Minutes Intravenous Every 6 hours 07/06/23 1728     07/04/23 2000  vancomycin (VANCOREADY) IVPB 1500 mg/300 mL  Status:  Discontinued        1,500 mg 150 mL/hr over 120 Minutes Intravenous Every 24 hours 07/03/23 1817 07/06/23 1731   07/03/23 2100  vancomycin (VANCOCIN) IVPB 1000 mg/200 mL premix       Placed in "Followed by" Linked Group   1,000 mg 200 mL/hr over 60 Minutes Intravenous  Once 07/03/23 1817 07/04/23 0736   07/03/23 2000  vancomycin (VANCOCIN) IVPB 1000 mg/200 mL premix       Placed in "Followed by" Linked Group   1,000 mg 200 mL/hr over 60 Minutes Intravenous  Once 07/03/23 1817 07/04/23 0736   07/03/23 0300  piperacillin-tazobactam (ZOSYN) IVPB 3.375 g  Status:  Discontinued        3.375 g 12.5 mL/hr over 240 Minutes Intravenous Every 8 hours 07/02/23 1900 07/06/23 1728   07/02/23 1800  piperacillin-tazobactam (ZOSYN) IVPB 3.375 g  3.375 g 12.5 mL/hr over 240 Minutes Intravenous  Once 07/02/23 1745 07/03/23 0700   07/02/23 1745  piperacillin-tazobactam (ZOSYN) IVPB 4.5 g  Status:  Discontinued        4.5 g 200 mL/hr over 30 Minutes Intravenous  Once 07/02/23 1742 07/02/23 1745       Assessment/Plan:  Abdominal wall abscess-status post I&D 11/21.  Output was purulent, nonbilious, nonfeculent.  Abscess is intramuscular and below the muscle; cultures w/ klebsiella >> narrowed to unasyn per ID recs S/p subtotal cholecystectomy 8/2 with expected post op bile leak.  Bile leak resolved with stenting and  stent has subsequently been removed.  Retained stones HIDA 11/22 negative for recurrent bile leak CT 11/24 negative for enterocutaneous fistula/colocutaneous fistula. Still small GB fossa collection/retained stones. Abd wall collection resolved s/p I&D.   Stable for discharge from [facility] standpoint. Recommend 2 weeks of PO abx. He has family to help with wound care but also requests home health. I have scheduled him for follow up with Dr. Cliffton Asters in December.    LOS: 5 days    Jonathan Yu 07/07/2023

## 2023-07-08 ENCOUNTER — Telehealth: Payer: Self-pay

## 2023-07-08 NOTE — Transitions of Care (Post Inpatient/ED Visit) (Signed)
07/08/2023  Name: Jonathan Yu MRN: [id number] DOB: August 04, 1951  Today's TOC FU Call Status: Today's TOC FU Call Status:: Successful TOC FU Call Completed TOC FU Call Complete Date: 07/08/23 Patient's Name and Date of Birth confirmed.  Transition Care Management Follow-up Telephone Call Date of Discharge: 07/07/23 Discharge Facility: [facility]) Type of Discharge: Inpatient Admission Primary Inpatient Discharge Diagnosis:: Right Anterior Wall Abdominal Abscess How have you been since you were released from the hospital?: Better Any questions or concerns?: Yes Patient Questions/Concerns:: Patient has not heard from RN at [facility] to come and assist him with wet to dry dressing Patient Questions/Concerns Addressed: Other: Jonathan Yu and spoke with Jonathan Yu [phone]) who informed me that Jonathan Yu is the RN who will be calling patient this afternoon.  At patient's request, sent him a secure message via Epic with the information on the [facility] RN)  Items Reviewed: Did you receive and understand the discharge instructions provided?: Yes Medications obtained,verified, and reconciled?: Yes (Medications Reviewed) Any new allergies since your discharge?: No Dietary orders reviewed?: Yes Type of Diet Ordered:: Carbohydrate Modified Do you have support at home?: Yes People in Home: child(ren), adult Name of Support/Comfort Primary Source: Jonathan Yu (son) and other children assist  Medications Reviewed Today: Medications Reviewed Today     Reviewed by Jonathan Gross, RN (Case Manager) on 07/08/23 at 1142  Med List Status: <None>   Medication Order Taking? Sig Documenting Provider Last Dose Status Informant  AMBULATORY NON FORMULARY MEDICATION [id number] Yes Continue CPAP at current settings.  Please provide new supplies. Jonathan Yu phone number [phone] Jonathan Bong, MD Taking Active Self  Jonathan Yu MEDICATION [id number] Yes Compression stockings.  Jonathan  size Yu Leg swelling. Disp 1 pair 99 refil. Jonathan Bong, MD Taking Active   AMBULATORY Clent Demark MEDICATION [id number] Yes Please provide:  12 inch grab bar x2 Trapeze bar to assist with getting out of bed.  Dx: R53.81, Z90.49, K83.9 Jonathan Coombe, DO Taking Active   amLODipine (NORVASC) 5 MG tablet [id number] Yes Take 1 tablet (5 mg total) by mouth daily. Jonathan Coombe, DO Taking Active Self  amoxicillin-clavulanate (AUGMENTIN) 875-125 MG tablet [id number] Yes Take 1 tablet by mouth 2 (two) times daily for 10 days. Jonathan Munson, MD Taking Active   anastrozole (ARIMIDEX) 1 MG tablet [id number] Yes Take 1 tablet (1 mg total) by mouth every Monday, Wednesday, and Friday. Jonathan Coombe, DO Taking Active Self  APPLE CIDER VINEGAR PO [id number] Yes Take 1 tablet by mouth See admin instructions. Chew 1 gummie by mouth once a day [provider] Taking Active Self  atorvastatin (LIPITOR) 40 MG tablet [id number] Yes TAKE 1 TABLET EVERY DAY  Patient taking differently: Take 40 mg by mouth daily.   Jonathan Coombe, DO Taking Active Self  Azelaic Acid 15 % cream [id number] Yes Apply 1 application topically 2 (two) times daily.  Patient taking differently: Apply 1 application  topically 2 (two) times daily as needed (skin flares- affected areas).   Jonathan Bong, MD Taking Active Self  busPIRone (BUSPAR) 5 MG tablet [id number] Yes Take 1 tablet (5 mg total) by mouth 3 (three) times daily as needed. May take an optional 4th dose in a day if needed.  Patient taking differently: Take 10 mg by mouth in the morning, at noon, and at bedtime.   Huston Foley, MD Taking Active Self           Med Note Antony Madura, Melody Comas Jul 02, 2023  4:24 PM) The patient has not yet started taking the 7.5 mg strength  Carbidopa-Levodopa ER (SINEMET CR) 25-100 MG tablet controlled release [id number] Yes Take 2 tablets by mouth See admin instructions. Take 2 tablets by mouth upon awakening, midday, and at bedtime [provider] Taking Active Self  Cholecalciferol (VITAMIN D3) 50 MCG (2000 UT) TABS [id number] Yes Take 2,000 Units by mouth every evening. [provider] Taking Active Self  fluticasone (FLONASE) 50 MCG/ACT nasal spray [id number] Yes Place 1 spray into both nostrils at bedtime as needed for allergies or rhinitis (or nasal congestion). [provider] Taking Active Self           Med Note Antony Madura, Arn Medal   Wed Jul 02, 2023  4:11 PM)    glipiZIDE (GLUCOTROL) 5 MG tablet [id number] Yes TAKE 1 TABLET TWICE DAILY BEFORE MEALS  Patient taking differently: Take 5 mg by mouth See admin instructions. Take 5 mg by mouth upon awakening and midday   Jonathan Coombe, DO Taking Active Self  HYDROmorphone (DILAUDID) 2 MG tablet [id number] Yes Take 1 tablet (2 mg total) by mouth every 6 (six) hours as needed for severe pain. Ernie Avena, MD Taking Active Self  LINZESS 145 MCG CAPS capsule [id number] Yes Take 145 mcg by mouth daily as needed (for constipation). [provider] Taking Active Self  loratadine (CLARITIN) 10 MG tablet [id number] Yes Take 10 mg by mouth daily as needed for allergies. [provider] Taking Active Self  losartan (COZAAR) 50 MG tablet [id number] Yes TAKE 1 TABLET EVERY DAY  Patient taking differently: Take 50 mg by mouth daily with breakfast.   Jonathan Coombe, DO Taking Active Self  metFORMIN (GLUCOPHAGE-XR) 500 MG 24 hr tablet [id number] Yes Take 1 tablet (500 mg total) by mouth 2 (two) times daily with a meal.  Patient taking differently: Take 500 mg by mouth See admin instructions. Take 500 mg by mouth in the morning and midday   Jonathan Coombe, DO Taking Active Self  Misc. Devices (FLEX THERAPY) MISC [id number] Yes by Does not apply route. [provider] Taking Active   mometasone (ELOCON) 0.1 % cream [id number] Yes Apply 1 Application topically daily.  Patient taking differently: Apply 1 Application topically See admin instructions. Apply to  affected areas 1-2 times a week as needed for itching/dryness- rosacea   Jonathan Coombe, DO Taking Active Self  Multiple Vitamin (MULTIVITAMIN WITH MINERALS) TABS tablet [id number] Yes Take 1 tablet by mouth daily with breakfast. [provider] Taking Active Self  NON FORMULARY [id number] Yes Take 2 capsules by mouth See admin instructions. Yoli Alkalete - Calcium and Magnesium Supplement capsules- Take 2 capsules by mouth once a day [provider] Taking Active Self  polyethylene glycol (MIRALAX / GLYCOLAX) 17 g packet [id number] Yes Take 17 g by mouth daily. Jonathan Bong, MD Taking Active   PRESCRIPTION MEDICATION [id number] Yes CPAP- At bedtime [provider] Taking Active Self  Probiotic Product (PROBIOTIC PO) [id number] Yes Take 2 capsules by mouth daily. [provider] Taking Active Self  rOPINIRole (REQUIP) 2 MG tablet [id number] Yes TAKE 1 TABLET THREE TIMES DAILY Huston Foley, MD Taking Active Self  senna-docusate (SENOKOT-S) 8.6-50 MG tablet [id number] Yes Take 1 tablet by mouth at bedtime. Jonathan Bong, MD Taking Active   tamsulosin [facility]) 0.4 MG CAPS capsule [id number] Yes Take 2 capsules (0.8 mg total) by mouth daily.  Patient taking differently: Take 0.4 mg by mouth daily.   Jonathan Coombe, DO  Taking Active Self  testosterone cypionate (DEPOTESTOSTERONE CYPIONATE) injection 200 mg [id number]   Jonathan Coombe, DO  Active   Testosterone Cypionate 200 MG/ML SOLN [id number] Yes Inject 200 mg into the muscle every 14 (fourteen) days. [provider] Taking Active Self  traMADol (ULTRAM) 50 MG tablet [id number] Yes Take 50 mg by mouth every 6 (six) hours as needed (for neuropathic pain). [provider] Taking Active Self  triamcinolone cream (KENALOG) 0.1 % [id number] Yes Apply 1 Application topically daily as needed (flares). [provider] Taking Active Self            Home Care and Equipment/Supplies: Were Home  Health Services Ordered?: Yes Name of Home Health Agency:: [facility] Has Agency set up a time to come to your home?: No EMR reviewed for Home Health Orders:  (Contacted Staci Acosta RN to call this afternoon) Any new equipment or medical supplies ordered?: No  Functional Questionnaire: Do you need assistance with bathing/showering or dressing?: Yes Do you need assistance with meal preparation?: Yes Do you need assistance with eating?: No Do you have difficulty maintaining continence: No Do you need assistance with getting out of bed/getting out of a chair/moving?: No Do you have difficulty managing or taking your medications?: No  Follow up appointments reviewed: PCP Follow-up appointment confirmed?: Yes Date of PCP follow-up appointment?: 07/17/23 Follow-up Provider: Nurse visit at PCP Specialist Hospital Follow-up appointment confirmed?: Yes Date of Specialist follow-up appointment?: 07/30/23 Follow-Up Specialty Provider:: Dr. Cliffton Asters Do you need transportation to your follow-up appointment?: No Do you understand care options if your condition(s) worsen?: Yes-patient verbalized understanding  Jonathan Gross RN, BSN, CCM RN Care Manager  Transitions of Care  [place] [facility]  [phone]

## 2023-07-09 ENCOUNTER — Other Ambulatory Visit: Payer: Self-pay | Admitting: Family Medicine

## 2023-07-09 ENCOUNTER — Telehealth: Payer: Self-pay

## 2023-07-09 DIAGNOSIS — N400 Enlarged prostate without lower urinary tract symptoms: Secondary | ICD-10-CM

## 2023-07-09 NOTE — Patient Outreach (Signed)
[facility]) Care Management  07/09/2023  Jonathan Yu Dec 18, 1950 [id number]  Incoming call from patient with concerns that [facility] RN is not able to come and check his wound until Monday, 07/14/23.  He noted that the nurse from [facility] was leaving voicemail's on the home phone which he did not get the messages, they were supppose to call his cell phone.  This Clinical research associate called [facility] and spoke with Marylene Land, who shared that they are very short staffed with the holiday and if she was able to find a slot for the patient, she would try and fit him in.  I called patient back and let him know what Marylene Land had told me and recommended he contact his surgeon at [facility] and advise them that he has not been seen by  [facility] nursing yet in case they felt he should be checked at one of their clinics or they could arrange another [facility] agency.  He agreed and was going to call his surgeon and update and he will contact this writer on 07/11/23 if he has any concerns (Thanksgiving 07/10/23 and office closed).  Jodelle Gross RN, BSN, CCM RN Care Manager  Transitions of Care  [facility]  [phone]

## 2023-07-11 DIAGNOSIS — G20A1 Parkinson's disease without dyskinesia, without mention of fluctuations: Secondary | ICD-10-CM | POA: Diagnosis not present

## 2023-07-11 DIAGNOSIS — E119 Type 2 diabetes mellitus without complications: Secondary | ICD-10-CM | POA: Diagnosis not present

## 2023-07-11 DIAGNOSIS — B957 Other staphylococcus as the cause of diseases classified elsewhere: Secondary | ICD-10-CM | POA: Diagnosis not present

## 2023-07-11 DIAGNOSIS — I10 Essential (primary) hypertension: Secondary | ICD-10-CM | POA: Diagnosis not present

## 2023-07-11 DIAGNOSIS — B961 Klebsiella pneumoniae [K. pneumoniae] as the cause of diseases classified elsewhere: Secondary | ICD-10-CM | POA: Diagnosis not present

## 2023-07-11 DIAGNOSIS — I700 Atherosclerosis of aorta: Secondary | ICD-10-CM | POA: Diagnosis not present

## 2023-07-11 DIAGNOSIS — L02211 Cutaneous abscess of abdominal wall: Secondary | ICD-10-CM | POA: Diagnosis not present

## 2023-07-11 DIAGNOSIS — E785 Hyperlipidemia, unspecified: Secondary | ICD-10-CM | POA: Diagnosis not present

## 2023-07-11 DIAGNOSIS — T8142XA Infection following a procedure, deep incisional surgical site, initial encounter: Secondary | ICD-10-CM | POA: Diagnosis not present

## 2023-07-14 DIAGNOSIS — L02211 Cutaneous abscess of abdominal wall: Secondary | ICD-10-CM | POA: Diagnosis not present

## 2023-07-14 DIAGNOSIS — B957 Other staphylococcus as the cause of diseases classified elsewhere: Secondary | ICD-10-CM | POA: Diagnosis not present

## 2023-07-14 DIAGNOSIS — I10 Essential (primary) hypertension: Secondary | ICD-10-CM | POA: Diagnosis not present

## 2023-07-14 DIAGNOSIS — T8142XA Infection following a procedure, deep incisional surgical site, initial encounter: Secondary | ICD-10-CM | POA: Diagnosis not present

## 2023-07-14 DIAGNOSIS — I700 Atherosclerosis of aorta: Secondary | ICD-10-CM | POA: Diagnosis not present

## 2023-07-14 DIAGNOSIS — B961 Klebsiella pneumoniae [K. pneumoniae] as the cause of diseases classified elsewhere: Secondary | ICD-10-CM | POA: Diagnosis not present

## 2023-07-14 DIAGNOSIS — E119 Type 2 diabetes mellitus without complications: Secondary | ICD-10-CM | POA: Diagnosis not present

## 2023-07-14 DIAGNOSIS — E785 Hyperlipidemia, unspecified: Secondary | ICD-10-CM | POA: Diagnosis not present

## 2023-07-14 DIAGNOSIS — G20A1 Parkinson's disease without dyskinesia, without mention of fluctuations: Secondary | ICD-10-CM | POA: Diagnosis not present

## 2023-07-17 ENCOUNTER — Ambulatory Visit: Payer: Medicare HMO | Admitting: Family Medicine

## 2023-07-17 VITALS — BP 102/67 | HR 74 | Temp 97.4°F | Ht 65.0 in | Wt 228.0 lb

## 2023-07-17 DIAGNOSIS — G20A1 Parkinson's disease without dyskinesia, without mention of fluctuations: Secondary | ICD-10-CM | POA: Diagnosis not present

## 2023-07-17 DIAGNOSIS — E291 Testicular hypofunction: Secondary | ICD-10-CM | POA: Diagnosis not present

## 2023-07-17 DIAGNOSIS — I10 Essential (primary) hypertension: Secondary | ICD-10-CM | POA: Diagnosis not present

## 2023-07-17 DIAGNOSIS — L02211 Cutaneous abscess of abdominal wall: Secondary | ICD-10-CM | POA: Diagnosis not present

## 2023-07-17 DIAGNOSIS — B957 Other staphylococcus as the cause of diseases classified elsewhere: Secondary | ICD-10-CM | POA: Diagnosis not present

## 2023-07-17 DIAGNOSIS — T8142XA Infection following a procedure, deep incisional surgical site, initial encounter: Secondary | ICD-10-CM | POA: Diagnosis not present

## 2023-07-17 DIAGNOSIS — E785 Hyperlipidemia, unspecified: Secondary | ICD-10-CM | POA: Diagnosis not present

## 2023-07-17 DIAGNOSIS — E119 Type 2 diabetes mellitus without complications: Secondary | ICD-10-CM | POA: Diagnosis not present

## 2023-07-17 DIAGNOSIS — I700 Atherosclerosis of aorta: Secondary | ICD-10-CM | POA: Diagnosis not present

## 2023-07-17 DIAGNOSIS — B961 Klebsiella pneumoniae [K. pneumoniae] as the cause of diseases classified elsewhere: Secondary | ICD-10-CM | POA: Diagnosis not present

## 2023-07-17 MED ORDER — TESTOSTERONE CYPIONATE 200 MG/ML IM SOLN
200.0000 mg | Freq: Once | INTRAMUSCULAR | Status: AC
  Administered 2023-07-17: 200 mg via INTRAMUSCULAR

## 2023-07-17 NOTE — Progress Notes (Signed)
Patient is here for a testosterone injection. Denies chest pain, shortness of breath, headaches and problems with medication or mood changes.   Patient tolerated injection on LUOQ well without complications. Patient advised to schedule next injection in 14 days.   

## 2023-07-17 NOTE — Progress Notes (Signed)
Medical screening examination/treatment was performed by qualified clinical staff member and as supervising physician I was immediately available for consultation/collaboration. I have reviewed documentation and agree with assessment and plan.  Ayme Short, DO  

## 2023-07-21 DIAGNOSIS — L02211 Cutaneous abscess of abdominal wall: Secondary | ICD-10-CM | POA: Diagnosis not present

## 2023-07-21 DIAGNOSIS — E119 Type 2 diabetes mellitus without complications: Secondary | ICD-10-CM | POA: Diagnosis not present

## 2023-07-21 DIAGNOSIS — G20A1 Parkinson's disease without dyskinesia, without mention of fluctuations: Secondary | ICD-10-CM | POA: Diagnosis not present

## 2023-07-21 DIAGNOSIS — E785 Hyperlipidemia, unspecified: Secondary | ICD-10-CM | POA: Diagnosis not present

## 2023-07-21 DIAGNOSIS — B961 Klebsiella pneumoniae [K. pneumoniae] as the cause of diseases classified elsewhere: Secondary | ICD-10-CM | POA: Diagnosis not present

## 2023-07-21 DIAGNOSIS — B957 Other staphylococcus as the cause of diseases classified elsewhere: Secondary | ICD-10-CM | POA: Diagnosis not present

## 2023-07-21 DIAGNOSIS — I700 Atherosclerosis of aorta: Secondary | ICD-10-CM | POA: Diagnosis not present

## 2023-07-21 DIAGNOSIS — T8142XA Infection following a procedure, deep incisional surgical site, initial encounter: Secondary | ICD-10-CM | POA: Diagnosis not present

## 2023-07-21 DIAGNOSIS — I10 Essential (primary) hypertension: Secondary | ICD-10-CM | POA: Diagnosis not present

## 2023-07-24 DIAGNOSIS — B957 Other staphylococcus as the cause of diseases classified elsewhere: Secondary | ICD-10-CM | POA: Diagnosis not present

## 2023-07-24 DIAGNOSIS — I10 Essential (primary) hypertension: Secondary | ICD-10-CM | POA: Diagnosis not present

## 2023-07-24 DIAGNOSIS — B961 Klebsiella pneumoniae [K. pneumoniae] as the cause of diseases classified elsewhere: Secondary | ICD-10-CM | POA: Diagnosis not present

## 2023-07-24 DIAGNOSIS — T8142XA Infection following a procedure, deep incisional surgical site, initial encounter: Secondary | ICD-10-CM | POA: Diagnosis not present

## 2023-07-24 DIAGNOSIS — E785 Hyperlipidemia, unspecified: Secondary | ICD-10-CM | POA: Diagnosis not present

## 2023-07-24 DIAGNOSIS — L02211 Cutaneous abscess of abdominal wall: Secondary | ICD-10-CM | POA: Diagnosis not present

## 2023-07-24 DIAGNOSIS — E119 Type 2 diabetes mellitus without complications: Secondary | ICD-10-CM | POA: Diagnosis not present

## 2023-07-24 DIAGNOSIS — I700 Atherosclerosis of aorta: Secondary | ICD-10-CM | POA: Diagnosis not present

## 2023-07-24 DIAGNOSIS — G20A1 Parkinson's disease without dyskinesia, without mention of fluctuations: Secondary | ICD-10-CM | POA: Diagnosis not present

## 2023-07-25 ENCOUNTER — Encounter: Payer: Self-pay | Admitting: Family Medicine

## 2023-07-25 NOTE — Telephone Encounter (Signed)
 Care team updated and letter sent for eye exam notes.

## 2023-07-28 ENCOUNTER — Ambulatory Visit: Payer: Medicare HMO | Admitting: Neurology

## 2023-07-28 ENCOUNTER — Encounter: Payer: Self-pay | Admitting: Neurology

## 2023-07-28 VITALS — BP 110/65 | HR 74 | Ht 65.0 in | Wt 232.0 lb

## 2023-07-28 DIAGNOSIS — G4733 Obstructive sleep apnea (adult) (pediatric): Secondary | ICD-10-CM

## 2023-07-28 DIAGNOSIS — I700 Atherosclerosis of aorta: Secondary | ICD-10-CM | POA: Diagnosis not present

## 2023-07-28 DIAGNOSIS — I10 Essential (primary) hypertension: Secondary | ICD-10-CM | POA: Diagnosis not present

## 2023-07-28 DIAGNOSIS — Z9289 Personal history of other medical treatment: Secondary | ICD-10-CM

## 2023-07-28 DIAGNOSIS — G20A1 Parkinson's disease without dyskinesia, without mention of fluctuations: Secondary | ICD-10-CM | POA: Diagnosis not present

## 2023-07-28 DIAGNOSIS — E119 Type 2 diabetes mellitus without complications: Secondary | ICD-10-CM | POA: Diagnosis not present

## 2023-07-28 DIAGNOSIS — B957 Other staphylococcus as the cause of diseases classified elsewhere: Secondary | ICD-10-CM | POA: Diagnosis not present

## 2023-07-28 DIAGNOSIS — L02211 Cutaneous abscess of abdominal wall: Secondary | ICD-10-CM | POA: Diagnosis not present

## 2023-07-28 DIAGNOSIS — T8142XA Infection following a procedure, deep incisional surgical site, initial encounter: Secondary | ICD-10-CM | POA: Diagnosis not present

## 2023-07-28 DIAGNOSIS — R634 Abnormal weight loss: Secondary | ICD-10-CM | POA: Diagnosis not present

## 2023-07-28 DIAGNOSIS — B961 Klebsiella pneumoniae [K. pneumoniae] as the cause of diseases classified elsewhere: Secondary | ICD-10-CM | POA: Diagnosis not present

## 2023-07-28 DIAGNOSIS — E785 Hyperlipidemia, unspecified: Secondary | ICD-10-CM | POA: Diagnosis not present
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
## 2023-07-28 NOTE — Patient Instructions (Signed)
Please continue using your autoPAP regularly. While your insurance requires that you use PAP at least 4 hours each night on 70% of the nights, I recommend, that you not skip any nights and use it throughout the night if you can. Getting used to PAP and staying with the treatment long term does take time and patience and discipline. Untreated obstructive sleep apnea when it is moderate to severe can have an adverse impact on cardiovascular health and raise her risk for heart disease, arrhythmias, hypertension, congestive heart failure, stroke and diabetes. Untreated obstructive sleep apnea causes sleep disruption, nonrestorative sleep, and sleep deprivation. This can have an impact on your day to day functioning and cause daytime sleepiness and impairment of cognitive function, memory loss, mood disturbance, and problems focussing. Using PAP regularly can improve these symptoms.   

## 2023-07-31 ENCOUNTER — Ambulatory Visit (INDEPENDENT_AMBULATORY_CARE_PROVIDER_SITE_OTHER): Payer: Medicare HMO

## 2023-07-31 VITALS — BP 111/66 | HR 77 | Ht 65.0 in | Wt 232.0 lb

## 2023-07-31 DIAGNOSIS — E291 Testicular hypofunction: Secondary | ICD-10-CM | POA: Diagnosis not present

## 2023-07-31 DIAGNOSIS — I10 Essential (primary) hypertension: Secondary | ICD-10-CM | POA: Diagnosis not present

## 2023-07-31 DIAGNOSIS — E119 Type 2 diabetes mellitus without complications: Secondary | ICD-10-CM | POA: Diagnosis not present

## 2023-07-31 DIAGNOSIS — B957 Other staphylococcus as the cause of diseases classified elsewhere: Secondary | ICD-10-CM | POA: Diagnosis not present

## 2023-07-31 DIAGNOSIS — E785 Hyperlipidemia, unspecified: Secondary | ICD-10-CM | POA: Diagnosis not present

## 2023-07-31 DIAGNOSIS — L02211 Cutaneous abscess of abdominal wall: Secondary | ICD-10-CM | POA: Diagnosis not present

## 2023-07-31 DIAGNOSIS — T8142XA Infection following a procedure, deep incisional surgical site, initial encounter: Secondary | ICD-10-CM | POA: Diagnosis not present

## 2023-07-31 DIAGNOSIS — B961 Klebsiella pneumoniae [K. pneumoniae] as the cause of diseases classified elsewhere: Secondary | ICD-10-CM | POA: Diagnosis not present

## 2023-07-31 DIAGNOSIS — I700 Atherosclerosis of aorta: Secondary | ICD-10-CM | POA: Diagnosis not present

## 2023-07-31 DIAGNOSIS — G20A1 Parkinson's disease without dyskinesia, without mention of fluctuations: Secondary | ICD-10-CM | POA: Diagnosis not present

## 2023-07-31 NOTE — Patient Instructions (Signed)
Return in 14 days for next testosterone injection ( patient will schedule this with  provider to discuss referral to urology at same visit.)

## 2023-07-31 NOTE — Progress Notes (Signed)
   Established Patient Office Visit  Subjective   Patient ID: Luisdavid Bouwman, male    DOB: 22-Sep-1950  Age: 72 y.o. MRN: [id number]  Chief Complaint  Patient presents with   Hypogonadism    Testosterone injection nurse visit.    HPI  Testosterone injection nurse visit. Patient denies chest pain, shortness of breath, dizziness, palpitations, mood or medication problems. Patient does states he has an upcoming appt with liver transplant surgeon consult on Feb 3rd ( not for liver transplant but just a capable surgeon in the event that is needed at next surgery) . Patient also mentioned that he is requesting a referral to urologist due to urine symptoms of  urgency and frequency continuing .   ROS    Objective:     BP 111/66 (BP Location: Left Arm, Patient Position: Sitting, Cuff Size: Large)   Pulse 77   Ht 5\' 5"  (1.651 m)   Wt 232 lb (105.2 kg)   SpO2 96%   BMI 38.61 kg/m    Physical Exam   No results found for any visits on 07/31/23.    The 10-year ASCVD risk score (Arnett DK, et al., 2019) is: 30.1%    Assessment & Plan:  Testosterone injection nurse visit. Administered 200mg  IM RUOQ. Patient tolerated injection well without complications.  Problem List Items Addressed This Visit   None   No follow-ups on file.    Elizabeth Palau, LPN

## 2023-08-04 ENCOUNTER — Other Ambulatory Visit: Payer: Self-pay | Admitting: Family Medicine

## 2023-08-08 DIAGNOSIS — I10 Essential (primary) hypertension: Secondary | ICD-10-CM | POA: Diagnosis not present

## 2023-08-08 DIAGNOSIS — E119 Type 2 diabetes mellitus without complications: Secondary | ICD-10-CM | POA: Diagnosis not present

## 2023-08-08 DIAGNOSIS — E785 Hyperlipidemia, unspecified: Secondary | ICD-10-CM | POA: Diagnosis not present

## 2023-08-08 DIAGNOSIS — G20A1 Parkinson's disease without dyskinesia, without mention of fluctuations: Secondary | ICD-10-CM | POA: Diagnosis not present

## 2023-08-08 DIAGNOSIS — L02211 Cutaneous abscess of abdominal wall: Secondary | ICD-10-CM | POA: Diagnosis not present

## 2023-08-08 DIAGNOSIS — B957 Other staphylococcus as the cause of diseases classified elsewhere: Secondary | ICD-10-CM | POA: Diagnosis not present

## 2023-08-08 DIAGNOSIS — B961 Klebsiella pneumoniae [K. pneumoniae] as the cause of diseases classified elsewhere: Secondary | ICD-10-CM | POA: Diagnosis not present

## 2023-08-08 DIAGNOSIS — T8142XA Infection following a procedure, deep incisional surgical site, initial encounter: Secondary | ICD-10-CM | POA: Diagnosis not present

## 2023-08-08 DIAGNOSIS — I700 Atherosclerosis of aorta: Secondary | ICD-10-CM | POA: Diagnosis not present

## 2023-08-12 ENCOUNTER — Telehealth: Payer: Self-pay

## 2023-08-12 NOTE — Telephone Encounter (Signed)
 Copied from CRM [id number]. Topic: Clinical - Medical Advice >> Aug 12, 2023 10:16 AM Powell HERO wrote: Reason for CRM: Cold symptoms, congestion sore throat, taking over the counter meds. Feeling very weak, Wanting to speak with a nurse about a possible antibiotics.

## 2023-08-12 NOTE — Telephone Encounter (Signed)
Patient advised to go to the urgent care. We do not have any openings.

## 2023-08-14 ENCOUNTER — Encounter: Payer: Self-pay | Admitting: Family Medicine

## 2023-08-14 ENCOUNTER — Ambulatory Visit (INDEPENDENT_AMBULATORY_CARE_PROVIDER_SITE_OTHER): Payer: Medicare HMO | Admitting: Family Medicine

## 2023-08-14 ENCOUNTER — Ambulatory Visit: Payer: Medicare HMO

## 2023-08-14 ENCOUNTER — Ambulatory Visit: Payer: Self-pay | Admitting: Family Medicine

## 2023-08-14 VITALS — BP 113/73 | HR 85 | Temp 98.5°F | Ht 65.0 in | Wt 233.0 lb

## 2023-08-14 DIAGNOSIS — R059 Cough, unspecified: Secondary | ICD-10-CM | POA: Diagnosis not present

## 2023-08-14 DIAGNOSIS — R051 Acute cough: Secondary | ICD-10-CM | POA: Diagnosis not present

## 2023-08-14 LAB — POC COVID19 BINAXNOW: SARS Coronavirus 2 Ag: NEGATIVE

## 2023-08-14 LAB — POCT RAPID STREP A (OFFICE): Rapid Strep A Screen: NEGATIVE

## 2023-08-14 LAB — POCT INFLUENZA A/B
Influenza A, POC: NEGATIVE
Influenza B, POC: NEGATIVE

## 2023-08-14 MED ORDER — AZITHROMYCIN 250 MG PO TABS: ORAL_TABLET | ORAL | 0 refills | Status: AC

## 2023-08-14 NOTE — Telephone Encounter (Signed)
 Copied from CRM [id number]. Topic: Clinical - Pink Word Triage >> Aug 14, 2023  9:00 AM Joesph PARAS wrote: Reason for Triage: Patient states is cough, congestion, running a fever of misc. value (higher at night), etc. Going on since Sunday, started to get better and then got worse. Thinks has strep.   Chief Complaint: Sore throat with fever and congestion Symptoms: Fever, Sore throat Frequency: x 5 days Pertinent Negatives: Patient denies any major difficulty breathing that doesn't get better with clearing his nose or using the NettiPot and he denies any known contacts with anyone who has diagnosed strep throat. Disposition: [] ED /[] Urgent Care (no appt availability in office) / [x] Appointment(In office/virtual)/ []  [facility]/ [] Home Care/ [] Refused Recommended Disposition /[] [facility]/ []  Follow-up with PCP Additional Notes: Patient states that he has been using a nettipot and has a bad sore throat.  He states that he has been sick for 5 days now and it has been worse the past 3 days.  Patient denies any major difficulty breathing that doesn't get better with clearing his nose or using the NettiPot and he denies any known contacts with anyone who has diagnosed strep throat. He states the mucous from his nose is green/brownish/with some blood in it.  Patient is given home care advice and also an appointment is made for today 08/14/23 at 1:40pm at his PCP office.  He is fine with seeing a different provider than his PCP and advised that if anything can be called in without him coming in for a physical exam, he would be fine with that option if someone just wanted to call him and speak with him.  Patient advised that he started to feel better the other day but then it got worse.  He is advised that if anything gets worse prior to his appointment, urgent cares and the Emergency Room are options for him as well.  Patient verbalized understanding.  Reason for Disposition  Fever present > 3  days (72 hours)  Answer Assessment - Initial Assessment Questions 1. ONSET: When did the throat start hurting? (Hours or days ago)      X 5 days and then it got worse the past 3 days 2. SEVERITY: How bad is the sore throat? (Scale 1-10; mild, moderate or severe)   - MILD (1-3):  Doesn't interfere with eating or normal activities.   - MODERATE (4-7): Interferes with eating some solids and normal activities.   - SEVERE (8-10):  Excruciating pain, interferes with most normal activities.   - SEVERE WITH DYSPHAGIA (10): Can't swallow liquids, drooling.     7 3. STREP EXPOSURE: Has there been any exposure to strep within the past week? If Yes, ask: What type of contact occurred?      Not that I am aware of 4.  VIRAL SYMPTOMS: Are there any symptoms of a cold, such as a runny nose, cough, hoarse voice or red eyes?      Runny nose, heavy congestion, red eyes, feels like a fever but hasn't checked 5. FEVER: Do you have a fever? If Yes, ask: What is your temperature, how was it measured, and when did it start?     Unsure exactly but feels like a fever 6. PUS ON THE TONSILS: Is there pus on the tonsils in the back of your throat?     Redness and mucous from nose is green/brownish/and now has some blood in it 7. OTHER SYMPTOMS: Do you have any other symptoms? (  e.g., difficulty breathing, headache, rash)     Breathing is stuffy until Nettipot for some relief and clearing the nose helps some  Protocols used: Sore Throat-A-AH

## 2023-08-14 NOTE — Progress Notes (Signed)
 Acute Office Visit  Subjective:     Patient ID: Jonathan Yu, male    DOB: 04-May-1951, 73 y.o.   MRN: [id number]  Chief Complaint  Patient presents with   Sore Throat    HPI Patient is in today for URI sxs.  Started 6 days ago with sneezing and by the next day started having more congestion and drainage.  Then developed a cough and very very ST.  Has been using Tylenol , cold meds and netti pot.  He feels like at night he has been getting the fever and today feels weak.  ROS      Objective:    BP 113/73   Pulse 85   Temp 98.5 F (36.9 C) (Oral)   Ht 5' 5 (1.651 m)   Wt 233 lb (105.7 kg)   SpO2 97%   BMI 38.77 kg/m    Physical Exam Constitutional:      Appearance: Normal appearance.  HENT:     Head: Normocephalic and atraumatic.     Right Ear: Tympanic membrane, ear canal and external ear normal. There is no impacted cerumen.     Left Ear: Tympanic membrane, ear canal and external ear normal. There is no impacted cerumen.     Nose: Nose normal.     Mouth/Throat:     Pharynx: Oropharynx is clear.  Eyes:     Conjunctiva/sclera: Conjunctivae normal.  Cardiovascular:     Rate and Rhythm: Normal rate and regular rhythm.  Pulmonary:     Effort: Pulmonary effort is normal.     Breath sounds: Normal breath sounds.     Comments: Crackles at the bases bilaterally  Musculoskeletal:     Cervical back: Neck supple. No tenderness.  Lymphadenopathy:     Cervical: No cervical adenopathy.  Skin:    General: Skin is warm and dry.  Neurological:     Mental Status: He is alert and oriented to person, place, and time.  Psychiatric:        Mood and Affect: Mood normal.     Results for orders placed or performed in visit on 08/14/23  POC COVID-19  Result Value Ref Range   SARS Coronavirus 2 Ag Negative Negative  POCT Influenza A/B  Result Value Ref Range   Influenza A, POC Negative Negative   Influenza B, POC Negative Negative  POCT rapid strep A  Result Value Ref  Range   Rapid Strep A Screen Negative Negative        Assessment & Plan:   Problem List Items Addressed This Visit   None Visit Diagnoses       Acute cough    -  Primary   Relevant Orders   DG Chest 2 View   POC COVID-19 (Completed)   POCT Influenza A/B (Completed)   POCT rapid strep A (Completed)       Negative for COVID and flu today.  Because I am hearing some crackles on exam would like to chest x-ray to evaluate for possible pneumonia.  Will go ahead and start him on azithromycin  until we get the chest x-ray back just to cover for mycoplasma which is present alone in our area currently.  Okay to continue with symptomatic cares stay well-hydrated.  Meds ordered this encounter  Medications   azithromycin  (ZITHROMAX ) 250 MG tablet    Sig: 2 Ttabs PO on Day 1, then one a day x 4 days.    Dispense:  6 tablet    Refill:  0  No follow-ups on file.  Dorothyann Byars, MD

## 2023-08-14 NOTE — Progress Notes (Signed)
 Hi Nick, chest x-ray looks clear no sign of pneumonia.  Go ahead and start the antibiotic and if you are not feeling better after the weekend please give [place] a call.

## 2023-08-15 DIAGNOSIS — B961 Klebsiella pneumoniae [K. pneumoniae] as the cause of diseases classified elsewhere: Secondary | ICD-10-CM | POA: Diagnosis not present

## 2023-08-15 DIAGNOSIS — E119 Type 2 diabetes mellitus without complications: Secondary | ICD-10-CM | POA: Diagnosis not present

## 2023-08-15 DIAGNOSIS — T8142XA Infection following a procedure, deep incisional surgical site, initial encounter: Secondary | ICD-10-CM | POA: Diagnosis not present

## 2023-08-15 DIAGNOSIS — B957 Other staphylococcus as the cause of diseases classified elsewhere: Secondary | ICD-10-CM | POA: Diagnosis not present

## 2023-08-15 DIAGNOSIS — L02211 Cutaneous abscess of abdominal wall: Secondary | ICD-10-CM | POA: Diagnosis not present

## 2023-08-15 DIAGNOSIS — I10 Essential (primary) hypertension: Secondary | ICD-10-CM | POA: Diagnosis not present

## 2023-08-15 DIAGNOSIS — G20A1 Parkinson's disease without dyskinesia, without mention of fluctuations: Secondary | ICD-10-CM | POA: Diagnosis not present

## 2023-08-15 DIAGNOSIS — I700 Atherosclerosis of aorta: Secondary | ICD-10-CM | POA: Diagnosis not present

## 2023-08-15 DIAGNOSIS — E785 Hyperlipidemia, unspecified: Secondary | ICD-10-CM | POA: Diagnosis not present

## 2023-08-18 ENCOUNTER — Encounter: Payer: Self-pay | Admitting: Family Medicine

## 2023-08-18 ENCOUNTER — Ambulatory Visit: Payer: Medicare HMO | Admitting: Family Medicine

## 2023-08-19 MED ORDER — AMOXICILLIN-POT CLAVULANATE 875-125 MG PO TABS: 1.0000 | ORAL_TABLET | Freq: Two times a day (BID) | ORAL | 0 refills | Status: DC

## 2023-08-19 NOTE — Telephone Encounter (Signed)
Meds ordered this encounter  ?Medications  ? amoxicillin-clavulanate (AUGMENTIN) 875-125 MG tablet  ?  Sig: Take 1 tablet by mouth 2 (two) times daily.  ?  Dispense:  14 tablet  ?  Refill:  0  ? ? ?

## 2023-08-21 ENCOUNTER — Ambulatory Visit: Payer: Medicare HMO | Admitting: Family Medicine

## 2023-08-22 ENCOUNTER — Ambulatory Visit: Payer: Medicare HMO | Admitting: Family Medicine

## 2023-08-22 DIAGNOSIS — E785 Hyperlipidemia, unspecified: Secondary | ICD-10-CM | POA: Diagnosis not present

## 2023-08-22 DIAGNOSIS — B961 Klebsiella pneumoniae [K. pneumoniae] as the cause of diseases classified elsewhere: Secondary | ICD-10-CM | POA: Diagnosis not present

## 2023-08-22 DIAGNOSIS — I10 Essential (primary) hypertension: Secondary | ICD-10-CM | POA: Diagnosis not present

## 2023-08-22 DIAGNOSIS — B957 Other staphylococcus as the cause of diseases classified elsewhere: Secondary | ICD-10-CM | POA: Diagnosis not present

## 2023-08-22 DIAGNOSIS — I700 Atherosclerosis of aorta: Secondary | ICD-10-CM | POA: Diagnosis not present

## 2023-08-22 DIAGNOSIS — E119 Type 2 diabetes mellitus without complications: Secondary | ICD-10-CM | POA: Diagnosis not present

## 2023-08-22 DIAGNOSIS — T8142XA Infection following a procedure, deep incisional surgical site, initial encounter: Secondary | ICD-10-CM | POA: Diagnosis not present

## 2023-08-22 DIAGNOSIS — G20A1 Parkinson's disease without dyskinesia, without mention of fluctuations: Secondary | ICD-10-CM | POA: Diagnosis not present

## 2023-08-22 DIAGNOSIS — L02211 Cutaneous abscess of abdominal wall: Secondary | ICD-10-CM | POA: Diagnosis not present

## 2023-08-26 DIAGNOSIS — M25512 Pain in left shoulder: Secondary | ICD-10-CM | POA: Diagnosis not present

## 2023-08-26 DIAGNOSIS — R601 Generalized edema: Secondary | ICD-10-CM | POA: Diagnosis not present

## 2023-08-26 DIAGNOSIS — M25572 Pain in left ankle and joints of left foot: Secondary | ICD-10-CM | POA: Insufficient documentation

## 2023-08-26 DIAGNOSIS — Z96661 Presence of right artificial ankle joint: Secondary | ICD-10-CM | POA: Diagnosis not present

## 2023-08-26 DIAGNOSIS — M19012 Primary osteoarthritis, left shoulder: Secondary | ICD-10-CM | POA: Insufficient documentation

## 2023-08-26 DIAGNOSIS — M19072 Primary osteoarthritis, left ankle and foot: Secondary | ICD-10-CM | POA: Diagnosis not present

## 2023-08-26 DIAGNOSIS — M61072 Myositis ossificans traumatica, left ankle and foot: Secondary | ICD-10-CM | POA: Diagnosis not present

## 2023-08-26 DIAGNOSIS — S2242XD Multiple fractures of ribs, left side, subsequent encounter for fracture with routine healing: Secondary | ICD-10-CM | POA: Diagnosis not present

## 2023-08-26 DIAGNOSIS — M7732 Calcaneal spur, left foot: Secondary | ICD-10-CM | POA: Diagnosis not present

## 2023-08-26 DIAGNOSIS — M778 Other enthesopathies, not elsewhere classified: Secondary | ICD-10-CM | POA: Diagnosis not present

## 2023-08-26 DIAGNOSIS — Q666 Other congenital valgus deformities of feet: Secondary | ICD-10-CM | POA: Diagnosis not present

## 2023-08-27 ENCOUNTER — Other Ambulatory Visit: Payer: Self-pay | Admitting: Family Medicine

## 2023-08-27 NOTE — Telephone Encounter (Signed)
 Requesting rx rf of metforminXR 500mg  Last written 01/02/2023 Last OV 11/202024 Upcoming appt 05/11/2024 ( [facility])

## 2023-08-29 DIAGNOSIS — E785 Hyperlipidemia, unspecified: Secondary | ICD-10-CM | POA: Diagnosis not present

## 2023-08-29 DIAGNOSIS — B961 Klebsiella pneumoniae [K. pneumoniae] as the cause of diseases classified elsewhere: Secondary | ICD-10-CM | POA: Diagnosis not present

## 2023-08-29 DIAGNOSIS — B957 Other staphylococcus as the cause of diseases classified elsewhere: Secondary | ICD-10-CM | POA: Diagnosis not present

## 2023-08-29 DIAGNOSIS — E119 Type 2 diabetes mellitus without complications: Secondary | ICD-10-CM | POA: Diagnosis not present

## 2023-08-29 DIAGNOSIS — L02211 Cutaneous abscess of abdominal wall: Secondary | ICD-10-CM | POA: Diagnosis not present

## 2023-08-29 DIAGNOSIS — G20A1 Parkinson's disease without dyskinesia, without mention of fluctuations: Secondary | ICD-10-CM | POA: Diagnosis not present

## 2023-08-29 DIAGNOSIS — I700 Atherosclerosis of aorta: Secondary | ICD-10-CM | POA: Diagnosis not present

## 2023-08-29 DIAGNOSIS — I10 Essential (primary) hypertension: Secondary | ICD-10-CM | POA: Diagnosis not present

## 2023-08-29 DIAGNOSIS — T8142XA Infection following a procedure, deep incisional surgical site, initial encounter: Secondary | ICD-10-CM | POA: Diagnosis not present

## 2023-09-01 NOTE — Telephone Encounter (Signed)
Unfortunately we cannot send an antibiotic yet.  It may still be viral he may want to do a home test for COVID and flu they still do a kits out there just to make sure that it is not viral in which case we can treat that with its own antiviral regimen.  But if he is not feeling better after this week need an appointment.

## 2023-09-03 DIAGNOSIS — Z9049 Acquired absence of other specified parts of digestive tract: Secondary | ICD-10-CM | POA: Diagnosis not present

## 2023-09-04 DIAGNOSIS — B961 Klebsiella pneumoniae [K. pneumoniae] as the cause of diseases classified elsewhere: Secondary | ICD-10-CM | POA: Diagnosis not present

## 2023-09-04 DIAGNOSIS — E785 Hyperlipidemia, unspecified: Secondary | ICD-10-CM | POA: Diagnosis not present

## 2023-09-04 DIAGNOSIS — I10 Essential (primary) hypertension: Secondary | ICD-10-CM | POA: Diagnosis not present

## 2023-09-04 DIAGNOSIS — B957 Other staphylococcus as the cause of diseases classified elsewhere: Secondary | ICD-10-CM | POA: Diagnosis not present

## 2023-09-04 DIAGNOSIS — I700 Atherosclerosis of aorta: Secondary | ICD-10-CM | POA: Diagnosis not present

## 2023-09-04 DIAGNOSIS — T8142XA Infection following a procedure, deep incisional surgical site, initial encounter: Secondary | ICD-10-CM | POA: Diagnosis not present

## 2023-09-04 DIAGNOSIS — G20A1 Parkinson's disease without dyskinesia, without mention of fluctuations: Secondary | ICD-10-CM | POA: Diagnosis not present

## 2023-09-04 DIAGNOSIS — L02211 Cutaneous abscess of abdominal wall: Secondary | ICD-10-CM | POA: Diagnosis not present

## 2023-09-04 DIAGNOSIS — E119 Type 2 diabetes mellitus without complications: Secondary | ICD-10-CM | POA: Diagnosis not present

## 2023-09-08 ENCOUNTER — Other Ambulatory Visit: Payer: Self-pay | Admitting: Neurology

## 2023-09-15 ENCOUNTER — Other Ambulatory Visit: Payer: Self-pay | Admitting: Family Medicine

## 2023-09-15 ENCOUNTER — Encounter (INDEPENDENT_AMBULATORY_CARE_PROVIDER_SITE_OTHER): Payer: Self-pay | Admitting: Family Medicine

## 2023-09-15 DIAGNOSIS — R5381 Other malaise: Secondary | ICD-10-CM

## 2023-09-16 ENCOUNTER — Encounter: Payer: Self-pay | Admitting: Family Medicine

## 2023-09-16 NOTE — Telephone Encounter (Signed)

## 2023-09-19 ENCOUNTER — Ambulatory Visit (INDEPENDENT_AMBULATORY_CARE_PROVIDER_SITE_OTHER): Payer: Medicare HMO

## 2023-09-19 VITALS — BP 103/66 | HR 88 | Ht 65.0 in | Wt 225.0 lb

## 2023-09-19 DIAGNOSIS — E291 Testicular hypofunction: Secondary | ICD-10-CM | POA: Diagnosis not present

## 2023-09-19 MED ORDER — TESTOSTERONE CYPIONATE 200 MG/ML IM SOLN
200.0000 mg | Freq: Once | INTRAMUSCULAR | Status: AC
  Administered 2023-09-19: 200 mg via INTRAMUSCULAR

## 2023-09-19 NOTE — Progress Notes (Signed)
   Established Patient Office Visit  Subjective   Patient ID: Aydeen Blume, male    DOB: 1951-04-11  Age: 73 y.o. MRN: [id number]  Chief Complaint  Patient presents with   hypogonadism in male    Testosterone  injection nurse visit.     HPI  Hypogonadism in male. Testosterone  injection nurse visit. Patient denies chest pain, shortness of breath, dizziness, palpitations , mood or med problems. Patient restarting testosterone  injections today -per dr. Alvia restart as previously taking (200mg   every 14 days) .  ROS    Objective:     BP 103/66   Pulse 88   Ht 5' 5 (1.651 m)   Wt 225 lb (102.1 kg)   SpO2 97%   BMI 37.44 kg/m    Physical Exam   No results found for any visits on 09/19/23.    The 10-year ASCVD risk score (Arnett DK, et al., 2019) is: 24.2%    Assessment & Plan:  Testosterone  injection admim LUOQ IM . Patient tolerated injection well without complications. Patient should return in 14 days for next testosterone  follow up injection with provider.  Problem List Items Addressed This Visit   None   No follow-ups on file.    Suzen SHAUNNA Plenty, LPN

## 2023-09-19 NOTE — Patient Instructions (Signed)
 Return in 14 days for testostorone follow up and injection with provider

## 2023-09-29 DIAGNOSIS — R5382 Chronic fatigue, unspecified: Secondary | ICD-10-CM | POA: Diagnosis not present

## 2023-09-29 DIAGNOSIS — M6281 Muscle weakness (generalized): Secondary | ICD-10-CM | POA: Diagnosis not present

## 2023-09-29 DIAGNOSIS — G20C Parkinsonism, unspecified: Secondary | ICD-10-CM | POA: Diagnosis not present

## 2023-10-03 DIAGNOSIS — R5382 Chronic fatigue, unspecified: Secondary | ICD-10-CM | POA: Diagnosis not present

## 2023-10-03 DIAGNOSIS — M6281 Muscle weakness (generalized): Secondary | ICD-10-CM | POA: Diagnosis not present

## 2023-10-03 DIAGNOSIS — G20C Parkinsonism, unspecified: Secondary | ICD-10-CM | POA: Diagnosis not present

## 2023-10-06 ENCOUNTER — Ambulatory Visit (INDEPENDENT_AMBULATORY_CARE_PROVIDER_SITE_OTHER): Payer: Medicare HMO | Admitting: Family Medicine

## 2023-10-06 ENCOUNTER — Encounter: Payer: Self-pay | Admitting: Family Medicine

## 2023-10-06 VITALS — BP 106/65 | HR 73 | Ht 65.0 in | Wt 228.0 lb

## 2023-10-06 DIAGNOSIS — I10 Essential (primary) hypertension: Secondary | ICD-10-CM

## 2023-10-06 DIAGNOSIS — K810 Acute cholecystitis: Secondary | ICD-10-CM

## 2023-10-06 DIAGNOSIS — Z7984 Long term (current) use of oral hypoglycemic drugs: Secondary | ICD-10-CM

## 2023-10-06 DIAGNOSIS — G20C Parkinsonism, unspecified: Secondary | ICD-10-CM

## 2023-10-06 DIAGNOSIS — E291 Testicular hypofunction: Secondary | ICD-10-CM | POA: Diagnosis not present

## 2023-10-06 DIAGNOSIS — N400 Enlarged prostate without lower urinary tract symptoms: Secondary | ICD-10-CM | POA: Diagnosis not present

## 2023-10-06 DIAGNOSIS — E785 Hyperlipidemia, unspecified: Secondary | ICD-10-CM

## 2023-10-06 DIAGNOSIS — E119 Type 2 diabetes mellitus without complications: Secondary | ICD-10-CM | POA: Diagnosis not present

## 2023-10-06 DIAGNOSIS — F411 Generalized anxiety disorder: Secondary | ICD-10-CM

## 2023-10-06 NOTE — Patient Instructions (Addendum)
 Discontinue amlodipine.  Try Oofos footwear for foot/ankle pain

## 2023-10-07 LAB — CMP14+EGFR
ALT: 20 IU/L (ref 0–44)
AST: 16 IU/L (ref 0–40)
Albumin: 3.9 g/dL (ref 3.8–4.8)
Alkaline Phosphatase: 78 IU/L (ref 44–121)
BUN/Creatinine Ratio: 14 (ref 10–24)
BUN: 13 mg/dL (ref 8–27)
Bilirubin Total: 1.1 mg/dL (ref 0.0–1.2)
CO2: 27 mmol/L (ref 20–29)
Calcium: 10 mg/dL (ref 8.6–10.2)
Chloride: 103 mmol/L (ref 96–106)
Creatinine, Ser: 0.94 mg/dL (ref 0.76–1.27)
Globulin, Total: 2.8 g/dL (ref 1.5–4.5)
Glucose: 105 mg/dL — ABNORMAL HIGH (ref 70–99)
Potassium: 4.9 mmol/L (ref 3.5–5.2)
Sodium: 143 mmol/L (ref 134–144)
Total Protein: 6.7 g/dL (ref 6.0–8.5)
eGFR: 87 mL/min/{1.73_m2} (ref >59)

## 2023-10-07 LAB — CBC WITH DIFFERENTIAL/PLATELET
Basophils Absolute: 0 10*3/uL (ref 0.0–0.2)
Basos: 1 %
EOS (ABSOLUTE): 0.1 10*3/uL (ref 0.0–0.4)
Eos: 2 %
Hematocrit: 45.8 % (ref 37.5–51.0)
Hemoglobin: 14.4 g/dL (ref 13.0–17.7)
Immature Grans (Abs): 0 10*3/uL (ref 0.0–0.1)
Immature Granulocytes: 0 %
Lymphocytes Absolute: 1.6 10*3/uL (ref 0.7–3.1)
Lymphs: 26 %
MCH: 27.4 pg (ref 26.6–33.0)
MCHC: 31.4 g/dL — ABNORMAL LOW (ref 31.5–35.7)
MCV: 87 fL (ref 79–97)
Monocytes Absolute: 0.5 10*3/uL (ref 0.1–0.9)
Monocytes: 8 %
Neutrophils Absolute: 4 10*3/uL (ref 1.4–7.0)
Neutrophils: 63 %
Platelets: 276 10*3/uL (ref 150–450)
RBC: 5.25 x10E6/uL (ref 4.14–5.80)
RDW: 15.3 % (ref 11.6–15.4)
WBC: 6.3 10*3/uL (ref 3.4–10.8)

## 2023-10-07 LAB — HEMOGLOBIN A1C
Est. average glucose Bld gHb Est-mCnc: 123 mg/dL
Hgb A1c MFr Bld: 5.9 % — ABNORMAL HIGH (ref 4.8–5.6)

## 2023-10-07 LAB — LIPID PANEL WITH LDL/HDL RATIO
Cholesterol, Total: 99 mg/dL — ABNORMAL LOW (ref 100–199)
HDL: 47 mg/dL (ref >39)
LDL Chol Calc (NIH): 37 mg/dL (ref 0–99)
LDL/HDL Ratio: 0.8 ratio (ref 0.0–3.6)
Triglycerides: 67 mg/dL (ref 0–149)
VLDL Cholesterol Cal: 15 mg/dL (ref 5–40)

## 2023-10-07 LAB — ESTRADIOL: Estradiol: 5.8 pg/mL — ABNORMAL LOW (ref 7.6–42.6)

## 2023-10-07 LAB — TESTOSTERONE: Testosterone: 557 ng/dL (ref 264–916)

## 2023-10-07 LAB — PSA: Prostate Specific Ag, Serum: 4.3 ng/mL — ABNORMAL HIGH (ref 0.0–4.0)

## 2023-10-07 MED ORDER — TADALAFIL 5 MG PO TABS: 5.0000 mg | ORAL_TABLET | Freq: Every day | ORAL | 11 refills | Status: AC

## 2023-10-07 NOTE — Assessment & Plan Note (Signed)
 Continues on metformin as well as glipizide.  Updating A1c today.

## 2023-10-07 NOTE — Assessment & Plan Note (Signed)
Stable with Flomax

## 2023-10-07 NOTE — Assessment & Plan Note (Addendum)
 He had prolonged recovery from surgery with complications of bile leak and abscess  But is doing well at this time.

## 2023-10-07 NOTE — Assessment & Plan Note (Signed)
 He does remain on testosterone with anastrozole for elevated estradiol levels.  We had a long discussion today about risk and benefit of medication including potentially accelerating heart disease and/or prostate cancer.  He has not having any worsening BPH symptoms.  I will recheck a PSA level today.  He feels that the benefits he is getting from testosterone far outweigh any potential risk for him at this time.

## 2023-10-07 NOTE — Progress Notes (Signed)
 Jonathan Yu - 73 y.o. male MRN [id number]  Date of birth: [birth year]  Subjective Chief Complaint  Patient presents with   Medical Management of Chronic Issues    HPI Jonathan Yu is a 73 year old male here today for follow-up visit.  Reports he is feeling pretty well.  Continues to see neurology for management of parkinsonism and is stable with current medications.  He has stabilized since his previous gallbladder surgery.   He did lose quite a bit of weight after his gallbladder surgery.  Blood pressure has been running low recently.  He is feeling lightheaded when standing up at times.  He has not had any symptoms related to hypertension including chest pain, shortness of breath, palpitations, headache or vision change.  He remains on testosterone therapy.  He feels that he is getting tremendous benefit from this and feels awful when trying to stop this.  Additionally he has been on Arimidex for elevated estradiol levels.  He feels that this is working well for him.  He does have history of BPH but denies worsening urinary symptoms.  Diabetes is managed with metformin and glipizide.  He is tolerating these well at this time without side effects and/or symptoms of hypoglycemia.  ROS:  A comprehensive ROS was completed and negative except as noted per HPI  Allergies  Allergen Reactions   Sesame Oil Hives and Shortness Of Breath   Clonazepam Hives and Other (See Comments)    Tolerates Ativan   Tape Other (See Comments)    White surgical tape caused some blisters and skin was "on fire for 6 months"   Percocet [Oxycodone-Acetaminophen] Rash   Topamax [Topiramate] Other (See Comments)    Cognitive decline    Past Medical History:  Diagnosis Date   Allergy    Anxiety    Asthma    Cataract    DDD (degenerative disc disease), lumbar    GERD (gastroesophageal reflux disease)    Hyperlipidemia    "no meds since losing 106# 2 yr ago" (10/06/2013)   Hypertension 08/12/2010    "no meds since losing 106# 2 yr ago" (10/06/2013)   Neuromuscular disorder (HCC)    OSA on CPAP    "don't wear mask much since losing 106# 2 yr ago" (10/06/2013)   Parkinson's disease (HCC)    Sleep apnea    Type II diabetes mellitus (HCC)    "no meds since losing 106# 2 yr ago" (10/06/2013)   Walking pneumonia 08/12/1966    Past Surgical History:  Procedure Laterality Date   ANKLE FRACTURE SURGERY Left 08/13/1971   ANKLE RECONSTRUCTION Right 05/13/2011   BILIARY STENT PLACEMENT N/A 03/18/2023   Procedure: BILIARY STENT PLACEMENT;  Surgeon: Vida Rigger, MD;  Location: [facility];  Service: Gastroenterology;  Laterality: N/A;   CARPAL TUNNEL RELEASE Bilateral 08/13/1999   CHOLECYSTECTOMY N/A 03/14/2023   Procedure: LAPAROSCOPIC SUBTOTAL CHOLECYSTECTOMY WITH ICG DYE;  Surgeon: Andria Meuse, MD;  Location: [facility];  Service: General;  Laterality: N/A;   ERCP N/A 03/18/2023   Procedure: ENDOSCOPIC RETROGRADE CHOLANGIOPANCREATOGRAPHY (ERCP);  Surgeon: Vida Rigger, MD;  Location: [facility];  Service: Gastroenterology;  Laterality: N/A;   ESOPHAGOGASTRODUODENOSCOPY (EGD) WITH PROPOFOL N/A 04/29/2023   Procedure: ESOPHAGOGASTRODUODENOSCOPY (EGD) WITH PROPOFOL;  Surgeon: Vida Rigger, MD;  Location: [facility];  Service: Gastroenterology;  Laterality: N/A;   FRACTURE SURGERY     GASTROINTESTINAL STENT REMOVAL N/A 04/29/2023   Procedure: GASTROINTESTINAL STENT REMOVAL;  Surgeon: Vida Rigger, MD;  Location: [facility];  Service: Gastroenterology;  Laterality:  N/A;   HARDWARE REMOVAL Right 10/07/2013   Procedure: RIGHT ANKLE HARDWARE REMOVAL;  Surgeon: Nadara Mustard, MD;  Location: [facility];  Service: Orthopedics;  Laterality: Right;   I & D EXTREMITY Right 11/18/2013   Procedure: IRRIGATION AND DEBRIDEMENT EXTREMITY;  Surgeon: Nadara Mustard, MD;  Location: [facility];  Service: Orthopedics;  Laterality: Right;  Irrigation and Debridement Right Fibula , Place Antibiotic Beads and VAC   JOINT  REPLACEMENT     LIPOMA EXCISION N/A 07/03/2023   Procedure: IRRIGATION AND DEBRIDEMENT OF ABDOMINAL WALL MASS;  Surgeon: Berna Bue, MD;  Location: [facility];  Service: General;  Laterality: N/A;   NASAL SEPTUM SURGERY  08/12/1980   ORIF ANKLE FRACTURE Right 09/08/2013   Procedure: REPAIR SYNDESMOSIS DISRUPTION RIGHT ANKLE;  Surgeon: Nadara Mustard, MD;  Location: [facility];  Service: Orthopedics;  Laterality: Right;   SHOULDER ARTHROSCOPY W/ ROTATOR CUFF REPAIR Right 2006; 2009   SPHINCTEROTOMY  03/18/2023   Procedure: SPHINCTEROTOMY;  Surgeon: Vida Rigger, MD;  Location: [facility];  Service: Gastroenterology;;   TONSILLECTOMY  08/12/1957   TOTAL ANKLE ARTHROPLASTY Right 08/18/2013   Procedure: TOTAL ANKLE ARTHOPLASTY;  Surgeon: Nadara Mustard, MD;  Location: [facility];  Service: Orthopedics;  Laterality: Right;  Right Total Ankle Arthroplasty, Revision Fibular Fracture   URETERAL STENT PLACEMENT  ~ 2010 X 2    Social History   Socioeconomic History   Marital status: Divorced    Spouse name: Not on file   Number of children: 6   Years of education: 14   Highest education level: Some college, no degree  Occupational History   Occupation: sales/ DJ    Comment: part time  Tobacco Use   Smoking status: Some Days    Types: Pipe, Cigars   Smokeless tobacco: Never   Tobacco comments:    Just a few cigars per year  Vaping Use   Vaping status: Never Used  Substance and Sexual Activity   Alcohol use: Not Currently    Comment: 1-2 drinks per year.   Drug use: No   Sexual activity: Not Currently    Birth control/protection: Abstinence  Other Topics Concern   Not on file  Social History Narrative   Lives with his son and his daughter. He does Hotel manager, enjoys singing, music and yard work (when he can).    Social Drivers of Corporate investment banker Strain: Low Risk  (05/05/2023)   Overall Financial Resource Strain (CARDIA)    Difficulty of Paying Living Expenses: Not very hard  Food  Insecurity: No Food Insecurity (07/02/2023)   Hunger Vital Sign    Worried About Running Out of Food in the Last Year: Never true    Ran Out of Food in the Last Year: Never true  Transportation Needs: No Transportation Needs (07/02/2023)   PRAPARE - Administrator, Civil Service (Medical): No    Lack of Transportation (Non-Medical): No  Physical Activity: Inactive (05/05/2023)   Exercise Vital Sign    Days of Exercise per Week: 0 days    Minutes of Exercise per Session: 0 min  Stress: Stress Concern Present (05/05/2023)   [employer] of Occupational Health - Occupational Stress Questionnaire    Feeling of Stress : Rather much  Social Connections: Moderately Isolated (05/06/2023)   Social Connection and Isolation Panel [NHANES]    Frequency of Communication with Friends and Family: More than three times a week    Frequency of Social Gatherings with  Friends and Family: More than three times a week    Attends Religious Services: More than 4 times per year    Active Member of Clubs or Organizations: No    Attends Banker Meetings: Never    Marital Status: Divorced    Family History  Problem Relation Age of Onset   Cancer Mother 43       Lung   Alzheimer's disease Father    Parkinson's disease Father    Early death Sister    Scoliosis Sister    Cleft lip Sister     Health Maintenance  Topic Date Due   COVID-19 Vaccine (3 - Pfizer risk series) 12/08/2019   OPHTHALMOLOGY EXAM  01/11/2023   Diabetic kidney evaluation - Urine ACR  12/26/2023   FOOT EXAM  12/26/2023   HEMOGLOBIN A1C  04/04/2024   Medicare Annual Wellness (AWV)  05/05/2024   Diabetic kidney evaluation - eGFR measurement  10/05/2024   Colonoscopy  06/03/2029   DTaP/Tdap/Td (3 - Td or Tdap) 05/11/2031   Pneumonia Vaccine 65+ Years old  Completed   INFLUENZA VACCINE  Completed   Hepatitis C Screening  Completed   Zoster Vaccines- Shingrix  Completed   HPV VACCINES  Aged Out      ----------------------------------------------------------------------------------------------------------------------------------------------------------------------------------------------------------------- Physical Exam BP 106/65 (BP Location: Left Arm, Patient Position: Sitting, Cuff Size: Large)   Pulse 73   Ht 5\' 5"  (1.651 m)   Wt 228 lb (103.4 kg)   SpO2 97%   BMI 37.94 kg/m   Physical Exam Constitutional:      Appearance: Normal appearance.  HENT:     Head: Normocephalic and atraumatic.  Eyes:     General: No scleral icterus. Cardiovascular:     Rate and Rhythm: Normal rate and regular rhythm.  Pulmonary:     Effort: Pulmonary effort is normal.     Breath sounds: Normal breath sounds.  Musculoskeletal:     Cervical back: Neck supple.  Neurological:     Mental Status: He is alert.  Psychiatric:        Mood and Affect: Mood normal.        Behavior: Behavior normal.     ------------------------------------------------------------------------------------------------------------------------------------------------------------------------------------------------------------------- Assessment and Plan  Type 2 diabetes mellitus without complication, without long-term current use of insulin (HCC) Continues on metformin as well as glipizide.  Updating A1c today.  Parkinsonism [facility]) Managed by neurology.  Stable at this time.  Hypogonadism in male He does remain on testosterone with anastrozole for elevated estradiol levels.  We had a long discussion today about risk and benefit of medication including potentially accelerating heart disease and/or prostate cancer.  He has not having any worsening BPH symptoms.  I will recheck a PSA level today.  He feels that the benefits he is getting from testosterone far outweigh any potential risk for him at this time.  BPH (benign prostatic hyperplasia) Stable with Flomax.  Anxiety state Stable with BuSpar daily and  alprazolam as needed.  Acute gangrenous cholecystitis s/p subtotal cholecysectomy 03/14/2023 He had prolonged recovery from surgery with complications of bile leak and abscess  But is doing well at this time.  Essential hypertension, benign Blood pressure is running a little low at this time.  Discontinue amlodipine.  Continue losartan at current strength.   Meds ordered this encounter  Medications   tadalafil (CIALIS) 5 MG tablet    Sig: Take 1 tablet (5 mg total) by mouth daily.    Dispense:  30 tablet    Refill:  11    Return in about 6 months (around 04/04/2024) for Hypertension, hypogonadism.    This visit occurred during the SARS-CoV-2 public health emergency.  Safety protocols were in place, including screening questions prior to the visit, additional usage of staff PPE, and extensive cleaning of exam room while observing appropriate contact time as indicated for disinfecting solutions.

## 2023-10-07 NOTE — Assessment & Plan Note (Signed)
 Blood pressure is running a little low at this time.  Discontinue amlodipine.  Continue losartan at current strength.

## 2023-10-07 NOTE — Assessment & Plan Note (Signed)
Managed by neurology.  Stable at this time.  

## 2023-10-07 NOTE — Assessment & Plan Note (Signed)
 Stable with BuSpar daily and alprazolam as needed.

## 2023-10-10 ENCOUNTER — Other Ambulatory Visit: Payer: Self-pay | Admitting: Family Medicine

## 2023-10-10 ENCOUNTER — Encounter: Payer: Self-pay | Admitting: Family Medicine

## 2023-10-10 DIAGNOSIS — R972 Elevated prostate specific antigen [PSA]: Secondary | ICD-10-CM

## 2023-10-13 DIAGNOSIS — Z1211 Encounter for screening for malignant neoplasm of colon: Secondary | ICD-10-CM | POA: Diagnosis not present

## 2023-10-13 DIAGNOSIS — K5901 Slow transit constipation: Secondary | ICD-10-CM | POA: Diagnosis not present

## 2023-10-14 DIAGNOSIS — R5382 Chronic fatigue, unspecified: Secondary | ICD-10-CM | POA: Diagnosis not present

## 2023-10-14 DIAGNOSIS — G20C Parkinsonism, unspecified: Secondary | ICD-10-CM | POA: Diagnosis not present

## 2023-10-14 DIAGNOSIS — M6281 Muscle weakness (generalized): Secondary | ICD-10-CM | POA: Diagnosis not present

## 2023-10-16 DIAGNOSIS — M6281 Muscle weakness (generalized): Secondary | ICD-10-CM | POA: Diagnosis not present

## 2023-10-16 DIAGNOSIS — G20C Parkinsonism, unspecified: Secondary | ICD-10-CM | POA: Diagnosis not present

## 2023-10-16 DIAGNOSIS — R5382 Chronic fatigue, unspecified: Secondary | ICD-10-CM | POA: Diagnosis not present

## 2023-10-19 NOTE — Progress Notes (Signed)
 Chief Complaint: No chief complaint on file.   History of Present Illness:  Jonathan Yu is a 73 y.o. male who is seen in consultation from Everrett Coombe, DO for evaluation of elevated PSA as well as BPH with lower urinary tract symptoms.  He has been on testosterone repletion every other week for quite some time.  His most recent PSA is 4.  3.  That was in February 2025.  It was 4.15 in December, 2023.  Dating back to 2019, it was 2.0.  He does have BPH and is on daily Cialis 5 mg as well as tamsulosin.  No significant side effects.  Current IPSS on this dual medical therapy is 19, quality-of-life score 3.   Past Medical History:  Past Medical History:  Diagnosis Date   Allergy    Anxiety    Asthma    Cataract    DDD (degenerative disc disease), lumbar    GERD (gastroesophageal reflux disease)    Hyperlipidemia    "no meds since losing 106# 2 yr ago" (10/06/2013)   Hypertension 08/12/2010   "no meds since losing 106# 2 yr ago" (10/06/2013)   Neuromuscular disorder (HCC)    OSA on CPAP    "don't wear mask much since losing 106# 2 yr ago" (10/06/2013)   Parkinson's disease (HCC)    Sleep apnea    Type II diabetes mellitus (HCC)    "no meds since losing 106# 2 yr ago" (10/06/2013)   Walking pneumonia 08/12/1966    Past Surgical History:  Past Surgical History:  Procedure Laterality Date   ANKLE FRACTURE SURGERY Left 08/13/1971   ANKLE RECONSTRUCTION Right 05/13/2011   BILIARY STENT PLACEMENT N/A 03/18/2023   Procedure: BILIARY STENT PLACEMENT;  Surgeon: Vida Rigger, MD;  Location: [facility];  Service: Gastroenterology;  Laterality: N/A;   CARPAL TUNNEL RELEASE Bilateral 08/13/1999   CHOLECYSTECTOMY N/A 03/14/2023   Procedure: LAPAROSCOPIC SUBTOTAL CHOLECYSTECTOMY WITH ICG DYE;  Surgeon: Andria Meuse, MD;  Location: [facility];  Service: General;  Laterality: N/A;   ERCP N/A 03/18/2023   Procedure: ENDOSCOPIC RETROGRADE CHOLANGIOPANCREATOGRAPHY (ERCP);   Surgeon: Vida Rigger, MD;  Location: [facility];  Service: Gastroenterology;  Laterality: N/A;   ESOPHAGOGASTRODUODENOSCOPY (EGD) WITH PROPOFOL N/A 04/29/2023   Procedure: ESOPHAGOGASTRODUODENOSCOPY (EGD) WITH PROPOFOL;  Surgeon: Vida Rigger, MD;  [facility];  Service: Gastroenterology;  Laterality: N/A;   FRACTURE SURGERY     GASTROINTESTINAL STENT REMOVAL N/A 04/29/2023   Procedure: GASTROINTESTINAL STENT REMOVAL;  Surgeon: Vida Rigger, MD;  [facility];  Service: Gastroenterology;  Laterality: N/A;   HARDWARE REMOVAL Right 10/07/2013   Procedure: RIGHT ANKLE HARDWARE REMOVAL;  Surgeon: Nadara Mustard, MD;  [facility];  Service: Orthopedics;  Laterality: Right;   I & D EXTREMITY Right 11/18/2013   Procedure: IRRIGATION AND DEBRIDEMENT EXTREMITY;  Surgeon: Nadara Mustard, MD;  [facility];  Service: Orthopedics;  Laterality: Right;  Irrigation and Debridement Right Fibula , Place Antibiotic Beads and VAC   JOINT REPLACEMENT     LIPOMA EXCISION N/A 07/03/2023   Procedure: IRRIGATION AND DEBRIDEMENT OF ABDOMINAL WALL MASS;  Surgeon: Berna Bue, MD;  [facility]: [facility];  Service: General;  Laterality: N/A;   NASAL SEPTUM SURGERY  08/12/1980   ORIF ANKLE FRACTURE Right 09/08/2013   Procedure: REPAIR SYNDESMOSIS DISRUPTION RIGHT ANKLE;  Surgeon: Nadara Mustard, MD;  [facility];  Service: Orthopedics;  Laterality: Right;   SHOULDER ARTHROSCOPY W/ ROTATOR CUFF REPAIR Right 2006; 2009  SPHINCTEROTOMY  03/18/2023   Procedure: SPHINCTEROTOMY;  Surgeon: Vida Rigger, MD;  Location: [facility];  Service: Gastroenterology;;   TONSILLECTOMY  08/12/1957   TOTAL ANKLE ARTHROPLASTY Right 08/18/2013   Procedure: TOTAL ANKLE ARTHOPLASTY;  Surgeon: Nadara Mustard, MD;  [facility];  Service: Orthopedics;  Laterality: Right;  Right Total Ankle Arthroplasty, Revision Fibular Fracture   URETERAL STENT PLACEMENT  ~ 2010 X 2    Allergies:  Allergies  Allergen  Reactions   Sesame Oil Hives and Shortness Of Breath   Clonazepam Hives and Other (See Comments)    Tolerates Ativan   Tape Other (See Comments)    White surgical tape caused some blisters and skin was "on fire for 6 months"   Percocet [Oxycodone-Acetaminophen] Rash   Topamax [Topiramate] Other (See Comments)    Cognitive decline    Family History:  Family History  Problem Relation Age of Onset   Cancer Mother 84       Lung   Alzheimer's disease Father    Parkinson's disease Father    Early death Sister    Scoliosis Sister    Cleft lip Sister     Social History:  Social History   Tobacco Use   Smoking status: Some Days    Types: Pipe, Cigars   Smokeless tobacco: Never   Tobacco comments:    Just a few cigars per year  Vaping Use   Vaping status: Never Used  Substance Use Topics   Alcohol use: Not Currently    Comment: 1-2 drinks per year.   Drug use: No    Review of symptoms:  Constitutional:  Negative for unexplained weight loss, night sweats, fever, chills ENT:  Negative for nose bleeds, sinus pain, painful swallowing CV:  Negative for chest pain, shortness of breath, exercise intolerance, palpitations, loss of consciousness Resp:  Negative for cough, wheezing, shortness of breath GI:  Negative for nausea, vomiting, diarrhea, bloody stools GU:  Positives noted in HPI; otherwise negative for gross hematuria, dysuria, urinary incontinence Neuro:  Negative for seizures, poor balance, limb weakness, slurred speech Psych:  Negative for lack of energy, depression, anxiety Endocrine:  Negative for polydipsia, polyuria, symptoms of hypoglycemia (dizziness, hunger, sweating) Hematologic:  Negative for anemia, purpura, petechia, prolonged or excessive bleeding, use of anticoagulants  Allergic:  Negative for difficulty breathing or choking as a result of exposure to anything; no shellfish allergy; no allergic response (rash/itch) to materials, foods  Physical exam: There  were no vitals taken for this visit. GENERAL APPEARANCE:  Well appearing, well developed, well nourished, NAD HEENT: Atraumatic, Normocephalic. NECK: Normal appearance LUNGS: Normal inspiratory and expiratory excursion HEART: Regular Rate ABDOMEN: Obese, no inguinal hernias GU: Phallus normal, no lesions. Scrotal skin normal. Testicles/epididymal structures normal. Meatus normal.  Penis is buried. EXTREMITIES: Moves all extremities well.  Without clubbing, cyanosis, or edema. NEUROLOGIC:  Alert and oriented x 3, normal gait, CN II-XII grossly intact.  MENTAL STATUS:  Appropriate. SKIN:  Warm, dry and intact.    Results: No results found for this or any previous visit (from the past 24 hours).  I have reviewed referring/prior physicians notes  I have reviewed urinalysis  I have reviewed PSA results--4.3  I have reviewed prior imaging--CT images reviewed--estimated prostate volume (ellipsoid) 146 mL  IPSS sheet reviewed  Bladder scan reviewed  Most recent testosterone level reviewed-557.  He is on anastrozole, estradiol level 5.8   Assessment: 1.  Elevated PSA, 4.3.  Based on a huge prostate, PSA density  is 0.03, very low risk for prostate cancer.  2.  BPH with lower urinary tract symptoms, tolerable at this time with him being on tamsulosin and daily Cialis  3.  Buried penis secondary to obesity  4.  Low testosterone, currently on repletion, followed elsewhere for this   Plan: 1.  I reassured the patient that I do not think he needs to undergo ultrasound and biopsy of the prostate based on the size of his prostate on CT scan  2.  I am fine with him continuing the testosterone repletion at this point, no real reason to stop  3.  Continue dual medical therapy for his BPH  4.  I will have him come back in in 3 months for recheck.  If still having issues, consider finasteride.

## 2023-10-20 ENCOUNTER — Ambulatory Visit (INDEPENDENT_AMBULATORY_CARE_PROVIDER_SITE_OTHER): Admitting: Urology

## 2023-10-20 ENCOUNTER — Encounter: Payer: Self-pay | Admitting: Urology

## 2023-10-20 VITALS — BP 126/81 | HR 87 | Ht 65.0 in | Wt 233.0 lb

## 2023-10-20 DIAGNOSIS — R350 Frequency of micturition: Secondary | ICD-10-CM | POA: Diagnosis not present

## 2023-10-20 DIAGNOSIS — N138 Other obstructive and reflux uropathy: Secondary | ICD-10-CM

## 2023-10-20 DIAGNOSIS — E669 Obesity, unspecified: Secondary | ICD-10-CM

## 2023-10-20 DIAGNOSIS — N401 Enlarged prostate with lower urinary tract symptoms: Secondary | ICD-10-CM

## 2023-10-20 DIAGNOSIS — R972 Elevated prostate specific antigen [PSA]: Secondary | ICD-10-CM | POA: Diagnosis not present

## 2023-10-20 DIAGNOSIS — N210 Calculus in bladder: Secondary | ICD-10-CM

## 2023-10-20 DIAGNOSIS — N4883 Acquired buried penis: Secondary | ICD-10-CM | POA: Diagnosis not present

## 2023-10-20 DIAGNOSIS — R7989 Other specified abnormal findings of blood chemistry: Secondary | ICD-10-CM

## 2023-10-20 LAB — URINALYSIS, ROUTINE W REFLEX MICROSCOPIC
Bilirubin, UA: NEGATIVE
Glucose, UA: NEGATIVE
Ketones, UA: NEGATIVE
Leukocytes,UA: NEGATIVE
Nitrite, UA: NEGATIVE
Protein,UA: NEGATIVE
RBC, UA: NEGATIVE
Specific Gravity, UA: 1.025 (ref 1.005–1.030)
Urobilinogen, Ur: 0.2 mg/dL (ref 0.2–1.0)
pH, UA: 5 (ref 5.0–7.5)

## 2023-10-20 LAB — BLADDER SCAN AMB NON-IMAGING

## 2023-10-21 NOTE — Telephone Encounter (Signed)
 Ok to proceed with Testosterone injections per Urology note.

## 2023-10-23 ENCOUNTER — Ambulatory Visit

## 2023-10-23 ENCOUNTER — Ambulatory Visit: Payer: Medicare HMO

## 2023-10-24 DIAGNOSIS — M6281 Muscle weakness (generalized): Secondary | ICD-10-CM | POA: Diagnosis not present

## 2023-10-24 DIAGNOSIS — R5382 Chronic fatigue, unspecified: Secondary | ICD-10-CM | POA: Diagnosis not present

## 2023-10-24 DIAGNOSIS — G20C Parkinsonism, unspecified: Secondary | ICD-10-CM | POA: Diagnosis not present

## 2023-10-27 ENCOUNTER — Ambulatory Visit

## 2023-10-29 ENCOUNTER — Ambulatory Visit (INDEPENDENT_AMBULATORY_CARE_PROVIDER_SITE_OTHER)

## 2023-10-29 VITALS — BP 92/55 | HR 96 | Resp 20 | Ht 65.0 in | Wt 231.6 lb

## 2023-10-29 DIAGNOSIS — R5382 Chronic fatigue, unspecified: Secondary | ICD-10-CM | POA: Diagnosis not present

## 2023-10-29 DIAGNOSIS — G20C Parkinsonism, unspecified: Secondary | ICD-10-CM | POA: Diagnosis not present

## 2023-10-29 DIAGNOSIS — E291 Testicular hypofunction: Secondary | ICD-10-CM

## 2023-10-29 DIAGNOSIS — M6281 Muscle weakness (generalized): Secondary | ICD-10-CM | POA: Diagnosis not present

## 2023-10-29 NOTE — Progress Notes (Signed)
   Subjective:    Patient ID: Jonathan Yu, male    DOB: [birth year], 73 y.o.   MRN: [id number]  HPI  Patient is here for a testosterone injection. Denies chest pain, shortness of breath, headaches and problems with medication or mood changes.   Review of Systems     Objective:   Physical Exam        Assessment & Plan:   Patient tolerated injection well without complications. Patient advised to schedule next injection in 14 days.

## 2023-10-29 NOTE — Progress Notes (Signed)
 Medical screening examination/treatment was performed by qualified clinical staff member and as supervising physician I was immediately available for consultation/collaboration. I have reviewed documentation and agree with assessment and plan.  Everrett Coombe, DO

## 2023-11-03 DIAGNOSIS — Z1211 Encounter for screening for malignant neoplasm of colon: Secondary | ICD-10-CM | POA: Diagnosis not present

## 2023-11-03 DIAGNOSIS — D123 Benign neoplasm of transverse colon: Secondary | ICD-10-CM | POA: Diagnosis not present

## 2023-11-03 LAB — HM COLONOSCOPY

## 2023-11-05 DIAGNOSIS — D123 Benign neoplasm of transverse colon: Secondary | ICD-10-CM | POA: Diagnosis not present

## 2023-11-13 ENCOUNTER — Ambulatory Visit: Admitting: Family Medicine

## 2023-11-13 VITALS — BP 138/63 | HR 73 | Temp 98.4°F | Ht 65.0 in | Wt 231.2 lb

## 2023-11-13 DIAGNOSIS — E291 Testicular hypofunction: Secondary | ICD-10-CM

## 2023-11-13 MED ORDER — TESTOSTERONE CYPIONATE 200 MG/ML IM SOLN
200.0000 mg | Freq: Once | INTRAMUSCULAR | Status: AC
  Administered 2023-11-13: 200 mg via INTRAMUSCULAR

## 2023-11-13 NOTE — Progress Notes (Signed)
 Hypogonadism in male. Testosterone injection nurse visit. Patient denies chest pain, shortness of breath, dizziness, palpitations , mood or med problems. (200mg   every 14 days) .

## 2023-11-13 NOTE — Progress Notes (Signed)
 Medical screening examination/treatment was performed by qualified clinical staff member and as supervising physician I was immediately available for consultation/collaboration. I have reviewed documentation and agree with assessment and plan.  Everrett Coombe, DO

## 2023-11-27 ENCOUNTER — Ambulatory Visit (INDEPENDENT_AMBULATORY_CARE_PROVIDER_SITE_OTHER): Admitting: Family Medicine

## 2023-11-27 VITALS — BP 130/63 | HR 69 | Ht 65.0 in | Wt 241.0 lb

## 2023-11-27 DIAGNOSIS — E291 Testicular hypofunction: Secondary | ICD-10-CM | POA: Diagnosis not present

## 2023-11-27 NOTE — Progress Notes (Signed)
 Hypogonadism in male. Testosterone injection nurse visit. Patient denies chest pain, shortness of breath, dizziness, palpitations , mood or med problems. Patient restarting testosterone injections today -per Dr. Augustus Ledger restart as previously taking (200mg  every 14 days) .

## 2023-11-30 NOTE — Progress Notes (Signed)
 Medical screening examination/treatment was performed by qualified clinical staff member and as supervising physician I was immediately available for consultation/collaboration. I have reviewed documentation and agree with assessment and plan.  Everrett Coombe, DO

## 2023-12-04 DIAGNOSIS — G20C Parkinsonism, unspecified: Secondary | ICD-10-CM | POA: Diagnosis not present

## 2023-12-04 DIAGNOSIS — R5382 Chronic fatigue, unspecified: Secondary | ICD-10-CM | POA: Diagnosis not present

## 2023-12-04 DIAGNOSIS — G20A1 Parkinson's disease without dyskinesia, without mention of fluctuations: Secondary | ICD-10-CM | POA: Diagnosis not present

## 2023-12-04 DIAGNOSIS — M6281 Muscle weakness (generalized): Secondary | ICD-10-CM | POA: Diagnosis not present

## 2023-12-08 ENCOUNTER — Other Ambulatory Visit: Payer: Self-pay | Admitting: Family Medicine

## 2023-12-11 ENCOUNTER — Ambulatory Visit (INDEPENDENT_AMBULATORY_CARE_PROVIDER_SITE_OTHER): Admitting: Family Medicine

## 2023-12-11 VITALS — BP 92/66 | HR 83 | Resp 18 | Ht 65.0 in | Wt 234.0 lb

## 2023-12-11 DIAGNOSIS — E291 Testicular hypofunction: Secondary | ICD-10-CM | POA: Diagnosis not present

## 2023-12-11 MED ORDER — TESTOSTERONE CYPIONATE 200 MG/ML IM SOLN
200.0000 mg | INTRAMUSCULAR | Status: DC
  Administered 2023-12-11 – 2024-01-22 (×4): 200 mg via INTRAMUSCULAR

## 2023-12-11 NOTE — Progress Notes (Signed)
 Pt here for Testosterone  injection. Denies mood changes, CP, SOB. Pt gets 200mg  every 14 days.                           Pt given 200 mg testosterone  in RUOQ. Pt tolerated well. No redness or swelling noted. Pt advised to RTC in 14 days ( 12/25/23)

## 2023-12-17 DIAGNOSIS — G20C Parkinsonism, unspecified: Secondary | ICD-10-CM | POA: Diagnosis not present

## 2023-12-17 DIAGNOSIS — R5382 Chronic fatigue, unspecified: Secondary | ICD-10-CM | POA: Diagnosis not present

## 2023-12-17 DIAGNOSIS — M6281 Muscle weakness (generalized): Secondary | ICD-10-CM | POA: Diagnosis not present

## 2023-12-18 ENCOUNTER — Encounter (HOSPITAL_COMMUNITY): Payer: Self-pay

## 2023-12-25 ENCOUNTER — Ambulatory Visit (INDEPENDENT_AMBULATORY_CARE_PROVIDER_SITE_OTHER)

## 2023-12-25 VITALS — BP 110/70 | HR 74 | Temp 98.1°F | Resp 19 | Ht 60.0 in | Wt 228.0 lb

## 2023-12-25 DIAGNOSIS — E291 Testicular hypofunction: Secondary | ICD-10-CM | POA: Diagnosis not present

## 2023-12-25 DIAGNOSIS — Z96661 Presence of right artificial ankle joint: Secondary | ICD-10-CM | POA: Diagnosis not present

## 2023-12-25 DIAGNOSIS — M19072 Primary osteoarthritis, left ankle and foot: Secondary | ICD-10-CM

## 2023-12-25 DIAGNOSIS — M19022 Primary osteoarthritis, left elbow: Secondary | ICD-10-CM

## 2023-12-25 MED ORDER — TRAMADOL HCL 50 MG PO TABS: 50.0000 mg | ORAL_TABLET | Freq: Three times a day (TID) | ORAL | 0 refills | Status: DC | PRN

## 2023-12-25 NOTE — Progress Notes (Signed)
 Hypogonadism in male. Testosterone  injection nurse visit. Patient denies chest pain, shortness of breath, dizziness, palpitations , mood or med problems. (200mg  every 14 days) . Pt reports no changes to medication or allergy, pt states he's been under the weather as of late with a cough and sinus congestion, ,pt was requesting a med refill on tramadol  50 mg, for his ankle pain.

## 2023-12-25 NOTE — Progress Notes (Signed)
    Procedures performed today:    None.  Independent interpretation of notes and tests performed by another provider:   None.  Brief History, Exam, Impression, and Recommendations:    Chronic bilateral ankle plain, right total ankle arthroplasty, left ankle posttraumatic osteoarthritis Previous history: Increasing pain left worse than right, history of right ankle arthroplasty, this was done by Dr. Julio Ohm, I would like him to get some x-rays of the right ankle we will hold off on injections today due to risk of periprosthetic infection. If we do see signs of arthroplasty loosening we will refer him back to Dr. Julio Ohm, if not we will likely get a three-phase bone scan. I did inject his left ankle joint today.  History today 12/25/2023, patient having increasing ankle pain, requesting refill on tramadol . Today made the medical decision to refill his tramadol . Return to see me as needed.    ____________________________________________ Joselyn Nicely. Sandy Crumb, M.D., ABFM., CAQSM., AME. Primary Care and Sports Medicine [facility]  Adjunct Professor of [facility] Medicine  [employer]  Restaurant manager, fast food

## 2023-12-25 NOTE — Assessment & Plan Note (Signed)
 Previous history: Increasing pain left worse than right, history of right ankle arthroplasty, this was done by Dr. Julio Ohm, I would like him to get some x-rays of the right ankle we will hold off on injections today due to risk of periprosthetic infection. If we do see signs of arthroplasty loosening we will refer him back to Dr. Julio Ohm, if not we will likely get a three-phase bone scan. I did inject his left ankle joint today.  History today 12/25/2023, patient having increasing ankle pain, requesting refill on tramadol . Today made the medical decision to refill his tramadol . Return to see me as needed.

## 2024-01-08 ENCOUNTER — Ambulatory Visit

## 2024-01-08 VITALS — BP 110/73 | HR 73 | Temp 98.0°F | Ht 60.0 in | Wt 235.5 lb

## 2024-01-08 DIAGNOSIS — H9209 Otalgia, unspecified ear: Secondary | ICD-10-CM

## 2024-01-08 DIAGNOSIS — E291 Testicular hypofunction: Secondary | ICD-10-CM

## 2024-01-08 NOTE — Progress Notes (Signed)
 Hypogonadism in male. Testosterone  injection nurse visit. Patient denies chest pain, shortness of breath, dizziness, palpitations , mood or med problems. Patient restarting testosterone  injections today -per Dr. Augustus Ledger restart as previously taking (200mg  every 14 days) .He reports he made some dietary changes to lose weight pt states he lost 5-6 pounds, he also noted that he's had some right ear fullness and pain onset since Tuesday, pt wanted a z-pack

## 2024-01-13 ENCOUNTER — Other Ambulatory Visit: Payer: Self-pay | Admitting: Family Medicine

## 2024-01-14 ENCOUNTER — Telehealth: Payer: Self-pay | Admitting: Urology

## 2024-01-14 NOTE — Telephone Encounter (Signed)
 Called pt to move apt date per Dr Joie Narrow. Msg michelle about blocking that time of per provider request 01/14/24.

## 2024-01-19 ENCOUNTER — Ambulatory Visit: Admitting: Urology

## 2024-01-21 DIAGNOSIS — M6281 Muscle weakness (generalized): Secondary | ICD-10-CM | POA: Diagnosis not present

## 2024-01-21 DIAGNOSIS — G20C Parkinsonism, unspecified: Secondary | ICD-10-CM | POA: Diagnosis not present

## 2024-01-21 DIAGNOSIS — R5381 Other malaise: Secondary | ICD-10-CM | POA: Diagnosis not present

## 2024-01-21 DIAGNOSIS — R5382 Chronic fatigue, unspecified: Secondary | ICD-10-CM | POA: Diagnosis not present

## 2024-01-22 ENCOUNTER — Ambulatory Visit (INDEPENDENT_AMBULATORY_CARE_PROVIDER_SITE_OTHER): Admitting: Sports Medicine

## 2024-01-22 ENCOUNTER — Encounter: Payer: Self-pay | Admitting: Sports Medicine

## 2024-01-22 ENCOUNTER — Other Ambulatory Visit (INDEPENDENT_AMBULATORY_CARE_PROVIDER_SITE_OTHER)

## 2024-01-22 DIAGNOSIS — M75122 Complete rotator cuff tear or rupture of left shoulder, not specified as traumatic: Secondary | ICD-10-CM

## 2024-01-22 DIAGNOSIS — E291 Testicular hypofunction: Secondary | ICD-10-CM | POA: Diagnosis not present

## 2024-01-22 DIAGNOSIS — M25572 Pain in left ankle and joints of left foot: Secondary | ICD-10-CM

## 2024-01-22 DIAGNOSIS — Z96661 Presence of right artificial ankle joint: Secondary | ICD-10-CM

## 2024-01-22 MED ORDER — TRIAMCINOLONE ACETONIDE 40 MG/ML IJ SUSP
40.0000 mg | Freq: Once | INTRAMUSCULAR | Status: AC
  Administered 2024-01-22: 40 mg via INTRAMUSCULAR

## 2024-01-22 MED ORDER — TESTOSTERONE CYPIONATE 200 MG/ML IM SOLN: 200.0000 mg | Freq: Once | INTRAMUSCULAR | Status: DC

## 2024-01-22 NOTE — Assessment & Plan Note (Addendum)
 Pleasant 73 year old male, history of right ankle arthroplasty done by Dr. Julio Ohm, increasing of the left ankle pain, last left ankle joint injection by me was in July 2024, he had an one by another provider about 6 months ago that did not work very well. I injected his ankle joint today with ultrasound guidance, he did note immediate relief. Return to see me as needed for this.

## 2024-01-22 NOTE — Addendum Note (Signed)
 Addended by: OLIVA-AVELLANEDA, Consetta Cosner L on: 01/22/2024 09:39 AM   Modules accepted: Orders

## 2024-01-22 NOTE — Progress Notes (Signed)
    Procedures performed today:    Procedure: Real-time Ultrasound Guided injection of the left ankle joint Device: Samsung HS60  Verbal informed consent obtained.  Time-out conducted.  Noted no overlying erythema, induration, or other signs of local infection.  Skin prepped in a sterile fashion.  Local anesthesia: Topical Ethyl chloride.  With sterile technique and under real time ultrasound guidance: Noted arthritic joint, 1 cc Kenalog 40, 1 cc lidocaine , 1 cc bupivacaine  injected easily Completed without difficulty  Advised to call if fevers/chills, erythema, induration, drainage, or persistent bleeding.  Images permanently stored and available for review in PACS.  Impression: Technically successful ultrasound guided injection.  Independent interpretation of notes and tests performed by another provider:   None.  Brief History, Exam, Impression, and Recommendations:    Chronic bilateral ankle plain, right total ankle arthroplasty, left ankle posttraumatic osteoarthritis Pleasant 73 year old male, history of right ankle arthroplasty done by Dr. Julio Ohm, increasing of the left ankle pain, last left ankle joint injection by me was in July 2024, he had an one by another provider about 6 months ago that did not work very well. I injected his ankle joint today with ultrasound guidance, he did note immediate relief. Return to see me as needed for this.  Complete tear of left rotator cuff Rotator cuff tear arthropathy of the left shoulder, restarting PT per patient request.    ____________________________________________ Joselyn Nicely. Sandy Crumb, M.D., ABFM., CAQSM., AME. Primary Care and Sports Medicine [facility]  Adjunct Professor of [facility] Medicine  [employer]  Restaurant manager, fast food

## 2024-01-22 NOTE — Assessment & Plan Note (Signed)
 Rotator cuff tear arthropathy of the left shoulder, restarting PT per patient request.

## 2024-01-27 NOTE — Progress Notes (Signed)
 Assessment: 1.  Elevated PSA, 4.3.  Based on a huge prostate, PSA density is 0.03, very low risk for prostate cancer.  2.  BPH with lower urinary tract symptoms, tolerable at this time with him being on tamsulosin  and daily Cialis   3.  Buried penis secondary to obesity  4.  Low testosterone , currently on repletion, followed elsewhere for this  5.  Urgency, frequency, urgency incontinence, nocturia   Plan: 1.  I recommend him getting on a diuretic 2 or 3 mornings a week, this will help with nocturia  2.  We will try him on Gemtesa, 1 tablet 3 times a week, he was given 3-week's worth of samples which will actually last him 6 weeks  3.  I will see him back in about 6 weeks to check to see how he is doing with that medicine   History of Present Illness:  3.10.2025: for evaluation of elevated PSA as well as BPH with lower urinary tract symptoms.  He has been on testosterone  repletion every other week for quite some time.  His most recent PSA is 4.3  in February 2025.  It was 4.15 in December, 2023.  Dating back to 2019, it was 2.0.  He does have BPH and is on daily Cialis  5 mg as well as tamsulosin .  No significant side effects.  Current IPSS on this dual medical therapy is 19, quality-of-life score 3.  6.18.2025: His biggest issue nocturia.  He does have bilateral lower extremity edema.  He has a prescription for a diuretic but does not really use it.  Urgency frequency and urgency incontinence are a bother as well.   Past Medical History:  Past Medical History:  Diagnosis Date   Allergy    Anxiety    Asthma    Cataract    DDD (degenerative disc disease), lumbar    GERD (gastroesophageal reflux disease)    Hyperlipidemia    no meds since losing 106# 2 yr ago (10/06/2013)   Hypertension 08/12/2010   no meds since losing 106# 2 yr ago (10/06/2013)   Neuromuscular disorder (HCC)    OSA on CPAP    don't wear mask much since losing 106# 2 yr ago (10/06/2013)    Parkinson's disease (HCC)    Sleep apnea    Type II diabetes mellitus (HCC)    no meds since losing 106# 2 yr ago (10/06/2013)   Walking pneumonia 08/12/1966    Past Surgical History:  Past Surgical History:  Procedure Laterality Date   ANKLE FRACTURE SURGERY Left 08/13/1971   ANKLE RECONSTRUCTION Right 05/13/2011   BILIARY STENT PLACEMENT N/A 03/18/2023   Procedure: BILIARY STENT PLACEMENT;  Surgeon: Ozell Blunt, MD;  [facility]: [facility];  Service: Gastroenterology;  Laterality: N/A;   CARPAL TUNNEL RELEASE Bilateral 08/13/1999   CHOLECYSTECTOMY N/A 03/14/2023   Procedure: LAPAROSCOPIC SUBTOTAL CHOLECYSTECTOMY WITH ICG DYE;  Surgeon: Melvenia Stabs, MD;  Location: [facility];  Service: General;  Laterality: N/A;   ERCP N/A 03/18/2023   Procedure: ENDOSCOPIC RETROGRADE CHOLANGIOPANCREATOGRAPHY (ERCP);  Surgeon: Ozell Blunt, MD;  Location: [facility];  Service: Gastroenterology;  Laterality: N/A;   ESOPHAGOGASTRODUODENOSCOPY (EGD) WITH PROPOFOL  N/A 04/29/2023   Procedure: ESOPHAGOGASTRODUODENOSCOPY (EGD) WITH PROPOFOL ;  Surgeon: Ozell Blunt, MD;  [facility];  Service: Gastroenterology;  Laterality: N/A;   FRACTURE SURGERY     GASTROINTESTINAL STENT REMOVAL N/A 04/29/2023   Procedure: GASTROINTESTINAL STENT REMOVAL;  Surgeon: Ozell Blunt, MD;  [facility];  Service: Gastroenterology;  Laterality:  N/A;   HARDWARE REMOVAL Right 10/07/2013   Procedure: RIGHT ANKLE HARDWARE REMOVAL;  Surgeon: Timothy Ford, MD;  [facility];  Service: Orthopedics;  Laterality: Right;   I & D EXTREMITY Right 11/18/2013   Procedure: IRRIGATION AND DEBRIDEMENT EXTREMITY;  Surgeon: Timothy Ford, MD;  [facility]: [facility];  Service: Orthopedics;  Laterality: Right;  Irrigation and Debridement Right Fibula , Place Antibiotic Beads and VAC   JOINT REPLACEMENT     LIPOMA EXCISION N/A 07/03/2023   Procedure: IRRIGATION AND DEBRIDEMENT OF ABDOMINAL WALL MASS;  Surgeon: Adalberto Acton,  MD;  Location: [facility];  Service: General;  Laterality: N/A;   NASAL SEPTUM SURGERY  08/12/1980   ORIF ANKLE FRACTURE Right 09/08/2013   Procedure: REPAIR SYNDESMOSIS DISRUPTION RIGHT ANKLE;  Surgeon: Timothy Ford, MD;  [facility]: [facility];  Service: Orthopedics;  Laterality: Right;   SHOULDER ARTHROSCOPY W/ ROTATOR CUFF REPAIR Right 2006; 2009   SPHINCTEROTOMY  03/18/2023   Procedure: SPHINCTEROTOMY;  Surgeon: Ozell Blunt, MD;  Location: [facility];  Service: Gastroenterology;;   TONSILLECTOMY  08/12/1957   TOTAL ANKLE ARTHROPLASTY Right 08/18/2013   Procedure: TOTAL ANKLE ARTHOPLASTY;  Surgeon: Timothy Ford, MD;  [facility]: [facility];  Service: Orthopedics;  Laterality: Right;  Right Total Ankle Arthroplasty, Revision Fibular Fracture   URETERAL STENT PLACEMENT  ~ 2010 X 2    Allergies:  Allergies  Allergen Reactions   Sesame Oil Hives and Shortness Of Breath   Clonazepam  Hives and Other (See Comments)    Tolerates Ativan    Tape Other (See Comments)    White surgical tape caused some blisters and skin was on fire for 6 months   Percocet [Oxycodone -Acetaminophen ] Rash   Topamax  [Topiramate ] Other (See Comments)    Cognitive decline    Family History:  Family History  Problem Relation Age of Onset   Cancer Mother 1       Lung   Alzheimer's disease Father    Parkinson's disease Father    Early death Sister    Scoliosis Sister    Cleft lip Sister     Social History:  Social History   Tobacco Use   Smoking status: Some Days    Types: Pipe, Cigars   Smokeless tobacco: Never   Tobacco comments:    Just a few cigars per year  Vaping Use   Vaping status: Never Used  Substance Use Topics   Alcohol use: Not Currently    Comment: 1-2 drinks per year.   Drug use: No    Review of symptoms:  Constitutional:  Negative for unexplained weight loss, night sweats, fever, chills ENT:  Negative for nose bleeds, sinus pain, painful swallowing CV:  Negative for chest pain, shortness  of breath, exercise intolerance, palpitations, loss of consciousness Resp:  Negative for cough, wheezing, shortness of breath GI:  Negative for nausea, vomiting, diarrhea, bloody stools GU:  Positives noted in HPI; otherwise negative for gross hematuria, dysuria, urinary incontinence Neuro:  Negative for seizures, poor balance, limb weakness, slurred speech Psych:  Negative for lack of energy, depression, anxiety Endocrine:  Negative for polydipsia, polyuria, symptoms of hypoglycemia (dizziness, hunger, sweating) Hematologic:  Negative for anemia, purpura, petechia, prolonged or excessive bleeding, use of anticoagulants  Allergic:  Negative for difficulty breathing or choking as a result of exposure to anything; no shellfish allergy; no allergic response (rash/itch) to materials, foods  Physical exam: There were no vitals taken for this visit. GENERAL APPEARANCE:  Well appearing, well developed,  well nourished, NAD HEENT: Atraumatic, Normocephalic. NECK: Normal appearance LUNGS: Normal inspiratory and expiratory excursion HEART: Regular Rate ABDOMEN: Obese, no inguinal hernias GU: Phallus normal, no lesions. Scrotal skin normal. Testicles/epididymal structures normal. Meatus normal.  Penis is buried. EXTREMITIES: Moves all extremities well.  Without clubbing, cyanosis, or edema. NEUROLOGIC:  Alert and oriented x 3, normal gait, CN II-XII grossly intact.  MENTAL STATUS:  Appropriate. SKIN:  Warm, dry and intact.    Results: No results found for this or any previous visit (from the past 24 hours).  I have reviewed referring/prior physicians notes  I have reviewed urinalysis-clear  I have reviewed PSA results--4.3 in February of this year  I have reviewed prior imaging--CT images reviewed--estimated prostate volume (ellipsoid estimate) 146 mL  Digital urine volume 76 mL  Most recent testosterone  level reviewed-557.  He is on anastrozole , estradiol  level 5.8

## 2024-01-28 ENCOUNTER — Ambulatory Visit: Admitting: Urology

## 2024-01-28 VITALS — BP 129/82 | HR 82 | Ht 65.0 in | Wt 230.0 lb

## 2024-01-28 DIAGNOSIS — R350 Frequency of micturition: Secondary | ICD-10-CM | POA: Diagnosis not present

## 2024-01-28 DIAGNOSIS — R5382 Chronic fatigue, unspecified: Secondary | ICD-10-CM | POA: Diagnosis not present

## 2024-01-28 DIAGNOSIS — R972 Elevated prostate specific antigen [PSA]: Secondary | ICD-10-CM

## 2024-01-28 DIAGNOSIS — N4883 Acquired buried penis: Secondary | ICD-10-CM

## 2024-01-28 DIAGNOSIS — N210 Calculus in bladder: Secondary | ICD-10-CM

## 2024-01-28 DIAGNOSIS — N138 Other obstructive and reflux uropathy: Secondary | ICD-10-CM | POA: Diagnosis not present

## 2024-01-28 DIAGNOSIS — E669 Obesity, unspecified: Secondary | ICD-10-CM

## 2024-01-28 DIAGNOSIS — G20C Parkinsonism, unspecified: Secondary | ICD-10-CM | POA: Diagnosis not present

## 2024-01-28 DIAGNOSIS — R5381 Other malaise: Secondary | ICD-10-CM | POA: Diagnosis not present

## 2024-01-28 DIAGNOSIS — M6281 Muscle weakness (generalized): Secondary | ICD-10-CM | POA: Diagnosis not present

## 2024-01-28 DIAGNOSIS — N401 Enlarged prostate with lower urinary tract symptoms: Secondary | ICD-10-CM

## 2024-01-28 LAB — URINALYSIS, ROUTINE W REFLEX MICROSCOPIC
Bilirubin, UA: NEGATIVE
Glucose, UA: NEGATIVE
Ketones, UA: NEGATIVE
Leukocytes,UA: NEGATIVE
Nitrite, UA: NEGATIVE
Protein,UA: NEGATIVE
RBC, UA: NEGATIVE
Specific Gravity, UA: 1.01 (ref 1.005–1.030)
Urobilinogen, Ur: 0.2 mg/dL (ref 0.2–1.0)
pH, UA: 6.5 (ref 5.0–7.5)

## 2024-01-28 LAB — BLADDER SCAN AMB NON-IMAGING: Scan Result: 76

## 2024-02-03 DIAGNOSIS — L02211 Cutaneous abscess of abdominal wall: Secondary | ICD-10-CM | POA: Diagnosis not present

## 2024-02-04 ENCOUNTER — Other Ambulatory Visit: Payer: Self-pay | Admitting: Student

## 2024-02-04 DIAGNOSIS — L02211 Cutaneous abscess of abdominal wall: Secondary | ICD-10-CM

## 2024-02-05 ENCOUNTER — Ambulatory Visit (INDEPENDENT_AMBULATORY_CARE_PROVIDER_SITE_OTHER)

## 2024-02-05 VITALS — BP 124/70 | HR 65 | Ht 65.0 in

## 2024-02-05 DIAGNOSIS — E291 Testicular hypofunction: Secondary | ICD-10-CM

## 2024-02-05 NOTE — Progress Notes (Signed)
   Established Patient Office Visit  Subjective   Patient ID: Jonathan Yu, male    DOB: 1950-08-15  Age: 73 y.o. MRN: [id number]  Chief Complaint  Patient presents with   Hypogonadism in male    Patient here for testosterone  injection  nurse visit but some health issues noted at check in - injection to be postponed until after results of CT scan scheduled for tomorrow    HPI  Hypogonadism in male - patient here for testosterone  injection nurse visit . At check in patient noted that a fist sized mass was found in gall bladder - and previous gall bladder surgical  incision site had small opening with brown/green draining - he states today drainage is pink . He has CT scan tomorrow from [facility] .  He also noted that he has seen urologist.    ROS    Objective:     BP 124/70   Pulse 65   Ht 5' 5 (1.651 m)   SpO2 94%   BMI 38.27 kg/m    Physical Exam   No results found for any visits on 02/05/24.    The ASCVD Risk score (Arnett DK, et al., 2019) failed to calculate for the following reasons:   The valid total cholesterol range is 130 to 320 mg/dL    Assessment & Plan:  Per Dr. Alvia we will hold off testosterone  injection until we get results of CT scan - due to testosterone  could affect the liver and the possibility that the patient's liver could currently be inflamed. Problem List Items Addressed This Visit   None   No follow-ups on file.    Suzen SHAUNNA Plenty, LPN

## 2024-02-06 ENCOUNTER — Ambulatory Visit
Admission: RE | Admit: 2024-02-06 | Discharge: 2024-02-06 | Disposition: A | Discharge status: Home / Self Care | Source: Ambulatory Visit | Attending: Student | Admitting: Student

## 2024-02-06 DIAGNOSIS — L02211 Cutaneous abscess of abdominal wall: Secondary | ICD-10-CM

## 2024-02-06 DIAGNOSIS — K8020 Calculus of gallbladder without cholecystitis without obstruction: Secondary | ICD-10-CM | POA: Diagnosis not present

## 2024-02-06 DIAGNOSIS — N200 Calculus of kidney: Secondary | ICD-10-CM | POA: Diagnosis not present

## 2024-02-06 DIAGNOSIS — N210 Calculus in bladder: Secondary | ICD-10-CM | POA: Diagnosis not present

## 2024-02-06 DIAGNOSIS — N400 Enlarged prostate without lower urinary tract symptoms: Secondary | ICD-10-CM | POA: Diagnosis not present

## 2024-02-06 MED ORDER — IOPAMIDOL (ISOVUE-300) INJECTION 61%
100.0000 mL | Freq: Once | INTRAVENOUS | Status: AC | PRN
  Administered 2024-02-06: 100 mL via INTRAVENOUS

## 2024-02-10 DIAGNOSIS — M25511 Pain in right shoulder: Secondary | ICD-10-CM | POA: Diagnosis not present

## 2024-02-10 DIAGNOSIS — M25512 Pain in left shoulder: Secondary | ICD-10-CM | POA: Diagnosis not present

## 2024-02-17 DIAGNOSIS — M25511 Pain in right shoulder: Secondary | ICD-10-CM | POA: Diagnosis not present

## 2024-02-17 DIAGNOSIS — M25512 Pain in left shoulder: Secondary | ICD-10-CM | POA: Diagnosis not present

## 2024-02-19 ENCOUNTER — Telehealth: Payer: Self-pay

## 2024-02-19 ENCOUNTER — Ambulatory Visit (INDEPENDENT_AMBULATORY_CARE_PROVIDER_SITE_OTHER)

## 2024-02-19 DIAGNOSIS — E291 Testicular hypofunction: Secondary | ICD-10-CM

## 2024-02-19 NOTE — Progress Notes (Addendum)
 Hypogonadism in male.Per Dr. Alvia we will hold off testosterone  injection until we get results of CT scan - due to testosterone  could affect the liver and the possibility that the patient's liver could currently be inflamed.  Covering provider was agreeable to hold off injection until  ct scan is read by pcp.  Pt notified in office that he will receive a call on when to come back for injection.

## 2024-02-24 DIAGNOSIS — M25512 Pain in left shoulder: Secondary | ICD-10-CM | POA: Diagnosis not present

## 2024-02-24 DIAGNOSIS — M25511 Pain in right shoulder: Secondary | ICD-10-CM | POA: Diagnosis not present

## 2024-03-02 DIAGNOSIS — M25511 Pain in right shoulder: Secondary | ICD-10-CM | POA: Diagnosis not present

## 2024-03-02 DIAGNOSIS — M25512 Pain in left shoulder: Secondary | ICD-10-CM | POA: Diagnosis not present

## 2024-03-02 NOTE — Progress Notes (Signed)
 Assessment: 1.  Elevated PSA, 4.3.  Based on a huge prostate, PSA density is 0.03, very low risk for prostate cancer.  2.  BPH with lower urinary tract symptoms, tolerable at this time with him being on tamsulosin  and daily Cialis   3.  Buried penis secondary to obesity  4.  Low testosterone , repletion has stopped due to him having some abscess issues.  I do not think this needs to be discontinued  5.  Urgency, frequency, urgency incontinence, nocturia.  This is improved some with every other day Gemtesa   Plan: 1.  We will switch him to solifenacin  10 mg daily  2.  Try weaning off of tamsulosin   3.  I think it is fine to get back on the testosterone   4.  I will see back in 4 months   History of Present Illness:  3.10.2025: for evaluation of elevated PSA as well as BPH with lower urinary tract symptoms.  He has been on testosterone  repletion every other week for quite some time.  His most recent PSA is 4.3  in February 2025.  It was 4.15 in December, 2023.  Dating back to 2019, it was 2.0.  He does have BPH and is on daily Cialis  5 mg as well as tamsulosin .  No significant side effects.  Current IPSS on this dual medical therapy is 19, quality-of-life score 3.  6.18.2025: His biggest issue nocturia.  He does have bilateral lower extremity edema.  He has a prescription for a diuretic but does not really use it.  Urgency frequency and urgency incontinence are a bother as well.  He was given samples of Gemtesa to take every other day.  I also recommended that he take his diuretic at least 2-3 mornings a week, feeling this would help his nocturia.  7.23.2025: Here for follow-up check.  He recently had another abscess burst on his abdomen.  Temporarily testosterone  has been stopped.  Some improvement of urinary symptoms with him being on every other day Gemtesa.   Past Medical History:  Past Medical History:  Diagnosis Date   Allergy    Anxiety    Asthma    Cataract    DDD  (degenerative disc disease), lumbar    GERD (gastroesophageal reflux disease)    Hyperlipidemia    no meds since losing 106# 2 yr ago (10/06/2013)   Hypertension 08/12/2010   no meds since losing 106# 2 yr ago (10/06/2013)   Neuromuscular disorder (HCC)    OSA on CPAP    don't wear mask much since losing 106# 2 yr ago (10/06/2013)   Parkinson's disease (HCC)    Sleep apnea    Type II diabetes mellitus (HCC)    no meds since losing 106# 2 yr ago (10/06/2013)   Walking pneumonia 08/12/1966    Past Surgical History:  Past Surgical History:  Procedure Laterality Date   ANKLE FRACTURE SURGERY Left 08/13/1971   ANKLE RECONSTRUCTION Right 05/13/2011   BILIARY STENT PLACEMENT N/A 03/18/2023   Procedure: BILIARY STENT PLACEMENT;  Surgeon: Rosalie Kitchens, MD;  Location: [facility];  Service: Gastroenterology;  Laterality: N/A;   CARPAL TUNNEL RELEASE Bilateral 08/13/1999   CHOLECYSTECTOMY N/A 03/14/2023   Procedure: LAPAROSCOPIC SUBTOTAL CHOLECYSTECTOMY WITH ICG DYE;  Surgeon: Teresa Lonni HERO, MD;  Location: [facility];  Service: General;  Laterality: N/A;   ERCP N/A 03/18/2023   Procedure: ENDOSCOPIC RETROGRADE CHOLANGIOPANCREATOGRAPHY (ERCP);  Surgeon: Rosalie Kitchens, MD;  Location: [facility];  Service: Gastroenterology;  Laterality: N/A;  ESOPHAGOGASTRODUODENOSCOPY (EGD) WITH PROPOFOL  N/A 04/29/2023   Procedure: ESOPHAGOGASTRODUODENOSCOPY (EGD) WITH PROPOFOL ;  Surgeon: Rosalie Kitchens, MD;  Location: [facility];  Service: Gastroenterology;  Laterality: N/A;   FRACTURE SURGERY     GASTROINTESTINAL STENT REMOVAL N/A 04/29/2023   Procedure: GASTROINTESTINAL STENT REMOVAL;  Surgeon: Rosalie Kitchens, MD;  Location: [facility];  Service: Gastroenterology;  Laterality: N/A;   HARDWARE REMOVAL Right 10/07/2013   Procedure: RIGHT ANKLE HARDWARE REMOVAL;  Surgeon: Jerona LULLA Sage, MD;  [facility]: [facility];  Service: Orthopedics;  Laterality: Right;   I & D EXTREMITY Right 11/18/2013   Procedure:  IRRIGATION AND DEBRIDEMENT EXTREMITY;  Surgeon: Jerona LULLA Sage, MD;  Location: [facility];  Service: Orthopedics;  Laterality: Right;  Irrigation and Debridement Right Fibula , Place Antibiotic Beads and VAC   JOINT REPLACEMENT     LIPOMA EXCISION N/A 07/03/2023   Procedure: IRRIGATION AND DEBRIDEMENT OF ABDOMINAL WALL MASS;  Surgeon: Signe Mitzie LABOR, MD;  Location: [facility];  Service: General;  Laterality: N/A;   NASAL SEPTUM SURGERY  08/12/1980   ORIF ANKLE FRACTURE Right 09/08/2013   Procedure: REPAIR SYNDESMOSIS DISRUPTION RIGHT ANKLE;  Surgeon: Jerona LULLA Sage, MD;  [facility];  Service: Orthopedics;  Laterality: Right;   SHOULDER ARTHROSCOPY W/ ROTATOR CUFF REPAIR Right 2006; 2009   SPHINCTEROTOMY  03/18/2023   Procedure: SPHINCTEROTOMY;  Surgeon: Rosalie Kitchens, MD;  Location: [facility];  Service: Gastroenterology;;   TONSILLECTOMY  08/12/1957   TOTAL ANKLE ARTHROPLASTY Right 08/18/2013   Procedure: TOTAL ANKLE ARTHOPLASTY;  Surgeon: Jerona LULLA Sage, MD;  [facility];  Service: Orthopedics;  Laterality: Right;  Right Total Ankle Arthroplasty, Revision Fibular Fracture   URETERAL STENT PLACEMENT  ~ 2010 X 2    Allergies:  Allergies  Allergen Reactions   Sesame Oil Hives and Shortness Of Breath   Clonazepam  Hives and Other (See Comments)    Tolerates Ativan    Tape Other (See Comments)    White surgical tape caused some blisters and skin was on fire for 6 months   Percocet [Oxycodone -Acetaminophen ] Rash   Topamax  [Topiramate ] Other (See Comments)    Cognitive decline    Family History:  Family History  Problem Relation Age of Onset   Cancer Mother 69       Lung   Alzheimer's disease Father    Parkinson's disease Father    Early death Sister    Scoliosis Sister    Cleft lip Sister     Social History:  Social History   Tobacco Use   Smoking status: Some Days    Types: Pipe, Cigars   Smokeless tobacco: Never   Tobacco comments:    Just a few cigars per year  Vaping  Use   Vaping status: Never Used  Substance Use Topics   Alcohol use: Not Currently    Comment: 1-2 drinks per year.   Drug use: No    Review of symptoms:  Constitutional:  Negative for unexplained weight loss, night sweats, fever, chills ENT:  Negative for nose bleeds, sinus pain, painful swallowing CV:  Negative for chest pain, shortness of breath, exercise intolerance, palpitations, loss of consciousness Resp:  Negative for cough, wheezing, shortness of breath GI:  Negative for nausea, vomiting, diarrhea, bloody stools GU:  Positives noted in HPI; otherwise negative for gross hematuria, dysuria, urinary incontinence Neuro:  Negative for seizures, poor balance, limb weakness, slurred speech Psych:  Negative for lack of energy, depression, anxiety Endocrine:  Negative for polydipsia, polyuria, symptoms of hypoglycemia (  dizziness, hunger, sweating) Hematologic:  Negative for anemia, purpura, petechia, prolonged or excessive bleeding, use of anticoagulants  Allergic:  Negative for difficulty breathing or choking as a result of exposure to anything; no shellfish allergy; no allergic response (rash/itch) to materials, foods  Physical exam: There were no vitals taken for this visit. GENERAL APPEARANCE:  Well appearing, well developed, well nourished, NAD HEENT: Atraumatic, Normocephalic. NECK: Normal appearance LUNGS: Normal inspiratory and expiratory excursion HEART: Regular Rate EXTREMITIES: Moves all extremities well.  Without clubbing, cyanosis, or edema. NEUROLOGIC:  Alert and oriented x 3, normal gait, CN II-XII grossly intact.  MENTAL STATUS:  Appropriate. SKIN:  Warm, dry and intact.    Results:  I have reviewed referring/prior physicians notes  I have reviewed urinalysis-clear  I have reviewed PSA results--4.3 in February of this year  I have reviewed prior imaging--CT images reviewed--estimated prostate volume (ellipsoid estimate) 146 mL  PV  urine volume 20 mL  Most  recent testosterone  level reviewed-557.  He is on anastrozole , estradiol  level 5.8

## 2024-03-03 ENCOUNTER — Ambulatory Visit: Admitting: Urology

## 2024-03-03 VITALS — BP 109/73 | HR 87 | Ht 65.0 in | Wt 227.0 lb

## 2024-03-03 DIAGNOSIS — N138 Other obstructive and reflux uropathy: Secondary | ICD-10-CM

## 2024-03-03 DIAGNOSIS — G20A1 Parkinson's disease without dyskinesia, without mention of fluctuations: Secondary | ICD-10-CM | POA: Diagnosis not present

## 2024-03-03 DIAGNOSIS — N210 Calculus in bladder: Secondary | ICD-10-CM | POA: Diagnosis not present

## 2024-03-03 DIAGNOSIS — R351 Nocturia: Secondary | ICD-10-CM | POA: Diagnosis not present

## 2024-03-03 DIAGNOSIS — R350 Frequency of micturition: Secondary | ICD-10-CM

## 2024-03-03 DIAGNOSIS — E291 Testicular hypofunction: Secondary | ICD-10-CM | POA: Diagnosis not present

## 2024-03-03 DIAGNOSIS — N4883 Acquired buried penis: Secondary | ICD-10-CM | POA: Diagnosis not present

## 2024-03-03 DIAGNOSIS — N401 Enlarged prostate with lower urinary tract symptoms: Secondary | ICD-10-CM | POA: Diagnosis not present

## 2024-03-03 DIAGNOSIS — N3941 Urge incontinence: Secondary | ICD-10-CM | POA: Diagnosis not present

## 2024-03-03 DIAGNOSIS — R972 Elevated prostate specific antigen [PSA]: Secondary | ICD-10-CM

## 2024-03-03 LAB — URINALYSIS, ROUTINE W REFLEX MICROSCOPIC
Bilirubin, UA: NEGATIVE
Glucose, UA: NEGATIVE
Leukocytes,UA: NEGATIVE
Nitrite, UA: NEGATIVE
RBC, UA: NEGATIVE
Specific Gravity, UA: 1.02 (ref 1.005–1.030)
Urobilinogen, Ur: 0.2 mg/dL (ref 0.2–1.0)
pH, UA: 6 (ref 5.0–7.5)

## 2024-03-03 LAB — BLADDER SCAN AMB NON-IMAGING: Scan Result: 20

## 2024-03-03 LAB — MICROSCOPIC EXAMINATION: Bacteria, UA: NONE SEEN

## 2024-03-03 MED ORDER — SOLIFENACIN SUCCINATE 10 MG PO TABS: 10.0000 mg | ORAL_TABLET | Freq: Every day | ORAL | 11 refills | Status: AC

## 2024-03-05 DIAGNOSIS — L02211 Cutaneous abscess of abdominal wall: Secondary | ICD-10-CM | POA: Diagnosis not present

## 2024-03-05 DIAGNOSIS — K9186 Retained cholelithiasis following cholecystectomy: Secondary | ICD-10-CM | POA: Diagnosis not present

## 2024-03-10 ENCOUNTER — Ambulatory Visit: Admitting: Urology

## 2024-03-10 NOTE — Telephone Encounter (Signed)
 Awaiting pcp to advise

## 2024-03-11 ENCOUNTER — Other Ambulatory Visit: Payer: Self-pay | Admitting: Family Medicine

## 2024-03-12 ENCOUNTER — Telehealth: Payer: Self-pay

## 2024-03-12 NOTE — Telephone Encounter (Signed)
 I called and schedule patient for the testosterone  injection on the nurse visit.     Alvia Bring, DO  [facility], ok for him to resume testosterone  therapy.  CM       Previous Messages    ----- Message ----- From: Matilda Senior, MD Sent: 03/03/2024   2:58 PM EDT To: Bring Alvia, DO  I saw Jonathan Yu today for f/u of BPH. He tells me that he has had his testosterone  stopped. I think it is fine to start it back now. I am aware of his abscess issues.

## 2024-03-18 ENCOUNTER — Ambulatory Visit

## 2024-03-31 DIAGNOSIS — M25512 Pain in left shoulder: Secondary | ICD-10-CM | POA: Diagnosis not present

## 2024-03-31 DIAGNOSIS — M25511 Pain in right shoulder: Secondary | ICD-10-CM | POA: Diagnosis not present

## 2024-04-05 ENCOUNTER — Encounter: Payer: Self-pay | Admitting: Family Medicine

## 2024-04-05 ENCOUNTER — Ambulatory Visit (INDEPENDENT_AMBULATORY_CARE_PROVIDER_SITE_OTHER): Payer: Medicare HMO | Admitting: Family Medicine

## 2024-04-05 VITALS — BP 126/76 | HR 73 | Ht 65.0 in | Wt 234.7 lb

## 2024-04-05 DIAGNOSIS — N400 Enlarged prostate without lower urinary tract symptoms: Secondary | ICD-10-CM | POA: Diagnosis not present

## 2024-04-05 DIAGNOSIS — Z7984 Long term (current) use of oral hypoglycemic drugs: Secondary | ICD-10-CM | POA: Diagnosis not present

## 2024-04-05 DIAGNOSIS — F411 Generalized anxiety disorder: Secondary | ICD-10-CM

## 2024-04-05 DIAGNOSIS — E119 Type 2 diabetes mellitus without complications: Secondary | ICD-10-CM | POA: Diagnosis not present

## 2024-04-05 DIAGNOSIS — G20C Parkinsonism, unspecified: Secondary | ICD-10-CM

## 2024-04-05 DIAGNOSIS — L600 Ingrowing nail: Secondary | ICD-10-CM | POA: Diagnosis not present

## 2024-04-05 DIAGNOSIS — E785 Hyperlipidemia, unspecified: Secondary | ICD-10-CM

## 2024-04-05 DIAGNOSIS — E291 Testicular hypofunction: Secondary | ICD-10-CM

## 2024-04-05 DIAGNOSIS — I10 Essential (primary) hypertension: Secondary | ICD-10-CM

## 2024-04-05 LAB — POCT UA - MICROALBUMIN
Creatinine, POC: 100 mg/dL
Microalbumin Ur, POC: 80 mg/L

## 2024-04-05 LAB — POCT GLYCOSYLATED HEMOGLOBIN (HGB A1C): HbA1c, POC (controlled diabetic range): 5.7 % (ref 0.0–7.0)

## 2024-04-05 MED ORDER — GLIPIZIDE 5 MG PO TABS: 5.0000 mg | ORAL_TABLET | Freq: Every day | ORAL | Status: DC

## 2024-04-05 NOTE — Assessment & Plan Note (Signed)
 PSA elevated.  Evaluated by urology.

## 2024-04-05 NOTE — Assessment & Plan Note (Signed)
Managed by neurology.  Stable at this time.  

## 2024-04-05 NOTE — Assessment & Plan Note (Signed)
He is tolerating atorvastatin well, continue at current strength. 

## 2024-04-05 NOTE — Progress Notes (Signed)
 Jonathan Yu - 73 y.o. male MRN [id number]  Date of birth: 08-21-1950  Subjective Chief Complaint  Patient presents with   Follow-up    HPI Jonathan Yu is a 73 y.o. male here today for follow-up visit.  He reports he is feeling pretty well.  He would like to resume his testosterone  therapy.  Reports that he felt better while taking this.  Energy levels were much better.  PSA had been elevated previously.  He has been cleared by urology to restart testosterone .  Blood sugars are well-controlled.  Continues on metformin  and glipizide .  He does feel like he gets little bit low at times.  A1c today is 5.7%.  Blood pressure remains well-controlled.  Doing well with current medications.  He has not had chest pain, shortness of breath, palpitations, headaches or vision changes.  He continues to see neurology for management of Parkinson's.  Doing pretty well with carbidopa /levodopa .  ROS:  A comprehensive ROS was completed and negative except as noted per HPI  Allergies  Allergen Reactions   Sesame Oil Hives and Shortness Of Breath   Clonazepam  Hives and Other (See Comments)    Tolerates Ativan    Tape Other (See Comments)    White surgical tape caused some blisters and skin was on fire for 6 months   Percocet [Oxycodone -Acetaminophen ] Rash   Topamax  [Topiramate ] Other (See Comments)    Cognitive decline    Past Medical History:  Diagnosis Date   Allergy    Anxiety    Asthma    Cataract    DDD (degenerative disc disease), lumbar    GERD (gastroesophageal reflux disease)    Hyperlipidemia    no meds since losing 106# 2 yr ago (10/06/2013)   Hypertension 08/12/2010   no meds since losing 106# 2 yr ago (10/06/2013)   Neuromuscular disorder (HCC)    OSA on CPAP    don't wear mask much since losing 106# 2 yr ago (10/06/2013)   Parkinson's disease (HCC)    Sleep apnea    Type II diabetes mellitus (HCC)    no meds since losing 106# 2 yr ago (10/06/2013)   Walking  pneumonia 08/12/1966    Past Surgical History:  Procedure Laterality Date   ANKLE FRACTURE SURGERY Left 08/13/1971   ANKLE RECONSTRUCTION Right 05/13/2011   BILIARY STENT PLACEMENT N/A 03/18/2023   Procedure: BILIARY STENT PLACEMENT;  Surgeon: Rosalie Kitchens, MD;  [facility];  Service: Gastroenterology;  Laterality: N/A;   CARPAL TUNNEL RELEASE Bilateral 08/13/1999   CHOLECYSTECTOMY N/A 03/14/2023   Procedure: LAPAROSCOPIC SUBTOTAL CHOLECYSTECTOMY WITH ICG DYE;  Surgeon: Teresa Lonni HERO, MD;  Location: [facility];  Service: General;  Laterality: N/A;   ERCP N/A 03/18/2023   Procedure: ENDOSCOPIC RETROGRADE CHOLANGIOPANCREATOGRAPHY (ERCP);  Surgeon: Rosalie Kitchens, MD;  Location: [facility];  Service: Gastroenterology;  Laterality: N/A;   ESOPHAGOGASTRODUODENOSCOPY (EGD) WITH PROPOFOL  N/A 04/29/2023   Procedure: ESOPHAGOGASTRODUODENOSCOPY (EGD) WITH PROPOFOL ;  Surgeon: Rosalie Kitchens, MD;  Location: [facility];  Service: Gastroenterology;  Laterality: N/A;   FRACTURE SURGERY     GASTROINTESTINAL STENT REMOVAL N/A 04/29/2023   Procedure: GASTROINTESTINAL STENT REMOVAL;  Surgeon: Rosalie Kitchens, MD;  Location: [facility];  Service: Gastroenterology;  Laterality: N/A;   HARDWARE REMOVAL Right 10/07/2013   Procedure: RIGHT ANKLE HARDWARE REMOVAL;  Surgeon: Jerona LULLA Sage, MD;  [facility];  Service: Orthopedics;  Laterality: Right;   I & D EXTREMITY Right 11/18/2013   Procedure: IRRIGATION AND DEBRIDEMENT EXTREMITY;  Surgeon: Jerona LULLA Sage, MD;  [facility];  Service: Orthopedics;  Laterality: Right;  Irrigation and Debridement Right Fibula , Place Antibiotic Beads and VAC   JOINT REPLACEMENT     LIPOMA EXCISION N/A 07/03/2023   Procedure: IRRIGATION AND DEBRIDEMENT OF ABDOMINAL WALL MASS;  Surgeon: Signe Mitzie LABOR, MD;  Location: [facility];  Service: General;  Laterality: N/A;   NASAL SEPTUM SURGERY  08/12/1980   ORIF ANKLE FRACTURE Right 09/08/2013   Procedure: REPAIR  SYNDESMOSIS DISRUPTION RIGHT ANKLE;  Surgeon: Jerona LULLA Sage, MD;  [facility];  Service: Orthopedics;  Laterality: Right;   SHOULDER ARTHROSCOPY W/ ROTATOR CUFF REPAIR Right 2006; 2009   SPHINCTEROTOMY  03/18/2023   Procedure: SPHINCTEROTOMY;  Surgeon: Rosalie Kitchens, MD;  Location: [facility];  Service: Gastroenterology;;   TONSILLECTOMY  08/12/1957   TOTAL ANKLE ARTHROPLASTY Right 08/18/2013   Procedure: TOTAL ANKLE ARTHOPLASTY;  Surgeon: Jerona LULLA Sage, MD;  [facility];  Service: Orthopedics;  Laterality: Right;  Right Total Ankle Arthroplasty, Revision Fibular Fracture   URETERAL STENT PLACEMENT  ~ 2010 X 2    Social History   Socioeconomic History   Marital status: Divorced    Spouse name: Not on file   Number of children: 6   Years of education: 14   Highest education level: Some college, no degree  Occupational History   Occupation: sales/ DJ    Comment: part time  Tobacco Use   Smoking status: Some Days    Types: Pipe, Cigars   Smokeless tobacco: Never   Tobacco comments:    Just a few cigars per year  Vaping Use   Vaping status: Never Used  Substance and Sexual Activity   Alcohol use: Not Currently    Comment: 1-2 drinks per year.   Drug use: No   Sexual activity: Not Currently    Birth control/protection: Abstinence  Other Topics Concern   Not on file  Social History Narrative   Lives with his son and his daughter. He does Hotel manager, enjoys singing, music and yard work (when he can).    Social Drivers of Corporate investment banker Strain: Low Risk  (05/05/2023)   Overall Financial Resource Strain (CARDIA)    Difficulty of Paying Living Expenses: Not very hard  Food Insecurity: No Food Insecurity (07/02/2023)   Hunger Vital Sign    Worried About Running Out of Food in the Last Year: Never true    Ran Out of Food in the Last Year: Never true  Transportation Needs: No Transportation Needs (07/02/2023)   PRAPARE - Administrator, Civil Service  (Medical): No    Lack of Transportation (Non-Medical): No  Physical Activity: Inactive (05/05/2023)   Exercise Vital Sign    Days of Exercise per Week: 0 days    Minutes of Exercise per Session: 0 min  Stress: Stress Concern Present (05/05/2023)   [employer] of Occupational Health - Occupational Stress Questionnaire    Feeling of Stress : Rather much  Social Connections: Moderately Isolated (05/06/2023)   Social Connection and Isolation Panel    Frequency of Communication with Friends and Family: More than three times a week    Frequency of Social Gatherings with Friends and Family: More than three times a week    Attends Religious Services: More than 4 times per year    Active Member of [employer] or Organizations: No    Attends Banker Meetings: Never    Marital Status: Divorced    Family History  Problem  Relation Age of Onset   Cancer Mother 19       Lung   Alzheimer's disease Father    Parkinson's disease Father    Early death Sister    Scoliosis Sister    Cleft lip Sister     Health Maintenance  Topic Date Due   COVID-19 Vaccine (3 - Pfizer risk series) 12/08/2019   OPHTHALMOLOGY EXAM  01/11/2023   INFLUENZA VACCINE  03/12/2024   Medicare Annual Wellness (AWV)  05/05/2024   Diabetic kidney evaluation - eGFR measurement  10/05/2024   HEMOGLOBIN A1C  10/06/2024   Diabetic kidney evaluation - Urine ACR  04/05/2025   FOOT EXAM  04/05/2025   DTaP/Tdap/Td (3 - Td or Tdap) 05/11/2031   Colonoscopy  11/02/2033   Pneumococcal Vaccine: 50+ Years  Completed   Hepatitis C Screening  Completed   Zoster Vaccines- Shingrix  Completed   HPV VACCINES  Aged Out   Meningococcal B Vaccine  Aged Out     ----------------------------------------------------------------------------------------------------------------------------------------------------------------------------------------------------------------- Physical Exam BP 126/76 (BP Location: Left Arm, Patient  Position: Sitting, Cuff Size: Large)   Pulse 73   Ht 5' 5 (1.651 m)   Wt 234 lb 11.2 oz (106.5 kg)   SpO2 95%   BMI 39.06 kg/m   Physical Exam Constitutional:      Appearance: Normal appearance.  Eyes:     General: No scleral icterus. Cardiovascular:     Rate and Rhythm: Normal rate and regular rhythm.  Pulmonary:     Effort: Pulmonary effort is normal.     Breath sounds: Normal breath sounds.  Neurological:     General: No focal deficit present.     Mental Status: He is alert.  Psychiatric:        Mood and Affect: Mood normal.        Behavior: Behavior normal.     ------------------------------------------------------------------------------------------------------------------------------------------------------------------------------------------------------------------- Assessment and Plan  Anxiety state Stable with BuSpar  daily and alprazolam  as needed.  BPH (benign prostatic hyperplasia) PSA elevated.  Evaluated by urology.  Dyslipidemia He is tolerating atorvastatin  well, continue at current strength.    Essential hypertension, benign Blood pressure stable at this time..  Continue losartan  at current strength.  Hypogonadism in male PSA elevated previously.  He has been cleared by urology to return to using testosterone .  Will plan to continue at 200 mg every 2 weeks..  Parkinsonism (HCC) Managed by neurology.  Stable at this time.  Type 2 diabetes mellitus without complication, without long-term current use of insulin  (HCC) He has had some low blood sugars.  A1c of 5.7% today.  Will decrease glipizide  to once daily.  Continue metformin  at current strength.   Meds ordered this encounter  Medications   glipiZIDE  (GLUCOTROL ) 5 MG tablet    Sig: Take 1 tablet (5 mg total) by mouth daily before breakfast.    Return in about 6 months (around 10/06/2024) for Hypertension, Type 2 Diabetes.

## 2024-04-05 NOTE — Assessment & Plan Note (Signed)
 Stable with BuSpar daily and alprazolam as needed.

## 2024-04-05 NOTE — Assessment & Plan Note (Signed)
 PSA elevated previously.  He has been cleared by urology to return to using testosterone .  Will plan to continue at 200 mg every 2 weeks.SABRA

## 2024-04-05 NOTE — Assessment & Plan Note (Signed)
Blood pressure stable at this time.  Continue losartan at current strength.

## 2024-04-05 NOTE — Assessment & Plan Note (Signed)
 He has had some low blood sugars.  A1c of 5.7% today.  Will decrease glipizide  to once daily.  Continue metformin  at current strength.

## 2024-04-07 ENCOUNTER — Telehealth: Payer: Self-pay | Admitting: *Deleted

## 2024-04-07 DIAGNOSIS — M779 Enthesopathy, unspecified: Secondary | ICD-10-CM

## 2024-04-07 NOTE — Telephone Encounter (Signed)
 I called and asked the patient to come in a few minutes early for his appointment because we need xrays of his left ankle.

## 2024-04-08 ENCOUNTER — Encounter: Payer: Self-pay | Admitting: Podiatry

## 2024-04-08 ENCOUNTER — Ambulatory Visit: Admitting: Podiatry

## 2024-04-08 ENCOUNTER — Ambulatory Visit

## 2024-04-08 DIAGNOSIS — M19072 Primary osteoarthritis, left ankle and foot: Secondary | ICD-10-CM

## 2024-04-08 DIAGNOSIS — M779 Enthesopathy, unspecified: Secondary | ICD-10-CM

## 2024-04-08 DIAGNOSIS — M25572 Pain in left ankle and joints of left foot: Secondary | ICD-10-CM

## 2024-04-08 DIAGNOSIS — M7662 Achilles tendinitis, left leg: Secondary | ICD-10-CM | POA: Diagnosis not present

## 2024-04-08 DIAGNOSIS — L600 Ingrowing nail: Secondary | ICD-10-CM

## 2024-04-08 DIAGNOSIS — M7989 Other specified soft tissue disorders: Secondary | ICD-10-CM | POA: Diagnosis not present

## 2024-04-08 MED ORDER — DEXAMETHASONE SODIUM PHOSPHATE 120 MG/30ML IJ SOLN
4.0000 mg | Freq: Once | INTRAMUSCULAR | Status: AC
  Administered 2024-04-08: 4 mg via INTRA_ARTICULAR

## 2024-04-08 MED ORDER — TRIAMCINOLONE ACETONIDE 10 MG/ML IJ SUSP
2.5000 mg | Freq: Once | INTRAMUSCULAR | Status: AC
  Administered 2024-04-08: 2.5 mg via INTRA_ARTICULAR

## 2024-04-08 NOTE — Progress Notes (Signed)
 Subjective:  Patient ID: Jonathan Yu, male    DOB: Jun 13, 1951,   MRN: [id number]  Chief Complaint  Patient presents with   Foot Pain    My left ankle has been bothering me for the past six months.  It mostly hurts when I try to pivot on it.   Diabetes    I have an ingrown on my right big toe.  Saw Dr. Velma Ku - 04/05/2024; A1c - 5.7    73 y.o. male presents for two concerns as above. Relates the ingrown has been present for a long time and trims it but continues to return. Relates left ankle pain that has been ongoing for years. He had a bad accident when he was younger and had surgery on both ankles and a replacement on the right. . Relates burning and tingling in their feet. Patient is diabetic and last A1c was  Lab Results  Component Value Date   HGBA1C 5.7 04/05/2024   .   PCP:  Ku Velma, DO    . Denies any other pedal complaints. Denies n/v/f/c.   Past Medical History:  Diagnosis Date   Allergy    Anxiety    Asthma    Cataract    DDD (degenerative disc disease), lumbar    GERD (gastroesophageal reflux disease)    Hyperlipidemia    no meds since losing 106# 2 yr ago (10/06/2013)   Hypertension 08/12/2010   no meds since losing 106# 2 yr ago (10/06/2013)   Neuromuscular disorder (HCC)    OSA on CPAP    don't wear mask much since losing 106# 2 yr ago (10/06/2013)   Parkinson's disease (HCC)    Sleep apnea    Type II diabetes mellitus (HCC)    no meds since losing 106# 2 yr ago (10/06/2013)   Walking pneumonia 08/12/1966    Objective:  Physical Exam: Vascular: DP/PT pulses 2/4 bilateral. CFT <3 seconds. Normal hair growth on digits. No edema.  Skin. No lacerations or abrasions bilateral feet. Incurvation of lateral border or right hallux No erythema edema or purulence noted.  Musculoskeletal: MMT 5/5 bilateral lower extremities in DF, PF, Inversion and Eversion. Deceased ROM in DF of ankle joint. Tender to lateral subtalar joint area and some  pain medially. Reduced STJ ROM. Some pain with ROM of the ankle on the left.  Neurological: Sensation intact to light touch.   Assessment:  No diagnosis found.   Plan:  Patient was evaluated and treated and all questions answered. Discussed ingrown toenails etiology and treatment options including procedure for removal vs conservative care.  Patient requesting removal of ingrown nail today. Procedure below.  Discussed procedure and post procedure care and patient expressed understanding.  Will follow-up in 2 weeks for nail check or sooner if any problems arise.   Discussed subtalar and ankle arthritis with patient and treatment options.  X-rays reviewed. No acute fractures or dislocations. Degenerative changes noted in the subtalar joint and some mild degenerative changes in the ankle joint.  Discussed NSAIDS, topicals, and possible injections.  Injection offered today procedure below.  Discussed CMO and will consider.    Procedure: Injection Tendon/Ligament Discussed alternatives, risks, complications and verbal consent was obtained.  Location: Left subtalar joint . Skin Prep: Alcohol. Injectate: 1cc 0.5% marcaine  plain, 1 cc dexamethasone  0.5 cc kenalog   Disposition: Patient tolerated procedure well. Injection site dressed with a band-aid.  Post-injection care was discussed and return precautions discussed.      Procedure:  Procedure: partial Nail  Avulsion of right hallux lateral nail border.  Surgeon: Asberry Failing, DPM  Pre-op Dx: Ingrown toenail without infection Post-op: Same  Place of Surgery: Office exam room.  Indications for surgery: Painful and ingrown toenail.    The patient is requesting removal of nail with  chemical matrixectomy. Risks and complications were discussed with the patient for which they understand and written consent was obtained. Under sterile conditions a total of 3 mL of  1% lidocaine  plain was infiltrated in a hallux block fashion. Once  anesthetized, the skin was prepped in sterile fashion. A tourniquet was then applied. Next the lateral aspect of hallux nail border was then sharply excised making sure to remove the entire offending nail border.  Next phenol was then applied under standard conditions to permanently destroy the matrix and copiously irrigated. Silvadene was applied. A dry sterile dressing was applied. After application of the dressing the tourniquet was removed and there is found to be an immediate capillary refill time to the digit. The patient tolerated the procedure well without any complications. Post procedure instructions were discussed the patient for which he verbally understood. Follow-up in two weeks for nail check or sooner if any problems are to arise. Discussed signs/symptoms of infection and directed to call the office immediately should any occur or go directly to the emergency room. In the meantime, encouraged to call the office with any questions, concerns, changes symptoms.   Asberry Failing, DPM

## 2024-04-08 NOTE — Patient Instructions (Signed)

## 2024-04-13 ENCOUNTER — Encounter: Payer: Self-pay | Admitting: Sports Medicine

## 2024-04-22 ENCOUNTER — Ambulatory Visit

## 2024-04-23 ENCOUNTER — Ambulatory Visit

## 2024-04-23 ENCOUNTER — Encounter: Payer: Self-pay | Admitting: Podiatry

## 2024-04-23 ENCOUNTER — Ambulatory Visit: Admitting: Podiatry

## 2024-04-23 VITALS — BP 110/70 | HR 69 | Temp 98.2°F | Ht 65.0 in | Wt 238.2 lb

## 2024-04-23 DIAGNOSIS — L600 Ingrowing nail: Secondary | ICD-10-CM

## 2024-04-23 DIAGNOSIS — M19072 Primary osteoarthritis, left ankle and foot: Secondary | ICD-10-CM

## 2024-04-23 DIAGNOSIS — E291 Testicular hypofunction: Secondary | ICD-10-CM

## 2024-04-23 MED ORDER — TESTOSTERONE CYPIONATE 200 MG/ML IM SOLN
200.0000 mg | INTRAMUSCULAR | Status: AC
  Administered 2024-05-07 – 2024-06-17 (×4): 200 mg via INTRAMUSCULAR

## 2024-04-23 NOTE — Progress Notes (Signed)
  Subjective:  Patient ID: Jonathan Yu, male    DOB: 02/08/1951,   MRN: [id number]  Chief Complaint  Patient presents with   Arthritis    The ankle feels so much better.     Nail Problem    My right bit toe is doing good.    73 y.o. male presents for follow-up of ingrown nail removal. Relates doing well and had been soaking as instructed. Relates the injection did help and ankle doing much better.   Relates burning and tingling in their feet. Patient is diabetic and last A1c was  Lab Results  Component Value Date   HGBA1C 5.7 04/05/2024   .   PCP:  Alvia Bring, DO    . Denies any other pedal complaints. Denies n/v/f/c.   Past Medical History:  Diagnosis Date   Allergy    Anxiety    Asthma    Cataract    DDD (degenerative disc disease), lumbar    GERD (gastroesophageal reflux disease)    Hyperlipidemia    no meds since losing 106# 2 yr ago (10/06/2013)   Hypertension 08/12/2010   no meds since losing 106# 2 yr ago (10/06/2013)   Neuromuscular disorder (HCC)    OSA on CPAP    don't wear mask much since losing 106# 2 yr ago (10/06/2013)   Parkinson's disease (HCC)    Sleep apnea    Type II diabetes mellitus (HCC)    no meds since losing 106# 2 yr ago (10/06/2013)   Walking pneumonia 08/12/1966    Objective:  Physical Exam: Vascular: DP/PT pulses 2/4 bilateral. CFT <3 seconds. Normal hair growth on digits. No edema.  Skin. No lacerations or abrasions bilateral feet. Right hallux nail healing well.  Musculoskeletal: MMT 5/5 bilateral lower extremities in DF, PF, Inversion and Eversion. Deceased ROM in DF of ankle joint. Tender to lateral subtalar joint area and some pain medially. Reduced STJ ROM. Some pain with ROM of the ankle on the left.  Neurological: Sensation intact to light touch.   Assessment:   1. Ingrown right greater toenail   2. Arthritis of left subtalar joint      Plan:  Patient was evaluated and treated and all questions answered. Toe  was evaluated and appears to be healing well.  May discontinue soaks and neosporin.  Patient to follow-up as needed.   Discussed subtalar and ankle arthritis with patient and treatment options.  X-rays reviewed. No acute fractures or dislocations. Degenerative changes noted in the subtalar joint and some mild degenerative changes in the ankle joint.  Discussed NSAIDS, topicals, and possible injections.  Injection still working.  Discussed CMO and will consider.  Return as needed for future injection if needed        Asberry Failing, DPM

## 2024-04-23 NOTE — Progress Notes (Signed)
 Patient is in office today for a nurse visit for Testosterone  Injection. Patient Injection was given in the  Right upper quad. gluteus. Patient tolerated injection well.  Pt to RTC in 2 weeks for next injection.

## 2024-05-02 ENCOUNTER — Other Ambulatory Visit: Payer: Self-pay | Admitting: Family Medicine

## 2024-05-02 DIAGNOSIS — I10 Essential (primary) hypertension: Secondary | ICD-10-CM

## 2024-05-05 ENCOUNTER — Telehealth: Payer: Self-pay

## 2024-05-05 ENCOUNTER — Other Ambulatory Visit (HOSPITAL_COMMUNITY): Payer: Self-pay

## 2024-05-05 NOTE — Telephone Encounter (Signed)
 Pharmacy Patient Advocate Encounter   Received notification from Onbase that prior authorization for Tadalafil  5mg  tabs is required/requested.   Insurance verification completed.   The patient is insured through [employer] .   Per test claim: PA required; PA submitted to above mentioned insurance via Latent Key/confirmation #/EOC [id number] Status is pending   Patient will also need a new script sent in to his pharmacy. I sent the fax back to the office.

## 2024-05-05 NOTE — Telephone Encounter (Signed)
 Pharmacy Patient Advocate Encounter  Received notification from [employer] that Prior Authorization for Tadalafil  5mg  tabs has been DENIED.  Full denial letter will be uploaded to the media tab. See denial reason below.   PA #/Case ID/Reference #: [id number]

## 2024-05-06 ENCOUNTER — Ambulatory Visit

## 2024-05-06 NOTE — Progress Notes (Unsigned)
 Pt here for his testosterone  injection. Pt receives 200 mg every 2 wks Patient denies chest pain, shortness of breath, dizziness, headaches or palpitations.Injection was tolerated LUOQ well by the patient. (See MAR for injection details) pt advised to RTC in 2 wks for next testosterone  injection.

## 2024-05-07 ENCOUNTER — Telehealth: Payer: Self-pay

## 2024-05-07 ENCOUNTER — Ambulatory Visit (INDEPENDENT_AMBULATORY_CARE_PROVIDER_SITE_OTHER)

## 2024-05-07 VITALS — BP 145/70 | HR 62 | Resp 17 | Ht 65.0 in

## 2024-05-07 DIAGNOSIS — E291 Testicular hypofunction: Secondary | ICD-10-CM

## 2024-05-07 NOTE — Telephone Encounter (Signed)
 Copied from CRM [id number]. Topic: Appointments - Scheduling Inquiry for Clinic >> May 07, 2024  8:18 AM Jonathan Yu wrote: Reason for CRM: Patient missed injection appointment yesterday. Will be in the area today and would like to reschedule for today if possible before 4pm. Contact [phone]

## 2024-05-07 NOTE — Telephone Encounter (Signed)
 Patient scheduled for today 05/07/2024 @ 11am for nurse visit for testosterone  injection

## 2024-05-07 NOTE — Telephone Encounter (Signed)
 Routed to provider pt notified through MyChart.

## 2024-05-07 NOTE — Progress Notes (Signed)
 Pt here for his testosterone  injection. Pt receives 200 mg every 2 wks Patient denies chest pain, shortness of breath, dizziness, headaches or palpitations.Injection was tolerated RUOQ well by the patient. (See MAR for injection details) pt advised to RTC in 2 wks (05/21/24) for next testosterone  injection.

## 2024-05-10 DIAGNOSIS — H52223 Regular astigmatism, bilateral: Secondary | ICD-10-CM | POA: Diagnosis not present

## 2024-05-10 DIAGNOSIS — H16203 Unspecified keratoconjunctivitis, bilateral: Secondary | ICD-10-CM | POA: Diagnosis not present

## 2024-05-10 DIAGNOSIS — H5213 Myopia, bilateral: Secondary | ICD-10-CM | POA: Diagnosis not present

## 2024-05-10 DIAGNOSIS — H25813 Combined forms of age-related cataract, bilateral: Secondary | ICD-10-CM | POA: Diagnosis not present

## 2024-05-18 ENCOUNTER — Encounter: Payer: Self-pay | Admitting: Family Medicine

## 2024-05-19 ENCOUNTER — Encounter: Payer: Self-pay | Admitting: Family Medicine

## 2024-05-19 DIAGNOSIS — R2681 Unsteadiness on feet: Secondary | ICD-10-CM | POA: Insufficient documentation

## 2024-05-19 NOTE — Progress Notes (Unsigned)
 Pt here for his testosterone  injection. Pt receives 200 mg every 2 wks Patient denies chest pain, shortness of breath, dizziness, headaches or palpitations.Injection was tolerated LUOQ well by the patient. (See MAR for injection details) pt advised to RTC in 2 wks (06/02/24) for next testosterone  injection.

## 2024-05-20 ENCOUNTER — Other Ambulatory Visit: Payer: Self-pay | Admitting: Family Medicine

## 2024-05-20 ENCOUNTER — Ambulatory Visit (INDEPENDENT_AMBULATORY_CARE_PROVIDER_SITE_OTHER)

## 2024-05-20 VITALS — BP 139/71 | HR 75 | Resp 16 | Ht 65.0 in

## 2024-05-20 DIAGNOSIS — E291 Testicular hypofunction: Secondary | ICD-10-CM | POA: Diagnosis not present

## 2024-05-20 MED ORDER — AMBULATORY NON FORMULARY MEDICATION: 0 refills | Status: AC

## 2024-05-20 NOTE — Telephone Encounter (Signed)
 Prescription printed off and given to the patient today in office at nurse visit. Annabella Rigg, CMA

## 2024-05-26 NOTE — Progress Notes (Unsigned)
 Opened in error

## 2024-05-31 ENCOUNTER — Ambulatory Visit: Admitting: Podiatry

## 2024-05-31 ENCOUNTER — Encounter: Payer: Self-pay | Admitting: Podiatry

## 2024-05-31 DIAGNOSIS — M19072 Primary osteoarthritis, left ankle and foot: Secondary | ICD-10-CM

## 2024-05-31 MED ORDER — TRIAMCINOLONE ACETONIDE 10 MG/ML IJ SUSP
2.5000 mg | Freq: Once | INTRAMUSCULAR | Status: AC
  Administered 2024-05-31: 2.5 mg via INTRA_ARTICULAR

## 2024-05-31 MED ORDER — DEXAMETHASONE SODIUM PHOSPHATE 120 MG/30ML IJ SOLN
4.0000 mg | Freq: Once | INTRAMUSCULAR | Status: AC
  Administered 2024-05-31: 4 mg via INTRA_ARTICULAR

## 2024-05-31 NOTE — Progress Notes (Signed)
  Subjective:  Patient ID: Jonathan Yu, male    DOB: 30-Oct-1950,   MRN: [id number]  Chief Complaint  Patient presents with   Arthritis    My foot hurt up until yesterday.  About 3 weeks ago, the injection started wearing off.  I had a fall last Monday night.    73 y.o. male presents for concern of left subtalar arthritis.  Relates the last injection worked for about 3-1/2 weeks.  He relates he is not ready for another injection.  He does relate a fall on Monday night. Patient is diabetic and last A1c was  Lab Results  Component Value Date   HGBA1C 5.7 04/05/2024   .   PCP:  Alvia Bring, DO    . Denies any other pedal complaints. Denies n/v/f/c.   Past Medical History:  Diagnosis Date   Allergy    Anxiety    Asthma    Cataract    DDD (degenerative disc disease), lumbar    GERD (gastroesophageal reflux disease)    Hyperlipidemia    no meds since losing 106# 2 yr ago (10/06/2013)   Hypertension 08/12/2010   no meds since losing 106# 2 yr ago (10/06/2013)   Neuromuscular disorder (HCC)    OSA on CPAP    don't wear mask much since losing 106# 2 yr ago (10/06/2013)   Parkinson's disease (HCC)    Sleep apnea    Type II diabetes mellitus (HCC)    no meds since losing 106# 2 yr ago (10/06/2013)   Walking pneumonia 08/12/1966    Objective:  Physical Exam: Vascular: DP/PT pulses 2/4 bilateral. CFT <3 seconds. Normal hair growth on digits. No edema.  Skin. No lacerations or abrasions bilateral feet. Musculoskeletal: MMT 5/5 bilateral lower extremities in DF, PF, Inversion and Eversion. Deceased ROM in DF of ankle joint. Tender to lateral subtalar joint area and some pain medially. Reduced STJ ROM. Some pain with ROM of the ankle on the left.  Neurological: Sensation intact to light touch.   Assessment:   1. Arthritis of left subtalar joint      Plan:  Patient was evaluated and treated and all questions answered. Discussed subtalar and ankle arthritis with patient  and treatment options.  X-rays reviewed. No acute fractures or dislocations. Degenerative changes noted in the subtalar joint and some mild degenerative changes in the ankle joint.  Discussed NSAIDS, topicals, and possible injections.  Injection offered today procedure below.  Discussed CMO and will consider.  Discussed potential PRP injection in the future.   Procedure: Injection Tendon/Ligament Discussed alternatives, risks, complications and verbal consent was obtained.  Location: Left subtalar joint . Skin Prep: Alcohol. Injectate: 1cc 0.5% marcaine  plain, 1 cc dexamethasone  0.5 cc kenalog   Disposition: Patient tolerated procedure well. Injection site dressed with a band-aid.  Post-injection care was discussed and return precautions discussed.       Asberry Failing, DPM

## 2024-06-02 NOTE — Progress Notes (Unsigned)
 Pt here for his testosterone  injection. Pt receives 200 mg every 2 wks Patient denies chest pain, shortness of breath, dizziness, headaches or palpitations.Injection was given in RUOQ tolerated well by the patient, no redness or swelling noted at the site. (See MAR for injection details) pt advised to RTC in 2 wks (06/17/24) for next testosterone  injection.

## 2024-06-03 ENCOUNTER — Ambulatory Visit (INDEPENDENT_AMBULATORY_CARE_PROVIDER_SITE_OTHER)

## 2024-06-03 VITALS — BP 152/72 | HR 67 | Resp 18 | Ht 65.0 in

## 2024-06-03 DIAGNOSIS — E291 Testicular hypofunction: Secondary | ICD-10-CM | POA: Diagnosis not present

## 2024-06-03 DIAGNOSIS — Z23 Encounter for immunization: Secondary | ICD-10-CM | POA: Diagnosis not present

## 2024-06-08 ENCOUNTER — Ambulatory Visit

## 2024-06-08 VITALS — Ht 65.5 in | Wt 240.0 lb

## 2024-06-08 DIAGNOSIS — Z0000 Encounter for general adult medical examination without abnormal findings: Secondary | ICD-10-CM | POA: Diagnosis not present

## 2024-06-08 NOTE — Patient Instructions (Signed)
  Jonathan Yu , Thank you for taking time to come for your Medicare Wellness Visit. I appreciate your ongoing commitment to your health goals. Please review the following plan we discussed and let me know if I can assist you in the future.   These are the goals we discussed:  Goals       Patient Stated (pt-stated)      Would like to loose about 50 lbs.      Patient Stated (pt-stated)      Patient would like to get his weight to 200lbs      Patient Stated (pt-stated)      Patient stated that he would like to get back to 197 lbs.      Patient Stated      Patient states he would like to lose some weight.       Quit smoking / using tobacco      Weight (lb) < 197 lb (89.4 kg)      Starting 10/23/16, I will attempt to decrease my current weight to reach my goal weight of 197 lb. I would also like to increase the mobility of my ankle and both my shoulders.         This is a list of the screening recommended for you and due dates:  Health Maintenance  Topic Date Due   COVID-19 Vaccine (3 - Pfizer risk series) 12/08/2019   Eye exam for diabetics  01/29/2024   Yearly kidney function blood test for diabetes  10/05/2024   Hemoglobin A1C  10/06/2024   Yearly kidney health urinalysis for diabetes  04/05/2025   Complete foot exam   04/05/2025   Medicare Annual Wellness Visit  06/08/2025   Colon Cancer Screening  11/02/2028   DTaP/Tdap/Td vaccine (3 - Td or Tdap) 05/11/2031   Pneumococcal Vaccine for age over 54  Completed   Flu Shot  Completed   Hepatitis C Screening  Completed   Zoster (Shingles) Vaccine  Completed   Meningitis B Vaccine  Aged Out

## 2024-06-08 NOTE — Progress Notes (Signed)
 Subjective:   Jonathan Yu is a 73 y.o. male who presents for Medicare Annual/Subsequent preventive examination.  Visit Complete: Virtual I connected with  Denorris Dougan on 06/08/24 by a audio enabled telemedicine application and verified that I am speaking with the correct person using two identifiers.  Patient Location: Home  Provider Location: Office/Clinic  I discussed the limitations of evaluation and management by telemedicine. The patient expressed understanding and agreed to proceed.  Vital Signs: Because this visit was a virtual/telehealth visit, some criteria may be missing or patient reported. Any vitals not documented were not able to be obtained and vitals that have been documented are patient reported.  Patient Medicare AWV questionnaire was completed by the patient on n/a; I have confirmed that all information answered by patient is correct and no changes since this date.  Cardiac Risk Factors include: advanced age (>39men, >12 women);male gender;obesity (BMI >30kg/m2);smoking/ tobacco exposure;hypertension;diabetes mellitus;dyslipidemia     Objective:    Today's Vitals   06/08/24 1458  Weight: 240 lb (108.9 kg)  Height: 5' 5.5 (1.664 m)   Body mass index is 39.33 kg/m.     06/08/2024    3:10 PM 07/03/2023    1:10 PM 07/02/2023    8:45 PM 07/02/2023    2:43 PM 05/21/2023    5:32 PM 05/06/2023    1:39 PM 04/29/2023   12:36 PM  Advanced Directives  Does Patient Have a Medical Advance Directive? No No No No Yes Yes No  Type of Advance Directive     Living will Living will   Does patient want to make changes to medical advance directive?     No - Patient declined No - Patient declined   Would patient like information on creating a medical advance directive? No - Patient declined No - Patient declined No - Patient declined No - Patient declined   No - Patient declined    Current Medications (verified) Outpatient Encounter Medications as of 06/08/2024   Medication Sig   AMBULATORY NON FORMULARY MEDICATION Continue CPAP at current settings.  Please provide new supplies. Kimber Morita phone number [phone]   AMBULATORY NON FORMULARY MEDICATION Compression stockings.  Vive size LGM Leg swelling. Disp 1 pair 99 refil.   AMBULATORY NON FORMULARY MEDICATION Please provide:  12 inch grab bar x2 Trapeze bar to assist with getting out of bed.  Dx: R53.81, Z90.49, K83.9   AMBULATORY NON FORMULARY MEDICATION Please provide electric lift chair for patient. Dx: G20.C, R26.81   anastrozole  (ARIMIDEX ) 1 MG tablet TAKE 1 TABLET EVERY DAY   APPLE CIDER VINEGAR PO Take 1 tablet by mouth See admin instructions. Chew 1 gummie by mouth once a day   atorvastatin  (LIPITOR) 40 MG tablet TAKE 1 TABLET EVERY DAY   Azelaic Acid  15 % cream Apply 1 application topically 2 (two) times daily.   busPIRone  (BUSPAR ) 5 MG tablet Take 1 tablet (5 mg total) by mouth 3 (three) times daily as needed. May take an optional 4th dose in a day if needed.   Carbidopa -Levodopa  ER (SINEMET  CR) 25-100 MG tablet controlled release Take 2 tablets by mouth See admin instructions. Take 2 tablets by mouth upon awakening, midday, and at bedtime   Cholecalciferol  (VITAMIN D3) 50 MCG (2000 UT) TABS Take 2,000 Units by mouth every evening.   fluticasone (FLONASE) 50 MCG/ACT nasal spray Place 1 spray into both nostrils at bedtime as needed for allergies or rhinitis (or nasal congestion).   loratadine  (CLARITIN ) 10 MG tablet Take 10  mg by mouth daily.   losartan  (COZAAR ) 50 MG tablet TAKE 1 TABLET EVERY DAY WITH BREAKFAST   metFORMIN  (GLUCOPHAGE -XR) 500 MG 24 hr tablet TAKE 1 TABLET TWICE DAILY WITH MEALS   Misc. Devices (FLEX THERAPY) MISC by Does not apply route.   mometasone  (ELOCON ) 0.1 % cream Apply 1 Application topically daily.   Multiple Vitamin (MULTIVITAMIN WITH MINERALS) TABS tablet Take 1 tablet by mouth daily with breakfast.   NON FORMULARY Take 2 capsules by mouth See admin  instructions. Yoli Alkalete - Calcium  and Magnesium  Supplement capsules- Take 2 capsules by mouth once a day   polyethylene glycol (MIRALAX  / GLYCOLAX ) 17 g packet Take 17 g by mouth daily.   PRESCRIPTION MEDICATION CPAP- At bedtime   Probiotic Product (PROBIOTIC PO) Take 2 capsules by mouth daily.   rOPINIRole  (REQUIP ) 2 MG tablet TAKE 1 TABLET THREE TIMES DAILY   solifenacin  (VESICARE ) 10 MG tablet Take 1 tablet (10 mg total) by mouth daily.   tadalafil  (CIALIS ) 5 MG tablet Take 1 tablet (5 mg total) by mouth daily.   Testosterone  Cypionate 200 MG/ML SOLN Inject 200 mg into the muscle every 14 (fourteen) days.   traMADol  (ULTRAM ) 50 MG tablet Take 1 tablet (50 mg total) by mouth every 8 (eight) hours as needed for moderate pain (pain score 4-6).   triamcinolone  cream (KENALOG ) 0.1 % Apply 1 Application topically daily as needed (flares).   glipiZIDE  (GLUCOTROL ) 5 MG tablet Take 1 tablet (5 mg total) by mouth daily before breakfast. (Patient not taking: Reported on 06/08/2024)   Facility-Administered Encounter Medications as of 06/08/2024  Medication   testosterone  cypionate (DEPOTESTOSTERONE CYPIONATE) injection 200 mg    Allergies (verified) Sesame oil, Clonazepam , Tape, Percocet [oxycodone -acetaminophen ], and Topamax  [topiramate ]   History: Past Medical History:  Diagnosis Date   Allergy    Anxiety    Asthma    Cataract    DDD (degenerative disc disease), lumbar    GERD (gastroesophageal reflux disease)    Hyperlipidemia    no meds since losing 106# 2 yr ago (10/06/2013)   Hypertension 08/12/2010   no meds since losing 106# 2 yr ago (10/06/2013)   Neuromuscular disorder (HCC)    OSA on CPAP    don't wear mask much since losing 106# 2 yr ago (10/06/2013)   Parkinson's disease (HCC)    Sleep apnea    Type II diabetes mellitus (HCC)    no meds since losing 106# 2 yr ago (10/06/2013)   Walking pneumonia 08/12/1966   Past Surgical History:  Procedure Laterality Date    ANKLE FRACTURE SURGERY Left 08/13/1971   ANKLE RECONSTRUCTION Right 05/13/2011   BILIARY STENT PLACEMENT N/A 03/18/2023   Procedure: BILIARY STENT PLACEMENT;  Surgeon: Rosalie Kitchens, MD;  Location: [facility];  Service: Gastroenterology;  Laterality: N/A;   CARPAL TUNNEL RELEASE Bilateral 08/13/1999   CHOLECYSTECTOMY N/A 03/14/2023   Procedure: LAPAROSCOPIC SUBTOTAL CHOLECYSTECTOMY WITH ICG DYE;  Surgeon: Teresa Lonni HERO, MD;  Location: [facility];  Service: General;  Laterality: N/A;   ERCP N/A 03/18/2023   Procedure: ENDOSCOPIC RETROGRADE CHOLANGIOPANCREATOGRAPHY (ERCP);  Surgeon: Rosalie Kitchens, MD;  Location: [facility];  Service: Gastroenterology;  Laterality: N/A;   ESOPHAGOGASTRODUODENOSCOPY (EGD) WITH PROPOFOL  N/A 04/29/2023   Procedure: ESOPHAGOGASTRODUODENOSCOPY (EGD) WITH PROPOFOL ;  Surgeon: Rosalie Kitchens, MD;  Location: [facility];  Service: Gastroenterology;  Laterality: N/A;   FRACTURE SURGERY     GASTROINTESTINAL STENT REMOVAL N/A 04/29/2023   Procedure: GASTROINTESTINAL STENT REMOVAL;  Surgeon: Rosalie Kitchens, MD;  Location: [facility];  Service: Gastroenterology;  Laterality: N/A;   HARDWARE REMOVAL Right 10/07/2013   Procedure: RIGHT ANKLE HARDWARE REMOVAL;  Surgeon: Jerona LULLA Sage, MD;  Location: [facility];  Service: Orthopedics;  Laterality: Right;   I & D EXTREMITY Right 11/18/2013   Procedure: IRRIGATION AND DEBRIDEMENT EXTREMITY;  Surgeon: Jerona LULLA Sage, MD;  Location: [facility];  Service: Orthopedics;  Laterality: Right;  Irrigation and Debridement Right Fibula , Place Antibiotic Beads and VAC   JOINT REPLACEMENT     LIPOMA EXCISION N/A 07/03/2023   Procedure: IRRIGATION AND DEBRIDEMENT OF ABDOMINAL WALL MASS;  Surgeon: Signe Mitzie LABOR, MD;  Location: [facility];  Service: General;  Laterality: N/A;   NASAL SEPTUM SURGERY  08/12/1980   ORIF ANKLE FRACTURE Right 09/08/2013   Procedure: REPAIR SYNDESMOSIS DISRUPTION RIGHT ANKLE;  Surgeon: Jerona LULLA Sage, MD;  Location: [facility];   Service: Orthopedics;  Laterality: Right;   SHOULDER ARTHROSCOPY W/ ROTATOR CUFF REPAIR Right 2006; 2009   SPHINCTEROTOMY  03/18/2023   Procedure: SPHINCTEROTOMY;  Surgeon: Rosalie Kitchens, MD;  Location: [facility];  Service: Gastroenterology;;   TONSILLECTOMY  08/12/1957   TOTAL ANKLE ARTHROPLASTY Right 08/18/2013   Procedure: TOTAL ANKLE ARTHOPLASTY;  Surgeon: Jerona LULLA Sage, MD;  Location: [facility];  Service: Orthopedics;  Laterality: Right;  Right Total Ankle Arthroplasty, Revision Fibular Fracture   URETERAL STENT PLACEMENT  ~ 2010 X 2   Family History  Problem Relation Age of Onset   Cancer Mother 62       Lung   Alzheimer's disease Father    Parkinson's disease Father    Early death Sister    Scoliosis Sister    Cleft lip Sister    Social History   Socioeconomic History   Marital status: Divorced    Spouse name: Not on file   Number of children: 6   Years of education: 14   Highest education level: Some college, no degree  Occupational History   Occupation: sales/ DJ    Comment: part time  Tobacco Use   Smoking status: Some Days    Types: Pipe, Cigars   Smokeless tobacco: Never   Tobacco comments:    Just a few cigars per year, especially during football season.  Vaping Use   Vaping status: Never Used  Substance and Sexual Activity   Alcohol use: Not Currently    Comment: 1-2 drinks per year.   Drug use: No   Sexual activity: Not Currently    Birth control/protection: Abstinence  Other Topics Concern   Not on file  Social History Narrative   Lives with his son and his daughter. He does Hotel Manager, enjoys singing, music and yard work (when he can).    Social Drivers of Corporate Investment Banker Strain: Low Risk  (06/08/2024)   Overall Financial Resource Strain (CARDIA)    Difficulty of Paying Living Expenses: Not hard at all  Food Insecurity: No Food Insecurity (06/08/2024)   Hunger Vital Sign    Worried About Running Out of Food in the Last Year: Never true     Ran Out of Food in the Last Year: Never true  Transportation Needs: No Transportation Needs (06/08/2024)   PRAPARE - Administrator, Civil Service (Medical): No    Lack of Transportation (Non-Medical): No  Physical Activity: Insufficiently Active (06/08/2024)   Exercise Vital Sign    Days of Exercise per Week: 3 days    Minutes of Exercise per Session: 20 min  Stress: No Stress Concern Present (06/08/2024)   [employer] of Occupational Health - Occupational Stress Questionnaire    Feeling of Stress: Only a little  Social Connections: Moderately Isolated (06/08/2024)   Social Connection and Isolation Panel    Frequency of Communication with Friends and Family: More than three times a week    Frequency of Social Gatherings with Friends and Family: More than three times a week    Attends Religious Services: More than 4 times per year    Active Member of [employer] or Organizations: No    Attends Banker Meetings: Never    Marital Status: Divorced    Tobacco Counseling Ready to quit: Not Answered Counseling given: Not Answered Tobacco comments: Just a few cigars per year, especially during football season.   Clinical Intake:  Pre-visit preparation completed: Yes  Pain : No/denies pain     BMI - recorded: 39.33 Nutritional Status: BMI > 30  Obese Nutritional Risks: None Diabetes: Yes CBG done?: No Did pt. bring in CBG monitor from home?: No  How often do you need to have someone help you when you read instructions, pamphlets, or other written materials from your doctor or pharmacy?: 1 - Never What is the last grade level you completed in school?: 14  Interpreter Needed?: No      Activities of Daily Living    06/08/2024    2:59 PM 07/02/2023    8:45 PM  In your present state of health, do you have any difficulty performing the following activities:  Hearing? 0 0  Vision? 0 0  Difficulty concentrating or making decisions? 0 0  Walking or  climbing stairs? 1   Dressing or bathing? 0   Doing errands, shopping? 0 0  Preparing Food and eating ? N   Using the Toilet? N   In the past six months, have you accidently leaked urine? Y   Do you have problems with loss of bowel control? N   Managing your Medications? N   Managing your Finances? N   Housekeeping or managing your Housekeeping? N     Patient Care Team: Alvia Bring, DO as PCP - General (Family Medicine) Oneita Tresea DEL, OD (Optometry) Rosalie Kitchens, MD as Consulting Physician (Gastroenterology) Teresa Lonni HERO, MD as Consulting Physician (General Surgery) Matilda Senior, MD as Consulting Physician (Urology) Joya Stabs, DPM as Consulting Physician (Podiatry) Buck Saucer, MD as Attending Physician (Neurology)  Indicate any recent Medical Services you may have received from other than [facility] providers in the past year (date may be approximate).     Assessment:   This is a routine wellness examination for Jonathan Yu.  Hearing/Vision screen No results found.   Goals Addressed             This Visit's Progress    Patient Stated       Patient states he would like to lose some weight.        Depression Screen    06/08/2024    3:09 PM 04/05/2024   10:58 AM 10/29/2023    1:47 PM 05/06/2023    1:43 PM 03/27/2023   10:35 AM 12/26/2022    9:53 AM 05/03/2022    1:02 PM  PHQ 2/9 Scores  PHQ - 2 Score 0 0 0 0 3 0 0  PHQ- 9 Score    5 6      Fall Risk    06/08/2024    3:11 PM 04/05/2024   10:58 AM 10/29/2023  1:46 PM 05/06/2023    1:42 PM 05/05/2023    2:50 PM  Fall Risk   Falls in the past year? 1 0 1 1 1   Number falls in past yr: 1 0 0 1 1  Injury with Fall? 0 0 0 0 0  Risk for fall due to : History of fall(s) No Fall Risks History of fall(s) History of fall(s)   Follow up Falls evaluation completed Falls evaluation completed Falls evaluation completed Falls evaluation completed     MEDICARE RISK AT HOME: Medicare Risk at Home Any stairs  in or around the home?: Yes If so, are there any without handrails?: Yes Home free of loose throw rugs in walkways, pet beds, electrical cords, etc?: No Adequate lighting in your home to reduce risk of falls?: Yes Life alert?: No Use of a cane, walker or w/c?: No Grab bars in the bathroom?: Yes Shower chair or bench in shower?: Yes Elevated toilet seat or a handicapped toilet?: No  TIMED UP AND GO:  Was the test performed?  No    Cognitive Function:    10/23/2016   10:24 AM  MMSE - Mini Mental State Exam  Not completed: --        06/08/2024    3:13 PM 05/06/2023    1:46 PM 05/03/2022    1:07 PM 04/23/2021   10:35 AM  6CIT Screen  What Year? 0 points 0 points 0 points 0 points  What month? 0 points 0 points 0 points 0 points  What time? 0 points 0 points 0 points 0 points  Count back from 20 0 points 0 points 0 points 0 points  Months in reverse 0 points 0 points 0 points 0 points  Repeat phrase 0 points 0 points 2 points 0 points  Total Score 0 points 0 points 2 points 0 points    Immunizations Immunization History  Administered Date(s) Administered   Fluad Quad(high Dose 65+) 04/13/2020, 05/31/2021   INFLUENZA, HIGH DOSE SEASONAL PF 05/07/2022, 05/22/2023, 06/03/2024   Influenza Split 07/16/2012   Influenza Whole 04/25/2011   Influenza,inj,Quad PF,6+ Mos 03/23/2014, 06/10/2014, 09/29/2015, 03/28/2016, 04/16/2017, 04/30/2018, 04/22/2019   Influenza-Unspecified 05/12/2013   PFIZER(Purple Top)SARS-COV-2 Vaccination 10/15/2019, 11/10/2019   Pneumococcal Conjugate-13 06/16/2009   Pneumococcal Polysaccharide-23 08/14/2018   Tdap 04/25/2011, 05/10/2021   Zoster Recombinant(Shingrix) 05/10/2021, 03/11/2022   Zoster, Live 10/11/2014    TDAP status: Up to date  Flu Vaccine status: Up to date  Pneumococcal vaccine status: Up to date  Covid-19 vaccine status: Declined, Education has been provided regarding the importance of this vaccine but patient still declined.  Advised may receive this vaccine at local pharmacy or Health Dept.or vaccine clinic. Aware to provide a copy of the vaccination record if obtained from local pharmacy or Health Dept. Verbalized acceptance and understanding.  Qualifies for Shingles Vaccine? Yes   Zostavax completed Yes   Shingrix Completed?: Yes  Screening Tests Health Maintenance  Topic Date Due   COVID-19 Vaccine (3 - Pfizer risk series) 12/08/2019   OPHTHALMOLOGY EXAM  01/29/2024   Diabetic kidney evaluation - eGFR measurement  10/05/2024   HEMOGLOBIN A1C  10/06/2024   Diabetic kidney evaluation - Urine ACR  04/05/2025   FOOT EXAM  04/05/2025   Medicare Annual Wellness (AWV)  06/08/2025   Colonoscopy  11/02/2028   DTaP/Tdap/Td (3 - Td or Tdap) 05/11/2031   Pneumococcal Vaccine: 50+ Years  Completed   Influenza Vaccine  Completed   Hepatitis C Screening  Completed  Zoster Vaccines- Shingrix  Completed   Meningococcal B Vaccine  Aged Out    Health Maintenance  Health Maintenance Due  Topic Date Due   COVID-19 Vaccine (3 - Pfizer risk series) 12/08/2019   OPHTHALMOLOGY EXAM  01/29/2024    Colorectal cancer screening: Type of screening: Colonoscopy. Completed 11/03/2023. Repeat every 5 years  Lung Cancer Screening: (Low Dose CT Chest recommended if Age 43-80 years, 20 pack-year currently smoking OR have quit w/in 15years.) does not qualify.   Lung Cancer Screening Referral: n/a  Additional Screening:  Hepatitis C Screening: does qualify; Completed 11/20/2013  Vision Screening: Recommended annual ophthalmology exams for early detection of glaucoma and other disorders of the eye. Is the patient up to date with their annual eye exam?  Yes  Who is the provider or what is the name of the office in which the patient attends annual eye exams? Dr Tresea Meissner If pt is not established with a provider, would they like to be referred to a provider to establish care? N/a.   Dental Screening: Recommended annual dental  exams for proper oral hygiene  Diabetic Foot Exam: Diabetic Foot Exam: Completed 04/05/2024  Community Resource Referral / Chronic Care Management: CRR required this visit?  No   CCM required this visit?  No     Plan:     I have personally reviewed and noted the following in the patient's chart:   Medical and social history Use of alcohol, tobacco or illicit drugs  Current medications and supplements including opioid prescriptions. Patient is not currently taking opioid prescriptions. Functional ability and status Nutritional status Physical activity Advanced directives List of other physicians Hospitalizations # 1, surgeries # 1, and ER # 1 visits in previous 12 months Vitals Screenings to include cognitive, depression, and falls Referrals and appointments  In addition, I have reviewed and discussed with patient certain preventive protocols, quality metrics, and best practice recommendations. A written personalized care plan for preventive services as well as general preventive health recommendations were provided to patient.     Bonny Jon Mayor, CMA   06/08/2024   After Visit Summary: (MyChart) Due to this being a telephonic visit, the after visit summary with patients personalized plan was offered to patient via MyChart   Nurse Notes:   Jonathan Yu is a 73 y.o. male patient of Alvia Bring, DO who had a Medicare Annual Wellness Visit today via telephone. Jonathan Yu is working part-time and lives with their son and daughter. he has 6 children. He reports that he is socially active and does interact with friends/family regularly. He is moderately physically active and enjoys Jonathan Yu, enjoys singing, music and yard work (when he can).

## 2024-06-17 ENCOUNTER — Ambulatory Visit (INDEPENDENT_AMBULATORY_CARE_PROVIDER_SITE_OTHER)

## 2024-06-17 VITALS — BP 138/83 | HR 80 | Resp 20 | Ht 65.5 in | Wt 240.4 lb

## 2024-06-17 DIAGNOSIS — E291 Testicular hypofunction: Secondary | ICD-10-CM | POA: Diagnosis not present

## 2024-06-17 MED ORDER — TESTOSTERONE CYPIONATE 200 MG/ML IM SOLN
200.0000 mg | INTRAMUSCULAR | Status: AC
  Administered 2024-06-30 – 2024-09-09 (×6): 200 mg via INTRAMUSCULAR

## 2024-06-17 NOTE — Progress Notes (Signed)
 Pt here for his testosterone  injection. Pt receives 200 mg every 2 wks Patient denies chest pain, shortness of breath, dizziness, headaches or palpitations.   Injection was given in RUOQ tolerated well by the patient, no redness or swelling noted at the site. (See MAR for injection details) pt advised to RTC in 2 wks (07/01/24) for next testosterone  injection.

## 2024-06-22 DIAGNOSIS — I87309 Chronic venous hypertension (idiopathic) without complications of unspecified lower extremity: Secondary | ICD-10-CM | POA: Diagnosis not present

## 2024-06-22 DIAGNOSIS — Z9989 Dependence on other enabling machines and devices: Secondary | ICD-10-CM | POA: Diagnosis not present

## 2024-06-22 DIAGNOSIS — N3941 Urge incontinence: Secondary | ICD-10-CM | POA: Diagnosis not present

## 2024-06-22 DIAGNOSIS — F411 Generalized anxiety disorder: Secondary | ICD-10-CM | POA: Diagnosis not present

## 2024-06-22 DIAGNOSIS — G2581 Restless legs syndrome: Secondary | ICD-10-CM | POA: Diagnosis not present

## 2024-06-22 DIAGNOSIS — M519 Unspecified thoracic, thoracolumbar and lumbosacral intervertebral disc disorder: Secondary | ICD-10-CM | POA: Diagnosis not present

## 2024-06-22 DIAGNOSIS — N400 Enlarged prostate without lower urinary tract symptoms: Secondary | ICD-10-CM | POA: Diagnosis not present

## 2024-06-22 DIAGNOSIS — E1136 Type 2 diabetes mellitus with diabetic cataract: Secondary | ICD-10-CM | POA: Diagnosis not present

## 2024-06-22 DIAGNOSIS — G20A1 Parkinson's disease without dyskinesia, without mention of fluctuations: Secondary | ICD-10-CM | POA: Diagnosis not present

## 2024-06-22 DIAGNOSIS — M1990 Unspecified osteoarthritis, unspecified site: Secondary | ICD-10-CM | POA: Diagnosis not present

## 2024-06-22 DIAGNOSIS — G709 Myoneural disorder, unspecified: Secondary | ICD-10-CM | POA: Diagnosis not present

## 2024-06-22 DIAGNOSIS — K760 Fatty (change of) liver, not elsewhere classified: Secondary | ICD-10-CM | POA: Diagnosis not present

## 2024-06-22 DIAGNOSIS — L400 Psoriasis vulgaris: Secondary | ICD-10-CM | POA: Diagnosis not present

## 2024-06-22 DIAGNOSIS — J45909 Unspecified asthma, uncomplicated: Secondary | ICD-10-CM | POA: Diagnosis not present

## 2024-06-22 DIAGNOSIS — I700 Atherosclerosis of aorta: Secondary | ICD-10-CM | POA: Diagnosis not present

## 2024-06-22 DIAGNOSIS — I10 Essential (primary) hypertension: Secondary | ICD-10-CM | POA: Diagnosis not present

## 2024-06-22 DIAGNOSIS — E11620 Type 2 diabetes mellitus with diabetic dermatitis: Secondary | ICD-10-CM | POA: Diagnosis not present

## 2024-06-22 DIAGNOSIS — E785 Hyperlipidemia, unspecified: Secondary | ICD-10-CM | POA: Diagnosis not present

## 2024-06-22 DIAGNOSIS — K219 Gastro-esophageal reflux disease without esophagitis: Secondary | ICD-10-CM | POA: Diagnosis not present

## 2024-06-25 ENCOUNTER — Other Ambulatory Visit: Payer: Self-pay | Admitting: Family Medicine

## 2024-06-25 DIAGNOSIS — E119 Type 2 diabetes mellitus without complications: Secondary | ICD-10-CM

## 2024-06-25 DIAGNOSIS — N400 Enlarged prostate without lower urinary tract symptoms: Secondary | ICD-10-CM

## 2024-06-28 NOTE — Progress Notes (Unsigned)
   Subjective:    Patient ID: Jonathan Yu, male    DOB: Jan 18, 1951, 73 y.o.   MRN: [id number]  HPI  Pt here for his testosterone  injection. Pt receives 200 mg every 2 wks Patient denies chest pain, shortness of breath, dizziness, headaches or palpitations.  Review of Systems     Objective:   Physical Exam        Assessment & Plan:   Injection was given in RUOQ tolerated well by the patient, no redness or swelling noted at the site. (See MAR for injection details) pt advised to RTC in 2 wks (07/14/24) for next testosterone  injection.

## 2024-06-30 ENCOUNTER — Ambulatory Visit

## 2024-06-30 VITALS — BP 126/61 | HR 73 | Resp 18 | Ht 65.0 in | Wt 247.1 lb

## 2024-06-30 DIAGNOSIS — E291 Testicular hypofunction: Secondary | ICD-10-CM

## 2024-07-01 ENCOUNTER — Ambulatory Visit (INDEPENDENT_AMBULATORY_CARE_PROVIDER_SITE_OTHER): Admitting: Family Medicine

## 2024-07-01 ENCOUNTER — Encounter: Payer: Self-pay | Admitting: Family Medicine

## 2024-07-01 ENCOUNTER — Ambulatory Visit (INDEPENDENT_AMBULATORY_CARE_PROVIDER_SITE_OTHER)

## 2024-07-01 VITALS — BP 126/76 | HR 75 | Ht 65.0 in | Wt 249.1 lb

## 2024-07-01 DIAGNOSIS — R0989 Other specified symptoms and signs involving the circulatory and respiratory systems: Secondary | ICD-10-CM

## 2024-07-01 DIAGNOSIS — R059 Cough, unspecified: Secondary | ICD-10-CM | POA: Diagnosis not present

## 2024-07-01 DIAGNOSIS — R052 Subacute cough: Secondary | ICD-10-CM | POA: Diagnosis not present
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
## 2024-07-01 NOTE — Assessment & Plan Note (Signed)
 Chest x-ray ordered..  Does have some occasional dysphagia but denies reflux symptoms.  Question possible aspiration and can consider swallow study.

## 2024-07-05 ENCOUNTER — Ambulatory Visit: Admitting: Urology

## 2024-07-07 ENCOUNTER — Ambulatory Visit: Payer: Self-pay | Admitting: Family Medicine
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
## 2024-07-12 ENCOUNTER — Ambulatory Visit: Admitting: Urology

## 2024-07-12 VITALS — BP 140/80 | HR 86 | Ht 65.0 in | Wt 247.0 lb

## 2024-07-12 DIAGNOSIS — E291 Testicular hypofunction: Secondary | ICD-10-CM | POA: Diagnosis not present

## 2024-07-12 DIAGNOSIS — N4883 Acquired buried penis: Secondary | ICD-10-CM | POA: Diagnosis not present

## 2024-07-12 DIAGNOSIS — R972 Elevated prostate specific antigen [PSA]: Secondary | ICD-10-CM

## 2024-07-12 DIAGNOSIS — R351 Nocturia: Secondary | ICD-10-CM | POA: Diagnosis not present

## 2024-07-12 DIAGNOSIS — N3941 Urge incontinence: Secondary | ICD-10-CM | POA: Diagnosis not present

## 2024-07-12 DIAGNOSIS — N210 Calculus in bladder: Secondary | ICD-10-CM | POA: Diagnosis not present

## 2024-07-12 DIAGNOSIS — R350 Frequency of micturition: Secondary | ICD-10-CM

## 2024-07-12 DIAGNOSIS — N138 Other obstructive and reflux uropathy: Secondary | ICD-10-CM | POA: Diagnosis not present

## 2024-07-12 DIAGNOSIS — N401 Enlarged prostate with lower urinary tract symptoms: Secondary | ICD-10-CM

## 2024-07-12 LAB — URINALYSIS, ROUTINE W REFLEX MICROSCOPIC
Bilirubin, UA: NEGATIVE
Leukocytes,UA: NEGATIVE
Nitrite, UA: NEGATIVE
RBC, UA: NEGATIVE
Specific Gravity, UA: 1.025 (ref 1.005–1.030)
Urobilinogen, Ur: 0.2 mg/dL (ref 0.2–1.0)
pH, UA: 5.5 (ref 5.0–7.5)

## 2024-07-12 LAB — MICROSCOPIC EXAMINATION: Bacteria, UA: NONE SEEN

## 2024-07-12 MED ORDER — SOLIFENACIN SUCCINATE 10 MG PO TABS: 10.0000 mg | ORAL_TABLET | Freq: Every day | ORAL | 11 refills | Status: AC

## 2024-07-15 ENCOUNTER — Ambulatory Visit: Admitting: Podiatry

## 2024-07-15 ENCOUNTER — Encounter: Payer: Self-pay | Admitting: Podiatry

## 2024-07-15 ENCOUNTER — Ambulatory Visit

## 2024-07-15 VITALS — BP 118/78 | HR 71 | Resp 18 | Ht 65.0 in | Wt 247.0 lb

## 2024-07-15 DIAGNOSIS — E291 Testicular hypofunction: Secondary | ICD-10-CM | POA: Diagnosis not present

## 2024-07-15 DIAGNOSIS — M19072 Primary osteoarthritis, left ankle and foot: Secondary | ICD-10-CM

## 2024-07-15 MED ORDER — DEXAMETHASONE SODIUM PHOSPHATE 120 MG/30ML IJ SOLN
4.0000 mg | Freq: Once | INTRAMUSCULAR | Status: AC
  Administered 2024-07-15: 4 mg via INTRA_ARTICULAR

## 2024-07-15 MED ORDER — LORATADINE 10 MG PO TABS: 10.0000 mg | ORAL_TABLET | Freq: Every day | ORAL | 1 refills | Status: AC

## 2024-07-15 MED ORDER — TRIAMCINOLONE ACETONIDE 10 MG/ML IJ SUSP
2.5000 mg | Freq: Once | INTRAMUSCULAR | Status: AC
  Administered 2024-07-15: 2.5 mg via INTRA_ARTICULAR

## 2024-07-15 NOTE — Progress Notes (Signed)
   Subjective:    Patient ID: Jonathan Yu, male    DOB: [birth year], 73 y.o.   MRN: [id number]  HPI  Pt here for his testosterone  injection. Pt receives 200 mg every 2 wks Patient denies chest pain, shortness of breath, dizziness, headaches or palpitations. Patient also stated he feels like he has fluid in his left ear and also still having some drainage. Offer to make him another appointment, he will call us  back to schedule if it worsens. Also asking for Claritin  to be refilled. Sent in today  Review of Systems     Objective:   Physical Exam        Assessment & Plan:   Injection was given in RUOQ tolerated well by the patient, no redness or swelling noted at the site. pt advised to RTC in 2 wks (07/29/24) for next testosterone  injection.

## 2024-07-15 NOTE — Progress Notes (Signed)
  Subjective:  Patient ID: Jonathan Yu, male    DOB: Apr 10, 1951,   MRN: [id number]  Chief Complaint  Patient presents with   Arthritis    It feels a little better.    73 y.o. male presents for concern of left subtalar arthritis.  Relates the last injection worked for about 4 weeks so getting m ore and more.  He relates he is  ready for another injection.  Patient is diabetic and last A1c was  Lab Results  Component Value Date   HGBA1C 5.7 04/05/2024   .   PCP:  Alvia Bring, DO    . Denies any other pedal complaints. Denies n/v/f/c.   Past Medical History:  Diagnosis Date   Allergy    Anxiety    Asthma    Cataract    DDD (degenerative disc disease), lumbar    GERD (gastroesophageal reflux disease)    Hyperlipidemia    no meds since losing 106# 2 yr ago (10/06/2013)   Hypertension 08/12/2010   no meds since losing 106# 2 yr ago (10/06/2013)   Neuromuscular disorder (HCC)    OSA on CPAP    don't wear mask much since losing 106# 2 yr ago (10/06/2013)   Parkinson's disease (HCC)    Sleep apnea    Type II diabetes mellitus (HCC)    no meds since losing 106# 2 yr ago (10/06/2013)   Walking pneumonia 08/12/1966    Objective:  Physical Exam: Vascular: DP/PT pulses 2/4 bilateral. CFT <3 seconds. Normal hair growth on digits. No edema.  Skin. No lacerations or abrasions bilateral feet. Musculoskeletal: MMT 5/5 bilateral lower extremities in DF, PF, Inversion and Eversion. Deceased ROM in DF of ankle joint. Tender to lateral subtalar joint area and some pain medially. Reduced STJ ROM. Some pain with ROM of the ankle on the left.  Neurological: Sensation intact to light touch.   Assessment:   1. Arthritis of left subtalar joint       Plan:  Patient was evaluated and treated and all questions answered. Discussed subtalar and ankle arthritis with patient and treatment options.  X-rays reviewed. No acute fractures or dislocations. Degenerative changes noted in the  subtalar joint and some mild degenerative changes in the ankle joint.  Discussed NSAIDS, topicals, and possible injections.  Injection offered today procedure below.  Discussed CMO and will consider.  Discussed potential PRP injection in the future.  Return in 6 weeks for recheck.   Procedure: Injection Tendon/Ligament Discussed alternatives, risks, complications and verbal consent was obtained.  Location: Left subtalar joint . Skin Prep: Alcohol. Injectate: 1cc 0.5% marcaine  plain, 1 cc dexamethasone  0.5 cc kenalog   Disposition: Patient tolerated procedure well. Injection site dressed with a band-aid.  Post-injection care was discussed and return precautions discussed.       Jonathan Yu, DPM

## 2024-07-23 ENCOUNTER — Ambulatory Visit: Admitting: Sports Medicine

## 2024-07-29 ENCOUNTER — Ambulatory Visit

## 2024-07-29 VITALS — BP 123/70 | HR 75 | Resp 18 | Ht 65.0 in | Wt 251.0 lb

## 2024-07-29 DIAGNOSIS — E291 Testicular hypofunction: Secondary | ICD-10-CM | POA: Diagnosis not present

## 2024-07-29 NOTE — Progress Notes (Signed)
° °  Subjective:    Patient ID: Jonathan Yu, male    DOB: 1951-02-27, 73 y.o.   MRN: [id number]  HPI  Pt here for his testosterone  injection. Pt receives 200 mg every 2 wks Patient denies chest pain, shortness of breath, dizziness, headaches or palpitations.     Review of Systems     Objective:   Physical Exam        Assessment & Plan:   Injection was given in LUOQ tolerated well by the patient, no redness or swelling noted at the site. pt advised to RTC in 2 wks (08/12/24) for next testosterone  injection.

## 2024-08-02 NOTE — Telephone Encounter (Signed)
 Pt called returning  message the patient Stated that  he  will not be able to Come in   office today  due to how far he stays. Pt stated if you need to read CPAP he will change his appt to In office , That way he can bring CPAP and power cord   to appt  Appt Changed to in person

## 2024-08-03 ENCOUNTER — Ambulatory Visit: Payer: Medicare HMO | Admitting: Adult Health

## 2024-08-03 ENCOUNTER — Encounter: Payer: Self-pay | Admitting: Adult Health

## 2024-08-03 VITALS — BP 117/76 | HR 77 | Ht 65.0 in | Wt 247.1 lb

## 2024-08-03 DIAGNOSIS — G4733 Obstructive sleep apnea (adult) (pediatric): Secondary | ICD-10-CM

## 2024-08-03 NOTE — Progress Notes (Signed)
 SABRA
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
## 2024-08-03 NOTE — Patient Instructions (Signed)
 Continue using CPAP nightly and greater than 4 hours each night If your symptoms worsen or you develop new symptoms please let us  know.

## 2024-08-04 NOTE — Progress Notes (Signed)
 Community message has been sent to [employer] for pressure and supplies on 08/04/24. DD

## 2024-08-09 NOTE — Progress Notes (Signed)
 Jonathan Yu

## 2024-08-13 ENCOUNTER — Ambulatory Visit (INDEPENDENT_AMBULATORY_CARE_PROVIDER_SITE_OTHER)

## 2024-08-13 VITALS — BP 111/71 | HR 74 | Resp 20 | Ht 65.0 in | Wt 247.0 lb

## 2024-08-13 DIAGNOSIS — E291 Testicular hypofunction: Secondary | ICD-10-CM

## 2024-08-13 NOTE — Progress Notes (Signed)
" ° °  Subjective:    Patient ID: Jonathan Yu, male    DOB: [birth year], 74 y.o.   MRN: [id number]  HPI   Pt here for his testosterone  injection. Pt receives 200 mg every 2 wks Patient denies chest pain, shortness of breath, dizziness, headaches or palpitations.   Review of Systems     Objective:   Physical Exam        Assessment & Plan:   Injection was given in RUOQ tolerated well by the patient, no redness or swelling noted at the site. pt advised to RTC in 2 wks (08/27/24) for next testosterone  injection.  "

## 2024-08-25 NOTE — Progress Notes (Signed)
" ° °  Subjective:    Patient ID: Jonathan Yu, male    DOB: 1951/05/09, 74 y.o.   MRN: [id number]  HPI  Pt here for his testosterone  injection. Pt receives 200 mg every 2 wks Patient denies chest pain, shortness of breath, dizziness, headaches, mood changes, or palpitations.   Review of Systems     Objective:   Physical Exam        Assessment & Plan:   Injection was given in LUOQ tolerated well by the patient, no redness or swelling noted at the site. pt advised to RTC in 2 wks (09/09/24) for next testosterone  injection.  "

## 2024-08-26 ENCOUNTER — Encounter: Payer: Self-pay | Admitting: Podiatry

## 2024-08-26 ENCOUNTER — Ambulatory Visit: Admitting: Podiatry

## 2024-08-26 ENCOUNTER — Ambulatory Visit (INDEPENDENT_AMBULATORY_CARE_PROVIDER_SITE_OTHER)

## 2024-08-26 VITALS — BP 137/72 | HR 78 | Resp 18 | Ht 65.0 in | Wt 251.5 lb

## 2024-08-26 DIAGNOSIS — E291 Testicular hypofunction: Secondary | ICD-10-CM | POA: Diagnosis not present

## 2024-08-26 DIAGNOSIS — M19072 Primary osteoarthritis, left ankle and foot: Secondary | ICD-10-CM

## 2024-08-26 MED ORDER — DEXAMETHASONE SODIUM PHOSPHATE 120 MG/30ML IJ SOLN
4.0000 mg | Freq: Once | INTRAMUSCULAR | Status: AC
  Administered 2024-08-26: 4 mg via INTRA_ARTICULAR

## 2024-08-26 MED ORDER — TRIAMCINOLONE ACETONIDE 10 MG/ML IJ SUSP
2.5000 mg | Freq: Once | INTRAMUSCULAR | Status: AC
  Administered 2024-08-26: 2.5 mg via INTRA_ARTICULAR

## 2024-08-26 NOTE — Progress Notes (Signed)
"  °  Subjective:  Patient ID: Jonathan Yu, male    DOB: 1951-05-16,   MRN: [id number]  Chief Complaint  Patient presents with   Arthritis    It's good, the shot lasted a little longer this time.    74 y.o. male presents for concern of left subtalar arthritis.  Relates the last injection worked for about 6 weeks so getting more and more.  He relates he is  ready for another injection.  Patient is diabetic and last A1c was  Lab Results  Component Value Date   HGBA1C 5.7 04/05/2024   .   PCP:  Jonathan Bring, DO    . Denies any other pedal complaints. Denies n/v/f/c.   Past Medical History:  Diagnosis Date   Allergy    Anxiety    Asthma    Cataract    DDD (degenerative disc disease), lumbar    GERD (gastroesophageal reflux disease)    Hyperlipidemia    no meds since losing 106# 2 yr ago (10/06/2013)   Hypertension 08/12/2010   no meds since losing 106# 2 yr ago (10/06/2013)   Neuromuscular disorder (HCC)    OSA on CPAP    don't wear mask much since losing 106# 2 yr ago (10/06/2013)   Parkinson's disease (HCC)    Sleep apnea    Type II diabetes mellitus (HCC)    no meds since losing 106# 2 yr ago (10/06/2013)   Walking pneumonia 08/12/1966    Objective:  Physical Exam: Vascular: DP/PT pulses 2/4 bilateral. CFT <3 seconds. Normal hair growth on digits. No edema.  Skin. No lacerations or abrasions bilateral feet. Musculoskeletal: MMT 5/5 bilateral lower extremities in DF, PF, Inversion and Eversion. Deceased ROM in DF of ankle joint. Tender to lateral subtalar joint area and some pain medially. Reduced STJ ROM. Some pain with ROM of the ankle on the left.  Neurological: Sensation intact to light touch.   Assessment:   1. Arthritis of left subtalar joint        Plan:  Patient was evaluated and treated and all questions answered. Discussed subtalar and ankle arthritis with patient and treatment options.  X-rays reviewed. No acute fractures or dislocations.  Degenerative changes noted in the subtalar joint and some mild degenerative changes in the ankle joint.  Discussed NSAIDS, topicals, and possible injections.  Injection offered today procedure below.  Discussed CMO and will consider.  Discussed potential PRP injection in the future.  Return in 6 weeks for recheck.   Procedure: Injection Tendon/Ligament Discussed alternatives, risks, complications and verbal consent was obtained.  Location: Left subtalar joint . Skin Prep: Alcohol. Injectate: 1cc 0.5% marcaine  plain, 1 cc dexamethasone  0.5 cc kenalog   Disposition: Patient tolerated procedure well. Injection site dressed with a band-aid.  Post-injection care was discussed and return precautions discussed.       Jonathan Yu, DPM    "

## 2024-09-09 ENCOUNTER — Ambulatory Visit

## 2024-09-09 VITALS — BP 129/71 | HR 79 | Resp 17 | Ht 65.0 in | Wt 247.1 lb

## 2024-09-09 DIAGNOSIS — E291 Testicular hypofunction: Secondary | ICD-10-CM | POA: Diagnosis not present

## 2024-09-09 NOTE — Progress Notes (Signed)
" ° °  Subjective:    Patient ID: Jonathan Yu, male    DOB: 1951/01/17, 74 y.o.   MRN: [id number]  HPI  Pt here for his testosterone  injection. Pt receives 200 mg every 2 wks Patient denies chest pain, shortness of breath, dizziness, headaches, mood changes, or palpitations.   Review of Systems     Objective:   Physical Exam        Assessment & Plan:   Injection was given in RUOQ tolerated well by the patient, no redness or swelling noted at the site. pt advised to RTC in 2 wks (09/23/24) for next testosterone  injection.  "

## 2024-09-23 ENCOUNTER — Ambulatory Visit

## 2024-10-04 ENCOUNTER — Ambulatory Visit: Admitting: Family Medicine

## 2024-10-21 ENCOUNTER — Ambulatory Visit: Admitting: Podiatry

## 2025-06-14 ENCOUNTER — Ambulatory Visit

## 2025-07-13 ENCOUNTER — Ambulatory Visit: Admitting: Urology

## 2025-07-15 ENCOUNTER — Ambulatory Visit: Admitting: Adult Health
